# Patient Record
Sex: Female | Born: 1950 | Race: White | Hispanic: No | Marital: Married | Smoking: Never smoker
Health system: Southern US, Community
[De-identification: ages and names within clinical notes are randomized; demographics above are authoritative.]

## PROBLEM LIST (undated history)

## (undated) DIAGNOSIS — E785 Hyperlipidemia, unspecified: Secondary | ICD-10-CM

## (undated) DIAGNOSIS — E039 Hypothyroidism, unspecified: Secondary | ICD-10-CM

## (undated) DIAGNOSIS — I10 Essential (primary) hypertension: Secondary | ICD-10-CM

## (undated) DIAGNOSIS — N8500 Endometrial hyperplasia, unspecified: Secondary | ICD-10-CM

## (undated) DIAGNOSIS — K649 Unspecified hemorrhoids: Secondary | ICD-10-CM

## (undated) DIAGNOSIS — K222 Esophageal obstruction: Secondary | ICD-10-CM

## (undated) DIAGNOSIS — G4733 Obstructive sleep apnea (adult) (pediatric): Secondary | ICD-10-CM

## (undated) DIAGNOSIS — J45909 Unspecified asthma, uncomplicated: Secondary | ICD-10-CM

## (undated) DIAGNOSIS — E559 Vitamin D deficiency, unspecified: Secondary | ICD-10-CM

## (undated) DIAGNOSIS — K5790 Diverticulosis of intestine, part unspecified, without perforation or abscess without bleeding: Secondary | ICD-10-CM

## (undated) DIAGNOSIS — M25559 Pain in unspecified hip: Secondary | ICD-10-CM

## (undated) DIAGNOSIS — M1990 Unspecified osteoarthritis, unspecified site: Secondary | ICD-10-CM

## (undated) DIAGNOSIS — Z9889 Other specified postprocedural states: Secondary | ICD-10-CM

## (undated) DIAGNOSIS — M25569 Pain in unspecified knee: Secondary | ICD-10-CM

## (undated) DIAGNOSIS — K2970 Gastritis, unspecified, without bleeding: Secondary | ICD-10-CM

## (undated) DIAGNOSIS — E8881 Metabolic syndrome: Secondary | ICD-10-CM

## (undated) DIAGNOSIS — D497 Neoplasm of unspecified behavior of endocrine glands and other parts of nervous system: Secondary | ICD-10-CM

## (undated) DIAGNOSIS — K219 Gastro-esophageal reflux disease without esophagitis: Secondary | ICD-10-CM

## (undated) DIAGNOSIS — R0600 Dyspnea, unspecified: Secondary | ICD-10-CM

## (undated) DIAGNOSIS — M7750 Other enthesopathy of unspecified foot: Secondary | ICD-10-CM

## (undated) DIAGNOSIS — Z87442 Personal history of urinary calculi: Secondary | ICD-10-CM

## (undated) DIAGNOSIS — K449 Diaphragmatic hernia without obstruction or gangrene: Secondary | ICD-10-CM

## (undated) DIAGNOSIS — M79673 Pain in unspecified foot: Secondary | ICD-10-CM

## (undated) DIAGNOSIS — G971 Other reaction to spinal and lumbar puncture: Secondary | ICD-10-CM

## (undated) DIAGNOSIS — D131 Benign neoplasm of stomach: Secondary | ICD-10-CM

## (undated) DIAGNOSIS — R0902 Hypoxemia: Secondary | ICD-10-CM

## (undated) DIAGNOSIS — R112 Nausea with vomiting, unspecified: Secondary | ICD-10-CM

## (undated) HISTORY — DX: Gastritis, unspecified, without bleeding: K29.70

## (undated) HISTORY — DX: Diverticulosis of intestine, part unspecified, without perforation or abscess without bleeding: K57.90

## (undated) HISTORY — DX: Pain in unspecified foot: M79.673

## (undated) HISTORY — DX: Unspecified asthma, uncomplicated: J45.909

## (undated) HISTORY — DX: Neoplasm of unspecified behavior of endocrine glands and other parts of nervous system: D49.7

## (undated) HISTORY — DX: Essential (primary) hypertension: I10

## (undated) HISTORY — PX: ANAL FISSURE REPAIR: SHX2312

## (undated) HISTORY — DX: Unspecified hemorrhoids: K64.9

## (undated) HISTORY — DX: Pain in unspecified hip: M25.559

## (undated) HISTORY — DX: Pain in unspecified knee: M25.569

## (undated) HISTORY — DX: Benign neoplasm of stomach: D13.1

## (undated) HISTORY — DX: Esophageal obstruction: K22.2

## (undated) HISTORY — DX: Diaphragmatic hernia without obstruction or gangrene: K44.9

## (undated) HISTORY — PX: ESOPHAGEAL DILATION: SHX303

## (undated) HISTORY — DX: Endometrial hyperplasia, unspecified: N85.00

## (undated) HISTORY — DX: Obstructive sleep apnea (adult) (pediatric): G47.33

## (undated) HISTORY — DX: Vitamin D deficiency, unspecified: E55.9

## (undated) HISTORY — DX: Other enthesopathy of unspecified foot and ankle: M77.50

## (undated) HISTORY — DX: Metabolic syndrome: E88.810

## (undated) HISTORY — DX: Hyperlipidemia, unspecified: E78.5

## (undated) HISTORY — PX: ESOPHAGOGASTRODUODENOSCOPY: SHX1529

## (undated) HISTORY — DX: Metabolic syndrome: E88.81

## (undated) HISTORY — PX: EYE SURGERY: SHX253

## (undated) HISTORY — DX: Hypoxemia: R09.02

## (undated) HISTORY — DX: Other specified postprocedural states: Z98.890

---

## 1998-04-13 HISTORY — PX: KNEE CARTILAGE SURGERY: SHX688

## 1998-05-05 ENCOUNTER — Encounter: Admission: RE | Admit: 1998-05-05 | Discharge: 1998-08-03 | Payer: Self-pay | Admitting: Orthopedic Surgery

## 1999-03-16 ENCOUNTER — Other Ambulatory Visit: Admission: RE | Admit: 1999-03-16 | Discharge: 1999-03-16 | Payer: Self-pay | Admitting: Obstetrics and Gynecology

## 1999-03-20 ENCOUNTER — Encounter: Payer: Self-pay | Admitting: Obstetrics and Gynecology

## 1999-03-21 ENCOUNTER — Ambulatory Visit (HOSPITAL_COMMUNITY): Admission: RE | Admit: 1999-03-21 | Discharge: 1999-03-21 | Payer: Self-pay | Admitting: Obstetrics and Gynecology

## 1999-03-21 ENCOUNTER — Encounter (INDEPENDENT_AMBULATORY_CARE_PROVIDER_SITE_OTHER): Payer: Self-pay | Admitting: Specialist

## 1999-12-19 ENCOUNTER — Encounter: Payer: Self-pay | Admitting: Obstetrics and Gynecology

## 1999-12-19 ENCOUNTER — Ambulatory Visit (HOSPITAL_COMMUNITY): Admission: RE | Admit: 1999-12-19 | Discharge: 1999-12-19 | Payer: Self-pay | Admitting: Obstetrics and Gynecology

## 2000-04-02 ENCOUNTER — Encounter (INDEPENDENT_AMBULATORY_CARE_PROVIDER_SITE_OTHER): Payer: Self-pay

## 2000-04-02 ENCOUNTER — Ambulatory Visit (HOSPITAL_COMMUNITY): Admission: RE | Admit: 2000-04-02 | Discharge: 2000-04-02 | Payer: Self-pay | Admitting: Obstetrics and Gynecology

## 2000-06-25 ENCOUNTER — Encounter: Payer: Self-pay | Admitting: Obstetrics and Gynecology

## 2000-06-25 ENCOUNTER — Encounter: Admission: RE | Admit: 2000-06-25 | Discharge: 2000-06-25 | Payer: Self-pay | Admitting: Obstetrics and Gynecology

## 2001-05-22 ENCOUNTER — Other Ambulatory Visit: Admission: RE | Admit: 2001-05-22 | Discharge: 2001-05-22 | Payer: Self-pay | Admitting: Obstetrics and Gynecology

## 2002-04-14 ENCOUNTER — Ambulatory Visit (HOSPITAL_COMMUNITY): Admission: RE | Admit: 2002-04-14 | Discharge: 2002-04-14 | Payer: Self-pay | Admitting: Internal Medicine

## 2004-02-02 ENCOUNTER — Other Ambulatory Visit: Admission: RE | Admit: 2004-02-02 | Discharge: 2004-02-02 | Payer: Self-pay | Admitting: Obstetrics and Gynecology

## 2005-11-20 ENCOUNTER — Encounter: Admission: RE | Admit: 2005-11-20 | Discharge: 2005-11-20 | Payer: Self-pay | Admitting: General Surgery

## 2006-03-05 ENCOUNTER — Other Ambulatory Visit: Admission: RE | Admit: 2006-03-05 | Discharge: 2006-03-05 | Payer: Self-pay | Admitting: Obstetrics and Gynecology

## 2006-05-20 ENCOUNTER — Ambulatory Visit (HOSPITAL_BASED_OUTPATIENT_CLINIC_OR_DEPARTMENT_OTHER): Admission: RE | Admit: 2006-05-20 | Discharge: 2006-05-20 | Payer: Self-pay | Admitting: Surgery

## 2006-05-20 HISTORY — PX: UMBILICAL HERNIA REPAIR: SHX196

## 2007-09-11 ENCOUNTER — Encounter: Admission: RE | Admit: 2007-09-11 | Discharge: 2007-09-11 | Payer: Self-pay | Admitting: Orthopedic Surgery

## 2007-12-03 ENCOUNTER — Ambulatory Visit: Payer: Self-pay | Admitting: Cardiology

## 2007-12-10 ENCOUNTER — Telehealth: Payer: Self-pay | Admitting: Internal Medicine

## 2007-12-10 ENCOUNTER — Ambulatory Visit: Payer: Self-pay | Admitting: Internal Medicine

## 2007-12-12 HISTORY — PX: CATARACT EXTRACTION: SUR2

## 2007-12-12 LAB — HM COLONOSCOPY

## 2007-12-26 ENCOUNTER — Ambulatory Visit: Payer: Self-pay | Admitting: Internal Medicine

## 2007-12-26 HISTORY — PX: COLONOSCOPY: SHX174

## 2008-02-25 ENCOUNTER — Telehealth: Payer: Self-pay | Admitting: Internal Medicine

## 2008-03-03 ENCOUNTER — Telehealth: Payer: Self-pay | Admitting: Internal Medicine

## 2008-04-28 ENCOUNTER — Ambulatory Visit: Payer: Self-pay | Admitting: Internal Medicine

## 2008-05-12 ENCOUNTER — Telehealth: Payer: Self-pay | Admitting: Internal Medicine

## 2008-05-13 ENCOUNTER — Ambulatory Visit: Payer: Self-pay | Admitting: Internal Medicine

## 2008-12-28 ENCOUNTER — Telehealth: Payer: Self-pay | Admitting: Internal Medicine

## 2009-01-04 ENCOUNTER — Telehealth: Payer: Self-pay | Admitting: Internal Medicine

## 2009-08-09 ENCOUNTER — Telehealth: Payer: Self-pay | Admitting: Internal Medicine

## 2009-09-12 ENCOUNTER — Encounter (INDEPENDENT_AMBULATORY_CARE_PROVIDER_SITE_OTHER): Payer: Self-pay | Admitting: *Deleted

## 2009-09-16 ENCOUNTER — Telehealth: Payer: Self-pay | Admitting: Internal Medicine

## 2009-10-03 ENCOUNTER — Ambulatory Visit: Payer: Self-pay | Admitting: Internal Medicine

## 2009-10-03 DIAGNOSIS — K625 Hemorrhage of anus and rectum: Secondary | ICD-10-CM

## 2009-10-03 DIAGNOSIS — K219 Gastro-esophageal reflux disease without esophagitis: Secondary | ICD-10-CM

## 2009-11-15 ENCOUNTER — Telehealth (INDEPENDENT_AMBULATORY_CARE_PROVIDER_SITE_OTHER): Payer: Self-pay | Admitting: *Deleted

## 2009-11-21 ENCOUNTER — Telehealth: Payer: Self-pay | Admitting: Internal Medicine

## 2010-09-12 NOTE — Progress Notes (Signed)
Summary: med change  Phone Note Call from Patient Call back at [phone] (cell)   Caller: Patient Call For: Dr. Leone Payor Summary of Call: ins will not pay for Dexilant Initial call taken by: Vallarie Mare,  November 21, 2009 2:13 PM  Follow-up for Phone Call        Advised pt we have started prior auth on Dexilant, but we have not heard from ins. co.  I advised pt she could take Omeprazole OTC until that time.  If Dexilant is denied then we will call in Pantoprazole. Follow-up by: Francee Piccolo CMA Duncan Dull),  November 21, 2009 4:32 PM  Additional Follow-up for Phone Call Additional follow up Details #1::        pt's husband showed up at office requesting samples.  Dexilant samples given until answer on prior auth is received. Additional Follow-up by: Francee Piccolo CMA Duncan Dull),  November 22, 2009 9:26 AM    Additional Follow-up for Phone Call Additional follow up Details #2::    ok - will the discount card help? - probably not w/o prior auth Follow-up by: Iva Boop MD, Clementeen Graham,  November 22, 2009 10:31 AM  New/Updated Medications: DEXILANT 60 MG CPDR (DEXLANSOPRAZOLE) 1 by mouth 30-60 minutes before breakfast  Appended Document: med change I spoke to [place] at [employer] regarding prior auth.  Pt's plan has a step generic therapy now.  Pt must try and fail one of the preferred generics before any brand will be covered.  30-day rx must be sent to pharmacy and a brand willl be covered 30 days later with no PA needed.  Per Albin Felling, Omeprazole 40mg  covered with $8copay.  I spoke to pt and she agrees with trying omeprazole. This will be sent to [facility].  I will flag myself to check with pt in 30 days to see if she wishes to continue omeprazole or go back to Dexilant.   Clinical Lists Changes  Medications: Changed medication from DEXILANT 60 MG CPDR (DEXLANSOPRAZOLE) 1 by mouth 30-60 minutes before breakfast to OMEPRAZOLE 40 MG CPDR (OMEPRAZOLE) Take 1 capsule by mouth once a day 30  minutes before breakfast - Signed Rx of OMEPRAZOLE 40 MG CPDR (OMEPRAZOLE) Take 1 capsule by mouth once a day 30 minutes before breakfast;  #30 x 0;  Signed;  Entered by: Francee Piccolo CMA (AAMA);  Authorized by: Iva Boop MD, [facility];  Method used: Electronically to [facility] [phone]*, [address], [place], [place], [place]  [address], Ph: [phone] or [phone], Fax: [phone]    Prescriptions: OMEPRAZOLE 40 MG CPDR (OMEPRAZOLE) Take 1 capsule by mouth once a day 30 minutes before breakfast  #30 x 0   Entered by:   Francee Piccolo CMA (AAMA)   Authorized by:   Iva Boop MD, [facility]   Signed by:   Francee Piccolo CMA (AAMA) on 11/22/2009   Method used:   Electronically to        [facility] [phone]* (retail)       [address] [place], [place]  [address]       Ph: [phone] or [phone]       Fax: [phone]   RxID:   [id number]    Appended Document: med change message left for pt to return call.  Appended Document: med change I spoke to pt and she is doing well on omeprazole.  She requests a 90  day supply to be called in to [facility].  Clinical Lists Changes  Medications: Rx of OMEPRAZOLE 40 MG CPDR (OMEPRAZOLE) Take 1 capsule by mouth once a day 30 minutes before breakfast;  #90 x 3;  Signed;  Entered by: Francee Piccolo CMA (AAMA);  Authorized by: Iva Boop MD, [facility];  Method used: Faxed to [facility], [address], [place], [place]  [address], Ph: [phone], Fax: [phone]    Prescriptions: OMEPRAZOLE 40 MG CPDR (OMEPRAZOLE) Take 1 capsule by mouth once a day 30 minutes before breakfast  #90 x 3   Entered by:   Francee Piccolo CMA (AAMA)   Authorized by:   Iva Boop MD, [facility]   Signed by:   Francee Piccolo CMA (AAMA) on 12/20/2009   Method used:   Faxed to ...       [facility]       [address] [place], [place]  [address]       Ph: [phone]        Fax: [phone]   RxID:   [id number]

## 2010-09-12 NOTE — Letter (Signed)
Summary: New Patient letter  [facility]  [address] [place], [place] [address]   Phone: [phone]  Fax: [phone]       09/12/2009 MRN: [id number]  [facility] [address] [place], [place]  [address]  Dear Stacie Russell,  Welcome to the Gastroenterology Division at [facility].    You are scheduled to see Dr. Leone Payor on 10-31-09 at 8:45a.m. on the 3rd floor at [facility], [address]  We ask that you try to arrive at our office 15 minutes prior to your appointment time to allow for check-in.  We would like you to complete the enclosed self-administered evaluation form prior to your visit and bring it with you on the day of your appointment.  We will review it with you.  Also, please bring a complete list of all your medications or, if you prefer, bring the medication bottles and we will list them.  Please bring your insurance card so that we may make a copy of it.  If your insurance requires a referral to see a specialist, please bring your referral form from your primary care physician.  Co-payments are due at the time of your visit and may be paid by cash, check or credit card.     Your office visit will consist of a consult with your physician (includes a physical exam), any laboratory testing he/she may order, scheduling of any necessary diagnostic testing (e.g. x-ray, ultrasound, CT-scan), and scheduling of a procedure (e.g. Endoscopy, Colonoscopy) if required.  Please allow enough time on your schedule to allow for any/all of these possibilities.    If you cannot keep your appointment, please call [phone] to cancel or reschedule prior to your appointment date.  This allows [place] the opportunity to schedule an appointment for another patient in need of care.  If you do not cancel or reschedule by 5 p.m. the business day prior to your appointment date, you will be charged a $50.00 late cancellation/no-show fee.    Thank you for choosing  [facility] for your medical needs.  We appreciate the opportunity to care for you.  Please visit [place] at our website  to learn more about our practice.                     Sincerely,                                                             The Gastroenterology Division

## 2010-09-12 NOTE — Progress Notes (Signed)
Summary: Having Gi problems  Phone Note Call from Patient Call back at [phone]   Call For: Dr Leone Payor Reason for Call: Talk to Nurse Summary of Call: Is on Nexium but she is unsure if it's just not working as well or what but she is still havibng trouble. Initial call taken by: Leanor Kail [facility],  September 16, 2009 1:53 PM  Follow-up for Phone Call        Patient is having reflux and pressure after meals.  Patient has been taking nexium after meals.  I have advised her to change nexium to ac meals and that she may increase to two times a day if needed.  I have also rescheduled her appointment with Dr Leone Payor to 10-03-09 at 9;30  Follow-up by: Darcey Nora RN, CGRN,  September 16, 2009 2:58 PM

## 2010-09-12 NOTE — Assessment & Plan Note (Signed)
Summary: f/u--medication refills--ch.   History of Present Illness Visit Type: Initial Visit Primary GI MD: Stan Head MD [facility] Primary Provider: Rudi Heap, MD Chief Complaint: Pt. c/o acid reflux, dysphagia, hemorrhoids with rectal bleeding, and changes in bowels. History of Present Illness:   60 yo white woman with GERD. 11 or 07/2009 alot of chest/esophageal pain pc and soups, stew were especially bad. "Would set me on fire. Also felt tightness from throat to top of stomach after eating lijke food was sitting there. Since Nexium at night added after phone call she is better significantly. she notes there is alot of burping and gas in AM. Had not stopped . Taking it it before breakfast and bedtime. Past 6 weeks has had flu and then another respiratory ailment. Icreased use Proventil > albuteroloof inhalers for asthma/bronchitis.  High pillow but bed not elevated. In past 6 weeks has been weak and sick so eating at night before bed. 1 cup decaff coffee - no caffeine Using a tablet with some codeine recentl (not on list)  Stools alternating, hard bowel movement and red blood in toilet and on paper. Chronic recurret, rare.  weight 216 was 231 in 4/09, gained 5# lately with illness   GI Review of Systems    Reports acid reflux, belching, bloating, chest pain, and  dysphagia with solids.      Denies abdominal pain, dysphagia with liquids, heartburn, loss of appetite, nausea, vomiting, vomiting blood, weight loss, and  weight gain.      Reports change in bowel habits, constipation, diarrhea, hemorrhoids, and  rectal bleeding.     Denies anal fissure, black tarry stools, diverticulosis, fecal incontinence, heme positive stool, irritable bowel syndrome, jaundice, light color stool, liver problems, and  rectal pain. Preventive Screening-Counseling & Management  Alcohol-Tobacco     Smoking Status: never      Drug Use:  no.      Current Medications (verified): 1)  Nexium 40 Mg   Cpdr (Esomeprazole Magnesium) .Marland Kitchen.. 1 Capsule Two Times A Day 2)  Benicar Hct 40-25 Mg Tabs (Olmesartan Medoxomil-Hctz) .Marland Kitchen.. 1 Tablet By Mouth Once Daily 3)  Meloxicam 7.5 Mg Tabs (Meloxicam) .Marland Kitchen.. 1 Tablet By Mouth Once Daily 4)  Toviaz 8 Mg Xr24h-Tab (Fesoterodine Fumarate) .Marland Kitchen.. 1 Tablet By Mouth Once Daily 5)  Aspirin 81 Mg Tbec (Aspirin) .Marland Kitchen.. 1 Tablet By Mouth Once Daily 6)  Fish Oil 1000 Mg Caps (Omega-3 Fatty Acids) .... 2 Tablets By Mouth Once Daily 7)  Vitamin D 1000 Unit Tabs (Cholecalciferol) .Marland Kitchen.. 1 Tablet By Mouth Once Daily 8)  Proventil Hfa 108 (90 Base) Mcg/act Aers (Albuterol Sulfate) .... 2 Puffs Every 4 Hours Use As Needed  Allergies (verified): No Known Drug Allergies  Past History:  Past Medical History: Hypertension Hyperlipidemia Asthma Kidney Stones Obesity GERD - erosive esophagitis  Past Surgical History: Reviewed history from 09/29/2009 and no changes required. Hernia Surgery Cataract Extraction-Bilateral Knee Arthroscopy-Right  Family History: Reviewed history from 09/29/2009 and no changes required. Family History of Heart Disease: Father-MI deceased at 50 Family History of Breast Cancer: Sister Family History of Diabetes: Sister Family History of Colon Cancer: Mother, deceased at 72- mets to liver  Social History: Reviewed history from 09/29/2009 and no changes required. Married, 2 children House wife Patient has never smoked.  Illicit Drug Use - no Daily Caffeine Use Smoking Status:  never Drug Use:  no  Review of Systems       see HPI  Vital Signs:  Patient profile:  60 year old female Height:      62 inches Weight:      216.25 pounds BMI:     39.70 Pulse rate:   72 / minute Pulse rhythm:   regular BP sitting:   122 / 82  (left arm)  Vitals Entered By: Milford Cage NCMA (October 03, 2009 9:35 AM)  Physical Exam  General:  obese.  NAD Eyes:  anicteric Mouth:  pharnyx clear Lungs:  Clear throughout to auscultation. Heart:   Regular rate and rhythm; no murmurs, rubs,  or bruits. Abdomen:  obese, soft, NT no HSM BS+ Rectal:  female staff present visible hemorrhoids, brown stool, mild stenosis   Impression & Recommendations:  Problem # 1:  GERD (ICD-530.81) Assessment Deteriorated This is actually imroving since Nexium two times a day started so will observe further. Going to try Dexilant 60 mg daily as an alternative to Nexium 40 mg two times a day.  Problem # 2:  DYSPHAGIA (WUX-324.40) Assessment: Deteriorated ? of vague recurrence we discussed and will see what happens with dexilant. She has had a ring/stricture dilated previously with success and may need again, pending review of response to Dxilant.  Problem # 3:  RECTAL BLEEDING (ICD-569.3) Assessment: Comment Only Chronic and recurrent. Hemorrhods seen on exam and relatively recent colonoscopy was otherwise ok so she will increase fiber in diet.  Patient Instructions: 1)  Use Proventil only, it is same medication as albuterol 2)  Try Dexilant 60 mg daily instead of the Nexium 3)  Hemorrhoids, High Fiber Diet brochures given.  4)  Continue with weight loss efforts. 5)  Dexilant samples given to be used as directed below.  Call our office when you start last pack with an update on symptoms.  Be sure to monitor your swallowing and let us know how you are doing. 6)  Follow up with Dr. Christell Constant about your Bronchitis and Asthma.  An appointment this week would be optimal. 7)  Copy sent to : Rudi Heap, MD 8)  The medication list was reviewed and reconciled.  All changed / newly prescribed medications were explained.  A complete medication list was provided to the patient / caregiver.

## 2010-09-12 NOTE — Progress Notes (Signed)
Summary: RX for dexilant  Phone Note Outgoing Call   Call placed by: Francee Piccolo CMA Duncan Dull),  November 15, 2009 2:56 PM Summary of Call: Faxed request for refill for Nexium.  I spoke to pt and asked her how she was doing on Dexilant.  She says she is doing well.  We will call in Dexilant rx instead of Nexium.  Advised pt this would probably require precert and it may take a few extra days for her to receive her meds.  Pt is agreeable. Initial call taken by: Francee Piccolo CMA Duncan Dull),  November 15, 2009 2:58 PM    New/Updated Medications: DEXILANT 60 MG CPDR (DEXLANSOPRAZOLE) 1 by mouth 30-60 minutes before breakfast Prescriptions: DEXILANT 60 MG CPDR (DEXLANSOPRAZOLE) 1 by mouth 30-60 minutes before breakfast  #90 x 3   Entered by:   Francee Piccolo CMA (AAMA)   Authorized by:   Iva Boop MD, [facility]   Signed by:   Francee Piccolo CMA (AAMA) on 11/15/2009   Method used:   Faxed to ...       [facility]       [address] [place], [place]  [address]       Ph: [phone]       Fax: [phone]   RxID:   [id number]

## 2010-12-26 NOTE — Assessment & Plan Note (Signed)
[facility]                            CARDIOLOGY OFFICE NOTE   NAME:Stacie Russell, Stacie Russell                    MRN:          [id number]  DATE:12/03/2007                            DOB:          [birth year]    PRIMARY:  Dr. Vernon Prey.   REASON PRESENTATION:  Evaluate the patient with chest pain and multiple  cardiovascular risk factors.   HISTORY OF PRESENT ILLNESS:  The patient is a lovely 60 year old white  female without prior cardiac history.  She has significant  cardiovascular risk factors, however.  In late February she had no chest  discomfort.  She had this for few weeks before she presented for  evaluation.  She described as a fullness in her midchest.  It would  happen sporadically.  She would feel like she had to burp.  It would  sometimes last 10-15 minutes.  It was moderate in intensity.  It would  go away spontaneously.  Would come on at rest.  She had not had  discomfort like this before.  There was no radiation to her neck or to  her arms.  There was no associated nausea, vomiting or diaphoresis.  She  would not be particular short of breath with it.  She was having  palpitations, presyncope, syncope.  She was treated with Zegerid and  actually he has had improvement since getting those samples.  She  currently is not having any of this discomfort.  Of note, she is having  some dysphagia.  This is similar to when she had her esophagus stretched  in the past.  She is having this evaluated again.   PAST MEDICAL HISTORY:  Hypertension x 20 years, hyperlipidemia (see she  is trying diet control).   PAST SURGICAL HISTORY:  Umbilical hernia repair, cataract surgery.   ALLERGIES:  None.   MEDICATIONS:  Aspirin 81 mg daily, meloxicam 15 mg daily, Detrol,  Benicar hydrochlorothiazide 40/25, bupropion 300 mg daily.   SOCIAL HISTORY:  The patient is self-employed.  She is married.  She has  two children.  She has never smoked cigarettes.  She  does not drink  alcohol.   FAMILY HISTORY:  Is contributory for father dying of myocardial  infarction at 66.  Negative for other first-degree relatives with early  heart disease.   REVIEW OF SYSTEMS:  As stated in HPI and otherwise negative for other  systems.   PHYSICAL EXAMINATION:  The patient is in no distress.  Blood pressure 120/80, heart rate 80 and regular, weight 231 pounds,  body mass index 42.  HEENT:  Eyelids unremarkable.  Pupils are equal, round, and reactive to  light and accommodation.  Fundi within normal limits.  Oral mucosa  unremarkable.  NECK:  No jugular venous distension at 45 degrees, carotid upstroke  brisk and symmetric, no bruits, thyromegaly.  LYMPHATICS:  No cervical, axillary, or inguinal adenopathy.  LUNGS:  Clear to auscultation bilaterally.  BACK:  No costovertebral angle tenderness.  CHEST:  Unremarkable.  HEART:  PMI not displaced or sustained, S1 and S2 within normal limits,  no S3, no S4, no clicks,  rubs, murmurs.  ABDOMEN:  Obese, positive bowel sounds, normal in frequency and pitch,  no bruits, rebound, guarding.  No midline pulsatile masses,  hepatomegaly, splenomegaly.  SKIN:  No rashes, no nodules.  EXTREMITIES:  With 2+ pulses throughout, no edema, cyanosis, clubbing.  NEURO:  Oriented to person, place, and time, cranial nerves 2-12 grossly  intact, motor grossly intact.    EKG sinus rhythm, rate 73, axis within normal limits, intervals within  normal limits, no acute ST wave change.   ASSESSMENT/PLAN:  1. Chest discomfort, the patient's chest discomfort is quite atypical      for angina.  Got better with Zegerid.  She had a negative stress      test a couple of years ago with Dr. Christell Constant.  The EKG is normal.  The      physical exam is unremarkable.  Given this, I think the pretest      probability of obstructive coronary disease is very low.      Therefore, I do not think further cardiovascular testing is      suggested.  2.  Dyslipidemia.  We discussed this.  She wants diet control to see if      she can have a better profile.  I have encouraged her to get a      followup now that it has been several months.  She will do this      with Dr. Christell Constant.  I think the goal should be an LDL less than 100,      HDL greater than a 50 given her family history.  3. Hypertension.  Blood pressure is well-controlled.  She will      continue medications as listed.  4. Obesity.  We had a long discussion about this.  She lost weight      years ago with exercise and diet and thinks she can do it again.  I      have encouraged the [facility] Diet.  She actually has this book      at home.  5. Followup.  Will see the patient back as needed.     Rollene Rotunda, MD, [facility]  Electronically Signed    JH/MedQ  DD: 12/03/2007  DT: 12/03/2007  Job #: [id number]   cc:   Ernestina Penna, M.D.

## 2010-12-29 NOTE — Op Note (Signed)
Stacie Russell, Stacie Russell             ACCOUNT NO.:  [id number]   MEDICAL RECORD NO.:  [id number]          PATIENT TYPE:  AMB   LOCATION:  DSC                          FACILITY:  [facility]   PHYSICIAN:  Sandria Bales. Ezzard Standing, M.D.  DATE OF BIRTH:  12-13-1950   DATE OF PROCEDURE:  05/20/2006  DATE OF DISCHARGE:                                 OPERATIVE REPORT   PREOPERATIVE DIAGNOSIS:  Umbilical hernia.   POSTOPERATIVE DIAGNOSIS:  Umbilical hernia (approximately 2 cm defect).   PROCEDURE:  Open umbilical hernia repair.   SURGEON:  Sandria Bales. Ezzard Standing, M.D.   FIRST ASSISTANT:  None.   ANESTHESIA:  General LMA with approximately 25 mL of 0.25% Marcaine.   COMPLICATIONS:  None.   INDICATIONS FOR PROCEDURE:  [name] is a 60 year old white female  patient of Dr. Vernon Prey, who has an umbilical hernia, which he has had for  a number of years.  It has become increasingly symptomatic and not wants to  have this hernia repaired.   The indication and potential complications were explained to the patient.  Particular complications include but are not limited to bleeding, infection  and recurrence of the hernia.   OPERATIVE NOTE:  With the patient in the supine position, her abdomen was  prepped with Betadine solution and sterilely draped.  A timeout was held  confirming patient and procedure.     Then made a smiley face infraumbilical incision with sharp dissection  carried down to the rectus abdominis fascia.  Sharp dissection was then  carried out identifying a hernia sac which protruded probably some 7-8 cm  but the defect in the fascia was only about 2 cm.  I was able to isolate the  sac and invert the entire sac.  I then freshened up the fascial edges and I  closed the hernia defect with interrupted  0 Novofil sutures using about six  or seven of these.  I then tacked the umbilical skin, came back down to the  fascia using 3-0 Vicryl sutures, closed the subcutaneous tissues with 3-0  Vicryl  sutures and the skin with a 5-0 Vicryl suture.   The incision was closed with benzoin and Steri-Strips.  The patient  tolerated the procedure well.  Sponge and needle count were correct at the  end of the case.  Was transported to the recovery room in good condition.  She will have an abdominal binder when she leaves.   She will see me back in two weeks for followup.  Wound check invitation was  Octavio Manns.      Sandria Bales. Ezzard Standing, M.D.  Electronically Signed     DHN/MEDQ  D:  05/20/2006  T:  05/21/2006  Job:  [id number]   cc:   Ernestina Penna, M.D.

## 2011-02-13 ENCOUNTER — Other Ambulatory Visit (HOSPITAL_COMMUNITY): Payer: Self-pay | Admitting: Orthopaedic Surgery

## 2011-02-13 DIAGNOSIS — M25569 Pain in unspecified knee: Secondary | ICD-10-CM

## 2011-02-16 ENCOUNTER — Ambulatory Visit (HOSPITAL_COMMUNITY)
Admission: RE | Admit: 2011-02-16 | Discharge: 2011-02-16 | Disposition: A | Discharge status: Home / Self Care | Payer: 59 | Source: Ambulatory Visit | Attending: Orthopaedic Surgery | Admitting: Orthopaedic Surgery

## 2011-02-16 DIAGNOSIS — M23329 Other meniscus derangements, posterior horn of medial meniscus, unspecified knee: Secondary | ICD-10-CM | POA: Insufficient documentation

## 2011-02-16 DIAGNOSIS — M25569 Pain in unspecified knee: Secondary | ICD-10-CM | POA: Insufficient documentation

## 2011-02-16 DIAGNOSIS — M7120 Synovial cyst of popliteal space [Baker], unspecified knee: Secondary | ICD-10-CM | POA: Insufficient documentation

## 2011-06-28 LAB — FECAL OCCULT BLOOD, GUAIAC: Fecal Occult Blood: NEGATIVE

## 2012-02-11 LAB — HM PAP SMEAR

## 2012-07-24 LAB — CBC AND DIFFERENTIAL
Hemoglobin: 14.2 g/dL (ref 12.0–16.0)
Platelets: 284 10*3/uL (ref 150–399)
WBC: 7.7 10^3/mL

## 2012-07-24 LAB — HEPATIC FUNCTION PANEL
ALT: 24 U/L (ref 7–35)
AST: 17 U/L (ref 13–35)
Alkaline Phosphatase: 73 U/L (ref 25–125)
Bilirubin, Direct: 0.1 mg/dL

## 2012-07-24 LAB — LIPID PANEL: LDL Cholesterol: 101 mg/dL

## 2012-07-24 LAB — BASIC METABOLIC PANEL: Glucose: 81 mg/dL

## 2012-10-15 ENCOUNTER — Encounter: Payer: Self-pay | Admitting: *Deleted

## 2012-10-15 DIAGNOSIS — K5790 Diverticulosis of intestine, part unspecified, without perforation or abscess without bleeding: Secondary | ICD-10-CM | POA: Insufficient documentation

## 2012-10-15 DIAGNOSIS — K649 Unspecified hemorrhoids: Secondary | ICD-10-CM | POA: Insufficient documentation

## 2012-10-15 DIAGNOSIS — M25569 Pain in unspecified knee: Secondary | ICD-10-CM | POA: Insufficient documentation

## 2012-10-15 DIAGNOSIS — M7750 Other enthesopathy of unspecified foot: Secondary | ICD-10-CM | POA: Insufficient documentation

## 2012-10-15 DIAGNOSIS — K222 Esophageal obstruction: Secondary | ICD-10-CM | POA: Insufficient documentation

## 2012-10-15 DIAGNOSIS — K2970 Gastritis, unspecified, without bleeding: Secondary | ICD-10-CM | POA: Insufficient documentation

## 2012-10-15 DIAGNOSIS — E782 Mixed hyperlipidemia: Secondary | ICD-10-CM | POA: Insufficient documentation

## 2012-10-15 DIAGNOSIS — M79673 Pain in unspecified foot: Secondary | ICD-10-CM | POA: Insufficient documentation

## 2012-10-15 DIAGNOSIS — J45909 Unspecified asthma, uncomplicated: Secondary | ICD-10-CM | POA: Insufficient documentation

## 2012-10-15 DIAGNOSIS — K449 Diaphragmatic hernia without obstruction or gangrene: Secondary | ICD-10-CM | POA: Insufficient documentation

## 2012-10-15 DIAGNOSIS — M25559 Pain in unspecified hip: Secondary | ICD-10-CM | POA: Insufficient documentation

## 2012-10-15 DIAGNOSIS — N8500 Endometrial hyperplasia, unspecified: Secondary | ICD-10-CM | POA: Insufficient documentation

## 2012-11-03 ENCOUNTER — Encounter: Payer: Self-pay | Admitting: Family Medicine

## 2012-11-03 ENCOUNTER — Ambulatory Visit (INDEPENDENT_AMBULATORY_CARE_PROVIDER_SITE_OTHER): Payer: BC Managed Care – PPO | Admitting: Family Medicine

## 2012-11-03 ENCOUNTER — Other Ambulatory Visit: Payer: BC Managed Care – PPO

## 2012-11-03 VITALS — BP 139/73 | HR 77 | Temp 97.9°F | Ht 61.0 in | Wt 236.0 lb

## 2012-11-03 DIAGNOSIS — E559 Vitamin D deficiency, unspecified: Secondary | ICD-10-CM

## 2012-11-03 DIAGNOSIS — K2970 Gastritis, unspecified, without bleeding: Secondary | ICD-10-CM

## 2012-11-03 DIAGNOSIS — K2990 Gastroduodenitis, unspecified, without bleeding: Secondary | ICD-10-CM

## 2012-11-03 DIAGNOSIS — E785 Hyperlipidemia, unspecified: Secondary | ICD-10-CM

## 2012-11-03 DIAGNOSIS — I10 Essential (primary) hypertension: Secondary | ICD-10-CM | POA: Insufficient documentation

## 2012-11-03 DIAGNOSIS — H6120 Impacted cerumen, unspecified ear: Secondary | ICD-10-CM

## 2012-11-03 DIAGNOSIS — R5383 Other fatigue: Secondary | ICD-10-CM

## 2012-11-03 DIAGNOSIS — M25561 Pain in right knee: Secondary | ICD-10-CM

## 2012-11-03 DIAGNOSIS — H6122 Impacted cerumen, left ear: Secondary | ICD-10-CM

## 2012-11-03 DIAGNOSIS — K449 Diaphragmatic hernia without obstruction or gangrene: Secondary | ICD-10-CM

## 2012-11-03 DIAGNOSIS — K219 Gastro-esophageal reflux disease without esophagitis: Secondary | ICD-10-CM

## 2012-11-03 DIAGNOSIS — M25569 Pain in unspecified knee: Secondary | ICD-10-CM

## 2012-11-03 LAB — POCT CBC
Granulocyte percent: 62.9 %G (ref 37–80)
HCT, POC: 42.7 % (ref 37.7–47.9)
Lymph, poc: 2.2 (ref 0.6–3.4)
MCV: 90.7 fL (ref 80–97)
Platelet Count, POC: 223 10*3/uL (ref 142–424)
RBC: 4.7 M/uL (ref 4.04–5.48)

## 2012-11-03 LAB — HEPATIC FUNCTION PANEL
AST: 15 U/L (ref 0–37)
Albumin: 4.2 g/dL (ref 3.5–5.2)
Alkaline Phosphatase: 79 U/L (ref 39–117)
Bilirubin, Direct: 0.1 mg/dL (ref 0.0–0.3)
Total Bilirubin: 0.5 mg/dL (ref 0.3–1.2)

## 2012-11-03 LAB — BASIC METABOLIC PANEL
BUN: 15 mg/dL (ref 6–23)
Chloride: 104 mEq/L (ref 96–112)
Potassium: 4.1 mEq/L (ref 3.5–5.3)

## 2012-11-03 MED ORDER — OMEPRAZOLE 40 MG PO CPDR: 40.0000 mg | DELAYED_RELEASE_CAPSULE | Freq: Every day | ORAL | Status: DC

## 2012-11-03 MED ORDER — LOSARTAN POTASSIUM-HCTZ 50-12.5 MG PO TABS: 1.0000 | ORAL_TABLET | Freq: Every day | ORAL | Status: DC

## 2012-11-03 MED ORDER — MELOXICAM 15 MG PO TABS: 15.0000 mg | ORAL_TABLET | Freq: Every day | ORAL | Status: DC

## 2012-11-03 NOTE — Progress Notes (Signed)
  Subjective:    Patient ID: Stacie Russell, female    DOB: May 19, 1951, 62 y.o.   MRN: [id number]  HPI This patient presents for evaluation of her chronic medical problems..  No one accompanies the patient.  Medications and allergies reviewed.    Review of Systems  HENT: Positive for ear pain (Left) and postnasal drip (slight).   Eyes: Visual disturbance: recent laser surgery on left eye, betternow.  Respiratory: Negative.   Cardiovascular: Negative.   Gastrointestinal: Positive for blood in stool (occ).  Genitourinary: Positive for urgency and frequency (overactive bladder, in study now with Dr  Annabell Howells). Negative for vaginal bleeding and vaginal pain.  Musculoskeletal: Positive for back pain (LBP), arthralgias (all over, worse in hips) and gait problem (stiff at times and hard to walk). Negative for joint swelling.  Neurological: Negative.   Psychiatric/Behavioral: Negative.   All other systems reviewed and are negative.       Objective:   Physical Exam BP 139/73  Pulse 77  Temp(Src) 97.9 F (36.6 C) (Oral)  Ht 5\' 1"  (1.549 m)  Wt 236 lb (107.049 kg)  BMI 44.61 kg/m2  The patient appeared well nourished and normally developed, alert and oriented to time and place. Speech, behavior and judgement appear normal. Vital signs as documented.  Head exam is unremarkable. No scleral icterus or pallor noted. Small amount of wax in left ear canal. Also throat slightly red posteriorly. Neck is without jugular venous distension, thyromegally, or carotid bruits. Carotid upstrokes are brisk bilaterally. No cervical adenopathy. Lungs are clear anteriorly and posteriorly to auscultation. Normal respiratory effort. Cardiac exam reveals regular rate and rhythm of 84 per minute. First and second heart sounds normal. No murmurs, rubs or gallops.  Abdominal exam reveals normal bowl sounds, no masses, no organomegaly and no aortic enlargement. Slight epigastric tenderness. No inguinal  adenopathy. Extremities are nonedematous and both  pedal pulses are normal. Skin without pallor or jaundice.  Warm and dry, without rash. Neurologic exam reveals normal deep tendon reflexes and normal sensation.          Assessment & Plan:    1. Excessive cerumen in ear canal, left  - Ear cerumen removal  2. Hyperlipidemia   3. Hiatal hernia   4. Gastritis   5. Knee pain, right   6. GERD   7. Essential hypertension, benign   8. Unspecified vitamin D deficiency  Plan Lipids, vitamin D, BMP, and CBC done today Ear wash done before patient left office

## 2012-11-03 NOTE — Patient Instructions (Addendum)
Pt will bring back FOBT Continue current meds and therapeutic lifestyle changes Medications are being transferred to [facility] in [place] It's very important that she walk more frequently and watch her diet and try to lose weight

## 2012-11-05 ENCOUNTER — Other Ambulatory Visit: Payer: Self-pay | Admitting: *Deleted

## 2012-11-05 DIAGNOSIS — M25561 Pain in right knee: Secondary | ICD-10-CM

## 2012-11-05 MED ORDER — MELOXICAM 15 MG PO TABS: ORAL_TABLET | ORAL | Status: DC

## 2012-11-06 ENCOUNTER — Other Ambulatory Visit (INDEPENDENT_AMBULATORY_CARE_PROVIDER_SITE_OTHER): Payer: BC Managed Care – PPO

## 2012-11-06 DIAGNOSIS — Z1212 Encounter for screening for malignant neoplasm of rectum: Secondary | ICD-10-CM

## 2012-11-06 NOTE — Progress Notes (Unsigned)
Pt dropped off FOBT only 

## 2013-01-07 ENCOUNTER — Other Ambulatory Visit: Payer: Self-pay | Admitting: *Deleted

## 2013-01-07 DIAGNOSIS — M25561 Pain in right knee: Secondary | ICD-10-CM

## 2013-01-07 MED ORDER — OMEPRAZOLE 40 MG PO CPDR: 40.0000 mg | DELAYED_RELEASE_CAPSULE | Freq: Every day | ORAL | Status: DC

## 2013-01-07 MED ORDER — LOSARTAN POTASSIUM-HCTZ 50-12.5 MG PO TABS: 1.0000 | ORAL_TABLET | Freq: Every day | ORAL | Status: DC

## 2013-01-07 MED ORDER — MELOXICAM 15 MG PO TABS: ORAL_TABLET | ORAL | Status: DC

## 2013-01-26 ENCOUNTER — Encounter: Payer: Self-pay | Admitting: Internal Medicine

## 2013-02-26 ENCOUNTER — Other Ambulatory Visit: Payer: Self-pay | Admitting: *Deleted

## 2013-02-26 MED ORDER — LOSARTAN POTASSIUM-HCTZ 50-12.5 MG PO TABS: 1.0000 | ORAL_TABLET | Freq: Every day | ORAL | Status: DC

## 2013-03-17 ENCOUNTER — Other Ambulatory Visit: Payer: Self-pay | Admitting: *Deleted

## 2013-03-17 NOTE — Telephone Encounter (Signed)
Patient picked up omeprazole at pharmacy but only received 30 pills even though script written for 90.  Spoke with pharmacist and she stated that insurance will only cover 30 days.  Pt notified.

## 2013-05-06 ENCOUNTER — Ambulatory Visit (INDEPENDENT_AMBULATORY_CARE_PROVIDER_SITE_OTHER): Payer: BC Managed Care – PPO | Admitting: Family Medicine

## 2013-05-06 ENCOUNTER — Encounter: Payer: Self-pay | Admitting: Family Medicine

## 2013-05-06 VITALS — BP 131/79 | HR 68 | Temp 97.6°F | Ht 61.0 in | Wt 229.0 lb

## 2013-05-06 DIAGNOSIS — K219 Gastro-esophageal reflux disease without esophagitis: Secondary | ICD-10-CM

## 2013-05-06 DIAGNOSIS — I10 Essential (primary) hypertension: Secondary | ICD-10-CM

## 2013-05-06 DIAGNOSIS — E559 Vitamin D deficiency, unspecified: Secondary | ICD-10-CM

## 2013-05-06 DIAGNOSIS — Z23 Encounter for immunization: Secondary | ICD-10-CM

## 2013-05-06 DIAGNOSIS — E785 Hyperlipidemia, unspecified: Secondary | ICD-10-CM

## 2013-05-06 DIAGNOSIS — J45909 Unspecified asthma, uncomplicated: Secondary | ICD-10-CM

## 2013-05-06 LAB — POCT CBC
Lymph, poc: 2.1 (ref 0.6–3.4)
MCH, POC: 28.6 pg (ref 27–31.2)
MCV: 87.8 fL (ref 80–97)
Platelet Count, POC: 210 10*3/uL (ref 142–424)
RDW, POC: 13.4 %
WBC: 6.7 10*3/uL (ref 4.6–10.2)

## 2013-05-06 NOTE — Addendum Note (Signed)
Addended by: Magdalene River on: 05/06/2013 10:37 AM   Modules accepted: Orders

## 2013-05-06 NOTE — Patient Instructions (Signed)
We will arrange an appointment with a clinical pharmacist to discuss some weight loss medication options and to see if you are a candidate for these Continue aggressive therapeutic lifestyle changes Always be careful and did not put herself at risk for falling Get your flu shot today and Prevnar Drink plenty of fluids Continue to take medication as directed Take the probiotic samples given today one daily to see if this helps with gas pain

## 2013-05-06 NOTE — Progress Notes (Signed)
Subjective:    Patient ID: Stacie Russell, female    DOB: December 08, 1950, 62 y.o.   MRN: [id number]  HPI Pt here for follow up and management of chronic medical problems.  Patient Active Problem List   Diagnosis Date Noted  . Essential hypertension, benign 11/03/2012  . Unspecified vitamin D deficiency 11/03/2012  . Knee pain   . Endometrial hyperplasia   . Hemorrhoid   . Foot pain   . Esophageal stricture   . Diverticulosis   . Hip pain   . Gastritis   . Tendonitis of ankle   . Hiatal hernia   . Hyperlipidemia   . Asthma   . GERD 10/03/2009  . RECTAL BLEEDING 10/03/2009  . DYSPHAGIA 10/03/2009   Outpatient Encounter Prescriptions as of 05/06/2013  Medication Sig Dispense Refill  . albuterol (PROVENTIL HFA;VENTOLIN HFA) 108 (90 BASE) MCG/ACT inhaler Inhale 2 puffs into the lungs every 6 (six) hours as needed for wheezing.      Marland Kitchen aspirin 81 MG tablet Take 81 mg by mouth daily.      . budesonide-formoterol (SYMBICORT) 80-4.5 MCG/ACT inhaler Inhale 2 puffs into the lungs 2 (two) times daily.      . Cholecalciferol (VITAMIN D3) 3000 UNITS TABS Take 1 capsule by mouth daily.      . fesoterodine (TOVIAZ) 4 MG TB24 Take 4 mg by mouth 2 (two) times daily.      Marland Kitchen losartan-hydrochlorothiazide (HYZAAR) 50-12.5 MG per tablet Take 1 tablet by mouth daily.  90 tablet  2  . meloxicam (MOBIC) 15 MG tablet Take as directed  90 tablet  2  . Multiple Vitamin (MULTIVITAMIN) capsule Take 1 capsule by mouth daily.      . Omega-3 Fatty Acids ([employer] NATURAL FISH OIL) 1000 MG CAPS Take 2 capsules by mouth daily.      Marland Kitchen omeprazole (PRILOSEC) 40 MG capsule Take 1 capsule (40 mg total) by mouth daily.  90 capsule  prn  . [DISCONTINUED] Vitamin D, Ergocalciferol, (DRISDOL) 50000 UNITS CAPS Take 50,000 Units by mouth.       No facility-administered encounter medications on file as of 05/06/2013.       Review of Systems  Constitutional: Negative.   HENT: Negative.   Eyes: Negative.   Respiratory:  Negative.   Cardiovascular: Negative.   Gastrointestinal: Negative.   Endocrine: Negative.   Genitourinary: Negative.   Musculoskeletal: Positive for arthralgias (joint pain/stiffness).  Skin: Negative.   Allergic/Immunologic: Negative.   Neurological: Negative.   Hematological: Negative.   Psychiatric/Behavioral: Negative.        Objective:   Physical Exam  Nursing note and vitals reviewed. Constitutional: She is oriented to person, place, and time. She appears well-developed and well-nourished. No distress.  Overweight, Though she has had a recent weight loss  HENT:  Head: Normocephalic and atraumatic.  Right Ear: External ear normal.  Left Ear: External ear normal.  Nose: Nose normal.  Mouth/Throat: Oropharynx is clear and moist.  Eyes: Conjunctivae and EOM are normal. Pupils are equal, round, and reactive to light. Right eye exhibits no discharge. Left eye exhibits no discharge. No scleral icterus.  Neck: Normal range of motion. Neck supple. No thyromegaly present.  Cardiovascular: Normal rate, regular rhythm and normal heart sounds.  Exam reveals no friction rub.   No murmur heard. At 60 per minute  Pulmonary/Chest: Effort normal and breath sounds normal. No respiratory distress. She has no wheezes. She has no rales.  Abdominal: Soft. Bowel sounds are normal. She exhibits  no mass. There is no tenderness. There is no rebound and no guarding.  Obesity  Genitourinary:  She sees her gynecologist in November  Musculoskeletal: Normal range of motion. She exhibits no edema.  Lymphadenopathy:    She has no cervical adenopathy.  Neurological: She is alert and oriented to person, place, and time. She has normal reflexes.  Skin: Skin is warm and dry. No rash noted. No erythema.  Psychiatric: She has a normal mood and affect. Her behavior is normal. Judgment and thought content normal.   BP 131/79  Pulse 68  Temp(Src) 97.6 F (36.4 C) (Oral)  Ht 5\' 1"  (1.549 m)  Wt 229 lb  (103.874 kg)  BMI 43.29 kg/m2        Assessment & Plan:    1. GERD   2. Hyperlipidemia   3. Asthma   4. Essential hypertension, benign   5. Unspecified vitamin D deficiency    Orders Placed This Encounter  Procedures  . Hepatic function panel  . BMP8+EGFR  . NMR, lipoprofile  . Vit D  25 hydroxy (rtn osteoporosis monitoring)  . POCT CBC   No orders of the defined types were placed in this encounter.   Patient Instructions  We will arrange an appointment with a clinical pharmacist to discuss some weight loss medication options and to see if you are a candidate for these Continue aggressive therapeutic lifestyle changes Always be careful and did not put herself at risk for falling Get your flu shot today and Prevnar Drink plenty of fluids Continue to take medication as directed Take the probiotic samples given today one daily to see if this helps with gas pain    make sure that you get your colonoscopy, pelvic exam and mammogram Also we will schedule you for a DEXA scan in December  Nyra Capes MD

## 2013-05-07 ENCOUNTER — Encounter: Payer: Self-pay | Admitting: Pharmacist

## 2013-05-07 ENCOUNTER — Telehealth: Payer: Self-pay | Admitting: Pharmacist

## 2013-05-07 ENCOUNTER — Ambulatory Visit (INDEPENDENT_AMBULATORY_CARE_PROVIDER_SITE_OTHER): Payer: BC Managed Care – PPO | Admitting: Pharmacist

## 2013-05-07 DIAGNOSIS — R635 Abnormal weight gain: Secondary | ICD-10-CM

## 2013-05-07 MED ORDER — LORCASERIN HCL 10 MG PO TABS: 10.0000 mg | ORAL_TABLET | Freq: Two times a day (BID) | ORAL | Status: DC

## 2013-05-07 MED ORDER — PHENTERMINE HCL 15 MG PO CAPS: 15.0000 mg | ORAL_CAPSULE | ORAL | Status: DC

## 2013-05-07 NOTE — Telephone Encounter (Signed)
Patient was given 2 rx's - 1 to start now (Belviq) for 2 weeks then will try phentermine for 4 weeks and see which medication works best.  Patient called.

## 2013-05-07 NOTE — Progress Notes (Signed)
Subjective:     Stacie Russell is a 62 y.o. female here for discussion regarding weight loss. She has noted a weight gain of approximately 70 pounds over the last several years. She feels ideal weight is 175 pounds. Weight at graduation from high school was 115 pounds. History of eating disorders: none. There is a family history positive for obesity in the patient and sister. Previous treatments for obesity include self-directed dieting. Obesity associated medical conditions: hyperlipidemia and osteoarthritis. Obesity associated medications: none. Cardiovascular risk factors besides obesity: dyslipidemia, hypertension, obesity (BMI >= 30 kg/m2) and sedentary lifestyle.  The following portions of the patient's history were reviewed and updated as appropriate: allergies, current medications, past family history, past medical history, past social history, past surgical history and problem list.    Objective:    Body mass index is 43.48 kg/(m^2). Filed Vitals:   05/07/13 1650  BP: 132/80  Pulse: 74    Assessment:    Obesity. I assessed Stacie Russell to be in an action stage with respect to weight loss.    Plan:    General weight loss/lifestyle modification strategies discussed (elicit support from others; identify saboteurs; non-food rewards, etc). Behavioral treatment: stress management. Diet interventions: moderate (500 kCal/d) deficit diet. Medication: will try Belviq 10mg  bid for 2 weeks, then phentermine 15mg  qam.  Will reassess in 6 weeks and determine which medication worked best.  Patient insturcted on side effects to monitor for and call office if experiences any problems.. Exercise as able since having knee pain. Patient will exercise in pool.      Henrene Pastor, PharmD, CPP

## 2013-05-08 LAB — HEPATIC FUNCTION PANEL
ALT: 23 IU/L (ref 0–32)
AST: 15 IU/L (ref 0–40)
Alkaline Phosphatase: 77 IU/L (ref 39–117)

## 2013-05-08 LAB — NMR, LIPOPROFILE
HDL Cholesterol by NMR: 55 mg/dL (ref >=40)
LDLC SERPL CALC-MCNC: 95 mg/dL (ref <100)
LP-IR Score: <25 (ref <=45)
Small LDL Particle Number: 630 nmol/L — ABNORMAL HIGH (ref <=527)

## 2013-05-08 LAB — BMP8+EGFR
BUN: 17 mg/dL (ref 8–27)
CO2: 27 mmol/L (ref 18–29)
Calcium: 9.4 mg/dL (ref 8.6–10.2)
Chloride: 101 mmol/L (ref 97–108)
Creatinine, Ser: 0.67 mg/dL (ref 0.57–1.00)
Sodium: 143 mmol/L (ref 134–144)

## 2013-05-08 LAB — VITAMIN D 25 HYDROXY (VIT D DEFICIENCY, FRACTURES): Vit D, 25-Hydroxy: 53.3 ng/mL (ref 30.0–100.0)

## 2013-05-14 NOTE — Progress Notes (Signed)
Patient notified of results.

## 2013-06-22 ENCOUNTER — Ambulatory Visit (INDEPENDENT_AMBULATORY_CARE_PROVIDER_SITE_OTHER): Payer: BC Managed Care – PPO | Admitting: Pharmacist

## 2013-06-22 VITALS — BP 138/76 | HR 72 | Ht 61.0 in | Wt 228.0 lb

## 2013-06-22 DIAGNOSIS — R635 Abnormal weight gain: Secondary | ICD-10-CM

## 2013-06-22 DIAGNOSIS — E785 Hyperlipidemia, unspecified: Secondary | ICD-10-CM

## 2013-06-22 DIAGNOSIS — I10 Essential (primary) hypertension: Secondary | ICD-10-CM

## 2013-06-22 NOTE — Progress Notes (Signed)
Subjective:     Stacie Russell is a 63 y.o. female here for follow up regarding weight loss.She was last seen about 6 weeks ago.  At that time rx was given for Belviq with 14 days free coupon.  Patient tried but she didn't see much change in her appetite or weight.  Also was given an rx for phentermine to try but she decided to try just diet.  Since 11/03/2012 she has lost 8# with diet changes and increased exercise.      She feels her ideal weight is 175 pounds. Weight at graduation from high school was 115 pounds. History of eating disorders: none. There is a family history positive for obesity in the patient and sister. Previous treatments for obesity include self-directed dieting. Obesity associated medical conditions: hyperlipidemia and osteoarthritis. Obesity associated medications: none. Cardiovascular risk factors besides obesity: dyslipidemia, hypertension, obesity (BMI >= 30 kg/m2) and sedentary lifestyle.  Diet - following [place] type diet - increase lean proteins and vegetables.  She does have occassional bread and other starches.   Snacks rarely and usually only drinks water or coffee.  The following portions of the patient's history were reviewed and updated as appropriate: allergies, current medications, past family history, past medical history, past social history, past surgical history and problem list.    Objective:    Body mass index is 43.1 kg/(m^2). Filed Vitals:   06/22/13 2200  BP: 138/76  Pulse: 72    Assessment:    Obesity. I assessed Stacie Russell to be in an action stage with respect to weight loss.  She has lost a total of 8# since 10/2012   Plan:    General weight loss/lifestyle modification strategies discussed (elicit support from others; identify saboteurs; non-food rewards, etc). Diet interventions: moderate (500 kCal/d) deficit diet. Exercise as able since having knee pain. Patient will exercise in pool.   RTC in 6 weeks    Henrene Pastor, PharmD,  CPP

## 2013-07-17 ENCOUNTER — Encounter: Payer: Self-pay | Admitting: *Deleted

## 2013-08-10 ENCOUNTER — Ambulatory Visit: Payer: Self-pay

## 2013-08-12 ENCOUNTER — Ambulatory Visit (INDEPENDENT_AMBULATORY_CARE_PROVIDER_SITE_OTHER): Payer: BC Managed Care – PPO | Admitting: Pharmacist

## 2013-08-12 DIAGNOSIS — R635 Abnormal weight gain: Secondary | ICD-10-CM

## 2013-08-12 DIAGNOSIS — E8881 Metabolic syndrome: Secondary | ICD-10-CM

## 2013-08-12 DIAGNOSIS — E785 Hyperlipidemia, unspecified: Secondary | ICD-10-CM

## 2013-08-12 NOTE — Progress Notes (Signed)
Subjective:     Stacie Russell is a 62 y.o. female here for follow up regarding weight loss. She was last seen 06/2013.  Patient tried Belviq about 6 months ago, but she didn't see much change in her appetite or weight.  Also was given an rx for phentermine to try but she decided to try just diet.  Since 11/03/2012 she has lost 8# with diet changes and increased exercise, thought she reports that over the holidays she was exercised less and has not been following a diet.    She feels her ideal weight is 175 pounds. Weight at graduation from high school was 115 pounds. History of eating disorders: none. There is a family history positive for obesity in the patient and sister. Previous treatments for obesity include self-directed dieting. Obesity associated medical conditions: hyperlipidemia and osteoarthritis. Obesity associated medications: none. Cardiovascular risk factors besides obesity: dyslipidemia, hypertension, obesity (BMI >= 30 kg/m2) and sedentary lifestyle.  Diet - following [place] type diet - increase lean proteins and vegetables.  She does have occassional bread and other starches.   Snacks rarely and usually only drinks water or coffee.  Exercise - decreased since last visit  The following portions of the patient's history were reviewed and updated as appropriate: allergies, current medications, past family history, past medical history, past social history, past surgical history and problem list.    Objective:    Body mass index is 43.1 kg/(m^2). Filed Vitals:   08/12/13 1024  BP: 148/80  Pulse: 78   Assessment:    Obesity. I assessed Adell to be in an action stage with respect to weight loss.  She has maintained weight over the last 6 weeks with total of 8# loss since 10/2012. Metabolic Syndrome / Hyperlipidemia   Plan:    General weight loss/lifestyle modification strategies discussed (elicit support from others; identify saboteurs; non-food rewards, etc). Diet  interventions: moderate (500 kCal/d) deficit diet. Exercise as able since having knee pain. Patient will exercise in pool.   Discussed using map my walk application for motivation and to track distance when walking. Gave article about portion sizes and healthy snack ideas.  RTC in 6 weeks.    Henrene Pastor, PharmD, CPP

## 2013-09-09 ENCOUNTER — Encounter: Payer: Self-pay | Admitting: Internal Medicine

## 2013-09-15 ENCOUNTER — Encounter: Payer: Self-pay | Admitting: Internal Medicine

## 2013-10-07 ENCOUNTER — Encounter: Payer: Self-pay | Admitting: Gastroenterology

## 2013-10-22 ENCOUNTER — Encounter: Payer: Self-pay | Admitting: Internal Medicine

## 2013-10-22 ENCOUNTER — Ambulatory Visit (INDEPENDENT_AMBULATORY_CARE_PROVIDER_SITE_OTHER): Payer: BC Managed Care – PPO | Admitting: Internal Medicine

## 2013-10-22 VITALS — BP 124/82 | HR 68 | Ht 61.0 in | Wt 231.0 lb

## 2013-10-22 DIAGNOSIS — R079 Chest pain, unspecified: Secondary | ICD-10-CM | POA: Insufficient documentation

## 2013-10-22 DIAGNOSIS — Z1211 Encounter for screening for malignant neoplasm of colon: Secondary | ICD-10-CM

## 2013-10-22 DIAGNOSIS — K219 Gastro-esophageal reflux disease without esophagitis: Secondary | ICD-10-CM

## 2013-10-22 DIAGNOSIS — Z800 Family history of malignant neoplasm of digestive organs: Secondary | ICD-10-CM

## 2013-10-22 DIAGNOSIS — R1314 Dysphagia, pharyngoesophageal phase: Secondary | ICD-10-CM

## 2013-10-22 MED ORDER — NA SULFATE-K SULFATE-MG SULF 17.5-3.13-1.6 GM/177ML PO SOLN: ORAL | Status: DC

## 2013-10-22 MED ORDER — PANTOPRAZOLE SODIUM 40 MG PO TBEC: 40.0000 mg | DELAYED_RELEASE_TABLET | Freq: Every day | ORAL | Status: DC

## 2013-10-22 NOTE — Assessment & Plan Note (Signed)
She has a history of erosive esophagitis in the past. That seemed to respond to Nexium. She is now symptomatic on omeprazole. Try pantoprazole 40 mg daily. Reassess an EGD.The risks and benefits as well as alternatives of endoscopic procedure(s) have been discussed and reviewed. All questions answered. The patient agrees to proceed.

## 2013-10-22 NOTE — Assessment & Plan Note (Addendum)
My current best guess here would be this would be from reflux or perhaps esophageal dysmotility are both at this point. The chest wall seems nontender. Await EGD. Could need barium swallow or manometry study pending clinical course.

## 2013-10-22 NOTE — Assessment & Plan Note (Addendum)
Evaluate with upper endoscopy, possible dilation.The risks and benefits as well as alternatives of endoscopic procedure(s) have been discussed and reviewed. All questions answered. The patient agrees to proceed. Should these symptoms completely resolve on a new PPI could forgo EGD, she knows to call prior to that.

## 2013-10-22 NOTE — Patient Instructions (Addendum)
You have been scheduled for an endoscopy and colonoscopy with propofol. Please follow the written instructions given to you at your visit today. Please pick up your prep at the pharmacy within the next 1-3 days. If you use inhalers (even only as needed), please bring them with you on the day of your procedure.  We have sent the following medications to your pharmacy for you to pick up at your convenience: Generic Protonix.   Stop your omeprazole   I appreciate the opportunity to care for you.

## 2013-10-22 NOTE — Assessment & Plan Note (Signed)
Mother had colon cancer in her 31s. Last colonoscopy 2009. Schedule high risk screening colonoscopy.The risks and benefits as well as alternatives of endoscopic procedure(s) have been discussed and reviewed. All questions answered. The patient agrees to proceed.

## 2013-10-22 NOTE — Progress Notes (Signed)
Subjective:    Patient ID: Stacie Russell, female    DOB: 1951/06/29, 63 y.o.   MRN: [id number]  HPI The patient is a pleasant middle-aged white woman I know from previous procedures and GERD issues. A number of years ago she had an EGD with dilation at some erosive esophagitis and did well for a few years on Nexium. She was then switched to omeprazole. Somewhere after that she began having intermittent chest pain problems the last for 5-10 minutes, and she is now having some intermittent dysphagia to things like pain upon her and if she eats quickly with solid food she will have a midsternal or suprasternal sticking point. Number of years ago she says she had a cardiac workup which was negative for these chest pains which persisted a few times a week. She cannot correlate particular food triggers or anything like that and they're not always when she has swallowing difficulty.  She has periodic colonoscopy about every 5 years because her mother had colon cancer in her 43s. No lower GI symptoms today.  She drinks one cup of coffee a day. No other caffeine.  Wt Readings from Last 3 Encounters:  10/22/13 231 lb (104.781 kg)  08/12/13 228 lb (103.42 kg)  06/22/13 228 lb (103.42 kg)   Medications, allergies, past medical history, past surgical history, family history and social history are reviewed and updated in the EMR.   Review of Systems As per history of present illness. In addition she's been having knee pain. She is working on losing weight.    Objective:   Physical Exam General:  Well-developed, well-nourished and in no acute distress, morbidly obese Eyes:  anicteric. ENT:   Mouth and posterior pharynx free of lesions.  Neck:   supple w/o thyromegaly or mass.  Lungs: Clear to auscultation bilaterally. Heart:  S1S2, no rubs, murmurs, gallops. Abdomen:  soft, non-tender, no hepatosplenomegaly, hernia, or mass and BS+.  Rectal: Deferred until colonoscopy Lymph:  no  cervical or supraclavicular adenopathy. Extremities:   no edema Skin   no rash. Neuro:  A&O x 3.  Psych:  appropriate mood and  Affect.   Data Reviewed: Previous EGD, colonoscopy primary care notes, labs in the EMR Reviewed imaging files for any barium swallow or similar imaging, there was none    Assessment & Plan:  GERD She has a history of erosive esophagitis in the past. That seemed to respond to Nexium. She is now symptomatic on omeprazole. Try pantoprazole 40 mg daily. Reassess an EGD.The risks and benefits as well as alternatives of endoscopic procedure(s) have been discussed and reviewed. All questions answered. The patient agrees to proceed.   Dysphagia, pharyngoesophageal phase Evaluate with upper endoscopy, possible dilation.The risks and benefits as well as alternatives of endoscopic procedure(s) have been discussed and reviewed. All questions answered. The patient agrees to proceed. Should these symptoms completely resolve on a new PPI could forgo EGD, she knows to call prior to that.  Chest pain My current best guess here would be this would be from reflux or perhaps esophageal dysmotility are both at this point. The chest wall seems nontender. Await EGD. Could need barium swallow or manometry study pending clinical course.  Family history of malignant neoplasm of gastrointestinal tract Mother had colon cancer in her 50s. Last colonoscopy 2009. Schedule high risk screening colonoscopy.The risks and benefits as well as alternatives of endoscopic procedure(s) have been discussed and reviewed. All questions answered. The patient agrees  to proceed.    I appreciate the opportunity to care for this patient. CC: Redge Gainer, MD

## 2013-11-04 ENCOUNTER — Ambulatory Visit: Payer: BC Managed Care – PPO | Admitting: Family Medicine

## 2013-11-19 ENCOUNTER — Other Ambulatory Visit: Payer: Self-pay | Admitting: *Deleted

## 2013-11-19 MED ORDER — MELOXICAM 15 MG PO TABS: 15.0000 mg | ORAL_TABLET | Freq: Every day | ORAL | Status: DC

## 2013-11-20 ENCOUNTER — Telehealth: Payer: Self-pay | Admitting: *Deleted

## 2013-11-20 NOTE — Telephone Encounter (Signed)
Rx for Mobic 15mg  recently refilled to pharmacy. Directions state take 1 tablet by mouth daily and also says take as directed 1/2 tab by mouth. Need clarification on directions. Please advise

## 2013-11-20 NOTE — Telephone Encounter (Signed)
1/2-1 daily as needed after eating-----take one half when there is less discomfort and a whole one when there is more discomfort

## 2013-11-23 ENCOUNTER — Other Ambulatory Visit: Payer: Self-pay | Admitting: *Deleted

## 2013-11-23 MED ORDER — MELOXICAM 15 MG PO TABS: 15.0000 mg | ORAL_TABLET | Freq: Every day | ORAL | Status: DC

## 2013-12-10 ENCOUNTER — Ambulatory Visit: Payer: BC Managed Care – PPO | Admitting: Family Medicine

## 2013-12-22 ENCOUNTER — Telehealth: Payer: Self-pay | Admitting: Internal Medicine

## 2013-12-22 NOTE — Telephone Encounter (Signed)
No

## 2013-12-22 NOTE — Telephone Encounter (Signed)
Pt resch'd her procedure from 12-24-13 to 01-05-14 because there has been a mix up with her insurance and it has been terminated.  Pt stated she has been trying to get this corrected since 11-25-13 and they keep telling her they are working on it.   Would you like the Pt charged the cancellation fee?

## 2013-12-23 ENCOUNTER — Ambulatory Visit: Payer: BC Managed Care – PPO | Admitting: Family Medicine

## 2013-12-24 ENCOUNTER — Encounter: Payer: BC Managed Care – PPO | Admitting: Internal Medicine

## 2014-01-05 ENCOUNTER — Ambulatory Visit: Payer: BC Managed Care – PPO | Admitting: Family Medicine

## 2014-01-05 ENCOUNTER — Encounter: Payer: BC Managed Care – PPO | Admitting: Internal Medicine

## 2014-01-07 ENCOUNTER — Other Ambulatory Visit: Payer: Self-pay | Admitting: Family Medicine

## 2014-01-08 NOTE — Telephone Encounter (Signed)
Last seen 05/06/13  [facility]  requesting a 90 day supply

## 2014-01-08 NOTE — Telephone Encounter (Signed)
The patient is to make an appointment to be seen. We will refill this times one.

## 2014-04-09 ENCOUNTER — Other Ambulatory Visit: Payer: Self-pay | Admitting: Family Medicine

## 2014-04-12 NOTE — Telephone Encounter (Signed)
This is okay to refill x1. Please make sure the patient has an appointment to see a provider

## 2014-04-12 NOTE — Telephone Encounter (Signed)
Last ov with Tammy 12/14. Last ov with provider 9/14. Pt needs appt with provider.

## 2014-06-25 ENCOUNTER — Ambulatory Visit (INDEPENDENT_AMBULATORY_CARE_PROVIDER_SITE_OTHER): Payer: BC Managed Care – PPO | Admitting: Family Medicine

## 2014-06-25 ENCOUNTER — Encounter: Payer: Self-pay | Admitting: Family Medicine

## 2014-06-25 ENCOUNTER — Ambulatory Visit (INDEPENDENT_AMBULATORY_CARE_PROVIDER_SITE_OTHER): Payer: BC Managed Care – PPO

## 2014-06-25 ENCOUNTER — Telehealth: Payer: Self-pay

## 2014-06-25 VITALS — BP 137/77 | HR 81 | Temp 97.2°F | Ht 61.0 in | Wt 219.0 lb

## 2014-06-25 DIAGNOSIS — E559 Vitamin D deficiency, unspecified: Secondary | ICD-10-CM

## 2014-06-25 DIAGNOSIS — Z13820 Encounter for screening for osteoporosis: Secondary | ICD-10-CM

## 2014-06-25 DIAGNOSIS — E8881 Metabolic syndrome: Secondary | ICD-10-CM

## 2014-06-25 DIAGNOSIS — Z23 Encounter for immunization: Secondary | ICD-10-CM

## 2014-06-25 DIAGNOSIS — I10 Essential (primary) hypertension: Secondary | ICD-10-CM

## 2014-06-25 DIAGNOSIS — K219 Gastro-esophageal reflux disease without esophagitis: Secondary | ICD-10-CM

## 2014-06-25 DIAGNOSIS — E785 Hyperlipidemia, unspecified: Secondary | ICD-10-CM

## 2014-06-25 LAB — POCT CBC
Granulocyte percent: 64.8 %G (ref 37–80)
HEMATOCRIT: 41.3 % (ref 37.7–47.9)
HEMOGLOBIN: 13.5 g/dL (ref 12.2–16.2)
Lymph, poc: 2 (ref 0.6–3.4)
MCH: 29.5 pg (ref 27–31.2)
MCHC: 32.8 g/dL (ref 31.8–35.4)
MCV: 89.9 fL (ref 80–97)
MPV: 8.3 fL (ref 0–99.8)
POC Granulocyte: 4 (ref 2–6.9)
POC LYMPH %: 31.5 % (ref 10–50)
Platelet Count, POC: 231 10*3/uL (ref 142–424)
RBC: 4.6 M/uL (ref 4.04–5.48)
RDW, POC: 13.3 %
WBC: 6.2 10*3/uL (ref 4.6–10.2)

## 2014-06-25 LAB — POCT GLYCOSYLATED HEMOGLOBIN (HGB A1C): HEMOGLOBIN A1C: 5.3

## 2014-06-25 MED ORDER — LOSARTAN POTASSIUM-HCTZ 50-12.5 MG PO TABS: ORAL_TABLET | ORAL | Status: DC

## 2014-06-25 NOTE — Telephone Encounter (Signed)
LM on am with CXR results as per [facility]

## 2014-06-25 NOTE — Patient Instructions (Addendum)
Medicare Annual Wellness Visit  [facility] and the medical providers at [facility] strive to bring you the best medical care.  In doing so we not only want to address your current medical conditions and concerns but also to detect new conditions early and prevent illness, disease and health-related problems.    Medicare offers a yearly Wellness Visit which allows our clinical staff to assess your need for preventative services including immunizations, lifestyle education, counseling to decrease risk of preventable diseases and screening for fall risk and other medical concerns.    This visit is provided free of charge (no copay) for all Medicare recipients. The clinical pharmacists at [facility] have begun to conduct these Wellness Visits which will also include a thorough review of all your medications.    As you primary medical provider recommend that you make an appointment for your Annual Wellness Visit if you have not done so already this year.  You may set up this appointment before you leave today or you may call back ([phone]) and schedule an appointment.  Please make sure when you call that you mention that you are scheduling your Annual Wellness Visit with the clinical pharmacist so that the appointment may be made for the proper length of time.      Continue current medications. Continue good therapeutic lifestyle changes which include good diet and exercise. Fall precautions discussed with patient. If an FOBT was given today- please return it to our front desk. If you are over 48 years old - you may need Prevnar 65 or the adult Pneumonia vaccine.  Flu Shots will be available at our office starting mid- September. Please call and schedule a FLU CLINIC APPOINTMENT.   Continue to follow up with gastroenterologist for your colonoscopy and endoscopy We will call you with the results of the lab work and the chest x-ray  results once those are available Continue your exercise regimen and diet regimen

## 2014-06-25 NOTE — Progress Notes (Addendum)
Subjective:    Patient ID: Stacie Russell, female    DOB: November 19, 1950, 63 y.o.   MRN: [id number]  HPI Pt here for follow up and management of chronic medical problems. This patient has no specific complaints today. She is due to get a chest x-ray, DEXA scan, lab work, and a flu shot. She is scheduled for a colonoscopy. She also requests that her losartan HCT be refilled . There is a family history of colon cancer and the patient's brother just died of esophageal cancer. She should probably make sure that she gets an endoscopy done if she is still having some symptoms with her swallowing although much improved since she is started a new medication from the gastroenterologist.         Patient Active Problem List   Diagnosis Date Noted  . Chest pain 10/22/2013  . Dysphagia, pharyngoesophageal phase 10/22/2013  . Family history of malignant neoplasm of gastrointestinal tract 10/22/2013  . Metabolic syndrome 47/82/9562  . Severe obesity (BMI >= 40) 05/07/2013  . Essential hypertension, benign 11/03/2012  . Unspecified vitamin D deficiency 11/03/2012  . Knee pain   . Endometrial hyperplasia   . Hemorrhoid   . Foot pain   . Esophageal stricture   . Diverticulosis   . Hip pain   . Gastritis   . Tendonitis of ankle   . Hiatal hernia   . Hyperlipidemia   . Asthma   . GERD 10/03/2009   Outpatient Encounter Prescriptions as of 06/25/2014  Medication Sig  . albuterol (PROVENTIL HFA;VENTOLIN HFA) 108 (90 BASE) MCG/ACT inhaler Inhale 2 puffs into the lungs every 6 (six) hours as needed for wheezing.  Marland Kitchen aspirin 81 MG tablet Take 81 mg by mouth daily.  . budesonide-formoterol (SYMBICORT) 80-4.5 MCG/ACT inhaler Inhale 2 puffs into the lungs 2 (two) times daily.  . Cholecalciferol (VITAMIN D3) 3000 UNITS TABS Take 1 capsule by mouth daily.  . fesoterodine (TOVIAZ) 8 MG TB24 tablet Take 8 mg by mouth daily.  Marland Kitchen losartan-hydrochlorothiazide (HYZAAR) 50-12.5 MG per tablet TAKE ONE TABLET BY  MOUTH ONCE DAILY  . Omega-3 Fatty Acids ([employer] NATURAL FISH OIL) 1000 MG CAPS Take 2 capsules by mouth daily.  . [DISCONTINUED] Multiple Vitamin (MULTIVITAMIN) capsule Take 1 capsule by mouth daily.  . [DISCONTINUED] solifenacin (VESICARE) 10 MG tablet Take 5 mg by mouth daily.  . Na Sulfate-K Sulfate-Mg Sulf (SUPREP BOWEL PREP) SOLN Use as directed  . [DISCONTINUED] meloxicam (MOBIC) 15 MG tablet Take 1 tablet (15 mg total) by mouth daily.  . [DISCONTINUED] pantoprazole (PROTONIX) 40 MG tablet Take 1 tablet (40 mg total) by mouth daily before breakfast.    Review of Systems  Constitutional: Negative.   HENT: Negative.   Eyes: Negative.   Respiratory: Negative.   Cardiovascular: Negative.   Gastrointestinal: Negative.   Endocrine: Negative.   Genitourinary: Negative.   Musculoskeletal: Negative.   Skin: Negative.   Allergic/Immunologic: Negative.   Neurological: Negative.   Hematological: Negative.   Psychiatric/Behavioral: Negative.        Objective:   Physical Exam  Constitutional: She is oriented to person, place, and time. She appears well-developed and well-nourished. No distress.  HENT:  Head: Normocephalic and atraumatic.  Right Ear: External ear normal.  Left Ear: External ear normal.  Mouth/Throat: Oropharynx is clear and moist.  Nasal congestion left greater than right  Eyes: Conjunctivae and EOM are normal. Pupils are equal, round, and reactive to light. Right eye exhibits no discharge. Left eye exhibits no  discharge. No scleral icterus.  Neck: Normal range of motion. Neck supple. No thyromegaly present.  No anterior cervical nodes or thyromegaly or bruits  Cardiovascular: Normal rate, regular rhythm, normal heart sounds and intact distal pulses.   No murmur heard. At 84/m  Pulmonary/Chest: Effort normal and breath sounds normal. No respiratory distress. She has no wheezes. She has no rales. She exhibits no tenderness.  Abdominal: Soft. Bowel sounds are normal. She  exhibits no mass. There is tenderness. There is no rebound and no guarding.  Obesity, there Is some epigastric tenderness with out organ enlargement or masses  Musculoskeletal: Normal range of motion. She exhibits no edema.  Lymphadenopathy:    She has no cervical adenopathy.  Neurological: She is alert and oriented to person, place, and time. She has normal reflexes. No cranial nerve deficit.  Skin: Skin is warm and dry. No rash noted.  Psychiatric: She has a normal mood and affect. Her behavior is normal. Judgment and thought content normal.  Nursing note and vitals reviewed.  BP 137/77 mmHg  Pulse 81  Temp(Src) 97.2 F (36.2 C) (Oral)  Ht _0  (1.549 m)  Wt 219 lb (99.338 kg)  BMI 41.40 kg/m2  [facility] reading (PRIMARY) by  Dr. Brunilda Payor x-ray--no active disease                                       Assessment & Plan:  1. Hyperlipidemia - POCT CBC - NMR, lipoprofile  2. Metabolic syndrome - POCT CBC - POCT glycosylated hemoglobin (Hb A1C)  3. Essential hypertension, benign - DG Chest 2 View; Future - POCT CBC - BMP8+EGFR - Hepatic function panel  4. Gastroesophageal reflux disease, esophagitis presence not specified - POCT CBC  5. Severe obesity (BMI >= 40) - POCT CBC  6. Vitamin D deficiency - DG Bone Density; Future - POCT CBC - Vit D  25 hydroxy (rtn osteoporosis monitoring)  7. Screening for osteoporosis - DG Bone Density; Future  Meds ordered this encounter  Medications  . fesoterodine (TOVIAZ) 8 MG TB24 tablet    Sig: Take 8 mg by mouth daily.  Marland Kitchen losartan-hydrochlorothiazide (HYZAAR) 50-12.5 MG per tablet    Sig: TAKE ONE TABLET BY MOUTH ONCE DAILY    Dispense:  90 tablet    Refill:  3    Needs to be seen for further refills.   Patient Instructions                       Medicare Annual Wellness Visit  [facility] and the medical providers at [facility] strive to bring you the best medical care.  In doing so we not only  want to address your current medical conditions and concerns but also to detect new conditions early and prevent illness, disease and health-related problems.    Medicare offers a yearly Wellness Visit which allows our clinical staff to assess your need for preventative services including immunizations, lifestyle education, counseling to decrease risk of preventable diseases and screening for fall risk and other medical concerns.    This visit is provided free of charge (no copay) for all Medicare recipients. The clinical pharmacists at [facility] have begun to conduct these Wellness Visits which will also include a thorough review of all your medications.    As you primary medical provider recommend that you make an appointment for  your Annual Wellness Visit if you have not done so already this year.  You may set up this appointment before you leave today or you may call back ([phone]) and schedule an appointment.  Please make sure when you call that you mention that you are scheduling your Annual Wellness Visit with the clinical pharmacist so that the appointment may be made for the proper length of time.      Continue current medications. Continue good therapeutic lifestyle changes which include good diet and exercise. Fall precautions discussed with patient. If an FOBT was given today- please return it to our front desk. If you are over 48 years old - you may need Prevnar 44 or the adult Pneumonia vaccine.  Flu Shots will be available at our office starting mid- September. Please call and schedule a FLU CLINIC APPOINTMENT.   Continue to follow up with gastroenterologist for your colonoscopy and endoscopy We will call you with the results of the lab work and the chest x-ray results once those are available Continue your exercise regimen and diet regimen   Arrie Senate MD

## 2014-06-25 NOTE — Telephone Encounter (Signed)
-----   Message from Chipper Herb, MD sent at 06/25/2014  1:25 PM EST ----- As per radiology report

## 2014-06-26 LAB — BMP8+EGFR
BUN / CREAT RATIO: 20 (ref 11–26)
BUN: 14 mg/dL (ref 8–27)
CO2: 24 mmol/L (ref 18–29)
Calcium: 9.4 mg/dL (ref 8.7–10.3)
Chloride: 103 mmol/L (ref 97–108)
Creatinine, Ser: 0.7 mg/dL (ref 0.57–1.00)
GFR calc non Af Amer: 92 mL/min/{1.73_m2} (ref >59)
GFR, EST AFRICAN AMERICAN: 106 mL/min/{1.73_m2} (ref >59)
Glucose: 82 mg/dL (ref 65–99)
POTASSIUM: 3.9 mmol/L (ref 3.5–5.2)
SODIUM: 141 mmol/L (ref 134–144)

## 2014-06-26 LAB — HEPATIC FUNCTION PANEL
ALK PHOS: 79 IU/L (ref 39–117)
ALT: 25 IU/L (ref 0–32)
AST: 16 IU/L (ref 0–40)
Albumin: 4.2 g/dL (ref 3.6–4.8)
BILIRUBIN DIRECT: 0.12 mg/dL (ref 0.00–0.40)
Total Bilirubin: 0.5 mg/dL (ref 0.0–1.2)
Total Protein: 6.6 g/dL (ref 6.0–8.5)

## 2014-06-26 LAB — NMR, LIPOPROFILE
Cholesterol: 187 mg/dL (ref 100–199)
HDL Cholesterol by NMR: 61 mg/dL (ref >39)
HDL Particle Number: 37.2 umol/L (ref >=30.5)
LDL PARTICLE NUMBER: 1294 nmol/L — AB (ref <1000)
LDL SIZE: 20.9 nm (ref >20.5)
LDL-C: 113 mg/dL — ABNORMAL HIGH (ref 0–99)
LP-IR SCORE: 26 (ref <=45)
Small LDL Particle Number: 473 nmol/L (ref <=527)
Triglycerides by NMR: 67 mg/dL (ref 0–149)

## 2014-06-26 LAB — VITAMIN D 25 HYDROXY (VIT D DEFICIENCY, FRACTURES): Vit D, 25-Hydroxy: 42.5 ng/mL (ref 30.0–100.0)

## 2014-06-28 ENCOUNTER — Telehealth: Payer: Self-pay | Admitting: Family Medicine

## 2014-06-28 NOTE — Telephone Encounter (Signed)
-----   Message from Chipper Herb, MD sent at 06/26/2014  9:01 AM EST ----- The blood sugar is good at 82. The creatinine, the most important kidney function test is within normal limits. The electrolytes including potassium are within normal limits. All liver function tests are within normal limits Cholesterol numbers with advanced lipid testing have an LDL particle number that is elevated at 1294. The goal for this numbers less than 1000. One year ago this number was 1404. The LDL C is elevated at 113. The triglycerides are good at 67.------please schedule the patient for a visit with the clinical pharmacists to discuss better measures for controlling the cholesterol with diet and exercise and weight loss. The vitamin D level is good at 42.5 though lower than it was one year ago. Please take your vitamin D regularly.

## 2014-06-28 NOTE — Telephone Encounter (Signed)
Patient aware of lab results. Will call back to schedule appt with clinical pharmacist.

## 2014-07-02 ENCOUNTER — Telehealth: Payer: Self-pay

## 2014-07-02 NOTE — Telephone Encounter (Signed)
-----   Message from Chipper Herb, MD sent at 06/26/2014  9:01 AM EST ----- The blood sugar is good at 82. The creatinine, the most important kidney function test is within normal limits. The electrolytes including potassium are within normal limits. All liver function tests are within normal limits Cholesterol numbers with advanced lipid testing have an LDL particle number that is elevated at 1294. The goal for this numbers less than 1000. One year ago this number was 1404. The LDL C is elevated at 113. The triglycerides are good at 67.------please schedule the patient for a visit with the clinical pharmacists to discuss better measures for controlling the cholesterol with diet and exercise and weight loss. The vitamin D level is good at 42.5 though lower than it was one year ago. Please take your vitamin D regularly.

## 2014-07-02 NOTE — Telephone Encounter (Signed)
Pt aware of lab results 

## 2014-08-17 DIAGNOSIS — N3946 Mixed incontinence: Secondary | ICD-10-CM | POA: Diagnosis not present

## 2014-08-17 DIAGNOSIS — M62838 Other muscle spasm: Secondary | ICD-10-CM | POA: Diagnosis not present

## 2014-08-17 DIAGNOSIS — R278 Other lack of coordination: Secondary | ICD-10-CM | POA: Diagnosis not present

## 2014-08-17 DIAGNOSIS — M6281 Muscle weakness (generalized): Secondary | ICD-10-CM | POA: Diagnosis not present

## 2014-09-08 ENCOUNTER — Ambulatory Visit: Payer: BC Managed Care – PPO

## 2014-09-08 ENCOUNTER — Other Ambulatory Visit: Payer: BC Managed Care – PPO

## 2014-09-14 DIAGNOSIS — M62838 Other muscle spasm: Secondary | ICD-10-CM | POA: Diagnosis not present

## 2014-09-14 DIAGNOSIS — N3946 Mixed incontinence: Secondary | ICD-10-CM | POA: Diagnosis not present

## 2014-09-14 DIAGNOSIS — M6281 Muscle weakness (generalized): Secondary | ICD-10-CM | POA: Diagnosis not present

## 2014-09-14 DIAGNOSIS — R278 Other lack of coordination: Secondary | ICD-10-CM | POA: Diagnosis not present

## 2014-09-16 ENCOUNTER — Encounter: Payer: Self-pay | Admitting: *Deleted

## 2014-10-05 DIAGNOSIS — H04123 Dry eye syndrome of bilateral lacrimal glands: Secondary | ICD-10-CM | POA: Diagnosis not present

## 2014-10-05 DIAGNOSIS — H43813 Vitreous degeneration, bilateral: Secondary | ICD-10-CM | POA: Diagnosis not present

## 2014-10-26 DIAGNOSIS — R278 Other lack of coordination: Secondary | ICD-10-CM | POA: Diagnosis not present

## 2014-10-26 DIAGNOSIS — N3946 Mixed incontinence: Secondary | ICD-10-CM | POA: Diagnosis not present

## 2014-10-26 DIAGNOSIS — M62838 Other muscle spasm: Secondary | ICD-10-CM | POA: Diagnosis not present

## 2014-10-26 DIAGNOSIS — M6281 Muscle weakness (generalized): Secondary | ICD-10-CM | POA: Diagnosis not present

## 2014-10-31 ENCOUNTER — Other Ambulatory Visit: Payer: Self-pay | Admitting: Internal Medicine

## 2014-12-07 ENCOUNTER — Other Ambulatory Visit: Payer: Self-pay | Admitting: Internal Medicine

## 2014-12-29 ENCOUNTER — Ambulatory Visit (INDEPENDENT_AMBULATORY_CARE_PROVIDER_SITE_OTHER): Payer: Medicare Other

## 2014-12-29 ENCOUNTER — Encounter (INDEPENDENT_AMBULATORY_CARE_PROVIDER_SITE_OTHER): Payer: Self-pay

## 2014-12-29 ENCOUNTER — Encounter: Payer: Self-pay | Admitting: Family Medicine

## 2014-12-29 ENCOUNTER — Ambulatory Visit (INDEPENDENT_AMBULATORY_CARE_PROVIDER_SITE_OTHER): Payer: Medicare Other | Admitting: Family Medicine

## 2014-12-29 ENCOUNTER — Other Ambulatory Visit: Payer: Self-pay | Admitting: Family Medicine

## 2014-12-29 VITALS — BP 148/80 | HR 66 | Temp 97.7°F | Ht 61.0 in | Wt 228.0 lb

## 2014-12-29 DIAGNOSIS — M5441 Lumbago with sciatica, right side: Secondary | ICD-10-CM

## 2014-12-29 DIAGNOSIS — I10 Essential (primary) hypertension: Secondary | ICD-10-CM | POA: Diagnosis not present

## 2014-12-29 DIAGNOSIS — R3915 Urgency of urination: Secondary | ICD-10-CM

## 2014-12-29 DIAGNOSIS — E559 Vitamin D deficiency, unspecified: Secondary | ICD-10-CM

## 2014-12-29 DIAGNOSIS — E8881 Metabolic syndrome: Secondary | ICD-10-CM | POA: Diagnosis not present

## 2014-12-29 DIAGNOSIS — N3946 Mixed incontinence: Secondary | ICD-10-CM | POA: Insufficient documentation

## 2014-12-29 DIAGNOSIS — Z13820 Encounter for screening for osteoporosis: Secondary | ICD-10-CM

## 2014-12-29 DIAGNOSIS — K219 Gastro-esophageal reflux disease without esophagitis: Secondary | ICD-10-CM

## 2014-12-29 DIAGNOSIS — N3281 Overactive bladder: Secondary | ICD-10-CM | POA: Diagnosis not present

## 2014-12-29 DIAGNOSIS — Z780 Asymptomatic menopausal state: Secondary | ICD-10-CM

## 2014-12-29 DIAGNOSIS — E785 Hyperlipidemia, unspecified: Secondary | ICD-10-CM | POA: Diagnosis not present

## 2014-12-29 LAB — POCT CBC
Granulocyte percent: 67.5 %G (ref 37–80)
HCT, POC: 43 % (ref 37.7–47.9)
HEMOGLOBIN: 13.3 g/dL (ref 12.2–16.2)
LYMPH, POC: 1.6 (ref 0.6–3.4)
MCH: 28 pg (ref 27–31.2)
MCHC: 30.8 g/dL — AB (ref 31.8–35.4)
MCV: 90.8 fL (ref 80–97)
MPV: 7.6 fL (ref 0–99.8)
POC Granulocyte: 4.2 (ref 2–6.9)
POC LYMPH %: 26.1 % (ref 10–50)
Platelet Count, POC: 243 10*3/uL (ref 142–424)
RBC: 4.74 M/uL (ref 4.04–5.48)
RDW, POC: 13.1 %
WBC: 6.2 10*3/uL (ref 4.6–10.2)

## 2014-12-29 LAB — POCT UA - MICROSCOPIC ONLY
BACTERIA, U MICROSCOPIC: NEGATIVE
CASTS, UR, LPF, POC: NEGATIVE
Crystals, Ur, HPF, POC: NEGATIVE
RBC, URINE, MICROSCOPIC: NEGATIVE
Yeast, UA: NEGATIVE

## 2014-12-29 LAB — POCT URINALYSIS DIPSTICK
Bilirubin, UA: NEGATIVE
Blood, UA: NEGATIVE
GLUCOSE UA: NEGATIVE
KETONES UA: NEGATIVE
LEUKOCYTES UA: NEGATIVE
Nitrite, UA: NEGATIVE
Protein, UA: NEGATIVE
Spec Grav, UA: 1.025
Urobilinogen, UA: NEGATIVE
pH, UA: 6.5

## 2014-12-29 MED ORDER — DICLOFENAC SODIUM 75 MG PO TBEC: 75.0000 mg | DELAYED_RELEASE_TABLET | Freq: Two times a day (BID) | ORAL | Status: DC

## 2014-12-29 MED ORDER — ALBUTEROL SULFATE HFA 108 (90 BASE) MCG/ACT IN AERS
2.0000 | INHALATION_SPRAY | Freq: Four times a day (QID) | RESPIRATORY_TRACT | Status: DC | PRN
Start: 2014-12-29 — End: 2016-07-02

## 2014-12-29 NOTE — Progress Notes (Signed)
Subjective:    Patient ID: Stacie Russell, female    DOB: [birth year], 64 y.o.   MRN: [id number]  HPI Pt here for follow up and management of chronic medical problems which includes hypertension, hyperlipidemia, and metabolic syndrome. The patient has morbid obesity. She is complaining today of increased urgency and back pain. She also has a history of an overactive bladder is taking Toviaz for this. The patient is having some right-sided low back pain and did not note any injury which could up later role in causing this. She is also having the frequency and urgency and occasional some discomfort with voiding. She is seeing the urologist in the past and has been tried on several medications for overactive bladder one of which was [name] as mentioned and she is currently taking Vesicare. She denies chest pain or shortness of breath. She has had some problems swallowing at times depending on what she is eating and how well she should use the food up. She does have an upcoming colonoscopy scheduled and we will discuss her swallowing issues with the gastroenterologist in case he needs to do an endoscopy. She has occasional heartburn and indigestion but has seen no blood in the stool or blood in the urine. She is gotten her eyes checked regularly.       Patient Active Problem List   Diagnosis Date Noted  . Chest pain 10/22/2013  . Dysphagia, pharyngoesophageal phase 10/22/2013  . Family history of malignant neoplasm of gastrointestinal tract 10/22/2013  . Metabolic syndrome 05/19/1218  . Severe obesity (BMI >= 40) 05/07/2013  . Essential hypertension, benign 11/03/2012  . Unspecified vitamin D deficiency 11/03/2012  . Knee pain   . Endometrial hyperplasia   . Hemorrhoid   . Foot pain   . Esophageal stricture   . Diverticulosis   . Hip pain   . Gastritis   . Tendonitis of ankle   . Hiatal hernia   . Hyperlipidemia   . Asthma   . GERD 10/03/2009   Outpatient Encounter Prescriptions  as of 12/29/2014  Medication Sig  . albuterol (PROVENTIL HFA;VENTOLIN HFA) 108 (90 BASE) MCG/ACT inhaler Inhale 2 puffs into the lungs every 6 (six) hours as needed for wheezing.  Marland Kitchen aspirin 81 MG tablet Take 81 mg by mouth daily.  . budesonide-formoterol (SYMBICORT) 80-4.5 MCG/ACT inhaler Inhale 2 puffs into the lungs 2 (two) times daily.  . Cholecalciferol (VITAMIN D3) 3000 UNITS TABS Take 1 capsule by mouth daily.  . diclofenac (VOLTAREN) 75 MG EC tablet Take 75 mg by mouth 2 (two) times daily.  . fesoterodine (TOVIAZ) 8 MG TB24 tablet Take 8 mg by mouth daily.  Marland Kitchen losartan-hydrochlorothiazide (HYZAAR) 50-12.5 MG per tablet TAKE ONE TABLET BY MOUTH ONCE DAILY  . Na Sulfate-K Sulfate-Mg Sulf (SUPREP BOWEL PREP) SOLN Use as directed  . Omega-3 Fatty Acids ([employer] NATURAL FISH OIL) 1000 MG CAPS Take 2 capsules by mouth daily.  . pantoprazole (PROTONIX) 40 MG tablet TAKE ONE TABLET BY MOUTH ONCE DAILY BEFORE  BREAKFAST   No facility-administered encounter medications on file as of 12/29/2014.      Review of Systems  Constitutional: Negative.   HENT: Negative.   Eyes: Negative.   Respiratory: Negative.   Cardiovascular: Negative.   Gastrointestinal: Negative.   Endocrine: Negative.   Genitourinary: Positive for urgency.  Musculoskeletal: Positive for back pain.  Skin: Negative.   Allergic/Immunologic: Negative.   Neurological: Negative.   Hematological: Negative.   Psychiatric/Behavioral: Negative.  Objective:   Physical Exam  Constitutional: She is oriented to person, place, and time. She appears well-developed and well-nourished. No distress.  HENT:  Head: Normocephalic and atraumatic.  Right Ear: External ear normal.  Left Ear: External ear normal.  Mouth/Throat: Oropharynx is clear and moist.  Nasal congestion right greater than left  Eyes: Conjunctivae and EOM are normal. Pupils are equal, round, and reactive to light. Right eye exhibits no discharge. Left eye exhibits  no discharge. No scleral icterus.  Neck: Normal range of motion. Neck supple. No thyromegaly present.  No carotid bruits anterior cervical adenopathy  Cardiovascular: Normal rate, regular rhythm, normal heart sounds and intact distal pulses.   No murmur heard. At 72/m  Pulmonary/Chest: Effort normal and breath sounds normal. No respiratory distress. She has no wheezes. She has no rales. She exhibits no tenderness.  Clear anteriorly and posteriorly  Abdominal: Soft. Bowel sounds are normal. She exhibits no mass. There is tenderness. There is no rebound and no guarding.  Slight epigastric and slight suprapubic tenderness otherwise obesity without organ enlargement  Musculoskeletal: Normal range of motion. She exhibits no edema or tenderness.  Lymphadenopathy:    She has no cervical adenopathy.  Neurological: She is alert and oriented to person, place, and time. She has normal reflexes. No cranial nerve deficit.  Skin: Skin is warm and dry. No rash noted.  Psychiatric: She has a normal mood and affect. Her behavior is normal. Judgment and thought content normal.  Nursing note and vitals reviewed.  BP 148/80 mmHg  Pulse 66  Temp(Src) 97.7 F (36.5 C) (Oral)  Ht 5' 1"  (1.549 m)  Wt 228 lb (103.42 kg)  BMI 43.10 kg/m2  [facility] reading (PRIMARY) by  Dr. 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                                Results for orders placed or performed in visit on 12/29/14  POCT CBC  Result Value Ref Range   WBC 6.2 4.6 - 10.2 K/uL   Lymph, poc 1.6 0.6 - 3.4   POC LYMPH PERCENT 26.1 10 - 50 %L   POC Granulocyte 4.2 2 - 6.9   Granulocyte percent 67.5 37 - 80 %G   RBC 4.74 4.04 - 5.48 M/uL   Hemoglobin 13.3 12.2 - 16.2 g/dL   HCT, POC 43.0 37.7 - 47.9 %   MCV 90.8 80 - 97 fL   MCH, POC 28.0 27 - 31.2 pg   MCHC 30.8 (A) 31.8 - 35.4 g/dL   RDW, POC 13.1 %   Platelet Count, POC 243 142 - 424 K/uL   MPV 7.6 0 -  99.8 fL  POCT urinalysis dipstick  Result Value Ref Range   Color, UA gold    Clarity, UA clear    Glucose, UA negative    Bilirubin, UA negative    Ketones, UA negative    Spec Grav, UA 1.025    Blood, UA negative    pH, UA 6.5    Protein, UA negative    Urobilinogen, UA negative    Nitrite, UA negative    Leukocytes, UA Negative   POCT UA - Microscopic Only  Result Value Ref Range   WBC, Ur, HPF, POC occ    RBC, urine, microscopic negative    Bacteria, U Microscopic negative  Mucus, UA few    Epithelial cells, urine per micros few    Crystals, Ur, HPF, POC negative    Casts, Ur, LPF, POC negative    Yeast, UA negative    Patient is aware of urinalysis and CBC results The lumbosacral spine films basically showed degenerative changes at L4 and 5      Assessment & Plan:  1. Hyperlipidemia -The patient will continue with aggressive therapeutic lifestyle changes  pending results of lab work - POCT CBC - NMR, lipoprofile  2. Metabolic syndrome -She should continue with weight loss and aggressive therapeutic lifestyle changes - POCT CBC - BMP8+EGFR  3. Essential hypertension, benign -The blood pressure is elevated today and we continue to encourage her to watch her sodium intake and monitor her blood pressure readings at home and bring these to the office at each visit. She should continue with a weight loss regimen. - POCT CBC - BMP8+EGFR - Hepatic function panel  4. Gastroesophageal reflux disease, esophagitis presence not specified -The patient continues to take pantoprazole and still has some problems with swallowing and she will discuss this with the gastroenterologist to see if she needs to have a repeat endoscopy. - POCT CBC - Hepatic function panel  5. Vitamin D deficiency -Continue current vitamin D pending results of lab work - POCT CBC - Vit D  25 hydroxy (rtn osteoporosis monitoring)  6. Urinary urgency -We will check a urinalysis today for  infection - POCT CBC - POCT urinalysis dipstick - POCT UA - Microscopic Only - Urine culture  7. Severe obesity (BMI >= 40) -The patient was offered the opportunity to see the clinical pharmacist which she has done in the past and also reminded of weight watcher's that is being offered here at the office.  8. Overactive bladder -She is currently taking Vesicare and would like to try again some samples of myrbetrig if we have any  9. Right-sided low back pain with right-sided sciatica -The right sacroiliac area and to the right of this is slightly tender to palpation but reflexes were good bilaterally. - DG Lumbar Spine 2-3 Views; Future   Meds ordered this encounter  Medications  . DISCONTD: diclofenac (VOLTAREN) 75 MG EC tablet    Sig: Take 75 mg by mouth 2 (two) times daily.  . diclofenac (VOLTAREN) 75 MG EC tablet    Sig: Take 1 tablet (75 mg total) by mouth 2 (two) times daily.    Dispense:  60 tablet    Refill:  3   Patient Instructions                       Medicare Annual Wellness Visit  [facility] and the medical providers at [facility] strive to bring you the best medical care.  In doing so we not only want to address your current medical conditions and concerns but also to detect new conditions early and prevent illness, disease and health-related problems.    Medicare offers a yearly Wellness Visit which allows our clinical staff to assess your need for preventative services including immunizations, lifestyle education, counseling to decrease risk of preventable diseases and screening for fall risk and other medical concerns.    This visit is provided free of charge (no copay) for all Medicare recipients. The clinical pharmacists at [facility] have begun to conduct these Wellness Visits which will also include a thorough review of all your medications.    As you  primary medical provider recommend that you make an  appointment for your Annual Wellness Visit if you have not done so already this year.  You may set up this appointment before you leave today or you may call back ([phone]) and schedule an appointment.  Please make sure when you call that you mention that you are scheduling your Annual Wellness Visit with the clinical pharmacist so that the appointment may be made for the proper length of time.     Continue current medications. Continue good therapeutic lifestyle changes which include good diet and exercise. Fall precautions discussed with patient. If an FOBT was given today- please return it to our front desk. If you are over 52 years old - you may need Prevnar 65 or the adult Pneumonia vaccine.  Flu Shots are still available at our office. If you still haven't had one please call to set up a nurse visit to get one.   After your visit with [place] today you will receive a survey in the mail or online from [employer] regarding your care with [place]. Please take a moment to fill this out. Your feedback is very important to [place] as you can help [place] better understand your patient needs as well as improve your experience and satisfaction. WE CARE ABOUT YOU!!!   Consider joining Weight Watchers and continue being active at the recreation center. Drink plenty of water We will call you with the results of the lumbosacral spine films and lab work as soon as that becomes available If we have samples of myrbetrig we will let you try those to see if it helps with your overactive bladder Continue to watch sodium intake and monitor blood pressure readings at home   Arrie Senate MD

## 2014-12-29 NOTE — Patient Instructions (Addendum)
Medicare Annual Wellness Visit  [facility] and the medical providers at [facility] strive to bring you the best medical care.  In doing so we not only want to address your current medical conditions and concerns but also to detect new conditions early and prevent illness, disease and health-related problems.    Medicare offers a yearly Wellness Visit which allows our clinical staff to assess your need for preventative services including immunizations, lifestyle education, counseling to decrease risk of preventable diseases and screening for fall risk and other medical concerns.    This visit is provided free of charge (no copay) for all Medicare recipients. The clinical pharmacists at [facility] have begun to conduct these Wellness Visits which will also include a thorough review of all your medications.    As you primary medical provider recommend that you make an appointment for your Annual Wellness Visit if you have not done so already this year.  You may set up this appointment before you leave today or you may call back ([phone]) and schedule an appointment.  Please make sure when you call that you mention that you are scheduling your Annual Wellness Visit with the clinical pharmacist so that the appointment may be made for the proper length of time.     Continue current medications. Continue good therapeutic lifestyle changes which include good diet and exercise. Fall precautions discussed with patient. If an FOBT was given today- please return it to our front desk. If you are over 80 years old - you may need Prevnar 71 or the adult Pneumonia vaccine.  Flu Shots are still available at our office. If you still haven't had one please call to set up a nurse visit to get one.   After your visit with [place] today you will receive a survey in the mail or online from [employer] regarding your care with [place]. Please take a moment to  fill this out. Your feedback is very important to [place] as you can help [place] better understand your patient needs as well as improve your experience and satisfaction. WE CARE ABOUT YOU!!!   Consider joining Weight Watchers and continue being active at the recreation center. Drink plenty of water We will call you with the results of the lumbosacral spine films and lab work as soon as that becomes available If we have samples of myrbetrig we will let you try those to see if it helps with your overactive bladder Continue to watch sodium intake and monitor blood pressure readings at home

## 2014-12-30 ENCOUNTER — Other Ambulatory Visit: Payer: Self-pay | Admitting: *Deleted

## 2014-12-30 ENCOUNTER — Telehealth: Payer: Self-pay | Admitting: *Deleted

## 2014-12-30 LAB — BMP8+EGFR
BUN / CREAT RATIO: 21 (ref 11–26)
BUN: 16 mg/dL (ref 8–27)
CALCIUM: 9.4 mg/dL (ref 8.7–10.3)
CO2: 25 mmol/L (ref 18–29)
CREATININE: 0.77 mg/dL (ref 0.57–1.00)
Chloride: 102 mmol/L (ref 97–108)
GFR calc Af Amer: 94 mL/min/{1.73_m2} (ref >59)
GFR calc non Af Amer: 81 mL/min/{1.73_m2} (ref >59)
Glucose: 91 mg/dL (ref 65–99)
Potassium: 4 mmol/L (ref 3.5–5.2)
Sodium: 141 mmol/L (ref 134–144)

## 2014-12-30 LAB — NMR, LIPOPROFILE
Cholesterol: 196 mg/dL (ref 100–199)
HDL Cholesterol by NMR: 62 mg/dL (ref >39)
HDL PARTICLE NUMBER: 37.1 umol/L (ref >=30.5)
LDL Particle Number: 1415 nmol/L — ABNORMAL HIGH (ref <1000)
LDL Size: 21.3 nm (ref >20.5)
LDL-C: 119 mg/dL — ABNORMAL HIGH (ref 0–99)
SMALL LDL PARTICLE NUMBER: 456 nmol/L (ref <=527)
Triglycerides by NMR: 76 mg/dL (ref 0–149)

## 2014-12-30 LAB — HEPATIC FUNCTION PANEL
ALT: 27 IU/L (ref 0–32)
AST: 17 IU/L (ref 0–40)
Albumin: 4.2 g/dL (ref 3.6–4.8)
Alkaline Phosphatase: 83 IU/L (ref 39–117)
Bilirubin Total: 0.5 mg/dL (ref 0.0–1.2)
Bilirubin, Direct: 0.13 mg/dL (ref 0.00–0.40)
TOTAL PROTEIN: 6.4 g/dL (ref 6.0–8.5)

## 2014-12-30 LAB — VITAMIN D 25 HYDROXY (VIT D DEFICIENCY, FRACTURES): VIT D 25 HYDROXY: 35.1 ng/mL (ref 30.0–100.0)

## 2014-12-30 MED ORDER — LOSARTAN POTASSIUM-HCTZ 50-12.5 MG PO TABS: 1.0000 | ORAL_TABLET | Freq: Every day | ORAL | Status: DC

## 2014-12-30 NOTE — Telephone Encounter (Signed)
Pt notified of results Verbalizes understanding She stated she can not take statins but will do better with diet and exercise She also stated that she has not been taking Vit D regularly but will do better with this

## 2014-12-30 NOTE — Telephone Encounter (Signed)
-----   Message from Chipper Herb, MD sent at 12/30/2014  7:40 AM EDT ----- The blood sugar is good at 91. The creatinine, the most important kidney function test is within normal limits. The electrolytes including potassium are good. All liver function tests are within normal limits Cholesterol numbers with advanced lipid testing have a total LDL particle number that is higher than it has been over the past year at 1415. The LDL C is also elevated at 119. The triglycerides are good.------ please confirm if the patient has a problem with taking statin drugs. If not please start atorvastatin 10 mg #30 one daily at bedtime for cholesterol. This is generic and should be less than expensive now. She should continue to follow-up aggressive therapeutic lifestyle changes. She should have her liver function tests checked in 4-6 weeks. The vitamin D level is 35.1 and this is at the lower end of the normal range.----- please have the patient complete taking the 3000 units of vitamin D3 daily and increase to 5000 units daily when that is completed and we will recheck the vitamin D as well as the cholesterol at her next regular visit

## 2014-12-31 ENCOUNTER — Ambulatory Visit: Payer: BC Managed Care – PPO | Admitting: Family Medicine

## 2014-12-31 LAB — URINE CULTURE

## 2015-01-19 ENCOUNTER — Telehealth: Payer: Self-pay | Admitting: *Deleted

## 2015-01-19 MED ORDER — OXYBUTYNIN CHLORIDE 5 MG PO TABS: ORAL_TABLET | ORAL | Status: DC

## 2015-01-19 NOTE — Telephone Encounter (Signed)
Insurance will not coverage Toviaz 8mg  but will cover Ditropan.  Can we switch to this? If so witch dose of Ditropan?

## 2015-01-19 NOTE — Telephone Encounter (Signed)
Oxybutynin 5 mg 2-3 times daily as needed, #60 refill as needed

## 2015-01-19 NOTE — Telephone Encounter (Signed)
Med sent in. Patient aware.

## 2015-01-24 ENCOUNTER — Telehealth: Payer: Self-pay | Admitting: *Deleted

## 2015-01-24 MED ORDER — FLUCONAZOLE 150 MG PO TABS: ORAL_TABLET | ORAL | Status: DC

## 2015-01-24 NOTE — Telephone Encounter (Signed)
Sent to pharm and pt aware  

## 2015-01-24 NOTE — Telephone Encounter (Signed)
Diflucan 150 mg #2 tablets one stat and 1 week later if no improvement

## 2015-01-24 NOTE — Telephone Encounter (Signed)
Patient now has a yeast infection from recent abx, would like something sent in to [facility].

## 2015-01-24 NOTE — Telephone Encounter (Signed)
Pt is having some irritation - needs to speak with Estrella Alcaraz about it. (texted Kristen at home)   LM

## 2015-03-31 ENCOUNTER — Encounter: Payer: Self-pay | Admitting: Internal Medicine

## 2015-04-12 ENCOUNTER — Other Ambulatory Visit: Payer: Self-pay | Admitting: Internal Medicine

## 2015-04-13 ENCOUNTER — Telehealth: Payer: Self-pay | Admitting: *Deleted

## 2015-04-13 NOTE — Telephone Encounter (Signed)
Patient requesting refill on Pantoprazole. Chart review shows that Dr Carlean Purl refilled it yesterday. Patient notified.

## 2015-04-22 ENCOUNTER — Other Ambulatory Visit: Payer: Self-pay

## 2015-04-22 MED ORDER — OXYBUTYNIN CHLORIDE 5 MG PO TABS: ORAL_TABLET | ORAL | Status: DC

## 2015-04-22 MED ORDER — DICLOFENAC SODIUM 75 MG PO TBEC: 75.0000 mg | DELAYED_RELEASE_TABLET | Freq: Two times a day (BID) | ORAL | Status: DC

## 2015-05-05 ENCOUNTER — Encounter: Payer: Self-pay | Admitting: Internal Medicine

## 2015-05-05 ENCOUNTER — Ambulatory Visit (INDEPENDENT_AMBULATORY_CARE_PROVIDER_SITE_OTHER): Payer: Medicare Other | Admitting: Internal Medicine

## 2015-05-05 VITALS — BP 128/74 | HR 72 | Ht 61.0 in | Wt 219.2 lb

## 2015-05-05 DIAGNOSIS — Z800 Family history of malignant neoplasm of digestive organs: Secondary | ICD-10-CM

## 2015-05-05 DIAGNOSIS — R1314 Dysphagia, pharyngoesophageal phase: Secondary | ICD-10-CM

## 2015-05-05 DIAGNOSIS — K219 Gastro-esophageal reflux disease without esophagitis: Secondary | ICD-10-CM

## 2015-05-05 NOTE — Progress Notes (Signed)
   Subjective:    Patient ID: Stacie Russell, female    DOB: 12/28/1950, 64 y.o.   MRN: [id number] Cc: dysphagia, colon cancer screening  HPI This is a nice lady known to me from a visit last year she had GERD symptoms with heartburn and dysphagia and was due for a screening colonoscopy. She had difficulties with her insurance and costs so she deferred the colonoscopy and endoscopy are recommended. She is ready to schedule that today. She notes that pantoprazole has helped her GERD symptoms significantly but she still has to take antiacids occasionally. Medications, allergies, past medical history, past surgical history, family history and social history are reviewed and updated in the EMR.  Review of Systems As above    Objective:   Physical Exam @BP  128/74 mmHg  Pulse 72  Ht 5\' 1"  (1.549 m)  Wt 219 lb 4 oz (99.451 kg)  BMI 41.45 kg/m2@  General:  NAD Eyes:   anicteric Lungs:  clear Heart::  S1S2 no rubs, murmurs or gallops Abdomen:  soft and nontender, BS+ Ext:   no edema, cyanosis or clubbing    Data Reviewed:  GI visit from last year      Assessment & Plan:   1. Dysphagia, pharyngoesophageal phase   2. Gastroesophageal reflux disease, esophagitis presence not specified   3. Family history of colon cancer    Proceed with EGD possible social dilation and colonoscopy.The risks and benefits as well as alternatives of endoscopic procedure(s) have been discussed and reviewed. All questions answered. The patient agrees to proceed.

## 2015-05-05 NOTE — Patient Instructions (Signed)
   You have been scheduled for an endoscopy and colonoscopy. Please follow the written instructions given to you at your visit today. Please use the suprep kit you have at home to prep with.. If you use inhalers (even only as needed), please bring them with you on the day of your procedure.   I appreciate the opportunity to care for you. Silvano Rusk, MD, [facility]

## 2015-05-05 NOTE — Assessment & Plan Note (Signed)
Evaluate with EGD - possible esophageal dilation

## 2015-05-17 ENCOUNTER — Ambulatory Visit: Payer: Medicare Other | Admitting: Family Medicine

## 2015-06-10 ENCOUNTER — Other Ambulatory Visit: Payer: Self-pay | Admitting: Family Medicine

## 2015-06-20 ENCOUNTER — Encounter: Payer: Self-pay | Admitting: Family Medicine

## 2015-06-20 ENCOUNTER — Ambulatory Visit (INDEPENDENT_AMBULATORY_CARE_PROVIDER_SITE_OTHER): Payer: Medicare Other | Admitting: Family Medicine

## 2015-06-20 VITALS — BP 153/90 | HR 68 | Temp 97.3°F | Ht 61.0 in | Wt 220.0 lb

## 2015-06-20 DIAGNOSIS — K219 Gastro-esophageal reflux disease without esophagitis: Secondary | ICD-10-CM | POA: Diagnosis not present

## 2015-06-20 DIAGNOSIS — E8881 Metabolic syndrome: Secondary | ICD-10-CM | POA: Diagnosis not present

## 2015-06-20 DIAGNOSIS — I10 Essential (primary) hypertension: Secondary | ICD-10-CM

## 2015-06-20 DIAGNOSIS — Z23 Encounter for immunization: Secondary | ICD-10-CM | POA: Diagnosis not present

## 2015-06-20 DIAGNOSIS — E559 Vitamin D deficiency, unspecified: Secondary | ICD-10-CM | POA: Diagnosis not present

## 2015-06-20 DIAGNOSIS — E785 Hyperlipidemia, unspecified: Secondary | ICD-10-CM | POA: Diagnosis not present

## 2015-06-20 DIAGNOSIS — K449 Diaphragmatic hernia without obstruction or gangrene: Secondary | ICD-10-CM | POA: Diagnosis not present

## 2015-06-20 MED ORDER — PANTOPRAZOLE SODIUM 40 MG PO TBEC: DELAYED_RELEASE_TABLET | ORAL | Status: DC

## 2015-06-20 MED ORDER — LOSARTAN POTASSIUM-HCTZ 100-12.5 MG PO TABS: 1.0000 | ORAL_TABLET | Freq: Every day | ORAL | Status: DC

## 2015-06-20 MED ORDER — LOSARTAN POTASSIUM-HCTZ 50-12.5 MG PO TABS: 1.0000 | ORAL_TABLET | Freq: Every day | ORAL | Status: DC

## 2015-06-20 NOTE — Patient Instructions (Addendum)
Medicare Annual Wellness Visit  [facility] and the medical providers at [facility] strive to bring you the best medical care.  In doing so we not only want to address your current medical conditions and concerns but also to detect new conditions early and prevent illness, disease and health-related problems.    Medicare offers a yearly Wellness Visit which allows our clinical staff to assess your need for preventative services including immunizations, lifestyle education, counseling to decrease risk of preventable diseases and screening for fall risk and other medical concerns.    This visit is provided free of charge (no copay) for all Medicare recipients. The clinical pharmacists at [facility] have begun to conduct these Wellness Visits which will also include a thorough review of all your medications.    As you primary medical provider recommend that you make an appointment for your Annual Wellness Visit if you have not done so already this year.  You may set up this appointment before you leave today or you may call back ([phone]) and schedule an appointment.  Please make sure when you call that you mention that you are scheduling your Annual Wellness Visit with the clinical pharmacist so that the appointment may be made for the proper length of time.     Continue current medications. Continue good therapeutic lifestyle changes which include good diet and exercise. Fall precautions discussed with patient. If an FOBT was given today- please return it to our front desk. If you are over 26 years old - you may need Prevnar 88 or the adult Pneumonia vaccine.  **Flu shots are available--- please call and schedule a FLU-CLINIC appointment**  After your visit with [place] today you will receive a survey in the mail or online from [employer] regarding your care with [place]. Please take a moment to fill this out. Your feedback is very  important to [place] as you can help [place] better understand your patient needs as well as improve your experience and satisfaction. WE CARE ABOUT YOU!!!   The patient should return to the office for fasting lab work She should increase her blood pressure medication as She should continue to watch her sodium intake and work on losing as much weight as possible  She should wear support hose put these on the first thing with arising from the bed in the morning She should follow-up with the gastroenterologist to get her colonoscopy and endoscopy She should get her mammogram and pelvic exam She should return to the clinic in a couple of weeks and bring blood pressure readings with her so the nurse can check her blood pressure here and get a BMP

## 2015-06-20 NOTE — Progress Notes (Signed)
Subjective:    Patient ID: Stacie Russell, female    DOB: 1951/06/30, 64 y.o.   MRN: [id number]  HPI Pt here for follow up and management of chronic medical problems which includes hyperlipidemia and hypertension. She is taking medications regularly. The patient has an appointment scheduled for endoscopy and colonoscopy with her gastroenterologist later this month. She is also planning to see her gynecologist for her pelvic exam and mammogram. She has end-stage knee disease bilaterally and has seen orthopedic surgeon and continues to take Voltaren for this. She understands that this can irritate her stomach cause fluid retention and raise her blood pressure and affect the kidney function. She denies chest pain shortness of breath heartburn indigestion nausea vomiting diarrhea or blood in the stool. She is passing her water without problems. She does have some edema in the right knee area and the left lower ankle area.      Patient Active Problem List   Diagnosis Date Noted  . Overactive bladder 12/29/2014  . Chest pain 10/22/2013  . Dysphagia, pharyngoesophageal phase 10/22/2013  . Family history of malignant neoplasm of gastrointestinal tract 10/22/2013  . Metabolic syndrome 29/93/7169  . Severe obesity (BMI >= 40) ([place]) 05/07/2013  . Essential hypertension, benign 11/03/2012  . Unspecified vitamin D deficiency 11/03/2012  . Knee pain   . Endometrial hyperplasia   . Hemorrhoid   . Foot pain   . Esophageal stricture   . Diverticulosis   . Hip pain   . Gastritis   . Tendonitis of ankle   . Hiatal hernia   . Hyperlipidemia   . Asthma   . GERD 10/03/2009   Outpatient Encounter Prescriptions as of 06/20/2015  Medication Sig  . albuterol (PROVENTIL HFA;VENTOLIN HFA) 108 (90 BASE) MCG/ACT inhaler Inhale 2 puffs into the lungs every 6 (six) hours as needed for wheezing.  Marland Kitchen aspirin 81 MG tablet Take 81 mg by mouth daily.  . budesonide-formoterol (SYMBICORT) 80-4.5 MCG/ACT inhaler  Inhale 2 puffs into the lungs 2 (two) times daily.  . Cholecalciferol (VITAMIN D3) 3000 UNITS TABS Take 1 capsule by mouth daily.  . diclofenac (VOLTAREN) 75 MG EC tablet Take 1 tablet by mouth two  times daily  . losartan-hydrochlorothiazide (HYZAAR) 50-12.5 MG tablet Take 1 tablet by mouth daily.  . Omega-3 Fatty Acids ([employer] NATURAL FISH OIL) 1000 MG CAPS Take 2 capsules by mouth daily.  Marland Kitchen oxybutynin (DITROPAN) 5 MG tablet Take 1 tablet 2-3 times daily as needed  . pantoprazole (PROTONIX) 40 MG tablet TAKE ONE TABLET BY MOUTH ONCE DAILY BEFORE BREAKFAST  . [DISCONTINUED] losartan-hydrochlorothiazide (HYZAAR) 50-12.5 MG per tablet Take 1 tablet by mouth daily.  . [DISCONTINUED] pantoprazole (PROTONIX) 40 MG tablet TAKE ONE TABLET BY MOUTH ONCE DAILY BEFORE BREAKFAST   No facility-administered encounter medications on file as of 06/20/2015.      Review of Systems  Constitutional: Negative.   HENT: Negative.   Eyes: Negative.   Respiratory: Negative.   Cardiovascular: Negative.        Left leg swells at times  Gastrointestinal: Negative.   Endocrine: Negative.   Genitourinary: Negative.   Musculoskeletal: Negative.   Skin: Negative.   Allergic/Immunologic: Negative.   Neurological: Negative.   Hematological: Negative.   Psychiatric/Behavioral: Negative.        Objective:   Physical Exam  Constitutional: She is oriented to person, place, and time. She appears well-developed and well-nourished. No distress.  Pleasant and alert  HENT:  Head: Normocephalic and atraumatic.  Right  Ear: External ear normal.  Left Ear: External ear normal.  Nose: Nose normal.  Mouth/Throat: Oropharynx is clear and moist. No oropharyngeal exudate.  Eyes: Conjunctivae and EOM are normal. Pupils are equal, round, and reactive to light. Right eye exhibits no discharge. Left eye exhibits no discharge. No scleral icterus.  Neck: Normal range of motion. Neck supple. No thyromegaly present.   No bruits no  thyromegaly  Cardiovascular: Normal rate, regular rhythm, normal heart sounds and intact distal pulses.   No murmur heard. At 72/m  Pulmonary/Chest: Effort normal and breath sounds normal. No respiratory distress. She has no wheezes. She has no rales. She exhibits no tenderness.  Clear anteriorly and posteriorly  Abdominal: Soft. Bowel sounds are normal. She exhibits no mass. There is no tenderness. There is no rebound and no guarding.  Morbid obesity without spleen or liver enlargement and no inguinal adenopathy or masses detected. There was some epigastric tenderness.  Musculoskeletal: Normal range of motion. She exhibits tenderness. She exhibits no edema.  Both knees have bilateral joint line tenderness  Lymphadenopathy:    She has no cervical adenopathy.  Neurological: She is alert and oriented to person, place, and time.  Skin: Skin is warm and dry. No rash noted.  Psychiatric: She has a normal mood and affect. Her behavior is normal. Judgment and thought content normal.  Nursing note and vitals reviewed.   BP 153/90 mmHg  Pulse 68  Temp(Src) 97.3 F (36.3 C) (Oral)  Ht 5' 1"  (1.549 m)  Wt 220 lb (99.791 kg)  BMI 41.59 kg/m2  Repeat blood pressure also elevated at 170/100. This was sitting and in the right arm.     Assessment & Plan:  1. Hyperlipidemia -The patient will return to the office for fasting blood work. She will continue to work aggressively with her diet to lose weight. Weight watchers was offered to her today. [place] the beach [place] and this may have played a role with some of the weight gain that she had previously lost. She is intent on doing better. - CBC with Differential/Platelet; Future - Hepatic function panel; Future - NMR, lipoprofile; Future  2. Metabolic syndrome -Continue with aggressive therapeutic lifestyle changes - BMP8+EGFR; Future - CBC with Differential/Platelet; Future  3. Essential hypertension, benign -Continue to  watch sodium intake and we will increase her low Sartin to 100/12.5 from 50/12.5 - BMP8+EGFR; Future - CBC with Differential/Platelet; Future - Hepatic function panel; Future  4. Gastroesophageal reflux disease, esophagitis presence not specified -She does have some epigastric tenderness and she will continue with her proton pump inhibitor and is planning to get an endoscopy soon. - CBC with Differential/Platelet; Future - Hepatic function panel; Future  5. Vitamin D deficiency -Continue vitamin D replacement pending results of lab work - CBC with Differential/Platelet; Future - Vit D  25 hydroxy (rtn osteoporosis monitoring); Future  6. Hiatal hernia -Minimal complaints with this as long as she continues with her proton pump inhibitor. She understands that the diclofenac may be aggravating any upper GI symptoms that she has.  7. Morbid obesity due to excess calories ([place]) -Continue with aggressive weight loss and call us if there is anything we can do to help her with this including weight watcher's.  Meds ordered this encounter  Medications  . pantoprazole (PROTONIX) 40 MG tablet    Sig: TAKE ONE TABLET BY MOUTH ONCE DAILY BEFORE BREAKFAST    Dispense:  90 tablet    Refill:  3  . DISCONTD: losartan-hydrochlorothiazide (HYZAAR) 50-12.5 MG tablet    Sig: Take 1 tablet by mouth daily.    Dispense:  90 tablet    Refill:  3  . losartan-hydrochlorothiazide (HYZAAR) 100-12.5 MG tablet    Sig: Take 1 tablet by mouth daily.    Dispense:  90 tablet    Refill:  3   Patient Instructions                       Medicare Annual Wellness Visit  [facility] and the medical providers at [facility] strive to bring you the best medical care.  In doing so we not only want to address your current medical conditions and concerns but also to detect new conditions early and prevent illness, disease and health-related problems.    Medicare offers a yearly Wellness Visit which  allows our clinical staff to assess your need for preventative services including immunizations, lifestyle education, counseling to decrease risk of preventable diseases and screening for fall risk and other medical concerns.    This visit is provided free of charge (no copay) for all Medicare recipients. The clinical pharmacists at [facility] have begun to conduct these Wellness Visits which will also include a thorough review of all your medications.    As you primary medical provider recommend that you make an appointment for your Annual Wellness Visit if you have not done so already this year.  You may set up this appointment before you leave today or you may call back ([phone]) and schedule an appointment.  Please make sure when you call that you mention that you are scheduling your Annual Wellness Visit with the clinical pharmacist so that the appointment may be made for the proper length of time.     Continue current medications. Continue good therapeutic lifestyle changes which include good diet and exercise. Fall precautions discussed with patient. If an FOBT was given today- please return it to our front desk. If you are over 57 years old - you may need Prevnar 57 or the adult Pneumonia vaccine.  **Flu shots are available--- please call and schedule a FLU-CLINIC appointment**  After your visit with [place] today you will receive a survey in the mail or online from [employer] regarding your care with [place]. Please take a moment to fill this out. Your feedback is very important to [place] as you can help [place] better understand your patient needs as well as improve your experience and satisfaction. WE CARE ABOUT YOU!!!   The patient should return to the office for fasting lab work She should increase her blood pressure medication as She should continue to watch her sodium intake and work on losing as much weight as possible  She should wear support hose put these on the first  thing with arising from the bed in the morning She should follow-up with the gastroenterologist to get her colonoscopy and endoscopy She should get her mammogram and pelvic exam She should return to the clinic in a couple of weeks and bring blood pressure readings with her so the nurse can check her blood pressure here and get a BMP   Arrie Senate MD

## 2015-06-21 ENCOUNTER — Encounter (INDEPENDENT_AMBULATORY_CARE_PROVIDER_SITE_OTHER): Payer: Self-pay

## 2015-06-21 ENCOUNTER — Other Ambulatory Visit (INDEPENDENT_AMBULATORY_CARE_PROVIDER_SITE_OTHER): Payer: Medicare Other

## 2015-06-21 DIAGNOSIS — K219 Gastro-esophageal reflux disease without esophagitis: Secondary | ICD-10-CM | POA: Diagnosis not present

## 2015-06-21 DIAGNOSIS — E785 Hyperlipidemia, unspecified: Secondary | ICD-10-CM

## 2015-06-21 DIAGNOSIS — I10 Essential (primary) hypertension: Secondary | ICD-10-CM | POA: Diagnosis not present

## 2015-06-21 DIAGNOSIS — E8881 Metabolic syndrome: Secondary | ICD-10-CM

## 2015-06-21 DIAGNOSIS — E559 Vitamin D deficiency, unspecified: Secondary | ICD-10-CM | POA: Diagnosis not present

## 2015-06-21 NOTE — Progress Notes (Signed)
Lab only 

## 2015-06-22 LAB — CBC WITH DIFFERENTIAL/PLATELET
BASOS ABS: 0 10*3/uL (ref 0.0–0.2)
Basos: 0 %
EOS (ABSOLUTE): 0.2 10*3/uL (ref 0.0–0.4)
Eos: 3 %
HEMOGLOBIN: 14 g/dL (ref 11.1–15.9)
Hematocrit: 41.6 % (ref 34.0–46.6)
IMMATURE GRANS (ABS): 0 10*3/uL (ref 0.0–0.1)
Immature Granulocytes: 0 %
LYMPHS ABS: 1.4 10*3/uL (ref 0.7–3.1)
Lymphs: 23 %
MCH: 30.4 pg (ref 26.6–33.0)
MCHC: 33.7 g/dL (ref 31.5–35.7)
MCV: 90 fL (ref 79–97)
MONOCYTES: 8 %
Monocytes Absolute: 0.5 10*3/uL (ref 0.1–0.9)
NEUTROS ABS: 4 10*3/uL (ref 1.4–7.0)
Neutrophils: 66 %
Platelets: 223 10*3/uL (ref 150–379)
RBC: 4.61 x10E6/uL (ref 3.77–5.28)
RDW: 14 % (ref 12.3–15.4)
WBC: 6.2 10*3/uL (ref 3.4–10.8)

## 2015-06-22 LAB — BMP8+EGFR
BUN / CREAT RATIO: 21 (ref 11–26)
BUN: 15 mg/dL (ref 8–27)
CHLORIDE: 102 mmol/L (ref 97–106)
CO2: 26 mmol/L (ref 18–29)
Calcium: 9.7 mg/dL (ref 8.7–10.3)
Creatinine, Ser: 0.71 mg/dL (ref 0.57–1.00)
GFR calc Af Amer: 103 mL/min/{1.73_m2} (ref >59)
GFR calc non Af Amer: 90 mL/min/{1.73_m2} (ref >59)
GLUCOSE: 89 mg/dL (ref 65–99)
POTASSIUM: 4.4 mmol/L (ref 3.5–5.2)
SODIUM: 143 mmol/L (ref 136–144)

## 2015-06-22 LAB — NMR, LIPOPROFILE
CHOLESTEROL: 191 mg/dL (ref 100–199)
HDL Cholesterol by NMR: 53 mg/dL (ref >39)
HDL PARTICLE NUMBER: 35.2 umol/L (ref >=30.5)
LDL Particle Number: 1634 nmol/L — ABNORMAL HIGH (ref <1000)
LDL Size: 21.5 nm (ref >20.5)
LDL-C: 117 mg/dL — AB (ref 0–99)
LP-IR SCORE: 30 (ref <=45)
Small LDL Particle Number: 549 nmol/L — ABNORMAL HIGH (ref <=527)
Triglycerides by NMR: 103 mg/dL (ref 0–149)

## 2015-06-22 LAB — HEPATIC FUNCTION PANEL
ALBUMIN: 4.3 g/dL (ref 3.6–4.8)
ALK PHOS: 88 IU/L (ref 39–117)
ALT: 21 IU/L (ref 0–32)
AST: 16 IU/L (ref 0–40)
Bilirubin Total: 0.6 mg/dL (ref 0.0–1.2)
Bilirubin, Direct: 0.17 mg/dL (ref 0.00–0.40)
TOTAL PROTEIN: 6.7 g/dL (ref 6.0–8.5)

## 2015-06-22 LAB — VITAMIN D 25 HYDROXY (VIT D DEFICIENCY, FRACTURES): Vit D, 25-Hydroxy: 36.5 ng/mL (ref 30.0–100.0)

## 2015-06-24 ENCOUNTER — Encounter: Payer: Self-pay | Admitting: Internal Medicine

## 2015-07-04 ENCOUNTER — Ambulatory Visit (AMBULATORY_SURGERY_CENTER): Payer: Medicare Other | Admitting: Internal Medicine

## 2015-07-04 ENCOUNTER — Encounter: Payer: Self-pay | Admitting: Internal Medicine

## 2015-07-04 VITALS — BP 165/84 | HR 74 | Temp 96.7°F | Resp 13 | Ht 61.0 in | Wt 219.0 lb

## 2015-07-04 DIAGNOSIS — R1310 Dysphagia, unspecified: Secondary | ICD-10-CM

## 2015-07-04 DIAGNOSIS — K317 Polyp of stomach and duodenum: Secondary | ICD-10-CM

## 2015-07-04 DIAGNOSIS — I10 Essential (primary) hypertension: Secondary | ICD-10-CM | POA: Diagnosis not present

## 2015-07-04 DIAGNOSIS — Z800 Family history of malignant neoplasm of digestive organs: Secondary | ICD-10-CM | POA: Insufficient documentation

## 2015-07-04 DIAGNOSIS — R1319 Other dysphagia: Secondary | ICD-10-CM

## 2015-07-04 DIAGNOSIS — K219 Gastro-esophageal reflux disease without esophagitis: Secondary | ICD-10-CM

## 2015-07-04 DIAGNOSIS — R1314 Dysphagia, pharyngoesophageal phase: Secondary | ICD-10-CM | POA: Diagnosis not present

## 2015-07-04 DIAGNOSIS — Z1211 Encounter for screening for malignant neoplasm of colon: Secondary | ICD-10-CM | POA: Diagnosis not present

## 2015-07-04 MED ORDER — SODIUM CHLORIDE 0.9 % IV SOLN: 500.0000 mL | INTRAVENOUS | Status: DC

## 2015-07-04 NOTE — Progress Notes (Signed)
Called to room to assist during endoscopic procedure.  Patient ID and intended procedure confirmed with present staff. Received instructions for my participation in the procedure from the performing physician.  

## 2015-07-04 NOTE — Op Note (Signed)
[place]  [address] [place], [address]   COLONOSCOPY PROCEDURE REPORT  PATIENT: Stacie, Russell  MR#: [id number] BIRTHDATE: [birth year] , 72  yrs. old GENDER: female ENDOSCOPIST: Gatha Mayer, MD, [facility] PROCEDURE DATE:  07/04/2015 PROCEDURE:   Colonoscopy, screening First Screening Colonoscopy - Avg.  risk and is 50 yrs.  old or older - No.  Prior Negative Screening - Now for repeat screening. N/A  History of Adenoma - Now for follow-up colonoscopy & has been > or = to 3 yrs.  N/A  Polyps removed today? No Recommend repeat exam, <10 yrs? Yes high risk ASA CLASS:   Class III INDICATIONS:Screening for colonic neoplasia and FH Colon or Rectal Adenocarcinoma. MEDICATIONS: Propofol 130 mg IV, Monitored anesthesia care, and Residual sedation present  DESCRIPTION OF PROCEDURE:   After the risks benefits and alternatives of the procedure were thoroughly explained, informed consent was obtained.  The digital rectal exam revealed no abnormalities of the rectum.   The LB [id number]  endoscope was introduced through the anus and advanced to the cecum, which was identified by both the appendix and ileocecal valve. No adverse events experienced.   The quality of the prep was excellent. (MiraLax was used)  The instrument was then slowly withdrawn as the colon was fully examined. Estimated blood loss is zero unless otherwise noted in this procedure report.      COLON FINDINGS: There was mild diverticulosis noted in the sigmoid colon.   The examination was otherwise normal.  Retroflexed views revealed no abnormalities. The time to cecum = 4.1 Withdrawal time = 7.5   The scope was withdrawn and the procedure completed. COMPLICATIONS: There were no immediate complications.  ENDOSCOPIC IMPRESSION: 1.   Mild diverticulosis was noted in the sigmoid colon 2.   The examination was otherwise normal  RECOMMENDATIONS: Repeat colonoscopy in 5 years.  FHx CRCA -  mom in her 72's  eSigned:  Gatha Mayer, MD, [facility] 07/04/2015 2:19 PM   cc: Morrie Sheldon, MD and The Patient

## 2015-07-04 NOTE — Op Note (Signed)
[place]  [address] [place], [address]   ENDOSCOPY PROCEDURE REPORT  PATIENT: Stacie Russell, Stacie Russell  MR#: [id number] BIRTHDATE: [birth year] , 81  yrs. old GENDER: female ENDOSCOPIST: Gatha Mayer, MD, [facility] PROCEDURE DATE:  07/04/2015 PROCEDURE:  EGD w/ biopsy and Maloney dilation of esophagus ASA CLASS:     Class III INDICATIONS:  dysphagia. MEDICATIONS: Propofol 230 mg IV and Monitored anesthesia care TOPICAL ANESTHETIC: none  DESCRIPTION OF PROCEDURE: After the risks benefits and alternatives of the procedure were thoroughly explained, informed consent was obtained.  The [id number] endoscope was introduced through the mouth and advanced to the second portion of the duodenum , Without limitations.  The instrument was slowly withdrawn as the mucosa was fully examined.    1) Multiple soft, fleshy subcentimeter polyps in gastric body - look like fundic gland polyps - biopsies taken 2) Otherwise normal EGD - 54 Fr Maloney dilator passed to treat dysphagia.  Retroflexed views revealed no abnormalities.     The scope was then withdrawn from the patient and the procedure completed.  COMPLICATIONS: There were no immediate complications.  ENDOSCOPIC IMPRESSION: 1) Multiple soft, fleshy subcentimeter polyps in gastric body - look like fundic gland polyps - biopsies taken 2) Otherwise normal EGD - 54 Fr Maloney dilator passed to treat dysphagia  RECOMMENDATIONS: 1.  Clear liquids until 3 PM , then soft foods rest of day.  Resume prior diet tomorrow. 2.  Await pathology results 3.  Proceed with a Colonoscopy.  eSigned:  Gatha Mayer, MD, [facility] 07/04/2015 2:15 PM    CC:Don Laurance Flatten, MD and The Patient

## 2015-07-04 NOTE — Progress Notes (Signed)
To recovery, report to [place], RN, VSS 

## 2015-07-04 NOTE — Patient Instructions (Signed)
YOU HAD AN ENDOSCOPIC PROCEDURE TODAY AT [facility]:   Refer to the procedure report that was given to you for any specific questions about what was found during the examination.  If the procedure report does not answer your questions, please call your gastroenterologist to clarify.  If you requested that your care partner not be given the details of your procedure findings, then the procedure report has been included in a sealed envelope for you to review at your convenience later.  YOU SHOULD EXPECT: Some feelings of bloating in the abdomen. Passage of more gas than usual.  Walking can help get rid of the air that was put into your GI tract during the procedure and reduce the bloating. If you had a lower endoscopy (such as a colonoscopy or flexible sigmoidoscopy) you may notice spotting of blood in your stool or on the toilet paper. If you underwent a bowel prep for your procedure, you may not have a normal bowel movement for a few days.  Please Note:  You might notice some irritation and congestion in your nose or some drainage.  This is from the oxygen used during your procedure.  There is no need for concern and it should clear up in a day or so.  SYMPTOMS TO REPORT IMMEDIATELY:   Following lower endoscopy (colonoscopy or flexible sigmoidoscopy):  Excessive amounts of blood in the stool  Significant tenderness or worsening of abdominal pains  Swelling of the abdomen that is new, acute  Fever of 100F or higher   Following upper endoscopy (EGD)  Vomiting of blood or coffee ground material  New chest pain or pain under the shoulder blades  Painful or persistently difficult swallowing  New shortness of breath  Fever of 100F or higher  Black, tarry-looking stools  For urgent or emergent issues, a gastroenterologist can be reached at any hour by calling [phone].   DIET: follow dilatation diet    ACTIVITY:  You should plan to take it easy for the rest of today  and you should NOT DRIVE or use heavy machinery until tomorrow (because of the sedation medicines used during the test).    FOLLOW UP: Our staff will call the number listed on your records the next business day following your procedure to check on you and address any questions or concerns that you may have regarding the information given to you following your procedure. If we do not reach you, we will leave a message.  However, if you are feeling well and you are not experiencing any problems, there is no need to return our call.  We will assume that you have returned to your regular daily activities without incident.  If any biopsies were taken you will be contacted by phone or by letter within the next 1-3 weeks.  Please call us at [phone] if you have not heard about the biopsies in 3 weeks.    SIGNATURES/CONFIDENTIALITY: You and/or your care partner have signed paperwork which will be entered into your electronic medical record.  These signatures attest to the fact that that the information above on your After Visit Summary has been reviewed and is understood.  Full responsibility of the confidentiality of this discharge information lies with you and/or your care-partner.   Follow dilatation diet given to you today   Information on diverticulosis given to you today

## 2015-07-05 ENCOUNTER — Telehealth: Payer: Self-pay | Admitting: *Deleted

## 2015-07-05 NOTE — Telephone Encounter (Signed)
  Follow up Call-  Call back number 07/04/2015  Post procedure Call Back phone  # [phone]  Permission to leave phone message Yes     Patient questions:  Do you have a fever, pain , or abdominal swelling? No. Pain Score  0 *  Have you tolerated food without any problems? Yes.    Have you been able to return to your normal activities? Yes.    Do you have any questions about your discharge instructions: Diet   No. Medications  No. Follow up visit  No.  Do you have questions or concerns about your Care? No.  Actions: * If pain score is 4 or above: No action needed, pain <4.

## 2015-07-10 ENCOUNTER — Encounter: Payer: Self-pay | Admitting: Internal Medicine

## 2015-07-10 DIAGNOSIS — K317 Polyp of stomach and duodenum: Secondary | ICD-10-CM | POA: Insufficient documentation

## 2015-07-10 DIAGNOSIS — D131 Benign neoplasm of stomach: Secondary | ICD-10-CM

## 2015-07-10 HISTORY — DX: Benign neoplasm of stomach: D13.1

## 2015-07-11 ENCOUNTER — Other Ambulatory Visit: Payer: Self-pay | Admitting: Family Medicine

## 2015-08-29 ENCOUNTER — Other Ambulatory Visit: Payer: Self-pay | Admitting: Family Medicine

## 2015-12-13 DIAGNOSIS — M5431 Sciatica, right side: Secondary | ICD-10-CM | POA: Diagnosis not present

## 2015-12-13 DIAGNOSIS — M9903 Segmental and somatic dysfunction of lumbar region: Secondary | ICD-10-CM | POA: Diagnosis not present

## 2015-12-20 ENCOUNTER — Ambulatory Visit: Payer: Medicare Other | Admitting: Family Medicine

## 2015-12-21 ENCOUNTER — Ambulatory Visit (INDEPENDENT_AMBULATORY_CARE_PROVIDER_SITE_OTHER): Payer: Medicare Other | Admitting: Family Medicine

## 2015-12-21 ENCOUNTER — Encounter: Payer: Self-pay | Admitting: Family Medicine

## 2015-12-21 ENCOUNTER — Other Ambulatory Visit: Payer: Self-pay | Admitting: *Deleted

## 2015-12-21 VITALS — BP 116/67 | HR 67 | Temp 97.3°F | Ht 61.0 in | Wt 212.8 lb

## 2015-12-21 DIAGNOSIS — E8881 Metabolic syndrome: Secondary | ICD-10-CM

## 2015-12-21 DIAGNOSIS — M25551 Pain in right hip: Secondary | ICD-10-CM | POA: Diagnosis not present

## 2015-12-21 DIAGNOSIS — M25562 Pain in left knee: Secondary | ICD-10-CM | POA: Diagnosis not present

## 2015-12-21 DIAGNOSIS — Z800 Family history of malignant neoplasm of digestive organs: Secondary | ICD-10-CM

## 2015-12-21 DIAGNOSIS — R229 Localized swelling, mass and lump, unspecified: Secondary | ICD-10-CM | POA: Diagnosis not present

## 2015-12-21 DIAGNOSIS — E785 Hyperlipidemia, unspecified: Secondary | ICD-10-CM | POA: Diagnosis not present

## 2015-12-21 DIAGNOSIS — N3281 Overactive bladder: Secondary | ICD-10-CM | POA: Diagnosis not present

## 2015-12-21 DIAGNOSIS — I10 Essential (primary) hypertension: Secondary | ICD-10-CM

## 2015-12-21 DIAGNOSIS — E559 Vitamin D deficiency, unspecified: Secondary | ICD-10-CM

## 2015-12-21 DIAGNOSIS — M25561 Pain in right knee: Secondary | ICD-10-CM | POA: Diagnosis not present

## 2015-12-21 DIAGNOSIS — K219 Gastro-esophageal reflux disease without esophagitis: Secondary | ICD-10-CM

## 2015-12-21 DIAGNOSIS — IMO0002 Reserved for concepts with insufficient information to code with codable children: Secondary | ICD-10-CM

## 2015-12-21 NOTE — Patient Instructions (Signed)
We will arrange for a special x-ray to further evaluate the lites in the left side of your thigh that are giving him more trouble. Continue with aggressive therapeutic lifestyle changes and weight loss For insomnia, you can try melatonin, 3 mg 1 nightly. This is over-the-counter. It helped some people and doesn't help others. You can purchase ranitidine 150 mg over-the-counter, the equate brand and take this in addition to the proton X4 reflux on an as-needed basis. It should be taken before the meal. Consider le croix. Is it naturally sweeten drink that has no calories Dr. 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

## 2015-12-21 NOTE — Progress Notes (Signed)
Subjective:    Patient ID: Stacie Russell, female    DOB: 19-Aug-1950, 65 y.o.   MRN: [id number]  HPI Patient is here today for her 6 month follow up on her hyperlipidemia and hypertension. She also has a little knot under the skin on her left leg. The patient is doing well overall. She does not need any refills. Her BMI remains greater than 40 and she has more than 2 comorbid medical conditions. She is in need of getting lab work and would be given an FOBT to return. The patient actually had a colonoscopy in November of this past year and will not need an FOBT until after that time. She denies any chest pain or shortness of breath. She has no trouble swallowing except she does have occasional breakthrough heartburn especially with overeating or eating certain types of foods. She is already taking proton X and this works fairly well for her heartburn indigestion. She denies any blood in the stool or black tarry bowel movements. She's had a recent colonoscopy that was normal. Retrospectively she does have occasional bright red blood and this was noticed even after the colonoscopy and thinks it may be due to a hemorrhoid problem. She is passing her water without problems. She does complain of right hip pain radiating around to her growing and she is seen the chiropractor about this and he thinks it is sciatica. She also has some knee pain.   Review of Systems  Constitutional: Negative.   HENT: Negative.   Eyes: Negative.   Respiratory: Negative.   Cardiovascular: Negative.   Gastrointestinal: Negative.   Endocrine: Negative.   Genitourinary: Negative.   Musculoskeletal: Negative.   Skin:       Knot on left leg.   Allergic/Immunologic: Negative.   Neurological: Negative.   Hematological: Negative.   Psychiatric/Behavioral: Negative.          Patient Active Problem List   Diagnosis Date Noted  . Fundic gland polyps of stomach, benign 07/10/2015  . Family history of colon cancer in  mother - dx 88's 07/04/2015  . Overactive bladder 12/29/2014  . Chest pain 10/22/2013  . Dysphagia, pharyngoesophageal phase 10/22/2013  . Family history of malignant neoplasm of gastrointestinal tract 10/22/2013  . Metabolic syndrome 77/41/2878  . Severe obesity (BMI >= 40) ([facility]) 05/07/2013  . Essential hypertension, benign 11/03/2012  . Unspecified vitamin D deficiency 11/03/2012  . Knee pain   . Endometrial hyperplasia   . Hemorrhoid   . Foot pain   . Esophageal stricture   . Diverticulosis   . Hip pain   . Gastritis   . Tendonitis of ankle   . Hiatal hernia   . Hyperlipidemia   . Asthma   . GERD 10/03/2009   Outpatient Encounter Prescriptions as of 12/21/2015  Medication Sig  . albuterol (PROVENTIL HFA;VENTOLIN HFA) 108 (90 BASE) MCG/ACT inhaler Inhale 2 puffs into the lungs every 6 (six) hours as needed for wheezing.  Marland Kitchen aspirin 81 MG tablet Take 81 mg by mouth daily.  . budesonide-formoterol (SYMBICORT) 80-4.5 MCG/ACT inhaler Inhale 2 puffs into the lungs 2 (two) times daily.  . Cholecalciferol (VITAMIN D3) 3000 UNITS TABS Take 1 capsule by mouth daily.  . diclofenac (VOLTAREN) 75 MG EC tablet Take 1 tablet by mouth two  times daily  . losartan-hydrochlorothiazide (HYZAAR) 100-12.5 MG tablet Take 1 tablet by mouth daily.  . Omega-3 Fatty Acids ([employer] NATURAL FISH OIL) 1000 MG CAPS Take 2 capsules by mouth daily.  Marland Kitchen  oxybutynin (DITROPAN) 5 MG tablet TAKE 1 TABLET BY MOUTH 2-3  TIMES DAILY AS NEEDED  . pantoprazole (PROTONIX) 40 MG tablet TAKE ONE TABLET BY MOUTH ONCE DAILY BEFORE BREAKFAST   No facility-administered encounter medications on file as of 12/21/2015.       Objective:   Physical Exam  Constitutional: She is oriented to person, place, and time. She appears well-developed and well-nourished. No distress.  HENT:  Head: Normocephalic and atraumatic.  Right Ear: External ear normal.  Left Ear: External ear normal.  Nose: Nose normal.  Mouth/Throat: Oropharynx is  clear and moist.  Eyes: Conjunctivae and EOM are normal. Pupils are equal, round, and reactive to light. Right eye exhibits no discharge. Left eye exhibits no discharge. No scleral icterus.  Neck: Normal range of motion. Neck supple. No thyromegaly present.  No bruits adenopathy or thyroid enlargement  Cardiovascular: Normal rate, regular rhythm, normal heart sounds and intact distal pulses.   No murmur heard. Pulmonary/Chest: Effort normal and breath sounds normal. No respiratory distress. She has no wheezes. She has no rales. She exhibits no tenderness.  Clear anteriorly and posteriorly  Abdominal: Soft. Bowel sounds are normal. She exhibits no mass. There is no tenderness. There is no rebound and no guarding.  Abdominal obesity without bruits or organ enlargement or masses  Musculoskeletal: Normal range of motion. She exhibits no edema or tenderness.  Lymphadenopathy:    She has no cervical adenopathy.  Neurological: She is alert and oriented to person, place, and time. She has normal reflexes. No cranial nerve deficit.  Skin: Skin is warm and dry. No rash noted.  There are 2 areas that are lumpy in nature on the left lateral upper thigh. These are slightly tender to palpation. The patient is concerned about these and we will schedule ultrasound to further evaluate these areas.  Psychiatric: She has a normal mood and affect. Her behavior is normal. Judgment and thought content normal.  Nursing note and vitals reviewed.  BP 116/67 mmHg  Pulse 67  Temp(Src) 97.3 F (36.3 C) (Oral)  Ht 5' 1"  (1.549 m)  Wt 212 lb 12.8 oz (96.525 kg)  BMI 40.23 kg/m2        Assessment & Plan:  1. Hyperlipidemia -Continue omega-3 fatty acids and aggressive therapeutic lifestyle changes to help lower her weight and improved cholesterol - CMP14+EGFR - NMR, lipoprofile  2. Metabolic syndrome -Work aggressively on weight with diet and exercise  3. Essential hypertension, benign -Blood pressure is  good today and continue with current treatment - CBC with Differential/Platelet  4. Gastroesophageal reflux disease, esophagitis presence not specified -Continue with proton X and add a ranitidine on a when necessary basis prior to the dinner meal if needed  5. Vitamin D deficiency -Continue vitamin D replacement pending results of lab work - VITAMIN D 25 Hydroxy (Vit-D Deficiency, Fractures)  6. Family history of colon cancer in mother - dx 79's -Continue with colonoscopies every 5 years  61. Overactive bladder -Continue current treatment  8. Morbid obesity due to excess calories ([place]) -Continue with aggressive therapeutic lifestyle changes to get weight down. Patient is to be congratulated as she is also her 20 pounds over the past year.  9. Arthralgia of both knees -Continue to work on weight loss  10. Right hip pain -If problems with hip and groin continue consider LS spine films  Patient Instructions  We will arrange for a special x-ray to further evaluate the lites in the left side of your thigh  that are giving him more trouble. Continue with aggressive therapeutic lifestyle changes and weight loss For insomnia, you can try melatonin, 3 mg 1 nightly. This is over-the-counter. It helped some people and doesn't help others. You can purchase ranitidine 150 mg over-the-counter, the equate brand and take this in addition to the proton X4 reflux on an as-needed basis. It should be taken before the meal. Consider le croix. Is it naturally sweeten drink that has no calories Dr. 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

## 2015-12-22 LAB — CBC WITH DIFFERENTIAL/PLATELET
BASOS ABS: 0 10*3/uL (ref 0.0–0.2)
Basos: 0 %
EOS (ABSOLUTE): 0.4 10*3/uL (ref 0.0–0.4)
Eos: 6 %
Hematocrit: 38.8 % (ref 34.0–46.6)
Hemoglobin: 12.8 g/dL (ref 11.1–15.9)
Immature Grans (Abs): 0 10*3/uL (ref 0.0–0.1)
Immature Granulocytes: 0 %
LYMPHS ABS: 2.2 10*3/uL (ref 0.7–3.1)
Lymphs: 35 %
MCH: 30.3 pg (ref 26.6–33.0)
MCHC: 33 g/dL (ref 31.5–35.7)
MCV: 92 fL (ref 79–97)
Monocytes Absolute: 0.4 10*3/uL (ref 0.1–0.9)
Monocytes: 7 %
NEUTROS ABS: 3.2 10*3/uL (ref 1.4–7.0)
Neutrophils: 52 %
PLATELETS: 240 10*3/uL (ref 150–379)
RBC: 4.22 x10E6/uL (ref 3.77–5.28)
RDW: 13.8 % (ref 12.3–15.4)
WBC: 6.2 10*3/uL (ref 3.4–10.8)

## 2015-12-22 LAB — CMP14+EGFR
A/G RATIO: 1.8 (ref 1.2–2.2)
ALBUMIN: 4.2 g/dL (ref 3.6–4.8)
ALK PHOS: 80 IU/L (ref 39–117)
ALT: 20 IU/L (ref 0–32)
AST: 13 IU/L (ref 0–40)
BILIRUBIN TOTAL: 0.3 mg/dL (ref 0.0–1.2)
BUN / CREAT RATIO: 25 (ref 12–28)
BUN: 17 mg/dL (ref 8–27)
CHLORIDE: 103 mmol/L (ref 96–106)
CO2: 24 mmol/L (ref 18–29)
Calcium: 9.1 mg/dL (ref 8.7–10.3)
Creatinine, Ser: 0.68 mg/dL (ref 0.57–1.00)
GFR calc non Af Amer: 91 mL/min/{1.73_m2} (ref >59)
GFR, EST AFRICAN AMERICAN: 105 mL/min/{1.73_m2} (ref >59)
Globulin, Total: 2.3 g/dL (ref 1.5–4.5)
Glucose: 86 mg/dL (ref 65–99)
POTASSIUM: 4.3 mmol/L (ref 3.5–5.2)
Sodium: 142 mmol/L (ref 134–144)
TOTAL PROTEIN: 6.5 g/dL (ref 6.0–8.5)

## 2015-12-22 LAB — NMR, LIPOPROFILE
Cholesterol: 172 mg/dL (ref 100–199)
HDL Cholesterol by NMR: 58 mg/dL (ref >39)
HDL Particle Number: 35.9 umol/L (ref >=30.5)
LDL PARTICLE NUMBER: 1272 nmol/L — AB (ref <1000)
LDL SIZE: 21.7 nm (ref >20.5)
LDL-C: 98 mg/dL (ref 0–99)
Small LDL Particle Number: 252 nmol/L (ref <=527)
Triglycerides by NMR: 78 mg/dL (ref 0–149)

## 2015-12-22 LAB — VITAMIN D 25 HYDROXY (VIT D DEFICIENCY, FRACTURES): VIT D 25 HYDROXY: 46.3 ng/mL (ref 30.0–100.0)

## 2015-12-28 ENCOUNTER — Ambulatory Visit (HOSPITAL_COMMUNITY)
Admission: RE | Admit: 2015-12-28 | Discharge: 2015-12-28 | Disposition: A | Discharge status: Home / Self Care | Payer: Medicare Other | Source: Ambulatory Visit | Attending: Family Medicine | Admitting: Family Medicine

## 2015-12-28 DIAGNOSIS — IMO0002 Reserved for concepts with insufficient information to code with codable children: Secondary | ICD-10-CM

## 2015-12-28 DIAGNOSIS — R229 Localized swelling, mass and lump, unspecified: Secondary | ICD-10-CM | POA: Diagnosis not present

## 2015-12-28 DIAGNOSIS — R2242 Localized swelling, mass and lump, left lower limb: Secondary | ICD-10-CM | POA: Diagnosis not present

## 2016-01-14 ENCOUNTER — Other Ambulatory Visit: Payer: Self-pay | Admitting: Family Medicine

## 2016-03-06 ENCOUNTER — Ambulatory Visit (INDEPENDENT_AMBULATORY_CARE_PROVIDER_SITE_OTHER): Payer: Medicare Other | Admitting: Orthopaedic Surgery

## 2016-03-06 ENCOUNTER — Encounter: Payer: Self-pay | Admitting: Orthopaedic Surgery

## 2016-03-06 VITALS — BP 130/71 | Ht 62.0 in | Wt 207.0 lb

## 2016-03-06 DIAGNOSIS — M25561 Pain in right knee: Secondary | ICD-10-CM

## 2016-03-06 DIAGNOSIS — M79672 Pain in left foot: Secondary | ICD-10-CM | POA: Diagnosis not present

## 2016-03-06 NOTE — Progress Notes (Signed)
° °  HPI ° ° °ROS:  °Review of Systems ° ° ° °

## 2016-03-06 NOTE — Progress Notes (Addendum)
   Subjective: My right knee hurts and my left foot hurts    Patient ID: Stacie Russell, female    DOB: [birth year], 65 y.o.   MRN: [id number]  HPI She has pain of the right knee. It is long standing.  She has no trauma, no giving way, no redness.  She is planning to go to [place] on August 5.  She wants an injection in the knee as she has heard it helps.  She has tried ice, rest, heat, rubs and Advil with little help.  She has some swelling and times and has some popping.  She also has a problem with the left foot with excess callus on the bottom of her foot.  She wants the area "shaved down."  She has no trauma.  She has inserts for her shoes.  She has no redness.   Review of Systems  HENT: Negative for congestion.   Respiratory: Negative for cough and shortness of breath.   Cardiovascular: Negative for chest pain and leg swelling.  Endocrine: Positive for cold intolerance.  Musculoskeletal: Positive for arthralgias, gait problem and joint swelling.  Allergic/Immunologic: Positive for environmental allergies.       Objective:   Physical Exam  Constitutional: She is oriented to person, place, and time. She appears well-developed and well-nourished.  HENT:  Head: Normocephalic and atraumatic.  Eyes: Conjunctivae and EOM are normal. Pupils are equal, round, and reactive to light.  Neck: Normal range of motion. Neck supple.  Cardiovascular: Normal rate and intact distal pulses.   Pulmonary/Chest: Effort normal.  Abdominal: Soft.  Musculoskeletal: She exhibits tenderness (Pain right knee with slight effusion, NV intact. ROM 0 to 110, crepitus, stable, left knee negative.  Left foot with loss metatarsal arch and callus over third metatarsal head area.).  Neurological: She is alert and oriented to person, place, and time. She has normal reflexes. She displays normal reflexes. No cranial nerve deficit. She exhibits abnormal muscle tone. Coordination normal.  Skin: Skin is warm and dry.   Psychiatric: She has a normal mood and affect. Her behavior is normal. Judgment and thought content normal.   I pared the callus on the left foot.  PROCEDURE NOTE:  The patient requests injections of the right knee , verbal consent was obtained.  The right knee was prepped appropriately after time out was performed.   Sterile technique was observed and injection of 1 cc of Depo-Medrol 40 mg with several cc's of plain xylocaine. Anesthesia was provided by ethyl chloride and a 20-gauge needle was used to inject the knee area. The injection was tolerated well.  A band aid dressing was applied.  The patient was advised to apply ice later today and tomorrow to the injection sight as needed.        Assessment & Plan:   Encounter Diagnoses  Name Primary?  . Right knee pain Yes  . Left foot pain    Return as needed.  Call if any problem.  Precautions discussed.  Electronically Signed Sanjuana Kava, MD 7/25/20179:30 AM

## 2016-04-02 ENCOUNTER — Telehealth: Payer: Self-pay | Admitting: Family Medicine

## 2016-04-02 MED ORDER — LOSARTAN POTASSIUM-HCTZ 100-12.5 MG PO TABS: 1.0000 | ORAL_TABLET | Freq: Every day | ORAL | 0 refills | Status: DC

## 2016-04-02 NOTE — Telephone Encounter (Signed)
Me sent to local [phone] until mail order gets here

## 2016-04-03 ENCOUNTER — Other Ambulatory Visit: Payer: Self-pay | Admitting: *Deleted

## 2016-04-03 ENCOUNTER — Other Ambulatory Visit: Payer: Self-pay | Admitting: Family Medicine

## 2016-04-03 MED ORDER — DICLOFENAC SODIUM 75 MG PO TBEC: 75.0000 mg | DELAYED_RELEASE_TABLET | Freq: Two times a day (BID) | ORAL | 0 refills | Status: DC

## 2016-05-09 ENCOUNTER — Other Ambulatory Visit: Payer: Self-pay | Admitting: Family Medicine

## 2016-06-05 DIAGNOSIS — M7742 Metatarsalgia, left foot: Secondary | ICD-10-CM | POA: Diagnosis not present

## 2016-06-05 DIAGNOSIS — M7752 Other enthesopathy of left foot: Secondary | ICD-10-CM | POA: Diagnosis not present

## 2016-07-02 ENCOUNTER — Ambulatory Visit (INDEPENDENT_AMBULATORY_CARE_PROVIDER_SITE_OTHER): Payer: Medicare Other

## 2016-07-02 ENCOUNTER — Ambulatory Visit (INDEPENDENT_AMBULATORY_CARE_PROVIDER_SITE_OTHER): Payer: Medicare Other | Admitting: Family Medicine

## 2016-07-02 ENCOUNTER — Encounter: Payer: Self-pay | Admitting: Family Medicine

## 2016-07-02 VITALS — BP 133/75 | HR 57 | Temp 97.1°F | Ht 62.0 in | Wt 214.0 lb

## 2016-07-02 DIAGNOSIS — E559 Vitamin D deficiency, unspecified: Secondary | ICD-10-CM

## 2016-07-02 DIAGNOSIS — I10 Essential (primary) hypertension: Secondary | ICD-10-CM

## 2016-07-02 DIAGNOSIS — K219 Gastro-esophageal reflux disease without esophagitis: Secondary | ICD-10-CM | POA: Diagnosis not present

## 2016-07-02 DIAGNOSIS — E7800 Pure hypercholesterolemia, unspecified: Secondary | ICD-10-CM

## 2016-07-02 DIAGNOSIS — R252 Cramp and spasm: Secondary | ICD-10-CM

## 2016-07-02 DIAGNOSIS — Z800 Family history of malignant neoplasm of digestive organs: Secondary | ICD-10-CM | POA: Diagnosis not present

## 2016-07-02 DIAGNOSIS — E8881 Metabolic syndrome: Secondary | ICD-10-CM

## 2016-07-02 MED ORDER — ALBUTEROL SULFATE HFA 108 (90 BASE) MCG/ACT IN AERS: 2.0000 | INHALATION_SPRAY | Freq: Four times a day (QID) | RESPIRATORY_TRACT | 3 refills | Status: DC | PRN

## 2016-07-02 NOTE — Patient Instructions (Addendum)
Medicare Annual Wellness Visit  [facility] and the medical providers at [facility] strive to bring you the best medical care.  In doing so we not only want to address your current medical conditions and concerns but also to detect new conditions early and prevent illness, disease and health-related problems.    Medicare offers a yearly Wellness Visit which allows our clinical staff to assess your need for preventative services including immunizations, lifestyle education, counseling to decrease risk of preventable diseases and screening for fall risk and other medical concerns.    This visit is provided free of charge (no copay) for all Medicare recipients. The clinical pharmacists at [facility] have begun to conduct these Wellness Visits which will also include a thorough review of all your medications.    As you primary medical provider recommend that you make an appointment for your Annual Wellness Visit if you have not done so already this year.  You may set up this appointment before you leave today or you may call back [phone] and schedule an appointment.  Please make sure when you call that you mention that you are scheduling your Annual Wellness Visit with the clinical pharmacist so that the appointment may be made for the proper length of time.     Continue current medications. Continue good therapeutic lifestyle changes which include good diet and exercise. Fall precautions discussed with patient. If an FOBT was given today- please return it to our front desk. If you are over 67 years old - you may need Prevnar 58 or the adult Pneumonia vaccine.  **Flu shots are available--- please call and schedule a FLU-CLINIC appointment**  After your visit with [place] today you will receive a survey in the mail or online from [employer] regarding your care with [place]. Please take a moment to fill this out. Your feedback is very  important to [place] as you can help [place] better understand your patient needs as well as improve your experience and satisfaction. WE CARE ABOUT YOU!!!   It is important that the patient get her mammogram. The flu shot she received today may make her arm sore. We will call with lab work results and chest x-ray results as soon as those results become available Do not forget to get your eye exam early next year Use albuterol as needed Follow-up with colonoscopy in 2 more years as planned by the gastroenterologist

## 2016-07-02 NOTE — Progress Notes (Signed)
Subjective:    Patient ID: Stacie Russell, female    DOB: 1951-01-02, 65 y.o.   MRN: [id number]  HPI  Pt here for follow up and management of chronic medical problems which includes Hyperlipidemia and hypertension. She is taking medications regularly.The patient is doing well today overall. She does not have any specific complaints. She is requesting a refill on her albuterol. She is due to return an FOBT and get lab work and a chest x-ray. She is also due to get her mammogram and get her flu shot. The patient denies any chest pain or shortness of breath. She's been doing well with her bronchospasm and only uses albuterol as needed. She denies any problems with blood in the stool or black tarry bowel movements. She does have some gas and fullness in her chest but this is been better since an endoscopic dilatation about a year ago. This discomfort is not related to physical activity. Her stools are normal in caliber. She is passing her water without problems. She does complain of some medial thigh pain at nighttime when sleeping. She says she drinks 4 bottles of 16 ounce water size daily. She was encouraged to keep drinking plenty of fluids. She is due to get her eye exam early next year and is also planning to get her mammogram soon.    Patient Active Problem List   Diagnosis Date Noted  . Fundic gland polyps of stomach, benign 07/10/2015  . Family history of colon cancer in mother - dx 95's 07/04/2015  . Overactive bladder 12/29/2014  . Chest pain 10/22/2013  . Dysphagia, pharyngoesophageal phase 10/22/2013  . Family history of malignant neoplasm of gastrointestinal tract 10/22/2013  . Metabolic syndrome 21/19/4174  . Severe obesity (BMI >= 40) ([facility]) 05/07/2013  . Essential hypertension, benign 11/03/2012  . Vitamin D deficiency 11/03/2012  . Knee pain   . Endometrial hyperplasia   . Hemorrhoid   . Foot pain   . Esophageal stricture   . Diverticulosis   . Hip pain   . Gastritis     . Tendonitis of ankle   . Hiatal hernia   . Hyperlipidemia   . Asthma   . GERD 10/03/2009   Outpatient Encounter Prescriptions as of 07/02/2016  Medication Sig  . albuterol (PROVENTIL HFA;VENTOLIN HFA) 108 (90 BASE) MCG/ACT inhaler Inhale 2 puffs into the lungs every 6 (six) hours as needed for wheezing. (Patient not taking: Reported on 03/06/2016)  . aspirin 81 MG tablet Take 81 mg by mouth daily.  . budesonide-formoterol (SYMBICORT) 80-4.5 MCG/ACT inhaler Inhale 2 puffs into the lungs 2 (two) times daily.  . Cholecalciferol (VITAMIN D3) 3000 UNITS TABS Take 1 capsule by mouth daily.  . diclofenac (VOLTAREN) 75 MG EC tablet Take 1 tablet (75 mg total) by mouth 2 (two) times daily.  Marland Kitchen losartan-hydrochlorothiazide (HYZAAR) 100-12.5 MG tablet Take 1 tablet by mouth daily.  Marland Kitchen losartan-hydrochlorothiazide (HYZAAR) 100-12.5 MG tablet TAKE 1 TABLET BY MOUTH  DAILY  . Omega-3 Fatty Acids ([employer] NATURAL FISH OIL) 1000 MG CAPS Take 2 capsules by mouth daily.  Marland Kitchen oxybutynin (DITROPAN) 5 MG tablet TAKE 1 TABLET BY MOUTH 2-3  TIMES DAILY AS NEEDED  . pantoprazole (PROTONIX) 40 MG tablet TAKE ONE TABLET BY MOUTH  ONCE DAILY BEFORE BREAKFAST   No facility-administered encounter medications on file as of 07/02/2016.      Review of Systems  Constitutional: Negative.   HENT: Negative.   Eyes: Negative.   Respiratory: Negative.   Cardiovascular:  Negative.   Gastrointestinal: Negative.   Endocrine: Negative.   Genitourinary: Negative.   Musculoskeletal: Negative.   Skin: Negative.   Allergic/Immunologic: Negative.   Neurological: Negative.   Hematological: Negative.   Psychiatric/Behavioral: Negative.        Objective:   Physical Exam  Constitutional: She is oriented to person, place, and time. She appears well-developed and well-nourished. No distress.  The patient is alert and pleasant in her demeanor.  HENT:  Head: Normocephalic and atraumatic.  Right Ear: External ear normal.  Left Ear:  External ear normal.  Nose: Nose normal.  Mouth/Throat: Oropharynx is clear and moist.  Eyes: Conjunctivae and EOM are normal. Pupils are equal, round, and reactive to light. Right eye exhibits no discharge. Left eye exhibits no discharge. No scleral icterus.  Neck: Normal range of motion. Neck supple. No JVD present. No thyromegaly present.  No bruits or thyromegaly  Cardiovascular: Normal rate, regular rhythm, normal heart sounds and intact distal pulses.   No murmur heard. The heart is regular at 60/m  Pulmonary/Chest: Effort normal and breath sounds normal. No respiratory distress. She has no wheezes. She has no rales.  Clear anteriorly and posteriorly with no wheezes  Abdominal: Soft. Bowel sounds are normal. She exhibits no mass. There is no tenderness. There is no rebound and no guarding.  There is obesity. No abdominal tenderness masses or organ enlargement  Musculoskeletal: Normal range of motion. She exhibits no edema.  Lymphadenopathy:    She has no cervical adenopathy.  Neurological: She is alert and oriented to person, place, and time. She has normal reflexes. No cranial nerve deficit.  Skin: Skin is warm and dry. No rash noted.  Psychiatric: She has a normal mood and affect. Her behavior is normal. Judgment and thought content normal.  The patient is pleasant and calm.  Nursing note and vitals reviewed.  BP 133/75 (BP Location: Left Arm)   Pulse (!) 57   Temp 97.1 F (36.2 C) (Oral)   Ht _0  (1.575 m)   Wt 214 lb (97.1 kg)   SpO2 97%   BMI 39.14 kg/m         Assessment & Plan:  1-hyperlipidemia -Continue with omega-3 fatty acids and aggressive therapeutic lifestyle changes which include diet and exercise pending results of lipid profile. - CBC with Differential/Platelet - NMR, lipoprofile  2. Metabolic syndrome -The patient is actually in the category of morbid obesity because she has 2 or more health issues in conjunction with her BMI of 37.9. - CBC with  Differential/Platelet  3. Essential hypertension, benign -The blood pressure is good today and she will continue with current treatment - BMP8+EGFR - CBC with Differential/Platelet - Hepatic function panel - DG Chest 2 View; Future  4. Gastroesophageal reflux disease, esophagitis presence not specified -Continue with proton X and make efforts to try to switch to ranitidine 150 mg twice daily before breakfast and supper. If she takes her Voltaren for her arthritis she should make sure she takes it after eating. - CBC with Differential/Platelet  5. Vitamin D deficiency -Continue current treatment pending results of lab work - CBC with Differential/Platelet - VITAMIN D 25 Hydroxy (Vit-D Deficiency, Fractures)  6. Bilateral leg cramps -Continue to drink plenty of water and fluids during the day.  7. Bronchospasm -Continue with albuterol as needed  8. Positive family history for colon cancer  Meds ordered this encounter  Medications  . albuterol (PROVENTIL HFA;VENTOLIN HFA) 108 (90 Base) MCG/ACT inhaler    Sig:  Inhale 2 puffs into the lungs every 6 (six) hours as needed for wheezing.    Dispense:  3 Inhaler    Refill:  3   Patient Instructions                       Medicare Annual Wellness Visit  [facility] and the medical providers at [facility] strive to bring you the best medical care.  In doing so we not only want to address your current medical conditions and concerns but also to detect new conditions early and prevent illness, disease and health-related problems.    Medicare offers a yearly Wellness Visit which allows our clinical staff to assess your need for preventative services including immunizations, lifestyle education, counseling to decrease risk of preventable diseases and screening for fall risk and other medical concerns.    This visit is provided free of charge (no copay) for all Medicare recipients. The clinical pharmacists at [facility] have begun to conduct these Wellness Visits which will also include a thorough review of all your medications.    As you primary medical provider recommend that you make an appointment for your Annual Wellness Visit if you have not done so already this year.  You may set up this appointment before you leave today or you may call back ([phone]) and schedule an appointment.  Please make sure when you call that you mention that you are scheduling your Annual Wellness Visit with the clinical pharmacist so that the appointment may be made for the proper length of time.     Continue current medications. Continue good therapeutic lifestyle changes which include good diet and exercise. Fall precautions discussed with patient. If an FOBT was given today- please return it to our front desk. If you are over 53 years old - you may need Prevnar 24 or the adult Pneumonia vaccine.  **Flu shots are available--- please call and schedule a FLU-CLINIC appointment**  After your visit with [place] today you will receive a survey in the mail or online from [employer] regarding your care with [place]. Please take a moment to fill this out. Your feedback is very important to [place] as you can help [place] better understand your patient needs as well as improve your experience and satisfaction. WE CARE ABOUT YOU!!!   It is important that the patient get her mammogram. The flu shot she received today may make her arm sore. We will call with lab work results and chest x-ray results as soon as those results become available Do not forget to get your eye exam early next year Use albuterol as needed Follow-up with colonoscopy in 2 more years as planned by the gastroenterologist    Arrie Senate MD

## 2016-07-03 LAB — BMP8+EGFR
BUN/Creatinine Ratio: 21 (ref 12–28)
BUN: 14 mg/dL (ref 8–27)
CALCIUM: 9.2 mg/dL (ref 8.7–10.3)
CO2: 26 mmol/L (ref 18–29)
Chloride: 103 mmol/L (ref 96–106)
Creatinine, Ser: 0.66 mg/dL (ref 0.57–1.00)
GFR, EST AFRICAN AMERICAN: 106 mL/min/{1.73_m2} (ref >59)
GFR, EST NON AFRICAN AMERICAN: 92 mL/min/{1.73_m2} (ref >59)
Glucose: 79 mg/dL (ref 65–99)
POTASSIUM: 3.7 mmol/L (ref 3.5–5.2)
SODIUM: 143 mmol/L (ref 134–144)

## 2016-07-03 LAB — CBC WITH DIFFERENTIAL/PLATELET
BASOS: 0 %
Basophils Absolute: 0 10*3/uL (ref 0.0–0.2)
EOS (ABSOLUTE): 0.3 10*3/uL (ref 0.0–0.4)
Eos: 5 %
HEMATOCRIT: 39.1 % (ref 34.0–46.6)
Hemoglobin: 12.8 g/dL (ref 11.1–15.9)
Immature Grans (Abs): 0 10*3/uL (ref 0.0–0.1)
Immature Granulocytes: 0 %
Lymphocytes Absolute: 1.9 10*3/uL (ref 0.7–3.1)
Lymphs: 34 %
MCH: 30.5 pg (ref 26.6–33.0)
MCHC: 32.7 g/dL (ref 31.5–35.7)
MCV: 93 fL (ref 79–97)
MONOS ABS: 0.4 10*3/uL (ref 0.1–0.9)
Monocytes: 6 %
NEUTROS ABS: 3 10*3/uL (ref 1.4–7.0)
Neutrophils: 55 %
PLATELETS: 225 10*3/uL (ref 150–379)
RBC: 4.19 x10E6/uL (ref 3.77–5.28)
RDW: 13.8 % (ref 12.3–15.4)
WBC: 5.5 10*3/uL (ref 3.4–10.8)

## 2016-07-03 LAB — NMR, LIPOPROFILE
Cholesterol: 172 mg/dL (ref 100–199)
HDL CHOLESTEROL BY NMR: 59 mg/dL (ref >39)
HDL Particle Number: 38.8 umol/L (ref >=30.5)
LDL Particle Number: 1262 nmol/L — ABNORMAL HIGH (ref <1000)
LDL Size: 21.1 nm (ref >20.5)
LDL-C: 96 mg/dL (ref 0–99)
SMALL LDL PARTICLE NUMBER: 580 nmol/L — AB (ref <=527)
Triglycerides by NMR: 84 mg/dL (ref 0–149)

## 2016-07-03 LAB — HEPATIC FUNCTION PANEL
ALT: 23 IU/L (ref 0–32)
AST: 18 IU/L (ref 0–40)
Albumin: 4.1 g/dL (ref 3.6–4.8)
Alkaline Phosphatase: 75 IU/L (ref 39–117)
BILIRUBIN, DIRECT: 0.14 mg/dL (ref 0.00–0.40)
Bilirubin Total: 0.5 mg/dL (ref 0.0–1.2)
Total Protein: 6.5 g/dL (ref 6.0–8.5)

## 2016-07-03 LAB — VITAMIN D 25 HYDROXY (VIT D DEFICIENCY, FRACTURES): VIT D 25 HYDROXY: 36 ng/mL (ref 30.0–100.0)

## 2016-07-17 ENCOUNTER — Ambulatory Visit (INDEPENDENT_AMBULATORY_CARE_PROVIDER_SITE_OTHER): Payer: Medicare Other

## 2016-07-17 ENCOUNTER — Ambulatory Visit (INDEPENDENT_AMBULATORY_CARE_PROVIDER_SITE_OTHER): Payer: Medicare Other | Admitting: Orthopaedic Surgery

## 2016-07-17 VITALS — BP 181/77 | HR 65 | Temp 96.8°F | Ht 61.0 in | Wt 220.0 lb

## 2016-07-17 DIAGNOSIS — M79672 Pain in left foot: Secondary | ICD-10-CM

## 2016-07-17 DIAGNOSIS — M1711 Unilateral primary osteoarthritis, right knee: Secondary | ICD-10-CM | POA: Diagnosis not present

## 2016-07-17 DIAGNOSIS — M25562 Pain in left knee: Secondary | ICD-10-CM

## 2016-07-17 DIAGNOSIS — M25552 Pain in left hip: Secondary | ICD-10-CM

## 2016-07-17 DIAGNOSIS — M545 Low back pain, unspecified: Secondary | ICD-10-CM

## 2016-07-17 DIAGNOSIS — M25551 Pain in right hip: Secondary | ICD-10-CM

## 2016-07-17 DIAGNOSIS — G8929 Other chronic pain: Secondary | ICD-10-CM

## 2016-07-17 DIAGNOSIS — M25561 Pain in right knee: Secondary | ICD-10-CM | POA: Diagnosis not present

## 2016-07-17 MED ORDER — NAPROXEN 500 MG PO TABS: 500.0000 mg | ORAL_TABLET | Freq: Two times a day (BID) | ORAL | 5 refills | Status: DC

## 2016-07-17 NOTE — Progress Notes (Signed)
Patient [name] Stacie Russell, female DOB:[birth year], 65 y.o. CD:[id number]  Chief Complaint  Patient presents with  . NEW PROBLEM    Bilateral hip and knee pain    HPI  Stacie Russell is a 65 y.o. female who has had pain in the right knee for some time.  She had injection by me in the past that helped.  It is still painful. She has no giving way, no locking.  She has developed pain in the lower back.  She has no paresthesias.  It is midline. She has no bowel or bladder problems. She has been taking diclofenac which does not help much.  She has no trauma.  Her back pain is getting worse.  She has callus over the left third and fourth metatarsal heads.  She has had metatarsal bar added to shoes which help.  She would like this area pared by me today.  HPI  Body mass index is 41.57 kg/m.  ROS  Review of Systems  HENT: Negative for congestion.   Respiratory: Negative for cough and shortness of breath.   Cardiovascular: Negative for chest pain and leg swelling.  Endocrine: Positive for cold intolerance.  Musculoskeletal: Positive for arthralgias, gait problem and joint swelling.  Allergic/Immunologic: Positive for environmental allergies.    Past Medical History:  Diagnosis Date  . Asthma   . Diverticulosis   . Endometrial hyperplasia   . Esophageal stricture   . Foot pain   . Fundic gland polyps of stomach, benign 07/10/2015  . Gastritis   . Hemorrhoid   . Hiatal hernia   . Hip pain   . Hyperlipidemia   . Hypertension   . Knee pain   . Metabolic syndrome   . Oxygen deficiency   . Tendonitis of ankle   . Vitamin D deficiency     Past Surgical History:  Procedure Laterality Date  . CATARACT EXTRACTION  May 2009  . CESAREAN SECTION    . COLONOSCOPY  12/26/07  . ESOPHAGOGASTRODUODENOSCOPY  12/26/07, 05/13/08  . KNEE CARTILAGE SURGERY Right Sept 1999  . UMBILICAL HERNIA REPAIR  05/20/06    Family History  Problem Relation Age of Onset  . Cancer Mother    Liver, stomach, colon  . Colon cancer Mother   . Heart attack Father   . Heart disease Sister   . Breast cancer Sister   . Breast cancer Sister   . Esophageal cancer Neg Hx   . Stomach cancer Neg Hx   . Rectal cancer Neg Hx     Social History Social History  Substance Use Topics  . Smoking status: Never Smoker  . Smokeless tobacco: Never Used  . Alcohol use No    No Known Allergies  Current Outpatient Prescriptions  Medication Sig Dispense Refill  . albuterol (PROVENTIL HFA;VENTOLIN HFA) 108 (90 Base) MCG/ACT inhaler Inhale 2 puffs into the lungs every 6 (six) hours as needed for wheezing. 3 Inhaler 3  . aspirin 81 MG tablet Take 81 mg by mouth daily.    . budesonide-formoterol (SYMBICORT) 80-4.5 MCG/ACT inhaler Inhale 2 puffs into the lungs 2 (two) times daily.    . Cholecalciferol (VITAMIN D3) 3000 UNITS TABS Take 1 capsule by mouth daily.    Marland Kitchen losartan-hydrochlorothiazide (HYZAAR) 100-12.5 MG tablet Take 1 tablet by mouth daily. 30 tablet 0  . naproxen (NAPROSYN) 500 MG tablet Take 1 tablet (500 mg total) by mouth 2 (two) times daily with a meal. 60 tablet 5  . Omega-3 Fatty Acids ([employer] NATURAL  FISH OIL) 1000 MG CAPS Take 2 capsules by mouth daily.    Marland Kitchen oxybutynin (DITROPAN) 5 MG tablet TAKE 1 TABLET BY MOUTH 2-3  TIMES DAILY AS NEEDED 270 tablet 1  . pantoprazole (PROTONIX) 40 MG tablet TAKE ONE TABLET BY MOUTH  ONCE DAILY BEFORE BREAKFAST 90 tablet 1   No current facility-administered medications for this visit.      Physical Exam  Blood pressure (!) 181/77, pulse 65, temperature (!) 96.8 F (36 C), height 5\' 1"  (1.549 m), weight 220 lb (99.8 kg).  Constitutional: overall normal hygiene, normal nutrition, well developed, normal grooming, normal body habitus. Assistive device:none  Musculoskeletal: gait and station Limp right, muscle tone and strength are normal, no tremors or atrophy is present.  .  Neurological: coordination overall normal.  Deep tendon  reflex/nerve stretch intact.  Sensation normal.  Cranial nerves II-XII intact.   Skin:   Normal overall no scars, lesions, ulcers or rashes. No psoriasis.  Psychiatric: Alert and oriented x 3.  Recent memory intact, remote memory unclear.  Normal mood and affect. Well groomed.  Good eye contact.  Cardiovascular: overall no swelling, no varicosities, no edema bilaterally, normal temperatures of the legs and arms, no clubbing, cyanosis and good capillary refill.  Lymphatic: palpation is normal.  The right lower extremity is examined:  Inspection:  Thigh:  Non-tender and no defects  Knee has swelling 1+ effusion.                        Joint tenderness is present                        Patient is tender over the medial joint line  Lower Leg:  Has normal appearance and no tenderness or defects  Ankle:  Non-tender and no defects  Foot:  Non-tender and no defects Range of Motion:  Knee:  Range of motion is: 0-100                        Crepitus is  present  Ankle:  Range of motion is normal. Strength and Tone:  The right lower extremity has normal strength and tone. Stability:  Knee:  The knee is stable.  Ankle:  The ankle is stable.  Spine/Pelvis examination:  Inspection:  Overall, sacoiliac joint benign and hips nontender; without crepitus or defects.   Thoracic spine inspection: Alignment normal without kyphosis present   Lumbar spine inspection:  Alignment  with normal lumbar lordosis, without scoliosis apparent.   Thoracic spine palpation:  without tenderness of spinal processes   Lumbar spine palpation: with tenderness of lumbar area; without tightness of lumbar muscles    Range of Motion:   Lumbar flexion, forward flexion is 40 with pain or tenderness    Lumbar extension is 10 with pain or tenderness   Left lateral bend is Normal  without pain or tenderness   Right lateral bend is Normal without pain or tenderness   Straight leg raising is Normal   Strength & tone:  Normal   Stability overall normal stability   She has callus over the plantar portion of the fourth and third metatarsal heads on the left foot. I pared the callus.  The patient has been educated about the nature of the problem(s) and counseled on treatment options.  The patient appeared to understand what I have discussed and is in agreement with it.  Encounter Diagnoses  Name Primary?  . Hip pain, bilateral   . Primary osteoarthritis of right knee   . Chronic bilateral low back pain without sciatica Yes  . Chronic pain of left knee   . Left foot pain    X-rays were done of the lumbar spine, pelvis and right knee,reported separately.  PLAN Call if any problems.  Precautions discussed.  Continue current medications.   Return to clinic 1 month   Electronically Signed Sanjuana Kava, MD 12/5/201711:48 AM

## 2016-07-17 NOTE — Patient Instructions (Signed)
Back Exercises Introduction If you have pain in your back, do these exercises 2-3 times each day or as told by your doctor. When the pain goes away, do the exercises once each day, but repeat the steps more times for each exercise (do more repetitions). If you do not have pain in your back, do these exercises once each day or as told by your doctor. Exercises Single Knee to Chest  Do these steps 3-5 times in a row for each leg: 1. Lie on your back on a firm bed or the floor with your legs stretched out. 2. Bring one knee to your chest. 3. Hold your knee to your chest by grabbing your knee or thigh. 4. Pull on your knee until you feel a gentle stretch in your lower back. 5. Keep doing the stretch for 10-30 seconds. 6. Slowly let go of your leg and straighten it. Pelvic Tilt  Do these steps 5-10 times in a row: 1. Lie on your back on a firm bed or the floor with your legs stretched out. 2. Bend your knees so they point up to the ceiling. Your feet should be flat on the floor. 3. Tighten your lower belly (abdomen) muscles to press your lower back against the floor. This will make your tailbone point up to the ceiling instead of pointing down to your feet or the floor. 4. Stay in this position for 5-10 seconds while you gently tighten your muscles and breathe evenly. Cat-Cow  Do these steps until your lower back bends more easily: 1. Get on your hands and knees on a firm surface. Keep your hands under your shoulders, and keep your knees under your hips. You may put padding under your knees. 2. Let your head hang down, and make your tailbone point down to the floor so your lower back is round like the back of a cat. 3. Stay in this position for 5 seconds. 4. Slowly lift your head and make your tailbone point up to the ceiling so your back hangs low (sags) like the back of a cow. 5. Stay in this position for 5 seconds. Press-Ups  Do these steps 5-10 times in a row: 1. Lie on your belly  (face-down) on the floor. 2. Place your hands near your head, about shoulder-width apart. 3. While you keep your back relaxed and keep your hips on the floor, slowly straighten your arms to raise the top half of your body and lift your shoulders. Do not use your back muscles. To make yourself more comfortable, you may change where you place your hands. 4. Stay in this position for 5 seconds. 5. Slowly return to lying flat on the floor. Bridges  Do these steps 10 times in a row: 1. Lie on your back on a firm surface. 2. Bend your knees so they point up to the ceiling. Your feet should be flat on the floor. 3. Tighten your butt muscles and lift your butt off of the floor until your waist is almost as high as your knees. If you do not feel the muscles working in your butt and the back of your thighs, slide your feet 1-2 inches farther away from your butt. 4. Stay in this position for 3-5 seconds. 5. Slowly lower your butt to the floor, and let your butt muscles relax. If this exercise is too easy, try doing it with your arms crossed over your chest. Belly Crunches  Do these steps 5-10 times in a row: 1. Lie   on your back on a firm bed or the floor with your legs stretched out. 2. Bend your knees so they point up to the ceiling. Your feet should be flat on the floor. 3. Cross your arms over your chest. 4. Tip your chin a little bit toward your chest but do not bend your neck. 5. Tighten your belly muscles and slowly raise your chest just enough to lift your shoulder blades a tiny bit off of the floor. 6. Slowly lower your chest and your head to the floor. Back Lifts  Do these steps 5-10 times in a row: 1. Lie on your belly (face-down) with your arms at your sides, and rest your forehead on the floor. 2. Tighten the muscles in your legs and your butt. 3. Slowly lift your chest off of the floor while you keep your hips on the floor. Keep the back of your head in line with the curve in your back.  Look at the floor while you do this. 4. Stay in this position for 3-5 seconds. 5. Slowly lower your chest and your face to the floor. Contact a doctor if:  Your back pain gets a lot worse when you do an exercise.  Your back pain does not lessen 2 hours after you exercise. If you have any of these problems, stop doing the exercises. Do not do them again unless your doctor says it is okay. Get help right away if:  You have sudden, very bad back pain. If this happens, stop doing the exercises. Do not do them again unless your doctor says it is okay. This information is not intended to replace advice given to you by your health care provider. Make sure you discuss any questions you have with your health care provider. Document Released: 09/01/2010 Document Revised: 01/05/2016 Document Reviewed: 09/23/2014  2017 [employer]  

## 2016-07-18 ENCOUNTER — Ambulatory Visit: Payer: Self-pay | Admitting: Orthopaedic Surgery

## 2016-08-15 ENCOUNTER — Ambulatory Visit (INDEPENDENT_AMBULATORY_CARE_PROVIDER_SITE_OTHER): Payer: Medicare Other | Admitting: Orthopaedic Surgery

## 2016-08-15 VITALS — BP 141/77 | HR 65 | Temp 97.2°F | Ht 62.0 in | Wt 221.0 lb

## 2016-08-15 DIAGNOSIS — G8929 Other chronic pain: Secondary | ICD-10-CM

## 2016-08-15 DIAGNOSIS — M5441 Lumbago with sciatica, right side: Secondary | ICD-10-CM

## 2016-08-15 DIAGNOSIS — M25561 Pain in right knee: Secondary | ICD-10-CM | POA: Diagnosis not present

## 2016-08-15 DIAGNOSIS — M1711 Unilateral primary osteoarthritis, right knee: Secondary | ICD-10-CM | POA: Diagnosis not present

## 2016-08-15 NOTE — Patient Instructions (Addendum)
Discussed Synvisc.  Referred to Dr. Maureen Ralphs for Right knee TKA consult.

## 2016-08-15 NOTE — Progress Notes (Signed)
Patient [name] Stacie Russell, female DOB:Dec 22, 1950, 66 y.o. CD:[id number]  Chief Complaint  Patient presents with  . Follow-up    Chronic low back pain without sciatica and right knee pain    HPI  Stacie Russell is a 66 y.o. female who has lower back pain with right sided sciatica that is getting worse.  She has pain most of the time now.  She has no new trauma.  She has no weakness. She has been on Naprosyn which does not help much.  I will get a MRI of the lumbar spine.  She also has marked DJD of the right knee and bone on bone medially.  She has had Naprosyn, rest, heat, ice and injections.  She would like to have a total knee now.  She says the pain is continuous.  She has no trauma.  Her husband had surgery last summer with Dr. Maureen Ralphs for his right total knee revision.  She would like to have Dr. Maureen Ralphs do her surgery and has asked for me to refer her to him. I will do so. HPI  Body mass index is 40.42 kg/m.  ROS  Review of Systems  HENT: Negative for congestion.   Respiratory: Negative for cough and shortness of breath.   Cardiovascular: Negative for chest pain and leg swelling.  Endocrine: Positive for cold intolerance.  Musculoskeletal: Positive for arthralgias, gait problem and joint swelling.  Allergic/Immunologic: Positive for environmental allergies.    Past Medical History:  Diagnosis Date  . Asthma   . Diverticulosis   . Endometrial hyperplasia   . Esophageal stricture   . Foot pain   . Fundic gland polyps of stomach, benign 07/10/2015  . Gastritis   . Hemorrhoid   . Hiatal hernia   . Hip pain   . Hyperlipidemia   . Hypertension   . Knee pain   . Metabolic syndrome   . Oxygen deficiency   . Tendonitis of ankle   . Vitamin D deficiency     Past Surgical History:  Procedure Laterality Date  . CATARACT EXTRACTION  May 2009  . CESAREAN SECTION    . COLONOSCOPY  12/26/07  . ESOPHAGOGASTRODUODENOSCOPY  12/26/07, 05/13/08  . KNEE CARTILAGE SURGERY  Right Sept 1999  . UMBILICAL HERNIA REPAIR  05/20/06    Family History  Problem Relation Age of Onset  . Cancer Mother     Liver, stomach, colon  . Colon cancer Mother   . Heart attack Father   . Heart disease Sister   . Breast cancer Sister   . Breast cancer Sister   . Esophageal cancer Neg Hx   . Stomach cancer Neg Hx   . Rectal cancer Neg Hx     Social History Social History  Substance Use Topics  . Smoking status: Never Smoker  . Smokeless tobacco: Never Used  . Alcohol use No    No Known Allergies  Current Outpatient Prescriptions  Medication Sig Dispense Refill  . albuterol (PROVENTIL HFA;VENTOLIN HFA) 108 (90 Base) MCG/ACT inhaler Inhale 2 puffs into the lungs every 6 (six) hours as needed for wheezing. 3 Inhaler 3  . aspirin 81 MG tablet Take 81 mg by mouth daily.    . budesonide-formoterol (SYMBICORT) 80-4.5 MCG/ACT inhaler Inhale 2 puffs into the lungs 2 (two) times daily.    . Cholecalciferol (VITAMIN D3) 3000 UNITS TABS Take 1 capsule by mouth daily.    Marland Kitchen losartan-hydrochlorothiazide (HYZAAR) 100-12.5 MG tablet Take 1 tablet by mouth daily. 30 tablet 0  .  naproxen (NAPROSYN) 500 MG tablet Take 1 tablet (500 mg total) by mouth 2 (two) times daily with a meal. 60 tablet 5  . Omega-3 Fatty Acids ([employer] NATURAL FISH OIL) 1000 MG CAPS Take 2 capsules by mouth daily.    Marland Kitchen oxybutynin (DITROPAN) 5 MG tablet TAKE 1 TABLET BY MOUTH 2-3  TIMES DAILY AS NEEDED 270 tablet 1  . pantoprazole (PROTONIX) 40 MG tablet TAKE ONE TABLET BY MOUTH  ONCE DAILY BEFORE BREAKFAST 90 tablet 1   No current facility-administered medications for this visit.      Physical Exam  Blood pressure (!) 141/77, pulse 65, temperature 97.2 F (36.2 C), height 5\' 2"  (1.575 m), weight 221 lb (100.2 kg).  Constitutional: overall normal hygiene, normal nutrition, well developed, normal grooming, normal body habitus. Assistive device:none  Musculoskeletal: gait and station Limp right, muscle tone and  strength are normal, no tremors or atrophy is present.  .  Neurological: coordination overall normal.  Deep tendon reflex/nerve stretch intact.  Sensation normal.  Cranial nerves II-XII intact.   Skin:   Normal overall no scars, lesions, ulcers or rashes. No psoriasis.  Psychiatric: Alert and oriented x 3.  Recent memory intact, remote memory unclear.  Normal mood and affect. Well groomed.  Good eye contact.  Cardiovascular: overall no swelling, no varicosities, no edema bilaterally, normal temperatures of the legs and arms, no clubbing, cyanosis and good capillary refill.  Lymphatic: palpation is normal.  Spine/Pelvis examination:  Inspection:  Overall, sacoiliac joint benign and hips nontender; without crepitus or defects.   Thoracic spine inspection: Alignment normal without kyphosis present   Lumbar spine inspection:  Alignment  with normal lumbar lordosis, without scoliosis apparent.   Thoracic spine palpation:  without tenderness of spinal processes   Lumbar spine palpation: with tenderness of lumbar area; without tightness of lumbar muscles    Range of Motion:   Lumbar flexion, forward flexion is 40 without pain or tenderness    Lumbar extension is 10 without pain or tenderness   Left lateral bend is Normal  without pain or tenderness   Right lateral bend is Normal without pain or tenderness   Straight leg raising is Abnormal- at 30 degrees on the right   Strength & tone: Normal   Stability overall normal stability   The right lower extremity is examined:  Inspection:  Thigh:  Non-tender and no defects  Knee has swelling 1+ effusion.                        Joint tenderness is present                        Patient is tender over the medial joint line  Lower Leg:  Has normal appearance and no tenderness or defects  Ankle:  Non-tender and no defects  Foot:  Non-tender and no defects Range of Motion:  Knee:  Range of motion is: 0-100 with pain                         Crepitus is  present  Ankle:  Range of motion is normal. Strength and Tone:  The right lower extremity has normal strength and tone. Stability:  Knee:  The knee is stable.  Ankle:  The ankle is stable.    The patient has been educated about the nature of the problem(s) and counseled on treatment options.  The patient appeared  to understand what I have discussed and is in agreement with it.  Encounter Diagnoses  Name Primary?  . Chronic pain of right knee Yes  . Chronic right-sided low back pain with right-sided sciatica   . Primary osteoarthritis of right knee     PLAN Call if any problems.  Precautions discussed.  Continue current medications.   Return to clinic for MRI of the lumbar spine, refer to Dr. Maureen Ralphs for right total knee evaluation.   Electronically Signed Sanjuana Kava, MD 1/3/201810:21 AM

## 2016-08-22 ENCOUNTER — Ambulatory Visit (HOSPITAL_COMMUNITY)
Admission: RE | Admit: 2016-08-22 | Discharge: 2016-08-22 | Disposition: A | Discharge status: Home / Self Care | Payer: Medicare Other | Source: Ambulatory Visit | Attending: Orthopaedic Surgery | Admitting: Orthopaedic Surgery

## 2016-08-22 ENCOUNTER — Other Ambulatory Visit: Payer: Self-pay | Admitting: Family Medicine

## 2016-08-22 DIAGNOSIS — M5127 Other intervertebral disc displacement, lumbosacral region: Secondary | ICD-10-CM | POA: Diagnosis not present

## 2016-08-22 DIAGNOSIS — M48061 Spinal stenosis, lumbar region without neurogenic claudication: Secondary | ICD-10-CM | POA: Insufficient documentation

## 2016-08-22 DIAGNOSIS — M545 Low back pain: Secondary | ICD-10-CM | POA: Diagnosis not present

## 2016-08-22 DIAGNOSIS — M5441 Lumbago with sciatica, right side: Secondary | ICD-10-CM

## 2016-08-22 DIAGNOSIS — G8929 Other chronic pain: Secondary | ICD-10-CM

## 2016-08-22 DIAGNOSIS — M5126 Other intervertebral disc displacement, lumbar region: Secondary | ICD-10-CM | POA: Diagnosis not present

## 2016-08-23 ENCOUNTER — Ambulatory Visit: Payer: Self-pay | Admitting: Orthopaedic Surgery

## 2016-08-30 ENCOUNTER — Ambulatory Visit: Payer: Self-pay | Admitting: Orthopaedic Surgery

## 2016-09-04 ENCOUNTER — Ambulatory Visit (INDEPENDENT_AMBULATORY_CARE_PROVIDER_SITE_OTHER): Payer: Medicare Other | Admitting: Orthopaedic Surgery

## 2016-09-04 VITALS — BP 121/73 | HR 76 | Temp 96.8°F | Ht 62.0 in | Wt 226.0 lb

## 2016-09-04 DIAGNOSIS — M5441 Lumbago with sciatica, right side: Secondary | ICD-10-CM | POA: Diagnosis not present

## 2016-09-04 DIAGNOSIS — G8929 Other chronic pain: Secondary | ICD-10-CM

## 2016-09-04 NOTE — Progress Notes (Signed)
Patient [name] Stacie Russell, female DOB:02-28-1951, 66 y.o. CD:[id number]  Chief Complaint  Patient presents with  . Results    MRI Lumbar    HPI  [name] is a 66 y.o. female who has lower back pain with right sided sciatica.  She had a MRI done on 08-22-16.  It shows: IMPRESSION: Mild spinal stenosis and mild foraminal stenosis bilaterally L2-3  Moderately large disc protrusion in the left foramen and lateral to the left foramen at L3-4. There is compression of the left L3 nerve root. Mild to moderate spinal stenosis  Moderate to severe spinal stenosis L4-5  Central disc protrusion L5-S1 with mild spinal stenosis.  I have explained the findings to her.  Her left L3 is compressed but she has right sided sciatica.    I will have her see [facility] at her request.  She has a pending appointment there for a total knee on the right.  HPI  Body mass index is 41.34 kg/m.  ROS  Review of Systems  HENT: Negative for congestion.   Respiratory: Negative for cough and shortness of breath.   Cardiovascular: Negative for chest pain and leg swelling.  Endocrine: Positive for cold intolerance.  Musculoskeletal: Positive for arthralgias, gait problem and joint swelling.  Allergic/Immunologic: Positive for environmental allergies.    Past Medical History:  Diagnosis Date  . Asthma   . Diverticulosis   . Endometrial hyperplasia   . Esophageal stricture   . Foot pain   . Fundic gland polyps of stomach, benign 07/10/2015  . Gastritis   . Hemorrhoid   . Hiatal hernia   . Hip pain   . Hyperlipidemia   . Hypertension   . Knee pain   . Metabolic syndrome   . Oxygen deficiency   . Tendonitis of ankle   . Vitamin D deficiency     Past Surgical History:  Procedure Laterality Date  . CATARACT EXTRACTION  May 2009  . CESAREAN SECTION    . COLONOSCOPY  12/26/07  . ESOPHAGOGASTRODUODENOSCOPY  12/26/07, 05/13/08  . KNEE CARTILAGE SURGERY Right Sept 1999   . UMBILICAL HERNIA REPAIR  05/20/06    Family History  Problem Relation Age of Onset  . Cancer Mother     Liver, stomach, colon  . Colon cancer Mother   . Heart attack Father   . Heart disease Sister   . Breast cancer Sister   . Breast cancer Sister   . Esophageal cancer Neg Hx   . Stomach cancer Neg Hx   . Rectal cancer Neg Hx     Social History Social History  Substance Use Topics  . Smoking status: Never Smoker  . Smokeless tobacco: Never Used  . Alcohol use No    No Known Allergies  Current Outpatient Prescriptions  Medication Sig Dispense Refill  . albuterol (PROVENTIL HFA;VENTOLIN HFA) 108 (90 Base) MCG/ACT inhaler Inhale 2 puffs into the lungs every 6 (six) hours as needed for wheezing. 3 Inhaler 3  . aspirin 81 MG tablet Take 81 mg by mouth daily.    . budesonide-formoterol (SYMBICORT) 80-4.5 MCG/ACT inhaler Inhale 2 puffs into the lungs 2 (two) times daily.    . Cholecalciferol (VITAMIN D3) 3000 UNITS TABS Take 1 capsule by mouth daily.    Marland Kitchen losartan-hydrochlorothiazide (HYZAAR) 100-12.5 MG tablet Take 1 tablet by mouth daily. 30 tablet 0  . naproxen (NAPROSYN) 500 MG tablet Take 1 tablet (500 mg total) by mouth 2 (two) times daily with a meal. 60 tablet  5  . Omega-3 Fatty Acids ([employer] NATURAL FISH OIL) 1000 MG CAPS Take 2 capsules by mouth daily.    Marland Kitchen oxybutynin (DITROPAN) 5 MG tablet TAKE 1 TABLET BY MOUTH 2-3  TIMES DAILY AS NEEDED 270 tablet 1  . pantoprazole (PROTONIX) 40 MG tablet TAKE ONE TABLET BY MOUTH  ONCE DAILY BEFORE BREAKFAST 90 tablet 1   No current facility-administered medications for this visit.      Physical Exam  Blood pressure 121/73, pulse 76, temperature (!) 96.8 F (36 C), height 5\' 2"  (1.575 m), weight 226 lb (102.5 kg).  Constitutional: overall normal hygiene, normal nutrition, well developed, normal grooming, normal body habitus. Assistive device:none  Musculoskeletal: gait and station Limp right, muscle tone and strength are  normal, no tremors or atrophy is present.  .  Neurological: coordination overall normal.  Deep tendon reflex/nerve stretch intact.  Sensation normal.  Cranial nerves II-XII intact.   Skin:   Normal overall no scars, lesions, ulcers or rashes. No psoriasis.  Psychiatric: Alert and oriented x 3.  Recent memory intact, remote memory unclear.  Normal mood and affect. Well groomed.  Good eye contact.  Cardiovascular: overall no swelling, no varicosities, no edema bilaterally, normal temperatures of the legs and arms, no clubbing, cyanosis and good capillary refill.  Lymphatic: palpation is normal.  Spine/Pelvis examination:  Inspection:  Overall, sacoiliac joint benign and hips nontender; without crepitus or defects.   Thoracic spine inspection: Alignment normal without kyphosis present   Lumbar spine inspection:  Alignment  with normal lumbar lordosis, without scoliosis apparent.   Thoracic spine palpation:  without tenderness of spinal processes   Lumbar spine palpation: with tenderness of lumbar area; without tightness of lumbar muscles    Range of Motion:   Lumbar flexion, forward flexion is 45 without pain or tenderness    Lumbar extension is 10 without pain or tenderness   Left lateral bend is Normal  without pain or tenderness   Right lateral bend is Normal without pain or tenderness   Straight leg raising is Abnormal- 30 degrees right   Strength & tone: Normal   Stability overall normal stability      The patient has been educated about the nature of the problem(s) and counseled on treatment options.  The patient appeared to understand what I have discussed and is in agreement with it.  Encounter Diagnosis  Name Primary?  . Chronic right-sided low back pain with right-sided sciatica Yes    PLAN Call if any problems.  Precautions discussed.  Continue current medications.   Return to clinic to be seen at [facility],  MD 1/23/201811:27 AM

## 2016-09-05 ENCOUNTER — Ambulatory Visit (INDEPENDENT_AMBULATORY_CARE_PROVIDER_SITE_OTHER): Payer: Medicare Other | Admitting: Pediatrics

## 2016-09-05 ENCOUNTER — Encounter: Payer: Self-pay | Admitting: Pediatrics

## 2016-09-05 VITALS — BP 137/75 | HR 70 | Temp 97.2°F | Ht 62.0 in | Wt 227.6 lb

## 2016-09-05 DIAGNOSIS — Z23 Encounter for immunization: Secondary | ICD-10-CM | POA: Diagnosis not present

## 2016-09-05 DIAGNOSIS — N3281 Overactive bladder: Secondary | ICD-10-CM

## 2016-09-05 DIAGNOSIS — Z0000 Encounter for general adult medical examination without abnormal findings: Secondary | ICD-10-CM | POA: Diagnosis not present

## 2016-09-05 DIAGNOSIS — K649 Unspecified hemorrhoids: Secondary | ICD-10-CM | POA: Diagnosis not present

## 2016-09-05 DIAGNOSIS — K5790 Diverticulosis of intestine, part unspecified, without perforation or abscess without bleeding: Secondary | ICD-10-CM | POA: Diagnosis not present

## 2016-09-05 DIAGNOSIS — K2970 Gastritis, unspecified, without bleeding: Secondary | ICD-10-CM | POA: Diagnosis not present

## 2016-09-05 NOTE — Progress Notes (Signed)
  Subjective:   Patient ID: Stacie Russell, female    DOB: 02-22-1951, 66 y.o.   MRN: [id number] CC: well Exam  HPI: Stacie Russell is a 67 y.o. female presenting for well Exam  Incontinence: ongoing for some years Was followed urology in past, tried bladder training, didn't notice any improvement mybetriq helped minimally Stacie Russell she thinks helped the best but was $160 a month, not able to afford Now on oxybutynin, takes twice a day, helps some, not as much as Stacie Russell Wears pads all the time due to incontinence Sometimes has stress incontinence Primarily urge incontinence Sometimes not able to make it to the bathroom in the middle of the night Tries not to drink at night Has done timed voiding in the past  No h/o abnormal pap smears Last 4 years ago, was getting regularly prior to that One sexual partner last few decades  Sisters with h/o breast ca Pt due for mammogram  Enjoys baking, watching grandkids Tries to stay active, goes to gym for weight lifting regularly Hip pain, knee OA prevent walking  Regular stooling, no blood in stools, no constipation  Relevant past medical, surgical, family and social history reviewed. Allergies and medications reviewed and updated. History  Smoking Status  . Never Smoker  Smokeless Tobacco  . Never Used   ROS: All systems negative other than what is in HPI  Objective:    BP 137/75   Pulse 70   Temp 97.2 F (36.2 C) (Oral)   Ht 5\' 2"  (1.575 m)   Wt 227 lb 9.6 oz (103.2 kg)   BMI 41.63 kg/m   Wt Readings from Last 3 Encounters:  09/05/16 227 lb 9.6 oz (103.2 kg)  09/04/16 226 lb (102.5 kg)  08/15/16 221 lb (100.2 kg)    Gen: NAD, alert, cooperative with exam, NCAT EYES: EOMI, no conjunctival injection, or no icterus ENT:  TMs pearly gray b/l, OP without erythema LYMPH: no cervical, axillary LAD CV: NRRR, normal S1/S2, no murmur, distal pulses 2+ b/l Resp: CTABL, no wheezes, normal WOB Abd: +BS, soft, NTND. no  guarding or organomegaly Ext: No edema, warm Neuro: Alert and oriented, strength equal b/l UE and LE, coordination grossly normal Breast: no discrete nodules felt b/l, does have some soft nodularity throughout breasts b/l, TTP lateral lower quad b/l with palpation  Assessment & Plan:  Stacie Russell was seen today for well exam.  Diagnoses and all orders for this visit:  Routine medical exam -     Pneumococcal polysaccharide vaccine 23-valent greater than or equal to 68yo subcutaneous/IM -     MM DIGITAL SCREENING BILATERAL; Future -     Hepatitis C antibody; Future  Breast tenderness -     MM Digital Diagnostic Bilat; Future  Overactive bladder Increase to TID oxybutinin, other medicines have either been no more effective or too expensive Cont timed voiding, kegel exercises at home  BMI 41 Discussed lifestyle changes, cont to stay active as able, decrease sugar intake  Follow up plan: Return for next scheduled appointment. Assunta Found, MD [place]

## 2016-09-05 NOTE — Patient Instructions (Addendum)
Replens or vagifem as moisturizing

## 2016-09-07 ENCOUNTER — Other Ambulatory Visit: Payer: Self-pay | Admitting: *Deleted

## 2016-09-07 DIAGNOSIS — R195 Other fecal abnormalities: Secondary | ICD-10-CM

## 2016-09-07 LAB — FECAL OCCULT BLOOD, IMMUNOCHEMICAL: Fecal Occult Bld: POSITIVE — AB

## 2016-09-12 ENCOUNTER — Encounter (INDEPENDENT_AMBULATORY_CARE_PROVIDER_SITE_OTHER): Payer: Self-pay | Admitting: Internal Medicine

## 2016-09-20 ENCOUNTER — Encounter: Payer: Self-pay | Admitting: *Deleted

## 2016-09-21 ENCOUNTER — Telehealth (INDEPENDENT_AMBULATORY_CARE_PROVIDER_SITE_OTHER): Payer: Self-pay

## 2016-09-21 ENCOUNTER — Other Ambulatory Visit: Payer: Self-pay | Admitting: *Deleted

## 2016-09-21 DIAGNOSIS — M25561 Pain in right knee: Secondary | ICD-10-CM

## 2016-09-21 NOTE — Telephone Encounter (Signed)
We have faxed documents 4 times to [facility].  I called and talked with Nira Conn and she ask me to fax to her number directly and she would make the appt.  She apologized for the problem.    I tried to fax the records 21 pages and it failed.  I called the office back and spoke to [facility] she said she did not know who Nira Conn was but Freida Busman is the person who is taking care of this.  She said she has the records but she wanted more information to schedule a 2nd op.  I explained this is not for a 2nd op it is for transfer of care since Dr Raliegh Ip does not do surgery and the patient had surgery with Dr Maureen Ralphs in the past. That is why she wants to go back to [place].  Did not really get anywhere with [facility].  I called the patient and explained the issues we were having trying to get her an appointment and she is going to call Antionette Char since is a former patient. She will call us back if she needs anything else form [place].  She was very understanding and appreciated our efforts.

## 2016-09-24 ENCOUNTER — Other Ambulatory Visit: Payer: Medicare Other

## 2016-09-24 DIAGNOSIS — Z1211 Encounter for screening for malignant neoplasm of colon: Secondary | ICD-10-CM

## 2016-09-25 LAB — FECAL OCCULT BLOOD, IMMUNOCHEMICAL: FECAL OCCULT BLD: NEGATIVE

## 2016-09-26 ENCOUNTER — Ambulatory Visit (INDEPENDENT_AMBULATORY_CARE_PROVIDER_SITE_OTHER): Payer: Self-pay | Admitting: Internal Medicine

## 2016-09-27 DIAGNOSIS — M1711 Unilateral primary osteoarthritis, right knee: Secondary | ICD-10-CM | POA: Diagnosis not present

## 2016-10-02 DIAGNOSIS — M48062 Spinal stenosis, lumbar region with neurogenic claudication: Secondary | ICD-10-CM | POA: Diagnosis not present

## 2016-10-09 DIAGNOSIS — H04123 Dry eye syndrome of bilateral lacrimal glands: Secondary | ICD-10-CM | POA: Diagnosis not present

## 2016-10-09 DIAGNOSIS — H26491 Other secondary cataract, right eye: Secondary | ICD-10-CM | POA: Diagnosis not present

## 2016-10-09 DIAGNOSIS — H43813 Vitreous degeneration, bilateral: Secondary | ICD-10-CM | POA: Diagnosis not present

## 2016-10-09 DIAGNOSIS — Z961 Presence of intraocular lens: Secondary | ICD-10-CM | POA: Diagnosis not present

## 2016-10-18 DIAGNOSIS — M48062 Spinal stenosis, lumbar region with neurogenic claudication: Secondary | ICD-10-CM | POA: Diagnosis not present

## 2016-10-18 DIAGNOSIS — H26491 Other secondary cataract, right eye: Secondary | ICD-10-CM | POA: Diagnosis not present

## 2016-10-18 DIAGNOSIS — Z961 Presence of intraocular lens: Secondary | ICD-10-CM | POA: Diagnosis not present

## 2016-10-26 DIAGNOSIS — M48062 Spinal stenosis, lumbar region with neurogenic claudication: Secondary | ICD-10-CM | POA: Diagnosis not present

## 2016-11-01 DIAGNOSIS — M48062 Spinal stenosis, lumbar region with neurogenic claudication: Secondary | ICD-10-CM | POA: Diagnosis not present

## 2016-11-06 ENCOUNTER — Ambulatory Visit (INDEPENDENT_AMBULATORY_CARE_PROVIDER_SITE_OTHER): Payer: Medicare Other | Admitting: Internal Medicine

## 2016-11-06 ENCOUNTER — Encounter: Payer: Self-pay | Admitting: Internal Medicine

## 2016-11-06 VITALS — BP 102/66 | HR 76 | Ht 60.63 in | Wt 226.1 lb

## 2016-11-06 DIAGNOSIS — R195 Other fecal abnormalities: Secondary | ICD-10-CM

## 2016-11-06 DIAGNOSIS — K581 Irritable bowel syndrome with constipation: Secondary | ICD-10-CM | POA: Diagnosis not present

## 2016-11-06 DIAGNOSIS — K601 Chronic anal fissure: Secondary | ICD-10-CM | POA: Diagnosis not present

## 2016-11-06 DIAGNOSIS — K648 Other hemorrhoids: Secondary | ICD-10-CM | POA: Diagnosis not present

## 2016-11-06 MED ORDER — DILTIAZEM GEL 2 %: 1.0000 "application " | Freq: Two times a day (BID) | CUTANEOUS | 3 refills | Status: DC

## 2016-11-06 NOTE — Patient Instructions (Addendum)
   I think the bleeding is coming from an anal fissure and hemorrhoids. Please increase Citrucel to 1 tablespoon a day and as much as 3 tablespoons in a day to help your bowels move.  The diltiazem gel is to treat the anal fissure - please go to [facility] in [facility] to get that.   I appreciate the opportunity to care for you.

## 2016-11-06 NOTE — Progress Notes (Signed)
   Stacie Russell 66 y.o. 05-07-1951 [id number]  Assessment & Plan:   1. Heme + stool   2. Chronic posterior anal fissure   3. Irritable bowel syndrome with constipation   4. Internal hemorrhoids     Increase fiber Anal fissure handout All time as an gel to treat anal fissure twice a day She does not really need interval Hemoccults, a colonoscopy every 5 years is appropriate for her would plan to repeat that in 2021 barring other problems. I suspect the heme positive stool is related to her fissure and hemorrhoids. We discussed possibly checking her CBC, she was not anemic in November, she says she recently donated blood and was told she wasn't anemic so we decided not to pursue that.  RTC 2 mos  I appreciate the opportunity to care for this patient. Dr. Assunta Found  Subjective:   Chief Complaint: Heme-positive stool  HPI The patient is a pleasant 66 year old married white woman here with her husband, she has a history of repeat colonoscopy for family history of colon cancer in her mother last colonoscopy in 2016 without significant findings. She had Hemoccult testing done of the stool and an immune-based test was positive in early 2017. The patient does have intermittent rectal bleeding, and some painful defecation and is constipated some of the time. He does take Citrucel 1 teaspoon daily and thinks that helps but we'll still go for days 2 or 3 without significant defecation and then will have hard stools and straining and then sort of unloads with multiple bowel movements at times it may become softer loose. The patient says she had a fissure surgically treated 30 years ago. Medications, allergies, past medical history, past surgical history, family history and social history are reviewed and updated in the EMR.  Review of Systems As above  Objective:   Physical Exam BP 102/66 (BP Location: Left Arm, Patient Position: Sitting, Cuff Size: Large)   Pulse 76   Ht 5'  0.63" (1.54 m) Comment: height measured without shoes  Wt 226 lb 2 oz (102.6 kg)   BMI 43.25 kg/m  No acute distress  Patti Martinique, CMA present.  Specimen of the anoderm suggest a posterior fissure with a small sentinel pile. Digital exam reveals mild anal stenosis with tenderness and induration in the posterior anal and rectal area. There is brown stool and no mass.  Anoscopy is performed demonstrating small grade 1 internal hemorrhoids and the posterior fissure. There are stigmata of bleeding in the fissure and hemorrhoids.

## 2016-11-14 ENCOUNTER — Other Ambulatory Visit: Payer: Self-pay | Admitting: Family Medicine

## 2016-12-15 ENCOUNTER — Ambulatory Visit (INDEPENDENT_AMBULATORY_CARE_PROVIDER_SITE_OTHER): Payer: Medicare Other | Admitting: Family

## 2016-12-15 ENCOUNTER — Encounter: Payer: Self-pay | Admitting: Family

## 2016-12-15 VITALS — BP 136/73 | HR 81 | Temp 98.0°F | Ht 60.63 in | Wt 231.0 lb

## 2016-12-15 DIAGNOSIS — R609 Edema, unspecified: Secondary | ICD-10-CM | POA: Diagnosis not present

## 2016-12-15 DIAGNOSIS — I493 Ventricular premature depolarization: Secondary | ICD-10-CM

## 2016-12-15 DIAGNOSIS — R5383 Other fatigue: Secondary | ICD-10-CM

## 2016-12-15 DIAGNOSIS — R600 Localized edema: Secondary | ICD-10-CM

## 2016-12-15 NOTE — Patient Instructions (Addendum)
Fatigue Fatigue is feeling tired all of the time, a lack of energy, or a lack of motivation. Occasional or mild fatigue is often a normal response to activity or life in general. However, long-lasting (chronic) or extreme fatigue may indicate an underlying medical condition. Follow these instructions at home: Watch your fatigue for any changes. The following actions may help to lessen any discomfort you are feeling:  Talk to your health care provider about how much sleep you need each night. Try to get the required amount every night.  Take medicines only as directed by your health care provider.  Eat a healthy and nutritious diet. Ask your health care provider if you need help changing your diet.  Drink enough fluid to keep your urine clear or pale yellow.  Practice ways of relaxing, such as yoga, meditation, massage therapy, or acupuncture.  Exercise regularly.  Change situations that cause you stress. Try to keep your work and personal routine reasonable.  Do not abuse illegal drugs.  Limit alcohol intake to no more than 1 drink per day for nonpregnant women and 2 drinks per day for men. One drink equals 12 ounces of beer, 5 ounces of wine, or 1 ounces of hard liquor.  Take a multivitamin, if directed by your health care provider. Contact a health care provider if:  Your fatigue does not get better.  You have a fever.  You have unintentional weight loss or gain.  You have headaches.  You have difficulty:  Falling asleep.  Sleeping throughout the night.  You feel angry, guilty, anxious, or sad.  You are unable to have a bowel movement (constipation).  You skin is dry.  Your legs or another part of your body is swollen. Get help right away if:  You feel confused.  Your vision is blurry.  You feel faint or pass out.  You have a severe headache.  You have severe abdominal, pelvic, or back pain.  You have chest pain, shortness of breath, or an irregular or  fast heartbeat.  You are unable to urinate or you urinate less than normal.  You develop abnormal bleeding, such as bleeding from the rectum, vagina, nose, lungs, or nipples.  You vomit blood.  You have thoughts about harming yourself or committing suicide.  You are worried that you might harm someone else. This information is not intended to replace advice given to you by your health care provider. Make sure you discuss any questions you have with your health care provider. Document Released: 05/27/2007 Document Revised: 01/05/2016 Document Reviewed: 12/01/2013 Elsevier Interactive Patient Education  2017 Else[employer] Inc.  Premature Ventricular Contraction A premature ventricular contraction (PVC) is a common irregularity in the normal heart rhythm. These contractions are extra heartbeats that start in the heart ventricles and occur too early in the normal sequence. During the PVC, the heart's normal electrical pathway is not used, so the beat is shorter and less effective. In most cases, these contractions come and go and do not require treatment. What are the causes? In many cases, the cause may not be known. Common causes of the condition include:  Smoking.  Drinking alcohol.  Caffeine.  Certain medicines.  Some illegal drugs.  Stress. Certain medical conditions can also cause PVCs:  Changes in minerals in the blood (electrolytes).  Heart failure.  Heart valve problems.  Low blood oxygen levels or high carbon dioxide levels.  Heart attack, or coronary artery disease. What are the signs or symptoms? The main symptom of this  condition is a fast or skipped heartbeat (palpitations). Other symptoms include:  Chest pain.  Shortness of breath.  Feeling tired.  Dizziness. In some cases, there are no symptoms. How is this diagnosed? This condition may be diagnosed based on:  Your medical history.  A physical exam. During the exam, the health care provider will check  for irregular heartbeats.  Tests, such as:  An ECG (electrocardiogram) to monitor the electrical activity of your heart.  Holter monitor testing. This involves wearing a device that clips to your clothing and monitors the electrical activity of your heart over longer periods of time.  Stress tests to see how exercise affects your heart rhythm and blood supply.  Echocardiogram. This test uses sound waves (ultrasound) to produce an image of your heart.  Electrophysiology study. This test checks the electric pathways in your heart. How is this treated? Treatment depends on any underlying conditions, the type of PVCs that you are having, and how much the symptoms are interfering with your daily life. Possible treatments include:  Avoiding things that can trigger the premature contractions, such as caffeine or alcohol.  Medicines. These may be given if symptoms are severe or if the extra heartbeats are frequent.  Treatment for any underlying condition that is found to be the cause of the contractions.  Catheter ablation. This procedure destroys the heart tissues that send abnormal signals. In some cases, no treatment is required. Follow these instructions at home: Lifestyle  Follow these instructions as told by your health care provider:  Do not use any products that contain nicotine or tobacco, such as cigarettes and e-cigarettes. If you need help quitting, ask your health care provider.  If caffeine triggers episodes of PVC, do not eat, drink, or use anything with caffeine in it.  If caffeine does not seem to trigger episodes, consume caffeine in moderation.  If alcohol triggers episodes of PVC, do not drink alcohol.  If alcohol does not seem to trigger episodes, limit alcohol intake to no more than 1 drink a day for nonpregnant women and 2 drinks a day for men. One drink equals 12 oz of beer, 5 oz of wine, or 1 oz of hard liquor.  Exercise regularly. Ask your health care  provider what type of exercise is safe for you.  Find healthy ways to manage stress. Avoid stressful situations when possible.  Try to get at least 7-9 hours of sleep each night, or as much as recommended by your health care provider.  Do not use illegal drugs. General instructions   Take over-the-counter and prescription medicines only as told by your health care provider.  Keep all follow-up visits as told by your health care provider. This is important. Get help right away if:  You feel palpitations that are frequent or continual.  You have chest pain.  You have shortness of breath.  You have sweating for no reason.  You have nausea and vomiting.  You become light-headed or you faint. This information is not intended to replace advice given to you by your health care provider. Make sure you discuss any questions you have with your health care provider. Document Released: 03/16/2004 Document Revised: 03/23/2016 Document Reviewed: 01/04/2016 Elsevier Interactive Patient Education  2017 [employer].

## 2016-12-15 NOTE — Progress Notes (Signed)
   Subjective:    Patient ID: Stacie Russell, female    DOB: [birth year], 66 y.o.   MRN: [id number]  HPI PT presents to the office today for fatigue and exhaustion that started over the last few day that is gradually worsening. Pt complaining of intermittent SOB and mild edema that improves in the morning after waking up.   Denies any fevers, abdominal pain, hematochezia, or chest pain. PT states she saw GI for a positive FOBT and was told she had a fissure and that is stable.    Review of Systems  Constitutional: Positive for fatigue.  Respiratory: Positive for shortness of breath.   All other systems reviewed and are negative.      Objective:   Physical Exam  Constitutional: She is oriented to person, place, and time. She appears well-developed and well-nourished. No distress.  HENT:  Head: Normocephalic and atraumatic.  Right Ear: External ear normal.  Left Ear: External ear normal.  Nose: Nose normal.  Mouth/Throat: Oropharynx is clear and moist.  Eyes: Pupils are equal, round, and reactive to light.  Neck: Normal range of motion. Neck supple. No thyromegaly present.  Cardiovascular: Normal rate, regular rhythm, normal heart sounds and intact distal pulses.   No murmur heard. Pulmonary/Chest: Effort normal and breath sounds normal. No respiratory distress. She has no wheezes.  Abdominal: Soft. Bowel sounds are normal. She exhibits no distension. There is no tenderness.  Musculoskeletal: Normal range of motion. She exhibits edema (trace in bilateral ankles). She exhibits no tenderness.  Neurological: She is alert and oriented to person, place, and time.  Skin: Skin is warm and dry.  Psychiatric: She has a normal mood and affect. Her behavior is normal. Judgment and thought content normal.  Vitals reviewed.  EKG-PVC's noted  BP 136/73   Pulse 81   Temp 98 F (36.7 C) (Oral)   Ht 5' 0.63" (1.54 m)   Wt 231 lb (104.8 kg)   SpO2 98%   BMI 44.18 kg/m      Assessment  & Plan:  1. Fatigue, unspecified type - Anemia Profile B - CMP14+EGFR - Thyroid Panel With TSH - EKG 12-Lead  2. Mild peripheral edema -Compression hose Low salt diet Keep elevated when possible - CMP14+EGFR - Brain natriuretic peptide  3. PVC (premature ventricular contraction) Avoid caffeine and stress Encouraged healthy diet and exercise Rest  Viral? Stress?  Labs pending Force fluids Encourage healthy diet and exercise RTO prn and keep chronic follow up with PCP  Evelina Dun, FNP

## 2016-12-16 LAB — ANEMIA PROFILE B
BASOS: 1 %
Basophils Absolute: 0 10*3/uL (ref 0.0–0.2)
EOS (ABSOLUTE): 0.3 10*3/uL (ref 0.0–0.4)
Eos: 4 %
FERRITIN: 8 ng/mL — AB (ref 15–150)
Folate: 18 ng/mL (ref >3.0)
HEMATOCRIT: 35.2 % (ref 34.0–46.6)
Hemoglobin: 11.5 g/dL (ref 11.1–15.9)
Immature Grans (Abs): 0 10*3/uL (ref 0.0–0.1)
Immature Granulocytes: 0 %
Iron Saturation: 16 % (ref 15–55)
Iron: 64 ug/dL (ref 27–139)
Lymphocytes Absolute: 1.7 10*3/uL (ref 0.7–3.1)
Lymphs: 26 %
MCH: 30.2 pg (ref 26.6–33.0)
MCHC: 32.7 g/dL (ref 31.5–35.7)
MCV: 92 fL (ref 79–97)
MONOS ABS: 0.4 10*3/uL (ref 0.1–0.9)
Monocytes: 7 %
NEUTROS ABS: 4.2 10*3/uL (ref 1.4–7.0)
Neutrophils: 62 %
Platelets: 254 10*3/uL (ref 150–379)
RBC: 3.81 x10E6/uL (ref 3.77–5.28)
RDW: 13.1 % (ref 12.3–15.4)
Retic Ct Pct: 1.3 % (ref 0.6–2.6)
Total Iron Binding Capacity: 411 ug/dL (ref 250–450)
UIBC: 347 ug/dL (ref 118–369)
VITAMIN B 12: 373 pg/mL (ref 232–1245)
WBC: 6.6 10*3/uL (ref 3.4–10.8)

## 2016-12-16 LAB — THYROID PANEL WITH TSH
Free Thyroxine Index: 1.3 (ref 1.2–4.9)
T3 Uptake Ratio: 25 % (ref 24–39)
T4, Total: 5.3 ug/dL (ref 4.5–12.0)
TSH: 4.67 u[IU]/mL — AB (ref 0.450–4.500)

## 2016-12-16 LAB — CMP14+EGFR
A/G RATIO: 1.5 (ref 1.2–2.2)
ALT: 16 IU/L (ref 0–32)
AST: 10 IU/L (ref 0–40)
Albumin: 3.7 g/dL (ref 3.6–4.8)
Alkaline Phosphatase: 86 IU/L (ref 39–117)
BILIRUBIN TOTAL: 0.2 mg/dL (ref 0.0–1.2)
BUN/Creatinine Ratio: 23 (ref 12–28)
BUN: 25 mg/dL (ref 8–27)
CO2: 24 mmol/L (ref 18–29)
Calcium: 9 mg/dL (ref 8.7–10.3)
Chloride: 104 mmol/L (ref 96–106)
Creatinine, Ser: 1.1 mg/dL — ABNORMAL HIGH (ref 0.57–1.00)
GFR calc non Af Amer: 52 mL/min/{1.73_m2} — ABNORMAL LOW (ref >59)
GFR, EST AFRICAN AMERICAN: 60 mL/min/{1.73_m2} (ref >59)
Globulin, Total: 2.4 g/dL (ref 1.5–4.5)
Glucose: 101 mg/dL — ABNORMAL HIGH (ref 65–99)
Potassium: 4.1 mmol/L (ref 3.5–5.2)
SODIUM: 143 mmol/L (ref 134–144)
TOTAL PROTEIN: 6.1 g/dL (ref 6.0–8.5)

## 2016-12-17 LAB — BRAIN NATRIURETIC PEPTIDE: BNP: 44.2 pg/mL (ref 0.0–100.0)

## 2016-12-22 ENCOUNTER — Other Ambulatory Visit: Payer: Self-pay | Admitting: Family Medicine

## 2017-01-01 ENCOUNTER — Ambulatory Visit: Payer: Medicare Other | Admitting: Family Medicine

## 2017-01-03 ENCOUNTER — Encounter: Payer: Self-pay | Admitting: Family Medicine

## 2017-01-03 ENCOUNTER — Ambulatory Visit (INDEPENDENT_AMBULATORY_CARE_PROVIDER_SITE_OTHER): Payer: Medicare Other

## 2017-01-03 ENCOUNTER — Ambulatory Visit (INDEPENDENT_AMBULATORY_CARE_PROVIDER_SITE_OTHER): Payer: Medicare Other | Admitting: Family Medicine

## 2017-01-03 VITALS — BP 117/65 | HR 72 | Temp 97.4°F | Ht 60.63 in | Wt 229.0 lb

## 2017-01-03 DIAGNOSIS — E8881 Metabolic syndrome: Secondary | ICD-10-CM

## 2017-01-03 DIAGNOSIS — K219 Gastro-esophageal reflux disease without esophagitis: Secondary | ICD-10-CM

## 2017-01-03 DIAGNOSIS — D509 Iron deficiency anemia, unspecified: Secondary | ICD-10-CM | POA: Diagnosis not present

## 2017-01-03 DIAGNOSIS — E559 Vitamin D deficiency, unspecified: Secondary | ICD-10-CM | POA: Diagnosis not present

## 2017-01-03 DIAGNOSIS — R739 Hyperglycemia, unspecified: Secondary | ICD-10-CM

## 2017-01-03 DIAGNOSIS — Z780 Asymptomatic menopausal state: Secondary | ICD-10-CM

## 2017-01-03 DIAGNOSIS — I10 Essential (primary) hypertension: Secondary | ICD-10-CM

## 2017-01-03 DIAGNOSIS — R5383 Other fatigue: Secondary | ICD-10-CM | POA: Diagnosis not present

## 2017-01-03 DIAGNOSIS — F320 Major depressive disorder, single episode, mild: Secondary | ICD-10-CM | POA: Diagnosis not present

## 2017-01-03 DIAGNOSIS — Z13820 Encounter for screening for osteoporosis: Secondary | ICD-10-CM

## 2017-01-03 DIAGNOSIS — E7800 Pure hypercholesterolemia, unspecified: Secondary | ICD-10-CM

## 2017-01-03 LAB — MICROSCOPIC EXAMINATION
BACTERIA UA: NONE SEEN
Epithelial Cells (non renal): >10 /hpf — AB (ref 0–10)
RBC MICROSCOPIC, UA: NONE SEEN /HPF (ref 0–?)
RENAL EPITHEL UA: NONE SEEN /HPF

## 2017-01-03 LAB — URINALYSIS, COMPLETE
Bilirubin, UA: NEGATIVE
Glucose, UA: NEGATIVE
Ketones, UA: NEGATIVE
LEUKOCYTES UA: NEGATIVE
Nitrite, UA: NEGATIVE
Protein, UA: NEGATIVE
RBC, UA: NEGATIVE
Specific Gravity, UA: 1.02 (ref 1.005–1.030)
Urobilinogen, Ur: 0.2 mg/dL (ref 0.2–1.0)
pH, UA: 6.5 (ref 5.0–7.5)

## 2017-01-03 LAB — BAYER DCA HB A1C WAIVED: HB A1C (BAYER DCA - WAIVED): 5.4 % (ref <7.0)

## 2017-01-03 MED ORDER — LEVOTHYROXINE SODIUM 25 MCG PO TABS: 25.0000 ug | ORAL_TABLET | Freq: Every day | ORAL | 6 refills | Status: DC

## 2017-01-03 MED ORDER — PANTOPRAZOLE SODIUM 40 MG PO TBEC: DELAYED_RELEASE_TABLET | ORAL | 3 refills | Status: DC

## 2017-01-03 MED ORDER — INTEGRA PLUS PO CAPS: 1.0000 | ORAL_CAPSULE | Freq: Every day | ORAL | 6 refills | Status: DC

## 2017-01-03 NOTE — Patient Instructions (Addendum)
Medicare Annual Wellness Visit  [facility] and the medical providers at [facility] strive to bring you the best medical care.  In doing so we not only want to address your current medical conditions and concerns but also to detect new conditions early and prevent illness, disease and health-related problems.    Medicare offers a yearly Wellness Visit which allows our clinical staff to assess your need for preventative services including immunizations, lifestyle education, counseling to decrease risk of preventable diseases and screening for fall risk and other medical concerns.    This visit is provided free of charge (no copay) for all Medicare recipients. The clinical pharmacists at [facility] have begun to conduct these Wellness Visits which will also include a thorough review of all your medications.    As you primary medical provider recommend that you make an appointment for your Annual Wellness Visit if you have not done so already this year.  You may set up this appointment before you leave today or you may call back [phone]) and schedule an appointment.  Please make sure when you call that you mention that you are scheduling your Annual Wellness Visit with the clinical pharmacist so that the appointment may be made for the proper length of time.     Continue current medications. Continue good therapeutic lifestyle changes which include good diet and exercise. Fall precautions discussed with patient. If an FOBT was given today- please return it to our front desk. If you are over 55 years old - you may need Prevnar 4 or the adult Pneumonia vaccine.  **Flu shots are available--- please call and schedule a FLU-CLINIC appointment**  After your visit with [place] today you will receive a survey in the mail or online from [employer] regarding your care with [place]. Please take a moment to fill this out. Your feedback is very  important to [place] as you can help [place] better understand your patient needs as well as improve your experience and satisfaction. WE CARE ABOUT YOU!!!  Hold all anti-inflammatory medicines and only take Tylenol arthritis and no more than one twice daily if needed Watch sodium intake Take iron medicine regularly Take low-dose thyroid medicine regularly Return to clinic in 6 weeks and recheck thyroid and CBC and serum iron studies

## 2017-01-03 NOTE — Addendum Note (Signed)
Addended by: Zannie Cove on: 01/03/2017 02:33 PM   Modules accepted: Orders

## 2017-01-03 NOTE — Progress Notes (Signed)
Subjective:    Patient ID: Stacie Russell, female    DOB: 11-Jun-1951, 66 y.o.   MRN: [id number]  HPI Pt here for follow up and management of chronic medical problems which includes hyperlipidemia and hypertension. She is taking medication regularly.The patient is not been feeling well in general for 2-3 months. She does take Naprosyn and also Voltaren. Because of the elevated kidney function tests we will ask her to hold both of the anti-inflammatory medicines and only take extra strength Tylenol or Tylenol arthritis. She denies any chest pain or shortness of breath anymore than usual. She denies any trouble with her stomach other than some nausea and heartburn. There is a family history of colon cancer in her colonoscopies are up-to-date with the last one being done in 2016 and only diverticulosis was found at that time. Her mother did have colon cancer and she is to repeat this in 2021. She has not seen any blood in the stool or had any black tarry bowel movements. She is passing her water without problems. She is up-to-date on her x-rays as far as having had a chest x-ray done in November 2017. The recent lab work did have low ferritin and elevated iron binding capacity. Also a recent TSH was slightly elevated. As far as other routine lab work that she has not had done recently we will get lipid panel and a CBC. She did complain of this jittery and fatigue feeling and also some swelling in her ankles. Also, as of note, the patient just lost her older sister who is 49 years old who died from old age and complications of heart disease.     Patient Active Problem List   Diagnosis Date Noted  . Fundic gland polyps of stomach, benign 07/10/2015  . Family history of colon cancer in mother - dx 25's 07/04/2015  . Overactive bladder 12/29/2014  . Chest pain 10/22/2013  . Metabolic syndrome 78/67/6720  . Severe obesity (BMI >= 40) ([place]) 05/07/2013  . Essential hypertension, benign 11/03/2012  .  Vitamin D deficiency 11/03/2012  . Knee pain   . Endometrial hyperplasia   . Hemorrhoid   . Foot pain   . Esophageal stricture   . Diverticulosis   . Hip pain   . Gastritis   . Tendonitis of ankle   . Hiatal hernia   . Hyperlipidemia   . Asthma   . GERD 10/03/2009   Outpatient Encounter Prescriptions as of 01/03/2017  Medication Sig  . albuterol (PROVENTIL HFA;VENTOLIN HFA) 108 (90 Base) MCG/ACT inhaler Inhale 2 puffs into the lungs every 6 (six) hours as needed for wheezing.  Marland Kitchen aspirin 81 MG tablet Take 81 mg by mouth daily.  . budesonide-formoterol (SYMBICORT) 80-4.5 MCG/ACT inhaler Inhale 2 puffs into the lungs as needed.   . Cholecalciferol (VITAMIN D3) 3000 UNITS TABS Take 1 capsule by mouth daily.  Marland Kitchen diltiazem 2 % GEL Apply 1 application topically 2 (two) times daily.  Marland Kitchen losartan-hydrochlorothiazide (HYZAAR) 100-12.5 MG tablet TAKE 1 TABLET BY MOUTH  DAILY  . naproxen (NAPROSYN) 500 MG tablet Take 500 mg by mouth 2 (two) times daily with a meal.  . Omega-3 Fatty Acids ([employer] NATURAL FISH OIL) 1000 MG CAPS Take 2 capsules by mouth daily.  Marland Kitchen oxybutynin (DITROPAN) 5 MG tablet TAKE 1 TABLET BY MOUTH 2-3  TIMES DAILY AS NEEDED  . pantoprazole (PROTONIX) 40 MG tablet TAKE ONE TABLET BY MOUTH  ONCE DAILY BEFORE BREAKFAST  . [DISCONTINUED] diclofenac (VOLTAREN) 75 MG  EC tablet Take 75 mg by mouth 2 (two) times daily.   No facility-administered encounter medications on file as of 01/03/2017.      Review of Systems  Constitutional: Positive for fatigue.  HENT: Negative.   Eyes: Negative.   Respiratory: Negative.   Cardiovascular: Positive for leg swelling.  Gastrointestinal: Negative.   Endocrine: Negative.   Genitourinary: Negative.   Musculoskeletal: Positive for back pain ("spinal stenosis" - Dr Luna Glasgow and Dr Nelva Bush).  Skin: Negative.   Allergic/Immunologic: Negative.   Neurological: Positive for weakness (jittery).  Hematological: Negative.   Psychiatric/Behavioral:  Negative.        Objective:   Physical Exam  Constitutional: She is oriented to person, place, and time. She appears well-developed and well-nourished. No distress.  Pleasant, somewhat depressed because of losing a sister and just not feeling well.  HENT:  Head: Normocephalic and atraumatic.  Right Ear: External ear normal.  Left Ear: External ear normal.  Nose: Nose normal.  Mouth/Throat: Oropharynx is clear and moist.  Eyes: Conjunctivae and EOM are normal. Pupils are equal, round, and reactive to light. Right eye exhibits no discharge. Left eye exhibits no discharge. No scleral icterus.  Neck: Normal range of motion. Neck supple. No thyromegaly present.  No bruits thyromegaly or anterior cervical adenopathy  Cardiovascular: Normal rate, regular rhythm, normal heart sounds and intact distal pulses.   No murmur heard. The heart is regular at 60/m  Pulmonary/Chest: Effort normal and breath sounds normal. No respiratory distress. She has no wheezes. She has no rales.  Clear anteriorly and posteriorly  Abdominal: Soft. Bowel sounds are normal. She exhibits no mass. There is tenderness. There is no rebound and no guarding.  Abdominal obesity without masses or organ enlargement or bruits. There was slight suprapubic tenderness.  Musculoskeletal: Normal range of motion. She exhibits no edema.  Lymphadenopathy:    She has no cervical adenopathy.  Neurological: She is alert and oriented to person, place, and time. She has normal reflexes. No cranial nerve deficit.  Skin: Skin is warm and dry. No rash noted.  Psychiatric: She has a normal mood and affect. Her behavior is normal. Judgment and thought content normal.  Nursing note and vitals reviewed.  BP 117/65 (BP Location: Left Arm)   Pulse 72   Temp 97.4 F (36.3 C) (Oral)   Ht 5' 0.63" (1.54 m)   Wt 229 lb (103.9 kg)   BMI 43.80 kg/m         Assessment & Plan:  1. Pure hypercholesterolemia -Continue with aggressive  therapeutic lifestyle changes pending results of lab work - NMR, lipoprofile - CBC with Differential/Platelet  2. Metabolic syndrome -Continue all efforts at weight loss through diet and exercise - CBC with Differential/Platelet - Bayer DCA Hb A1c Waived  3. Essential hypertension, benign -Continue current treatment and reduce sodium intake as much as possible -Hold anti-inflammatory medicines - CBC with Differential/Platelet  4. Gastroesophageal reflux disease, esophagitis presence not specified -Continue with proton next and take one daily before breakfast - CBC with Differential/Platelet  5. Vitamin D deficiency -Continue with current treatment pending results of lab work - CBC with Differential/Platelet - VITAMIN D 25 Hydroxy (Vit-D Deficiency, Fractures) - DG [facility] DEXA; Future  6. Postmenopausal - DG [facility] DEXA; Future  7. Screening for osteoporosis - DG [facility] DEXA; Future  8. Other fatigue -Start levothyroxine 25 g daily and start Integra plus because of mild iron deficiency anemia - Urinalysis, Complete - Bayer DCA Hb A1c Waived  9.  Elevated blood sugar - Bayer DCA Hb A1c Waived  10. Iron deficiency anemia, unspecified iron deficiency anemia type -Start Integra plus one daily  11. Current mild episode of major depressive disorder without prior episode [facility]) -The patient just lost a sister that was her older sister.  Meds ordered this encounter  Medications  . naproxen (NAPROSYN) 500 MG tablet    Sig: Take 500 mg by mouth 2 (two) times daily with a meal.  . levothyroxine (SYNTHROID, LEVOTHROID) 25 MCG tablet    Sig: Take 1 tablet (25 mcg total) by mouth daily before breakfast.    Dispense:  30 tablet    Refill:  6  . FeFum-FePoly-FA-B Cmp-C-Biot (INTEGRA PLUS) CAPS    Sig: Take 1 capsule by mouth daily.    Dispense:  30 capsule    Refill:  6   Patient Instructions                       Medicare Annual Wellness Visit  [facility] and the medical  providers at [facility] strive to bring you the best medical care.  In doing so we not only want to address your current medical conditions and concerns but also to detect new conditions early and prevent illness, disease and health-related problems.    Medicare offers a yearly Wellness Visit which allows our clinical staff to assess your need for preventative services including immunizations, lifestyle education, counseling to decrease risk of preventable diseases and screening for fall risk and other medical concerns.    This visit is provided free of charge (no copay) for all Medicare recipients. The clinical pharmacists at [facility] have begun to conduct these Wellness Visits which will also include a thorough review of all your medications.    As you primary medical provider recommend that you make an appointment for your Annual Wellness Visit if you have not done so already this year.  You may set up this appointment before you leave today or you may call back ([phone]) and schedule an appointment.  Please make sure when you call that you mention that you are scheduling your Annual Wellness Visit with the clinical pharmacist so that the appointment may be made for the proper length of time.     Continue current medications. Continue good therapeutic lifestyle changes which include good diet and exercise. Fall precautions discussed with patient. If an FOBT was given today- please return it to our front desk. If you are over 66 years old - you may need Prevnar 71 or the adult Pneumonia vaccine.  **Flu shots are available--- please call and schedule a FLU-CLINIC appointment**  After your visit with [place] today you will receive a survey in the mail or online from [employer] regarding your care with [place]. Please take a moment to fill this out. Your feedback is very important to [place] as you can help [place] better understand your patient needs as well as  improve your experience and satisfaction. WE CARE ABOUT YOU!!!  Hold all anti-inflammatory medicines and only take Tylenol arthritis and no more than one twice daily if needed Watch sodium intake Take iron medicine regularly Take low-dose thyroid medicine regularly Return to clinic in 6 weeks and recheck thyroid and CBC and serum iron studies   Arrie Senate MD

## 2017-01-04 LAB — NMR, LIPOPROFILE
Cholesterol: 183 mg/dL (ref 100–199)
HDL CHOLESTEROL BY NMR: 60 mg/dL (ref >39)
HDL Particle Number: 33.8 umol/L (ref >=30.5)
LDL PARTICLE NUMBER: 1277 nmol/L — AB (ref <1000)
LDL Size: 21.6 nm (ref >20.5)
LDL-C: 109 mg/dL — AB (ref 0–99)
Small LDL Particle Number: 370 nmol/L (ref <=527)
Triglycerides by NMR: 69 mg/dL (ref 0–149)

## 2017-01-04 LAB — CBC WITH DIFFERENTIAL/PLATELET
Basophils Absolute: 0 10*3/uL (ref 0.0–0.2)
Basos: 0 %
EOS (ABSOLUTE): 0.3 10*3/uL (ref 0.0–0.4)
EOS: 5 %
HEMATOCRIT: 37.7 % (ref 34.0–46.6)
HEMOGLOBIN: 12.3 g/dL (ref 11.1–15.9)
IMMATURE GRANULOCYTES: 0 %
Immature Grans (Abs): 0 10*3/uL (ref 0.0–0.1)
LYMPHS ABS: 2 10*3/uL (ref 0.7–3.1)
Lymphs: 29 %
MCH: 29.4 pg (ref 26.6–33.0)
MCHC: 32.6 g/dL (ref 31.5–35.7)
MCV: 90 fL (ref 79–97)
MONOCYTES: 6 %
Monocytes Absolute: 0.4 10*3/uL (ref 0.1–0.9)
NEUTROS PCT: 60 %
Neutrophils Absolute: 4.1 10*3/uL (ref 1.4–7.0)
Platelets: 275 10*3/uL (ref 150–379)
RBC: 4.19 x10E6/uL (ref 3.77–5.28)
RDW: 13.2 % (ref 12.3–15.4)
WBC: 6.9 10*3/uL (ref 3.4–10.8)

## 2017-01-04 LAB — VITAMIN D 25 HYDROXY (VIT D DEFICIENCY, FRACTURES): VIT D 25 HYDROXY: 38 ng/mL (ref 30.0–100.0)

## 2017-01-14 ENCOUNTER — Ambulatory Visit (INDEPENDENT_AMBULATORY_CARE_PROVIDER_SITE_OTHER): Payer: Medicare Other

## 2017-01-14 ENCOUNTER — Ambulatory Visit (INDEPENDENT_AMBULATORY_CARE_PROVIDER_SITE_OTHER): Payer: Medicare Other | Admitting: Family Medicine

## 2017-01-14 ENCOUNTER — Encounter: Payer: Self-pay | Admitting: Family Medicine

## 2017-01-14 VITALS — BP 127/72 | HR 89 | Temp 97.1°F | Resp 16 | Ht 60.63 in | Wt 228.6 lb

## 2017-01-14 DIAGNOSIS — R0609 Other forms of dyspnea: Secondary | ICD-10-CM

## 2017-01-14 DIAGNOSIS — R7981 Abnormal blood-gas level: Secondary | ICD-10-CM

## 2017-01-14 DIAGNOSIS — I498 Other specified cardiac arrhythmias: Secondary | ICD-10-CM | POA: Diagnosis not present

## 2017-01-14 DIAGNOSIS — R531 Weakness: Secondary | ICD-10-CM | POA: Diagnosis not present

## 2017-01-14 DIAGNOSIS — R0789 Other chest pain: Secondary | ICD-10-CM

## 2017-01-14 NOTE — Patient Instructions (Addendum)
We will call you with the results of the chest x-ray and EKG We will arrange for you to have an appointment with Dr. Gwenlyn Found as soon as we can get this appointment He will have available to him the lab work that we have done as well as the chest x-ray and EKG when he sees you When you have the spells, monitor your radial pulse to see if it is irregular

## 2017-01-14 NOTE — Progress Notes (Signed)
Subjective:    Patient ID: Stacie Russell, female    DOB: Aug 21, 1950, 66 y.o.   MRN: [id number]  HPI  Stacie Russell is here today with her husband.  She has had weakness, dyspnea on exertion and bilateral leg edema for 2 weeks. This is continuing to be an ongoing problem with this patient. Recent lab work was reviewed with the patient. The urinalysis was clear. A1c was good at 5.4%. Cholesterol numbers were elevated as they have been in the past with an LDL C at 109.The CBC had a normal white blood cell count and a good hemoglobin at 12.3 with an adequate platelet count. Thyroid tests were normal. Recent creatinine however was slightly elevated at 1.1. The electrolytes were stable. The tests for heart failure was normal. All liver function tests were normal. The patient had a chest x-ray in November 2017 that was normal. On talking to the patient more closely she just does not feel well and she has noticed problems in her chest may be some discomfort some chest tightness at times. She notices this both at nighttime and during the day. She has not had any nausea or vomiting. She has not had any more shortness of breath than usual and does not describe any problems with her stomach or urinary tract.     Patient Active Problem List   Diagnosis Date Noted  . Fundic gland polyps of stomach, benign 07/10/2015  . Family history of colon cancer in mother - dx 82's 07/04/2015  . Overactive bladder 12/29/2014  . Chest pain 10/22/2013  . Metabolic syndrome 56/38/7564  . Severe obesity (BMI >= 40) ([place]) 05/07/2013  . Essential hypertension, benign 11/03/2012  . Vitamin D deficiency 11/03/2012  . Knee pain   . Endometrial hyperplasia   . Hemorrhoid   . Foot pain   . Esophageal stricture   . Diverticulosis   . Hip pain   . Gastritis   . Tendonitis of ankle   . Hiatal hernia   . Hyperlipidemia   . Asthma   . GERD 10/03/2009   Outpatient Encounter Prescriptions as of 01/14/2017  Medication Sig   . albuterol (PROVENTIL HFA;VENTOLIN HFA) 108 (90 Base) MCG/ACT inhaler Inhale 2 puffs into the lungs every 6 (six) hours as needed for wheezing.  Marland Kitchen aspirin 81 MG tablet Take 81 mg by mouth daily.  . budesonide-formoterol (SYMBICORT) 80-4.5 MCG/ACT inhaler Inhale 2 puffs into the lungs as needed.   . Cholecalciferol (VITAMIN D3) 3000 UNITS TABS Take 1 capsule by mouth daily.  Marland Kitchen diltiazem 2 % GEL Apply 1 application topically 2 (two) times daily.  Marland Kitchen FeFum-FePoly-FA-B Cmp-C-Biot (INTEGRA PLUS) CAPS Take 1 capsule by mouth daily.  Marland Kitchen levothyroxine (SYNTHROID, LEVOTHROID) 25 MCG tablet Take 1 tablet (25 mcg total) by mouth daily before breakfast.  . losartan-hydrochlorothiazide (HYZAAR) 100-12.5 MG tablet TAKE 1 TABLET BY MOUTH  DAILY  . naproxen (NAPROSYN) 500 MG tablet Take 500 mg by mouth 2 (two) times daily with a meal.  . Omega-3 Fatty Acids ([employer] NATURAL FISH OIL) 1000 MG CAPS Take 2 capsules by mouth daily.  Marland Kitchen oxybutynin (DITROPAN) 5 MG tablet TAKE 1 TABLET BY MOUTH 2-3  TIMES DAILY AS NEEDED  . pantoprazole (PROTONIX) 40 MG tablet TAKE ONE TABLET BY MOUTH  ONCE DAILY BEFORE BREAKFAST   No facility-administered encounter medications on file as of 01/14/2017.      Review of Systems  Constitutional: Positive for fatigue.  Eyes: Negative.   Respiratory: Positive for shortness of  breath.        On exertion   Cardiovascular: Positive for leg swelling.       Bilateral   Gastrointestinal: Negative.   Endocrine: Negative.   Genitourinary: Negative.   Musculoskeletal: Negative.   Skin: Negative.   Allergic/Immunologic: Negative.   Neurological: Negative.   Hematological: Negative.   Psychiatric/Behavioral: Negative.        Objective:   Physical Exam  Constitutional: She is oriented to person, place, and time. She appears well-developed and well-nourished. She appears distressed.  HENT:  Head: Normocephalic and atraumatic.  Eyes: Conjunctivae and EOM are normal. Pupils are equal,  round, and reactive to light. Right eye exhibits no discharge. Left eye exhibits no discharge. No scleral icterus.  Neck: Normal range of motion. Neck supple. No thyromegaly present.  No bruits or thyromegaly  Cardiovascular: Normal rate, regular rhythm and normal heart sounds.   No murmur heard. The heart is regular at 72/m  Pulmonary/Chest: Effort normal and breath sounds normal. No respiratory distress. She has no wheezes. She has no rales.  The chest is clear anteriorly and posteriorly  Abdominal: Soft. Bowel sounds are normal. She exhibits no mass. There is no tenderness. There is no rebound and no guarding.  Musculoskeletal: Normal range of motion. She exhibits no edema.  Lymphadenopathy:    She has no cervical adenopathy.  Neurological: She is alert and oriented to person, place, and time.  Skin: Skin is warm and dry. No rash noted.  Psychiatric: She has a normal mood and affect. Her behavior is normal. Judgment and thought content normal.  Nursing note and vitals reviewed.  BP 127/72   Pulse 89   Temp 97.1 F (36.2 C) (Oral)   Resp 16   Ht 5' 0.63" (1.54 m)   Wt 228 lb 9.6 oz (103.7 kg)   SpO2 94%   BMI 43.72 kg/m   EKG with results pending      Assessment & Plan:  1. Weakness -The patient's recent lab work was reviewed with her and no reasons for weakness were determined from this. The only abnormality noted over the past month was a slightly increased creatinine which had not been increased before. - DG Chest 2 View; Future - EKG 12-Lead - Ambulatory referral to Cardiology  2. Dyspnea on exertion -She had a chest x-ray in November which was normal. The lungs are clear today. - DG Chest 2 View; Future - EKG 12-Lead - Ambulatory referral to Cardiology  3. Low oxygen saturation -The O2 today was 94%. - DG Chest 2 View; Future - EKG 12-Lead - Ambulatory referral to Cardiology  4. Chest discomfort -Because of these ongoing problems and complaints we will  schedule her to see the cardiologist who may consider further evaluation with echocardiogram stress test and possibly other studies. - Ambulatory referral to Cardiology  5. Fluttering heart -- Ambulatory referral to Cardiology  Patient Instructions  We will call you with the results of the chest x-ray and EKG We will arrange for you to have an appointment with Dr. Gwenlyn Found as soon as we can get this appointment He will have available to him the lab work that we have done as well as the chest x-ray and EKG when he sees you When you have the spells, monitor your radial pulse to see if it is irregular  Arrie Senate MD

## 2017-01-16 ENCOUNTER — Ambulatory Visit (INDEPENDENT_AMBULATORY_CARE_PROVIDER_SITE_OTHER): Payer: Medicare Other | Admitting: Cardiovascular Disease

## 2017-01-16 ENCOUNTER — Encounter: Payer: Self-pay | Admitting: Cardiovascular Disease

## 2017-01-16 VITALS — BP 162/87 | HR 67 | Ht 60.6 in | Wt 228.0 lb

## 2017-01-16 DIAGNOSIS — R0609 Other forms of dyspnea: Secondary | ICD-10-CM

## 2017-01-16 DIAGNOSIS — I10 Essential (primary) hypertension: Secondary | ICD-10-CM | POA: Diagnosis not present

## 2017-01-16 DIAGNOSIS — R002 Palpitations: Secondary | ICD-10-CM | POA: Diagnosis not present

## 2017-01-16 NOTE — Progress Notes (Signed)
01/16/2017 Stacie Russell   07/27/1951  [id number]  Primary Physician Stacie Herb, MD Primary Cardiologist: Stacie Harp MD Stacie Russell  HPI:  Stacie Russell is a very pleasant 66 year old married Caucasian female mother of 2, grandmother [place] grandchildren accompanied by her husband Stacie Russell today. Her sister was Stacie Russell, patient Stacie Russell, who recently passed away. She was referred to me by Dr. Laurance Russell for cardiovascular evaluation because of excessive fatigue and palpitations. Her only risk factor for heart disease is treated hypertension and family history. She's never had a heart attack or stroke. She denies chest pain or shortness of breath. Her major complaint is of severe fatigue worse over the last several months but present over the last 5-6 years. She does have GERD.   Current Outpatient Prescriptions  Medication Sig Dispense Refill  . albuterol (PROVENTIL HFA;VENTOLIN HFA) 108 (90 Base) MCG/ACT inhaler Inhale 2 puffs into the lungs every 6 (six) hours as needed for wheezing. 3 Inhaler 3  . aspirin 81 MG tablet Take 81 mg by mouth daily.    . budesonide-formoterol (SYMBICORT) 80-4.5 MCG/ACT inhaler Inhale 2 puffs into the lungs as needed.     . Cholecalciferol (VITAMIN D3) 3000 UNITS TABS Take 1 capsule by mouth daily.    Marland Kitchen diltiazem 2 % GEL Apply 1 application topically 2 (two) times daily. 30 g 3  . FeFum-FePoly-FA-B Cmp-C-Biot (INTEGRA PLUS) CAPS Take 1 capsule by mouth daily. 30 capsule 6  . levothyroxine (SYNTHROID, LEVOTHROID) 25 MCG tablet Take 1 tablet (25 mcg total) by mouth daily before breakfast. 30 tablet 6  . losartan-hydrochlorothiazide (HYZAAR) 100-12.5 MG tablet TAKE 1 TABLET BY MOUTH  DAILY 90 tablet 1  . Omega-3 Fatty Acids ([employer] NATURAL FISH OIL) 1000 MG CAPS Take 2 capsules by mouth daily.    . pantoprazole (PROTONIX) 40 MG tablet TAKE ONE TABLET BY MOUTH  ONCE DAILY BEFORE BREAKFAST 90 tablet 3   No current facility-administered  medications for this visit.     No Known Allergies  Social History   Social History  . Marital status: Married    Spouse name: Stacie Russell  . Number of children: N/A  . Years of education: N/A   Occupational History  . Retired    Social History Main Topics  . Smoking status: Never Smoker  . Smokeless tobacco: Never Used  . Alcohol use No  . Drug use: No  . Sexual activity: Not on file   Other Topics Concern  . Not on file   Social History Narrative  . No narrative on file     Review of Systems: General: negative for chills, fever, night sweats or weight changes.  Cardiovascular: negative for chest pain, dyspnea on exertion, edema, orthopnea, palpitations, paroxysmal nocturnal dyspnea or shortness of breath Dermatological: negative for rash Respiratory: negative for cough or wheezing Urologic: negative for hematuria Abdominal: negative for nausea, vomiting, diarrhea, bright red blood per rectum, melena, or hematemesis Neurologic: negative for visual changes, syncope, or dizziness All other systems reviewed and are otherwise negative except as noted above.    Blood pressure (!) 162/87, pulse 67, height 5' 0.6" (1.539 m), weight 228 lb (103.4 kg).  General appearance: alert and no distress Neck: no adenopathy, no carotid bruit, no JVD, supple, symmetrical, trachea midline and thyroid not enlarged, symmetric, no tenderness/mass/nodules Lungs: clear to auscultation bilaterally Heart: regular rate and rhythm, S1, S2 normal, no murmur, click, rub or gallop Extremities: extremities normal, atraumatic, no cyanosis or edema  EKG not performed today  ASSESSMENT AND PLAN:   Essential hypertension, benign History of essential hypertension blood pressure measured 10 162/87 although just 2 days ago in her PCPs office it was 127/72 she is on losartan/hydrochlorothiazide. Continue current meds at current dosing  Palpitations History of palpitations or last several months. Her PCP  check routine blood work. We will obtain a 2 week event monitor to further evaluate      Stacie Harp MD [facility], [facility] 01/16/2017 2:52 PM

## 2017-01-16 NOTE — Patient Instructions (Signed)
Medication Instructions: Your physician recommends that you continue on your current medications as directed. Please refer to the Current Medication list given to you today.   Testing/Procedures: Your physician has requested that you have an echocardiogram. Echocardiography is a painless test that uses sound waves to create images of your heart. It provides your doctor with information about the size and shape of your heart and how well your heart's chambers and valves are working. This procedure takes approximately one hour. There are no restrictions for this procedure.  Your physician has recommended that you wear a 14 day event monitor. Event monitors are medical devices that record the heart's electrical activity. Doctors most often [place] these monitors to diagnose arrhythmias. Arrhythmias are problems with the speed or rhythm of the heartbeat. The monitor is a small, portable device. You can wear one while you do your normal daily activities. This is usually used to diagnose what is causing palpitations/syncope (passing out).  Your physician has requested that you have a lexiscan myoview. For further information please visit [email or web address]. Please follow instruction sheet, as given.  Follow-Up: Your physician recommends that you schedule a follow-up appointment with Dr. Gwenlyn Found after testing is complete.  If you need a refill on your cardiac medications before your next appointment, please call your pharmacy.

## 2017-01-16 NOTE — Assessment & Plan Note (Signed)
History of palpitations or last several months. Her PCP check routine blood work. We will obtain a 2 week event monitor to further evaluate

## 2017-01-16 NOTE — Assessment & Plan Note (Signed)
History of essential hypertension blood pressure measured 10 162/87 although just 2 days ago in her PCPs office it was 127/72 she is on losartan/hydrochlorothiazide. Continue current meds at current dosing

## 2017-01-17 ENCOUNTER — Telehealth (HOSPITAL_COMMUNITY): Payer: Self-pay

## 2017-01-17 DIAGNOSIS — M1711 Unilateral primary osteoarthritis, right knee: Secondary | ICD-10-CM | POA: Diagnosis not present

## 2017-01-17 NOTE — Telephone Encounter (Signed)
Encounter complete. 

## 2017-01-18 ENCOUNTER — Ambulatory Visit: Payer: Medicare Other | Admitting: Cardiovascular Disease

## 2017-01-22 ENCOUNTER — Ambulatory Visit (HOSPITAL_COMMUNITY)
Admission: RE | Admit: 2017-01-22 | Discharge: 2017-01-22 | Disposition: A | Discharge status: Home / Self Care | Payer: Medicare Other | Source: Ambulatory Visit | Attending: Cardiovascular Disease | Admitting: Cardiovascular Disease

## 2017-01-22 DIAGNOSIS — R0609 Other forms of dyspnea: Secondary | ICD-10-CM | POA: Insufficient documentation

## 2017-01-22 MED ORDER — TECHNETIUM TC 99M TETROFOSMIN IV KIT
29.1000 | PACK | Freq: Once | INTRAVENOUS | Status: DC | PRN
  Administered 2017-01-22: 29.1 via INTRAVENOUS
  Filled 2017-01-22: qty 30

## 2017-01-22 MED ORDER — REGADENOSON 0.4 MG/5ML IV SOLN
0.4000 mg | Freq: Once | INTRAVENOUS | Status: DC
  Administered 2017-01-22: 0.4 mg via INTRAVENOUS

## 2017-01-23 ENCOUNTER — Ambulatory Visit (HOSPITAL_COMMUNITY)
Admission: RE | Admit: 2017-01-23 | Discharge: 2017-01-23 | Disposition: A | Discharge status: Home / Self Care | Payer: Medicare Other | Source: Ambulatory Visit | Attending: Cardiovascular Disease | Admitting: Cardiovascular Disease

## 2017-01-23 LAB — MYOCARDIAL PERFUSION IMAGING
CHL CUP NUCLEAR SDS: 1
CHL CUP NUCLEAR SRS: 0
CHL CUP NUCLEAR SSS: 1
LV dias vol: 80 mL (ref 46–106)
LV sys vol: 29 mL
Peak HR: 112 {beats}/min
Rest HR: 59 {beats}/min
TID: 0.93

## 2017-01-23 MED ORDER — TECHNETIUM TC 99M TETROFOSMIN IV KIT
31.2000 | PACK | Freq: Once | INTRAVENOUS | Status: AC | PRN
  Administered 2017-01-23: 31.2 via INTRAVENOUS

## 2017-01-30 ENCOUNTER — Other Ambulatory Visit: Payer: Self-pay

## 2017-01-30 ENCOUNTER — Ambulatory Visit (INDEPENDENT_AMBULATORY_CARE_PROVIDER_SITE_OTHER): Payer: Medicare Other

## 2017-01-30 ENCOUNTER — Ambulatory Visit (HOSPITAL_COMMUNITY): Payer: Medicare Other | Attending: Internal Medicine

## 2017-01-30 ENCOUNTER — Ambulatory Visit: Payer: Self-pay | Admitting: Cardiology

## 2017-01-30 DIAGNOSIS — R0609 Other forms of dyspnea: Secondary | ICD-10-CM | POA: Diagnosis not present

## 2017-01-30 DIAGNOSIS — I5189 Other ill-defined heart diseases: Secondary | ICD-10-CM | POA: Insufficient documentation

## 2017-01-30 DIAGNOSIS — R002 Palpitations: Secondary | ICD-10-CM

## 2017-01-30 MED ORDER — PERFLUTREN LIPID MICROSPHERE
1.0000 mL | INTRAVENOUS | Status: AC | PRN
  Administered 2017-01-30: 2 mL via INTRAVENOUS

## 2017-02-01 ENCOUNTER — Other Ambulatory Visit: Payer: Self-pay | Admitting: Orthopaedic Surgery

## 2017-02-08 ENCOUNTER — Encounter: Payer: Self-pay | Admitting: Family Medicine

## 2017-02-08 ENCOUNTER — Ambulatory Visit (INDEPENDENT_AMBULATORY_CARE_PROVIDER_SITE_OTHER): Payer: Medicare Other | Admitting: Family Medicine

## 2017-02-08 VITALS — BP 152/92 | HR 73 | Temp 97.2°F | Ht 60.0 in | Wt 230.0 lb

## 2017-02-08 DIAGNOSIS — R5383 Other fatigue: Secondary | ICD-10-CM

## 2017-02-08 DIAGNOSIS — R790 Abnormal level of blood mineral: Secondary | ICD-10-CM

## 2017-02-08 DIAGNOSIS — M4800 Spinal stenosis, site unspecified: Secondary | ICD-10-CM

## 2017-02-08 DIAGNOSIS — M25473 Effusion, unspecified ankle: Secondary | ICD-10-CM

## 2017-02-08 DIAGNOSIS — E039 Hypothyroidism, unspecified: Secondary | ICD-10-CM

## 2017-02-08 DIAGNOSIS — R531 Weakness: Secondary | ICD-10-CM | POA: Diagnosis not present

## 2017-02-08 MED ORDER — DOXYCYCLINE HYCLATE 100 MG PO TABS: 100.0000 mg | ORAL_TABLET | Freq: Two times a day (BID) | ORAL | 0 refills | Status: DC

## 2017-02-08 NOTE — Addendum Note (Signed)
Addended by: Zannie Cove on: 02/08/2017 10:13 AM   Modules accepted: Orders

## 2017-02-08 NOTE — Addendum Note (Signed)
Addended by: Zannie Cove on: 02/08/2017 10:21 AM   Modules accepted: Orders

## 2017-02-08 NOTE — Patient Instructions (Signed)
Follow-up with cardiology as planned Arrange an appointment with neurosurgery because of spinal stenosis and weakness in legs for further evaluation Hold prescription for doxycycline pending results of lab work Return to the office in 2-4 weeks for further evaluation

## 2017-02-08 NOTE — Progress Notes (Signed)
Subjective:    Patient ID: Stacie Russell, female    DOB: Feb 11, 1951, 66 y.o.   MRN: [id number]  HPI Patient here today for 6 week follow up from cardio visit, thyroid,low iron and fatigue.The patient has seen the cardiologist and has had an echocardiogram which was normal and it did show some aortic valve sclerosis and moderate LVH. The myocardial perfusion imaging was good and the study was normal and low risk. She has had lab work done about 4 weeks ago and this did show some low serum iron and ferritin levels. Since that time she's been on Integra. She is also on an event monitor from the cardiologist and has plans to see him again soon for follow-up of this. She has had all of her lab work done and the most significant abnormal finding was the low iron. The patient says she cannot really tell if she is feeling any better since she has been taking the Integra plus. We will check another iron level today. She says she has weakness and numbness in her lower extremities but she has generalized weakness also. She also complains of muscles and joints being sore. She does have a history of spinal stenosis and this was as a result of seeing the orthopedic surgeon and getting shots from [place]. She does complain of some chest discomfort in her upper chest at times. She also has some shortness of breath. All of this problem with weakness and feeling bad have been going on since April. She does not recall getting any tick bites. [place] but does not describe going outside a lot and working in in the yard the spring. She does not describe any tick bites. She says her bowels are working fine and she does not have any problems with passing her water. A recent urinalysis was clear. Initial blood pressure today was slightly elevated and on repeat it was elevated more at 152/92 in the right arm with a large cuff she will go back and see the cardiologist again because of the monitor in  July. Because of the spinal stenosis we will arrange for her to have an appointment with the neurosurgeon to further evaluate the complaint she has with her lower extremities.    Patient Active Problem List   Diagnosis Date Noted  . Palpitations 01/16/2017  . Fundic gland polyps of stomach, benign 07/10/2015  . Family history of colon cancer in mother - dx 13's 07/04/2015  . Overactive bladder 12/29/2014  . Chest pain 10/22/2013  . Metabolic syndrome 25/42/7062  . Severe obesity (BMI >= 40) ([place]) 05/07/2013  . Essential hypertension, benign 11/03/2012  . Vitamin D deficiency 11/03/2012  . Knee pain   . Endometrial hyperplasia   . Hemorrhoid   . Foot pain   . Esophageal stricture   . Diverticulosis   . Hip pain   . Gastritis   . Tendonitis of ankle   . Hiatal hernia   . Hyperlipidemia   . Asthma   . GERD 10/03/2009   Outpatient Encounter Prescriptions as of 02/08/2017  Medication Sig  . albuterol (PROVENTIL HFA;VENTOLIN HFA) 108 (90 Base) MCG/ACT inhaler Inhale 2 puffs into the lungs every 6 (six) hours as needed for wheezing.  Marland Kitchen aspirin 81 MG tablet Take 81 mg by mouth daily.  . budesonide-formoterol (SYMBICORT) 80-4.5 MCG/ACT inhaler Inhale 2 puffs into the lungs as needed.   . Cholecalciferol (VITAMIN D3) 3000 UNITS TABS Take 1 capsule by mouth  daily.  . diltiazem 2 % GEL Apply 1 application topically 2 (two) times daily.  Marland Kitchen FeFum-FePoly-FA-B Cmp-C-Biot (INTEGRA PLUS) CAPS Take 1 capsule by mouth daily.  Marland Kitchen levothyroxine (SYNTHROID, LEVOTHROID) 25 MCG tablet Take 1 tablet (25 mcg total) by mouth daily before breakfast.  . losartan-hydrochlorothiazide (HYZAAR) 100-12.5 MG tablet TAKE 1 TABLET BY MOUTH  DAILY  . naproxen (NAPROSYN) 500 MG tablet TAKE 1 TABLET BY MOUTH TWO  TIMES DAILY WITH A MEAL  . Omega-3 Fatty Acids ([employer] NATURAL FISH OIL) 1000 MG CAPS Take 2 capsules by mouth daily.  . pantoprazole (PROTONIX) 40 MG tablet TAKE ONE TABLET BY MOUTH  ONCE DAILY BEFORE BREAKFAST    No facility-administered encounter medications on file as of 02/08/2017.       Review of Systems  Constitutional: Positive for fatigue. Negative for fever.  HENT: Negative.   Eyes: Negative.   Respiratory: Negative.   Cardiovascular: Positive for palpitations.  Gastrointestinal: Negative.   Endocrine: Negative.   Genitourinary: Negative.   Musculoskeletal: Negative.   Skin: Negative.   Allergic/Immunologic: Negative.   Neurological: Positive for weakness.  Hematological: Negative.   Psychiatric/Behavioral: Negative.        Objective:   Physical Exam  Constitutional: She is oriented to person, place, and time. She appears well-developed and well-nourished. She appears distressed.  The patient is obviously worried about the way she is feeling and she does not have an answer to this. She does not feel any better with taking the Integra plus.  HENT:  Head: Normocephalic and atraumatic.  Right Ear: External ear normal.  Left Ear: External ear normal.  Nose: Nose normal.  Mouth/Throat: Oropharynx is clear and moist.  Eyes: Conjunctivae and EOM are normal. Pupils are equal, round, and reactive to light. Right eye exhibits no discharge. Left eye exhibits no discharge. No scleral icterus.  Neck: Normal range of motion. Neck supple. No thyromegaly present.  Cardiovascular: Normal rate, regular rhythm, normal heart sounds and intact distal pulses.   No murmur heard. Pedal pulses were slightly difficult to palpate but present The heart is regular at 72/m  Pulmonary/Chest: Effort normal and breath sounds normal. No respiratory distress. She has no wheezes. She has no rales.  Clear anteriorly and posteriorly  Abdominal: Soft. Bowel sounds are normal. She exhibits no mass. There is no tenderness. There is no rebound and no guarding.  Abdominal obesity without masses tenderness or organ enlargement  Musculoskeletal: She exhibits no edema or tenderness.  No edema detected this morning.  The patient has ongoing arthritic problems with her knees and with her back.  Lymphadenopathy:    She has no cervical adenopathy.  Neurological: She is alert and oriented to person, place, and time. She has normal reflexes. No cranial nerve deficit.  Skin: Skin is warm and dry. No rash noted.  Psychiatric: She has a normal mood and affect. Her behavior is normal. Judgment and thought content normal.  Nursing note and vitals reviewed.   BP (!) 141/80 (BP Location: Right Arm)   Pulse 73   Temp 97.2 F (36.2 C) (Oral)   Ht 5' (1.524 m)   Wt 230 lb (104.3 kg)   BMI 44.92 kg/m   Reports from cardiologists were reviewed with the patient during the visit today. Also her previous lab work was reviewed with her during the visit today. She was given an explanation for where we were moving and for the additional lab work is to be done today.     Assessment &  Plan:  1. Hypothyroidism, unspecified type - Thyroid Panel With TSH  2. Other fatigue - Thyroid Panel With TSH - Sedimentation rate - Lyme Ab/Western Blot Reflex - Rocky mtn spotted fvr abs pnl(IgG+IgM)  3. Generalized weakness -No cause has been found to explain this at this point in time. - Sedimentation rate - Lyme Ab/Western Blot Reflex - Rocky mtn spotted fvr abs pnl(IgG+IgM)  4. Ankle swelling, unspecified laterality -No swelling was noted this morning. - Sedimentation rate  5. Decreased ferritin -She has been on Integra +4 over a month. - Anemia Profile B  6. Weakness -Check sedimentation rate and tick titers -Give prescription for doxycycline 100 mg twice daily with food for 3 weeks that patient will not have filled until we tell her to.   Patient Instructions  Follow-up with cardiology as planned Arrange an appointment with neurosurgery because of spinal stenosis and weakness in legs for further evaluation Hold prescription for doxycycline pending results of lab work Return to the office in 2-4 weeks for  further evaluation  Arrie Senate MD  .

## 2017-02-12 ENCOUNTER — Ambulatory Visit: Payer: Medicare Other | Admitting: Family Medicine

## 2017-02-12 LAB — ANEMIA PROFILE B
BASOS ABS: 0 10*3/uL (ref 0.0–0.2)
BASOS: 0 %
EOS (ABSOLUTE): 0.3 10*3/uL (ref 0.0–0.4)
Eos: 5 %
FERRITIN: 28 ng/mL (ref 15–150)
Folate: >20 ng/mL (ref >3.0)
HEMATOCRIT: 40.2 % (ref 34.0–46.6)
Hemoglobin: 13.4 g/dL (ref 11.1–15.9)
Immature Grans (Abs): 0 10*3/uL (ref 0.0–0.1)
Immature Granulocytes: 0 %
Iron Saturation: 31 % (ref 15–55)
Iron: 129 ug/dL (ref 27–139)
LYMPHS: 30 %
Lymphocytes Absolute: 1.9 10*3/uL (ref 0.7–3.1)
MCH: 30.2 pg (ref 26.6–33.0)
MCHC: 33.3 g/dL (ref 31.5–35.7)
MCV: 91 fL (ref 79–97)
MONOCYTES: 5 %
MONOS ABS: 0.3 10*3/uL (ref 0.1–0.9)
Neutrophils Absolute: 3.8 10*3/uL (ref 1.4–7.0)
Neutrophils: 60 %
PLATELETS: 257 10*3/uL (ref 150–379)
RBC: 4.43 x10E6/uL (ref 3.77–5.28)
RDW: 14.8 % (ref 12.3–15.4)
RETIC CT PCT: 1.5 % (ref 0.6–2.6)
Total Iron Binding Capacity: 416 ug/dL (ref 250–450)
UIBC: 287 ug/dL (ref 118–369)
Vitamin B-12: 462 pg/mL (ref 232–1245)
WBC: 6.3 10*3/uL (ref 3.4–10.8)

## 2017-02-12 LAB — ROCKY MTN SPOTTED FVR ABS PNL(IGG+IGM)
RMSF IGG: NEGATIVE
RMSF IgM: 0.17 index (ref 0.00–0.89)

## 2017-02-12 LAB — THYROID PANEL WITH TSH
FREE THYROXINE INDEX: 1.5 (ref 1.2–4.9)
T3 Uptake Ratio: 24 % (ref 24–39)
T4, Total: 6.1 ug/dL (ref 4.5–12.0)
TSH: 3.8 u[IU]/mL (ref 0.450–4.500)

## 2017-02-12 LAB — LYME AB/WESTERN BLOT REFLEX: LYME DISEASE AB, QUANT, IGM: <0.8 index (ref 0.00–0.79)

## 2017-02-12 LAB — SEDIMENTATION RATE: SED RATE: 5 mm/h (ref 0–40)

## 2017-02-15 ENCOUNTER — Ambulatory Visit (INDEPENDENT_AMBULATORY_CARE_PROVIDER_SITE_OTHER): Payer: Medicare Other | Admitting: Cardiovascular Disease

## 2017-02-15 ENCOUNTER — Encounter: Payer: Self-pay | Admitting: Cardiovascular Disease

## 2017-02-15 VITALS — BP 112/72 | HR 69 | Ht 60.0 in | Wt 229.8 lb

## 2017-02-15 DIAGNOSIS — R0683 Snoring: Secondary | ICD-10-CM

## 2017-02-15 DIAGNOSIS — R5383 Other fatigue: Secondary | ICD-10-CM | POA: Diagnosis not present

## 2017-02-15 NOTE — Assessment & Plan Note (Signed)
30 day event Monitor showed principally PVCs.

## 2017-02-15 NOTE — Assessment & Plan Note (Signed)
Recent cardiac workup including 2-D echocardiogram and Myoview stress tests were completely normal. I am going to get an outpatient sleep study because of her daytime somnolence and history of snoring within increased BMI.

## 2017-02-15 NOTE — Patient Instructions (Signed)
Medication Instructions: Your physician recommends that you continue on your current medications as directed. Please refer to the Current Medication list given to you today.   Testing/Procedures: Your physician has recommended that you have a sleep study. This test records several body functions during sleep, including: brain activity, eye movement, oxygen and carbon dioxide blood levels, heart rate and rhythm, breathing rate and rhythm, the flow of air through your mouth and nose, snoring, body muscle movements, and chest and belly movement.  Follow-Up: Your physician wants you to follow-up in: 6 months with Dr. Gwenlyn Found. You will receive a reminder letter in the mail two months in advance. If you don't receive a letter, please call our office to schedule the follow-up appointment.  If you need a refill on your cardiac medications before your next appointment, please call your pharmacy.

## 2017-02-15 NOTE — Progress Notes (Signed)
Stacie Russell returns today for follow-up of her outpatient test done principally to diagnose reason for her excessive fatigue. Her 2-D echo was normal. A Myoview stress test was normal. Her event monitor showed only PVCs. I reassured her that it's unlikely that she has a cardiovascular etiology for her symptoms. I am going to order an outpatient sleep study because of symptoms compatible with obstructive sleep apnea.

## 2017-03-08 ENCOUNTER — Encounter: Payer: Self-pay | Admitting: Family Medicine

## 2017-03-08 DIAGNOSIS — M48061 Spinal stenosis, lumbar region without neurogenic claudication: Secondary | ICD-10-CM | POA: Insufficient documentation

## 2017-03-11 ENCOUNTER — Ambulatory Visit (INDEPENDENT_AMBULATORY_CARE_PROVIDER_SITE_OTHER): Payer: Medicare Other | Admitting: Family Medicine

## 2017-03-11 ENCOUNTER — Encounter: Payer: Self-pay | Admitting: Family Medicine

## 2017-03-11 VITALS — BP 122/73 | HR 64 | Temp 97.0°F | Ht 60.0 in | Wt 229.0 lb

## 2017-03-11 DIAGNOSIS — E039 Hypothyroidism, unspecified: Secondary | ICD-10-CM

## 2017-03-11 DIAGNOSIS — R5383 Other fatigue: Secondary | ICD-10-CM | POA: Diagnosis not present

## 2017-03-11 DIAGNOSIS — R0789 Other chest pain: Secondary | ICD-10-CM | POA: Diagnosis not present

## 2017-03-11 DIAGNOSIS — R531 Weakness: Secondary | ICD-10-CM

## 2017-03-11 DIAGNOSIS — F418 Other specified anxiety disorders: Secondary | ICD-10-CM

## 2017-03-11 DIAGNOSIS — M4800 Spinal stenosis, site unspecified: Secondary | ICD-10-CM | POA: Diagnosis not present

## 2017-03-11 DIAGNOSIS — R790 Abnormal level of blood mineral: Secondary | ICD-10-CM | POA: Diagnosis not present

## 2017-03-11 DIAGNOSIS — R0683 Snoring: Secondary | ICD-10-CM | POA: Diagnosis not present

## 2017-03-11 MED ORDER — FLUCONAZOLE 150 MG PO TABS: 150.0000 mg | ORAL_TABLET | Freq: Every day | ORAL | 1 refills | Status: DC

## 2017-03-11 MED ORDER — BUSPIRONE HCL 15 MG PO TABS: 15.0000 mg | ORAL_TABLET | Freq: Two times a day (BID) | ORAL | 0 refills | Status: DC

## 2017-03-11 NOTE — Patient Instructions (Signed)
We will call you with lab work results as soon as those results become available Take the BuSpar as needed for anxiety Get sleep study as planned We will call in a prescription for Diflucan 150 daily for 3 days for thrush and yeast infection. Continue to drink plenty of fluids and stay well hydrated Once the sedimentation rate is available we will make a decision based on the symptoms that you related today of seeing the pulmonologist and or a rheumatologist. Follow-up with cardiology as planned.

## 2017-03-11 NOTE — Progress Notes (Signed)
Subjective:    Patient ID: Stacie Russell, female    DOB: 10-26-1950, 67 y.o.   MRN: [id number]  HPI Patient here today for 4 week follow up on fatigue, myalgia and weakness. The patient has been to see the cardiologist and has had several procedures from him. These include myocardial perfusion imaging echocardiogram and telemetry. A sleep study is also pending. The patient took doxycycline with no history of a tick bite and overall this did not make her feel any better. She is still feeling bad. She is also worried about her husband has been diagnosed with bladder cancer. The patient is on omeprazole for her stomach from the gastroenterologist. She denies any chest pain but says she does have some chest pressure and this is mostly at nighttime. The husband reconfirms that she is snoring quite a bit and all during the night. He is not aware of her having any episodes of apnea. She denies any blood in the stool or black tarry bowel movements. Her sleep apnea study is not until September. She does have an appointment with the neurosurgeon because of her back pain and issues in this appointment is coming up soon. She is passing her water without problems.      Patient Active Problem List   Diagnosis Date Noted  . Spinal stenosis of lumbar region 03/08/2017  . Fatigue 02/15/2017  . Palpitations 01/16/2017  . Fundic gland polyps of stomach, benign 07/10/2015  . Family history of colon cancer in mother - dx 20's 07/04/2015  . Overactive bladder 12/29/2014  . Chest pain 10/22/2013  . Metabolic syndrome 03/40/3524  . Severe obesity (BMI >= 40) ([place]) 05/07/2013  . Essential hypertension, benign 11/03/2012  . Vitamin D deficiency 11/03/2012  . Knee pain   . Endometrial hyperplasia   . Hemorrhoid   . Foot pain   . Esophageal stricture   . Diverticulosis   . Hip pain   . Gastritis   . Tendonitis of ankle   . Hiatal hernia   . Hyperlipidemia   . Asthma   . GERD 10/03/2009   Outpatient  Encounter Prescriptions as of 03/11/2017  Medication Sig  . aspirin 81 MG tablet Take 81 mg by mouth daily.  . Cholecalciferol (VITAMIN D3) 3000 UNITS TABS Take 1 capsule by mouth daily.  Marland Kitchen FeFum-FePoly-FA-B Cmp-C-Biot (INTEGRA PLUS) CAPS Take 1 capsule by mouth daily.  Marland Kitchen levothyroxine (SYNTHROID, LEVOTHROID) 25 MCG tablet Take 1 tablet (25 mcg total) by mouth daily before breakfast.  . losartan-hydrochlorothiazide (HYZAAR) 100-12.5 MG tablet TAKE 1 TABLET BY MOUTH  DAILY  . naproxen (NAPROSYN) 500 MG tablet TAKE 1 TABLET BY MOUTH TWO  TIMES DAILY WITH A MEAL  . Omega-3 Fatty Acids ([employer] NATURAL FISH OIL) 1000 MG CAPS Take 2 capsules by mouth daily.  . pantoprazole (PROTONIX) 40 MG tablet TAKE ONE TABLET BY MOUTH  ONCE DAILY BEFORE BREAKFAST  . [DISCONTINUED] albuterol (PROVENTIL HFA;VENTOLIN HFA) 108 (90 Base) MCG/ACT inhaler Inhale 2 puffs into the lungs every 6 (six) hours as needed for wheezing.  . [DISCONTINUED] doxycycline (VIBRA-TABS) 100 MG tablet Take 1 tablet (100 mg total) by mouth 2 (two) times daily. 1 po bid   No facility-administered encounter medications on file as of 03/11/2017.      Review of Systems  Constitutional: Positive for fatigue.  HENT: Positive for congestion and sore throat.   Eyes: Negative.   Respiratory: Positive for cough and chest tightness.   Cardiovascular: Negative.   Gastrointestinal: Negative.  Endocrine: Negative.   Genitourinary: Negative.   Musculoskeletal: Positive for myalgias (shoulders ).  Skin: Negative.   Allergic/Immunologic: Negative.   Neurological: Positive for weakness.  Hematological: Negative.   Psychiatric/Behavioral: Negative.        Objective:   Physical Exam  Constitutional: She is oriented to person, place, and time. She appears well-developed and well-nourished. No distress.  HENT:  Head: Normocephalic and atraumatic.  Right Ear: External ear normal.  Left Ear: External ear normal.  Nose: Nose normal.  Mouth/Throat:  Oropharynx is clear and moist. No oropharyngeal exudate.  Minimal nasal congestion bilaterally  Eyes: Pupils are equal, round, and reactive to light. Conjunctivae and EOM are normal. Right eye exhibits no discharge. Left eye exhibits no discharge. No scleral icterus.  Neck: Normal range of motion. Neck supple. No thyromegaly present.  No bruits thyromegaly or anterior cervical adenopathy  Cardiovascular: Normal rate, regular rhythm, normal heart sounds and intact distal pulses.   No murmur heard. The heart is regular at 60/m  Pulmonary/Chest: Effort normal and breath sounds normal. No respiratory distress. She has no wheezes. She has no rales.  Clear anteriorly and posteriorly  Abdominal: Soft. Bowel sounds are normal. She exhibits no mass. There is no tenderness. There is no rebound and no guarding.  Abdominal obesity without masses tenderness or organ enlargement  Musculoskeletal: Normal range of motion. She exhibits no edema.  Lymphadenopathy:    She has no cervical adenopathy.  Neurological: She is alert and oriented to person, place, and time. She has normal reflexes. No cranial nerve deficit.  Skin: Skin is warm and dry. Rash noted. There is erythema.  Several small insect bites on upper thorax posteriorly  Psychiatric: She has a normal mood and affect. Her behavior is normal. Judgment and thought content normal.  Increased anxiety  Nursing note and vitals reviewed.   BP (!) 145/79 (BP Location: Right Arm)   Pulse 77   Temp (!) 97 F (36.1 C) (Oral)   Ht 5' (1.524 m)   Wt 229 lb (103.9 kg)   BMI 44.72 kg/m        Assessment & Plan:  1. Hypothyroidism, unspecified type - CBC with Differential/Platelet - Thyroid Panel With TSH  2. Other fatigue - CBC with Differential/Platelet - Sedimentation rate  3. Generalized weakness -This is been an ongoing problem as best he worse since April of this year. - CBC with Differential/Platelet - BMP8+EGFR - Sedimentation  rate  4. Decreased ferritin - CBC with Differential/Platelet  5. Chest pressure -Consider referral to pulmonology  6. Snoring -Awaiting sleep study in September  7. Spinal stenosis, unspecified spinal region -Neurosurgery tomorrow  8. Severe obesity (BMI >= 40) ([facility]) -Continue with aggressive therapeutic lifestyle changes including diet and exercise to achieve weight loss  9. Morbid obesity due to excess calories ([place]) -As above  10. Anxiety about health -BuSpar 15 mg one twice daily if needed  Meds ordered this encounter  Medications  . fluconazole (DIFLUCAN) 150 MG tablet    Sig: Take 1 tablet (150 mg total) by mouth daily.    Dispense:  3 tablet    Refill:  1  . busPIRone (BUSPAR) 15 MG tablet    Sig: Take 1 tablet (15 mg total) by mouth 2 (two) times daily.    Dispense:  60 tablet    Refill:  0   Patient Instructions  We will call you with lab work results as soon as those results become available Take the BuSpar as needed  for anxiety Get sleep study as planned We will call in a prescription for Diflucan 150 daily for 3 days for thrush and yeast infection. Continue to drink plenty of fluids and stay well hydrated Once the sedimentation rate is available we will make a decision based on the symptoms that you related today of seeing the pulmonologist and or a rheumatologist. Follow-up with cardiology as planned.  Arrie Senate MD

## 2017-03-12 ENCOUNTER — Other Ambulatory Visit: Payer: Self-pay

## 2017-03-12 DIAGNOSIS — M2550 Pain in unspecified joint: Secondary | ICD-10-CM | POA: Diagnosis not present

## 2017-03-12 DIAGNOSIS — M48061 Spinal stenosis, lumbar region without neurogenic claudication: Secondary | ICD-10-CM | POA: Diagnosis not present

## 2017-03-12 DIAGNOSIS — R0683 Snoring: Secondary | ICD-10-CM

## 2017-03-12 DIAGNOSIS — R0789 Other chest pain: Secondary | ICD-10-CM

## 2017-03-12 LAB — CBC WITH DIFFERENTIAL/PLATELET
BASOS ABS: 0 10*3/uL (ref 0.0–0.2)
Basos: 0 %
EOS (ABSOLUTE): 0.3 10*3/uL (ref 0.0–0.4)
Eos: 4 %
HEMOGLOBIN: 14 g/dL (ref 11.1–15.9)
Hematocrit: 41 % (ref 34.0–46.6)
IMMATURE GRANS (ABS): 0 10*3/uL (ref 0.0–0.1)
Immature Granulocytes: 0 %
LYMPHS ABS: 1.9 10*3/uL (ref 0.7–3.1)
LYMPHS: 25 %
MCH: 30.6 pg (ref 26.6–33.0)
MCHC: 34.1 g/dL (ref 31.5–35.7)
MCV: 90 fL (ref 79–97)
MONOCYTES: 7 %
Monocytes Absolute: 0.5 10*3/uL (ref 0.1–0.9)
NEUTROS ABS: 4.7 10*3/uL (ref 1.4–7.0)
Neutrophils: 64 %
Platelets: 239 10*3/uL (ref 150–379)
RBC: 4.58 x10E6/uL (ref 3.77–5.28)
RDW: 15.5 % — ABNORMAL HIGH (ref 12.3–15.4)
WBC: 7.4 10*3/uL (ref 3.4–10.8)

## 2017-03-12 LAB — BMP8+EGFR
BUN / CREAT RATIO: 17 (ref 12–28)
BUN: 12 mg/dL (ref 8–27)
CO2: 23 mmol/L (ref 20–29)
CREATININE: 0.72 mg/dL (ref 0.57–1.00)
Calcium: 9.2 mg/dL (ref 8.7–10.3)
Chloride: 100 mmol/L (ref 96–106)
GFR calc non Af Amer: 87 mL/min/{1.73_m2} (ref >59)
GFR, EST AFRICAN AMERICAN: 100 mL/min/{1.73_m2} (ref >59)
GLUCOSE: 89 mg/dL (ref 65–99)
Potassium: 3.9 mmol/L (ref 3.5–5.2)
SODIUM: 141 mmol/L (ref 134–144)

## 2017-03-12 LAB — THYROID PANEL WITH TSH
FREE THYROXINE INDEX: 1.7 (ref 1.2–4.9)
T3 Uptake Ratio: 27 % (ref 24–39)
T4, Total: 6.3 ug/dL (ref 4.5–12.0)
TSH: 3.96 u[IU]/mL (ref 0.450–4.500)

## 2017-03-12 LAB — SEDIMENTATION RATE: SED RATE: 2 mm/h (ref 0–40)

## 2017-04-02 ENCOUNTER — Other Ambulatory Visit: Payer: Self-pay | Admitting: *Deleted

## 2017-04-02 DIAGNOSIS — R531 Weakness: Secondary | ICD-10-CM

## 2017-04-02 DIAGNOSIS — M2550 Pain in unspecified joint: Secondary | ICD-10-CM

## 2017-04-02 DIAGNOSIS — R0789 Other chest pain: Secondary | ICD-10-CM

## 2017-04-02 DIAGNOSIS — R5383 Other fatigue: Secondary | ICD-10-CM

## 2017-04-02 NOTE — Progress Notes (Signed)
Pt called and states she needs a referral to DR Amil Amen - they will not make her an appt without referral and notes from Dr Laurance Flatten.  Referral placed - pt aware

## 2017-04-05 ENCOUNTER — Other Ambulatory Visit: Payer: Self-pay | Admitting: *Deleted

## 2017-04-05 MED ORDER — BUSPIRONE HCL 15 MG PO TABS: 15.0000 mg | ORAL_TABLET | Freq: Three times a day (TID) | ORAL | 1 refills | Status: DC | PRN

## 2017-04-17 ENCOUNTER — Ambulatory Visit (HOSPITAL_BASED_OUTPATIENT_CLINIC_OR_DEPARTMENT_OTHER): Payer: Medicare Other | Attending: Cardiovascular Disease | Admitting: Cardiovascular Disease

## 2017-04-17 VITALS — Ht 60.0 in | Wt 230.0 lb

## 2017-04-17 DIAGNOSIS — R5383 Other fatigue: Secondary | ICD-10-CM | POA: Insufficient documentation

## 2017-04-17 DIAGNOSIS — G4733 Obstructive sleep apnea (adult) (pediatric): Secondary | ICD-10-CM | POA: Diagnosis not present

## 2017-04-17 DIAGNOSIS — R0683 Snoring: Secondary | ICD-10-CM | POA: Diagnosis not present

## 2017-04-17 DIAGNOSIS — Z1231 Encounter for screening mammogram for malignant neoplasm of breast: Secondary | ICD-10-CM | POA: Diagnosis not present

## 2017-05-10 ENCOUNTER — Telehealth: Payer: Self-pay | Admitting: Family Medicine

## 2017-05-10 MED ORDER — BUSPIRONE HCL 15 MG PO TABS: 15.0000 mg | ORAL_TABLET | Freq: Three times a day (TID) | ORAL | 1 refills | Status: DC | PRN

## 2017-05-10 MED ORDER — PANTOPRAZOLE SODIUM 40 MG PO TBEC: DELAYED_RELEASE_TABLET | ORAL | 1 refills | Status: DC

## 2017-05-10 NOTE — Telephone Encounter (Signed)
What is the name of the medication? Pantoprazole, buspirone Have you contacted your pharmacy to request a refill? Pharmacy is calling  Which pharmacy would you like this sent to? [facility] has sent over fax   Patient notified that their request is being sent to the clinical staff for review and that they should receive a call once it is complete. If they do not receive a call within 24 hours they can check with their pharmacy or our office.

## 2017-05-14 ENCOUNTER — Other Ambulatory Visit: Payer: Medicare Other

## 2017-05-14 DIAGNOSIS — R531 Weakness: Secondary | ICD-10-CM | POA: Diagnosis not present

## 2017-05-14 DIAGNOSIS — R5383 Other fatigue: Secondary | ICD-10-CM | POA: Diagnosis not present

## 2017-05-14 DIAGNOSIS — E039 Hypothyroidism, unspecified: Secondary | ICD-10-CM | POA: Diagnosis not present

## 2017-05-14 DIAGNOSIS — R790 Abnormal level of blood mineral: Secondary | ICD-10-CM | POA: Diagnosis not present

## 2017-05-15 LAB — THYROID PANEL WITH TSH
Free Thyroxine Index: 1.4 (ref 1.2–4.9)
T3 UPTAKE RATIO: 24 % (ref 24–39)
T4, Total: 5.8 ug/dL (ref 4.5–12.0)
TSH: 3.99 u[IU]/mL (ref 0.450–4.500)

## 2017-05-15 LAB — ANEMIA PROFILE B
BASOS: 0 %
Basophils Absolute: 0 10*3/uL (ref 0.0–0.2)
EOS (ABSOLUTE): 0.3 10*3/uL (ref 0.0–0.4)
Eos: 4 %
FERRITIN: 45 ng/mL (ref 15–150)
Folate: 16.1 ng/mL (ref >3.0)
HEMATOCRIT: 41.4 % (ref 34.0–46.6)
Hemoglobin: 13.8 g/dL (ref 11.1–15.9)
IRON SATURATION: 37 % (ref 15–55)
Immature Grans (Abs): 0 10*3/uL (ref 0.0–0.1)
Immature Granulocytes: 0 %
Iron: 126 ug/dL (ref 27–139)
Lymphocytes Absolute: 1.8 10*3/uL (ref 0.7–3.1)
Lymphs: 27 %
MCH: 30.7 pg (ref 26.6–33.0)
MCHC: 33.3 g/dL (ref 31.5–35.7)
MCV: 92 fL (ref 79–97)
MONOS ABS: 0.4 10*3/uL (ref 0.1–0.9)
Monocytes: 6 %
NEUTROS ABS: 4.3 10*3/uL (ref 1.4–7.0)
Neutrophils: 63 %
Platelets: 228 10*3/uL (ref 150–379)
RBC: 4.49 x10E6/uL (ref 3.77–5.28)
RDW: 13.5 % (ref 12.3–15.4)
Retic Ct Pct: 1.3 % (ref 0.6–2.6)
Total Iron Binding Capacity: 341 ug/dL (ref 250–450)
UIBC: 215 ug/dL (ref 118–369)
VITAMIN B 12: 402 pg/mL (ref 232–1245)
WBC: 6.8 10*3/uL (ref 3.4–10.8)

## 2017-05-15 LAB — CMP14+EGFR
A/G RATIO: 2 (ref 1.2–2.2)
ALT: 19 IU/L (ref 0–32)
AST: 15 IU/L (ref 0–40)
Albumin: 4.2 g/dL (ref 3.6–4.8)
Alkaline Phosphatase: 77 IU/L (ref 39–117)
BUN/Creatinine Ratio: 23 (ref 12–28)
BUN: 17 mg/dL (ref 8–27)
Bilirubin Total: 0.5 mg/dL (ref 0.0–1.2)
CALCIUM: 9.3 mg/dL (ref 8.7–10.3)
CO2: 25 mmol/L (ref 20–29)
CREATININE: 0.74 mg/dL (ref 0.57–1.00)
Chloride: 104 mmol/L (ref 96–106)
GFR calc Af Amer: 97 mL/min/{1.73_m2} (ref >59)
GFR, EST NON AFRICAN AMERICAN: 84 mL/min/{1.73_m2} (ref >59)
GLUCOSE: 90 mg/dL (ref 65–99)
Globulin, Total: 2.1 g/dL (ref 1.5–4.5)
Potassium: 4 mmol/L (ref 3.5–5.2)
Sodium: 141 mmol/L (ref 134–144)
Total Protein: 6.3 g/dL (ref 6.0–8.5)

## 2017-05-15 LAB — SEDIMENTATION RATE: Sed Rate: 9 mm/hr (ref 0–40)

## 2017-05-26 NOTE — Procedures (Signed)
Patient Name: Stacie Russell, [place] Date: 04/17/2017 Gender: Female D.O.B: [birth year] Age (years): 65 Referring Provider: Lorretta Harp Height (inches): 27 Interpreting Physician: Shelva Majestic MD, ABSM Weight (lbs): 230 RPSGT: [place], [place] BMI: 45 MRN: [id number] Neck Size: 15.00  CLINICAL INFORMATION Sleep Study Type: NPSG  Indication for sleep study: Fatigue, Snoring  Epworth Sleepiness Score: 8  SLEEP STUDY TECHNIQUE As per the AASM Manual for the Scoring of Sleep and Associated Events v2.3 (April 2016) with a hypopnea requiring 4% desaturations.  The channels recorded and monitored were frontal, central and occipital EEG, electrooculogram (EOG), submentalis EMG (chin), nasal and oral airflow, thoracic and abdominal wall motion, anterior tibialis EMG, snore microphone, electrocardiogram, and pulse oximetry.  MEDICATIONS *    albuterol (PROVENTIL HFA;VENTOLIN HFA) 108 (90 Base) MCG/ACT inhaler    Inhale 2 puffs into the lungs every 6 (six) hours as needed for wheezing.    3 Inhaler    3 *    aspirin 81 MG tablet    Take 81 mg by mouth daily.           *    budesonide-formoterol (SYMBICORT) 80-4.5 MCG/ACT inhaler    Inhale 2 puffs into the lungs as needed.            *    Cholecalciferol (VITAMIN D3) 3000 UNITS TABS    Take 1 capsule by mouth daily.           *    diltiazem 2 % GEL    Apply 1 application topically 2 (two) times daily.    30 g    3 *    FeFum-FePoly-FA-B Cmp-C-Biot (INTEGRA PLUS) CAPS    Take 1 capsule by mouth daily.    30 capsule    6 *    levothyroxine (SYNTHROID, LEVOTHROID) 25 MCG tablet    Take 1 tablet (25 mcg total) by mouth daily before breakfast.    30 tablet    6 *    losartan-hydrochlorothiazide (HYZAAR) 100-12.5 MG tablet    TAKE 1 TABLET BY MOUTH  DAILY    90 tablet    1 *    Omega-3 Fatty Acids ([employer] NATURAL FISH OIL) 1000 MG CAPS    Take 2 capsules by mouth daily.           *    pantoprazole (PROTONIX) 40 MG tablet    TAKE ONE TABLET  BY MOUTH  ONCE DAILY BEFORE BREAKFAST          Medications self-administered by patient taken the night of the study : LOSARTAN, HYDROCHLOROTHIAZIDE, OXYBUTYNIN CHLORIDE, PANTOPRAZOLE SODIUM, BUSPIRONE HCL  SLEEP ARCHITECTURE The study was initiated at 10:40:32 PM and ended at 5:09:46 AM.  Sleep onset time was 4.3 minutes and the sleep efficiency was 85.4%. The total sleep time was 332.5 minutes.  Stage REM latency was 120.5 minutes.  The patient spent 10.98% of the night in stage N1 sleep, 52.18% in stage N2 sleep, 26.02% in stage N3 and 10.83% in REM.  Alpha intrusion was absent.  Supine sleep was 26.84%.  RESPIRATORY PARAMETERS The overall apnea/hypopnea index (AHI) was 15.7 per hour. There were 16 total apneas, including 13 obstructive, 3 central and 0 mixed apneas. There were 71 hypopneas and 67 RERAs.  The AHI during Stage REM sleep was 40.0 per hour.  AHI while supine was 21.5 per hour.  The mean oxygen saturation was 93.89%. The minimum SpO2 during sleep was 78.00%.  Loud snoring was noted during  this study.  CARDIAC DATA The 2 lead EKG demonstrated sinus rhythm. The mean heart rate was 75.29 beats per minute. Other EKG findings include: PVCs.  LEG MOVEMENT DATA The total PLMS were 76 with a resulting PLMS index of 13.72. Associated arousal with leg movement index was 0.9 .  IMPRESSIONS - Moderate obstructive sleep apnea overall (AHI 15.7/h); however, events were worse  with supine position ( AHI 21.5/h) and severe during REM sleep (AHI 40/h). - No significant central sleep apnea occurred during this study (CAI = 0.5/h). - Moderate oxygen desaturation to a nadir of  80% with NREM and 78% during REM sleep. - The patient snored with loud snoring volume. - EKG findings include PVCs. - Mild periodic limb movements of sleep occurred during the study. No significant associated arousals.  DIAGNOSIS - Obstructive Sleep Apnea (327.23 [G47.33 ICD-10]) - Nocturnal Hypoxemia  (327.26 [G47.36 ICD-10])  RECOMMENDATIONS - Recommend  CPAP titration to determine optimal pressure required to alleviate sleep disordered breathing. - Efforts should be made to optimize nasal ans pharyngeal patency. - Avoid alcohol, sedatives and other CNS depressants that may worsen sleep apnea and disrupt normal sleep architecture. - Sleep hygiene should be reviewed to assess factors that may improve sleep quality. - Weight management and regular exercise should be initiated or continued if appropriate.  [Electronically signed] 05/26/2017 06:36 PM  Shelva Majestic MD, [facility], ABSM Diplomate, American Board of Sleep Medicine   NPI: [id number] [place] PH: [phone]   FX: [phone] [place]

## 2017-05-27 NOTE — Telephone Encounter (Signed)
It is okay to refill and pantoprazole and buspirone

## 2017-05-31 ENCOUNTER — Telehealth: Payer: Self-pay | Admitting: *Deleted

## 2017-05-31 DIAGNOSIS — G4733 Obstructive sleep apnea (adult) (pediatric): Secondary | ICD-10-CM

## 2017-05-31 NOTE — Telephone Encounter (Signed)
CPAP titration scheduled for 11/5  Left message for patient to call back

## 2017-05-31 NOTE — Telephone Encounter (Signed)
-----   Message from Troy Sine, MD sent at 05/26/2017  6:47 PM EDT ----- Angie Fava, please notify pt the results and schedule for CPAP titration study

## 2017-06-10 ENCOUNTER — Other Ambulatory Visit: Payer: Self-pay | Admitting: Family

## 2017-06-10 ENCOUNTER — Other Ambulatory Visit: Payer: Self-pay | Admitting: Family Medicine

## 2017-06-13 NOTE — Telephone Encounter (Signed)
Spoke to patient, aware of sleep study results and aware CPAP titration is scheduled for 11/5.

## 2017-06-17 ENCOUNTER — Ambulatory Visit (HOSPITAL_BASED_OUTPATIENT_CLINIC_OR_DEPARTMENT_OTHER): Payer: Medicare Other | Attending: Cardiovascular Disease | Admitting: Cardiovascular Disease

## 2017-06-17 DIAGNOSIS — G4733 Obstructive sleep apnea (adult) (pediatric): Secondary | ICD-10-CM | POA: Insufficient documentation

## 2017-06-18 ENCOUNTER — Telehealth: Payer: Self-pay | Admitting: Cardiovascular Disease

## 2017-06-18 NOTE — Telephone Encounter (Signed)
Husband  Stacie Russell (spouse) (DPR) informed as she is not home he will relay message to her.

## 2017-06-18 NOTE — Telephone Encounter (Signed)
Pt wants to kno who is she supposed to be seeing for her sleep problems?Dr Claiborne Billings or Dr Baird Lyons?

## 2017-06-20 ENCOUNTER — Other Ambulatory Visit: Payer: Self-pay | Admitting: Family Medicine

## 2017-06-21 ENCOUNTER — Institutional Professional Consult (permissible substitution): Payer: Medicare Other | Admitting: Internal Medicine

## 2017-06-24 ENCOUNTER — Telehealth: Payer: Self-pay | Admitting: Cardiovascular Disease

## 2017-06-24 ENCOUNTER — Encounter (HOSPITAL_BASED_OUTPATIENT_CLINIC_OR_DEPARTMENT_OTHER): Payer: Self-pay | Admitting: Cardiovascular Disease

## 2017-06-24 DIAGNOSIS — M2550 Pain in unspecified joint: Secondary | ICD-10-CM | POA: Diagnosis not present

## 2017-06-24 DIAGNOSIS — M791 Myalgia, unspecified site: Secondary | ICD-10-CM | POA: Diagnosis not present

## 2017-06-24 NOTE — Telephone Encounter (Signed)
waiting for Dr Claiborne Billings to review Tried to notify pt, mailbox is full-unable to leave message Will try again later

## 2017-06-24 NOTE — Telephone Encounter (Signed)
Pt notified-waiting for Dr Claiborne Billings to review

## 2017-06-24 NOTE — Telephone Encounter (Signed)
Pt would like her sleep study results from last Monday please.

## 2017-06-24 NOTE — Procedures (Signed)
Patient Name: Stacie Russell, Stacie Russell Date: 06/17/2017 Gender: Female D.O.B: [birth year] Age (years): 65 Referring Provider: Lorretta Harp Height (inches): 60 Interpreting Physician: Shelva Majestic MD, ABSM Weight (lbs): 230 RPSGT: Laren Everts BMI: 72 MRN: [id number] Neck Size: 15.00  CLINICAL INFORMATION The patient is referred for a CPAP titration to treat sleep apnea.  Date of NPSG: 04/17/2017: AHI 15.7/h; RDI 27.8/h;  Supine sleep AHI 21.5/h;  AHI during REM sleep 40.0/h; oxygen desaturation to 78%.  SLEEP STUDY TECHNIQUE As per the AASM Manual for the Scoring of Sleep and Associated Events v2.3 (April 2016) with a hypopnea requiring 4% desaturations.  The channels recorded and monitored were frontal, central and occipital EEG, electrooculogram (EOG), submentalis EMG (chin), nasal and oral airflow, thoracic and abdominal wall motion, anterior tibialis EMG, snore microphone, electrocardiogram, and pulse oximetry. Continuous positive airway pressure (CPAP) was initiated at the beginning of the study and titrated to treat sleep-disordered breathing.  MEDICATIONS *    albuterol (PROVENTIL HFA;VENTOLIN HFA) 108 (90 Base) MCG/ACT inhaler    Inhale 2 puffs into the lungs every 6 (six) hours as needed for wheezing.     *    aspirin 81 MG tablet    Take 81 mg by mouth daily.           *    budesonide-formoterol (SYMBICORT) 80-4.5 MCG/ACT inhaler    Inhale 2 puffs into the lungs as needed.            *    Cholecalciferol (VITAMIN D3) 3000 UNITS TABS    Take 1 capsule by mouth daily.           *    diltiazem 2 % GEL    Apply 1 application topically 2 (two) times daily.     *    FeFum-FePoly-FA-B Cmp-C-Biot (INTEGRA PLUS) CAPS    Take 1 capsule by mouth daily.     *    levothyroxine (SYNTHROID, LEVOTHROID) 25 MCG tablet    Take 1 tablet (25 mcg total) by mouth daily before breakfast.    *    losartan-hydrochlorothiazide (HYZAAR) 100-12.5 MG tablet    TAKE 1 TABLET BY MOUTH  DAILY      *    Omega-3 Fatty Acids ([employer] NATURAL FISH OIL) 1000 MG CAPS    Take 2 capsules by mouth daily.           *    pantoprazole (PROTONIX) 40 MG tablet    TAKE ONE TABLET BY MOUTH  ONCE DAILY BEFORE BREAKFAST         Medications self-administered by patient taken the night of the study : OXYBUTYNIN CHLORIDE  TECHNICIAN COMMENTS Comments added by technician: Patient was restless all through the night.  Comments added by scorer: N/A  RESPIRATORY PARAMETERS Optimal PAP Pressure (cm): 14 AHI at Optimal Pressure (/hr): 12.7 Overall Minimal O2 (%): 85.00 Supine % at Optimal Pressure (%): 0 Minimal O2 at Optimal Pressure (%): 85.0    SLEEP ARCHITECTURE The study was initiated at 10:40:38 PM and ended at 5:13:40 AM.  Sleep onset time was 1.4 minutes and the sleep efficiency was 65.0%. The total sleep time was 255.5 minutes.  The patient spent 9.00% of the night in stage N1 sleep, 67.12% in stage N2 sleep, 1.76% in stage N3 and 22.11% in REM.Stage REM latency was 70.5 minutes  Wake after sleep onset was 136.1. Alpha intrusion was absent. Supine sleep was 33.46%.  CARDIAC DATA The 2 lead EKG demonstrated  sinus rhythm. The mean heart rate was 68.20 beats per minute. Other EKG findings include: PVCs.  LEG MOVEMENT DATA The total Periodic Limb Movements of Sleep (PLMS) were 0. The PLMS index was 0.00. A PLMS index of <15 is considered normal in adults.  IMPRESSIONS - CPAP was initiated at 5 cm and was titrated up to 14 cm.  AHI at CPAP 8 cm was 9.4/h with an RDI of 28.2/h.  At CPAP pressures above 8 cm central events were present.  AHI at CPAP of 14 was 12.7, and oxygen desaturation was 85%.  An optimal pressure was not achieved. - Central sleep apnea was not noted during this titration (CAI = 2.1/h). - Moderate oxygen desaturations to a nadir of 85%. - The patient snored with moderate snoring volume during this titration study. - 2-lead EKG demonstrated: PVCs with episodes of trigeminy and  quadrigeminy. - Clinically significant periodic limb movements were not noted during this study. Arousals associated with PLMs were rare.  DIAGNOSIS - Obstructive Sleep Apnea (327.23 [G47.33 ICD-10])  RECOMMENDATIONS - Due to suboptimal CPAP titration and development of central apneic events recommend BiPAP titration for more optimal treatment.  - Efforts should be made to optimize nasal and oral pharyngeal patency. - Avoid alcohol, sedatives and other CNS depressants that may worsen sleep apnea and disrupt normal sleep architecture. - Sleep hygiene should be reviewed to assess factors that may improve sleep quality. - Weight management and regular exercise should be initiated or continued. -  [Electronically signed] 06/24/2017 05:12 PM  Shelva Majestic MD, Tomasa Hose Diplomate, American Board of Sleep Medicine   NPI: [id number] [place] PH: [phone]   FX: [phone] [place]

## 2017-06-26 ENCOUNTER — Telehealth: Payer: Self-pay | Admitting: *Deleted

## 2017-06-26 DIAGNOSIS — G4733 Obstructive sleep apnea (adult) (pediatric): Secondary | ICD-10-CM

## 2017-06-26 NOTE — Telephone Encounter (Signed)
Patient aware and verbalized understanding, Bipap titration ordered and sent to pre cert.

## 2017-06-26 NOTE — Telephone Encounter (Signed)
-----   Message from Troy Sine, MD sent at 06/24/2017  5:19 PM EST ----- Stacie Russell: Please notify patient results of her CPAP titration.  CPAP was suboptimal.  She had central events.  I have recommended a BiPAP titration study, which will need to be scheduled.

## 2017-07-02 ENCOUNTER — Ambulatory Visit: Payer: Medicare Other | Admitting: Orthopaedic Surgery

## 2017-07-03 ENCOUNTER — Encounter: Payer: Self-pay | Admitting: Orthopaedic Surgery

## 2017-07-10 ENCOUNTER — Ambulatory Visit: Payer: Medicare Other | Admitting: Family Medicine

## 2017-07-15 ENCOUNTER — Ambulatory Visit (INDEPENDENT_AMBULATORY_CARE_PROVIDER_SITE_OTHER): Payer: Medicare Other | Admitting: Family Medicine

## 2017-07-15 ENCOUNTER — Encounter: Payer: Self-pay | Admitting: Family Medicine

## 2017-07-15 VITALS — BP 133/74 | HR 72 | Temp 97.1°F | Ht 60.0 in | Wt 227.0 lb

## 2017-07-15 DIAGNOSIS — E559 Vitamin D deficiency, unspecified: Secondary | ICD-10-CM

## 2017-07-15 DIAGNOSIS — K219 Gastro-esophageal reflux disease without esophagitis: Secondary | ICD-10-CM

## 2017-07-15 DIAGNOSIS — I10 Essential (primary) hypertension: Secondary | ICD-10-CM

## 2017-07-15 DIAGNOSIS — E039 Hypothyroidism, unspecified: Secondary | ICD-10-CM | POA: Diagnosis not present

## 2017-07-15 DIAGNOSIS — M4800 Spinal stenosis, site unspecified: Secondary | ICD-10-CM

## 2017-07-15 DIAGNOSIS — E8881 Metabolic syndrome: Secondary | ICD-10-CM | POA: Diagnosis not present

## 2017-07-15 DIAGNOSIS — R5383 Other fatigue: Secondary | ICD-10-CM | POA: Diagnosis not present

## 2017-07-15 DIAGNOSIS — M150 Primary generalized (osteo)arthritis: Secondary | ICD-10-CM | POA: Diagnosis not present

## 2017-07-15 DIAGNOSIS — M159 Polyosteoarthritis, unspecified: Secondary | ICD-10-CM

## 2017-07-15 LAB — URINALYSIS, COMPLETE
Bilirubin, UA: NEGATIVE
Glucose, UA: NEGATIVE
KETONES UA: NEGATIVE
Leukocytes, UA: NEGATIVE
NITRITE UA: NEGATIVE
PH UA: 6.5 (ref 5.0–7.5)
Protein, UA: NEGATIVE
RBC, UA: NEGATIVE
SPEC GRAV UA: 1.01 (ref 1.005–1.030)
Urobilinogen, Ur: 0.2 mg/dL (ref 0.2–1.0)

## 2017-07-15 LAB — MICROSCOPIC EXAMINATION
Bacteria, UA: NONE SEEN
RBC, UA: NONE SEEN /hpf (ref 0–?)
RENAL EPITHEL UA: NONE SEEN /HPF
WBC UA: NONE SEEN /HPF (ref 0–?)

## 2017-07-15 MED ORDER — FUROSEMIDE 20 MG PO TABS: 20.0000 mg | ORAL_TABLET | Freq: Every day | ORAL | 1 refills | Status: DC | PRN

## 2017-07-15 NOTE — Patient Instructions (Addendum)
Medicare Annual Wellness Visit  [facility] and the medical providers at [facility] strive to bring you the best medical care.  In doing so we not only want to address your current medical conditions and concerns but also to detect new conditions early and prevent illness, disease and health-related problems.    Medicare offers a yearly Wellness Visit which allows our clinical staff to assess your need for preventative services including immunizations, lifestyle education, counseling to decrease risk of preventable diseases and screening for fall risk and other medical concerns.    This visit is provided free of charge (no copay) for all Medicare recipients. The clinical pharmacists at [facility] have begun to conduct these Wellness Visits which will also include a thorough review of all your medications.    As you primary medical provider recommend that you make an appointment for your Annual Wellness Visit if you have not done so already this year.  You may set up this appointment before you leave today or you may call back ([phone]) and schedule an appointment.  Please make sure when you call that you mention that you are scheduling your Annual Wellness Visit with the clinical pharmacist so that the appointment may be made for the proper length of time.     Continue current medications. Continue good therapeutic lifestyle changes which include good diet and exercise. Fall precautions discussed with patient. If an FOBT was given today- please return it to our front desk. If you are over 54 years old - you may need Prevnar 70 or the adult Pneumonia vaccine.  **Flu shots are available--- please call and schedule a FLU-CLINIC appointment**  After your visit with [place] today you will receive a survey in the mail or online from [employer] regarding your care with [place]. Please take a moment to fill this out. Your feedback is very  important to [place] as you can help [place] better understand your patient needs as well as improve your experience and satisfaction. WE CARE ABOUT YOU!!!   Try a probiotic like align and get the generic version Follow-up with orthopedic surgeon as planned Get sleep evaluation as planned

## 2017-07-15 NOTE — Progress Notes (Signed)
Subjective:    Patient ID: Stacie Russell, female    DOB: 02-26-1951, 66 y.o.   MRN: [id number]  HPI Pt here for follow up and management of chronic medical problems which includes hypothyroid and hypertension. She is taking medication regularly.  Patient has several complaints today.  Hip and knee pain.  She is going to see the orthopedist soon.  She also complains of pain in the right thumb with weakness of the right thumb.  She has ongoing fatigue and general weakness.  She is getting ready to start BiPAP.  She also has edema.  She has had her pelvic exam and mammogram.  She is seeing the cardiologist.  The patient has an appointment with orthopedic surgeon this week regarding her hip and knee pain which have become very severe.  She also complains of some discomfort in her chest after she has been sitting for a while and gets up and starts walking.  With when she saw the rheumatologist he thought she may have fibromyalgia and this is certainly sounding more like fibromyalgia.  She denies any chest pain other than this discomfort after sitting and getting up and starting to walk.  She denies any shortness of breath.  She denies any trouble with her stomach other than alternating loose bowel movements with constipation which she has had for a while.  She denies any blood in the stool or black tarry bowel movements.  She does not take a probiotic.  She is having some slight burning with voiding.  The patient has seen the cardiologist and has been cleared by the cardiologist for any cardiovascular issues.  She was placed on some thyroid medicine several months ago because of a slightly increased TSH but has stopped this after about a month.  The most recent thyroid tests were normal.  She has an appointment for some sleep studies coming up soon.  She did see the rheumatologist and he does not plan to see her back.  She is complaining of the tenderness at the base of the right thumb.     Patient  Active Problem List   Diagnosis Date Noted  . Spinal stenosis of lumbar region 03/08/2017  . Fatigue 02/15/2017  . Palpitations 01/16/2017  . Fundic gland polyps of stomach, benign 07/10/2015  . Family history of colon cancer in mother - dx 79's 07/04/2015  . Overactive bladder 12/29/2014  . Chest pain 10/22/2013  . Metabolic syndrome 08/21/3233  . Severe obesity (BMI >= 40) ([place]) 05/07/2013  . Essential hypertension, benign 11/03/2012  . Vitamin D deficiency 11/03/2012  . Knee pain   . Endometrial hyperplasia   . Hemorrhoid   . Foot pain   . Esophageal stricture   . Diverticulosis   . Hip pain   . Gastritis   . Tendonitis of ankle   . Hiatal hernia   . Hyperlipidemia   . Asthma   . GERD 10/03/2009   Outpatient Encounter Medications as of 07/15/2017  Medication Sig  . aspirin 81 MG tablet Take 81 mg by mouth daily.  . busPIRone (BUSPAR) 15 MG tablet Take 1 tablet (15 mg total) by mouth 3 (three) times daily as needed.  . busPIRone (BUSPAR) 15 MG tablet TAKE 1 TABLET BY MOUTH THREE TIMES DAILY AS NEEDED  . Cholecalciferol (VITAMIN D3) 3000 UNITS TABS Take 1 capsule by mouth daily.  Marland Kitchen FeFum-FePoly-FA-B Cmp-C-Biot (INTEGRA PLUS) CAPS Take 1 capsule by mouth daily.  . fluconazole (DIFLUCAN) 150 MG tablet Take 1 tablet (  150 mg total) by mouth daily.  Marland Kitchen levothyroxine (SYNTHROID, LEVOTHROID) 25 MCG tablet Take 1 tablet (25 mcg total) by mouth daily before breakfast.  . losartan-hydrochlorothiazide (HYZAAR) 100-12.5 MG tablet TAKE 1 TABLET BY MOUTH  DAILY  . naproxen (NAPROSYN) 500 MG tablet TAKE 1 TABLET BY MOUTH TWO  TIMES DAILY WITH A MEAL  . Omega-3 Fatty Acids ([employer] NATURAL FISH OIL) 1000 MG CAPS Take 2 capsules by mouth daily.  Marland Kitchen oxybutynin (DITROPAN) 5 MG tablet TAKE 1 TABLET BY MOUTH 2-3  TIMES DAILY AS NEEDED  . pantoprazole (PROTONIX) 40 MG tablet TAKE ONE TABLET BY MOUTH  ONCE DAILY BEFORE BREAKFAST   No facility-administered encounter medications on file as of 07/15/2017.        Review of Systems  Constitutional: Positive for fatigue.  HENT: Negative.   Eyes: Negative.   Respiratory: Negative.   Cardiovascular: Negative.   Gastrointestinal: Negative.   Endocrine: Negative.   Genitourinary: Negative.   Musculoskeletal: Positive for arthralgias (right thumb pain / weak  /// hips and knees hurt // upper chest pressure when walking or standing).  Skin: Negative.   Allergic/Immunologic: Negative.   Neurological: Positive for weakness and headaches (low grade HA - last a long time).  Hematological: Negative.   Psychiatric/Behavioral: Negative.        Objective:   Physical Exam  Constitutional: She is oriented to person, place, and time. She appears well-developed and well-nourished. She appears distressed.  She is pleasant and relaxed and upset with feeling bad for so long.  HENT:  Head: Normocephalic and atraumatic.  Right Ear: External ear normal.  Left Ear: External ear normal.  Nose: Nose normal.  Mouth/Throat: Oropharynx is clear and moist. No oropharyngeal exudate.  Eyes: Conjunctivae and EOM are normal. Pupils are equal, round, and reactive to light. Right eye exhibits no discharge. Left eye exhibits no discharge. No scleral icterus.  Neck: Normal range of motion. Neck supple. No thyromegaly present.  No bruits thyromegaly or anterior cervical adenopathy.  Cardiovascular: Normal rate, regular rhythm, normal heart sounds and intact distal pulses.  No murmur heard. Heart is slightly irregular at 72/min  Pulmonary/Chest: Effort normal and breath sounds normal. No respiratory distress. She has no wheezes. She has no rales.  Clear anteriorly and posteriorly  Abdominal: Soft. Bowel sounds are normal. She exhibits no mass. There is no tenderness. There is no rebound and no guarding.  Obesity and slight suprapubic tenderness with no masses or organ enlargement palpable.  Musculoskeletal: She exhibits edema. She exhibits no tenderness.  Hesitant range  of motion tender at the joint lines of both knees with slight swelling in the left lower extremity  Lymphadenopathy:    She has no cervical adenopathy.  Neurological: She is alert and oriented to person, place, and time. No cranial nerve deficit.  Reflexes the right lower extremity slightly decreased  Skin: Skin is warm and dry. No rash noted.  Psychiatric: She has a normal mood and affect. Her behavior is normal. Judgment and thought content normal.  Nursing note and vitals reviewed.   BP 133/74   Pulse 72   Temp (!) 97.1 F (36.2 C) (Oral)   Ht 5' (1.524 m)   Wt 227 lb (103 kg)   BMI 44.33 kg/m        Assessment & Plan:  1. Hypothyroidism, unspecified type -This is only slightly abnormal in the past and the patient has since stopped the medication and has had no recurrent problems with elevated TSH but  we will recheck that again today. - CBC with Differential/Platelet - Thyroid Panel With TSH  2. Vitamin D deficiency -Continue current treatment pending results of lab work - CBC with Differential/Platelet - VITAMIN D 25 Hydroxy (Vit-D Deficiency, Fractures)  3. Essential hypertension, benign -Blood pressure is good today - CBC with Differential/Platelet - BMP8+EGFR - Lipid panel - Hepatic function panel  4. Gastroesophageal reflux disease, esophagitis presence not specified -Toprol is all for reflux  - CBC with Differential/Platelet - Lipid panel  5. Metabolic syndrome -Continue to work aggressively on weight loss with diet and exercise as much as possible - CBC with Differential/Platelet  6. Other fatigue -Recheck thyroid profile - Urinalysis, Complete  7. Spinal stenosis, unspecified spinal region -See orthopedist and discuss knees hips and back and thumb.  8. Morbid obesity due to excess calories ([place]) -Work aggressively on weight loss with diet and exercise  9. Primary osteoarthritis involving multiple joints -Take Tylenol for aches pains and fever and  avoid NSAIDs because of impact on kidney function.  . Patient Instructions                       Medicare Annual Wellness Visit  [facility] and the medical providers at [facility] strive to bring you the best medical care.  In doing so we not only want to address your current medical conditions and concerns but also to detect new conditions early and prevent illness, disease and health-related problems.    Medicare offers a yearly Wellness Visit which allows our clinical staff to assess your need for preventative services including immunizations, lifestyle education, counseling to decrease risk of preventable diseases and screening for fall risk and other medical concerns.    This visit is provided free of charge (no copay) for all Medicare recipients. The clinical pharmacists at [facility] have begun to conduct these Wellness Visits which will also include a thorough review of all your medications.    As you primary medical provider recommend that you make an appointment for your Annual Wellness Visit if you have not done so already this year.  You may set up this appointment before you leave today or you may call back ([phone]) and schedule an appointment.  Please make sure when you call that you mention that you are scheduling your Annual Wellness Visit with the clinical pharmacist so that the appointment may be made for the proper length of time.     Continue current medications. Continue good therapeutic lifestyle changes which include good diet and exercise. Fall precautions discussed with patient. If an FOBT was given today- please return it to our front desk. If you are over 81 years old - you may need Prevnar 82 or the adult Pneumonia vaccine.  **Flu shots are available--- please call and schedule a FLU-CLINIC appointment**  After your visit with [place] today you will receive a survey in the mail or online from [employer] regarding your  care with [place]. Please take a moment to fill this out. Your feedback is very important to [place] as you can help [place] better understand your patient needs as well as improve your experience and satisfaction. WE CARE ABOUT YOU!!!   Try a probiotic like align and get the generic version Follow-up with orthopedic surgeon as planned Get sleep evaluation as planned    Arrie Senate MD

## 2017-07-16 ENCOUNTER — Other Ambulatory Visit: Payer: Self-pay | Admitting: *Deleted

## 2017-07-16 ENCOUNTER — Telehealth: Payer: Self-pay | Admitting: Family Medicine

## 2017-07-16 LAB — THYROID PANEL WITH TSH
FREE THYROXINE INDEX: 1.2 (ref 1.2–4.9)
T3 Uptake Ratio: 23 % — ABNORMAL LOW (ref 24–39)
T4 TOTAL: 5.1 ug/dL (ref 4.5–12.0)
TSH: 4.73 u[IU]/mL — AB (ref 0.450–4.500)

## 2017-07-16 LAB — BMP8+EGFR
BUN/Creatinine Ratio: 25 (ref 12–28)
BUN: 17 mg/dL (ref 8–27)
CHLORIDE: 102 mmol/L (ref 96–106)
CO2: 28 mmol/L (ref 20–29)
CREATININE: 0.68 mg/dL (ref 0.57–1.00)
Calcium: 9.5 mg/dL (ref 8.7–10.3)
GFR calc Af Amer: 105 mL/min/{1.73_m2} (ref >59)
GFR calc non Af Amer: 91 mL/min/{1.73_m2} (ref >59)
GLUCOSE: 84 mg/dL (ref 65–99)
Potassium: 3.8 mmol/L (ref 3.5–5.2)
SODIUM: 144 mmol/L (ref 134–144)

## 2017-07-16 LAB — CBC WITH DIFFERENTIAL/PLATELET
BASOS ABS: 0 10*3/uL (ref 0.0–0.2)
BASOS: 1 %
EOS (ABSOLUTE): 0.4 10*3/uL (ref 0.0–0.4)
Eos: 4 %
HEMOGLOBIN: 13.8 g/dL (ref 11.1–15.9)
Hematocrit: 42.8 % (ref 34.0–46.6)
IMMATURE GRANS (ABS): 0 10*3/uL (ref 0.0–0.1)
IMMATURE GRANULOCYTES: 0 %
LYMPHS: 28 %
Lymphocytes Absolute: 2.5 10*3/uL (ref 0.7–3.1)
MCH: 30.7 pg (ref 26.6–33.0)
MCHC: 32.2 g/dL (ref 31.5–35.7)
MCV: 95 fL (ref 79–97)
Monocytes Absolute: 0.6 10*3/uL (ref 0.1–0.9)
Monocytes: 7 %
NEUTROS PCT: 60 %
Neutrophils Absolute: 5.3 10*3/uL (ref 1.4–7.0)
PLATELETS: 257 10*3/uL (ref 150–379)
RBC: 4.49 x10E6/uL (ref 3.77–5.28)
RDW: 13.7 % (ref 12.3–15.4)
WBC: 8.7 10*3/uL (ref 3.4–10.8)

## 2017-07-16 LAB — LIPID PANEL
CHOLESTEROL TOTAL: 197 mg/dL (ref 100–199)
Chol/HDL Ratio: 3.1 ratio (ref 0.0–4.4)
HDL: 63 mg/dL (ref >39)
LDL CALC: 116 mg/dL — AB (ref 0–99)
TRIGLYCERIDES: 92 mg/dL (ref 0–149)
VLDL CHOLESTEROL CAL: 18 mg/dL (ref 5–40)

## 2017-07-16 LAB — HEPATIC FUNCTION PANEL
ALBUMIN: 4.5 g/dL (ref 3.6–4.8)
ALK PHOS: 84 IU/L (ref 39–117)
ALT: 18 IU/L (ref 0–32)
AST: 15 IU/L (ref 0–40)
Bilirubin Total: 0.4 mg/dL (ref 0.0–1.2)
Bilirubin, Direct: 0.12 mg/dL (ref 0.00–0.40)
TOTAL PROTEIN: 6.9 g/dL (ref 6.0–8.5)

## 2017-07-16 LAB — VITAMIN D 25 HYDROXY (VIT D DEFICIENCY, FRACTURES): Vit D, 25-Hydroxy: 36.1 ng/mL (ref 30.0–100.0)

## 2017-07-16 MED ORDER — LEVOTHYROXINE SODIUM 25 MCG PO TABS: 25.0000 ug | ORAL_TABLET | Freq: Every day | ORAL | 1 refills | Status: DC

## 2017-07-16 NOTE — Telephone Encounter (Signed)
Pt called about labs  

## 2017-07-19 DIAGNOSIS — M1711 Unilateral primary osteoarthritis, right knee: Secondary | ICD-10-CM | POA: Diagnosis not present

## 2017-07-26 ENCOUNTER — Telehealth: Payer: Self-pay | Admitting: *Deleted

## 2017-07-26 NOTE — Telephone Encounter (Signed)
   [facility] Pre-operative Risk Assessment    Request for surgical clearance:  1. What type of surgery is being performed? 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 When is this surgery scheduled? 10/07/17   3. Are there any medications that need to be held prior to surgery and how long?   4. Practice name and name of physician performing surgery?  Ortho-Dr. Maureen Ralphs   5. What is your office phone and fax number? [phone]   6. Anesthesia type (None, local, MAC, general) ?    Stacie Russell 07/26/2017, 10:49 AM  _________________________________________________________________   (provider comments below)

## 2017-07-26 NOTE — Telephone Encounter (Signed)
Pt has no CAD but should be seen back in [place] or early Feb by Dr. Gwenlyn Found or Dr. Claiborne Billings prior to surgery 10/07/16.

## 2017-07-26 NOTE — Telephone Encounter (Signed)
Led pt to lt her know she needed to see Dr. Gwenlyn Found before we could clear her for sx. She has been scheduled 08/27/17 @ 11:00. Pt thanked me for the call.

## 2017-08-12 DIAGNOSIS — R0902 Hypoxemia: Secondary | ICD-10-CM | POA: Insufficient documentation

## 2017-08-12 DIAGNOSIS — I10 Essential (primary) hypertension: Secondary | ICD-10-CM | POA: Insufficient documentation

## 2017-08-15 ENCOUNTER — Ambulatory Visit (HOSPITAL_BASED_OUTPATIENT_CLINIC_OR_DEPARTMENT_OTHER): Payer: Medicare Other | Attending: Cardiovascular Disease | Admitting: Cardiovascular Disease

## 2017-08-15 VITALS — Ht 62.0 in | Wt 230.0 lb

## 2017-08-15 DIAGNOSIS — G4733 Obstructive sleep apnea (adult) (pediatric): Secondary | ICD-10-CM | POA: Diagnosis not present

## 2017-08-22 DIAGNOSIS — M79641 Pain in right hand: Secondary | ICD-10-CM | POA: Diagnosis not present

## 2017-08-22 DIAGNOSIS — M13841 Other specified arthritis, right hand: Secondary | ICD-10-CM | POA: Diagnosis not present

## 2017-08-27 ENCOUNTER — Ambulatory Visit: Payer: Medicare Other | Admitting: Cardiovascular Disease

## 2017-08-27 ENCOUNTER — Encounter: Payer: Self-pay | Admitting: Cardiovascular Disease

## 2017-08-27 DIAGNOSIS — E7800 Pure hypercholesterolemia, unspecified: Secondary | ICD-10-CM | POA: Diagnosis not present

## 2017-08-27 DIAGNOSIS — I10 Essential (primary) hypertension: Secondary | ICD-10-CM | POA: Diagnosis not present

## 2017-08-27 DIAGNOSIS — G4733 Obstructive sleep apnea (adult) (pediatric): Secondary | ICD-10-CM | POA: Diagnosis not present

## 2017-08-27 HISTORY — DX: Obstructive sleep apnea (adult) (pediatric): G47.33

## 2017-08-27 NOTE — Patient Instructions (Addendum)
Medication Instructions: Your physician recommends that you continue on your current medications as directed. Please refer to the Current Medication list given to you today.   Follow-Up: Your physician wants you to follow-up in: 1 year with Dr. Gwenlyn Found. You will receive a reminder letter in the mail two months in advance. If you don't receive a letter, please call our office to schedule the follow-up appointment.  Your physician recommends that you schedule a follow-up appointment in: 1 month with Dr. Claiborne Billings.   If you need a refill on your cardiac medications before your next appointment, please call your pharmacy.   Other:  You have been cleared for surgery. I have faxed your clearance letter to Dr. Anne Fu office.

## 2017-08-27 NOTE — Progress Notes (Signed)
08/27/2017 [name]   [birth year]  [id number]  Primary Physician Chipper Herb, MD Primary Cardiologist: Lorretta Harp MD Stacie Russell, [place]  HPI:  Stacie Russell is a 67 y.o.  married Caucasian female, mother of 2, who is  accompanied by her husband Stacie Russell today. Her sister was Stacie Russell, also a patient of mine, who recently passed away. She was referred to me by Dr. Laurance Flatten for cardiovascular evaluation because of excessive fatigue and palpitations. I last saw her in the office 01/16/17. Her only risk factor for heart disease is treated hypertension and family history. She's never had a heart attack or stroke. She denies chest pain or shortness of breath. Her major complaint is of severe fatigue worse over the last several months but present over the last 5-6 years. She does have GERD. I performed Myoview stress testing and 2-D echocardiography all of which were unrevealing. A sleep study did show obstructive sleep apnea and she has undergone CPAP titration. She also feels somewhat quickly improved. She is anticipating needing a total knee replacement by Dr. Maureen Ralphs near future which she will be cleared for at low risk given her favorable noninvasive studies.     Current Meds  Medication Sig  . aspirin 81 MG tablet Take 81 mg by mouth daily.  . busPIRone (BUSPAR) 15 MG tablet Take 1 tablet (15 mg total) by mouth 3 (three) times daily as needed.  . Cholecalciferol (VITAMIN D3) 3000 UNITS TABS Take 1 capsule by mouth daily.  . furosemide (LASIX) 20 MG tablet Take 1 tablet (20 mg total) by mouth daily as needed.  Marland Kitchen levothyroxine (SYNTHROID, LEVOTHROID) 25 MCG tablet Take 1 tablet (25 mcg total) by mouth daily before breakfast.  . losartan-hydrochlorothiazide (HYZAAR) 100-12.5 MG tablet TAKE 1 TABLET BY MOUTH  DAILY  . Omega-3 Fatty Acids ([employer] NATURAL FISH OIL) 1000 MG CAPS Take 2 capsules by mouth daily.  Marland Kitchen oxybutynin (DITROPAN) 5 MG tablet TAKE 1 TABLET BY MOUTH  2-3  TIMES DAILY AS NEEDED  . pantoprazole (PROTONIX) 40 MG tablet TAKE ONE TABLET BY MOUTH  ONCE DAILY BEFORE BREAKFAST     No Known Allergies  Social History   Socioeconomic History  . Marital status: Married    Spouse name: Stacie Russell  . Number of children: Not on file  . Years of education: Not on file  . Highest education level: Not on file  Social Needs  . Financial resource strain: Not on file  . Food insecurity - worry: Not on file  . Food insecurity - inability: Not on file  . Transportation needs - medical: Not on file  . Transportation needs - non-medical: Not on file  Occupational History  . Occupation: Retired  Tobacco Use  . Smoking status: Never Smoker  . Smokeless tobacco: Never Used  Substance and Sexual Activity  . Alcohol use: No  . Drug use: No  . Sexual activity: Not on file  Other Topics Concern  . Not on file  Social History Narrative  . Not on file     Review of Systems: General: negative for chills, fever, night sweats or weight changes.  Cardiovascular: negative for chest pain, dyspnea on exertion, edema, orthopnea, palpitations, paroxysmal nocturnal dyspnea or shortness of breath Dermatological: negative for rash Respiratory: negative for cough or wheezing Urologic: negative for hematuria Abdominal: negative for nausea, vomiting, diarrhea, bright red blood per rectum, melena, or hematemesis Neurologic: negative for visual changes, syncope, or dizziness All other  systems reviewed and are otherwise negative except as noted above.    Blood pressure (!) 104/54, pulse 74, height 5' (1.524 m), weight 231 lb 3.2 oz (104.9 kg).  General appearance: alert and no distress Neck: no adenopathy, no carotid bruit, no JVD, supple, symmetrical, trachea midline and thyroid not enlarged, symmetric, no tenderness/mass/nodules Lungs: clear to auscultation bilaterally Heart: regular rate and rhythm, S1, S2 normal, no murmur, click, rub or gallop Extremities:  extremities normal, atraumatic, no cyanosis or edema Pulses: 2+ and symmetric Skin: Skin color, texture, turgor normal. No rashes or lesions Neurologic: Alert and oriented X 3, normal strength and tone. Normal symmetric reflexes. Normal coordination and gait  EKG not performed today  ASSESSMENT AND PLAN:   Hyperlipidemia History of hyperlipidemia not on statin therapy followed by her PCP. Her left lipid profile performed 07/15/17 revealed LDL of 116 and HDL of 63.  Essential hypertension, benign History of essential hypertension with blood pressure measured today at 104/54. She is on losartan and hydrochlorothiazide. Continue current meds at current dosing.  Obstructive sleep apnea History of a positive sleep study performed 04/17/17 read by Dr. Claiborne Billings. She has undergone C-peptide titration. We will schedule her to see Dr. Claiborne Billings for discussion of the test results.      Lorretta Harp MD FACP,FACC,FAHA, [facility] 08/27/2017 1:49 PM

## 2017-08-27 NOTE — Assessment & Plan Note (Addendum)
History of hyperlipidemia not on statin therapy followed by her PCP. Her left lipid profile performed 07/15/17 revealed LDL of 116 and HDL of 63.

## 2017-08-27 NOTE — Assessment & Plan Note (Signed)
History of essential hypertension with blood pressure measured today at 104/54. She is on losartan and hydrochlorothiazide. Continue current meds at current dosing.

## 2017-08-27 NOTE — Assessment & Plan Note (Signed)
History of a positive sleep study performed 04/17/17 read by Dr. Claiborne Billings. She has undergone C-peptide titration. We will schedule her to see Dr. Claiborne Billings for discussion of the test results.

## 2017-08-30 ENCOUNTER — Ambulatory Visit: Payer: Self-pay | Admitting: Orthopedic Surgery

## 2017-09-06 ENCOUNTER — Encounter (HOSPITAL_BASED_OUTPATIENT_CLINIC_OR_DEPARTMENT_OTHER): Payer: Self-pay | Admitting: Cardiovascular Disease

## 2017-09-06 NOTE — Procedures (Signed)
Patient Name: Stacie Russell, Stacie Russell Date: 08/15/2017 Gender: Female D.O.B: 1950-12-11 Age (years): 47 Referring Provider: Shelva Majestic MD, ABSM Height (inches): 60 Interpreting Physician: Shelva Majestic MD, ABSM Weight (lbs): 230 RPSGT: Jorge Ny BMI: 45 MRN: [id number] Neck Size: 15.00  CLINICAL INFORMATION The patient is referred for a BiPAP titration to treat sleep apnea.  Date of NPSG: 04/18/2007: AHI 15.7/h; RDI 27.8/h; REM AHI 40.0/h                         CPAP Titration: 06/17/2017: Titrated up to 14 cwp but continued to have significant central events; BiPAP titration recommended.  SLEEP STUDY TECHNIQUE As per the AASM Manual for the Scoring of Sleep and Associated Events v2.3 (April 2016) with a hypopnea requiring 4% desaturations.  The channels recorded and monitored were frontal, central and occipital EEG, electrooculogram (EOG), submentalis EMG (chin), nasal and oral airflow, thoracic and abdominal wall motion, anterior tibialis EMG, snore microphone, electrocardiogram, and pulse oximetry. Bilevel positive airway pressure (BPAP) was initiated at the beginning of the study and titrated to treat sleep-disordered breathing.  MEDICATIONS     aspirin 81 MG tablet             busPIRone (BUSPAR) 15 MG tablet         Cholecalciferol (VITAMIN D3) 3000 UNITS TABS         furosemide (LASIX) 20 MG tablet         levothyroxine (SYNTHROID, LEVOTHROID) 25 MCG tablet         losartan-hydrochlorothiazide (HYZAAR) 100-12.5 MG tablet         Omega-3 Fatty Acids ([employer] NATURAL FISH OIL) 1000 MG CAPS         oxybutynin (DITROPAN) 5 MG tablet         pantoprazole (PROTONIX) 40 MG tablet      Medications self-administered by patient taken the night of the study : OXYBUTYNIN CHLORIDE  RESPIRATORY PARAMETERS Optimal IPAP Pressure (cm):  AHI at Optimal Pressure (/hr) N/A Optimal EPAP Pressure (cm):   Overall Minimal O2 (%): 88.00 Minimal O2 at Optimal Pressure  (%): 78.00  SLEEP ARCHITECTURE Start Time: 11:10:45 PM Stop Time: 5:53:13 AM Total Time (min): 402.5 Total Sleep Time (min): 156.5 Sleep Latency (min): 11.7 Sleep Efficiency (%): 38.9 REM Latency (min): 250.0 WASO (min): 234.3 Stage N1 (%): 17.25 Stage N2 (%): 38.02 Stage N3 (%): 22.36 Stage R (%): 22.36 Supine (%): 23.28 Arousal Index (/hr): 37.2   CARDIAC DATA The 2 lead EKG demonstrated sinus rhythm. The mean heart rate was 71.93 beats per minute. Other EKG findings include: PVCs.  LEG MOVEMENT DATA The total Periodic Limb Movements of Sleep (PLMS) were 0. The PLMS index was 0.00. A PLMS index of <15 is considered normal in adults.  IMPRESSIONS - BiPAP was initiated 8/4 and was titrated up to 13/9.  Central events were consistently present commencing at 11/7.  At 13/9 there were 5 obstructive apneas, 7 central apneas, 1 mixed apnea, and 1 RERA;  AHI was 93.  - Mild Central Sleep Apnea was noted during this titration (CAI = 6.5/h). - Significant oxygen desaturations were observed during this titration to a nadir of 88% at 8/4. - The patient snored with moderate snoring volume. - 2-lead EKG demonstrated: PVCs - Clinically significant periodic limb movements were not noted during this study. Arousals associated with PLMs were rare.  DIAGNOSIS - Obstructive Sleep Apnea (327.23 [G47.33 ICD-10]) -Central Apnea  RECOMMENDATIONS - In  this patient with documented normal LV function and continued presence of central apneic events despite BiPAP therapy, recommend ASV titration for optimal management. - Efforts should be made to optimize nasal and oral pharyngeal patency and optimal volume status. - Avoid alcohol, sedatives and other CNS depressants that may worsen sleep apnea and disrupt normal sleep architecture. - Sleep hygiene should be reviewed to assess factors that may improve sleep quality. - Weight management and regular exercise should be initiated or continued.  [Electronically  signed] 09/06/2017 04:08 PM  Shelva Majestic MD, [facility], ABSM Diplomate, American Board of Sleep Medicine   NPI: [id number]  [place] PH: [phone]   FX: [phone] [place]

## 2017-09-17 ENCOUNTER — Other Ambulatory Visit: Payer: Self-pay | Admitting: Family Medicine

## 2017-09-18 ENCOUNTER — Telehealth: Payer: Self-pay | Admitting: *Deleted

## 2017-09-18 ENCOUNTER — Other Ambulatory Visit: Payer: Self-pay | Admitting: Cardiovascular Disease

## 2017-09-18 DIAGNOSIS — G4730 Sleep apnea, unspecified: Secondary | ICD-10-CM

## 2017-09-18 DIAGNOSIS — G4733 Obstructive sleep apnea (adult) (pediatric): Secondary | ICD-10-CM

## 2017-09-18 DIAGNOSIS — G4731 Primary central sleep apnea: Secondary | ICD-10-CM

## 2017-09-18 NOTE — Telephone Encounter (Signed)
Pt returned this office call pt will not be home or reachable in the next half hr.   Please give her a call back.

## 2017-09-18 NOTE — Telephone Encounter (Signed)
-----   Message from Troy Sine, MD sent at 09/06/2017  4:18 PM EST ----- Lura Falor:  Please notify the patient that on her BiPAP titration she continued to have central events.  As result, we will schedule the patient for an ASV titration ASAP.

## 2017-09-18 NOTE — Telephone Encounter (Signed)
Returned the call to the patient. She stated that she no longer wished to be seen by Dr. Claiborne Billings and that she would find another sleep provider. She was asked if there was anything we could do for her but she declined. She asked for her appointment to be cancelled. Message will be routed to Dr. Claiborne Billings and his nurse.

## 2017-09-18 NOTE — Telephone Encounter (Signed)
Called patient and left message to call back.   Order placed for ASV titration and message sent to precert.

## 2017-09-18 NOTE — Telephone Encounter (Signed)
Called and left message with patient's husband to have her to return a call to me. ( ASV titration study)

## 2017-09-18 NOTE — Telephone Encounter (Signed)
Patient scheduled for ASV titration study 09/26/17 @ [facility]. Per kia patient's insurance doesn't need a precert. Left message with patient's husband to return a call so thatg I can advise her of the appointment.

## 2017-09-18 NOTE — Telephone Encounter (Signed)
-----   Message from Silverio Lay, RN sent at 09/18/2017 11:57 AM EST ----- Regarding: RE: ASV titration Yeah, I sent to precert but just copied you on it for tracking purposes.    They wont schedule until its precerted.   ----- Message ----- From: Lauralee Evener, CMA Sent: 09/18/2017  11:44 AM To: Silverio Lay, RN Subject: RE: ASV titration                              I can schedule but haven't learned to precert yet so that has to go to whoever has been doing it. ----- Message ----- From: Silverio Lay, RN Sent: 09/18/2017  11:34 AM To: Lauralee Evener, CMA, Silverio Lay, RN, # Subject: ASV titration                                  Please precert ASV titration and scheduled ASAP.  Thanks, [employer] RN

## 2017-09-19 NOTE — Progress Notes (Signed)
See telephone note 2/6

## 2017-09-20 ENCOUNTER — Other Ambulatory Visit: Payer: Self-pay | Admitting: Podiatry

## 2017-09-20 ENCOUNTER — Encounter: Payer: Self-pay | Admitting: Podiatry

## 2017-09-20 ENCOUNTER — Ambulatory Visit: Payer: Medicare Other | Admitting: Podiatry

## 2017-09-20 ENCOUNTER — Ambulatory Visit (INDEPENDENT_AMBULATORY_CARE_PROVIDER_SITE_OTHER): Payer: Medicare Other

## 2017-09-20 DIAGNOSIS — B351 Tinea unguium: Secondary | ICD-10-CM

## 2017-09-20 DIAGNOSIS — M79674 Pain in right toe(s): Secondary | ICD-10-CM | POA: Diagnosis not present

## 2017-09-20 DIAGNOSIS — M79675 Pain in left toe(s): Secondary | ICD-10-CM | POA: Diagnosis not present

## 2017-09-20 DIAGNOSIS — M216X9 Other acquired deformities of unspecified foot: Secondary | ICD-10-CM

## 2017-09-20 DIAGNOSIS — L84 Corns and callosities: Secondary | ICD-10-CM | POA: Diagnosis not present

## 2017-09-20 DIAGNOSIS — M79672 Pain in left foot: Secondary | ICD-10-CM

## 2017-09-20 NOTE — Progress Notes (Signed)
Subjective:   Patient ID: Stacie Russell, female   DOB: 67 y.o.   MRN: [id number]   HPI Patient presents with significant lesion plantar aspect left foot admitting she is had a cut in the past and she also has ingrown toenails which become painful on both feet with thickness of the beds and she cannot cut them herself and she is due for knee replacement the end of February.  Patient does have orthotics which have worn out and patient does not smoke and likes to be active   Review of Systems  All other systems reviewed and are negative.       Objective:  Physical Exam  Constitutional: She appears well-developed and well-nourished.  Cardiovascular: Intact distal pulses.  Pulmonary/Chest: Effort normal.  Musculoskeletal: Normal range of motion.  Neurological: She is alert.  Skin: Skin is warm.  Nursing note and vitals reviewed.   Neurovascular status was found to be intact muscle strength was adequate range of motion within normal limits with patient found to have severe discomfort sub-third metatarsal left with keratotic lesion formation is very painful and prominence of the bone with patient also noted to have incurvated nailbeds 1-5 both feet that gets sore in the corners and also is due for knee replacement right knee.     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  Discussed orthotics which I think would be best will make her a soft orthotic but we are to wait till after she several months into having had the knee replacement  X-ray indicates the lesion is directly on the third metatarsal left with pain noted upon palpation

## 2017-09-26 ENCOUNTER — Encounter (HOSPITAL_BASED_OUTPATIENT_CLINIC_OR_DEPARTMENT_OTHER): Payer: Medicare Other

## 2017-09-27 ENCOUNTER — Ambulatory Visit: Payer: Self-pay | Admitting: Orthopedic Surgery

## 2017-09-27 NOTE — H&P (Signed)
GISELA, LEA [phone], F)  DOB [birth year]  Chief Complaint Right Knee Pain H&P Right Total Knee for 10/07/2017  Patient's Care Team Primary Care Provider: Josie Saunders [facility] PRAC: [place], [place], [place] [address], Ph [phone], Fax [phone] NPI: [id number] Patient's Pharmacies [facility] [id number] [facility]): [place], [place] [address], Ph [phone], Fax [phone]  Vitals Ht: 5 ft 1 in  Wt: 225 lbs  BMI: 42.5 BP - 142/78 Pulse - 76  Allergies Reviewed Allergies NKDA   Medications Reviewed Medications aspirin 81 mg tablet Take 1 tablet(s) every day by oral route. 09/17/17   entered Pieter Fooks, PA-C Fish Oil 09/17/17   entered Derricka Mertz, PA-C furosemide 20 mg tablet 07/15/17   filled [employer] levothyroxine 25 mcg tablet 07/16/17   filled [employer] losartan 100 mg-hydrochlorothiazide 12.5 mg tablet 04/22/17   filled [employer] oxybutynin chloride 5 mg tablet 04/22/17   filled [employer] pantoprazole 40 mg tablet,delayed release 06/04/17   filled [employer] Vitamin D3 09/17/17   entered [place], PA-C     Family History Reviewed Family History Father - Father deceased (died age: 35)   - Myocardial infarction Mother - Mother deceased (died age: 93)   - Family history of cancer Unspecified Relation - Family history of cancer - Sibling   Social History Reviewed Social History Smoking Status: Never smoker Non-smoker Alcohol intake: None Hand Dominance: Right Work related injury?: N Advance directive: Y Freight forwarder of Attorney: Y   Surgical History Reviewed Surgical History Umbilical hernioplasty - 1995  Patient indicated no previous surgeries     Past Medical History Reviewed Past Medical History Asthma: Y Previous Cortisone Injection(s): Y Notes: Hiatal Hernia,  Reflux,  Cataract,  Kidney Stones,   Memopause    HPI Patient is scheduled for a right total knee arthroplasty by Dr. Wynelle Link on 10/07/2017 at [facility]. The patient is a 67 year old female who is being followed for their right knee pain and osteoarthritis. They are now month(s) out from s/p last injection. Symptoms reported include: pain, swelling, stiffness and pain with weightbearing. The patient feels that they are doing poorly and report their pain level to be moderate to severe. Current treatment includes: pain medications. The following medication has been used for pain control: Tylenol. The patient has not gotten any relief of their symptoms with Cortisone injections. She has had x-rays done recently by her PCP.  Her knee is getting progressively worse to the point where it is hurting at all times. It is limiting what she can and cannot do. The injections help but for only a very short amount of time. She cannot do what she desires. She says she has a lot of generalized pain throughout multiple areas. She feels like the knee however is the worst of all of her pains. She is ready to get the knee fixed. Radiographs AP and lateral of the RIGHT knee show bone-on-bone arthritis in the medial and patellofemoral compartments. She has worsening pain and dysfunction in the RIGHT knee. She has failed nonoperative management. Cortisone provides only slight benefit. The most predictable means of improving pain and function is total knee arthroplasty. We discussed the procedure risks and potential complications and rehab course in detail and the patient would like to go ahead and proceed with total knee arthroplasty.   ROS General: None Cardiac: None Respiratory: None Gastrointestinal: Gastrointestinal: heartburn. Genitourinary: Genitourinary: urinary frequency, incontinence. Musculoskeletal: Musculoskeletal: Joint Pain; decreased range of motion.  Physical Exam Patient is a 67 year old female.  General Mental Status - Alert,  cooperative and good historian. General Appearance - pleasant, Not in acute distress. Orientation - Oriented X3. Build & Nutrition - Well nourished and Well developed.  Head and Neck Head - normocephalic, atraumatic . Neck Global Assessment - supple, no bruit auscultated on the right, no bruit auscultated on the left.  Eye Pupil - Bilateral - PERR Motion - Bilateral - EOMI.  Chest and Lung Exam Auscultation Breath sounds - clear at anterior chest wall and clear at posterior chest wall. Adventitious sounds - No Adventitious sounds.  Cardiovascular Auscultation Rhythm - Regular rate and rhythm. Heart Sounds - S1 WNL and S2 WNL. Murmurs & Other Heart Sounds - Auscultation of the heart reveals - No Murmurs.  Abdomen Palpation/Percussion Tenderness - Abdomen is non-tender to palpation. Abdomen is soft. Auscultation Auscultation of the abdomen reveals - Bowel sounds normal.  Genitourinary Note: Not done, not pertinent to present illness  Musculoskeletal Her RIGHT knee shows no effusion. She has a varus deformity. Her range of motion is 5-120. There is moderate crepitus on range of motion of the knee. She is tender medial greater than lateral with no instability noted. Pulses sensation and motor are intact. Gait pattern is antalgic on the RIGHT. Her LEFT knee exam is normal.  Radiographs AP and lateral of the RIGHT knee show bone-on-bone arthritis in the medial and patellofemoral compartments  Assessment / Plan  1. Osteoarthritis of right knee joint M17.11: Unilateral primary osteoarthritis, right knee  Patient Instructions Surgical Plans: Right Total Knee Replacement Disposition: Home with HHPT PCP: Dr. Laurance Flatten Cards: Dr. Gwenlyn Found IV TXA Anesthesia Issues: Headaches following previous cataract surgery Patient was instructed on what medications to stop prior to surgery. - Follow up visit in 2 weeks with Dr. Wynelle Link - Begin physical therapy following surgery - Pre-operative  lab work as pre Pre-Surgical Testing - Prescriptions will be provided in hospital at time of discharge  Encounter signed-off by Mickel Crow, PA-C

## 2017-09-27 NOTE — H&P (View-Only) (Signed)
LALANIA, HASEMAN [phone], F)  DOB 06/19/1951  Chief Complaint Right Knee Pain H&P Right Total Knee for 10/07/2017  Patient's Care Team Primary Care Provider: Josie Saunders [facility] PRAC: [place], [place], [place] [address], Ph [phone], Fax [phone] NPI: [id number] Patient's Pharmacies [place] [address] [facility]): [place], [place] [address], Ph [phone], Fax [phone]  Vitals Ht: 5 ft 1 in  Wt: 225 lbs  BMI: 42.5 BP - 142/78 Pulse - 76  Allergies Reviewed Allergies NKDA   Medications Reviewed Medications aspirin 81 mg tablet Take 1 tablet(s) every day by oral route. 09/17/17   entered Torrez Renfroe, PA-C Fish Oil 09/17/17   entered Woodroe Vogan, PA-C furosemide 20 mg tablet 07/15/17   filled [employer] levothyroxine 25 mcg tablet 07/16/17   filled [employer] losartan 100 mg-hydrochlorothiazide 12.5 mg tablet 04/22/17   filled [employer] oxybutynin chloride 5 mg tablet 04/22/17   filled [employer] pantoprazole 40 mg tablet,delayed release 06/04/17   filled [employer] Vitamin D3 09/17/17   entered [place], PA-C     Family History Reviewed Family History Father - Father deceased (died age: 63)   - Myocardial infarction Mother - Mother deceased (died age: 47)   - Family history of cancer Unspecified Relation - Family history of cancer - Sibling   Social History Reviewed Social History Smoking Status: Never smoker Non-smoker Alcohol intake: None Hand Dominance: Right Work related injury?: N Advance directive: Y Freight forwarder of Attorney: Y   Surgical History Reviewed Surgical History Umbilical hernioplasty - 1995  Patient indicated no previous surgeries     Past Medical History Reviewed Past Medical History Asthma: Y Previous Cortisone Injection(s): Y Notes: Hiatal Hernia,  Reflux,  Cataract,  Kidney Stones,   Memopause    HPI Patient is scheduled for a right total knee arthroplasty by Dr. Wynelle Link on 10/07/2017 at [facility]. The patient is a 67 year old female who is being followed for their right knee pain and osteoarthritis. They are now month(s) out from s/p last injection. Symptoms reported include: pain, swelling, stiffness and pain with weightbearing. The patient feels that they are doing poorly and report their pain level to be moderate to severe. Current treatment includes: pain medications. The following medication has been used for pain control: Tylenol. The patient has not gotten any relief of their symptoms with Cortisone injections. She has had x-rays done recently by her PCP.  Her knee is getting progressively worse to the point where it is hurting at all times. It is limiting what she can and cannot do. The injections help but for only a very short amount of time. She cannot do what she desires. She says she has a lot of generalized pain throughout multiple areas. She feels like the knee however is the worst of all of her pains. She is ready to get the knee fixed. Radiographs AP and lateral of the RIGHT knee show bone-on-bone arthritis in the medial and patellofemoral compartments. She has worsening pain and dysfunction in the RIGHT knee. She has failed nonoperative management. Cortisone provides only slight benefit. The most predictable means of improving pain and function is total knee arthroplasty. We discussed the procedure risks and potential complications and rehab course in detail and the patient would like to go ahead and proceed with total knee arthroplasty.   ROS General: None Cardiac: None Respiratory: None Gastrointestinal: Gastrointestinal: heartburn. Genitourinary: Genitourinary: urinary frequency, incontinence. Musculoskeletal: Musculoskeletal: Joint Pain; decreased range of motion.  Physical Exam Patient is a 67 year old female.  General Mental Status - Alert,  cooperative and good historian. General Appearance - pleasant, Not in acute distress. Orientation - Oriented X3. Build & Nutrition - Well nourished and Well developed.  Head and Neck Head - normocephalic, atraumatic . Neck Global Assessment - supple, no bruit auscultated on the right, no bruit auscultated on the left.  Eye Pupil - Bilateral - PERR Motion - Bilateral - EOMI.  Chest and Lung Exam Auscultation Breath sounds - clear at anterior chest wall and clear at posterior chest wall. Adventitious sounds - No Adventitious sounds.  Cardiovascular Auscultation Rhythm - Regular rate and rhythm. Heart Sounds - S1 WNL and S2 WNL. Murmurs & Other Heart Sounds - Auscultation of the heart reveals - No Murmurs.  Abdomen Palpation/Percussion Tenderness - Abdomen is non-tender to palpation. Abdomen is soft. Auscultation Auscultation of the abdomen reveals - Bowel sounds normal.  Genitourinary Note: Not done, not pertinent to present illness  Musculoskeletal Her RIGHT knee shows no effusion. She has a varus deformity. Her range of motion is 5-120. There is moderate crepitus on range of motion of the knee. She is tender medial greater than lateral with no instability noted. Pulses sensation and motor are intact. Gait pattern is antalgic on the RIGHT. Her LEFT knee exam is normal.  Radiographs AP and lateral of the RIGHT knee show bone-on-bone arthritis in the medial and patellofemoral compartments  Assessment / Plan  1. Osteoarthritis of right knee joint M17.11: Unilateral primary osteoarthritis, right knee  Patient Instructions Surgical Plans: Right Total Knee Replacement Disposition: Home with HHPT PCP: Dr. Laurance Flatten Cards: Dr. Gwenlyn Found IV TXA Anesthesia Issues: Headaches following previous cataract surgery Patient was instructed on what medications to stop prior to surgery. - Follow up visit in 2 weeks with Dr. Wynelle Link - Begin physical therapy following surgery - Pre-operative  lab work as pre Pre-Surgical Testing - Prescriptions will be provided in hospital at time of discharge  Encounter signed-off by Mickel Crow, PA-C

## 2017-09-30 NOTE — Progress Notes (Signed)
07/15/2017- Pre-op clearance from Dr. Redge Gainer on chart.  01/24/2017-noted in [place]- EKG, CXR  01/22/2017-noted in Epic-Stress test  01/30/2017- noted in [place]- Echo

## 2017-09-30 NOTE — Patient Instructions (Addendum)
Stacie Russell  09/30/2017   Your procedure is scheduled on: Monday 10/07/2017  Report to [facility] Main  Entrance              Report to admitting at  [phone]  AM   Call this number if you have problems the morning of surgery [phone]    Remember: Do not eat food or drink liquids :After Midnight.     Take these medicines the morning of surgery with A SIP OF WATER: use Albuterol inhaler if needed and bring it with you to the hospital, Levothyroxine (Synthroid), Pantoprazole (Protonix)                                You may not have any metal on your body including hair pins and              piercings  Do not wear jewelry, make-up, lotions, powders or perfumes, deodorant             Do not wear nail polish.  Do not shave  48 hours prior to surgery.              Men may shave face and neck.   Do not bring valuables to the hospital. [place].  Contacts, dentures or bridgework may not be worn into surgery.  Leave suitcase in the car. After surgery it may be brought to your room.                  Please read over the following fact sheets you were given: _____________________________________________________________________             [facility] - Preparing for Surgery Before surgery, you can play an important role.  Because skin is not sterile, your skin needs to be as free of germs as possible.  You can reduce the number of germs on your skin by washing with CHG (chlorahexidine gluconate) soap before surgery.  CHG is an antiseptic cleaner which kills germs and bonds with the skin to continue killing germs even after washing. Please DO NOT use if you have an allergy to CHG or antibacterial soaps.  If your skin becomes reddened/irritated stop using the CHG and inform your nurse when you arrive at Short Stay. Do not shave (including legs and underarms) for at least 48 hours prior to the first CHG shower.   You may shave your face/neck. Please follow these instructions carefully:  1.  Shower with CHG Soap the night before surgery and the  morning of Surgery.  2.  If you choose to wash your hair, wash your hair first as usual with your  normal  shampoo.  3.  After you shampoo, rinse your hair and body thoroughly to remove the  shampoo.                           4.  Use CHG as you would any other liquid soap.  You can apply chg directly  to the skin and wash                       Gently with a scrungie or clean washcloth.  5.  Apply the CHG Soap to your body ONLY FROM THE NECK DOWN.   Do not use on face/ open                           Wound or open sores. Avoid contact with eyes, ears mouth and genitals (private parts).                       Wash face,  Genitals (private parts) with your normal soap.             6.  Wash thoroughly, paying special attention to the area where your surgery  will be performed.  7.  Thoroughly rinse your body with warm water from the neck down.  8.  DO NOT shower/wash with your normal soap after using and rinsing off  the CHG Soap.                9.  Pat yourself dry with a clean towel.            10.  Wear clean pajamas.            11.  Place clean sheets on your bed the night of your first shower and do not  sleep with pets. Day of Surgery : Do not apply any lotions/deodorants the morning of surgery.  Please wear clean clothes to the hospital/surgery center.  FAILURE TO FOLLOW THESE INSTRUCTIONS MAY RESULT IN THE CANCELLATION OF YOUR SURGERY PATIENT SIGNATURE_________________________________  NURSE SIGNATURE__________________________________  ________________________________________________________________________   Adam Phenix  An incentive spirometer is a tool that can help keep your lungs clear and active. This tool measures how well you are filling your lungs with each breath. Taking long deep breaths may help reverse or decrease the chance of  developing breathing (pulmonary) problems (especially infection) following:  A long period of time when you are unable to move or be active. BEFORE THE PROCEDURE   If the spirometer includes an indicator to show your best effort, your nurse or respiratory therapist will set it to a desired goal.  If possible, sit up straight or lean slightly forward. Try not to slouch.  Hold the incentive spirometer in an upright position. INSTRUCTIONS FOR USE  1. Sit on the edge of your bed if possible, or sit up as far as you can in bed or on a chair. 2. Hold the incentive spirometer in an upright position. 3. Breathe out normally. 4. Place the mouthpiece in your mouth and seal your lips tightly around it. 5. Breathe in slowly and as deeply as possible, raising the piston or the ball toward the top of the column. 6. Hold your breath for 3-5 seconds or for as long as possible. Allow the piston or ball to fall to the bottom of the column. 7. Remove the mouthpiece from your mouth and breathe out normally. 8. Rest for a few seconds and repeat Steps 1 through 7 at least 10 times every 1-2 hours when you are awake. Take your time and take a few normal breaths between deep breaths. 9. The spirometer may include an indicator to show your best effort. Use the indicator as a goal to work toward during each repetition. 10. After each set of 10 deep breaths, practice coughing to be sure your lungs are clear. If you have an incision (the cut made at the time of surgery), support your incision when coughing by placing a  pillow or rolled up towels firmly against it. Once you are able to get out of bed, walk around indoors and cough well. You may stop using the incentive spirometer when instructed by your caregiver.  RISKS AND COMPLICATIONS  Take your time so you do not get dizzy or light-headed.  If you are in pain, you may need to take or ask for pain medication before doing incentive spirometry. It is harder to take a  deep breath if you are having pain. AFTER USE  Rest and breathe slowly and easily.  It can be helpful to keep track of a log of your progress. Your caregiver can provide you with a simple table to help with this. If you are using the spirometer at home, follow these instructions: [place] IF:   You are having difficultly using the spirometer.  You have trouble using the spirometer as often as instructed.  Your pain medication is not giving enough relief while using the spirometer.  You develop fever of 100.5 F (38.1 C) or higher. SEEK IMMEDIATE MEDICAL CARE IF:   You cough up bloody sputum that had not been present before.  You develop fever of 102 F (38.9 C) or greater.  You develop worsening pain at or near the incision site. MAKE SURE YOU:   Understand these instructions.  Will watch your condition.  Will get help right away if you are not doing well or get worse. Document Released: 12/10/2006 Document Revised: 10/22/2011 Document Reviewed: 02/10/2007 ExitCare Patient Information 2014 ExitCare, [place].   ________________________________________________________________________  WHAT IS A BLOOD TRANSFUSION? Blood Transfusion Information  A transfusion is the replacement of blood or some of its parts. Blood is made up of multiple cells which provide different functions.  Red blood cells carry oxygen and are used for blood loss replacement.  White blood cells fight against infection.  Platelets control bleeding.  Plasma helps clot blood.  Other blood products are available for specialized needs, such as hemophilia or other clotting disorders. BEFORE THE TRANSFUSION  Who gives blood for transfusions?   Healthy volunteers who are fully evaluated to make sure their blood is safe. This is blood bank blood. Transfusion therapy is the safest it has ever been in the practice of medicine. Before blood is taken from a donor, a complete history is taken to make  sure that person has no history of diseases nor engages in risky social behavior (examples are intravenous drug use or sexual activity with multiple partners). The donor's travel history is screened to minimize risk of transmitting infections, such as malaria. The donated blood is tested for signs of infectious diseases, such as HIV and hepatitis. The blood is then tested to be sure it is compatible with you in order to minimize the chance of a transfusion reaction. If you or a relative donates blood, this is often done in anticipation of surgery and is not appropriate for emergency situations. It takes many days to process the donated blood. RISKS AND COMPLICATIONS Although transfusion therapy is very safe and saves many lives, the main dangers of transfusion include:   Getting an infectious disease.  Developing a transfusion reaction. This is an allergic reaction to something in the blood you were given. Every precaution is taken to prevent this. The decision to have a blood transfusion has been considered carefully by your caregiver before blood is given. Blood is not given unless the benefits outweigh the risks. AFTER THE TRANSFUSION  Right after receiving a blood transfusion, you  will usually feel much better and more energetic. This is especially true if your red blood cells have gotten low (anemic). The transfusion raises the level of the red blood cells which carry oxygen, and this usually causes an energy increase.  The nurse administering the transfusion will monitor you carefully for complications. HOME CARE INSTRUCTIONS  No special instructions are needed after a transfusion. You may find your energy is better. Speak with your caregiver about any limitations on activity for underlying diseases you may have. SEEK MEDICAL CARE IF:   Your condition is not improving after your transfusion.  You develop redness or irritation at the intravenous (IV) site. SEEK IMMEDIATE MEDICAL CARE IF:   Any of the following symptoms occur over the next 12 hours:  Shaking chills.  You have a temperature by mouth above 102 F (38.9 C), not controlled by medicine.  Chest, back, or muscle pain.  People around you feel you are not acting correctly or are confused.  Shortness of breath or difficulty breathing.  Dizziness and fainting.  You get a rash or develop hives.  You have a decrease in urine output.  Your urine turns a dark color or changes to pink, red, or brown. Any of the following symptoms occur over the next 10 days:  You have a temperature by mouth above 102 F (38.9 C), not controlled by medicine.  Shortness of breath.  Weakness after normal activity.  The white part of the eye turns yellow (jaundice).  You have a decrease in the amount of urine or are urinating less often.  Your urine turns a dark color or changes to pink, red, or brown. Document Released: 07/27/2000 Document Revised: 10/22/2011 Document Reviewed: 03/15/2008 [facility] Patient Information 2014 [place], [place].  _______________________________________________________________________

## 2017-10-01 ENCOUNTER — Encounter (HOSPITAL_COMMUNITY)
Admission: RE | Admit: 2017-10-01 | Discharge: 2017-10-01 | Disposition: A | Discharge status: Home / Self Care | Payer: Medicare Other | Source: Ambulatory Visit | Attending: Orthopedic Surgery | Admitting: Orthopedic Surgery

## 2017-10-01 ENCOUNTER — Other Ambulatory Visit: Payer: Self-pay

## 2017-10-01 ENCOUNTER — Encounter (HOSPITAL_COMMUNITY): Payer: Self-pay

## 2017-10-01 ENCOUNTER — Inpatient Hospital Stay (HOSPITAL_COMMUNITY): Admission: RE | Admit: 2017-10-01 | Payer: Medicare Other | Source: Ambulatory Visit

## 2017-10-01 DIAGNOSIS — M1711 Unilateral primary osteoarthritis, right knee: Secondary | ICD-10-CM | POA: Diagnosis not present

## 2017-10-01 DIAGNOSIS — Z01812 Encounter for preprocedural laboratory examination: Secondary | ICD-10-CM | POA: Insufficient documentation

## 2017-10-01 HISTORY — DX: Unspecified osteoarthritis, unspecified site: M19.90

## 2017-10-01 LAB — COMPREHENSIVE METABOLIC PANEL
ALBUMIN: 4.3 g/dL (ref 3.5–5.0)
ALK PHOS: 81 U/L (ref 38–126)
ALT: 18 U/L (ref 14–54)
ANION GAP: 11 (ref 5–15)
AST: 19 U/L (ref 15–41)
BILIRUBIN TOTAL: 0.8 mg/dL (ref 0.3–1.2)
BUN: 19 mg/dL (ref 6–20)
CO2: 27 mmol/L (ref 22–32)
CREATININE: 0.72 mg/dL (ref 0.44–1.00)
Calcium: 9.4 mg/dL (ref 8.9–10.3)
Chloride: 104 mmol/L (ref 101–111)
GFR calc Af Amer: >60 mL/min (ref >60)
GFR calc non Af Amer: >60 mL/min (ref >60)
GLUCOSE: 100 mg/dL — AB (ref 65–99)
Potassium: 3.6 mmol/L (ref 3.5–5.1)
SODIUM: 142 mmol/L (ref 135–145)
TOTAL PROTEIN: 7.4 g/dL (ref 6.5–8.1)

## 2017-10-01 LAB — CBC
HEMATOCRIT: 42.7 % (ref 36.0–46.0)
HEMOGLOBIN: 14.5 g/dL (ref 12.0–15.0)
MCH: 31.7 pg (ref 26.0–34.0)
MCHC: 34 g/dL (ref 30.0–36.0)
MCV: 93.4 fL (ref 78.0–100.0)
Platelets: 231 10*3/uL (ref 150–400)
RBC: 4.57 MIL/uL (ref 3.87–5.11)
RDW: 12.9 % (ref 11.5–15.5)
WBC: 7.9 10*3/uL (ref 4.0–10.5)

## 2017-10-01 LAB — APTT: APTT: 28 s (ref 24–36)

## 2017-10-01 LAB — PROTIME-INR
INR: 0.99
Prothrombin Time: 13 seconds (ref 11.4–15.2)

## 2017-10-01 LAB — SURGICAL PCR SCREEN
MRSA, PCR: NEGATIVE
Staphylococcus aureus: NEGATIVE

## 2017-10-01 LAB — ABO/RH: ABO/RH(D): A POS

## 2017-10-02 ENCOUNTER — Telehealth: Payer: Self-pay | Admitting: *Deleted

## 2017-10-02 MED ORDER — BUDESONIDE-FORMOTEROL FUMARATE 80-4.5 MCG/ACT IN AERO: 2.0000 | INHALATION_SPRAY | Freq: Two times a day (BID) | RESPIRATORY_TRACT | 0 refills | Status: DC

## 2017-10-02 MED ORDER — LOSARTAN POTASSIUM-HCTZ 100-12.5 MG PO TABS: 1.0000 | ORAL_TABLET | Freq: Every day | ORAL | 0 refills | Status: DC

## 2017-10-02 MED ORDER — OXYBUTYNIN CHLORIDE 5 MG PO TABS: 10.0000 mg | ORAL_TABLET | Freq: Two times a day (BID) | ORAL | 0 refills | Status: DC

## 2017-10-02 NOTE — Telephone Encounter (Signed)
Patient has been out of losartan and oxybutynin since last week. Mail order was lost in shipment. She needs enough sent to [facility] to last until Monday. She is scheduled for knee replacement then and her BP needs to be controlled.   Order sent to [facility] for #10 losartan and #28 oxybutynin. Symbicort left up front.   Patient aware.

## 2017-10-06 MED ORDER — TRANEXAMIC ACID 1000 MG/10ML IV SOLN
1000.0000 mg | INTRAVENOUS | Status: AC
  Administered 2017-10-07: 1000 mg via INTRAVENOUS
  Filled 2017-10-06: qty 1100

## 2017-10-06 MED ORDER — BUPIVACAINE LIPOSOME 1.3 % IJ SUSP
20.0000 mL | INTRAMUSCULAR | Status: DC
  Filled 2017-10-06: qty 20

## 2017-10-07 ENCOUNTER — Encounter (HOSPITAL_COMMUNITY): Payer: Self-pay

## 2017-10-07 ENCOUNTER — Encounter (HOSPITAL_COMMUNITY)
Admission: RE | Disposition: A | Discharge status: Home Health | Payer: Self-pay | Source: Ambulatory Visit | Attending: Orthopedic Surgery

## 2017-10-07 ENCOUNTER — Inpatient Hospital Stay (HOSPITAL_COMMUNITY): Payer: Medicare Other | Admitting: Registered Nurse

## 2017-10-07 ENCOUNTER — Other Ambulatory Visit: Payer: Self-pay

## 2017-10-07 ENCOUNTER — Inpatient Hospital Stay (HOSPITAL_COMMUNITY)
Admission: RE | Admit: 2017-10-07 | Discharge: 2017-10-09 | DRG: 470 | Disposition: A | Discharge status: Home Health | Payer: Medicare Other | Source: Ambulatory Visit | Attending: Orthopedic Surgery | Admitting: Orthopedic Surgery

## 2017-10-07 DIAGNOSIS — Z7982 Long term (current) use of aspirin: Secondary | ICD-10-CM | POA: Diagnosis not present

## 2017-10-07 DIAGNOSIS — Z79899 Other long term (current) drug therapy: Secondary | ICD-10-CM

## 2017-10-07 DIAGNOSIS — I10 Essential (primary) hypertension: Secondary | ICD-10-CM | POA: Diagnosis present

## 2017-10-07 DIAGNOSIS — M1710 Unilateral primary osteoarthritis, unspecified knee: Secondary | ICD-10-CM

## 2017-10-07 DIAGNOSIS — J45909 Unspecified asthma, uncomplicated: Secondary | ICD-10-CM | POA: Diagnosis present

## 2017-10-07 DIAGNOSIS — M1711 Unilateral primary osteoarthritis, right knee: Secondary | ICD-10-CM | POA: Diagnosis not present

## 2017-10-07 DIAGNOSIS — K219 Gastro-esophageal reflux disease without esophagitis: Secondary | ICD-10-CM | POA: Diagnosis present

## 2017-10-07 DIAGNOSIS — Z79890 Hormone replacement therapy (postmenopausal): Secondary | ICD-10-CM

## 2017-10-07 DIAGNOSIS — G8918 Other acute postprocedural pain: Secondary | ICD-10-CM | POA: Diagnosis not present

## 2017-10-07 DIAGNOSIS — E785 Hyperlipidemia, unspecified: Secondary | ICD-10-CM | POA: Diagnosis present

## 2017-10-07 DIAGNOSIS — Z6841 Body Mass Index (BMI) 40.0 and over, adult: Secondary | ICD-10-CM | POA: Diagnosis not present

## 2017-10-07 DIAGNOSIS — M179 Osteoarthritis of knee, unspecified: Secondary | ICD-10-CM | POA: Diagnosis present

## 2017-10-07 HISTORY — PX: TOTAL KNEE ARTHROPLASTY: SHX125

## 2017-10-07 LAB — TYPE AND SCREEN
ABO/RH(D): A POS
Antibody Screen: NEGATIVE

## 2017-10-07 SURGERY — ARTHROPLASTY, KNEE, TOTAL
Anesthesia: Regional | Site: Knee | Laterality: Right

## 2017-10-07 MED ORDER — PANTOPRAZOLE SODIUM 40 MG PO TBEC
40.0000 mg | DELAYED_RELEASE_TABLET | Freq: Every day | ORAL | Status: DC
  Administered 2017-10-08 – 2017-10-09 (×2): 40 mg via ORAL
  Filled 2017-10-07 (×2): qty 1

## 2017-10-07 MED ORDER — LOSARTAN POTASSIUM-HCTZ 100-12.5 MG PO TABS: 1.0000 | ORAL_TABLET | Freq: Every day | ORAL | Status: DC

## 2017-10-07 MED ORDER — LACTATED RINGERS IV SOLN
INTRAVENOUS | Status: DC
  Administered 2017-10-07: 08:00:00 via INTRAVENOUS

## 2017-10-07 MED ORDER — METHOCARBAMOL 1000 MG/10ML IJ SOLN
500.0000 mg | Freq: Four times a day (QID) | INTRAMUSCULAR | Status: DC | PRN
  Filled 2017-10-07: qty 5

## 2017-10-07 MED ORDER — LOSARTAN POTASSIUM 50 MG PO TABS
100.0000 mg | ORAL_TABLET | Freq: Every day | ORAL | Status: DC
  Administered 2017-10-07 – 2017-10-09 (×2): 100 mg via ORAL
  Filled 2017-10-07 (×2): qty 2

## 2017-10-07 MED ORDER — ONDANSETRON HCL 4 MG/2ML IJ SOLN
INTRAMUSCULAR | Status: AC
  Filled 2017-10-07: qty 2

## 2017-10-07 MED ORDER — CEFAZOLIN SODIUM-DEXTROSE 2-4 GM/100ML-% IV SOLN
2.0000 g | INTRAVENOUS | Status: AC
  Administered 2017-10-07: 2 g via INTRAVENOUS
  Filled 2017-10-07: qty 100

## 2017-10-07 MED ORDER — PHENOL 1.4 % MT LIQD
1.0000 | OROMUCOSAL | Status: DC | PRN
Start: 2017-10-07 — End: 2017-10-09

## 2017-10-07 MED ORDER — SODIUM CHLORIDE 0.9 % IR SOLN
Status: DC | PRN
  Administered 2017-10-07: 1000 mL

## 2017-10-07 MED ORDER — PROPOFOL 500 MG/50ML IV EMUL
INTRAVENOUS | Status: DC | PRN
  Administered 2017-10-07: 40 ug/kg/min via INTRAVENOUS

## 2017-10-07 MED ORDER — ACETAMINOPHEN 500 MG PO TABS
1000.0000 mg | ORAL_TABLET | Freq: Four times a day (QID) | ORAL | Status: AC
  Administered 2017-10-07 – 2017-10-08 (×4): 1000 mg via ORAL
  Filled 2017-10-07 (×4): qty 2

## 2017-10-07 MED ORDER — BUPIVACAINE LIPOSOME 1.3 % IJ SUSP
INTRAMUSCULAR | Status: DC | PRN
  Administered 2017-10-07: 20 mL

## 2017-10-07 MED ORDER — DEXAMETHASONE SODIUM PHOSPHATE 10 MG/ML IJ SOLN
10.0000 mg | Freq: Once | INTRAMUSCULAR | Status: AC
  Administered 2017-10-07: 10 mg via INTRAVENOUS

## 2017-10-07 MED ORDER — CHLORHEXIDINE GLUCONATE 4 % EX LIQD: 60.0000 mL | Freq: Once | CUTANEOUS | Status: DC

## 2017-10-07 MED ORDER — OXYBUTYNIN CHLORIDE 5 MG PO TABS
ORAL_TABLET | ORAL | Status: AC
  Filled 2017-10-07: qty 2

## 2017-10-07 MED ORDER — CEFAZOLIN SODIUM-DEXTROSE 2-4 GM/100ML-% IV SOLN
INTRAVENOUS | Status: AC
  Filled 2017-10-07: qty 100

## 2017-10-07 MED ORDER — METHOCARBAMOL 500 MG PO TABS
500.0000 mg | ORAL_TABLET | Freq: Four times a day (QID) | ORAL | Status: DC | PRN
  Administered 2017-10-07 – 2017-10-09 (×3): 500 mg via ORAL
  Filled 2017-10-07 (×3): qty 1

## 2017-10-07 MED ORDER — ONDANSETRON HCL 4 MG PO TABS
4.0000 mg | ORAL_TABLET | Freq: Four times a day (QID) | ORAL | Status: DC | PRN
  Administered 2017-10-08 – 2017-10-09 (×3): 4 mg via ORAL
  Filled 2017-10-07 (×3): qty 1

## 2017-10-07 MED ORDER — DIPHENHYDRAMINE HCL 12.5 MG/5ML PO ELIX: 12.5000 mg | ORAL_SOLUTION | ORAL | Status: DC | PRN

## 2017-10-07 MED ORDER — FLEET ENEMA 7-19 GM/118ML RE ENEM: 1.0000 | ENEMA | Freq: Once | RECTAL | Status: DC | PRN

## 2017-10-07 MED ORDER — ACETAMINOPHEN 650 MG RE SUPP: 650.0000 mg | RECTAL | Status: DC | PRN

## 2017-10-07 MED ORDER — MENTHOL 3 MG MT LOZG: 1.0000 | LOZENGE | OROMUCOSAL | Status: DC | PRN

## 2017-10-07 MED ORDER — OXYCODONE HCL 5 MG PO TABS: 5.0000 mg | ORAL_TABLET | Freq: Once | ORAL | Status: DC | PRN

## 2017-10-07 MED ORDER — MIDAZOLAM HCL 2 MG/2ML IJ SOLN
INTRAMUSCULAR | Status: AC
Start: 2017-10-07 — End: 2017-10-07
  Administered 2017-10-07: 2 mg via INTRAVENOUS
  Filled 2017-10-07: qty 2

## 2017-10-07 MED ORDER — BUPIVACAINE IN DEXTROSE 0.75-8.25 % IT SOLN
INTRATHECAL | Status: DC | PRN
  Administered 2017-10-07: 1.8 mL via INTRATHECAL

## 2017-10-07 MED ORDER — OXYBUTYNIN CHLORIDE 5 MG PO TABS
10.0000 mg | ORAL_TABLET | Freq: Two times a day (BID) | ORAL | Status: DC
  Administered 2017-10-07 – 2017-10-09 (×4): 10 mg via ORAL
  Filled 2017-10-07 (×4): qty 2

## 2017-10-07 MED ORDER — 0.9 % SODIUM CHLORIDE (POUR BTL) OPTIME
TOPICAL | Status: DC | PRN
  Administered 2017-10-07: 1000 mL

## 2017-10-07 MED ORDER — PROPOFOL 10 MG/ML IV BOLUS
INTRAVENOUS | Status: AC
  Filled 2017-10-07: qty 40

## 2017-10-07 MED ORDER — LIDOCAINE 2% (20 MG/ML) 5 ML SYRINGE
INTRAMUSCULAR | Status: DC | PRN
  Administered 2017-10-07: 100 mg via INTRAVENOUS

## 2017-10-07 MED ORDER — OXYCODONE HCL 5 MG PO TABS: 5.0000 mg | ORAL_TABLET | ORAL | Status: DC | PRN

## 2017-10-07 MED ORDER — SODIUM CHLORIDE 0.9 % IJ SOLN
INTRAMUSCULAR | Status: AC
  Filled 2017-10-07: qty 10

## 2017-10-07 MED ORDER — OXYCODONE HCL 5 MG PO TABS
ORAL_TABLET | ORAL | Status: AC
  Filled 2017-10-07: qty 2

## 2017-10-07 MED ORDER — OXYCODONE HCL 5 MG PO TABS
10.0000 mg | ORAL_TABLET | ORAL | Status: DC | PRN
  Administered 2017-10-07 – 2017-10-09 (×11): 10 mg via ORAL
  Filled 2017-10-07 (×12): qty 2

## 2017-10-07 MED ORDER — ACETAMINOPHEN 325 MG PO TABS: 650.0000 mg | ORAL_TABLET | ORAL | Status: DC | PRN

## 2017-10-07 MED ORDER — PROMETHAZINE HCL 25 MG/ML IJ SOLN
INTRAMUSCULAR | Status: AC
  Filled 2017-10-07: qty 1

## 2017-10-07 MED ORDER — CEFAZOLIN SODIUM-DEXTROSE 2-4 GM/100ML-% IV SOLN
2.0000 g | Freq: Four times a day (QID) | INTRAVENOUS | Status: AC
  Administered 2017-10-07 (×2): 2 g via INTRAVENOUS
  Filled 2017-10-07 (×2): qty 100

## 2017-10-07 MED ORDER — ACETAMINOPHEN 500 MG PO TABS
ORAL_TABLET | ORAL | Status: AC
  Filled 2017-10-07: qty 2

## 2017-10-07 MED ORDER — METOCLOPRAMIDE HCL 5 MG/ML IJ SOLN: 5.0000 mg | Freq: Three times a day (TID) | INTRAMUSCULAR | Status: DC | PRN

## 2017-10-07 MED ORDER — SODIUM CHLORIDE 0.9 % IV SOLN
1000.0000 mg | Freq: Once | INTRAVENOUS | Status: AC
  Administered 2017-10-07: 1000 mg via INTRAVENOUS
  Filled 2017-10-07: qty 1100

## 2017-10-07 MED ORDER — LEVOTHYROXINE SODIUM 25 MCG PO TABS
25.0000 ug | ORAL_TABLET | Freq: Every day | ORAL | Status: DC
  Administered 2017-10-08 – 2017-10-09 (×2): 25 ug via ORAL
  Filled 2017-10-07 (×2): qty 1

## 2017-10-07 MED ORDER — EPHEDRINE SULFATE-NACL 50-0.9 MG/10ML-% IV SOSY
PREFILLED_SYRINGE | INTRAVENOUS | Status: DC | PRN
  Administered 2017-10-07 (×3): 5 mg via INTRAVENOUS
  Administered 2017-10-07: 10 mg via INTRAVENOUS

## 2017-10-07 MED ORDER — BUDESONIDE-FORMOTEROL FUMARATE 80-4.5 MCG/ACT IN AERO
2.0000 | INHALATION_SPRAY | Freq: Two times a day (BID) | RESPIRATORY_TRACT | Status: DC
  Filled 2017-10-07: qty 6.9

## 2017-10-07 MED ORDER — ONDANSETRON HCL 4 MG/2ML IJ SOLN
4.0000 mg | Freq: Four times a day (QID) | INTRAMUSCULAR | Status: DC | PRN
  Administered 2017-10-07: 4 mg via INTRAVENOUS

## 2017-10-07 MED ORDER — MIDAZOLAM HCL 2 MG/2ML IJ SOLN
1.0000 mg | INTRAMUSCULAR | Status: DC
  Administered 2017-10-07: 2 mg via INTRAVENOUS

## 2017-10-07 MED ORDER — ROPIVACAINE HCL 5 MG/ML IJ SOLN
INTRAMUSCULAR | Status: DC | PRN
  Administered 2017-10-07: 20 mL via PERINEURAL

## 2017-10-07 MED ORDER — PHENYLEPHRINE 40 MCG/ML (10ML) SYRINGE FOR IV PUSH (FOR BLOOD PRESSURE SUPPORT)
PREFILLED_SYRINGE | INTRAVENOUS | Status: AC
  Filled 2017-10-07: qty 10

## 2017-10-07 MED ORDER — EPHEDRINE 5 MG/ML INJ
INTRAVENOUS | Status: AC
  Filled 2017-10-07: qty 10

## 2017-10-07 MED ORDER — DEXAMETHASONE SODIUM PHOSPHATE 10 MG/ML IJ SOLN
10.0000 mg | Freq: Once | INTRAMUSCULAR | Status: AC
  Administered 2017-10-08: 08:00:00 10 mg via INTRAVENOUS
  Filled 2017-10-07: qty 1

## 2017-10-07 MED ORDER — MORPHINE SULFATE (PF) 4 MG/ML IV SOLN
1.0000 mg | INTRAVENOUS | Status: DC | PRN
  Administered 2017-10-07 – 2017-10-08 (×3): 1 mg via INTRAVENOUS
  Filled 2017-10-07 (×2): qty 1

## 2017-10-07 MED ORDER — SODIUM CHLORIDE 0.9 % IJ SOLN
INTRAMUSCULAR | Status: AC
  Filled 2017-10-07: qty 50

## 2017-10-07 MED ORDER — FENTANYL CITRATE (PF) 100 MCG/2ML IJ SOLN
50.0000 ug | INTRAMUSCULAR | Status: DC
  Administered 2017-10-07: 50 ug via INTRAVENOUS

## 2017-10-07 MED ORDER — FENTANYL CITRATE (PF) 100 MCG/2ML IJ SOLN
INTRAMUSCULAR | Status: AC
  Administered 2017-10-07: 50 ug via INTRAVENOUS
  Filled 2017-10-07: qty 2

## 2017-10-07 MED ORDER — STERILE WATER FOR IRRIGATION IR SOLN
Status: DC | PRN
  Administered 2017-10-07: 2000 mL

## 2017-10-07 MED ORDER — DEXAMETHASONE SODIUM PHOSPHATE 10 MG/ML IJ SOLN
INTRAMUSCULAR | Status: AC
  Filled 2017-10-07: qty 1

## 2017-10-07 MED ORDER — SODIUM CHLORIDE 0.9 % IJ SOLN
INTRAMUSCULAR | Status: DC | PRN
  Administered 2017-10-07: 60 mL via INTRAVENOUS

## 2017-10-07 MED ORDER — ONDANSETRON HCL 4 MG/2ML IJ SOLN
INTRAMUSCULAR | Status: DC | PRN
  Administered 2017-10-07: 4 mg via INTRAVENOUS

## 2017-10-07 MED ORDER — HYDROMORPHONE HCL 1 MG/ML IJ SOLN: 0.2500 mg | INTRAMUSCULAR | Status: DC | PRN

## 2017-10-07 MED ORDER — PROPOFOL 10 MG/ML IV BOLUS
INTRAVENOUS | Status: AC
  Filled 2017-10-07: qty 20

## 2017-10-07 MED ORDER — SODIUM CHLORIDE 0.9 % IV SOLN
INTRAVENOUS | Status: DC
  Administered 2017-10-07 – 2017-10-08 (×2): via INTRAVENOUS

## 2017-10-07 MED ORDER — ACETAMINOPHEN 10 MG/ML IV SOLN
1000.0000 mg | Freq: Once | INTRAVENOUS | Status: AC
  Administered 2017-10-07: 1000 mg via INTRAVENOUS
  Filled 2017-10-07: qty 100

## 2017-10-07 MED ORDER — PHENYLEPHRINE 40 MCG/ML (10ML) SYRINGE FOR IV PUSH (FOR BLOOD PRESSURE SUPPORT)
PREFILLED_SYRINGE | INTRAVENOUS | Status: DC | PRN
  Administered 2017-10-07: 80 ug via INTRAVENOUS
  Administered 2017-10-07: 40 ug via INTRAVENOUS
  Administered 2017-10-07 (×4): 80 ug via INTRAVENOUS

## 2017-10-07 MED ORDER — MOMETASONE FURO-FORMOTEROL FUM 100-5 MCG/ACT IN AERO
2.0000 | INHALATION_SPRAY | Freq: Two times a day (BID) | RESPIRATORY_TRACT | Status: DC
  Filled 2017-10-07: qty 8.8

## 2017-10-07 MED ORDER — PROMETHAZINE HCL 25 MG/ML IJ SOLN: 6.2500 mg | INTRAMUSCULAR | Status: DC | PRN

## 2017-10-07 MED ORDER — BISACODYL 10 MG RE SUPP: 10.0000 mg | Freq: Every day | RECTAL | Status: DC | PRN

## 2017-10-07 MED ORDER — HYDROCHLOROTHIAZIDE 12.5 MG PO CAPS
12.5000 mg | ORAL_CAPSULE | Freq: Every day | ORAL | Status: DC
  Administered 2017-10-07 – 2017-10-09 (×2): 12.5 mg via ORAL
  Filled 2017-10-07 (×2): qty 1

## 2017-10-07 MED ORDER — FUROSEMIDE 20 MG PO TABS: 20.0000 mg | ORAL_TABLET | Freq: Every day | ORAL | Status: DC | PRN

## 2017-10-07 MED ORDER — OXYCODONE HCL 5 MG/5ML PO SOLN
5.0000 mg | Freq: Once | ORAL | Status: DC | PRN
  Filled 2017-10-07: qty 5

## 2017-10-07 MED ORDER — MORPHINE SULFATE (PF) 4 MG/ML IV SOLN
INTRAVENOUS | Status: AC
  Filled 2017-10-07: qty 1

## 2017-10-07 MED ORDER — METOCLOPRAMIDE HCL 5 MG PO TABS: 5.0000 mg | ORAL_TABLET | Freq: Three times a day (TID) | ORAL | Status: DC | PRN

## 2017-10-07 MED ORDER — GABAPENTIN 300 MG PO CAPS
300.0000 mg | ORAL_CAPSULE | Freq: Once | ORAL | Status: AC
  Administered 2017-10-07: 300 mg via ORAL
  Filled 2017-10-07: qty 1

## 2017-10-07 MED ORDER — POLYETHYLENE GLYCOL 3350 17 G PO PACK: 17.0000 g | PACK | Freq: Every day | ORAL | Status: DC | PRN

## 2017-10-07 MED ORDER — ALBUTEROL SULFATE (2.5 MG/3ML) 0.083% IN NEBU: 2.5000 mg | INHALATION_SOLUTION | Freq: Four times a day (QID) | RESPIRATORY_TRACT | Status: DC | PRN

## 2017-10-07 MED ORDER — ASPIRIN EC 325 MG PO TBEC
325.0000 mg | DELAYED_RELEASE_TABLET | Freq: Two times a day (BID) | ORAL | Status: DC
  Administered 2017-10-08 – 2017-10-09 (×3): 325 mg via ORAL
  Filled 2017-10-07 (×3): qty 1

## 2017-10-07 MED ORDER — TRAMADOL HCL 50 MG PO TABS
50.0000 mg | ORAL_TABLET | Freq: Four times a day (QID) | ORAL | Status: DC | PRN
  Administered 2017-10-09: 07:00:00 100 mg via ORAL
  Filled 2017-10-07: qty 2

## 2017-10-07 MED ORDER — DOCUSATE SODIUM 100 MG PO CAPS
100.0000 mg | ORAL_CAPSULE | Freq: Two times a day (BID) | ORAL | Status: DC
  Administered 2017-10-07 – 2017-10-09 (×4): 100 mg via ORAL
  Filled 2017-10-07 (×4): qty 1

## 2017-10-07 SURGICAL SUPPLY — 51 items
BAG DECANTER FOR FLEXI CONT (MISCELLANEOUS) ×3 IMPLANT
BAG SPEC THK2 15X12 ZIP CLS (MISCELLANEOUS) ×1
BAG ZIPLOCK 12X15 (MISCELLANEOUS) ×3 IMPLANT
BANDAGE ACE 6X5 VEL STRL LF (GAUZE/BANDAGES/DRESSINGS) ×3 IMPLANT
BLADE SAG 18X100X1.27 (BLADE) ×3 IMPLANT
BLADE SAW SGTL 11.0X1.19X90.0M (BLADE) ×3 IMPLANT
BOWL SMART MIX CTS (DISPOSABLE) ×3 IMPLANT
CAPT KNEE TOTAL 3 ATTUNE ×2 IMPLANT
CEMENT HV SMART SET (Cement) ×6 IMPLANT
CLOSURE WOUND 1/2 X4 (GAUZE/BANDAGES/DRESSINGS) ×2
COVER SURGICAL LIGHT HANDLE (MISCELLANEOUS) ×3 IMPLANT
CUFF TOURN SGL QUICK 34 (TOURNIQUET CUFF) ×3
CUFF TRNQT CYL 34X4X40X1 (TOURNIQUET CUFF) ×1 IMPLANT
DECANTER SPIKE VIAL GLASS SM (MISCELLANEOUS) ×3 IMPLANT
DRAPE TOP SHEET (DRAPES) ×2 IMPLANT
DRAPE U-SHAPE 47X51 STRL (DRAPES) ×3 IMPLANT
DRSG ADAPTIC 3X8 NADH LF (GAUZE/BANDAGES/DRESSINGS) ×3 IMPLANT
DURAPREP 26ML APPLICATOR (WOUND CARE) ×3 IMPLANT
ELECT REM PT RETURN 15FT ADLT (MISCELLANEOUS) ×3 IMPLANT
EVACUATOR 1/8 PVC DRAIN (DRAIN) ×3 IMPLANT
GAUZE SPONGE 4X4 12PLY STRL (GAUZE/BANDAGES/DRESSINGS) ×3 IMPLANT
GLOVE BIO SURGEON STRL SZ7.5 (GLOVE) ×2 IMPLANT
GLOVE BIO SURGEON STRL SZ8 (GLOVE) ×3 IMPLANT
GLOVE BIOGEL PI IND STRL 6.5 (GLOVE) IMPLANT
GLOVE BIOGEL PI IND STRL 7.5 (GLOVE) IMPLANT
GLOVE BIOGEL PI IND STRL 8 (GLOVE) ×1 IMPLANT
GLOVE BIOGEL PI INDICATOR 6.5 (GLOVE) ×2
GLOVE BIOGEL PI INDICATOR 7.5 (GLOVE) ×6
GLOVE BIOGEL PI INDICATOR 8 (GLOVE) ×4
GLOVE ECLIPSE 7.5 STRL STRAW (GLOVE) ×2 IMPLANT
GLOVE SURG SS PI 6.5 STRL IVOR (GLOVE) ×2 IMPLANT
GOWN STRL REUS W/TWL LRG LVL3 (GOWN DISPOSABLE) ×7 IMPLANT
GOWN STRL REUS W/TWL XL LVL3 (GOWN DISPOSABLE) ×2 IMPLANT
HANDPIECE INTERPULSE COAX TIP (DISPOSABLE) ×3
IMMOBILIZER KNEE 20 (SOFTGOODS) ×3
IMMOBILIZER KNEE 20 THIGH 36 (SOFTGOODS) ×1 IMPLANT
MANIFOLD NEPTUNE II (INSTRUMENTS) ×3 IMPLANT
PACK TOTAL KNEE CUSTOM (KITS) ×3 IMPLANT
PAD ABD 8X10 STRL (GAUZE/BANDAGES/DRESSINGS) ×2 IMPLANT
PADDING CAST COTTON 6X4 STRL (CAST SUPPLIES) ×9 IMPLANT
POSITIONER SURGICAL ARM (MISCELLANEOUS) ×3 IMPLANT
SET HNDPC FAN SPRY TIP SCT (DISPOSABLE) ×1 IMPLANT
STRIP CLOSURE SKIN 1/2X4 (GAUZE/BANDAGES/DRESSINGS) ×4 IMPLANT
SUT MNCRL AB 4-0 PS2 18 (SUTURE) ×3 IMPLANT
SUT STRATAFIX SPIRL PDS+ 45CM (SUTURE) ×2 IMPLANT
SUT VIC AB 2-0 CT1 27 (SUTURE) ×9
SUT VIC AB 2-0 CT1 TAPERPNT 27 (SUTURE) ×3 IMPLANT
SYR 30ML LL (SYRINGE) ×6 IMPLANT
TRAY FOLEY CATH SILVER 14FR (SET/KITS/TRAYS/PACK) ×2 IMPLANT
WRAP KNEE MAXI GEL POST OP (GAUZE/BANDAGES/DRESSINGS) ×3 IMPLANT
YANKAUER SUCT BULB TIP 10FT TU (MISCELLANEOUS) ×3 IMPLANT

## 2017-10-07 NOTE — Anesthesia Procedure Notes (Signed)
Anesthesia Regional Block: Adductor canal block   Pre-Anesthetic Checklist: ,, timeout performed, Correct Patient, Correct Site, Correct Laterality, Correct Procedure, Correct Position, site marked, Risks and benefits discussed,  Surgical consent,  Pre-op evaluation,  At surgeon's request and post-op pain management  Laterality: Right  Prep: chloraprep       Needles:  Injection technique: Single-shot  Needle Type: Stimiplex     Needle Length: 9cm  Needle Gauge: 21     Additional Needles:   Procedures:,,,, ultrasound used (permanent image in chart),,,,  Narrative:  Start time: 10/07/2017 8:36 AM End time: 10/07/2017 8:41 AM Injection made incrementally with aspirations every 5 mL.  Performed by: Personally  Anesthesiologist: Lynda Rainwater, MD

## 2017-10-07 NOTE — Evaluation (Signed)
Physical Therapy Evaluation Patient Details Name: Stacie Russell MRN: [id number] DOB: Feb 20, 1951 Today's Date: 10/07/2017   History of Present Illness  Pt s/p R TKR  Clinical Impression  Pt s/p R TKR and presents with decreased R LE strength/ROM and post op pain limiting functional mobility.  Pt should progress to dc home with family assist.    Follow Up Recommendations Home health PT    Equipment Recommendations  None recommended by PT    Recommendations for Other Services OT consult     Precautions / Restrictions Precautions Precautions: Fall Restrictions Weight Bearing Restrictions: No Other Position/Activity Restrictions: WBAT      Mobility  Bed Mobility Overal bed mobility: Needs Assistance Bed Mobility: Supine to Sit;Sit to Supine     Supine to sit: Min assist;Mod assist Sit to supine: Mod assist   General bed mobility comments: cues for sequence and use of L LE to self assist  Transfers Overall transfer level: Needs assistance Equipment used: Rolling walker (2 wheeled) Transfers: Sit to/from Stand Sit to Stand: Min assist;Mod assist         General transfer comment: cues for LE management and use of UEs to self assist  Ambulation/Gait Ambulation/Gait assistance: Min assist;Mod assist Ambulation Distance (Feet): 7 Feet Assistive device: Rolling walker (2 wheeled) Gait Pattern/deviations: Step-to pattern;Decreased step length - right;Decreased step length - left;Decreased stance time - right;Shuffle;Trunk flexed Gait velocity: decr Gait velocity interpretation: Below normal speed for age/gender General Gait Details: cues for sequence, posture and position from RW.  Increased time with min WB tolerated R LE and distance limited by increased pain and onset dizziness  Stairs            Wheelchair Mobility    Modified Rankin (Stroke Patients Only)       Balance                                             Pertinent  Vitals/Pain Pain Assessment: 0-10 Pain Score: 10-Worst pain ever Pain Location: R knee Pain Descriptors / Indicators: Aching;Sore Pain Intervention(s): Limited activity within patient's tolerance;Monitored during session;Premedicated before session;Ice applied;Patient requesting pain meds-RN notified    Home Living Family/patient expects to be discharged to:: Private residence Living Arrangements: Spouse/significant other Available Help at Discharge: Family Type of Home: House Home Access: Stairs to enter Entrance Stairs-Rails: Right Entrance Stairs-Number of Steps: 6 Home Layout: One level Home Equipment: Environmental consultant - 2 wheels      Prior Function Level of Independence: Independent               Hand Dominance        Extremity/Trunk Assessment   Upper Extremity Assessment Upper Extremity Assessment: Overall WFL for tasks assessed    Lower Extremity Assessment Lower Extremity Assessment: RLE deficits/detail    Cervical / Trunk Assessment Cervical / Trunk Assessment: Normal  Communication   Communication: No difficulties  Cognition Arousal/Alertness: Awake/alert Behavior During Therapy: WFL for tasks assessed/performed Overall Cognitive Status: Within Functional Limits for tasks assessed Area of Impairment: Orientation                                      General Comments      Exercises Total Joint Exercises Ankle Circles/Pumps: AROM;Both;15 reps;Supine   Assessment/Plan    PT  Assessment Patient needs continued PT services  PT Problem List Decreased strength;Decreased range of motion;Decreased activity tolerance;Decreased mobility;Decreased knowledge of use of DME;Pain;Obesity       PT Treatment Interventions Gait training;DME instruction;Stair training;Functional mobility training;Therapeutic activities;Therapeutic exercise;Patient/family education    PT Goals (Current goals can be found in the Care Plan section)  Acute Rehab PT  Goals Patient Stated Goal: Regain IND PT Goal Formulation: With patient Time For Goal Achievement: 10/12/17 Potential to Achieve Goals: Good    Frequency 7X/week   Barriers to discharge        Co-evaluation               AM-PAC PT "6 Clicks" Daily Activity  Outcome Measure Difficulty turning over in bed (including adjusting bedclothes, sheets and blankets)?: Unable Difficulty moving from lying on back to sitting on the side of the bed? : Unable Difficulty sitting down on and standing up from a chair with arms (e.g., wheelchair, bedside commode, etc,.)?: Unable Help needed moving to and from a bed to chair (including a wheelchair)?: A Lot Help needed walking in hospital room?: A Lot Help needed climbing 3-5 steps with a railing? : Total 6 Click Score: 8    End of Session Equipment Utilized During Treatment: Gait belt;Right knee immobilizer Activity Tolerance: Patient limited by fatigue;Patient limited by pain Patient left: in bed;with call bell/phone within reach;with family/visitor present Nurse Communication: Mobility status PT Visit Diagnosis: Difficulty in walking, not elsewhere classified (R26.2)    Time: 9024-0973 PT Time Calculation (min) (ACUTE ONLY): 26 min   Charges:   PT Evaluation $PT Eval Low Complexity: 1 Low PT Treatments $Gait Training: 8-22 mins   PT G Codes:        Pg [phone]   Ligaya Cormier 10/07/2017, 6:30 PM

## 2017-10-07 NOTE — Progress Notes (Signed)
Orthopedic Tech Progress Note Patient Details:  Stacie Russell May 12, 1951 [id number]  CPM Right Knee CPM Right Knee: On Right Knee Flexion (Degrees): 40 Right Knee Extension (Degrees): 10  Post Interventions Patient Tolerated: Well Instructions Provided: Care of device  Stacie Russell 10/07/2017, 1:25 PM

## 2017-10-07 NOTE — Op Note (Signed)
OPERATIVE REPORT-TOTAL KNEE ARTHROPLASTY   Pre-operative diagnosis- Osteoarthritis  Right knee(s)  Post-operative diagnosis- Osteoarthritis Right knee(s)  Procedure-  Right  Total Knee Arthroplasty  Surgeon- Dione Plover. Jemery Stacey, MD  Assistant- Ardeen Jourdain, PA-C   Anesthesia-  Adductor canal block and spinal  EBL-50 mL   Drains Hemovac  Tourniquet time-  Total Tourniquet Time Documented: Thigh (Right) - 38 minutes Total: Thigh (Right) - 38 minutes     Complications- None  Condition-PACU - hemodynamically stable.   Brief Clinical Note  Stacie Russell is a 67 y.o. year old female with end stage OA of her right knee with progressively worsening pain and dysfunction. She has constant pain, with activity and at rest and significant functional deficits with difficulties even with ADLs. She has had extensive non-op management including analgesics, injections of cortisone and viscosupplements, and home exercise program, but remains in significant pain with significant dysfunction.Radiographs show bone on bone arthritis medial and patellofemoral. She presents now for right Total Knee Arthroplasty.    Procedure in detail---   The patient is brought into the operating room and positioned supine on the operating table. After successful administration of  Adductor canal block and spinal,   a tourniquet is placed high on the  Right thigh(s) and the lower extremity is prepped and draped in the usual sterile fashion. Time out is performed by the operating team and then the  Right lower extremity is wrapped in Esmarch, knee flexed and the tourniquet inflated to 300 mmHg.       A midline incision is made with a ten blade through the subcutaneous tissue to the level of the extensor mechanism. A fresh blade is used to make a medial parapatellar arthrotomy. Soft tissue over the proximal medial tibia is subperiosteally elevated to the joint line with a knife and into the semimembranosus bursa with  a Cobb elevator. Soft tissue over the proximal lateral tibia is elevated with attention being paid to avoiding the patellar tendon on the tibial tubercle. The patella is everted, knee flexed 90 degrees and the ACL and PCL are removed. Findings are bone on bone medial and patellofemoral with large global osteophytes.        The drill is used to create a starting hole in the distal femur and the canal is thoroughly irrigated with sterile saline to remove the fatty contents. The 5 degree Right  valgus alignment guide is placed into the femoral canal and the distal femoral cutting block is pinned to remove 9 mm off the distal femur. Resection is made with an oscillating saw.      The tibia is subluxed forward and the menisci are removed. The extramedullary alignment guide is placed referencing proximally at the medial aspect of the tibial tubercle and distally along the second metatarsal axis and tibial crest. The block is pinned to remove 43mm off the more deficient medial  side. Resection is made with an oscillating saw. Size 4is the most appropriate size for the tibia and the proximal tibia is prepared with the modular drill and keel punch for that size.      The femoral sizing guide is placed and size 4 is most appropriate. Rotation is marked off the epicondylar axis and confirmed by creating a rectangular flexion gap at 90 degrees. The size 4 cutting block is pinned in this rotation and the anterior, posterior and chamfer cuts are made with the oscillating saw. The intercondylar block is then placed and that cut is made.  Trial size 4 tibial component, trial size 4 posterior stabilized femur and a 6  mm posterior stabilized rotating platform insert trial is placed. Full extension is achieved with excellent varus/valgus and anterior/posterior balance throughout full range of motion. The patella is everted and thickness measured to be 21  mm. Free hand resection is taken to 12 mm, a 35 template is placed, lug  holes are drilled, trial patella is placed, and it tracks normally. Osteophytes are removed off the posterior femur with the trial in place. All trials are removed and the cut bone surfaces prepared with pulsatile lavage. Cement is mixed and once ready for implantation, the size 4 tibial implant, size  4 posterior stabilized femoral component, and the size 35 patella are cemented in place and the patella is held with the clamp. The trial insert is placed and the knee held in full extension. The Exparel (20 ml mixed with 60 ml saline) is injected into the extensor mechanism, posterior capsule, medial and lateral gutters and subcutaneous tissues.  All extruded cement is removed and once the cement is hard the permanent 6 mm posterior stabilized rotating platform insert is placed into the tibial tray.      The wound is copiously irrigated with saline solution and the extensor mechanism closed over a hemovac drain with #1 V-loc suture. The tourniquet is released for a total tourniquet time of 37  minutes. Flexion against gravity is 140 degrees and the patella tracks normally. Subcutaneous tissue is closed with 2.0 vicryl and subcuticular with running 4.0 Monocryl. The incision is cleaned and dried and steri-strips and a bulky sterile dressing are applied. The limb is placed into a knee immobilizer and the patient is awakened and transported to recovery in stable condition.      Please note that a surgical assistant was a medical necessity for this procedure in order to perform it in a safe and expeditious manner. Surgical assistant was necessary to retract the ligaments and vital neurovascular structures to prevent injury to them and also necessary for proper positioning of the limb to allow for anatomic placement of the prosthesis.   Dione Plover Demetruis Depaul, MD    10/07/2017, 10:24 AM

## 2017-10-07 NOTE — Progress Notes (Signed)
Ortho-Tech at the bedside at this time applying CPM. Pt is alert, calm and talkative and has no s/s of distress. Will continue to monitor and tx pt according to MD orders.

## 2017-10-07 NOTE — Anesthesia Postprocedure Evaluation (Signed)
Anesthesia Post Note  Patient: Stacie Russell  Procedure(s) Performed: RIGHT TOTAL KNEE ARTHROPLASTY (Right Knee)     Patient location during evaluation: PACU Anesthesia Type: Regional and Spinal Level of consciousness: oriented and awake and alert Pain management: pain level controlled Vital Signs Assessment: post-procedure vital signs reviewed and stable Respiratory status: spontaneous breathing and respiratory function stable Cardiovascular status: blood pressure returned to baseline and stable Postop Assessment: no headache, no backache and no apparent nausea or vomiting Anesthetic complications: no    Last Vitals:  Vitals:   10/07/17 1200 10/07/17 1215  BP: 110/70 115/78  Pulse: 75 81  Resp: 14 19  Temp:    SpO2: 100% 97%    Last Pain:  Vitals:   10/07/17 1215  TempSrc:   PainSc: 0-No pain                 Lynda Rainwater

## 2017-10-07 NOTE — Progress Notes (Signed)
Orthopedic Tech Progress Note Patient Details:  Stacie Russell 1951-06-03 [id number]  Ortho Devices Ortho Device/Splint Interventions: Application   Post Interventions Patient Tolerated: Well Instructions Provided: Adjustment of device, Care of device   [name] 10/07/2017, 1:39 PM

## 2017-10-07 NOTE — Progress Notes (Signed)
AssistedDr. Miller with right, ultrasound guided, adductor canal block. Side rails up, monitors on throughout procedure. See vital signs in flow sheet. Tolerated Procedure well.  

## 2017-10-07 NOTE — Progress Notes (Signed)
Pt placed in HOLDING Status at this time awaiting a room on the floor. Pt is alert, calm and talkative and has no s/s of distress. Pt denies additional complaints. VS and orders assessed and family is at the bedside. Will continue to monitor and tx pt according to MD orders.

## 2017-10-07 NOTE — Interval H&P Note (Signed)
History and Physical Interval Note:  10/07/2017 7:09 AM  Stacie Russell  has presented today for surgery, with the diagnosis of Osteoarthritis Right knee  The various methods of treatment have been discussed with the patient and family. After consideration of risks, benefits and other options for treatment, the patient has consented to  Procedure(s): RIGHT TOTAL KNEE ARTHROPLASTY (Right) as a surgical intervention .  The patient's history has been reviewed, patient examined, no change in status, stable for surgery.  I have reviewed the patient's chart and labs.  Questions were answered to the patient's satisfaction.     Pilar Plate Mccartney Brucks

## 2017-10-07 NOTE — Progress Notes (Signed)
Husband is at the bedside at this time. VS and orders assessed and pr denies additional complaints. Will continue to monitor and tx pt according to MD orders.

## 2017-10-07 NOTE — Progress Notes (Signed)
Pt had no hot water at home, so she called over the weekend to see if she could take a shower here at the hospital.  Pt was taken by the [facility] to [place] to shower.  Pt arrived in short stay at 0657.

## 2017-10-07 NOTE — Transfer of Care (Signed)
Immediate Anesthesia Transfer of Care Note  Patient: Stacie Russell  Procedure(s) Performed: RIGHT TOTAL KNEE ARTHROPLASTY (Right Knee)  Patient Location: PACU  Anesthesia Type:MAC and Spinal  Level of Consciousness: awake, alert  and oriented  Airway & Oxygen Therapy: Patient Spontanous Breathing and Patient connected to face mask oxygen  Post-op Assessment: Report given to RN and Post -op Vital signs reviewed and stable  Post vital signs: Reviewed and stable  Last Vitals:  Vitals:   10/07/17 0845 10/07/17 0900  BP: 127/61 122/78  Pulse: 71 71  Resp: 14 15  Temp:    SpO2: 96% 98%    Last Pain:  Vitals:   10/07/17 0752  TempSrc: Oral         Complications: No apparent anesthesia complications

## 2017-10-07 NOTE — Anesthesia Procedure Notes (Signed)
Spinal  Patient location during procedure: OR Start time: 10/07/2017 9:21 AM End time: 10/07/2017 9:26 AM Staffing Anesthesiologist: Lynda Rainwater, MD Performed: anesthesiologist  Preanesthetic Checklist Completed: patient identified, site marked, surgical consent, pre-op evaluation, timeout performed, IV checked, risks and benefits discussed and monitors and equipment checked Spinal Block Patient position: sitting Prep: Betadine Patient monitoring: heart rate, cardiac monitor, continuous pulse ox and blood pressure Approach: right paramedian Location: L3-4 Injection technique: single-shot Needle Needle type: Quincke  Needle gauge: 22 G Needle length: 9 cm

## 2017-10-07 NOTE — Anesthesia Preprocedure Evaluation (Signed)
Anesthesia Evaluation  Patient identified by MRN, date of birth, ID band Patient awake    Reviewed: Allergy & Precautions, NPO status , Patient's Chart, lab work & pertinent test results  Airway Mallampati: II  TM Distance: >3 FB Neck ROM: Full    Dental no notable dental hx.    Pulmonary neg pulmonary ROS, asthma , sleep apnea ,    Pulmonary exam normal breath sounds clear to auscultation       Cardiovascular hypertension, Pt. on medications negative cardio ROS Normal cardiovascular exam Rhythm:Regular Rate:Normal     Neuro/Psych negative neurological ROS  negative psych ROS   GI/Hepatic negative GI ROS, Neg liver ROS, GERD  ,  Endo/Other  negative endocrine ROSMorbid obesity  Renal/GU negative Renal ROS  negative genitourinary   Musculoskeletal negative musculoskeletal ROS (+) Arthritis , Osteoarthritis,    Abdominal   Peds negative pediatric ROS (+)  Hematology negative hematology ROS (+)   Anesthesia Other Findings   Reproductive/Obstetrics negative OB ROS                             Anesthesia Physical Anesthesia Plan  ASA: III  Anesthesia Plan: Spinal and Regional   Post-op Pain Management:  Regional for Post-op pain   Induction: Intravenous  PONV Risk Score and Plan: 2 and Ondansetron and Midazolam  Airway Management Planned: Simple Face Mask  Additional Equipment:   Intra-op Plan:   Post-operative Plan:   Informed Consent: I have reviewed the patients History and Physical, chart, labs and discussed the procedure including the risks, benefits and alternatives for the proposed anesthesia with the patient or authorized representative who has indicated his/her understanding and acceptance.   Dental advisory given  Plan Discussed with: CRNA  Anesthesia Plan Comments:         Anesthesia Quick Evaluation

## 2017-10-08 LAB — BASIC METABOLIC PANEL
Anion gap: 9 (ref 5–15)
BUN: 12 mg/dL (ref 6–20)
CALCIUM: 8.8 mg/dL — AB (ref 8.9–10.3)
CO2: 24 mmol/L (ref 22–32)
CREATININE: 0.66 mg/dL (ref 0.44–1.00)
Chloride: 107 mmol/L (ref 101–111)
GFR calc non Af Amer: >60 mL/min (ref >60)
Glucose, Bld: 121 mg/dL — ABNORMAL HIGH (ref 65–99)
Potassium: 4.2 mmol/L (ref 3.5–5.1)
SODIUM: 140 mmol/L (ref 135–145)

## 2017-10-08 LAB — CBC
HCT: 34 % — ABNORMAL LOW (ref 36.0–46.0)
Hemoglobin: 11.4 g/dL — ABNORMAL LOW (ref 12.0–15.0)
MCH: 31.2 pg (ref 26.0–34.0)
MCHC: 33.5 g/dL (ref 30.0–36.0)
MCV: 93.2 fL (ref 78.0–100.0)
PLATELETS: 219 10*3/uL (ref 150–400)
RBC: 3.65 MIL/uL — ABNORMAL LOW (ref 3.87–5.11)
RDW: 12.9 % (ref 11.5–15.5)
WBC: 12.3 10*3/uL — ABNORMAL HIGH (ref 4.0–10.5)

## 2017-10-08 MED ORDER — ASPIRIN 325 MG PO TBEC: 325.0000 mg | DELAYED_RELEASE_TABLET | Freq: Two times a day (BID) | ORAL | 0 refills | Status: DC

## 2017-10-08 MED ORDER — TRAMADOL HCL 50 MG PO TABS: 50.0000 mg | ORAL_TABLET | Freq: Four times a day (QID) | ORAL | 0 refills | Status: DC | PRN

## 2017-10-08 MED ORDER — SODIUM CHLORIDE 0.9 % IV BOLUS (SEPSIS)
250.0000 mL | Freq: Once | INTRAVENOUS | Status: AC
  Administered 2017-10-08: 09:00:00 250 mL via INTRAVENOUS

## 2017-10-08 MED ORDER — TEMAZEPAM 15 MG PO CAPS
15.0000 mg | ORAL_CAPSULE | Freq: Every evening | ORAL | Status: DC | PRN
  Administered 2017-10-08: 15 mg via ORAL
  Filled 2017-10-08: qty 1

## 2017-10-08 MED ORDER — METHOCARBAMOL 500 MG PO TABS: 500.0000 mg | ORAL_TABLET | Freq: Four times a day (QID) | ORAL | 0 refills | Status: DC | PRN

## 2017-10-08 MED ORDER — OXYCODONE HCL 5 MG PO TABS: 5.0000 mg | ORAL_TABLET | ORAL | 0 refills | Status: DC | PRN

## 2017-10-08 NOTE — Evaluation (Signed)
Occupational Therapy Evaluation Patient Details Name: Stacie Russell MRN: [id number] DOB: 1951/01/30 Today's Date: 10/08/2017    History of Present Illness Pt s/p R TKR   Clinical Impression   Pt admitted as above currently Min A for walk in shower transfer, Min guard for toilet transfer with R KI on during functional mobility and transfers. Pt reports that she has necessary DME and that her spouse will assist PRN at d/c. No further acute OT needs identified at this time. Will sign off acute OT.    Follow Up Recommendations  No OT follow up;Supervision - Intermittent    Equipment Recommendations  None recommended by OT(Pt reports that she has DME)    Recommendations for Other Services       Precautions / Restrictions Precautions Precautions: Fall Restrictions Weight Bearing Restrictions: No Other Position/Activity Restrictions: WBAT      Mobility Bed Mobility Overal bed mobility: (Pt received up in chair)                Transfers Overall transfer level: Needs assistance Equipment used: Rolling walker (2 wheeled) Transfers: Sit to/from [facility] Sit to Stand: Min guard Stand pivot transfers: Min guard       General transfer comment: Overall increased time. Pt demonstrated proper UE, RW use during transfers with Min guard assist.     Balance Overall balance assessment: No apparent balance deficits (not formally assessed)                                         ADL either performed or assessed with clinical judgement   ADL Overall ADL's : Needs assistance/impaired Eating/Feeding: Set up;Sitting   Grooming: Wash/dry hands;Supervision/safety;Standing   Upper Body Bathing: Set up;Sitting   Lower Body Bathing: Minimal assistance;Sitting/lateral leans;Sit to/from stand   Upper Body Dressing : Set up;Sitting   Lower Body Dressing: Minimal assistance;Sit to/from stand   Toilet Transfer: Min  guard;RW;BSC;Ambulation;Cueing for sequencing   Toileting- Water quality scientist and Hygiene: Min guard;Sit to/from stand;Sitting/lateral lean   Tub/ Shower Transfer: Walk-in shower;Min guard;Tub bench;Anterior/posterior;Rolling walker Tub/Shower Transfer Details (indicate cue type and reason): Discussed use of 3:1 at home in shower as pt reports that her built in shower seat is "too low" Functional mobility during ADLs: Rolling walker;Supervision/safety General ADL Comments: Pt was assessed by OT followed by education in ADL's, removal of throw rugs at home, placing frequently used items on counter top in kitchen, bath etc. Pt also performed toilet and shower transfer with Min guard and Min Assist. Pt was also educated in LB dressing. Pt spouse reports that he will assist her PRN at D/C. She has DME, no further acute OT needs at this time.     Vision Baseline Vision/History: Wears glasses Wears Glasses: Reading only Patient Visual Report: No change from baseline       Perception     Praxis      Pertinent Vitals/Pain Pain Assessment: 0-10 Pain Score: 2  Pain Location: R knee Pain Descriptors / Indicators: Aching;Sore Pain Intervention(s): Limited activity within patient's tolerance;Monitored during session;Premedicated before session;Repositioned;Ice applied     Hand Dominance Right   Extremity/Trunk Assessment Upper Extremity Assessment Upper Extremity Assessment: Overall WFL for tasks assessed   Lower Extremity Assessment Lower Extremity Assessment: Defer to PT evaluation   Cervical / Trunk Assessment Cervical / Trunk Assessment: Normal   Communication Communication Communication: No difficulties   Cognition  Arousal/Alertness: Awake/alert Behavior During Therapy: WFL for tasks assessed/performed Overall Cognitive Status: Within Functional Limits for tasks assessed                                     General Comments       Exercises     Shoulder  Instructions      Home Living Family/patient expects to be discharged to:: Private residence Living Arrangements: Spouse/significant other Available Help at Discharge: Family Type of Home: House Home Access: Stairs to enter Technical brewer of Steps: 6 Entrance Stairs-Rails: Right Home Layout: One level     Bathroom Shower/Tub: Walk-in shower;Door   [employer] Toilet: Standard     Home Equipment: Environmental consultant - 2 wheels;Shower seat - built in;Bedside commode          Prior Functioning/Environment Level of Independence: Independent                 OT Problem List: Pain      OT Treatment/Interventions:      OT Goals(Current goals can be found in the care plan section) Acute Rehab OT Goals Patient Stated Goal: Go home tomorrow (10/09/17) with spouse assist PRN OT Goal Formulation: All assessment and education complete, DC therapy  OT Frequency:     Barriers to D/C:            Co-evaluation              AM-PAC PT "6 Clicks" Daily Activity     Outcome Measure Help from another person eating meals?: None Help from another person taking care of personal grooming?: A Little Help from another person toileting, which includes using toliet, bedpan, or urinal?: A Little Help from another person bathing (including washing, rinsing, drying)?: A Little Help from another person to put on and taking off regular upper body clothing?: None Help from another person to put on and taking off regular lower body clothing?: A Little 6 Click Score: 20   End of Session Equipment Utilized During Treatment: Rolling walker;Right knee immobilizer  Activity Tolerance: Patient tolerated treatment well;No increased pain("I'm doing better now than I did earlier") Patient left: in chair;with call bell/phone within reach;with family/visitor present;Other (comment)(MD in room)  OT Visit Diagnosis: Pain Pain - Right/Left: Right Pain - part of body: Knee                Time:  1039-1106 OT Time Calculation (min): 27 min Charges:  OT General Charges $OT Visit: 1 Visit OT Evaluation $OT Eval Low Complexity: 1 Low OT Treatments $Self Care/Home Management : 8-22 mins G-Codes:     Anniebell Bedore Beth Dixon, OTR/L 10/08/2017, 11:17 AM

## 2017-10-08 NOTE — Discharge Summary (Signed)
Physician Discharge Summary   Patient ID: Stacie Russell MRN: [id number] DOB/AGE: 69/24/1952 68 y.o.  Admit date: 10/07/2017 Discharge date: 10-09-2017  Primary Diagnosis:  Osteoarthritis Right knee(s)   Admission Diagnoses:  Past Medical History:  Diagnosis Date  . Arthritis   . Asthma   . Diverticulosis   . Endometrial hyperplasia   . Esophageal stricture   . Foot pain   . Fundic gland polyps of stomach, benign 07/10/2015  . Gastritis   . Hemorrhoid   . Hiatal hernia   . Hip pain   . Hyperlipidemia   . Hypertension   . Knee pain   . Metabolic syndrome   . Obstructive sleep apnea 08/27/2017  . Oxygen deficiency   . Tendonitis of ankle   . Vitamin D deficiency    Discharge Diagnoses:   Principal Problem:   OA (osteoarthritis) of knee  Estimated body mass index is 41.52 kg/m as calculated from the following:   Height as of this encounter: _0  (1.575 m).   Weight as of this encounter: 103 kg (227 lb).  Procedure:  Procedure(s) (LRB): RIGHT TOTAL KNEE ARTHROPLASTY (Right)   Consults: None  HPI: Stacie Russell is a 67 y.o. year old female with end stage OA of her right knee with progressively worsening pain and dysfunction. She has constant pain, with activity and at rest and significant functional deficits with difficulties even with ADLs. She has had extensive non-op management including analgesics, injections of cortisone and viscosupplements, and home exercise program, but remains in significant pain with significant dysfunction.Radiographs show bone on bone arthritis medial and patellofemoral. She presents now for right Total Knee Arthroplasty.    Laboratory Data: Admission on 10/07/2017  Component Date Value Ref Range Status  . WBC 10/08/2017 12.3* 4.0 - 10.5 K/uL Final  . RBC 10/08/2017 3.65* 3.87 - 5.11 MIL/uL Final  . Hemoglobin 10/08/2017 11.4* 12.0 - 15.0 g/dL Final  . HCT 10/08/2017 34.0* 36.0 - 46.0 % Final  . MCV 10/08/2017 93.2  78.0 - 100.0  fL Final  . MCH 10/08/2017 31.2  26.0 - 34.0 pg Final  . MCHC 10/08/2017 33.5  30.0 - 36.0 g/dL Final  . RDW 10/08/2017 12.9  11.5 - 15.5 % Final  . Platelets 10/08/2017 219  150 - 400 K/uL Final   Performed at [facility], [place] [address], [place], [place] [address]  . Sodium 10/08/2017 140  135 - 145 mmol/L Final  . Potassium 10/08/2017 4.2  3.5 - 5.1 mmol/L Final  . Chloride 10/08/2017 107  101 - 111 mmol/L Final  . CO2 10/08/2017 24  22 - 32 mmol/L Final  . Glucose, Bld 10/08/2017 121* 65 - 99 mg/dL Final  . BUN 10/08/2017 12  6 - 20 mg/dL Final  . Creatinine, Ser 10/08/2017 0.66  0.44 - 1.00 mg/dL Final  . Calcium 10/08/2017 8.8* 8.9 - 10.3 mg/dL Final  . GFR calc non Af Amer 10/08/2017 >60  >60 mL/min Final  . GFR calc Af Amer 10/08/2017 >60  >60 mL/min Final   Comment: (NOTE) The eGFR has been calculated using the CKD EPI equation. This calculation has not been validated in all clinical situations. eGFR's persistently <60 mL/min signify possible Chronic Kidney Disease.   Georgiann Hahn gap 10/08/2017 9  5 - 15 Final   Performed at [facility], [place] [address], [place],  [address]  Hospital Outpatient Visit on 10/01/2017  Component Date Value Ref Range Status  . MRSA, PCR  10/01/2017 NEGATIVE  NEGATIVE Final  . Staphylococcus aureus 10/01/2017 NEGATIVE  NEGATIVE Final   Comment: (NOTE) The Xpert SA Assay (FDA approved for NASAL specimens in patients 99 years of age and older), is one component of a comprehensive surveillance program. It is not intended to diagnose infection nor to guide or monitor treatment. Performed at [facility], [place] [address], [place], [place] [address]   . aPTT 10/01/2017 28  24 - 36 seconds Final   Performed at [facility], [place] [address] [place], [place] [address]  . WBC 10/01/2017 7.9  4.0 - 10.5 K/uL Final  . RBC 10/01/2017 4.57  3.87 - 5.11 MIL/uL Final  .  Hemoglobin 10/01/2017 14.5  12.0 - 15.0 g/dL Final  . HCT 10/01/2017 42.7  36.0 - 46.0 % Final  . MCV 10/01/2017 93.4  78.0 - 100.0 fL Final  . MCH 10/01/2017 31.7  26.0 - 34.0 pg Final  . MCHC 10/01/2017 34.0  30.0 - 36.0 g/dL Final  . RDW 10/01/2017 12.9  11.5 - 15.5 % Final  . Platelets 10/01/2017 231  150 - 400 K/uL Final   Performed at [facility], [place] [address], [place], [place] [address]  . Sodium 10/01/2017 142  135 - 145 mmol/L Final  . Potassium 10/01/2017 3.6  3.5 - 5.1 mmol/L Final  . Chloride 10/01/2017 104  101 - 111 mmol/L Final  . CO2 10/01/2017 27  22 - 32 mmol/L Final  . Glucose, Bld 10/01/2017 100* 65 - 99 mg/dL Final  . BUN 10/01/2017 19  6 - 20 mg/dL Final  . Creatinine, Ser 10/01/2017 0.72  0.44 - 1.00 mg/dL Final  . Calcium 10/01/2017 9.4  8.9 - 10.3 mg/dL Final  . Total Protein 10/01/2017 7.4  6.5 - 8.1 g/dL Final  . Albumin 10/01/2017 4.3  3.5 - 5.0 g/dL Final  . AST 10/01/2017 19  15 - 41 U/L Final  . ALT 10/01/2017 18  14 - 54 U/L Final  . Alkaline Phosphatase 10/01/2017 81  38 - 126 U/L Final  . Total Bilirubin 10/01/2017 0.8  0.3 - 1.2 mg/dL Final  . GFR calc non Af Amer 10/01/2017 >60  >60 mL/min Final  . GFR calc Af Amer 10/01/2017 >60  >60 mL/min Final   Comment: (NOTE) The eGFR has been calculated using the CKD EPI equation. This calculation has not been validated in all clinical situations. eGFR's persistently <60 mL/min signify possible Chronic Kidney Disease.   Georgiann Hahn gap 10/01/2017 11  5 - 15 Final   Performed at [facility], [place] [address], [place], [place] [address]  . Prothrombin Time 10/01/2017 13.0  11.4 - 15.2 seconds Final  . INR 10/01/2017 0.99   Final   Performed at [place] [address], [place], [place] [address]  . ABO/RH(D) 10/01/2017 A POS   Final  . Antibody Screen 10/01/2017 NEG   Final  . Sample Expiration 10/01/2017 10/10/2017   Final  . Extend sample reason  10/01/2017    Final                   Value:NO TRANSFUSIONS OR PREGNANCY IN THE PAST 3 MONTHS Performed at [facility], [place] [address], [place], [place] [address]   . ABO/RH(D) 10/01/2017    Final                   Value:A POS Performed at [facility], [place] [address] [place], [place] [address]      X-Rays:Dg Foot 2 Views Left  Result Date: 09/20/2017 Please see detailed radiograph report in office note.   EKG: Orders placed or performed in visit on 01/14/17  . EKG 12-Lead     Hospital Course: Stacie Russell is a 67 y.o. who was admitted to [facility]. They were brought to the operating room on 10/07/2017 and underwent Procedure(s): RIGHT TOTAL KNEE ARTHROPLASTY.  Patient tolerated the procedure well and was later transferred to the recovery room and then to the orthopaedic floor for postoperative care.  They were given PO and IV analgesics for pain control following their surgery.  They were given 24 hours of postoperative antibiotics of  Anti-infectives (From admission, onward)   Start     Dose/Rate Route Frequency Ordered Stop   10/07/17 1530  ceFAZolin (ANCEF) IVPB 2g/100 mL premix     2 g 200 mL/hr over 30 Minutes Intravenous Every 6 hours 10/07/17 1247 10/07/17 2204   10/07/17 0904  ceFAZolin (ANCEF) 2-4 GM/100ML-% IVPB    Comments:  Kyra Leyland   : cabinet override      10/07/17 0904 10/07/17 0924   10/07/17 0659  ceFAZolin (ANCEF) IVPB 2g/100 mL premix     2 g 200 mL/hr over 30 Minutes Intravenous On call to O.R. 10/07/17 4235 10/07/17 0944     and started on DVT prophylaxis in the form of Xarelto.   PT and OT were ordered for total joint protocol.  Discharge planning consulted to help with postop disposition and equipment needs.  Patient had a tough night on the evening of surgery but better the next day. They started to get up OOB with therapy on day one. Hemovac drain was pulled without difficulty.  Continued to  work with therapy into day two.  Dressing was changed on day two and the incision was healing well.   Patient was seen in rounds and was ready to go home.   Diet - Cardiac diet Follow up - in 2 weeks Activity - WBAT Disposition - Home Condition Upon Discharge - Stable D/C Meds - See DC Summary DVT Prophylaxis - Xarelto    Discharge Instructions    Call MD / Call 911   Complete by:  As directed    If you experience chest pain or shortness of breath, CALL 911 and be transported to the hospital emergency room.  If you develope a fever above 101 F, pus (white drainage) or increased drainage or redness at the wound, or calf pain, call your surgeon's office.   Change dressing   Complete by:  As directed    Change dressing daily with sterile 4 x 4 inch gauze dressing and apply TED hose. Do not submerge the incision under water.   Constipation Prevention   Complete by:  As directed    Drink plenty of fluids.  Prune juice may be helpful.  You may use a stool softener, such as Colace (over the counter) 100 mg twice a day.  Use MiraLax (over the counter) for constipation as needed.   Diet - low sodium heart healthy   Complete by:  As directed    Discharge instructions   Complete by:  As directed    Take a full dose enteric coated 325 mg Aspirin twice a day for three weeks, then reduce back to the 81 mg Aspirin daily at home.  Pick up stool softner and laxative for home use  following surgery while on pain medications. Do not submerge incision under water. Please use good hand washing techniques while changing dressing each day. May shower starting three days after surgery. Please use a clean towel to pat the incision dry following showers. Continue to use ice for pain and swelling after surgery. Do not use any lotions or creams on the incision until instructed by your surgeon.  Wear both TED hose on both legs during the day every day for three weeks, but may remove the TED hose at night at  home.  Postoperative Constipation Protocol  Constipation - defined medically as fewer than three stools per week and severe constipation as less than one stool per week.  One of the most common issues patients have following surgery is constipation.  Even if you have a regular bowel pattern at home, your normal regimen is likely to be disrupted due to multiple reasons following surgery.  Combination of anesthesia, postoperative narcotics, change in appetite and fluid intake all can affect your bowels.  In order to avoid complications following surgery, here are some recommendations in order to help you during your recovery period.  Colace (docusate) - Pick up an over-the-counter form of Colace or another stool softener and take twice a day as long as you are requiring postoperative pain medications.  Take with a full glass of water daily.  If you experience loose stools or diarrhea, hold the colace until you stool forms back up.  If your symptoms do not get better within 1 week or if they get worse, check with your doctor.  Dulcolax (bisacodyl) - Pick up over-the-counter and take as directed by the product packaging as needed to assist with the movement of your bowels.  Take with a full glass of water.  Use this product as needed if not relieved by Colace only.   MiraLax (polyethylene glycol) - Pick up over-the-counter to have on hand.  MiraLax is a solution that will increase the amount of water in your bowels to assist with bowel movements.  Take as directed and can mix with a glass of water, juice, soda, coffee, or tea.  Take if you go more than two days without a movement. Do not use MiraLax more than once per day. Call your doctor if you are still constipated or irregular after using this medication for 7 days in a row.  If you continue to have problems with postoperative constipation, please contact the office for further assistance and recommendations.  If you experience "the worst abdominal  pain ever" or develop nausea or vomiting, please contact the office immediatly for further recommendations for treatment.   Do not put a pillow under the knee. Place it under the heel.   Complete by:  As directed    Do not sit on low chairs, stoools or toilet seats, as it may be difficult to get up from low surfaces   Complete by:  As directed    Driving restrictions   Complete by:  As directed    No driving until released by the physician.   Increase activity slowly as tolerated   Complete by:  As directed    Lifting restrictions   Complete by:  As directed    No lifting until released by the physician.   Patient may shower   Complete by:  As directed    You may shower without a dressing once there is no drainage.  Do not wash over the wound.  If drainage remains, do  not shower until drainage stops.   TED hose   Complete by:  As directed    Use stockings (TED hose) for 3 weeks on both leg(s).  You may remove them at night for sleeping.   Weight bearing as tolerated   Complete by:  As directed    Laterality:  right   Extremity:  Lower     Allergies as of 10/08/2017   No Known Allergies     Medication List    STOP taking these medications   aspirin 81 MG tablet Replaced by:  aspirin 325 MG EC tablet   Fish Oil 500 MG Caps   VITAMIN D3 PO     TAKE these medications   acetaminophen 500 MG tablet Commonly known as:  TYLENOL Take 1,000 mg by mouth 2 (two) times daily as needed for moderate pain or headache.   albuterol 108 (90 Base) MCG/ACT inhaler Commonly known as:  PROVENTIL HFA;VENTOLIN HFA Inhale 2 puffs into the lungs every 6 (six) hours as needed for wheezing or shortness of breath.   aspirin 325 MG EC tablet Take 1 tablet (325 mg total) by mouth 2 (two) times daily. Take a full dose enteric coated 325 mg Aspirin twice a day for three weeks, then reduce back to the 81 mg Aspirin daily at home. Start taking on:  10/09/2017 Replaces:  aspirin 81 MG tablet     budesonide-formoterol 80-4.5 MCG/ACT inhaler Commonly known as:  SYMBICORT Inhale 2 puffs into the lungs 2 (two) times daily.   busPIRone 15 MG tablet Commonly known as:  BUSPAR Take 1 tablet (15 mg total) by mouth 3 (three) times daily as needed.   furosemide 20 MG tablet Commonly known as:  LASIX Take 1 tablet (20 mg total) by mouth daily as needed. What changed:  reasons to take this   levothyroxine 25 MCG tablet Commonly known as:  SYNTHROID, LEVOTHROID TAKE 1 TABLET BY MOUTH  DAILY BEFORE BREAKFAST   losartan-hydrochlorothiazide 100-12.5 MG tablet Commonly known as:  HYZAAR Take 1 tablet by mouth daily.   methocarbamol 500 MG tablet Commonly known as:  ROBAXIN Take 1 tablet (500 mg total) by mouth every 6 (six) hours as needed for muscle spasms.   oxybutynin 5 MG tablet Commonly known as:  DITROPAN Take 2 tablets (10 mg total) by mouth 2 (two) times daily.   oxyCODONE 5 MG immediate release tablet Commonly known as:  Oxy IR/ROXICODONE Take 1-2 tablets (5-10 mg total) by mouth every 4 (four) hours as needed for moderate pain or severe pain.   pantoprazole 40 MG tablet Commonly known as:  PROTONIX TAKE ONE TABLET BY MOUTH  ONCE DAILY BEFORE BREAKFAST   traMADol 50 MG tablet Commonly known as:  ULTRAM Take 1-2 tablets (50-100 mg total) by mouth every 6 (six) hours as needed (mild pain).            Discharge Care Instructions  (From admission, onward)        Start     Ordered   10/08/17 0000  Weight bearing as tolerated    Question Answer Comment  Laterality right   Extremity Lower      10/08/17 2216   10/08/17 0000  Change dressing    Comments:  Change dressing daily with sterile 4 x 4 inch gauze dressing and apply TED hose. Do not submerge the incision under water.   10/08/17 2216     Follow-up Information    [facility].  Specialty:  [place] Why:  physical therapy Contact information: [address] [place] [address] [phone]        Gaynelle Arabian, MD. Schedule an appointment as soon as possible for a visit on 10/22/2017.   Specialty:  Orthopedic Surgery Contact information: [address] [place] [address] [phone]           Signed: Arlee Muslim, PA-C Orthopaedic Surgery 10/08/2017, 10:17 PM

## 2017-10-08 NOTE — Progress Notes (Signed)
Discharge planning, spoke with patient and spouse at beside. Chose [facility] for HH services, PT to eval and treat. Contacted [facility] for referral. Has RW and 3-n-1. [phone] 

## 2017-10-08 NOTE — Progress Notes (Signed)
Physical Therapy Treatment Patient Details Name: Stacie Russell MRN: [id number] DOB: 02-20-1951 Today's Date: 10/08/2017    History of Present Illness Pt s/p R TKR    PT Comments    POD # 1 pm session Tolerated an increased distance.  Assisted back to bed for CPM.  Pt plans to D/C to home tomorrow with spouse.     Follow Up Recommendations  Home health PT     Equipment Recommendations  None recommended by PT    Recommendations for Other Services       Precautions / Restrictions Precautions Precautions: Fall Precaution Comments: instructed on KI use for amb until able to perform 10 active SLR Restrictions Weight Bearing Restrictions: No Other Position/Activity Restrictions: WBAT    Mobility  Bed Mobility Overal bed mobility: Needs Assistance Bed Mobility: Supine to Sit     Supine to sit: Min assist     General bed mobility comments: assisted back to bed  Transfers Overall transfer level: Needs assistance Equipment used: Rolling walker (2 wheeled) Transfers: Sit to/from [facility] Sit to Stand: Min assist;+2 safety/equipment Stand pivot transfers: Min assist;+2 safety/equipment       General transfer comment: increased time and + 2 assist for safety while monitoring BP  Ambulation/Gait Ambulation/Gait assistance: Min assist;+2 safety/equipment Ambulation Distance (Feet): 22 Feet Assistive device: Rolling walker (2 wheeled) Gait Pattern/deviations: Step-to pattern;Decreased step length - right;Decreased step length - left;Decreased stance time - right;Shuffle;Trunk flexed     General Gait Details: cues for sequence, posture and position from RW.  Increased time with min WB tolerated R LE and distance   Tolerated an increased distance   Science writer    Modified Rankin (Stroke Patients Only)       Balance                                            Cognition Arousal/Alertness:  Awake/alert Behavior During Therapy: WFL for tasks assessed/performed Overall Cognitive Status: Within Functional Limits for tasks assessed                                        Exercises      General Comments        Pertinent Vitals/Pain Pain Assessment: 0-10 Pain Score: 6  Pain Location: R knee Pain Descriptors / Indicators: Aching;Sore;Operative site guarding Pain Intervention(s): Monitored during session;Repositioned;Premedicated before session;Ice applied    Home Living                      Prior Function            PT Goals (current goals can now be found in the care plan section) Progress towards PT goals: Progressing toward goals    Frequency    7X/week      PT Plan Current plan remains appropriate    Co-evaluation              AM-PAC PT "6 Clicks" Daily Activity  Outcome Measure  Difficulty turning over in bed (including adjusting bedclothes, sheets and blankets)?: A Lot Difficulty moving from lying on back to sitting on the side of the bed? : A Lot Difficulty sitting down on and standing up  from a chair with arms (e.g., wheelchair, bedside commode, etc,.)?: A Lot Help needed moving to and from a bed to chair (including a wheelchair)?: A Lot Help needed walking in hospital room?: A Lot Help needed climbing 3-5 steps with a railing? : A Lot 6 Click Score: 12    End of Session Equipment Utilized During Treatment: Gait belt;Right knee immobilizer Activity Tolerance: Patient limited by fatigue;Patient limited by pain Patient left: in chair;with family/visitor present;with call bell/phone within reach Nurse Communication: Mobility status PT Visit Diagnosis: Difficulty in walking, not elsewhere classified (R26.2)     Time: 1345-1410 PT Time Calculation (min) (ACUTE ONLY): 25 min  Charges:   1 ta 1 gt                   G Codes:       Rica Koyanagi  PTA [facility]  Acute  Rehab Pager      [phone]

## 2017-10-08 NOTE — Progress Notes (Signed)
   Subjective: 1 Day Post-Op Procedure(s) (LRB): RIGHT TOTAL KNEE ARTHROPLASTY (Right) Patient reports pain as moderate and severe last night. Patient seen in rounds for Dr. Wynelle Link.  Tough night last night. Little better this morning but still hurting. Patient is having problems with pain in the knee, requiring pain medications We will resume therapy today.  Attempted tog et up yesterday but had a lot of pain. Plan is to go Home after hospital stay.  Objective: Vital signs in last 24 hours: Temp:  [97.8 F (36.6 C)-98.8 F (37.1 C)] 98.2 F (36.8 C) (02/26 0652) Pulse Rate:  [41-95] 71 (02/26 0652) Resp:  [10-19] 17 (02/26 0652) BP: (97-158)/(47-96) 116/65 (02/26 0652) SpO2:  [90 %-100 %] 98 % (02/26 0652)  Intake/Output from previous day:  Intake/Output Summary (Last 24 hours) at 10/08/2017 0829 Last data filed at 10/08/2017 0653 Gross per 24 hour  Intake 3972.5 ml  Output 2420 ml  Net 1552.5 ml    Intake/Output this shift: No intake/output data recorded.  Labs: No results for input(s): HGB in the last 72 hours. No results for input(s): WBC, RBC, HCT, PLT in the last 72 hours. Recent Labs    10/08/17 0554  NA 140  K 4.2  CL 107  CO2 24  BUN 12  CREATININE 0.66  GLUCOSE 121*  CALCIUM 8.8*   No results for input(s): LABPT, INR in the last 72 hours.  EXAM General - Patient is Alert and Appropriate Extremity - Neurovascular intact Sensation intact distally Intact pulses distally Dorsiflexion/Plantar flexion intact Dressing - dressing C/D/I Motor Function - intact, moving foot and toes well on exam.  Hemovac pulled without difficulty.  Past Medical History:  Diagnosis Date  . Arthritis   . Asthma   . Diverticulosis   . Endometrial hyperplasia   . Esophageal stricture   . Foot pain   . Fundic gland polyps of stomach, benign 07/10/2015  . Gastritis   . Hemorrhoid   . Hiatal hernia   . Hip pain   . Hyperlipidemia   . Hypertension   . Knee pain   .  Metabolic syndrome   . Obstructive sleep apnea 08/27/2017  . Oxygen deficiency   . Tendonitis of ankle   . Vitamin D deficiency     Assessment/Plan: 1 Day Post-Op Procedure(s) (LRB): RIGHT TOTAL KNEE ARTHROPLASTY (Right) Principal Problem:   OA (osteoarthritis) of knee  Estimated body mass index is 41.52 kg/m as calculated from the following:   Height as of this encounter: 5\' 2"  (1.575 m).   Weight as of this encounter: 103 kg (227 lb). Advance diet Up with therapy Discharge home with home health once meet goals.  DVT Prophylaxis - Xarelto Weight-Bearing as tolerated to right leg D/C O2 and Pulse OX and try on Room Air Asking for sleeping pill for tonight CBC Pending at this time. Preop HGB was 14.5  Arlee Muslim, PA-C Orthopaedic Surgery 10/08/2017, 8:29 AM

## 2017-10-08 NOTE — Progress Notes (Signed)
Physical Therapy Treatment Patient Details Name: Stacie Russell MRN: [id number] DOB: 1951-03-29 Today's Date: 10/08/2017    History of Present Illness Pt s/p R TKR    PT Comments    POD # 1 am session Assisted OOB to amb a limited distance in hallway + 2 assist for safety.  Monitoring BP's.  Performed some TKR TE's followed by ICE.   Follow Up Recommendations  Home health PT     Equipment Recommendations  None recommended by PT    Recommendations for Other Services       Precautions / Restrictions Precautions Precautions: Fall Precaution Comments: instructed on KI use for amb until able to perform 10 active SLR Restrictions Weight Bearing Restrictions: No Other Position/Activity Restrictions: WBAT    Mobility  Bed Mobility Overal bed mobility: Needs Assistance Bed Mobility: Supine to Sit     Supine to sit: Min assist     General bed mobility comments: cues for sequence and use of L LE to self assist  Transfers Overall transfer level: Needs assistance Equipment used: Rolling walker (2 wheeled) Transfers: Sit to/from [facility] Sit to Stand: Min assist;+2 safety/equipment Stand pivot transfers: Min assist;+2 safety/equipment       General transfer comment: increased time and + 2 assist for safety while monitoring BP  Ambulation/Gait Ambulation/Gait assistance: Min assist;+2 safety/equipment Ambulation Distance (Feet): 10 Feet Assistive device: Rolling walker (2 wheeled) Gait Pattern/deviations: Step-to pattern;Decreased step length - right;Decreased step length - left;Decreased stance time - right;Shuffle;Trunk flexed     General Gait Details: cues for sequence, posture and position from RW.  Increased time with min WB tolerated R LE and distance limited by increased pain and mild dizziness   Stairs            Wheelchair Mobility    Modified Rankin (Stroke Patients Only)       Balance                                             Cognition Arousal/Alertness: Awake/alert Behavior During Therapy: WFL for tasks assessed/performed Overall Cognitive Status: Within Functional Limits for tasks assessed                                        Exercises   Total Knee Replacement TE's 10 reps B LE ankle pumps 10 reps towel squeezes 10 reps knee presses 10 reps heel slides   Followed by ICE     General Comments        Pertinent Vitals/Pain Pain Assessment: 0-10 Pain Score: 6  Pain Location: R knee Pain Descriptors / Indicators: Aching;Sore;Operative site guarding Pain Intervention(s): Monitored during session;Repositioned;Premedicated before session;Ice applied    Home Living                      Prior Function            PT Goals (current goals can now be found in the care plan section) Progress towards PT goals: Progressing toward goals    Frequency    7X/week      PT Plan Current plan remains appropriate    Co-evaluation              AM-PAC PT "6 Clicks" Daily Activity  Outcome Measure  Difficulty turning over in bed (including adjusting bedclothes, sheets and blankets)?: A Lot Difficulty moving from lying on back to sitting on the side of the bed? : A Lot Difficulty sitting down on and standing up from a chair with arms (e.g., wheelchair, bedside commode, etc,.)?: A Lot Help needed moving to and from a bed to chair (including a wheelchair)?: A Lot Help needed walking in hospital room?: A Lot Help needed climbing 3-5 steps with a railing? : A Lot 6 Click Score: 12    End of Session Equipment Utilized During Treatment: Gait belt;Right knee immobilizer Activity Tolerance: Patient limited by fatigue;Patient limited by pain Patient left: in chair;with family/visitor present;with call bell/phone within reach Nurse Communication: Mobility status PT Visit Diagnosis: Difficulty in walking, not elsewhere classified (R26.2)     Time:  8372-9021 PT Time Calculation (min) (ACUTE ONLY): 25 min  Charges:  $Gait Training: 8-22 mins $Therapeutic Exercise: 8-22 mins                    G Codes:       Rica Koyanagi  PTA [facility]  Acute  Rehab Pager      [phone]

## 2017-10-08 NOTE — Discharge Instructions (Signed)
° °Dr. Frank Aluisio °Total Joint Specialist °[facility] °[address] °[place], [place] [address] °[phone] ° °TOTAL KNEE REPLACEMENT POSTOPERATIVE DIRECTIONS ° °Knee Rehabilitation, Guidelines Following Surgery  °Results after knee surgery are often greatly improved when you follow the exercise, range of motion and muscle strengthening exercises prescribed by your doctor. Safety measures are also important to protect the knee from further injury. Any time any of these exercises cause you to have increased pain or swelling in your knee joint, decrease the amount until you are comfortable again and slowly increase them. If you have problems or questions, call your caregiver or physical therapist for advice.  ° °HOME CARE INSTRUCTIONS  °Remove items at home which could result in a fall. This includes throw rugs or furniture in walking pathways.  °· ICE to the affected knee every three hours for 30 minutes at a time and then as needed for pain and swelling.  Continue to use ice on the knee for pain and swelling from surgery. You may notice swelling that will progress down to the foot and ankle.  This is normal after surgery.  Elevate the leg when you are not up walking on it.   °· Continue to use the breathing machine which will help keep your temperature down.  It is common for your temperature to cycle up and down following surgery, especially at night when you are not up moving around and exerting yourself.  The breathing machine keeps your lungs expanded and your temperature down. °· Do not place pillow under knee, focus on keeping the knee straight while resting ° °DIET °You may resume your previous home diet once your are discharged from the hospital. ° °DRESSING / WOUND CARE / SHOWERING °You may shower 3 days after surgery, but keep the wounds dry during showering.  You may use an occlusive plastic wrap (Press'n Seal for example), NO SOAKING/SUBMERGING IN THE BATHTUB.  If the bandage gets  wet, change with a clean dry gauze.  If the incision gets wet, pat the wound dry with a clean towel. °You may start showering once you are discharged home but do not submerge the incision under water. Just pat the incision dry and apply a dry gauze dressing on daily. °Change the surgical dressing daily and reapply a dry dressing each time. ° °ACTIVITY °Walk with your walker as instructed. °Use walker as long as suggested by your caregivers. °Avoid periods of inactivity such as sitting longer than an hour when not asleep. This helps prevent blood clots.  °You may resume a sexual relationship in one month or when given the OK by your doctor.  °You may return to work once you are cleared by your doctor.  °Do not drive a car for 6 weeks or until released by you surgeon.  °Do not drive while taking narcotics. ° °WEIGHT BEARING °Weight bearing as tolerated with assist device (walker, cane, etc) as directed, use it as long as suggested by your surgeon or therapist, typically at least 4-6 weeks. ° °POSTOPERATIVE CONSTIPATION PROTOCOL °Constipation - defined medically as fewer than three stools per week and severe constipation as less than one stool per week. ° °One of the most common issues patients have following surgery is constipation.  Even if you have a regular bowel pattern at home, your normal regimen is likely to be disrupted due to multiple reasons following surgery.  Combination of anesthesia, postoperative narcotics, change in appetite and fluid intake all can affect your bowels.    In order to avoid complications following surgery, here are some recommendations in order to help you during your recovery period. ° °Colace (docusate) - Pick up an over-the-counter form of Colace or another stool softener and take twice a day as long as you are requiring postoperative pain medications.  Take with a full glass of water daily.  If you experience loose stools or diarrhea, hold the colace until you stool forms back up.  If  your symptoms do not get better within 1 week or if they get worse, check with your doctor. ° °Dulcolax (bisacodyl) - Pick up over-the-counter and take as directed by the product packaging as needed to assist with the movement of your bowels.  Take with a full glass of water.  Use this product as needed if not relieved by Colace only.  ° °MiraLax (polyethylene glycol) - Pick up over-the-counter to have on hand.  MiraLax is a solution that will increase the amount of water in your bowels to assist with bowel movements.  Take as directed and can mix with a glass of water, juice, soda, coffee, or tea.  Take if you go more than two days without a movement. °Do not use MiraLax more than once per day. Call your doctor if you are still constipated or irregular after using this medication for 7 days in a row. ° °If you continue to have problems with postoperative constipation, please contact the office for further assistance and recommendations.  If you experience "the worst abdominal pain ever" or develop nausea or vomiting, please contact the office immediatly for further recommendations for treatment. ° °ITCHING ° If you experience itching with your medications, try taking only a single pain pill, or even half a pain pill at a time.  You can also use Benadryl over the counter for itching or also to help with sleep.  ° °TED HOSE STOCKINGS °Wear the elastic stockings on both legs for three weeks following surgery during the day but you may remove then at night for sleeping. ° °MEDICATIONS °See your medication summary on the “After Visit Summary” that the nursing staff will review with you prior to discharge.  You may have some home medications which will be placed on hold until you complete the course of blood thinner medication.  It is important for you to complete the blood thinner medication as prescribed by your surgeon.  Continue your approved medications as instructed at time of discharge. ° °PRECAUTIONS °If you  experience chest pain or shortness of breath - call 911 immediately for transfer to the hospital emergency department.  °If you develop a fever greater that 101 F, purulent drainage from wound, increased redness or drainage from wound, foul odor from the wound/dressing, or calf pain - CONTACT YOUR SURGEON.   °                                                °FOLLOW-UP APPOINTMENTS °Make sure you keep all of your appointments after your operation with your surgeon and caregivers. You should call the office at the above phone number and make an appointment for approximately two weeks after the date of your surgery or on the date instructed by your surgeon outlined in the "After Visit Summary". ° ° °RANGE OF MOTION AND STRENGTHENING EXERCISES  °Rehabilitation of the knee is important following a knee injury or   an operation. After just a few days of immobilization, the muscles of the thigh which control the knee become weakened and shrink (atrophy). Knee exercises are designed to build up the tone and strength of the thigh muscles and to improve knee motion. Often times heat used for twenty to thirty minutes before working out will loosen up your tissues and help with improving the range of motion but do not use heat for the first two weeks following surgery. These exercises can be done on a training (exercise) mat, on the floor, on a table or on a bed. Use what ever works the best and is most comfortable for you Knee exercises include:  Leg Lifts - While your knee is still immobilized in a splint or cast, you can do straight leg raises. Lift the leg to 60 degrees, hold for 3 sec, and slowly lower the leg. Repeat 10-20 times 2-3 times daily. Perform this exercise against resistance later as your knee gets better.  Quad and Hamstring Sets - Tighten up the muscle on the front of the thigh (Quad) and hold for 5-10 sec. Repeat this 10-20 times hourly. Hamstring sets are done by pushing the foot backward against an object  and holding for 5-10 sec. Repeat as with quad sets.   Leg Slides: Lying on your back, slowly slide your foot toward your buttocks, bending your knee up off the floor (only go as far as is comfortable). Then slowly slide your foot back down until your leg is flat on the floor again.  Angel Wings: Lying on your back spread your legs to the side as far apart as you can without causing discomfort.  A rehabilitation program following serious knee injuries can speed recovery and prevent re-injury in the future due to weakened muscles. Contact your doctor or a physical therapist for more information on knee rehabilitation.   IF YOU ARE TRANSFERRED TO A SKILLED REHAB FACILITY If the patient is transferred to a skilled rehab facility following release from the hospital, a list of the current medications will be sent to the facility for the patient to continue.  When discharged from the skilled rehab facility, please have the facility set up the patient's [place] prior to being released. Also, the skilled facility will be responsible for providing the patient with their medications at time of release from the facility to include their pain medication, the muscle relaxants, and their blood thinner medication. If the patient is still at the rehab facility at time of the two week follow up appointment, the skilled rehab facility will also need to assist the patient in arranging follow up appointment in our office and any transportation needs.  MAKE SURE YOU:  Understand these instructions.  Get help right away if you are not doing well or get worse.    Pick up stool softner and laxative for home use following surgery while on pain medications. Do not submerge incision under water. Please use good hand washing techniques while changing dressing each day. May shower starting three days after surgery. Please use a clean towel to pat the incision dry following showers. Continue to use ice for  pain and swelling after surgery. Do not use any lotions or creams on the incision until instructed by your surgeon.  Take a full dose enteric coated 325 mg Aspirin twice a day for three weeks, then reduce back to the 81 mg Aspirin daily at home.

## 2017-10-09 LAB — CBC
HCT: 35 % — ABNORMAL LOW (ref 36.0–46.0)
Hemoglobin: 11.6 g/dL — ABNORMAL LOW (ref 12.0–15.0)
MCH: 32 pg (ref 26.0–34.0)
MCHC: 33.1 g/dL (ref 30.0–36.0)
MCV: 96.7 fL (ref 78.0–100.0)
PLATELETS: 207 10*3/uL (ref 150–400)
RBC: 3.62 MIL/uL — ABNORMAL LOW (ref 3.87–5.11)
RDW: 13.2 % (ref 11.5–15.5)
WBC: 11.6 10*3/uL — AB (ref 4.0–10.5)

## 2017-10-09 LAB — BASIC METABOLIC PANEL
ANION GAP: 6 (ref 5–15)
BUN: 16 mg/dL (ref 6–20)
CALCIUM: 8.7 mg/dL — AB (ref 8.9–10.3)
CO2: 29 mmol/L (ref 22–32)
Chloride: 104 mmol/L (ref 101–111)
Creatinine, Ser: 0.76 mg/dL (ref 0.44–1.00)
GFR calc Af Amer: >60 mL/min (ref >60)
Glucose, Bld: 103 mg/dL — ABNORMAL HIGH (ref 65–99)
Potassium: 3.8 mmol/L (ref 3.5–5.1)
SODIUM: 139 mmol/L (ref 135–145)

## 2017-10-09 NOTE — Progress Notes (Signed)
   Subjective: 2 Days Post-Op Procedure(s) (LRB): RIGHT TOTAL KNEE ARTHROPLASTY (Right) Patient reports pain as mild.   Patient seen in rounds by Dr. Wynelle Link. Patient is well, but has had some minor complaints of pain in the knee, requiring pain medications Patient is ready to go home  May need two sessions of therapy.  Objective: Vital signs in last 24 hours: Temp:  [97.5 F (36.4 C)-98.5 F (36.9 C)] 98.1 F (36.7 C) (02/26 2245) Pulse Rate:  [80-86] 86 (02/26 2245) Resp:  [15-16] 15 (02/26 2245) BP: (123-148)/(67-71) 148/71 (02/26 2245) SpO2:  [95 %-96 %] 96 % (02/26 2245)  Intake/Output from previous day:  Intake/Output Summary (Last 24 hours) at 10/09/2017 0715 Last data filed at 10/09/2017 0200 Gross per 24 hour  Intake 1605 ml  Output 2000 ml  Net -395 ml    Intake/Output this shift: No intake/output data recorded.  Labs: Recent Labs    10/08/17 0554 10/09/17 0542  HGB 11.4* 11.6*   Recent Labs    10/08/17 0554 10/09/17 0542  WBC 12.3* 11.6*  RBC 3.65* 3.62*  HCT 34.0* 35.0*  PLT 219 207   Recent Labs    10/08/17 0554 10/09/17 0542  NA 140 139  K 4.2 3.8  CL 107 104  CO2 24 29  BUN 12 16  CREATININE 0.66 0.76  GLUCOSE 121* 103*  CALCIUM 8.8* 8.7*   No results for input(s): LABPT, INR in the last 72 hours.  EXAM: General - Patient is Alert, Appropriate and Oriented Extremity - Neurovascular intact Sensation intact distally Intact pulses distally Dorsiflexion/Plantar flexion intact Incision - clean, dry Motor Function - intact, moving foot and toes well on exam.   Assessment/Plan: 2 Days Post-Op Procedure(s) (LRB): RIGHT TOTAL KNEE ARTHROPLASTY (Right) Procedure(s) (LRB): RIGHT TOTAL KNEE ARTHROPLASTY (Right) Past Medical History:  Diagnosis Date  . Arthritis   . Asthma   . Diverticulosis   . Endometrial hyperplasia   . Esophageal stricture   . Foot pain   . Fundic gland polyps of stomach, benign 07/10/2015  . Gastritis   .  Hemorrhoid   . Hiatal hernia   . Hip pain   . Hyperlipidemia   . Hypertension   . Knee pain   . Metabolic syndrome   . Obstructive sleep apnea 08/27/2017  . Oxygen deficiency   . Tendonitis of ankle   . Vitamin D deficiency    Principal Problem:   OA (osteoarthritis) of knee  Estimated body mass index is 41.52 kg/m as calculated from the following:   Height as of this encounter: 5\' 2"  (1.575 m).   Weight as of this encounter: 103 kg (227 lb). Up with therapy Diet - Cardiac diet Follow up - in 2 weeks Activity - WBAT Disposition - Home Condition Upon Discharge - Stable D/C Meds - See DC Summary DVT Prophylaxis - Xarelto  Arlee Muslim, PA-C Orthopaedic Surgery 10/09/2017, 7:15 AM

## 2017-10-09 NOTE — Progress Notes (Signed)
Physical Therapy Treatment Patient Details Name: Stacie Russell MRN: [id number] DOB: 1950/12/16 Today's Date: 10/09/2017    History of Present Illness Pt s/p R TKR    PT Comments    POD # 2 With spouse present, practiced stairs and reviewed HEP.  Follow Up Recommendations  Home health PT     Equipment Recommendations  None recommended by PT    Recommendations for Other Services       Precautions / Restrictions Precautions Precaution Comments: Instructed to wear KI for stairs Restrictions Weight Bearing Restrictions: No    Mobility  Bed Mobility               General bed mobility comments: OOB in recliner  Transfers Overall transfer level: Needs assistance Equipment used: Rolling walker (2 wheeled) Transfers: Sit to/from [employer] Transfers Sit to Stand: Supervision Stand pivot transfers: Supervision;Min guard       General transfer comment: 25% VC's on safety with turns  Ambulation/Gait Ambulation/Gait assistance: Supervision;Min guard Ambulation Distance (Feet): 35 Feet Assistive device: Rolling walker (2 wheeled) Gait Pattern/deviations: Step-to pattern;Decreased step length - right;Decreased step length - left;Decreased stance time - right Gait velocity: decr   General Gait Details: tolerated an increased distance   Stairs Stairs: Yes   Stair Management: One rail Left;With crutches Number of Stairs: 4 General stair comments: one rail and one crutch up forward with spouse present for "hands on" instruction  Wheelchair Mobility    Modified Rankin (Stroke Patients Only)       Balance                                            Cognition Arousal/Alertness: Awake/alert Behavior During Therapy: WFL for tasks assessed/performed Overall Cognitive Status: Within Functional Limits for tasks assessed                                        Exercises      General Comments        Pertinent  Vitals/Pain Pain Assessment: 0-10 Pain Score: 8  Pain Location: R knee Pain Descriptors / Indicators: Aching;Sore;Operative site guarding Pain Intervention(s): Limited activity within patient's tolerance;Monitored during session;Repositioned;Ice applied    Home Living                      Prior Function            PT Goals (current goals can now be found in the care plan section) Progress towards PT goals: Progressing toward goals    Frequency    7X/week      PT Plan Current plan remains appropriate    Co-evaluation              AM-PAC PT "6 Clicks" Daily Activity  Outcome Measure  Difficulty turning over in bed (including adjusting bedclothes, sheets and blankets)?: A Little Difficulty moving from lying on back to sitting on the side of the bed? : A Little Difficulty sitting down on and standing up from a chair with arms (e.g., wheelchair, bedside commode, etc,.)?: A Little Help needed moving to and from a bed to chair (including a wheelchair)?: A Little Help needed walking in hospital room?: A Little Help needed climbing 3-5 steps with a railing? : A Little 6 Click  Score: 18    End of Session Equipment Utilized During Treatment: Gait belt;Right knee immobilizer Activity Tolerance: Patient tolerated treatment well Patient left: in chair;with family/visitor present;with call bell/phone within reach Nurse Communication: (pt ready for D/C to home) PT Visit Diagnosis: Difficulty in walking, not elsewhere classified (R26.2)     Time: 5670-1410 PT Time Calculation (min) (ACUTE ONLY): 30 min  Charges:  $Gait Training: 8-22 mins $Therapeutic Activity: 8-22 mins                    G Codes:       Rica Koyanagi  PTA [facility]  Acute  Rehab Pager      [phone]

## 2017-10-10 DIAGNOSIS — Z7951 Long term (current) use of inhaled steroids: Secondary | ICD-10-CM | POA: Diagnosis not present

## 2017-10-10 DIAGNOSIS — Z96652 Presence of left artificial knee joint: Secondary | ICD-10-CM | POA: Diagnosis not present

## 2017-10-10 DIAGNOSIS — Z79891 Long term (current) use of opiate analgesic: Secondary | ICD-10-CM | POA: Diagnosis not present

## 2017-10-10 DIAGNOSIS — Z7982 Long term (current) use of aspirin: Secondary | ICD-10-CM | POA: Diagnosis not present

## 2017-10-10 DIAGNOSIS — Z471 Aftercare following joint replacement surgery: Secondary | ICD-10-CM | POA: Diagnosis not present

## 2017-10-11 DIAGNOSIS — Z7951 Long term (current) use of inhaled steroids: Secondary | ICD-10-CM | POA: Diagnosis not present

## 2017-10-11 DIAGNOSIS — Z96652 Presence of left artificial knee joint: Secondary | ICD-10-CM | POA: Diagnosis not present

## 2017-10-11 DIAGNOSIS — Z471 Aftercare following joint replacement surgery: Secondary | ICD-10-CM | POA: Diagnosis not present

## 2017-10-11 DIAGNOSIS — Z79891 Long term (current) use of opiate analgesic: Secondary | ICD-10-CM | POA: Diagnosis not present

## 2017-10-11 DIAGNOSIS — Z7982 Long term (current) use of aspirin: Secondary | ICD-10-CM | POA: Diagnosis not present

## 2017-10-14 DIAGNOSIS — Z471 Aftercare following joint replacement surgery: Secondary | ICD-10-CM | POA: Diagnosis not present

## 2017-10-14 DIAGNOSIS — Z7951 Long term (current) use of inhaled steroids: Secondary | ICD-10-CM | POA: Diagnosis not present

## 2017-10-14 DIAGNOSIS — Z96652 Presence of left artificial knee joint: Secondary | ICD-10-CM | POA: Diagnosis not present

## 2017-10-14 DIAGNOSIS — Z79891 Long term (current) use of opiate analgesic: Secondary | ICD-10-CM | POA: Diagnosis not present

## 2017-10-14 DIAGNOSIS — Z7982 Long term (current) use of aspirin: Secondary | ICD-10-CM | POA: Diagnosis not present

## 2017-10-16 DIAGNOSIS — Z7951 Long term (current) use of inhaled steroids: Secondary | ICD-10-CM | POA: Diagnosis not present

## 2017-10-16 DIAGNOSIS — Z7982 Long term (current) use of aspirin: Secondary | ICD-10-CM | POA: Diagnosis not present

## 2017-10-16 DIAGNOSIS — Z471 Aftercare following joint replacement surgery: Secondary | ICD-10-CM | POA: Diagnosis not present

## 2017-10-16 DIAGNOSIS — Z96652 Presence of left artificial knee joint: Secondary | ICD-10-CM | POA: Diagnosis not present

## 2017-10-16 DIAGNOSIS — Z79891 Long term (current) use of opiate analgesic: Secondary | ICD-10-CM | POA: Diagnosis not present

## 2017-10-18 ENCOUNTER — Other Ambulatory Visit: Payer: Self-pay | Admitting: *Deleted

## 2017-10-18 DIAGNOSIS — Z7982 Long term (current) use of aspirin: Secondary | ICD-10-CM | POA: Diagnosis not present

## 2017-10-18 DIAGNOSIS — Z96652 Presence of left artificial knee joint: Secondary | ICD-10-CM | POA: Diagnosis not present

## 2017-10-18 DIAGNOSIS — Z7951 Long term (current) use of inhaled steroids: Secondary | ICD-10-CM | POA: Diagnosis not present

## 2017-10-18 DIAGNOSIS — Z79891 Long term (current) use of opiate analgesic: Secondary | ICD-10-CM | POA: Diagnosis not present

## 2017-10-18 DIAGNOSIS — Z471 Aftercare following joint replacement surgery: Secondary | ICD-10-CM | POA: Diagnosis not present

## 2017-10-18 MED ORDER — FLUCONAZOLE 150 MG PO TABS: ORAL_TABLET | ORAL | 1 refills | Status: DC

## 2017-10-18 MED ORDER — TEMAZEPAM 15 MG PO CAPS: ORAL_CAPSULE | ORAL | 0 refills | Status: DC

## 2017-10-21 ENCOUNTER — Other Ambulatory Visit: Payer: Self-pay | Admitting: *Deleted

## 2017-10-21 DIAGNOSIS — Z96652 Presence of left artificial knee joint: Secondary | ICD-10-CM | POA: Diagnosis not present

## 2017-10-21 DIAGNOSIS — Z7982 Long term (current) use of aspirin: Secondary | ICD-10-CM | POA: Diagnosis not present

## 2017-10-21 DIAGNOSIS — Z79891 Long term (current) use of opiate analgesic: Secondary | ICD-10-CM | POA: Diagnosis not present

## 2017-10-21 DIAGNOSIS — Z471 Aftercare following joint replacement surgery: Secondary | ICD-10-CM | POA: Diagnosis not present

## 2017-10-21 DIAGNOSIS — Z7951 Long term (current) use of inhaled steroids: Secondary | ICD-10-CM | POA: Diagnosis not present

## 2017-10-21 MED ORDER — TEMAZEPAM 15 MG PO CAPS: 15.0000 mg | ORAL_CAPSULE | Freq: Every day | ORAL | 0 refills | Status: DC

## 2017-10-24 DIAGNOSIS — Z471 Aftercare following joint replacement surgery: Secondary | ICD-10-CM | POA: Diagnosis not present

## 2017-10-24 DIAGNOSIS — Z79891 Long term (current) use of opiate analgesic: Secondary | ICD-10-CM | POA: Diagnosis not present

## 2017-10-24 DIAGNOSIS — Z96652 Presence of left artificial knee joint: Secondary | ICD-10-CM | POA: Diagnosis not present

## 2017-10-24 DIAGNOSIS — Z7951 Long term (current) use of inhaled steroids: Secondary | ICD-10-CM | POA: Diagnosis not present

## 2017-10-24 DIAGNOSIS — Z7982 Long term (current) use of aspirin: Secondary | ICD-10-CM | POA: Diagnosis not present

## 2017-10-29 DIAGNOSIS — Z96651 Presence of right artificial knee joint: Secondary | ICD-10-CM | POA: Diagnosis not present

## 2017-10-29 DIAGNOSIS — Z471 Aftercare following joint replacement surgery: Secondary | ICD-10-CM | POA: Diagnosis not present

## 2017-10-29 DIAGNOSIS — R262 Difficulty in walking, not elsewhere classified: Secondary | ICD-10-CM | POA: Diagnosis not present

## 2017-10-29 DIAGNOSIS — M25561 Pain in right knee: Secondary | ICD-10-CM | POA: Diagnosis not present

## 2017-11-01 DIAGNOSIS — R262 Difficulty in walking, not elsewhere classified: Secondary | ICD-10-CM | POA: Diagnosis not present

## 2017-11-01 DIAGNOSIS — M25561 Pain in right knee: Secondary | ICD-10-CM | POA: Diagnosis not present

## 2017-11-01 DIAGNOSIS — Z96651 Presence of right artificial knee joint: Secondary | ICD-10-CM | POA: Diagnosis not present

## 2017-11-01 DIAGNOSIS — Z471 Aftercare following joint replacement surgery: Secondary | ICD-10-CM | POA: Diagnosis not present

## 2017-11-05 DIAGNOSIS — R262 Difficulty in walking, not elsewhere classified: Secondary | ICD-10-CM | POA: Diagnosis not present

## 2017-11-05 DIAGNOSIS — Z96651 Presence of right artificial knee joint: Secondary | ICD-10-CM | POA: Diagnosis not present

## 2017-11-05 DIAGNOSIS — M25561 Pain in right knee: Secondary | ICD-10-CM | POA: Diagnosis not present

## 2017-11-05 DIAGNOSIS — Z471 Aftercare following joint replacement surgery: Secondary | ICD-10-CM | POA: Diagnosis not present

## 2017-11-07 ENCOUNTER — Ambulatory Visit: Payer: Medicare Other | Admitting: Cardiovascular Disease

## 2017-11-12 DIAGNOSIS — Z471 Aftercare following joint replacement surgery: Secondary | ICD-10-CM | POA: Diagnosis not present

## 2017-11-12 DIAGNOSIS — Z96651 Presence of right artificial knee joint: Secondary | ICD-10-CM | POA: Diagnosis not present

## 2017-11-18 ENCOUNTER — Other Ambulatory Visit: Payer: Self-pay | Admitting: Family Medicine

## 2017-11-25 ENCOUNTER — Encounter: Payer: Self-pay | Admitting: Internal Medicine

## 2017-11-25 ENCOUNTER — Ambulatory Visit: Payer: Medicare Other | Admitting: Internal Medicine

## 2017-11-25 ENCOUNTER — Other Ambulatory Visit (INDEPENDENT_AMBULATORY_CARE_PROVIDER_SITE_OTHER): Payer: Medicare Other

## 2017-11-25 VITALS — BP 104/60 | HR 72 | Ht 60.63 in | Wt 213.1 lb

## 2017-11-25 DIAGNOSIS — R5381 Other malaise: Secondary | ICD-10-CM | POA: Diagnosis not present

## 2017-11-25 DIAGNOSIS — R1310 Dysphagia, unspecified: Secondary | ICD-10-CM

## 2017-11-25 DIAGNOSIS — R6881 Early satiety: Secondary | ICD-10-CM | POA: Diagnosis not present

## 2017-11-25 DIAGNOSIS — G4733 Obstructive sleep apnea (adult) (pediatric): Secondary | ICD-10-CM

## 2017-11-25 DIAGNOSIS — K5909 Other constipation: Secondary | ICD-10-CM | POA: Diagnosis not present

## 2017-11-25 DIAGNOSIS — R5382 Chronic fatigue, unspecified: Secondary | ICD-10-CM

## 2017-11-25 DIAGNOSIS — R1319 Other dysphagia: Secondary | ICD-10-CM

## 2017-11-25 LAB — CBC WITH DIFFERENTIAL/PLATELET
BASOS PCT: 0.5 % (ref 0.0–3.0)
Basophils Absolute: 0 10*3/uL (ref 0.0–0.1)
EOS PCT: 2.8 % (ref 0.0–5.0)
Eosinophils Absolute: 0.2 10*3/uL (ref 0.0–0.7)
HEMATOCRIT: 40.6 % (ref 36.0–46.0)
HEMOGLOBIN: 13.5 g/dL (ref 12.0–15.0)
Lymphocytes Relative: 25 % (ref 12.0–46.0)
Lymphs Abs: 2.1 10*3/uL (ref 0.7–4.0)
MCHC: 33.3 g/dL (ref 30.0–36.0)
MCV: 94.8 fl (ref 78.0–100.0)
MONO ABS: 0.6 10*3/uL (ref 0.1–1.0)
Monocytes Relative: 7.3 % (ref 3.0–12.0)
Neutro Abs: 5.4 10*3/uL (ref 1.4–7.7)
Neutrophils Relative %: 64.4 % (ref 43.0–77.0)
Platelets: 257 10*3/uL (ref 150.0–400.0)
RBC: 4.28 Mil/uL (ref 3.87–5.11)
RDW: 13.2 % (ref 11.5–15.5)
WBC: 8.4 10*3/uL (ref 4.0–10.5)

## 2017-11-25 NOTE — Patient Instructions (Addendum)
Your provider has requested that you go to the basement level for lab work before leaving today. Press "B" on the elevator. The lab is located at the first door on the left as you exit the elevator. CBC/diff   Stay on current medicines.  Please follow up with Dr Laurance Flatten about your sleep study results.   You have been scheduled for an endoscopy. Please follow written instructions given to you at your visit today. If you use inhalers (even only as needed), please bring them with you on the day of your procedure.   Normal BMI (Body Mass Index- based on height and weight) is between 23 and 30. Your BMI today is Body mass index is 40.76 kg/m. Marland Kitchen Please consider follow up  regarding your BMI with your Primary Care Provider.   I appreciate the opportunity to care for you. Silvano Rusk, MD, [facility]

## 2017-11-25 NOTE — Progress Notes (Signed)
KARE DADO 67 y.o. [birth year] [id number]  Assessment & Plan:   Encounter Diagnoses  Name Primary?  . Esophageal dysphagia Yes  . Early satiety   . Chronic fatigue and malaise   . OSA (obstructive sleep apnea)   . Chronic constipation     Will schedule for upper GI endoscopy with likely esophageal dilation this will evaluate dysphagia and early satiety and also allow treatment of the dysphagia  The risks and benefits as well as alternatives of endoscopic procedure(s) have been discussed and reviewed. All questions answered. The patient agrees to proceed.  Consider changing PPI pending that exam prior EGD reports,  Citrucel + stool softe to continue to treat constipation if that worsens ner - maybe MiraLax She is dissatisfied with her sleep apnea care.  I reviewed that with her a little bit and it appears she needs some sort of adjustment to her BiPAP and it looks like they are figuring things out but not quickly necessarily.  I think the most likely cause left as a cause of her chronic fatigue and malaise which is quite disturbing to her is inadequately treated sleep apnea.    I appreciate the opportunity to care for this patient. CH:ENIDP, Estella Husk, MD  Subjective:   Chief Complaint: Dysphagia with chest pain/heartburn fatigue constipation and recurrent rectal bleeding  HPI The patient is a 67 year old woman here with her husband, she is known to me over time feeling full and neck pressure after eating, and is here with complaints of recurrent dysphagia and chest pain or burning, a background of chronic fatigue which is very frustrating and since taking narcotics at times after recent right knee replacement having some recurrence of slight rectal bleeding.  She was seen in March 2018, she had had a heme positive stool but she was discovered to have an anal fissure and hemorrhoids and she treated those and they were better.  I also thought she had IBS constipation  predominant.  She had been using Citrucel with relief but when she had her knee replacement in February she went with longer periods of time without defecating and took stool softener magnesium supplementation with milk of magnesia and also had some diarrhea and has had a little bit of rectal bleeding and anal soreness since then.  She feels better with regards to that though she is still having irregular defecation but says she was always that way.  Last colonoscopy 06/2015 with diverticulosis and next procedure due in 2021 given family history of colon cancer. She has also had dysphagia problems then and underwent an  EGD - 58 Fr Maloney dilation - no stricture, fundic gland polyps were also seen.  As best I can tell the dilation helped.  Over the past year she has been extremely frustrating fatigue.  She has had cardiac workups for the chest pain and fatigue, she has not been significantly anemic at any point, thyroid functions have been okay but she has tested positive for sleep apnea and had more than one sleep study but says she cannot get good communication from the provider, Dr. Claiborne Billings and is frustrated and she just sort of gave up.  Initially she was on CPAP she was retested and they determined she needed BiPAP and then a repeat test on BiPAP in January of this year showed she was still not adequately treated.  She does have some daytime somnolence and headaches.  Husband attest to that she downplays that some.  In addition to her esophageal  symptoms she has early satiety and says food does not taste good.  She does not sleep well and that is probably multifactorial and somewhat primary at times.  She received a prescription for temazepam at 15 mg but that left her with a hangover and she wanted a lower dose but the insurance would not pay for it.  She was no buspirone in past without he;lp for anxiety or early satiety.  No Known Allergies Current Meds  Medication Sig  . acetaminophen (TYLENOL)  500 MG tablet Take 1,000 mg by mouth 2 (two) times daily as needed for moderate pain or headache.  . albuterol (PROVENTIL HFA;VENTOLIN HFA) 108 (90 Base) MCG/ACT inhaler Inhale 2 puffs into the lungs every 6 (six) hours as needed for wheezing or shortness of breath.  Marland Kitchen aspirin EC 81 MG tablet Take 81 mg by mouth daily.  . budesonide-formoterol (SYMBICORT) 80-4.5 MCG/ACT inhaler Inhale 2 puffs into the lungs 2 (two) times daily. (Patient taking differently: Inhale 2 puffs into the lungs as needed. )  . Cholecalciferol (VITAMIN D3) 2000 units TABS Take 1 tablet by mouth daily.  . furosemide (LASIX) 20 MG tablet Take 1 tablet (20 mg total) by mouth daily as needed. (Patient taking differently: Take 20 mg by mouth daily as needed for fluid or edema. )  . levothyroxine (SYNTHROID, LEVOTHROID) 25 MCG tablet TAKE 1 TABLET BY MOUTH  DAILY BEFORE BREAKFAST  . losartan-hydrochlorothiazide (HYZAAR) 100-12.5 MG tablet TAKE 1 TABLET BY MOUTH  DAILY  . Omega-3 Fatty Acids (FISH OIL) 1000 MG CAPS Take 2 capsules by mouth daily.  Marland Kitchen oxybutynin (DITROPAN) 5 MG tablet Take 10 mg by mouth 2 (two) times daily.  Marland Kitchen oxyCODONE (OXY IR/ROXICODONE) 5 MG immediate release tablet Take 1-2 tablets (5-10 mg total) by mouth every 4 (four) hours as needed for moderate pain or severe pain.  . pantoprazole (PROTONIX) 40 MG tablet TAKE ONE TABLET BY MOUTH  ONCE DAILY BEFORE BREAKFAST   Past Medical History:  Diagnosis Date  . Arthritis   . Asthma   . Diverticulosis   . Endometrial hyperplasia   . Esophageal stricture   . Foot pain   . Fundic gland polyps of stomach, benign 07/10/2015  . Gastritis   . Hemorrhoid   . Hiatal hernia   . Hip pain   . Hyperlipidemia   . Hypertension   . Knee pain   . Metabolic syndrome   . Obstructive sleep apnea 08/27/2017  . Oxygen deficiency   . Tendonitis of ankle   . Vitamin D deficiency    Past Surgical History:  Procedure Laterality Date  . ANAL FISSURE REPAIR    . CATARACT  EXTRACTION  May 2009  . COLONOSCOPY  12/26/07  . ESOPHAGOGASTRODUODENOSCOPY  12/26/07, 05/13/08  . EYE SURGERY     bilateral cataract with lens implants  . KNEE CARTILAGE SURGERY Right Sept 1999  . 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 UMBILICAL HERNIA REPAIR  05/20/06   Social History   Social History Narrative   Married and retired   No EtOH, tobacco or drug use   family history includes Breast cancer in her sister and sister; Cancer in her mother; Colon cancer in her mother; Heart attack in her father; Heart disease in her sister.   Review of Systems Recovering from right total knee replacement w/o problems (had  in 09/2017)  Objective:   Physical Exam @BP  104/60 (BP Location: Left Arm, Patient Position: Sitting, Cuff Size: Normal)   Pulse 72 Comment: irregular  Ht 5' 0.63" (1.54 m)   Wt 213 lb 2 oz (96.7 kg)   BMI 40.76 kg/m @  General:  NAD, obese Neck -   Supple without thyromegaly or mass Eyes:   anicteric Lungs:  clear Heart::  S1S2 no rubs, murmurs or gallops Abdomen:  soft and nontender, BS+ Ext:   no edema, cyanosis or clubbing  Patti Martinique, CMA present.  Rectal  Mild induration post - formed brown stool Sl dec anal tone    Data Reviewed:   Sleep studies prior EGD reports and pathology Labs in EMR 2019

## 2017-11-27 ENCOUNTER — Ambulatory Visit: Payer: Medicare Other | Admitting: Family Medicine

## 2017-11-27 ENCOUNTER — Encounter: Payer: Self-pay | Admitting: Internal Medicine

## 2017-11-28 ENCOUNTER — Encounter: Payer: Self-pay | Admitting: Internal Medicine

## 2017-11-28 ENCOUNTER — Other Ambulatory Visit: Payer: Self-pay

## 2017-11-28 ENCOUNTER — Ambulatory Visit (AMBULATORY_SURGERY_CENTER): Payer: Medicare Other | Admitting: Internal Medicine

## 2017-11-28 ENCOUNTER — Ambulatory Visit: Payer: Medicare Other | Admitting: Family Medicine

## 2017-11-28 VITALS — BP 169/84 | HR 70 | Temp 98.0°F | Resp 11 | Ht 60.0 in | Wt 213.0 lb

## 2017-11-28 DIAGNOSIS — K317 Polyp of stomach and duodenum: Secondary | ICD-10-CM

## 2017-11-28 DIAGNOSIS — R4702 Dysphasia: Secondary | ICD-10-CM | POA: Diagnosis present

## 2017-11-28 DIAGNOSIS — K228 Other specified diseases of esophagus: Secondary | ICD-10-CM

## 2017-11-28 DIAGNOSIS — K219 Gastro-esophageal reflux disease without esophagitis: Secondary | ICD-10-CM | POA: Diagnosis not present

## 2017-11-28 MED ORDER — SODIUM CHLORIDE 0.9 % IV SOLN: 500.0000 mL | Freq: Once | INTRAVENOUS | Status: DC

## 2017-11-28 NOTE — Progress Notes (Signed)
Called to room to assist during endoscopic procedure.  Patient ID and intended procedure confirmed with present staff. Received instructions for my participation in the procedure from the performing physician.  

## 2017-11-28 NOTE — Patient Instructions (Addendum)
I dilated the esophagus like I did in 2016. Hope that helps again. If not - let me know.  Your blood count was normal.  Please be sure to follow-up with Dr. Laurance Flatten and your sleep apnea concerns.  I appreciate the opportunity to care for you. Gatha Mayer, MD, [facility]  Post Esophogeal Dilation diet handout given  YOU HAD AN ENDOSCOPIC PROCEDURE TODAY AT [facility]:   Refer to the procedure report that was given to you for any specific questions about what was found during the examination.  If the procedure report does not answer your questions, please call your gastroenterologist to clarify.  If you requested that your care partner not be given the details of your procedure findings, then the procedure report has been included in a sealed envelope for you to review at your convenience later.  YOU SHOULD EXPECT: Some feelings of bloating in the abdomen. Passage of more gas than usual.  Walking can help get rid of the air that was put into your GI tract during the procedure and reduce the bloating. If you had a lower endoscopy (such as a colonoscopy or flexible sigmoidoscopy) you may notice spotting of blood in your stool or on the toilet paper. If you underwent a bowel prep for your procedure, you may not have a normal bowel movement for a few days.  Please Note:  You might notice some irritation and congestion in your nose or some drainage.  This is from the oxygen used during your procedure.  There is no need for concern and it should clear up in a day or so.  SYMPTOMS TO REPORT IMMEDIATELY:    Following upper endoscopy (EGD)  Vomiting of blood or coffee ground material  New chest pain or pain under the shoulder blades  Painful or persistently difficult swallowing  New shortness of breath  Fever of 100F or higher  Black, tarry-looking stools  For urgent or emergent issues, a gastroenterologist can be reached at any hour by calling [phone].   DIET:  We  do recommend a small meal at first, but then you may proceed to your regular diet.  Drink plenty of fluids but you should avoid alcoholic beverages for 24 hours.  ACTIVITY:  You should plan to take it easy for the rest of today and you should NOT DRIVE or use heavy machinery until tomorrow (because of the sedation medicines used during the test).    FOLLOW UP: Our staff will call the number listed on your records the next business day following your procedure to check on you and address any questions or concerns that you may have regarding the information given to you following your procedure. If we do not reach you, we will leave a message.  However, if you are feeling well and you are not experiencing any problems, there is no need to return our call.  We will assume that you have returned to your regular daily activities without incident.  If any biopsies were taken you will be contacted by phone or by letter within the next 1-3 weeks.  Please call us at [phone] if you have not heard about the biopsies in 3 weeks.    SIGNATURES/CONFIDENTIALITY: You and/or your care partner have signed paperwork which will be entered into your electronic medical record.  These signatures attest to the fact that that the information above on your After Visit Summary has been reviewed and is understood.  Full responsibility of the  confidentiality of this discharge information lies with you and/or your care-partner. 

## 2017-11-28 NOTE — Progress Notes (Signed)
Report to PACU, RN, vss, BBS= Clear.  

## 2017-11-28 NOTE — Op Note (Signed)
[place] Patient Name: Stacie Russell Procedure Date: 11/28/2017 11:06 AM MRN: [id number] Endoscopist: Gatha Mayer , MD Age: 67 Referring MD:  Date of Birth: 1950/12/01 Gender: Female Account #: [id number] Procedure:                Upper GI endoscopy Indications:              Dysphagia Medicines:                Propofol per Anesthesia, Monitored Anesthesia Care Procedure:                Pre-Anesthesia Assessment:                           - Prior to the procedure, a History and Physical                            was performed, and patient medications and                            allergies were reviewed. The patient's tolerance of                            previous anesthesia was also reviewed. The risks                            and benefits of the procedure and the sedation                            options and risks were discussed with the patient.                            All questions were answered, and informed consent                            was obtained. Prior Anticoagulants: The patient has                            taken no previous anticoagulant or antiplatelet                            agents. ASA Grade Assessment: III - A patient with                            severe systemic disease. After reviewing the risks                            and benefits, the patient was deemed in                            satisfactory condition to undergo the procedure.                           After obtaining informed consent, the endoscope was  passed under direct vision. Throughout the                            procedure, the patient's blood pressure, pulse, and                            oxygen saturations were monitored continuously. The                            Endoscope was introduced through the mouth, and                            advanced to the second part of duodenum. The upper                            GI endoscopy  was accomplished without difficulty.                            The patient tolerated the procedure well. Scope In: Scope Out: Findings:                 The examined esophagus was mildly tortuous. The                            scope was withdrawn. Dilation was performed with a                            Maloney dilator with mild resistance at 66 Fr.                            Estimated blood loss: none.                           Multiple small semi-sessile polyps with no stigmata                            of recent bleeding were found in the gastric fundus                            and in the gastric body.                           The exam was otherwise without abnormality.                           The cardia and gastric fundus were normal on                            retroflexion. Complications:            No immediate complications. Estimated Blood Loss:     Estimated blood loss: none. Impression:               - Tortuous esophagus.                           -  Multiple gastric polyps. KNOWN BENIGN FUNDIC                            GLAND POLYPS                           - The examination was otherwise normal.                           - No specimens collected. Recommendation:           - Patient has a contact number available for                            emergencies. The signs and symptoms of potential                            delayed complications were discussed with the                            patient. Return to normal activities tomorrow.                            Written discharge instructions were provided to the                            patient.                           - Clear liquids x 1 hour then soft foods rest of                            day. Start prior diet tomorrow.                           - Continue present medications.                           - Repeat upper endoscopy PRN for retreatment. Gatha Mayer, MD 11/28/2017 11:25:35 AM This report has  been signed electronically.

## 2017-11-28 NOTE — Progress Notes (Signed)
Pt's states no medical or surgical changes since previsit or office visit. 

## 2017-11-28 NOTE — Progress Notes (Signed)
NL - informed at EGD

## 2017-12-02 ENCOUNTER — Telehealth: Payer: Self-pay | Admitting: *Deleted

## 2017-12-02 NOTE — Telephone Encounter (Signed)
  Follow up Call-  Call back number 11/28/2017 07/04/2015  Post procedure Call Back phone  # [phone]  Permission to leave phone message Yes Yes  Some recent data might be hidden     Patient questions:  Do you have a fever, pain , or abdominal swelling? No. Pain Score  0 *  Have you tolerated food without any problems? Yes.    Have you been able to return to your normal activities? Yes.    Do you have any questions about your discharge instructions: Diet   No. Medications  No. Follow up visit  No.  Do you have questions or concerns about your Care? No.  Actions: * If pain score is 4 or above: No action needed, pain <4.

## 2017-12-05 ENCOUNTER — Ambulatory Visit: Payer: Medicare Other | Admitting: Podiatry

## 2017-12-05 ENCOUNTER — Encounter: Payer: Self-pay | Admitting: Podiatry

## 2017-12-05 DIAGNOSIS — M216X9 Other acquired deformities of unspecified foot: Secondary | ICD-10-CM

## 2017-12-05 DIAGNOSIS — L84 Corns and callosities: Secondary | ICD-10-CM

## 2017-12-05 DIAGNOSIS — M79672 Pain in left foot: Secondary | ICD-10-CM

## 2017-12-05 NOTE — Progress Notes (Signed)
Subjective:   Patient ID: Stacie Russell, female   DOB: 67 y.o.   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  Patient will be seen back by ped orthotist to dispense orthotics

## 2017-12-10 ENCOUNTER — Ambulatory Visit: Payer: Medicare Other | Admitting: Orthotics

## 2017-12-10 DIAGNOSIS — M216X9 Other acquired deformities of unspecified foot: Secondary | ICD-10-CM

## 2017-12-10 DIAGNOSIS — L84 Corns and callosities: Secondary | ICD-10-CM

## 2017-12-10 NOTE — Progress Notes (Signed)
Patient came into today to be cast for [employer] Orthotics. Upon recommendation of Dr. Paulla Dolly Patient presents with capsulitis 2/3 L Goals are LW accomodative to offload 2/3 met heads L Plan vendor [facility]  Patient advised insurance not covered.

## 2017-12-18 ENCOUNTER — Encounter: Payer: Self-pay | Admitting: Family Medicine

## 2017-12-18 ENCOUNTER — Ambulatory Visit (INDEPENDENT_AMBULATORY_CARE_PROVIDER_SITE_OTHER): Payer: Medicare Other | Admitting: Family Medicine

## 2017-12-18 VITALS — BP 129/85 | HR 75 | Temp 97.2°F | Ht 60.0 in | Wt 206.0 lb

## 2017-12-18 DIAGNOSIS — E039 Hypothyroidism, unspecified: Secondary | ICD-10-CM

## 2017-12-18 DIAGNOSIS — R5383 Other fatigue: Secondary | ICD-10-CM

## 2017-12-18 DIAGNOSIS — K219 Gastro-esophageal reflux disease without esophagitis: Secondary | ICD-10-CM | POA: Diagnosis not present

## 2017-12-18 DIAGNOSIS — E8881 Metabolic syndrome: Secondary | ICD-10-CM | POA: Diagnosis not present

## 2017-12-18 DIAGNOSIS — M159 Polyosteoarthritis, unspecified: Secondary | ICD-10-CM

## 2017-12-18 DIAGNOSIS — M150 Primary generalized (osteo)arthritis: Secondary | ICD-10-CM

## 2017-12-18 DIAGNOSIS — R531 Weakness: Secondary | ICD-10-CM

## 2017-12-18 DIAGNOSIS — I10 Essential (primary) hypertension: Secondary | ICD-10-CM

## 2017-12-18 DIAGNOSIS — E559 Vitamin D deficiency, unspecified: Secondary | ICD-10-CM

## 2017-12-18 MED ORDER — DULOXETINE HCL 30 MG PO CPEP: 30.0000 mg | ORAL_CAPSULE | Freq: Every day | ORAL | 1 refills | Status: DC

## 2017-12-18 MED ORDER — OXYBUTYNIN CHLORIDE 5 MG PO TABS: 10.0000 mg | ORAL_TABLET | Freq: Two times a day (BID) | ORAL | 3 refills | Status: DC

## 2017-12-18 NOTE — Progress Notes (Signed)
Subjective:    Patient ID: Stacie Russell, female    DOB: [birth year], 67 y.o.   MRN: [id number]  HPI Pt here for follow up and management of chronic medical problems which includes hypothyroid, hypertension and gerd. She is taking medication regularly.  The patient still complains of fatigue and has had sleep studies.  She will be given an FOBT to return and will get lab work today.  She has visited with multiple specialist to try to come up with an answer of her ongoing fatigue and has had multiple lab work checked.  The patient denies any chest pain or shortness of breath.  She still have some problems with reflux and the gastroenterologist had asked her to call him back about this and that he indicated he may have to increase her medicines and she plans to call him back.  She occasionally has some blood in the stool but no more than usual and she is up-to-date on her colonoscopies.  She is currently taking Protonix once a day for this.  She will call him back to get further suggestions about this.  She also has had some problems with the people monitoring her sleep apnea evaluation and has had 3 different studies and cannot seem to get any response back from the people that did the studies and she is somewhat frustrated and plans to get another opinion in [place].  She thinks that the sleep apnea if it is occurring could certainly be playing a role with her fatigue and agree with that.  She is passing her water without problems.    Patient Active Problem List   Diagnosis Date Noted  . OA (osteoarthritis) of knee 10/07/2017  . Obstructive sleep apnea 08/27/2017  . Oxygen deficiency   . Hypertension   . Spinal stenosis of lumbar region 03/08/2017  . Fatigue 02/15/2017  . Palpitations 01/16/2017  . Fundic gland polyps of stomach, benign 07/10/2015  . Family history of colon cancer in mother - dx 29's 07/04/2015  . Overactive bladder 12/29/2014  . Chest pain 10/22/2013  . Metabolic  syndrome 53/64/6803  . Morbid obesity due to excess calories ([place]) 05/07/2013  . Essential hypertension, benign 11/03/2012  . Vitamin D deficiency 11/03/2012  . Knee pain   . Endometrial hyperplasia   . Hemorrhoid   . Foot pain   . Esophageal stricture   . Diverticulosis   . Hip pain   . Gastritis   . Tendonitis of ankle   . Hiatal hernia   . Hyperlipidemia   . Asthma   . GERD 10/03/2009   Outpatient Encounter Medications as of 12/18/2017  Medication Sig  . acetaminophen (TYLENOL) 500 MG tablet Take 1,000 mg by mouth 2 (two) times daily as needed for moderate pain or headache.  . albuterol (PROVENTIL HFA;VENTOLIN HFA) 108 (90 Base) MCG/ACT inhaler Inhale 2 puffs into the lungs every 6 (six) hours as needed for wheezing or shortness of breath.  Marland Kitchen aspirin EC 81 MG tablet Take 81 mg by mouth daily.  . budesonide-formoterol (SYMBICORT) 80-4.5 MCG/ACT inhaler Inhale 2 puffs into the lungs 2 (two) times daily.  . Cholecalciferol (VITAMIN D3) 2000 units TABS Take 1 tablet by mouth daily.  . furosemide (LASIX) 20 MG tablet Take 1 tablet (20 mg total) by mouth daily as needed. (Patient taking differently: Take 20 mg by mouth daily as needed for fluid or edema. )  . levothyroxine (SYNTHROID, LEVOTHROID) 25 MCG tablet TAKE 1 TABLET BY MOUTH  DAILY BEFORE  BREAKFAST  . losartan-hydrochlorothiazide (HYZAAR) 100-12.5 MG tablet TAKE 1 TABLET BY MOUTH  DAILY  . Omega-3 Fatty Acids (FISH OIL) 1000 MG CAPS Take 2 capsules by mouth daily.  Marland Kitchen oxybutynin (DITROPAN) 5 MG tablet Take 10 mg by mouth 2 (two) times daily.  . pantoprazole (PROTONIX) 40 MG tablet TAKE ONE TABLET BY MOUTH  ONCE DAILY BEFORE BREAKFAST   Facility-Administered Encounter Medications as of 12/18/2017  Medication  . 0.9 %  sodium chloride infusion     Review of Systems  Constitutional: Positive for fatigue.  HENT: Negative.   Eyes: Negative.   Respiratory: Negative.   Cardiovascular: Negative.   Gastrointestinal: Negative.     Endocrine: Negative.   Genitourinary: Negative.   Musculoskeletal: Negative.   Skin: Negative.   Allergic/Immunologic: Negative.   Neurological: Negative.   Hematological: Negative.   Psychiatric/Behavioral: Negative.        Objective:   Physical Exam  Constitutional: She is oriented to person, place, and time. She appears well-developed and well-nourished.  Patient seems to be in better spirits today.  Her husband is in treatment for bladder cancer and things are stable they are and she has had multiple evaluations for current issues that are going on and she seems to have everything in order and plans to call the gastroenterologist back because of the ongoing issues with her reflux and will see a sleep study specialist in [place].  HENT:  Head: Normocephalic and atraumatic.  Right Ear: External ear normal.  Left Ear: External ear normal.  Mouth/Throat: Oropharynx is clear and moist. No oropharyngeal exudate.  Nasal turbinate congestion  Eyes: Pupils are equal, round, and reactive to light. Conjunctivae and EOM are normal. Right eye exhibits no discharge. Left eye exhibits no discharge.  Neck: Normal range of motion. Neck supple. No thyromegaly present.  No bruits thyromegaly or anterior cervical adenopathy  Cardiovascular: Normal rate, normal heart sounds and intact distal pulses.  No murmur heard. The heart was slightly irregular today at 72/min  Pulmonary/Chest: Effort normal and breath sounds normal. She has no wheezes. She has no rales.  Clear anteriorly and posteriorly with a dry cough  Abdominal: Soft. Bowel sounds are normal. She exhibits no mass. There is tenderness. There is no guarding.  Abdominal obesity without masses  or organ enlargement or bruits.  There is some epigastric tenderness.  Musculoskeletal: Normal range of motion. She exhibits no edema.  Movement is somewhat hesitant but able to lay down and sit up without too much discomfort in her back.   Lymphadenopathy:    She has no cervical adenopathy.  Neurological: She is alert and oriented to person, place, and time. She has normal reflexes. No cranial nerve deficit.  Skin: Skin is warm and dry. No rash noted.  Psychiatric: She has a normal mood and affect. Her behavior is normal. Judgment and thought content normal.  Mood affect thought and judgment are normal  Nursing note and vitals reviewed.  BP 129/85 (BP Location: Left Arm)   Pulse 75   Temp (!) 97.2 F (36.2 C) (Oral)   Ht 5' (1.524 m)   Wt 206 lb (93.4 kg)   BMI 40.23 kg/m         Assessment & Plan:  1. Hypothyroidism, unspecified type -Continue with current treatment pending results of lab work - CBC with Differential/Platelet - Thyroid Panel With TSH  2. Vitamin D deficiency -Continue with vitamin D replacement pending results of lab work - CBC with Differential/Platelet - VITAMIN D  25 Hydroxy (Vit-D Deficiency, Fractures)  3. Essential hypertension, benign -The blood pressure is good today and she will continue with current treatment and all efforts to lose weight through diet and exercise - CBC with Differential/Platelet - BMP8+EGFR - Lipid panel - Hepatic function panel  4. Gastroesophageal reflux disease, esophagitis presence not specified -Call gastroenterologist regarding ongoing reflux symptoms as he requested - CBC with Differential/Platelet - Hepatic function panel  5. Metabolic syndrome -Continue to lose weight through diet and exercise - CBC with Differential/Platelet - BMP8+EGFR - Lipid panel  6. Other fatigue -Arrange for sleep studies - CBC with Differential/Platelet - BMP8+EGFR - Thyroid Panel With TSH  7. Morbid obesity due to excess calories ([facility]) - CBC with Differential/Platelet  8. Primary osteoarthritis involving multiple joints -Walk and exercise regularly and try to avoid falling  9. General weakness -Lab work is planned today.  Patient Instructions                        Medicare Annual Wellness Visit  [facility] and the medical providers at [facility] strive to bring you the best medical care.  In doing so we not only want to address your current medical conditions and concerns but also to detect new conditions early and prevent illness, disease and health-related problems.    Medicare offers a yearly Wellness Visit which allows our clinical staff to assess your need for preventative services including immunizations, lifestyle education, counseling to decrease risk of preventable diseases and screening for fall risk and other medical concerns.    This visit is provided free of charge (no copay) for all Medicare recipients. The clinical pharmacists at [facility] have begun to conduct these Wellness Visits which will also include a thorough review of all your medications.    As you primary medical provider recommend that you make an appointment for your Annual Wellness Visit if you have not done so already this year.  You may set up this appointment before you leave today or you may call back ([phone]) and schedule an appointment.  Please make sure when you call that you mention that you are scheduling your Annual Wellness Visit with the clinical pharmacist so that the appointment may be made for the proper length of time.     Continue current medications. Continue good therapeutic lifestyle changes which include good diet and exercise. Fall precautions discussed with patient. If an FOBT was given today- please return it to our front desk. If you are over 23 years old - you may need Prevnar 23 or the adult Pneumonia vaccine.  **Flu shots are available--- please call and schedule a FLU-CLINIC appointment**  After your visit with [place] today you will receive a survey in the mail or online from [employer] regarding your care with [place]. Please take a moment to fill this out. Your feedback is very important to [place] as  you can help [place] better understand your patient needs as well as improve your experience and satisfaction. WE CARE ABOUT YOU!!!   We will do a referral to the physicians that you would like to be referred to in [facility] who evaluate you for sleep apnea.  It is important that you follow through with this.   Continue with as aggressive therapeutic lifestyle changes as possible which include weight loss through diet and exercise Be sure and call the gastroenterologist and let him know that you are still having issues with reflux.  Arrie Senate MD

## 2017-12-18 NOTE — Patient Instructions (Addendum)
Medicare Annual Wellness Visit  [facility] and the medical providers at [facility] strive to bring you the best medical care.  In doing so we not only want to address your current medical conditions and concerns but also to detect new conditions early and prevent illness, disease and health-related problems.    Medicare offers a yearly Wellness Visit which allows our clinical staff to assess your need for preventative services including immunizations, lifestyle education, counseling to decrease risk of preventable diseases and screening for fall risk and other medical concerns.    This visit is provided free of charge (no copay) for all Medicare recipients. The clinical pharmacists at [facility] have begun to conduct these Wellness Visits which will also include a thorough review of all your medications.    As you primary medical provider recommend that you make an appointment for your Annual Wellness Visit if you have not done so already this year.  You may set up this appointment before you leave today or you may call back ([phone]) and schedule an appointment.  Please make sure when you call that you mention that you are scheduling your Annual Wellness Visit with the clinical pharmacist so that the appointment may be made for the proper length of time.     Continue current medications. Continue good therapeutic lifestyle changes which include good diet and exercise. Fall precautions discussed with patient. If an FOBT was given today- please return it to our front desk. If you are over 65 years old - you may need Prevnar 66 or the adult Pneumonia vaccine.  **Flu shots are available--- please call and schedule a FLU-CLINIC appointment**  After your visit with [place] today you will receive a survey in the mail or online from [employer] regarding your care with [place]. Please take a moment to fill this out. Your feedback is very  important to [place] as you can help [place] better understand your patient needs as well as improve your experience and satisfaction. WE CARE ABOUT YOU!!!   We will do a referral to the physicians that you would like to be referred to in [facility] who evaluate you for sleep apnea.  It is important that you follow through with this.   Continue with as aggressive therapeutic lifestyle changes as possible which include weight loss through diet and exercise Be sure and call the gastroenterologist and let him know that you are still having issues with reflux.

## 2017-12-19 LAB — CBC WITH DIFFERENTIAL/PLATELET
Basophils Absolute: 0 10*3/uL (ref 0.0–0.2)
Basos: 0 %
EOS (ABSOLUTE): 0.3 10*3/uL (ref 0.0–0.4)
EOS: 5 %
HEMATOCRIT: 40.5 % (ref 34.0–46.6)
HEMOGLOBIN: 13.6 g/dL (ref 11.1–15.9)
Immature Grans (Abs): 0 10*3/uL (ref 0.0–0.1)
Immature Granulocytes: 0 %
LYMPHS ABS: 1.7 10*3/uL (ref 0.7–3.1)
Lymphs: 27 %
MCH: 31.5 pg (ref 26.6–33.0)
MCHC: 33.6 g/dL (ref 31.5–35.7)
MCV: 94 fL (ref 79–97)
MONOCYTES: 8 %
MONOS ABS: 0.5 10*3/uL (ref 0.1–0.9)
NEUTROS ABS: 3.6 10*3/uL (ref 1.4–7.0)
Neutrophils: 60 %
Platelets: 255 10*3/uL (ref 150–379)
RBC: 4.32 x10E6/uL (ref 3.77–5.28)
RDW: 13 % (ref 12.3–15.4)
WBC: 6.1 10*3/uL (ref 3.4–10.8)

## 2017-12-19 LAB — LIPID PANEL
CHOL/HDL RATIO: 3.3 ratio (ref 0.0–4.4)
Cholesterol, Total: 186 mg/dL (ref 100–199)
HDL: 57 mg/dL (ref >39)
LDL CALC: 116 mg/dL — AB (ref 0–99)
TRIGLYCERIDES: 64 mg/dL (ref 0–149)
VLDL Cholesterol Cal: 13 mg/dL (ref 5–40)

## 2017-12-19 LAB — BMP8+EGFR
BUN / CREAT RATIO: 21 (ref 12–28)
BUN: 15 mg/dL (ref 8–27)
CO2: 25 mmol/L (ref 20–29)
CREATININE: 0.73 mg/dL (ref 0.57–1.00)
Calcium: 9.4 mg/dL (ref 8.7–10.3)
Chloride: 102 mmol/L (ref 96–106)
GFR calc Af Amer: 98 mL/min/{1.73_m2} (ref >59)
GFR, EST NON AFRICAN AMERICAN: 85 mL/min/{1.73_m2} (ref >59)
Glucose: 86 mg/dL (ref 65–99)
Potassium: 3.9 mmol/L (ref 3.5–5.2)
SODIUM: 142 mmol/L (ref 134–144)

## 2017-12-19 LAB — THYROID PANEL WITH TSH
Free Thyroxine Index: 1.5 (ref 1.2–4.9)
T3 Uptake Ratio: 24 % (ref 24–39)
T4 TOTAL: 6.4 ug/dL (ref 4.5–12.0)
TSH: 3.49 u[IU]/mL (ref 0.450–4.500)

## 2017-12-19 LAB — HEPATIC FUNCTION PANEL
ALBUMIN: 4.3 g/dL (ref 3.6–4.8)
ALK PHOS: 76 IU/L (ref 39–117)
ALT: 22 IU/L (ref 0–32)
AST: 18 IU/L (ref 0–40)
BILIRUBIN TOTAL: 0.5 mg/dL (ref 0.0–1.2)
BILIRUBIN, DIRECT: 0.14 mg/dL (ref 0.00–0.40)
Total Protein: 6.4 g/dL (ref 6.0–8.5)

## 2017-12-19 LAB — VITAMIN D 25 HYDROXY (VIT D DEFICIENCY, FRACTURES): Vit D, 25-Hydroxy: 43.3 ng/mL (ref 30.0–100.0)

## 2017-12-24 ENCOUNTER — Other Ambulatory Visit: Payer: Self-pay | Admitting: Family Medicine

## 2017-12-31 ENCOUNTER — Other Ambulatory Visit: Payer: Medicare Other | Admitting: Orthotics

## 2018-01-07 ENCOUNTER — Ambulatory Visit: Payer: Medicare Other | Admitting: Orthotics

## 2018-01-07 DIAGNOSIS — M216X9 Other acquired deformities of unspecified foot: Secondary | ICD-10-CM

## 2018-01-07 DIAGNOSIS — M79672 Pain in left foot: Secondary | ICD-10-CM

## 2018-01-07 DIAGNOSIS — L84 Corns and callosities: Secondary | ICD-10-CM

## 2018-01-07 NOTE — Progress Notes (Signed)
Patient came in today to pick up custom made foot orthotics.  The goals were accomplished and the patient reported no dissatisfaction with said orthotics.  Patient was advised of breakin period and how to report any issues. 

## 2018-01-23 ENCOUNTER — Other Ambulatory Visit: Payer: Self-pay | Admitting: *Deleted

## 2018-01-23 MED ORDER — DULOXETINE HCL 30 MG PO CPEP: 30.0000 mg | ORAL_CAPSULE | Freq: Every day | ORAL | 3 refills | Status: DC

## 2018-02-11 ENCOUNTER — Other Ambulatory Visit: Payer: Self-pay | Admitting: Family Medicine

## 2018-04-21 ENCOUNTER — Ambulatory Visit (INDEPENDENT_AMBULATORY_CARE_PROVIDER_SITE_OTHER): Payer: Medicare Other | Admitting: *Deleted

## 2018-04-21 VITALS — BP 123/65 | HR 77 | Ht 60.0 in | Wt 205.0 lb

## 2018-04-21 DIAGNOSIS — Z0000 Encounter for general adult medical examination without abnormal findings: Secondary | ICD-10-CM | POA: Diagnosis not present

## 2018-04-21 NOTE — Patient Instructions (Signed)
Please work on your goal of reducing your ice cream intake to 3 nights per week.   Please consider getting your Shingrix vaccine when you return from your trip.   Please review the information given on naming a health care power of attorney, and if you complete this please bring a copy to our office to be filed in your medical record.  Please also bring a copy of your Living Will to be filed in your medical record.  Thank you for coming in for your Annual Wellness Visit today!!   Preventive Care 65 Years and Older, Female Preventive care refers to lifestyle choices and visits with your health care provider that can promote health and wellness. What does preventive care include?  A yearly physical exam. This is also called an annual well check.  Dental exams once or twice a year.  Routine eye exams. Ask your health care provider how often you should have your eyes checked.  Personal lifestyle choices, including: ? Daily care of your teeth and gums. ? Regular physical activity. ? Eating a healthy diet. ? Avoiding tobacco and drug use. ? Limiting alcohol use. ? Practicing safe sex. ? Taking low-dose aspirin every day. ? Taking vitamin and mineral supplements as recommended by your health care provider. What happens during an annual well check? The services and screenings done by your health care provider during your annual well check will depend on your age, overall health, lifestyle risk factors, and family history of disease. Counseling Your health care provider may ask you questions about your:  Alcohol use.  Tobacco use.  Drug use.  Emotional well-being.  Home and relationship well-being.  Sexual activity.  Eating habits.  History of falls.  Memory and ability to understand (cognition).  Work and work Statistician.  Reproductive health.  Screening You may have the following tests or measurements:  Height, weight, and BMI.  Blood pressure.  Lipid and  cholesterol levels. These may be checked every 5 years, or more frequently if you are over 9 years old.  Skin check.  Lung cancer screening. You may have this screening every year starting at age 75 if you have a 30-pack-year history of smoking and currently smoke or have quit within the past 15 years.  Fecal occult blood test (FOBT) of the stool. You may have this test every year starting at age 76.  Flexible sigmoidoscopy or colonoscopy. You may have a sigmoidoscopy every 5 years or a colonoscopy every 10 years starting at age 8.  Hepatitis C blood test.  Hepatitis B blood test.  Sexually transmitted disease (STD) testing.  Diabetes screening. This is done by checking your blood sugar (glucose) after you have not eaten for a while (fasting). You may have this done every 1-3 years.  Bone density scan. This is done to screen for osteoporosis. You may have this done starting at age 36.  Mammogram. This may be done every 1-2 years. Talk to your health care provider about how often you should have regular mammograms.  Talk with your health care provider about your test results, treatment options, and if necessary, the need for more tests. Vaccines Your health care provider may recommend certain vaccines, such as:  Influenza vaccine. This is recommended every year.  Tetanus, diphtheria, and acellular pertussis (Tdap, Td) vaccine. You may need a Td booster every 10 years.  Varicella vaccine. You may need this if you have not been vaccinated.  Zoster vaccine. You may need this after age 71.  Measles, mumps, and rubella (MMR) vaccine. You may need at least one dose of MMR if you were born in 1957 or later. You may also need a second dose.  Pneumococcal 13-valent conjugate (PCV13) vaccine. One dose is recommended after age 65.  Pneumococcal polysaccharide (PPSV23) vaccine. One dose is recommended after age 65.  Meningococcal vaccine. You may need this if you have certain  conditions.  Hepatitis A vaccine. You may need this if you have certain conditions or if you travel or work in places where you may be exposed to hepatitis A.  Hepatitis B vaccine. You may need this if you have certain conditions or if you travel or work in places where you may be exposed to hepatitis B.  Haemophilus influenzae type b (Hib) vaccine. You may need this if you have certain conditions.  Talk to your health care provider about which screenings and vaccines you need and how often you need them. This information is not intended to replace advice given to you by your health care provider. Make sure you discuss any questions you have with your health care provider. Document Released: 08/26/2015 Document Revised: 04/18/2016 Document Reviewed: 05/31/2015 Elsevier Interactive Patient Education  2018 [employer]  

## 2018-04-22 ENCOUNTER — Encounter: Payer: Self-pay | Admitting: *Deleted

## 2018-04-22 NOTE — Progress Notes (Signed)
Subjective:   Stacie Russell is a 67 y.o. female who presents for an Initial Medicare Annual Wellness Visit.  Stacie Russell is accompanied today by her husband Stacie Russell.  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  She enjoys camping in an RV with her husband, traveling, cooking, and playing card games.  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  She and her husband live alone.  They have 2 children and 3 grandchildren who live locally.  She feels her health this year is better than it was last year because she is feeling better in general.  She had a right total knee replacement in February of 2019.  She reports no ER visits or other hospitalizations this year.   Review of Systems    All negative today  Cardiac Risk Factors include: advanced age (>21men, >87 women);dyslipidemia;hypertension     Objective:    Today's Vitals   04/21/18 1339  BP: 123/65  Pulse: 77  Weight: 205 lb (93 kg)  Height: 5' (1.524 m)  PainSc: 0-No pain   Body mass index is 40.04 kg/m.  Advanced Directives 04/22/2018 04/22/2018 10/07/2017 10/07/2017 10/01/2017 08/15/2017 06/17/2017  Does Patient Have a Medical Advance Directive? Yes - Yes Yes Yes Yes Yes  Type of Advance Directive - Living will [place];Living will [place];Living will [place];Living will Living will Living will  Does patient want to make changes to medical advance directive? Yes (MAU/Ambulatory/Procedural Areas - Information given) No - Patient declined No - Patient declined No - Patient declined No - Patient declined No - Patient declined No - Patient declined  Copy of [place] in Chart? - - No - copy requested No - copy requested No - copy requested - -    Patient states she has a living will, but does not have health care power of attorney.  Paperwork regarding naming a health care power of attorney  given today.   Current Medications (verified) Outpatient Encounter Medications as of 04/21/2018  Medication Sig  . acetaminophen (TYLENOL) 500 MG tablet Take 1,000 mg by mouth 2 (two) times daily as needed for moderate pain or headache.  . albuterol (PROVENTIL HFA;VENTOLIN HFA) 108 (90 Base) MCG/ACT inhaler Inhale 2 puffs into the lungs every 6 (six) hours as needed for wheezing or shortness of breath.  Marland Kitchen aspirin EC 81 MG tablet Take 81 mg by mouth daily.  . budesonide-formoterol (SYMBICORT) 80-4.5 MCG/ACT inhaler Inhale 2 puffs into the lungs 2 (two) times daily. (Patient taking differently: Inhale 2 puffs into the lungs 2 (two) times daily as needed. )  . Cholecalciferol (VITAMIN D3) 2000 units TABS Take 1 tablet by mouth daily.  . DULoxetine (CYMBALTA) 30 MG capsule Take 1 capsule (30 mg total) by mouth daily.  Marland Kitchen levothyroxine (SYNTHROID, LEVOTHROID) 25 MCG tablet TAKE 1 TABLET BY MOUTH  DAILY BEFORE BREAKFAST  . losartan-hydrochlorothiazide (HYZAAR) 100-12.5 MG tablet TAKE 1 TABLET BY MOUTH  DAILY  . Omega-3 Fatty Acids (FISH OIL) 1000 MG CAPS Take 2 capsules by mouth daily.  Marland Kitchen oxybutynin (DITROPAN) 5 MG tablet Take 2 tablets (10 mg total) by mouth 2 (two) times daily.  . pantoprazole (PROTONIX) 40 MG tablet TAKE 1 TABLET BY MOUTH ONCE DAILY BEFORE BREAKFAST  . furosemide (LASIX) 20 MG tablet TAKE 1 TABLET BY MOUTH  DAILY AS NEEDED (Patient not taking: Reported on 04/21/2018)   Facility-Administered  Encounter Medications as of 04/21/2018  Medication  . 0.9 %  sodium chloride infusion    Allergies (verified) Patient has no known allergies.   History: Past Medical History:  Diagnosis Date  . Arthritis   . Asthma   . Diverticulosis   . Endometrial hyperplasia   . Esophageal stricture   . Foot pain   . Fundic gland polyps of stomach, benign 07/10/2015  . Gastritis   . Hemorrhoid   . Hiatal hernia   . Hip pain   . Hyperlipidemia   . Hypertension   . Knee pain   . Metabolic  syndrome   . Obstructive sleep apnea 08/27/2017  . Oxygen deficiency    pt denies  . Tendonitis of ankle   . Vitamin D deficiency    Past Surgical History:  Procedure Laterality Date  . ANAL FISSURE REPAIR    . CATARACT EXTRACTION  May 2009  . COLONOSCOPY  12/26/07  . ESOPHAGOGASTRODUODENOSCOPY  12/26/07, 05/13/08  . EYE SURGERY     bilateral cataract with lens implants  . KNEE CARTILAGE SURGERY Right Sept 1999  . 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 UMBILICAL HERNIA REPAIR  05/20/06   Family History  Problem Relation Age of Onset  . Cancer Mother        Liver, stomach, colon  . Colon cancer Mother   . Heart attack Father   . Heart disease Sister   . Breast cancer Sister   . Breast cancer Sister   . Esophageal cancer Neg Hx   . Stomach cancer Neg Hx   . Rectal cancer Neg Hx    Social History   Socioeconomic History  . Marital status: Married    Spouse name: Stacie Russell  . Number of children: Not on file  . Years of education: Not on file  . Highest education level: Not on file  Occupational History  . Occupation: Retired  Scientific laboratory technician  . Financial resource strain: Not on file  . Food insecurity:    Worry: Not on file    Inability: Not on file  . Transportation needs:    Medical: Not on file    Non-medical: Not on file  Tobacco Use  . Smoking status: Never Smoker  . Smokeless tobacco: Never Used  Substance and Sexual Activity  . Alcohol use: No  . Drug use: No  . Sexual activity: Not on file  Lifestyle  . Physical activity:    Days per week: Not on file    Minutes per session: Not on file  . Stress: Not on file  Relationships  . Social connections:    Talks on phone: Not on file    Gets together: Not on file    Attends religious service: Not on file    Active member of club or organization: Not on file      Attends meetings of clubs or organizations: Not on file    Relationship status: Not on file  Other Topics Concern  . Not on file  Social History Narrative   Married and retired   No EtOH, tobacco or drug use       Clinical Intake:     Pain Score: 0-No pain                  Activities of Daily Living In your present state  of health, do you have any difficulty performing the following activities: 04/21/2018 10/07/2017  Hearing? N N  Vision? N N  Difficulty concentrating or making decisions? N N  Walking or climbing stairs? N Y  Dressing or bathing? N N  Doing errands, shopping? N N  Preparing Food and eating ? N -  Using the Toilet? N -  In the past six months, have you accidently leaked urine? Y -  Comment Trouble with urine leaking- takes oxybutin -  Do you have problems with loss of bowel control? N -  Managing your Medications? N -  Managing your Finances? N -  Housekeeping or managing your Housekeeping? N -  Some recent data might be hidden     Immunizations and Health Maintenance Immunization History  Administered Date(s) Administered  . Influenza Whole 07/25/2012  . Influenza, High Dose Seasonal PF 06/11/2017  . Influenza,inj,Quad PF,6+ Mos 05/06/2013, 06/25/2014, 06/20/2015  . Pneumococcal Conjugate-13 05/06/2013  . Pneumococcal Polysaccharide-23 09/05/2016  . Tdap 04/26/2011  . Zoster 04/26/2011   Health Maintenance Due  Topic Date Due  . INFLUENZA VACCINE  03/13/2018    Patient Care Team: Chipper Herb, MD as PCP - General (Family Medicine) Lorretta Harp, MD as PCP - Cardiology (Cardiology) Irine Seal, MD (Urology) Paula Compton, MD (Obstetrics and Gynecology) Gatha Mayer, MD (Gastroenterology) Gaynelle Arabian, MD as Consulting Physician (Orthopedic Surgery)     Assessment:   This is a routine wellness examination for Andrena.  Hearing/Vision screen No hearing deficit noted, patient has no complaints regarding her  hearing. States she has had bilateral cataracts removed, and sees well with over the counter reading glasses as needed.   Dietary issues and exercise activities discussed:  Patient states she eats a balanced diet of mostly lean proteins, vegetables, fruits and whole grains.  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  She does state that she eats ice cream several nights per week, and would like to work on reducing that intake.     Current Exercise Habits: Structured exercise class, Type of exercise: strength training/weights;calisthenics, Time (Minutes): 60, Frequency (Times/Week): 3, Weekly Exercise (Minutes/Week): 180, Intensity: Mild States she exercises at the [employer] recreation department 3 days per week doing weight training and aerobics.  Goals    . DIET - REDUCE SUGAR INTAKE     Reduce ice cream intake to 3 nights per week.       Depression Screen PHQ 2/9 Scores 04/22/2018 12/18/2017 07/15/2017 03/11/2017 02/08/2017 01/14/2017 01/03/2017  PHQ - 2 Score 0 0 0 0 0 0 1    Fall Risk Fall Risk  04/22/2018 12/18/2017 07/15/2017 03/11/2017 02/08/2017  Falls in the past year? No No No No No  Number falls in past yr: - - - - -  Injury with Fall? - - - - -    Is the patient's home free of loose throw rugs in walkways, pet beds, electrical cords, etc?   yes      Grab bars in the bathroom? yes      Handrails on the stairs?   no stairs in home      Adequate lighting?   yes  Cognitive Function: MMSE - Mini Mental State Exam 04/22/2018  Orientation to time 5  Orientation to Place 5  Registration 3  Attention/ Calculation 5  Recall 3  Language- name 2 objects 2  Language- repeat 1  Language- follow 3 step command  3  Language- read & follow direction 1  Write a sentence 1  Copy design 1  Total score 30        Screening Tests Health Maintenance  Topic Date Due  . INFLUENZA VACCINE  03/13/2018  . Hepatitis C Screening   07/15/2018 (Originally 1951/05/01)  . MAMMOGRAM  04/18/2019  . COLONOSCOPY  07/03/2020  . TETANUS/TDAP  04/25/2021  . DEXA SCAN  Completed  . PNA vac Low Risk Adult  Completed    Qualifies for Shingles Vaccine? Yes, declined today.   Cancer Screenings: Lung: Low Dose CT Chest recommended if Age 56-80 years, 30 pack-year currently smoking OR have quit w/in 15years. Patient does not qualify. Breast: Up to date on Mammogram? Yes   Up to date of Bone Density/Dexa? Yes Colorectal: up to date  Additional Screenings:  Hepatitis C Screening:  Recommend at next visit with Dr. Laurance Flatten     Plan:     Work on goal of reducing your ice cream intake to 3 nights per week.  Consider getting Shingrix vaccine when you return from your trip.  Review the information given on naming a health care power of attorney, and if you complete this please bring a copy to our office to be filed in your medical record.   Bring a copy of your Living Will to be filed in your medical record.     I have personally reviewed and noted the following in the patient's chart:   . Medical and social history . Use of alcohol, tobacco or illicit drugs  . Current medications and supplements . Functional ability and status . Nutritional status . Physical activity . Advanced directives . List of other physicians . Hospitalizations, surgeries, and ER visits in previous 12 months . Vitals . Screenings to include cognitive, depression, and falls . Referrals and appointments  In addition, I have reviewed and discussed with patient certain preventive protocols, quality metrics, and best practice recommendations. A written personalized care plan for preventive services as well as general preventive health recommendations were provided to patient.     Wynter Grave M, RN   04/22/2018  I have reviewed and agree with the above AWV documentation.  Arrie Senate MD

## 2018-04-29 DIAGNOSIS — Z803 Family history of malignant neoplasm of breast: Secondary | ICD-10-CM | POA: Diagnosis not present

## 2018-04-29 DIAGNOSIS — Z1231 Encounter for screening mammogram for malignant neoplasm of breast: Secondary | ICD-10-CM | POA: Diagnosis not present

## 2018-04-29 LAB — HM MAMMOGRAPHY

## 2018-06-03 ENCOUNTER — Other Ambulatory Visit: Payer: Self-pay

## 2018-06-03 NOTE — Patient Outreach (Signed)
[facility]) Care Management  06/03/2018  [name] [birth year] [id number]   Medication Adherence call to Mrs. Stacie Russell left a message for patient to call back patient is due on Losartan/Hctz 100/12.5 mg. Stacie Russell is showing past due on Losartan / HCTZ 100/12.5 mg under [place].   [facility] Management Direct Dial [phone]  Fax [phone] [email or web address]

## 2018-06-25 ENCOUNTER — Ambulatory Visit (INDEPENDENT_AMBULATORY_CARE_PROVIDER_SITE_OTHER): Payer: Medicare Other | Admitting: Family Medicine

## 2018-06-25 ENCOUNTER — Ambulatory Visit (INDEPENDENT_AMBULATORY_CARE_PROVIDER_SITE_OTHER): Payer: Medicare Other

## 2018-06-25 ENCOUNTER — Encounter: Payer: Self-pay | Admitting: Family Medicine

## 2018-06-25 VITALS — BP 166/102 | HR 68 | Temp 97.4°F | Ht 60.0 in | Wt 214.0 lb

## 2018-06-25 DIAGNOSIS — K219 Gastro-esophageal reflux disease without esophagitis: Secondary | ICD-10-CM | POA: Diagnosis not present

## 2018-06-25 DIAGNOSIS — E039 Hypothyroidism, unspecified: Secondary | ICD-10-CM | POA: Diagnosis not present

## 2018-06-25 DIAGNOSIS — Z23 Encounter for immunization: Secondary | ICD-10-CM

## 2018-06-25 DIAGNOSIS — I10 Essential (primary) hypertension: Secondary | ICD-10-CM

## 2018-06-25 DIAGNOSIS — M19031 Primary osteoarthritis, right wrist: Secondary | ICD-10-CM | POA: Diagnosis not present

## 2018-06-25 DIAGNOSIS — E559 Vitamin D deficiency, unspecified: Secondary | ICD-10-CM | POA: Diagnosis not present

## 2018-06-25 DIAGNOSIS — M25531 Pain in right wrist: Secondary | ICD-10-CM

## 2018-06-25 DIAGNOSIS — E8881 Metabolic syndrome: Secondary | ICD-10-CM

## 2018-06-25 MED ORDER — LOSARTAN POTASSIUM-HCTZ 100-12.5 MG PO TABS: 1.0000 | ORAL_TABLET | Freq: Every day | ORAL | 1 refills | Status: DC

## 2018-06-25 NOTE — Patient Instructions (Addendum)
Medicare Annual Wellness Visit  [facility] and the medical providers at [facility] strive to bring you the best medical care.  In doing so we not only want to address your current medical conditions and concerns but also to detect new conditions early and prevent illness, disease and health-related problems.    Medicare offers a yearly Wellness Visit which allows our clinical staff to assess your need for preventative services including immunizations, lifestyle education, counseling to decrease risk of preventable diseases and screening for fall risk and other medical concerns.    This visit is provided free of charge (no copay) for all Medicare recipients. The clinical pharmacists at [facility] have begun to conduct these Wellness Visits which will also include a thorough review of all your medications.    As you primary medical provider recommend that you make an appointment for your Annual Wellness Visit if you have not done so already this year.  You may set up this appointment before you leave today or you may call back ([phone]) and schedule an appointment.  Please make sure when you call that you mention that you are scheduling your Annual Wellness Visit with the clinical pharmacist so that the appointment may be made for the proper length of time.     Continue current medications. Continue good therapeutic lifestyle changes which include good diet and exercise. Fall precautions discussed with patient. If an FOBT was given today- please return it to our front desk. If you are over 68 years old - you may need Prevnar 37 or the adult Pneumonia vaccine.  **Flu shots are available--- please call and schedule a FLU-CLINIC appointment**  After your visit with [place] today you will receive a survey in the mail or online from [employer] regarding your care with [place]. Please take a moment to fill this out. Your feedback is very  important to [place] as you can help [place] better understand your patient needs as well as improve your experience and satisfaction. WE CARE ABOUT YOU!!!   Watch diet more closely.  Avoid NSAIDs caffeine and highly spice foods Continue to take pantoprazole more regularly and take 1 daily every day before breakfast and add a Pepcid AC and take this before dinner at nighttime at least for 4 weeks.  If the reflux persists we may need to talk to Dr. Carlean Purl. Drink plenty of fluids and stay well-hydrated Continue to work on losing weight through diet and exercise

## 2018-06-25 NOTE — Progress Notes (Signed)
Subjective:    Patient ID: Stacie Russell, female    DOB: [birth year], 67 y.o.   MRN: [id number]  HPI Pt here for follow up and management of chronic medical problems which includes hypothyroid and hypertension. She is taking medication regularly.  The patient is doing well overall but does complain of right wrist pain and swelling with no history of any injury.  She is due to get lab work today will be given an FOBT and will also be given a flu shot and a Prevnar vaccine today.  She is requesting refills on her blood pressure medicine.  Her weight is up 9 pounds from the previous visit.  The patient is feeling better as far as her fatigue and she thinks the Cymbalta has helped.  She is taking Protonix and using inhalers and takes Lasix 20 mg.  The patient denies any chest pain pressure tightness or shortness of breath anymore than usual.  She does have a history of some bright red blood in the stool periodically and her gastroenterologist is aware of this.  She denies any trouble with swallowing nausea vomiting diarrhea.  She does have more heartburn than usual and tries to take her Protonix regularly.  With this knowledge we have asked her to increase and take Pepcid AC before supper for 4 weeks.  She denies any trouble with passing her water.  She is up-to-date on her eye exams.     Patient Active Problem List   Diagnosis Date Noted  . OA (osteoarthritis) of knee 10/07/2017  . Obstructive sleep apnea 08/27/2017  . Oxygen deficiency   . Hypertension   . Spinal stenosis of lumbar region 03/08/2017  . Fatigue 02/15/2017  . Palpitations 01/16/2017  . Fundic gland polyps of stomach, benign 07/10/2015  . Family history of colon cancer in mother - dx 52's 07/04/2015  . Overactive bladder 12/29/2014  . Chest pain 10/22/2013  . Metabolic syndrome 00/71/2197  . Morbid obesity due to excess calories ([place]) 05/07/2013  . Essential hypertension, benign 11/03/2012  . Vitamin D deficiency  11/03/2012  . Knee pain   . Endometrial hyperplasia   . Hemorrhoid   . Foot pain   . Esophageal stricture   . Diverticulosis   . Hip pain   . Gastritis   . Tendonitis of ankle   . Hiatal hernia   . Hyperlipidemia   . Asthma   . GERD 10/03/2009   Outpatient Encounter Medications as of 06/25/2018  Medication Sig  . aspirin EC 81 MG tablet Take 81 mg by mouth daily.  . Cholecalciferol (VITAMIN D3) 2000 units TABS Take 1 tablet by mouth daily.  . DULoxetine (CYMBALTA) 30 MG capsule Take 1 capsule (30 mg total) by mouth daily.  Marland Kitchen levothyroxine (SYNTHROID, LEVOTHROID) 25 MCG tablet TAKE 1 TABLET BY MOUTH  DAILY BEFORE BREAKFAST  . losartan-hydrochlorothiazide (HYZAAR) 100-12.5 MG tablet TAKE 1 TABLET BY MOUTH  DAILY  . Omega-3 Fatty Acids (FISH OIL) 1000 MG CAPS Take 2 capsules by mouth daily.  Marland Kitchen oxybutynin (DITROPAN) 5 MG tablet Take 2 tablets (10 mg total) by mouth 2 (two) times daily.  . pantoprazole (PROTONIX) 40 MG tablet TAKE 1 TABLET BY MOUTH ONCE DAILY BEFORE BREAKFAST  . acetaminophen (TYLENOL) 500 MG tablet Take 1,000 mg by mouth 2 (two) times daily as needed for moderate pain or headache.  . albuterol (PROVENTIL HFA;VENTOLIN HFA) 108 (90 Base) MCG/ACT inhaler Inhale 2 puffs into the lungs every 6 (six) hours as needed for wheezing  or shortness of breath.  . budesonide-formoterol (SYMBICORT) 80-4.5 MCG/ACT inhaler Inhale 2 puffs into the lungs 2 (two) times daily. (Patient not taking: Reported on 06/25/2018)  . furosemide (LASIX) 20 MG tablet TAKE 1 TABLET BY MOUTH  DAILY AS NEEDED (Patient not taking: Reported on 04/21/2018)  . [DISCONTINUED] 0.9 %  sodium chloride infusion    No facility-administered encounter medications on file as of 06/25/2018.      Review of Systems  Constitutional: Negative.   HENT: Negative.   Eyes: Negative.   Respiratory: Negative.   Cardiovascular: Negative.   Gastrointestinal: Negative.   Endocrine: Negative.   Genitourinary: Negative.     Musculoskeletal: Positive for arthralgias (right wrist pain / swelling ).  Skin: Negative.   Allergic/Immunologic: Negative.   Neurological: Negative.   Hematological: Negative.   Psychiatric/Behavioral: Negative.        Objective:   Physical Exam  Constitutional: She is oriented to person, place, and time. She appears well-developed and well-nourished. No distress.  Patient is pleasant and alert but is complaining with the right wrist pain.  HENT:  Head: Normocephalic and atraumatic.  Right Ear: External ear normal.  Left Ear: External ear normal.  Nose: Nose normal.  Mouth/Throat: Oropharynx is clear and moist. No oropharyngeal exudate.  Eyes: Pupils are equal, round, and reactive to light. Conjunctivae and EOM are normal. Right eye exhibits no discharge. Left eye exhibits no discharge. No scleral icterus.  Up-to-date on eye exam  Neck: Normal range of motion. Neck supple. No thyromegaly present.  No bruits thyromegaly or anterior cervical adenopathy  Cardiovascular: Normal rate, regular rhythm, normal heart sounds and intact distal pulses.  No murmur heard. The heart is regular at 72/min with good pedal pulses and no edema  Pulmonary/Chest: Effort normal and breath sounds normal. She has no wheezes. She has no rales.  Clear anteriorly and posteriorly  Abdominal: Soft. Bowel sounds are normal. She exhibits no mass. There is no tenderness. There is no rebound.  Abdominal obesity without masses tenderness organ enlargement or bruits  Musculoskeletal: She exhibits tenderness. She exhibits no edema or deformity.  Tenderness right wrist with palpation no increased rubor or swelling apparent.  Lymphadenopathy:    She has no cervical adenopathy.  Neurological: She is alert and oriented to person, place, and time. She has normal reflexes. No cranial nerve deficit.  Skin: Skin is warm and dry. No rash noted.  Psychiatric: She has a normal mood and affect. Her behavior is normal.  Judgment and thought content normal.  The patient's mood affect and behavior are all normal for her.  Nursing note and vitals reviewed.  BP (!) 166/81 (BP Location: Left Arm)   Pulse 68   Temp (!) 97.4 F (36.3 C) (Oral)   Ht 5' (1.524 m)   Wt 214 lb (97.1 kg)   BMI 41.79 kg/m         Assessment & Plan:  1. Hypothyroidism, unspecified type -Check thyroid profile and make any changes when the results are returned - CBC with Differential/Platelet - Thyroid Panel With TSH - CBC with Differential/Platelet - Thyroid Panel With TSH  2. Vitamin D deficiency -Continue with vitamin D replacement pending results of lab work - CBC with Differential/Platelet - VITAMIN D 25 Hydroxy (Vit-D Deficiency, Fractures) - CBC with Differential/Platelet - VITAMIN D 25 Hydroxy (Vit-D Deficiency, Fractures)  3. Gastroesophageal reflux disease, esophagitis presence not specified -Add Pepcid AC and take this regularly before dinner at nighttime in addition to taking the Protonix  during the day.  Before breakfast. - CBC with Differential/Platelet - CBC with Differential/Platelet - Hepatic function panel  4. Essential hypertension, benign -The blood pressure is up today.  The patient has not been taking her medicine regularly as she is waiting for her supply to be returned in the mail.  She had no blood pressure medicine today.  She will bring readings by from home in a couple weeks and have the nurse recheck her blood pressure here. - BMP8+EGFR - CBC with Differential/Platelet - Lipid panel - Hepatic function panel - BMP8+EGFR - CBC with Differential/Platelet - Lipid panel - Hepatic function panel  5. Metabolic syndrome -Continue to work aggressively on weight loss through diet and exercise - BMP8+EGFR - CBC with Differential/Platelet - BMP8+EGFR - CBC with Differential/Platelet  6. Right wrist pain -The wrist is tender to palpation.  There is no rubor or swelling compared to the left  wrist. - Uric acid - DG Wrist Complete Right; Future  Meds ordered this encounter  Medications  . losartan-hydrochlorothiazide (HYZAAR) 100-12.5 MG tablet    Sig: Take 1 tablet by mouth daily.    Dispense:  90 tablet    Refill:  1   Patient Instructions                       Medicare Annual Wellness Visit  [facility] and the medical providers at [facility] strive to bring you the best medical care.  In doing so we not only want to address your current medical conditions and concerns but also to detect new conditions early and prevent illness, disease and health-related problems.    Medicare offers a yearly Wellness Visit which allows our clinical staff to assess your need for preventative services including immunizations, lifestyle education, counseling to decrease risk of preventable diseases and screening for fall risk and other medical concerns.    This visit is provided free of charge (no copay) for all Medicare recipients. The clinical pharmacists at [facility] have begun to conduct these Wellness Visits which will also include a thorough review of all your medications.    As you primary medical provider recommend that you make an appointment for your Annual Wellness Visit if you have not done so already this year.  You may set up this appointment before you leave today or you may call back ([phone]) and schedule an appointment.  Please make sure when you call that you mention that you are scheduling your Annual Wellness Visit with the clinical pharmacist so that the appointment may be made for the proper length of time.     Continue current medications. Continue good therapeutic lifestyle changes which include good diet and exercise. Fall precautions discussed with patient. If an FOBT was given today- please return it to our front desk. If you are over 65 years old - you may need Prevnar 20 or the adult Pneumonia vaccine.  **Flu  shots are available--- please call and schedule a FLU-CLINIC appointment**  After your visit with [place] today you will receive a survey in the mail or online from [employer] regarding your care with [place]. Please take a moment to fill this out. Your feedback is very important to [place] as you can help [place] better understand your patient needs as well as improve your experience and satisfaction. WE CARE ABOUT YOU!!!   Watch diet more closely.  Avoid NSAIDs caffeine and highly spice foods Continue to take pantoprazole more regularly  and take 1 daily every day before breakfast and add a Pepcid AC and take this before dinner at nighttime at least for 4 weeks.  If the reflux persists we may need to talk to Dr. Carlean Purl. Drink plenty of fluids and stay well-hydrated Continue to work on losing weight through diet and exercise  Arrie Senate MD

## 2018-06-26 ENCOUNTER — Telehealth: Payer: Self-pay | Admitting: Family Medicine

## 2018-06-26 DIAGNOSIS — M19041 Primary osteoarthritis, right hand: Secondary | ICD-10-CM

## 2018-06-26 LAB — THYROID PANEL WITH TSH
FREE THYROXINE INDEX: 1.4 (ref 1.2–4.9)
T3 Uptake Ratio: 24 % (ref 24–39)
T4, Total: 5.9 ug/dL (ref 4.5–12.0)
TSH: 4.44 u[IU]/mL (ref 0.450–4.500)

## 2018-06-26 LAB — BMP8+EGFR
BUN/Creatinine Ratio: 25 (ref 12–28)
BUN: 17 mg/dL (ref 8–27)
CALCIUM: 9.3 mg/dL (ref 8.7–10.3)
CHLORIDE: 102 mmol/L (ref 96–106)
CO2: 24 mmol/L (ref 20–29)
Creatinine, Ser: 0.67 mg/dL (ref 0.57–1.00)
GFR calc Af Amer: 104 mL/min/{1.73_m2} (ref >59)
GFR calc non Af Amer: 91 mL/min/{1.73_m2} (ref >59)
Glucose: 82 mg/dL (ref 65–99)
POTASSIUM: 3.6 mmol/L (ref 3.5–5.2)
SODIUM: 142 mmol/L (ref 134–144)

## 2018-06-26 LAB — CBC WITH DIFFERENTIAL/PLATELET
BASOS ABS: 0 10*3/uL (ref 0.0–0.2)
Basos: 1 %
EOS (ABSOLUTE): 0.3 10*3/uL (ref 0.0–0.4)
Eos: 4 %
Hematocrit: 41.1 % (ref 34.0–46.6)
Hemoglobin: 13.7 g/dL (ref 11.1–15.9)
IMMATURE GRANS (ABS): 0 10*3/uL (ref 0.0–0.1)
Immature Granulocytes: 0 %
LYMPHS ABS: 1.9 10*3/uL (ref 0.7–3.1)
Lymphs: 26 %
MCH: 31.1 pg (ref 26.6–33.0)
MCHC: 33.3 g/dL (ref 31.5–35.7)
MCV: 93 fL (ref 79–97)
MONOS ABS: 0.5 10*3/uL (ref 0.1–0.9)
Monocytes: 7 %
Neutrophils Absolute: 4.6 10*3/uL (ref 1.4–7.0)
Neutrophils: 62 %
PLATELETS: 254 10*3/uL (ref 150–450)
RBC: 4.41 x10E6/uL (ref 3.77–5.28)
RDW: 12.2 % — AB (ref 12.3–15.4)
WBC: 7.3 10*3/uL (ref 3.4–10.8)

## 2018-06-26 LAB — URIC ACID: Uric Acid: 4 mg/dL (ref 2.5–7.1)

## 2018-06-26 LAB — LIPID PANEL
CHOLESTEROL TOTAL: 195 mg/dL (ref 100–199)
Chol/HDL Ratio: 3.3 ratio (ref 0.0–4.4)
HDL: 60 mg/dL (ref >39)
LDL Calculated: 118 mg/dL — ABNORMAL HIGH (ref 0–99)
Triglycerides: 83 mg/dL (ref 0–149)
VLDL CHOLESTEROL CAL: 17 mg/dL (ref 5–40)

## 2018-06-26 LAB — HEPATIC FUNCTION PANEL
ALBUMIN: 4.4 g/dL (ref 3.6–4.8)
ALT: 12 IU/L (ref 0–32)
AST: 14 IU/L (ref 0–40)
Alkaline Phosphatase: 97 IU/L (ref 39–117)
BILIRUBIN, DIRECT: 0.15 mg/dL (ref 0.00–0.40)
Bilirubin Total: 0.5 mg/dL (ref 0.0–1.2)
TOTAL PROTEIN: 6.7 g/dL (ref 6.0–8.5)

## 2018-06-26 LAB — VITAMIN D 25 HYDROXY (VIT D DEFICIENCY, FRACTURES): VIT D 25 HYDROXY: 37.7 ng/mL (ref 30.0–100.0)

## 2018-06-26 NOTE — Telephone Encounter (Signed)
Patient aware of results.

## 2018-07-03 ENCOUNTER — Encounter: Payer: Self-pay | Admitting: *Deleted

## 2018-07-03 ENCOUNTER — Telehealth: Payer: Self-pay | Admitting: *Deleted

## 2018-07-03 DIAGNOSIS — E7800 Pure hypercholesterolemia, unspecified: Secondary | ICD-10-CM

## 2018-07-03 NOTE — Telephone Encounter (Signed)
Letter of results sent to pt including diet info and lab orders

## 2018-07-03 NOTE — Telephone Encounter (Signed)
-----   Message from Lorretta Harp, MD sent at 06/29/2018  4:36 PM EST ----- FLP not quite at goal for primary prevention. Encourage heart healthy diet, provide AHA diet and re check in 3  months

## 2018-07-14 ENCOUNTER — Other Ambulatory Visit: Payer: Self-pay | Admitting: Family Medicine

## 2018-07-15 ENCOUNTER — Other Ambulatory Visit: Payer: Self-pay | Admitting: *Deleted

## 2018-07-15 MED ORDER — LOSARTAN POTASSIUM-HCTZ 100-12.5 MG PO TABS: 1.0000 | ORAL_TABLET | Freq: Every day | ORAL | 1 refills | Status: DC

## 2018-08-22 ENCOUNTER — Encounter: Payer: Self-pay | Admitting: *Deleted

## 2018-10-16 ENCOUNTER — Encounter (INDEPENDENT_AMBULATORY_CARE_PROVIDER_SITE_OTHER): Payer: Self-pay | Admitting: Orthopaedic Surgery

## 2018-10-16 ENCOUNTER — Ambulatory Visit (INDEPENDENT_AMBULATORY_CARE_PROVIDER_SITE_OTHER): Payer: Medicare Other | Admitting: Orthopaedic Surgery

## 2018-10-16 VITALS — BP 143/80 | HR 62 | Ht 61.0 in | Wt 225.0 lb

## 2018-10-16 DIAGNOSIS — M654 Radial styloid tenosynovitis [de Quervain]: Secondary | ICD-10-CM

## 2018-10-16 NOTE — Progress Notes (Signed)
Office Visit Note   Patient: Stacie Russell           Date of Birth: August 02, 1951           MRN: [id number] Visit Date: 10/16/2018              Requested by: Chipper Herb, MD [address] [place], [place] [address] PCP: Chipper Herb, MD   Assessment & Plan: Visit Diagnoses:  1. Radial styloid tenosynovitis    Injection performed right hand first dorsal compartment for De'Quervane's stenosing tenosynovitis.  Good relief postinjection.  We discussed pathophysiology questions elicited and answered if she has persistent problems she can return. Plan:   Follow-Up Instructions: Return if symptoms worsen or fail to improve.   Orders:  Orders Placed This Encounter  Procedures  . Hand/UE Inj   No orders of the defined types were placed in this encounter.     Procedures: Hand/UE Inj for de Quervain's tenosynovitis on 10/16/2018 11:47 AM Details: dorsal approach Medications: 0.3 mL lidocaine 1 %; 0.33 mL bupivacaine 0.25 %; 0.33 mL bupivacaine 0.5 %      Clinical Data: No additional findings.   Subjective: Chief Complaint  Patient presents with  . Right Hand - Pain    HPI 68 year old female with right hand pain along the radial side.  She has had some pain with gripping and pain at the base of her thumb.  She has been sleeping with Velcro splint she was previously diagnosed with carpal tunnel syndrome.  She is used braces at night which seems to help she is never had surgery.  She had previous [facility].  She is taken Tylenol and used ice but cannot take anti-inflammatories due to elevated creatinine.  No history of neck problems.  Review of Systems 14 point review of systems positive with reflux asthma bladder problems cataracts hypertension.  Never smoked does not consume alcohol.  Patient was referred by Dr. Redge Gainer.  X-rays 06/25/2018 shows osteoarthritis mild first CMC joint.  Negative for acute  injury.   Objective: Vital Signs: BP (!) 143/80   Pulse 62   Ht 5\' 1"  (1.549 m)   Wt 225 lb (102.1 kg)   BMI 42.51 kg/m   Physical Exam Constitutional:      Appearance: She is well-developed.  HENT:     Head: Normocephalic.     Right Ear: External ear normal.     Left Ear: External ear normal.  Eyes:     Pupils: Pupils are equal, round, and reactive to light.  Neck:     Thyroid: No thyromegaly.     Trachea: No tracheal deviation.  Cardiovascular:     Rate and Rhythm: Normal rate.  Pulmonary:     Effort: Pulmonary effort is normal.  Abdominal:     Palpations: Abdomen is soft.  Skin:    General: Skin is warm and dry.  Neurological:     Mental Status: She is alert and oriented to person, place, and time.  Psychiatric:        Behavior: Behavior normal.     Ortho Exam patient has good cervical range of motion no pain with cervical compression negative Spurling.  No instability of the shoulder.  Elbows reach full extension.  Ulnar median nerve at the cubital tunnel and proximal forearm are normal.  Some tenderness with carpal compression no thenar atrophy.  Mild tenderness palpation over the first [facility] joint.  Significant tenderness of the  first dorsal compartment none over the remaining dorsal compartments.  Positive Finkelstein test.  No palpable nodules or ganglions in the first dorsal compartment.  Specialty Comments:  No specialty comments available.  Imaging: No results found.   PMFS History: Patient Active Problem List   Diagnosis Date Noted  . OA (osteoarthritis) of knee 10/07/2017  . Obstructive sleep apnea 08/27/2017  . Oxygen deficiency   . Hypertension   . Spinal stenosis of lumbar region 03/08/2017  . Fatigue 02/15/2017  . Palpitations 01/16/2017  . Fundic gland polyps of stomach, benign 07/10/2015  . Family history of colon cancer in mother - dx 27's 07/04/2015  . Overactive bladder 12/29/2014  . Chest pain 10/22/2013  . Metabolic syndrome  92/44/6286  . Morbid obesity due to excess calories ([place]) 05/07/2013  . Essential hypertension, benign 11/03/2012  . Vitamin D deficiency 11/03/2012  . Knee pain   . Endometrial hyperplasia   . Hemorrhoid   . Foot pain   . Esophageal stricture   . Diverticulosis   . Hip pain   . Gastritis   . Tendonitis of ankle   . Hiatal hernia   . Hyperlipidemia   . Asthma   . GERD 10/03/2009   Past Medical History:  Diagnosis Date  . Arthritis   . Asthma   . Diverticulosis   . Endometrial hyperplasia   . Esophageal stricture   . Foot pain   . Fundic gland polyps of stomach, benign 07/10/2015  . Gastritis   . Hemorrhoid   . Hiatal hernia   . Hip pain   . Hyperlipidemia   . Hypertension   . Knee pain   . Metabolic syndrome   . Obstructive sleep apnea 08/27/2017  . Oxygen deficiency    pt denies  . Tendonitis of ankle   . Vitamin D deficiency     Family History  Problem Relation Age of Onset  . Cancer Mother        Liver, stomach, colon  . Colon cancer Mother   . Heart attack Father   . Heart disease Sister   . Breast cancer Sister   . Breast cancer Sister   . Esophageal cancer Neg Hx   . Stomach cancer Neg Hx   . Rectal cancer Neg Hx     Past Surgical History:  Procedure Laterality Date  . ANAL FISSURE REPAIR    . CATARACT EXTRACTION  May 2009  . COLONOSCOPY  12/26/07  . ESOPHAGOGASTRODUODENOSCOPY  12/26/07, 05/13/08  . EYE SURGERY     bilateral cataract with lens implants  . KNEE CARTILAGE SURGERY Right Sept 1999  . 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 UMBILICAL HERNIA REPAIR  05/20/06   Social History   Occupational History  . Occupation: Retired  Tobacco Use  . Smoking status: Never Smoker  . Smokeless tobacco: Never Used  Substance and Sexual Activity  . Alcohol use: No  . Drug use: No  . Sexual  activity: Not on file

## 2018-10-19 MED ORDER — BUPIVACAINE HCL 0.5 % IJ SOLN
0.3300 mL | INTRAMUSCULAR | Status: AC | PRN
  Administered 2018-10-16: .33 mL

## 2018-10-19 MED ORDER — LIDOCAINE HCL 1 % IJ SOLN
0.3000 mL | INTRAMUSCULAR | Status: AC | PRN
  Administered 2018-10-16: .3 mL

## 2018-10-19 MED ORDER — BUPIVACAINE HCL 0.25 % IJ SOLN
0.3300 mL | INTRAMUSCULAR | Status: AC | PRN
  Administered 2018-10-16: .33 mL

## 2018-10-24 DIAGNOSIS — M5136 Other intervertebral disc degeneration, lumbar region: Secondary | ICD-10-CM | POA: Insufficient documentation

## 2018-10-24 DIAGNOSIS — M48061 Spinal stenosis, lumbar region without neurogenic claudication: Secondary | ICD-10-CM | POA: Diagnosis not present

## 2018-12-25 ENCOUNTER — Other Ambulatory Visit: Payer: Self-pay

## 2018-12-25 ENCOUNTER — Ambulatory Visit (INDEPENDENT_AMBULATORY_CARE_PROVIDER_SITE_OTHER): Payer: Medicare Other | Admitting: Family Medicine

## 2018-12-25 DIAGNOSIS — E8881 Metabolic syndrome: Secondary | ICD-10-CM | POA: Diagnosis not present

## 2018-12-25 DIAGNOSIS — E039 Hypothyroidism, unspecified: Secondary | ICD-10-CM

## 2018-12-25 DIAGNOSIS — G4733 Obstructive sleep apnea (adult) (pediatric): Secondary | ICD-10-CM

## 2018-12-25 DIAGNOSIS — M159 Polyosteoarthritis, unspecified: Secondary | ICD-10-CM

## 2018-12-25 DIAGNOSIS — K219 Gastro-esophageal reflux disease without esophagitis: Secondary | ICD-10-CM

## 2018-12-25 DIAGNOSIS — M150 Primary generalized (osteo)arthritis: Secondary | ICD-10-CM

## 2018-12-25 DIAGNOSIS — E559 Vitamin D deficiency, unspecified: Secondary | ICD-10-CM

## 2018-12-25 DIAGNOSIS — E782 Mixed hyperlipidemia: Secondary | ICD-10-CM

## 2018-12-25 DIAGNOSIS — I10 Essential (primary) hypertension: Secondary | ICD-10-CM | POA: Diagnosis not present

## 2018-12-25 DIAGNOSIS — M48061 Spinal stenosis, lumbar region without neurogenic claudication: Secondary | ICD-10-CM

## 2018-12-25 DIAGNOSIS — Z800 Family history of malignant neoplasm of digestive organs: Secondary | ICD-10-CM

## 2018-12-25 NOTE — Patient Instructions (Addendum)
Continue to practice good hand and respiratory hygiene Continue to drink plenty of fluids and stay well-hydrated Continue to make every effort to lose weight through diet and exercise Please call the office and set up time to get lab work fasting. Wear personal protective equipment when coming to the office. When coming to the office for fasting blood work call and arrange that appointment.  Bring blood pressure readings from home and daily weights from home at that time and have the nurse get blood pressure in the office.

## 2018-12-25 NOTE — Progress Notes (Signed)
Virtual Visit Via telephone Note I connected with@ on 12/25/18 by telephone and verified that I am speaking with the correct person or authorized healthcare agent using two identifiers. Stacie Russell is currently located at home and there are no unauthorized people in close proximity. I completed this visit while in a private location in my home .  This visit type was conducted due to national recommendations for restrictions regarding the COVID-19 Pandemic (e.g. social distancing).  This format is felt to be most appropriate for this patient at this time.  All issues noted in this document were discussed and addressed.  No physical exam was performed.    I discussed the limitations, risks, security and privacy concerns of performing an evaluation and management service by telephone and the availability of in person appointments. I also discussed with the patient that there may be a patient responsible charge related to this service. The patient expressed understanding and agreed to proceed.   Date:  12/25/2018    ID:  [name]      [birth year]        [id number]   Patient Care Team Patient Care Team: Chipper Herb, MD as PCP - General (Family Medicine) Lorretta Harp, MD as PCP - Cardiology (Cardiology) Irine Seal, MD (Urology) Paula Compton, MD (Obstetrics and Gynecology) Gatha Mayer, MD (Gastroenterology) Gaynelle Arabian, MD as Consulting Physician (Orthopedic Surgery)  Reason for Visit: Primary Care Follow-up     History of Present Illness & Review of Systems:     Stacie Russell is a 68 y.o. year old female primary care patient that presents today for a telehealth visit.  The patient is pleasant and doing well and in questioning her about the past never got a report about her sleep apnea evaluation.  She will call the nurse that works with the doctor that had that done and try to get a copy of that result.  Today she denies any chest pain pressure  tightness or shortness of breath.  She does have occasional indigestion and continues to take Protonix and will take Tums if she has indigestion in addition to the Protonix.  She has not had any change in bowel habits and no blood in the stool that she has noted.  She will be due to get another colonoscopy in November 2021 because her mother had colon cancer.  This will be with Dr. Carlean Purl.  She is passing her water well without any complaints.  Previously she was having problems with feeling bad during the day and that is when the sleep apnea evaluation was done and she is feeling better and she is not doing anything about sleep apnea currently.  She is up-to-date on her mammograms.  Review of systems as stated, otherwise negative.  The patient does not have symptoms concerning for COVID-19 infection (fever, chills, cough, or new shortness of breath).      Current Medications (Verified) Allergies as of 12/25/2018   No Known Allergies     Medication List       Accurate as of Dec 25, 2018  8:23 AM. If you have any questions, ask your nurse or doctor.        acetaminophen 500 MG tablet Commonly known as:  TYLENOL Take 1,000 mg by mouth 2 (two) times daily as needed for moderate pain or headache.   albuterol 108 (90 Base) MCG/ACT inhaler Commonly known as:  VENTOLIN HFA Inhale 2 puffs into the lungs  every 6 (six) hours as needed for wheezing or shortness of breath.   aspirin EC 81 MG tablet Take 81 mg by mouth daily.   budesonide-formoterol 80-4.5 MCG/ACT inhaler Commonly known as:  SYMBICORT Inhale 2 puffs into the lungs 2 (two) times daily.   DULoxetine 30 MG capsule Commonly known as:  Cymbalta Take 1 capsule (30 mg total) by mouth daily.   Fish Oil 1000 MG Caps Take 2 capsules by mouth daily.   furosemide 20 MG tablet Commonly known as:  LASIX TAKE 1 TABLET BY MOUTH  DAILY AS NEEDED   levothyroxine 25 MCG tablet Commonly known as:  SYNTHROID TAKE 1 TABLET BY MOUTH   DAILY BEFORE BREAKFAST   losartan-hydrochlorothiazide 100-12.5 MG tablet Commonly known as:  HYZAAR Take 1 tablet by mouth daily.   oxybutynin 5 MG tablet Commonly known as:  DITROPAN Take 2 tablets (10 mg total) by mouth 2 (two) times daily.   pantoprazole 40 MG tablet Commonly known as:  PROTONIX TAKE 1 TABLET BY MOUTH ONCE DAILY BEFORE BREAKFAST   Vitamin D3 50 MCG (2000 UT) Tabs Take 1 tablet by mouth daily.           Allergies (Verified)    Patient has no known allergies.  Past Medical History Past Medical History:  Diagnosis Date  . Arthritis   . Asthma   . Diverticulosis   . Endometrial hyperplasia   . Esophageal stricture   . Foot pain   . Fundic gland polyps of stomach, benign 07/10/2015  . Gastritis   . Hemorrhoid   . Hiatal hernia   . Hip pain   . Hyperlipidemia   . Hypertension   . Knee pain   . Metabolic syndrome   . Obstructive sleep apnea 08/27/2017  . Oxygen deficiency    pt denies  . Tendonitis of ankle   . Vitamin D deficiency      Past Surgical History:  Procedure Laterality Date  . ANAL FISSURE REPAIR    . CATARACT EXTRACTION  May 2009  . COLONOSCOPY  12/26/07  . ESOPHAGOGASTRODUODENOSCOPY  12/26/07, 05/13/08  . EYE SURGERY     bilateral cataract with lens implants  . KNEE CARTILAGE SURGERY Right Sept 1999  . 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 UMBILICAL HERNIA REPAIR  05/20/06    Social History   Socioeconomic History  . Marital status: Married    Spouse name: Marcello Moores  . Number of children: Not on file  . Years of education: Not on file  . Highest education level: Not on file  Occupational History  . Occupation: Retired  Scientific laboratory technician  . Financial resource strain: Not on file  . Food insecurity:    Worry: Not on file    Inability: Not on file  . Transportation needs:     Medical: Not on file    Non-medical: Not on file  Tobacco Use  . Smoking status: Never Smoker  . Smokeless tobacco: Never Used  Substance and Sexual Activity  . Alcohol use: No  . Drug use: No  . Sexual activity: Not on file  Lifestyle  . Physical activity:    Days per week: Not on file    Minutes per session: Not on file  . Stress: Not on file  Relationships  . Social connections:    Talks on phone: Not on  file    Gets together: Not on file    Attends religious service: Not on file    Active member of club or organization: Not on file    Attends meetings of clubs or organizations: Not on file    Relationship status: Not on file  Other Topics Concern  . Not on file  Social History Narrative   Married and retired   No EtOH, tobacco or drug use     Family History  Problem Relation Age of Onset  . Cancer Mother        Liver, stomach, colon  . Colon cancer Mother   . Heart attack Father   . Heart disease Sister   . Breast cancer Sister   . Breast cancer Sister   . Esophageal cancer Neg Hx   . Stomach cancer Neg Hx   . Rectal cancer Neg Hx       Labs/Other Tests and Data Reviewed:    Wt Readings from Last 3 Encounters:  10/16/18 225 lb (102.1 kg)  06/25/18 214 lb (97.1 kg)  04/21/18 205 lb (93 kg)   Temp Readings from Last 3 Encounters:  06/25/18 (!) 97.4 F (36.3 C) (Oral)  12/18/17 (!) 97.2 F (36.2 C) (Oral)  11/28/17 98 F (36.7 C)   BP Readings from Last 3 Encounters:  10/16/18 (!) 143/80  06/25/18 (!) 166/102  04/21/18 123/65   Pulse Readings from Last 3 Encounters:  10/16/18 62  06/25/18 68  04/21/18 77     Lab Results  Component Value Date   HGBA1C 5.4 01/03/2017   HGBA1C 5.3 06/25/2014   Lab Results  Component Value Date   LDLCALC 118 (H) 06/25/2018   CREATININE 0.67 06/25/2018       Chemistry      Component Value Date/Time   NA 142 06/25/2018 1020   K 3.6 06/25/2018 1020   CL 102 06/25/2018 1020   CO2 24 06/25/2018 1020    BUN 17 06/25/2018 1020   CREATININE 0.67 06/25/2018 1020   CREATININE 0.60 11/03/2012 1228   GLU 81 07/24/2012      Component Value Date/Time   CALCIUM 9.3 06/25/2018 1020   ALKPHOS 97 06/25/2018 1020   AST 14 06/25/2018 1020   ALT 12 06/25/2018 1020   BILITOT 0.5 06/25/2018 1020         OBSERVATIONS/ OBJECTIVE:     The patient says that she is been trapped in her house like many others her age and has been cooking and eating and not getting a lot of exercise.  Her blood pressures at home have been running in the 1 50-1 40 range over the 90-102 range.  These readings are too high.  She will check some more readings and bring these readings with her when she comes to the office to get her blood work.  She will also have the nurse to check the blood pressure in the office when she comes.  She says her weight is 230 pounds.  Physical exam deferred due to nature of telephonic visit.  ASSESSMENT & PLAN    Time:   Today, I have spent 27 minutes with the patient via telephone discussing the above including Covid precautions.     Visit Diagnoses: 1. Hypothyroidism, unspecified type -Continue current treatment pending results of lab work  2. Gastroesophageal reflux disease, esophagitis presence not specified -Patient still has occasional indigestion at times and takes Tums on an as-needed basis in addition to her Protonix.  Her next  colonoscopy is not due until November 2021 by Dr. Carlean Purl.  3. Metabolic syndrome -She should continue to make every effort to lose weight with diet and exercise  4. Morbid obesity due to excess calories ([place]) -Patient's weight currently is about 230 pounds.  She needs to continue to make every effort to lose weight through diet and exercise.  5. Essential hypertension, benign -Current blood pressure readings seem to be running higher than usual.  The patient will keep a better record over the next few days of blood pressure readings twice daily and  daily weights and when she comes to the office she will bring these readings with her when she comes to get blood work.  The nurse will check her blood pressure at the office.  If it is running high in the home readings are still high we will have to increase some of her medicines.  She should continue to watch her sodium intake.  6. Primary osteoarthritis involving multiple joints -Continue Tylenol as needed for joint pain  7. Vitamin D deficiency -Continue vitamin D replacement  8. Mixed hyperlipidemia -Continue aggressive therapeutic lifestyle changes geared to weight loss  9. Obstructive sleep apnea -Patient will call and get results of the study that was done  10. Family history of colon cancer in mother - dx 67's -Repeat colonoscopy is due in November 2021  11. Spinal stenosis of lumbar region, unspecified whether neurogenic claudication present -Continue to make every effort to lose weight through diet and exercise  Patient Instructions  Continue to practice good hand and respiratory hygiene Continue to drink plenty of fluids and stay well-hydrated Continue to make every effort to lose weight through diet and exercise Please call the office and set up time to get lab work fasting. Wear personal protective equipment when coming to the office. When coming to the office for fasting blood work call and arrange that appointment.  Bring blood pressure readings from home and daily weights from home at that time and have the nurse get blood pressure in the office.     The above assessment and management plan was discussed with the patient. The patient verbalized understanding of and has agreed to the management plan. Patient is aware to call the clinic if symptoms persist or worsen. Patient is aware when to return to the clinic for a follow-up visit. Patient educated on when it is appropriate to go to the emergency department.    Chipper Herb, MD [address], [place], [place] [address] Ph [phone]   Arrie Senate MD

## 2018-12-25 NOTE — Addendum Note (Signed)
Addended by: Zannie Cove on: 12/25/2018 11:19 AM   Modules accepted: Orders

## 2019-01-07 ENCOUNTER — Telehealth: Payer: Self-pay | Admitting: Cardiovascular Disease

## 2019-01-07 NOTE — Telephone Encounter (Signed)
smartphone/ my chart/ consent/ pre reg completed  °

## 2019-01-08 ENCOUNTER — Other Ambulatory Visit: Payer: Medicare Other

## 2019-01-08 ENCOUNTER — Other Ambulatory Visit: Payer: Self-pay

## 2019-01-08 DIAGNOSIS — E039 Hypothyroidism, unspecified: Secondary | ICD-10-CM | POA: Diagnosis not present

## 2019-01-08 DIAGNOSIS — M150 Primary generalized (osteo)arthritis: Secondary | ICD-10-CM | POA: Diagnosis not present

## 2019-01-08 DIAGNOSIS — E559 Vitamin D deficiency, unspecified: Secondary | ICD-10-CM | POA: Diagnosis not present

## 2019-01-08 DIAGNOSIS — I10 Essential (primary) hypertension: Secondary | ICD-10-CM

## 2019-01-08 DIAGNOSIS — E8881 Metabolic syndrome: Secondary | ICD-10-CM

## 2019-01-08 DIAGNOSIS — E782 Mixed hyperlipidemia: Secondary | ICD-10-CM

## 2019-01-08 DIAGNOSIS — Z800 Family history of malignant neoplasm of digestive organs: Secondary | ICD-10-CM

## 2019-01-08 DIAGNOSIS — M48061 Spinal stenosis, lumbar region without neurogenic claudication: Secondary | ICD-10-CM

## 2019-01-08 DIAGNOSIS — G4733 Obstructive sleep apnea (adult) (pediatric): Secondary | ICD-10-CM

## 2019-01-08 DIAGNOSIS — K219 Gastro-esophageal reflux disease without esophagitis: Secondary | ICD-10-CM | POA: Diagnosis not present

## 2019-01-08 DIAGNOSIS — M159 Polyosteoarthritis, unspecified: Secondary | ICD-10-CM

## 2019-01-09 ENCOUNTER — Telehealth: Payer: Self-pay

## 2019-01-09 ENCOUNTER — Encounter: Payer: Self-pay | Admitting: Cardiovascular Disease

## 2019-01-09 ENCOUNTER — Telehealth (INDEPENDENT_AMBULATORY_CARE_PROVIDER_SITE_OTHER): Payer: Medicare Other | Admitting: Cardiovascular Disease

## 2019-01-09 DIAGNOSIS — N3281 Overactive bladder: Secondary | ICD-10-CM

## 2019-01-09 DIAGNOSIS — R002 Palpitations: Secondary | ICD-10-CM

## 2019-01-09 DIAGNOSIS — I10 Essential (primary) hypertension: Secondary | ICD-10-CM | POA: Diagnosis not present

## 2019-01-09 LAB — CBC WITH DIFFERENTIAL/PLATELET
Basophils Absolute: 0 10*3/uL (ref 0.0–0.2)
Basos: 0 %
EOS (ABSOLUTE): 0.2 10*3/uL (ref 0.0–0.4)
Eos: 4 %
Hematocrit: 40.8 % (ref 34.0–46.6)
Hemoglobin: 13.7 g/dL (ref 11.1–15.9)
Immature Grans (Abs): 0 10*3/uL (ref 0.0–0.1)
Immature Granulocytes: 0 %
Lymphocytes Absolute: 1.9 10*3/uL (ref 0.7–3.1)
Lymphs: 28 %
MCH: 31.1 pg (ref 26.6–33.0)
MCHC: 33.6 g/dL (ref 31.5–35.7)
MCV: 93 fL (ref 79–97)
Monocytes Absolute: 0.4 10*3/uL (ref 0.1–0.9)
Monocytes: 5 %
Neutrophils Absolute: 4.2 10*3/uL (ref 1.4–7.0)
Neutrophils: 63 %
Platelets: 229 10*3/uL (ref 150–450)
RBC: 4.4 x10E6/uL (ref 3.77–5.28)
RDW: 12.7 % (ref 11.7–15.4)
WBC: 6.8 10*3/uL (ref 3.4–10.8)

## 2019-01-09 LAB — HEPATIC FUNCTION PANEL
ALT: 13 IU/L (ref 0–32)
AST: 13 IU/L (ref 0–40)
Albumin: 4.4 g/dL (ref 3.8–4.8)
Alkaline Phosphatase: 99 IU/L (ref 39–117)
Bilirubin Total: 0.5 mg/dL (ref 0.0–1.2)
Bilirubin, Direct: 0.12 mg/dL (ref 0.00–0.40)
Total Protein: 6.6 g/dL (ref 6.0–8.5)

## 2019-01-09 LAB — BMP8+EGFR
BUN/Creatinine Ratio: 24 (ref 12–28)
BUN: 15 mg/dL (ref 8–27)
CO2: 22 mmol/L (ref 20–29)
Calcium: 9.3 mg/dL (ref 8.7–10.3)
Chloride: 99 mmol/L (ref 96–106)
Creatinine, Ser: 0.62 mg/dL (ref 0.57–1.00)
GFR calc Af Amer: 106 mL/min/{1.73_m2} (ref >59)
GFR calc non Af Amer: 92 mL/min/{1.73_m2} (ref >59)
Glucose: 81 mg/dL (ref 65–99)
Potassium: 4.1 mmol/L (ref 3.5–5.2)
Sodium: 140 mmol/L (ref 134–144)

## 2019-01-09 LAB — THYROID PANEL WITH TSH
Free Thyroxine Index: 1.5 (ref 1.2–4.9)
T3 Uptake Ratio: 24 % (ref 24–39)
T4, Total: 6.1 ug/dL (ref 4.5–12.0)
TSH: 3.7 u[IU]/mL (ref 0.450–4.500)

## 2019-01-09 LAB — LIPID PANEL
Chol/HDL Ratio: 3.2 ratio (ref 0.0–4.4)
Cholesterol, Total: 200 mg/dL — ABNORMAL HIGH (ref 100–199)
HDL: 63 mg/dL (ref >39)
LDL Calculated: 121 mg/dL — ABNORMAL HIGH (ref 0–99)
Triglycerides: 79 mg/dL (ref 0–149)
VLDL Cholesterol Cal: 16 mg/dL (ref 5–40)

## 2019-01-09 LAB — VITAMIN D 25 HYDROXY (VIT D DEFICIENCY, FRACTURES): Vit D, 25-Hydroxy: 30.6 ng/mL (ref 30.0–100.0)

## 2019-01-09 NOTE — Telephone Encounter (Signed)
Patient and/or DPR-approved person aware of AVS instructions and verbalized understanding. Letter including After Visit Summary and any other necessary documents to be mailed to the patient's address on file.  

## 2019-01-09 NOTE — Progress Notes (Signed)
Virtual Visit via Telephone Note   This visit type was conducted due to national recommendations for restrictions regarding the COVID-19 Pandemic (e.g. social distancing) in an effort to limit this patient's exposure and mitigate transmission in our community.  Due to her co-morbid illnesses, this patient is at least at moderate risk for complications without adequate follow up.  This format is felt to be most appropriate for this patient at this time.  The patient did not have access to video technology/had technical difficulties with video requiring transitioning to audio format only (telephone).  All issues noted in this document were discussed and addressed.  No physical exam could be performed with this format.  Please refer to the patient's chart for her  consent to telehealth for [facility].   Date:  01/09/2019   ID:  Stacie Russell, Stacie Russell 04-May-1951, MRN [id number]  Patient Location: Home Provider Location: Home  PCP:  Chipper Herb, MD  Cardiologist:  Quay Burow, MD  Electrophysiologist:  None   Evaluation Performed:  Follow-Up Visit  Chief Complaint: Fatigue and palpitations, hypertension  History of Present Illness:    Stacie Russell is a 68 y.o.  married Caucasian female, mother of 2.  Her sister was Darnelle Bos, also a patient of mine, who recently passed away. She was referred to me by Dr. Laurance Flatten for cardiovascular evaluation because of excessive fatigue and palpitations. I last saw her in the office 08/27/2017. Her only risk factor for heart disease is treated hypertension and family history. She's never had a heart attack or stroke. She denies chest pain or shortness of breath. Her major complaint is of severe fatigue worse over the last several months but present over the last 5-6 years. She does have GERD. I performed Myoview stress testing and 2-D echocardiography all of which were unrevealing. A sleep study did show obstructive sleep apnea and she has  undergone CPAP titration. She also feels somewhat quickly improved.  She underwent uncomplicated right total knee replacement by Dr. Wynelle Link 10/08/2017 and has done well since then.  Since I saw her back a year and a half ago her initial symptoms of fatigue and palpitations have resolved.  Prior to COVID-19 she was going to the gym and exercising regularly.  She denies chest pain or shortness of breath.   The patient does not have symptoms concerning for COVID-19 infection (fever, chills, cough, or new shortness of breath).    Past Medical History:  Diagnosis Date  . Arthritis   . Asthma   . Diverticulosis   . Endometrial hyperplasia   . Esophageal stricture   . Foot pain   . Fundic gland polyps of stomach, benign 07/10/2015  . Gastritis   . Hemorrhoid   . Hiatal hernia   . Hip pain   . Hyperlipidemia   . Hypertension   . Knee pain   . Metabolic syndrome   . Obstructive sleep apnea 08/27/2017  . Oxygen deficiency    pt denies  . Tendonitis of ankle   . Vitamin D deficiency    Past Surgical History:  Procedure Laterality Date  . ANAL FISSURE REPAIR    . CATARACT EXTRACTION  May 2009  . COLONOSCOPY  12/26/07  . ESOPHAGOGASTRODUODENOSCOPY  12/26/07, 05/13/08  . EYE SURGERY     bilateral cataract with lens implants  . KNEE CARTILAGE SURGERY Right Sept 1999  . 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 UMBILICAL HERNIA REPAIR  05/20/06     No outpatient medications have been marked as taking for the 01/09/19 encounter (Appointment) with Lorretta Harp, MD.     Allergies:   Patient has no known allergies.   Social History   Tobacco Use  . Smoking status: Never Smoker  . Smokeless tobacco: Never Used  Substance Use Topics  . Alcohol use: No  . Drug use: No     Family Hx: The patient's family history includes Breast  cancer in her sister and sister; Cancer in her mother; Colon cancer in her mother; Heart attack in her father; Heart disease in her sister. There is no history of Esophageal cancer, Stomach cancer, or Rectal cancer.  ROS:   Please see the history of present illness.     All other systems reviewed and are negative.   Prior CV studies:   The following studies were reviewed today:  None  Labs/Other Tests and Data Reviewed:    EKG:  No ECG reviewed.  Recent Labs: 01/08/2019: ALT 13; BUN 15; Creatinine, Ser 0.62; Hemoglobin 13.7; Platelets 229; Potassium 4.1; Sodium 140; TSH 3.700   Recent Lipid Panel Lab Results  Component Value Date/Time   CHOL 200 (H) 01/08/2019 11:56 AM   TRIG 79 01/08/2019 11:56 AM   TRIG 69 01/03/2017 08:50 AM   HDL 63 01/08/2019 11:56 AM   HDL 60 01/03/2017 08:50 AM   CHOLHDL 3.2 01/08/2019 11:56 AM   LDLCALC 121 (H) 01/08/2019 11:56 AM   LDLCALC 95 05/06/2013 10:06 AM    Wt Readings from Last 3 Encounters:  10/16/18 225 lb (102.1 kg)  06/25/18 214 lb (97.1 kg)  04/21/18 205 lb (93 kg)     Objective:    Vital Signs:  There were no vitals taken for this visit.   VITAL SIGNS:  reviewed a complete physical exam was not performed today since this was a virtual telemedicine phone visit  ASSESSMENT & PLAN:    1. Essential hypertension- history of essential hypertension blood pressure measured by the patient today at home of 136/82 with a pulse of 74.  She is on losartan and hydrochlorothiazide 2. Hyperlipidemia- history of hyperlipidemia not on statin therapy with lipid profile performed 01/08/2019 revealing LDL of 121 and HDL 63 3. Obstructive sleep apnea- history of having had sleep studies which were never followed up on.  COVID-19 Education: The signs and symptoms of COVID-19 were discussed with the patient and how to seek care for testing (follow up with PCP or arrange E-visit).  The importance of social distancing was discussed today.  Time:    Today, I have spent 4 minutes with the patient with telehealth technology discussing the above problems.     Medication Adjustments/Labs and Tests Ordered: Current medicines are reviewed at length with the patient today.  Concerns regarding medicines are outlined above.   Tests Ordered: No orders of the defined types were placed in this encounter.   Medication Changes: No orders of the defined types were placed in this encounter.   Disposition:  Follow up prn  Signed, Quay Burow, MD  01/09/2019 7:35 AM    [facility]

## 2019-01-09 NOTE — Progress Notes (Signed)
Please have patient follow-up on her sleep studies

## 2019-01-09 NOTE — Patient Instructions (Signed)
Medication Instructions:  Your physician recommends that you continue on your current medications as directed. Please refer to the Current Medication list given to you today.  If you need a refill on your cardiac medications before your next appointment, please call your pharmacy.   Lab work: NONE If you have labs (blood work) drawn today and your tests are completely normal, you will receive your results only by: . MyChart Message (if you have MyChart) OR . A paper copy in the mail If you have any lab test that is abnormal or we need to change your treatment, we will call you to review the results.  Testing/Procedures: NONE  Follow-Up: At [facility], you and your health needs are our priority.  As part of our continuing mission to provide you with exceptional heart care, we have created designated Provider Care Teams.  These Care Teams include your primary Cardiologist (physician) and Advanced Practice Providers (APPs -  Physician Assistants and Nurse Practitioners) who all work together to provide you with the care you need, when you need it. . You may schedule a follow up appointment AS NEEDED. You may see Dr. Berry or one of the following Advanced Practice Providers on your designated Care Team:   . Luke Kilroy, PA-C . Hao Meng, PA-C . Angela Duke, PA-C . Kathryn Lawrence, DNP . Rhonda Barrett, PA-C . Krista Kroeger, PA-C . Callie Goodrich, PA-C    

## 2019-01-15 ENCOUNTER — Other Ambulatory Visit: Payer: Self-pay | Admitting: Family Medicine

## 2019-02-17 ENCOUNTER — Other Ambulatory Visit: Payer: Self-pay | Admitting: Family Medicine

## 2019-04-07 ENCOUNTER — Other Ambulatory Visit: Payer: Self-pay | Admitting: Family Medicine

## 2019-04-08 ENCOUNTER — Other Ambulatory Visit: Payer: Self-pay | Admitting: Physician Assistant

## 2019-04-29 ENCOUNTER — Ambulatory Visit (INDEPENDENT_AMBULATORY_CARE_PROVIDER_SITE_OTHER): Payer: Medicare Other | Admitting: *Deleted

## 2019-04-29 DIAGNOSIS — Z0000 Encounter for general adult medical examination without abnormal findings: Secondary | ICD-10-CM

## 2019-04-29 NOTE — Progress Notes (Signed)
MEDICARE ANNUAL WELLNESS VISIT  04/29/2019  Telephone Visit Disclaimer This Medicare AWV was conducted by telephone due to national recommendations for restrictions regarding the COVID-19 Pandemic (e.g. social distancing).  I verified, using two identifiers, that I am speaking with Stacie Russell or their authorized healthcare agent. I discussed the limitations, risks, security, and privacy concerns of performing an evaluation and management service by telephone and the potential availability of an in-person appointment in the future. The patient expressed understanding and agreed to proceed.   Subjective:  Stacie Russell is a 68 y.o. female patient of Janora Norlander, DO who had a Medicare Annual Wellness Visit today via telephone. Stacie Russell is Retired and lives with their spouse. she has 2 children. she reports that she is socially active and does interact with friends/family regularly. she is minimally physically active and enjoys cooking, baking, canning vegetables and fruits and making preserves.  Patient Care Team: Janora Norlander, DO as PCP - General (Family Medicine) Lorretta Harp, MD as PCP - Cardiology (Cardiology) Irine Seal, MD (Urology) Paula Compton, MD (Obstetrics and Gynecology) Gatha Mayer, MD (Gastroenterology) Gaynelle Arabian, MD as Consulting Physician (Orthopedic Surgery)  Advanced Directives 04/29/2019 04/22/2018 04/22/2018 10/07/2017 10/07/2017 10/01/2017 08/15/2017  Does Patient Have a Medical Advance Directive? Yes Yes - Yes Yes Yes Yes  Type of Advance Directive Living will - Living will [place];Living will [place];Living will [place];Living will Living will  Does patient want to make changes to medical advance directive? No - Patient declined Yes (MAU/Ambulatory/Procedural Areas - Information given) No - Patient declined No - Patient declined No - Patient declined No - Patient declined  No - Patient declined  Copy of [place] in Chart? - - - No - copy requested No - copy requested No - copy requested [facility] Utilization Over the Past 12 Months: # of hospitalizations or ER visits: 0 # of surgeries: 0  Review of Systems    Patient reports that her overall health is unchanged compared to last year.  History obtained from chart review  Patient Reported Readings (BP, Pulse, CBG, Weight, etc) none  Pain Assessment Pain : No/denies pain     Current Medications & Allergies (verified) Allergies as of 04/29/2019   No Known Allergies     Medication List       Accurate as of April 29, 2019 11:08 AM. If you have any questions, ask your nurse or doctor.        acetaminophen 500 MG tablet Commonly known as: TYLENOL Take 500 mg by mouth every 6 (six) hours as needed.   albuterol 108 (90 Base) MCG/ACT inhaler Commonly known as: VENTOLIN HFA Inhale 2 puffs into the lungs every 6 (six) hours as needed for wheezing or shortness of breath.   aspirin EC 81 MG tablet Take 81 mg by mouth daily.   budesonide-formoterol 80-4.5 MCG/ACT inhaler Commonly known as: SYMBICORT Inhale 2 puffs into the lungs 2 (two) times daily.   DULoxetine 30 MG capsule Commonly known as: CYMBALTA TAKE 1 CAPSULE BY MOUTH  DAILY   Fish Oil 1000 MG Caps Take 1 capsule by mouth daily.   furosemide 20 MG tablet Commonly known as: LASIX TAKE 1 TABLET BY MOUTH  DAILY AS NEEDED   levothyroxine 25 MCG tablet Commonly known as: SYNTHROID TAKE 1 TABLET BY MOUTH  DAILY BEFORE BREAKFAST   losartan-hydrochlorothiazide 100-12.5 MG tablet Commonly known  asKonrad Penta Take 1 tablet by mouth daily.   oxybutynin 5 MG tablet Commonly known as: DITROPAN TAKE 2 TABLETS BY MOUTH  TWICE DAILY   pantoprazole 40 MG tablet Commonly known as: PROTONIX TAKE 1 TABLET BY MOUTH ONCE DAILY BEFORE BREAKFAST   Vitamin D3 50 MCG (2000 UT) Tabs Take 1 tablet by mouth daily.        History (reviewed): Past Medical History:  Diagnosis Date  . Arthritis   . Asthma   . Diverticulosis   . Endometrial hyperplasia   . Esophageal stricture   . Foot pain   . Fundic gland polyps of stomach, benign 07/10/2015  . Gastritis   . Hemorrhoid   . Hiatal hernia   . Hip pain   . Hyperlipidemia   . Hypertension   . Knee pain   . Metabolic syndrome   . Obstructive sleep apnea 08/27/2017  . Oxygen deficiency    pt denies  . Tendonitis of ankle   . Vitamin D deficiency    Past Surgical History:  Procedure Laterality Date  . ANAL FISSURE REPAIR    . CATARACT EXTRACTION  May 2009  . COLONOSCOPY  12/26/07  . ESOPHAGOGASTRODUODENOSCOPY  12/26/07, 05/13/08  . EYE SURGERY     bilateral cataract with lens implants  . KNEE CARTILAGE SURGERY Right Sept 1999  . 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 UMBILICAL HERNIA REPAIR  05/20/06   Family History  Problem Relation Age of Onset  . Cancer Mother        Liver, stomach, colon  . Colon cancer Mother   . Heart attack Father   . Heart disease Sister   . Breast cancer Sister   . Breast cancer Sister   . Esophageal cancer Neg Hx   . Stomach cancer Neg Hx   . Rectal cancer Neg Hx    Social History   Socioeconomic History  . Marital status: Married    Spouse name: Marcello Moores  . Number of children: 2  . Years of education: 48  . Highest education level: Associate degree: academic program  Occupational History  . Occupation: Retired  Scientific laboratory technician  . Financial resource strain: Not hard at all  . Food insecurity    Worry: Never true    Inability: Never true  . Transportation needs    Medical: No    Non-medical: No  Tobacco Use  . Smoking status: Never Smoker  . Smokeless tobacco: Never Used  Substance and Sexual Activity  . Alcohol use: No  . Drug use: No  . Sexual  activity: Not Currently    Birth control/protection: Post-menopausal  Lifestyle  . Physical activity    Days per week: 0 days    Minutes per session: 0 min  . Stress: Not at all  Relationships  . Social connections    Talks on phone: More than three times a week    Gets together: More than three times a week    Attends religious service: More than 4 times per year    Active member of club or organization: Yes    Attends meetings of clubs or organizations: More than 4 times per year    Relationship status: Married  Other Topics Concern  . Not on file  Social History Narrative   Married and retired   No EtOH, tobacco or drug  use    Activities of Daily Living In your present state of health, do you have any difficulty performing the following activities: 04/29/2019  Hearing? N  Vision? N  Difficulty concentrating or making decisions? N  Walking or climbing stairs? Y  Comment she is able to walk on flat surfaces without a problem but stairs are getting more difficult, she doesn't need a can or walker  Dressing or bathing? N  Doing errands, shopping? N  Preparing Food and eating ? N  Using the Toilet? N  In the past six months, have you accidently leaked urine? Y  Comment pt takes oxybutynin for this  Do you have problems with loss of bowel control? N  Managing your Medications? N  Managing your Finances? N  Housekeeping or managing your Housekeeping? N  Some recent data might be hidden    Patient Education/ Literacy How often do you need to have someone help you when you read instructions, pamphlets, or other written materials from your doctor or pharmacy?: 1 - Never What is the last grade level you completed in school?: Associates Degree-[employer]  Exercise Current Exercise Habits: The patient does not participate in regular exercise at present, Exercise limited by: orthopedic condition(s)  Diet Patient reports consuming 3 meals a day and 1  snack(s) a day Patient reports that her primary diet is: Regular Patient reports that she does have regular access to food.   Depression Screen PHQ 2/9 Scores 04/29/2019 06/25/2018 04/22/2018 12/18/2017 07/15/2017 03/11/2017 02/08/2017  PHQ - 2 Score 0 0 0 0 0 0 0     Fall Risk Fall Risk  04/29/2019 06/25/2018 04/22/2018 12/18/2017 07/15/2017  Falls in the past year? 0 0 No No No  Number falls in past yr: 0 - - - -  Injury with Fall? 0 - - - -  Follow up Falls prevention discussed - - - -  Comment Get rid of all throw rugs in the house, adequate lighting in the walk ways and grab bars in the bathroom. - - - -     Objective:  Stacie Russell seemed alert and oriented and she participated appropriately during our telephone visit.  Blood Pressure Weight BMI  BP Readings from Last 3 Encounters:  01/09/19 136/82  10/16/18 (!) 143/80  06/25/18 (!) 166/102   Wt Readings from Last 3 Encounters:  01/09/19 231 lb (104.8 kg)  10/16/18 225 lb (102.1 kg)  06/25/18 214 lb (97.1 kg)   BMI Readings from Last 1 Encounters:  01/09/19 42.25 kg/m    *Unable to obtain current vital signs, weight, and BMI due to telephone visit type  Hearing/Vision  . Stacie Russell did not seem to have difficulty with hearing/understanding during the telephone conversation . Reports that she has had a formal eye exam by an eye care professional within the past year . Reports that she has not had a formal hearing evaluation within the past year *Unable to fully assess hearing and vision during telephone visit type  Cognitive Function: 6CIT Screen 04/29/2019  What Year? 0 points  What month? 0 points  What time? 0 points  Count back from 20 0 points  Months in reverse 0 points  Repeat phrase 0 points  Total Score 0   (Normal:0-7, Significant for Dysfunction: >8)  Normal Cognitive Function Screening: Yes   Immunization & Health Maintenance Record Immunization History  Administered Date(s) Administered  . Influenza  Whole 07/25/2012  . Influenza, High Dose Seasonal PF 06/11/2017, 06/25/2018  .  Influenza,inj,Quad PF,6+ Mos 05/06/2013, 06/25/2014, 06/20/2015  . Pneumococcal Conjugate-13 05/06/2013  . Pneumococcal Polysaccharide-23 09/05/2016  . Tdap 04/26/2011  . Zoster 04/26/2011    Health Maintenance  Topic Date Due  . Hepatitis C Screening  [birth year]  . INFLUENZA VACCINE  03/14/2019  . MAMMOGRAM  04/29/2020  . COLONOSCOPY  07/03/2020  . TETANUS/TDAP  04/25/2021  . DEXA SCAN  Completed  . PNA vac Low Risk Adult  Completed       Assessment  This is a routine wellness examination for Stacie Russell.  Health Maintenance: Due or Overdue Health Maintenance Due  Topic Date Due  . Hepatitis C Screening  1950/10/21  . INFLUENZA VACCINE  03/14/2019    Stacie Russell does not need a referral for Community Assistance: Care Management:   no Social Work:    no Prescription Assistance:  no Nutrition/Diabetes Education:  no   Plan:  Personalized Goals Goals Addressed            This Visit's Progress   . DIET - INCREASE WATER INTAKE       Try to drink 6-8 glasses of water daily.      Personalized Health Maintenance & Screening Recommendations  Influenza vaccine  Lung Cancer Screening Recommended: no (Low Dose CT Chest recommended if Age 67-80 years, 30 pack-year currently smoking OR have quit w/in past 15 years) Hepatitis C Screening recommended: yes HIV Screening recommended: no  Advanced Directives: Written information was not prepared per patient's request.  Referrals & Orders No orders of the defined types were placed in this encounter.   Follow-up Plan . Follow-up with Janora Norlander, DO as planned . Get your Flu vaccine at your next visit with your PCP   I have personally reviewed and noted the following in the patient's chart:   . Medical and social history . Use of alcohol, tobacco or illicit drugs  . Current medications and supplements . Functional  ability and status . Nutritional status . Physical activity . Advanced directives . List of other physicians . Hospitalizations, surgeries, and ER visits in previous 12 months . Vitals . Screenings to include cognitive, depression, and falls . Referrals and appointments  In addition, I have reviewed and discussed with Stacie Russell certain preventive protocols, quality metrics, and best practice recommendations. A written personalized care plan for preventive services as well as general preventive health recommendations is available and can be mailed to the patient at her request.      Marylin Crosby, LPN  [id number]

## 2019-04-29 NOTE — Patient Instructions (Signed)
Preventive Care 38 Years and Older, Female Preventive care refers to lifestyle choices and visits with your health care provider that can promote health and wellness. This includes:  A yearly physical exam. This is also called an annual well check.  Regular dental and eye exams.  Immunizations.  Screening for certain conditions.  Healthy lifestyle choices, such as diet and exercise. What can I expect for my preventive care visit? Physical exam Your health care provider will check:  Height and weight. These may be used to calculate body mass index (BMI), which is a measurement that tells if you are at a healthy weight.  Heart rate and blood pressure.  Your skin for abnormal spots. Counseling Your health care provider may ask you questions about:  Alcohol, tobacco, and drug use.  Emotional well-being.  Home and relationship well-being.  Sexual activity.  Eating habits.  History of falls.  Memory and ability to understand (cognition).  Work and work Statistician.  Pregnancy and menstrual history. What immunizations do I need?  Influenza (flu) vaccine  This is recommended every year. Tetanus, diphtheria, and pertussis (Tdap) vaccine  You may need a Td booster every 10 years. Varicella (chickenpox) vaccine  You may need this vaccine if you have not already been vaccinated. Zoster (shingles) vaccine  You may need this after age 33. Pneumococcal conjugate (PCV13) vaccine  One dose is recommended after age 33. Pneumococcal polysaccharide (PPSV23) vaccine  One dose is recommended after age 72. Measles, mumps, and rubella (MMR) vaccine  You may need at least one dose of MMR if you were born in 1957 or later. You may also need a second dose. Meningococcal conjugate (MenACWY) vaccine  You may need this if you have certain conditions. Hepatitis A vaccine  You may need this if you have certain conditions or if you travel or work in places where you may be exposed  to hepatitis A. Hepatitis B vaccine  You may need this if you have certain conditions or if you travel or work in places where you may be exposed to hepatitis B. Haemophilus influenzae type b (Hib) vaccine  You may need this if you have certain conditions. You may receive vaccines as individual doses or as more than one vaccine together in one shot (combination vaccines). Talk with your health care provider about the risks and benefits of combination vaccines. What tests do I need? Blood tests  Lipid and cholesterol levels. These may be checked every 5 years, or more frequently depending on your overall health.  Hepatitis C test.  Hepatitis B test. Screening  Lung cancer screening. You may have this screening every year starting at age 39 if you have a 30-pack-year history of smoking and currently smoke or have quit within the past 15 years.  Colorectal cancer screening. All adults should have this screening starting at age 36 and continuing until age 15. Your health care provider may recommend screening at age 23 if you are at increased risk. You will have tests every 1-10 years, depending on your results and the type of screening test.  Diabetes screening. This is done by checking your blood sugar (glucose) after you have not eaten for a while (fasting). You may have this done every 1-3 years.  Mammogram. This may be done every 1-2 years. Talk with your health care provider about how often you should have regular mammograms.  BRCA-related cancer screening. This may be done if you have a family history of breast, ovarian, tubal, or peritoneal cancers.  Other tests  Sexually transmitted disease (STD) testing.  Bone density scan. This is done to screen for osteoporosis. You may have this done starting at age 55. Follow these instructions at home: Eating and drinking  Eat a diet that includes fresh fruits and vegetables, whole grains, lean protein, and low-fat dairy products. Limit  your intake of foods with high amounts of sugar, saturated fats, and salt.  Take vitamin and mineral supplements as recommended by your health care provider.  Do not drink alcohol if your health care provider tells you not to drink.  If you drink alcohol: ? Limit how much you have to 0-1 drink a day. ? Be aware of how much alcohol is in your drink. In the [place] one drink equals one 12 oz bottle of beer (355 mL), one 5 oz glass of wine (148 mL), or one 1 oz glass of hard liquor (44 mL). Lifestyle  Take daily care of your teeth and gums.  Stay active. Exercise for at least 30 minutes on 5 or more days each week.  Do not use any products that contain nicotine or tobacco, such as cigarettes, e-cigarettes, and chewing tobacco. If you need help quitting, ask your health care provider.  If you are sexually active, practice safe sex. Use a condom or other form of protection in order to prevent STIs (sexually transmitted infections).  Talk with your health care provider about taking a low-dose aspirin or statin. What's next?  Go to your health care provider once a year for a well check visit.  Ask your health care provider how often you should have your eyes and teeth checked.  Stay up to date on all vaccines. This information is not intended to replace advice given to you by your health care provider. Make sure you discuss any questions you have with your health care provider. Document Released: 08/26/2015 Document Revised: 07/24/2018 Document Reviewed: 07/24/2018 Elsevier Patient Education  2020 [employer].

## 2019-05-05 ENCOUNTER — Other Ambulatory Visit: Payer: Self-pay

## 2019-05-05 DIAGNOSIS — Z20822 Contact with and (suspected) exposure to covid-19: Secondary | ICD-10-CM

## 2019-05-06 LAB — NOVEL CORONAVIRUS, NAA: SARS-CoV-2, NAA: DETECTED — AB

## 2019-05-21 ENCOUNTER — Other Ambulatory Visit: Payer: Self-pay | Admitting: *Deleted

## 2019-05-21 MED ORDER — LEVOTHYROXINE SODIUM 25 MCG PO TABS: ORAL_TABLET | ORAL | 1 refills | Status: DC

## 2019-06-16 ENCOUNTER — Other Ambulatory Visit: Payer: Self-pay

## 2019-06-16 ENCOUNTER — Ambulatory Visit (INDEPENDENT_AMBULATORY_CARE_PROVIDER_SITE_OTHER): Payer: Medicare Other | Admitting: *Deleted

## 2019-06-16 DIAGNOSIS — Z23 Encounter for immunization: Secondary | ICD-10-CM | POA: Diagnosis not present

## 2019-07-09 ENCOUNTER — Other Ambulatory Visit: Payer: Self-pay | Admitting: Family Medicine

## 2019-08-19 ENCOUNTER — Other Ambulatory Visit: Payer: Self-pay

## 2019-08-20 ENCOUNTER — Encounter: Payer: Self-pay | Admitting: Family Medicine

## 2019-08-20 ENCOUNTER — Ambulatory Visit (INDEPENDENT_AMBULATORY_CARE_PROVIDER_SITE_OTHER): Payer: Medicare Other | Admitting: Family Medicine

## 2019-08-20 ENCOUNTER — Other Ambulatory Visit: Payer: Self-pay

## 2019-08-20 VITALS — BP 130/85 | HR 74 | Temp 97.1°F | Ht 62.0 in | Wt 233.0 lb

## 2019-08-20 DIAGNOSIS — I10 Essential (primary) hypertension: Secondary | ICD-10-CM | POA: Diagnosis not present

## 2019-08-20 DIAGNOSIS — K2100 Gastro-esophageal reflux disease with esophagitis, without bleeding: Secondary | ICD-10-CM | POA: Diagnosis not present

## 2019-08-20 DIAGNOSIS — E782 Mixed hyperlipidemia: Secondary | ICD-10-CM | POA: Diagnosis not present

## 2019-08-20 DIAGNOSIS — G4733 Obstructive sleep apnea (adult) (pediatric): Secondary | ICD-10-CM

## 2019-08-20 DIAGNOSIS — E039 Hypothyroidism, unspecified: Secondary | ICD-10-CM | POA: Diagnosis not present

## 2019-08-20 DIAGNOSIS — N3281 Overactive bladder: Secondary | ICD-10-CM

## 2019-08-20 NOTE — Progress Notes (Signed)
Subjective: CC: Establish care with new doctor, hypertension, hyperlipidemia, overactive bladder, chronic pain syndrome PCP: Janora Norlander, DO OHY:[id number] Stacie Russell is a 69 y.o. female presenting to clinic today for:  1. GERD Patient reports ongoing acid reflux despite compliance with Protonix 40 mg daily. She reports some difficulty swallowing and feels like food gets stuck in her throat. She saw her gastroenterologist a couple of years ago who did do an esophageal stretch. This was her second 1 in her lifetime. She was wondering if there is anything medically she could do to help with acid. No unplanned weight loss.  2. Hypertension with hyperlipidemia/chronic pain Patient reports compliance with Hyzaar 100-12.5 mg daily. No chest pain, shortness breath, edema. She has had elevated LDL in the past but has never started medication. She worries that this would aggravate her underlying chronic pain in her legs.  She goes on to state that for some time she had fatigue that no one can figure out a diagnosis for. She was placed on low-dose Synthroid and has been on this for a while now. She did not notice a significant improvement in her symptoms with the Synthroid but was told to continue it. She has a diagnosis of obstructive sleep apnea but per her report was never told that she had sleep apnea nor does she use a CPAP machine.  She ultimately was placed on Cymbalta and this seemed to help quite a bit with both her energy and her chronic leg pain. There is questionable fibromyalgia but she has never been formally diagnosed with this.  3. Overactive bladder Patient reports overactive bladder. She has been on various bladder medicines in the past some of which have worked better than what she is currently on. However, she notes that her insurance will only pay for Ditropan. She reports fair control.    ROS: Per HPI  No Known Allergies Past Medical History:  Diagnosis Date  . Arthritis    . Asthma   . Diverticulosis   . Endometrial hyperplasia   . Esophageal stricture   . Foot pain   . Fundic gland polyps of stomach, benign 07/10/2015  . Gastritis   . Hemorrhoid   . Hiatal hernia   . Hip pain   . Hyperlipidemia   . Hypertension   . Knee pain   . Metabolic syndrome   . Obstructive sleep apnea 08/27/2017  . Oxygen deficiency    pt denies  . Tendonitis of ankle   . Vitamin D deficiency     Current Outpatient Medications:  .  acetaminophen (TYLENOL) 500 MG tablet, Take 500 mg by mouth every 6 (six) hours as needed., Disp: , Rfl:  .  albuterol (PROVENTIL HFA;VENTOLIN HFA) 108 (90 Base) MCG/ACT inhaler, Inhale 2 puffs into the lungs every 6 (six) hours as needed for wheezing or shortness of breath., Disp: , Rfl:  .  aspirin EC 81 MG tablet, Take 81 mg by mouth daily., Disp: , Rfl:  .  DULoxetine (CYMBALTA) 30 MG capsule, TAKE 1 CAPSULE BY MOUTH  DAILY, Disp: 90 capsule, Rfl: 0 .  levothyroxine (SYNTHROID) 25 MCG tablet, TAKE 1 TABLET BY MOUTH  DAILY BEFORE BREAKFAST, Disp: 90 tablet, Rfl: 1 .  losartan-hydrochlorothiazide (HYZAAR) 100-12.5 MG tablet, TAKE 1 TABLET BY MOUTH  DAILY, Disp: 90 tablet, Rfl: 3 .  Omega-3 Fatty Acids (FISH OIL) 1000 MG CAPS, Take 1 capsule by mouth daily. , Disp: , Rfl:  .  oxybutynin (DITROPAN) 5 MG tablet, TAKE 2 TABLETS  BY MOUTH  TWICE DAILY, Disp: 360 tablet, Rfl: 3 .  pantoprazole (PROTONIX) 40 MG tablet, TAKE 1 TABLET BY MOUTH ONCE DAILY BEFORE BREAKFAST, Disp: 90 tablet, Rfl: 3 .  budesonide-formoterol (SYMBICORT) 80-4.5 MCG/ACT inhaler, Inhale 2 puffs into the lungs 2 (two) times daily. (Patient not taking: Reported on 08/20/2019), Disp: 1 Inhaler, Rfl: 0 .  Cholecalciferol (VITAMIN D3) 2000 units TABS, Take 1 tablet by mouth daily., Disp: , Rfl:  .  furosemide (LASIX) 20 MG tablet, TAKE 1 TABLET BY MOUTH  DAILY AS NEEDED (Patient not taking: Reported on 08/20/2019), Disp: 90 tablet, Rfl: 1 Social History   Socioeconomic History  .  Marital status: Married    Spouse name: Marcello Moores  . Number of children: 2  . Years of education: 14  . Highest education level: Associate degree: academic program  Occupational History  . Occupation: Retired  Tobacco Use  . Smoking status: Never Smoker  . Smokeless tobacco: Never Used  Substance and Sexual Activity  . Alcohol use: No  . Drug use: No  . Sexual activity: Not Currently    Birth control/protection: Post-menopausal  Other Topics Concern  . Not on file  Social History Narrative   Married and retired   No EtOH, tobacco or drug use   Social Determinants of Health   Financial Resource Strain: Low Risk   . Difficulty of Paying Living Expenses: Not hard at all  Food Insecurity: No Food Insecurity  . Worried About Charity fundraiser in the Last Year: Never true  . Ran Out of Food in the Last Year: Never true  Transportation Needs: No Transportation Needs  . Lack of Transportation (Medical): No  . Lack of Transportation (Non-Medical): No  Physical Activity: Inactive  . Days of Exercise per Week: 0 days  . Minutes of Exercise per Session: 0 min  Stress: No Stress Concern Present  . Feeling of Stress : Not at all  Social Connections: Not Isolated  . Frequency of Communication with Friends and Family: More than three times a week  . Frequency of Social Gatherings with Friends and Family: More than three times a week  . Attends Religious Services: More than 4 times per year  . Active Member of Clubs or Organizations: Yes  . Attends Archivist Meetings: More than 4 times per year  . Marital Status: Married  Human resources officer Violence: Not At Risk  . Fear of Current or Ex-Partner: No  . Emotionally Abused: No  . Physically Abused: No  . Sexually Abused: No   Family History  Problem Relation Age of Onset  . Cancer Mother        Liver, stomach, colon  . Colon cancer Mother   . Heart attack Father   . Heart disease Sister   . Breast cancer Sister   . Breast  cancer Sister   . Esophageal cancer Neg Hx   . Stomach cancer Neg Hx   . Rectal cancer Neg Hx     Objective: Office vital signs reviewed. BP 130/85   Pulse 74   Temp (!) 97.1 F (36.2 C) (Temporal)   Ht 5' 2"  (1.575 m)   Wt 233 lb (105.7 kg)   SpO2 96%   BMI 42.62 kg/m   Physical Examination:  General: Awake, alert, well nourished, No acute distress HEENT: Normal; no exophthalmos.  No goiter. Cardio: regular rate and rhythm, S1S2 heard, no murmurs appreciated Pulm: clear to auscultation bilaterally, no wheezes, rhonchi or rales; normal work  of breathing on room air Extremities: warm, well perfused, No edema, cyanosis or clubbing; +2 pulses bilaterally MSK: Normal gait and station Psych: Mood stable, speech normal, affect appropriate, pleasant and interactive  Assessment/ Plan: 69 y.o. female   1. Hypothyroidism, unspecified type Possibly subclinical.  I reviewed her laboratory results and it appears in 2019 she had a TSH of 4.7.  She was placed on low-dose Synthroid at that time.  She has never been trialed off of this.  May consider trial off that she did not find that especially changed anything.  We discussed today that TSH could rise and fall in the setting of acute illness.  Check thyroid panel.  If she has a fairly normal thyroid panel could consider trial off of Synthroid.  We will discuss this pending lab - CMP14+EGFR - Thyroid Panel With TSH  2. Essential hypertension, benign Controlled.  Continue current regimen - CMP14+EGFR  3. Mixed hyperlipidemia LDL noted to be elevated over the years.  She has never taken statins due to chronic muscle pain.  She is fasting today - CMP14+EGFR - LDL Cholesterol, Direct  4. Severe obstructive sleep apnea This is documented in her record but she is not on any sleep machines.  After record review it appears that she had a sleep study done in February 2019 which showed severe obstructive sleep apnea.  It was recommended at that  time that she return for ASV titration but patient declined.  I discussed this with the patient after she left the office and she at this time does not want to pursue any further testing.  We discussed the implications of untreated sleep apnea on blood pressure, heart and lungs.  She also understand that she is at increased risk of cardiovascular events if this is left untreated.  She will let me know if she changes her mind on this issue.  5. Gastroesophageal reflux disease with esophagitis without hemorrhage Polyps noted on previous EGDs.  She has required previous esophageal stretching.  I have instructed her to increase her Protonix to twice daily for 1 week.  If this improves may need to continue this regimen.  If symptoms do not improve okay to go back to Protonix daily and add Pepcid 20 mg instead.  I would encourage her to follow-up with her gastroenterologist as she continues to have globus sensation and uncontrolled reflux.  6. Overactive bladder Stable  Orders Placed This Encounter  Procedures  . CMP14+EGFR  . Thyroid Panel With TSH  . LDL Cholesterol, Direct   No orders of the defined types were placed in this encounter.    Janora Norlander, DO [place] [phone]

## 2019-08-20 NOTE — Patient Instructions (Addendum)
I'll check into those sleep study results for you.  Tell your husband to go back up on the sleep aid if he is having trouble sleeping.  I'll write a new prescription at our next visit.  He can alternate with the Cymbalta doses like we talked about.  We will plan to wean down the other medication when we see eachother.  You can take Pantoprazole in the morning and evening for 1 week.  If this helps, continue this regimen. If it doesn't, go back to once daily pantoprazole and add Pepcid 20mg  to your regimen.  If still no improvement, follow up with Dr Carlean Purl

## 2019-08-21 ENCOUNTER — Other Ambulatory Visit: Payer: Self-pay | Admitting: Family Medicine

## 2019-08-21 DIAGNOSIS — E782 Mixed hyperlipidemia: Secondary | ICD-10-CM

## 2019-08-21 DIAGNOSIS — E039 Hypothyroidism, unspecified: Secondary | ICD-10-CM

## 2019-08-21 LAB — CMP14+EGFR
ALT: 17 IU/L (ref 0–32)
AST: 15 IU/L (ref 0–40)
Albumin/Globulin Ratio: 1.5 (ref 1.2–2.2)
Albumin: 4.1 g/dL (ref 3.8–4.8)
Alkaline Phosphatase: 110 IU/L (ref 39–117)
BUN/Creatinine Ratio: 21 (ref 12–28)
BUN: 16 mg/dL (ref 8–27)
Bilirubin Total: 0.4 mg/dL (ref 0.0–1.2)
CO2: 24 mmol/L (ref 20–29)
Calcium: 9.7 mg/dL (ref 8.7–10.3)
Chloride: 100 mmol/L (ref 96–106)
Creatinine, Ser: 0.75 mg/dL (ref 0.57–1.00)
GFR calc Af Amer: 93 mL/min/{1.73_m2} (ref >59)
GFR calc non Af Amer: 81 mL/min/{1.73_m2} (ref >59)
Globulin, Total: 2.7 g/dL (ref 1.5–4.5)
Glucose: 90 mg/dL (ref 65–99)
Potassium: 4.7 mmol/L (ref 3.5–5.2)
Sodium: 138 mmol/L (ref 134–144)
Total Protein: 6.8 g/dL (ref 6.0–8.5)

## 2019-08-21 LAB — THYROID PANEL WITH TSH
Free Thyroxine Index: 1.2 (ref 1.2–4.9)
T3 Uptake Ratio: 23 % — ABNORMAL LOW (ref 24–39)
T4, Total: 5.4 ug/dL (ref 4.5–12.0)
TSH: 5.17 u[IU]/mL — ABNORMAL HIGH (ref 0.450–4.500)

## 2019-08-21 LAB — LDL CHOLESTEROL, DIRECT: LDL Direct: 143 mg/dL — ABNORMAL HIGH (ref 0–99)

## 2019-08-21 MED ORDER — LEVOTHYROXINE SODIUM 50 MCG PO TABS: 50.0000 ug | ORAL_TABLET | Freq: Every day | ORAL | 0 refills | Status: DC

## 2019-09-08 ENCOUNTER — Other Ambulatory Visit: Payer: Self-pay | Admitting: Physician Assistant

## 2019-09-24 ENCOUNTER — Ambulatory Visit (INDEPENDENT_AMBULATORY_CARE_PROVIDER_SITE_OTHER): Payer: Medicare Other | Admitting: Orthopaedic Surgery

## 2019-09-24 ENCOUNTER — Encounter: Payer: Self-pay | Admitting: Orthopaedic Surgery

## 2019-09-24 ENCOUNTER — Ambulatory Visit: Payer: Self-pay

## 2019-09-24 VITALS — BP 157/103 | HR 77 | Ht 62.0 in | Wt 233.0 lb

## 2019-09-24 DIAGNOSIS — M65311 Trigger thumb, right thumb: Secondary | ICD-10-CM | POA: Diagnosis not present

## 2019-09-24 DIAGNOSIS — M79672 Pain in left foot: Secondary | ICD-10-CM | POA: Diagnosis not present

## 2019-09-24 DIAGNOSIS — M659 Synovitis and tenosynovitis, unspecified: Secondary | ICD-10-CM | POA: Diagnosis not present

## 2019-09-24 DIAGNOSIS — M65972 Unspecified synovitis and tenosynovitis, left ankle and foot: Secondary | ICD-10-CM | POA: Insufficient documentation

## 2019-09-24 NOTE — Progress Notes (Signed)
Office Visit Note   Patient: Stacie Russell           Date of Birth: September 13, 1950           MRN: [id number] Visit Date: 09/24/2019              Requested by: Janora Norlander, DO [place],  [place] [id number] PCP: Janora Norlander, DO   Assessment & Plan: Visit Diagnoses:  1. Pain in left foot   2. Synovitis of left ankle   3. Trigger thumb, right thumb     Plan: 2 calluses trimmed over left plantar foot second third metatarsal heads which were thickened and painful with palpation.  Right  trigger thumb injection performed.  Would dorsal IP splint applied she can use off and on.  Left ankle Swede-O for her ankle synovitis.  She will return if she is having persistent problems.  Follow-Up Instructions: No follow-ups on file.   Orders:  Orders Placed This Encounter  Procedures  . XR Foot Complete Left   No orders of the defined types were placed in this encounter.     Procedures: Hand/UE Inj: R thumb A1 for trigger finger on 09/24/2019 12:06 PM      Clinical Data: No additional findings.   Subjective: Chief Complaint  Patient presents with  . Right Hand - Pain  . Left Foot - Pain    HPI 69 year old female returns with several problems.  She does have problems with her left ankle since she went on a trip last year and climbed in and out of a van.  She recalls many years ago she injured her ankle.  She is noted swelling anterior lateral which increases with activity and sometimes it feels like it is going to give way.  Her other problem is left plantar foot painful callus over the second third metatarsal she is to have these trimmed by podiatrist who retired.  She is tried scraping them some herself but they have gotten thicker larger and more painful.  I previously saw her for right wrist first dorsal compartment stenosing tenosynovitis which is resolved and now she is having triggering of her right thumb that wakes her up in the morning with it locked  and she has to manually reduce it.  Pain with gripping and squeezing.  Review of Systems 14 point update unchanged from 10/16/2018 office visit.   Objective: Vital Signs: BP (!) 157/103   Pulse 77   Ht 5\' 2"  (1.575 m)   Wt 233 lb (105.7 kg)   BMI 42.62 kg/m   Physical Exam Constitutional:      Appearance: She is well-developed.  HENT:     Head: Normocephalic.     Right Ear: External ear normal.     Left Ear: External ear normal.  Eyes:     Pupils: Pupils are equal, round, and reactive to light.  Neck:     Thyroid: No thyromegaly.     Trachea: No tracheal deviation.  Cardiovascular:     Rate and Rhythm: Normal rate.  Pulmonary:     Effort: Pulmonary effort is normal.  Abdominal:     Palpations: Abdomen is soft.  Skin:    General: Skin is warm and dry.  Neurological:     Mental Status: She is alert and oriented to person, place, and time.  Psychiatric:        Behavior: Behavior normal.     Ortho Exam pain with thick calluses over  the second third metatarsal heads left foot none on the right.  Left ankle synovitis with tenderness anterolaterally 1+ anterior drawer symmetrical.  Peroneal tendons posterior tib are normal with palpation good range of motion subtalar motions normal no plantar foot tenderness other than over the calluses over the metatarsal heads.  Left thumb A1 pulley tenderness she demonstrates actively being able to trigger the thumb with tenderness palpable thickening of the tendon sheath.  First dorsal compartment is no longer tender negative Finkelstein test.  Specialty Comments:  No specialty comments available.  Imaging: No results found.   PMFS History: Patient Active Problem List   Diagnosis Date Noted  . Synovitis of left ankle 09/24/2019  . Trigger thumb, right thumb 09/24/2019  . OA (osteoarthritis) of knee 10/07/2017  . Severe obstructive sleep apnea 08/27/2017  . Oxygen deficiency   . Hypertension   . Spinal stenosis of lumbar region  03/08/2017  . Fatigue 02/15/2017  . Palpitations 01/16/2017  . Fundic gland polyps of stomach, benign 07/10/2015  . Family history of colon cancer in mother - dx 34's 07/04/2015  . Overactive bladder 12/29/2014  . Chest pain 10/22/2013  . Metabolic syndrome [id number]  . Morbid obesity due to excess calories ([place]) 05/07/2013  . Essential hypertension, benign 11/03/2012  . Vitamin D deficiency 11/03/2012  . Knee pain   . Endometrial hyperplasia   . Hemorrhoid   . Foot pain   . Esophageal stricture   . Diverticulosis   . Hip pain   . Gastritis   . Tendonitis of ankle   . Hiatal hernia   . Mixed hyperlipidemia   . Asthma   . GERD 10/03/2009   Past Medical History:  Diagnosis Date  . Arthritis   . Asthma   . Diverticulosis   . Endometrial hyperplasia   . Esophageal stricture   . Foot pain   . Fundic gland polyps of stomach, benign 07/10/2015  . Gastritis   . Hemorrhoid   . Hiatal hernia   . Hip pain   . Hyperlipidemia   . Hypertension   . Knee pain   . Metabolic syndrome   . Obstructive sleep apnea 08/27/2017  . Oxygen deficiency    pt denies  . Tendonitis of ankle   . Vitamin D deficiency     Family History  Problem Relation Age of Onset  . Cancer Mother        Liver, stomach, colon  . Colon cancer Mother   . Heart attack Father   . Heart disease Sister   . Breast cancer Sister   . Breast cancer Sister   . Esophageal cancer Neg Hx   . Stomach cancer Neg Hx   . Rectal cancer Neg Hx     Past Surgical History:  Procedure Laterality Date  . ANAL FISSURE REPAIR    . CATARACT EXTRACTION  May 2009  . COLONOSCOPY  12/26/07  . ESOPHAGOGASTRODUODENOSCOPY  12/26/07, 05/13/08  . EYE SURGERY     bilateral cataract with lens implants  . KNEE CARTILAGE SURGERY Right Sept 1999  . TOTAL KNEE ARTHROPLASTY Right 10/07/2017   Procedure: RIGHT TOTAL KNEE ARTHROPLASTY;  Surgeon: Gaynelle Arabian, MD;  Location: [facility];  Service: Orthopedics;  Laterality: Right;  Adductor Block   . UMBILICAL HERNIA REPAIR  05/20/06   Social History   Occupational History  . Occupation: Retired  Tobacco Use  . Smoking status: Never Smoker  . Smokeless tobacco: Never Used  Substance and Sexual Activity  . Alcohol use:  No  . Drug use: No  . Sexual activity: Not Currently    Birth control/protection: Post-menopausal

## 2019-11-09 ENCOUNTER — Other Ambulatory Visit: Payer: Self-pay | Admitting: Family Medicine

## 2019-11-11 ENCOUNTER — Telehealth: Payer: Self-pay | Admitting: Family Medicine

## 2019-11-11 NOTE — Chronic Care Management (AMB) (Signed)
  Chronic Care Management   Note  11/11/2019 Name: Stacie Russell MRN: [id number] DOB: 1951/04/09  Stacie Russell is a 69 y.o. year old female who is a primary care patient of Janora Norlander, DO. I reached out to [name] by phone today in response to a referral sent by Stacie Russell health plan.     Stacie Russell was given information about Chronic Care Management services today including:  1. CCM service includes personalized support from designated clinical staff supervised by her physician, including individualized plan of care and coordination with other care providers 2. 24/7 contact phone numbers for assistance for urgent and routine care needs. 3. Service will only be billed when office clinical staff spend 20 minutes or more in a month to coordinate care. 4. Only one practitioner may furnish and bill the service in a calendar month. 5. The patient may stop CCM services at any time (effective at the end of the month) by phone call to the office staff. 6. The patient will be responsible for cost sharing (co-pay) of up to 20% of the service fee (after annual deductible is met).  Patient agreed to services and verbal consent obtained.   Follow up plan: Telephone appointment with care management team member scheduled for: 07/01/2020.  [place], [place] [address] Direct Dial: [phone] [email or web address]: [email or web address]

## 2019-11-19 ENCOUNTER — Other Ambulatory Visit: Payer: Self-pay | Admitting: *Deleted

## 2019-11-19 MED ORDER — PANTOPRAZOLE SODIUM 40 MG PO TBEC: DELAYED_RELEASE_TABLET | ORAL | 0 refills | Status: DC

## 2019-11-19 MED ORDER — LEVOTHYROXINE SODIUM 50 MCG PO TABS: 50.0000 ug | ORAL_TABLET | Freq: Every day | ORAL | 0 refills | Status: DC

## 2019-12-22 ENCOUNTER — Other Ambulatory Visit: Payer: Self-pay | Admitting: Family Medicine

## 2019-12-31 ENCOUNTER — Emergency Department (HOSPITAL_COMMUNITY): Payer: Medicare Other

## 2019-12-31 ENCOUNTER — Encounter (HOSPITAL_COMMUNITY): Payer: Self-pay | Admitting: Emergency Medicine

## 2019-12-31 ENCOUNTER — Emergency Department (HOSPITAL_COMMUNITY)
Admission: EM | Admit: 2019-12-31 | Discharge: 2019-12-31 | Disposition: A | Discharge status: Home / Self Care | Payer: Medicare Other | Attending: Emergency Medicine | Admitting: Emergency Medicine

## 2019-12-31 ENCOUNTER — Other Ambulatory Visit: Payer: Self-pay

## 2019-12-31 DIAGNOSIS — J45909 Unspecified asthma, uncomplicated: Secondary | ICD-10-CM | POA: Insufficient documentation

## 2019-12-31 DIAGNOSIS — Y939 Activity, unspecified: Secondary | ICD-10-CM | POA: Diagnosis not present

## 2019-12-31 DIAGNOSIS — Z7982 Long term (current) use of aspirin: Secondary | ICD-10-CM | POA: Insufficient documentation

## 2019-12-31 DIAGNOSIS — I10 Essential (primary) hypertension: Secondary | ICD-10-CM | POA: Diagnosis not present

## 2019-12-31 DIAGNOSIS — W1789XA Other fall from one level to another, initial encounter: Secondary | ICD-10-CM | POA: Diagnosis not present

## 2019-12-31 DIAGNOSIS — Z79899 Other long term (current) drug therapy: Secondary | ICD-10-CM | POA: Diagnosis not present

## 2019-12-31 DIAGNOSIS — Y999 Unspecified external cause status: Secondary | ICD-10-CM | POA: Diagnosis not present

## 2019-12-31 DIAGNOSIS — W01190A Fall on same level from slipping, tripping and stumbling with subsequent striking against furniture, initial encounter: Secondary | ICD-10-CM | POA: Diagnosis not present

## 2019-12-31 DIAGNOSIS — M79602 Pain in left arm: Secondary | ICD-10-CM | POA: Diagnosis not present

## 2019-12-31 DIAGNOSIS — M25512 Pain in left shoulder: Secondary | ICD-10-CM | POA: Diagnosis not present

## 2019-12-31 DIAGNOSIS — Y929 Unspecified place or not applicable: Secondary | ICD-10-CM | POA: Insufficient documentation

## 2019-12-31 DIAGNOSIS — S4992XA Unspecified injury of left shoulder and upper arm, initial encounter: Secondary | ICD-10-CM | POA: Diagnosis present

## 2019-12-31 DIAGNOSIS — S42342A Displaced spiral fracture of shaft of humerus, left arm, initial encounter for closed fracture: Secondary | ICD-10-CM | POA: Diagnosis not present

## 2019-12-31 DIAGNOSIS — S42352A Displaced comminuted fracture of shaft of humerus, left arm, initial encounter for closed fracture: Secondary | ICD-10-CM | POA: Diagnosis not present

## 2019-12-31 DIAGNOSIS — Z96651 Presence of right artificial knee joint: Secondary | ICD-10-CM | POA: Insufficient documentation

## 2019-12-31 DIAGNOSIS — S42302A Unspecified fracture of shaft of humerus, left arm, initial encounter for closed fracture: Secondary | ICD-10-CM | POA: Insufficient documentation

## 2019-12-31 MED ORDER — HYDROMORPHONE HCL 1 MG/ML IJ SOLN
1.0000 mg | Freq: Once | INTRAMUSCULAR | Status: AC
  Administered 2019-12-31: 1 mg via INTRAVENOUS
  Filled 2019-12-31: qty 1

## 2019-12-31 MED ORDER — OXYCODONE-ACETAMINOPHEN 5-325 MG PO TABS: 1.0000 | ORAL_TABLET | ORAL | 0 refills | Status: DC | PRN

## 2019-12-31 MED ORDER — MORPHINE SULFATE (PF) 4 MG/ML IV SOLN
4.0000 mg | Freq: Once | INTRAVENOUS | Status: AC
  Administered 2019-12-31: 4 mg via INTRAVENOUS
  Filled 2019-12-31: qty 1

## 2019-12-31 NOTE — ED Provider Notes (Signed)
[facility] DEPT Provider Note   CSN: [id number] Arrival date & time: 12/31/19  1753     History Chief Complaint  Patient presents with  . Arm Injury  . Fall    Stacie Russell is a 69 y.o. female.  Pt presents to the ED today with left arm pain.  She fell from standing and her left arm went through a barstool.  It twisted.  No other injury.  EMS did give her 200 mcg fentanyl and 4 mg zofran en route.  Pt denies any other injuries.  She did not hit her head or have a loc.        Past Medical History:  Diagnosis Date  . Arthritis   . Asthma   . Diverticulosis   . Endometrial hyperplasia   . Esophageal stricture   . Foot pain   . Fundic gland polyps of stomach, benign 07/10/2015  . Gastritis   . Hemorrhoid   . Hiatal hernia   . Hip pain   . Hyperlipidemia   . Hypertension   . Knee pain   . Metabolic syndrome   . Obstructive sleep apnea 08/27/2017  . Oxygen deficiency    pt denies  . Tendonitis of ankle   . Vitamin D deficiency     Patient Active Problem List   Diagnosis Date Noted  . Synovitis of left ankle 09/24/2019  . Trigger thumb, right thumb 09/24/2019  . OA (osteoarthritis) of knee 10/07/2017  . Severe obstructive sleep apnea 08/27/2017  . Oxygen deficiency   . Hypertension   . Spinal stenosis of lumbar region 03/08/2017  . Fatigue 02/15/2017  . Palpitations 01/16/2017  . Fundic gland polyps of stomach, benign 07/10/2015  . Family history of colon cancer in mother - dx 86's 07/04/2015  . Overactive bladder 12/29/2014  . Chest pain 10/22/2013  . Metabolic syndrome [id number]  . Morbid obesity due to excess calories ([place]) 05/07/2013  . Essential hypertension, benign 11/03/2012  . Vitamin D deficiency 11/03/2012  . Knee pain   . Endometrial hyperplasia   . Hemorrhoid   . Foot pain   . Esophageal stricture   . Diverticulosis   . Hip pain   . Gastritis   . Tendonitis of ankle   . Hiatal hernia   . Mixed  hyperlipidemia   . Asthma   . GERD 10/03/2009    Past Surgical History:  Procedure Laterality Date  . ANAL FISSURE REPAIR    . CATARACT EXTRACTION  May 2009  . COLONOSCOPY  12/26/07  . ESOPHAGOGASTRODUODENOSCOPY  12/26/07, 05/13/08  . EYE SURGERY     bilateral cataract with lens implants  . KNEE CARTILAGE SURGERY Right Sept 1999  . 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 UMBILICAL HERNIA REPAIR  05/20/06     OB History   No obstetric history on file.     Family History  Problem Relation Age of Onset  . Cancer Mother        Liver, stomach, colon  . Colon cancer Mother   . Heart attack Father   . Heart disease Sister   . Breast cancer Sister   . Breast cancer Sister   . Esophageal cancer Neg Hx   . Stomach cancer Neg Hx   . Rectal cancer Neg Hx     Social History   Tobacco  Use  . Smoking status: Never Smoker  . Smokeless tobacco: Never Used  Substance Use Topics  . Alcohol use: No  . Drug use: No    Home Medications Prior to Admission medications   Medication Sig Start Date End Date Taking? Authorizing Provider  acetaminophen (TYLENOL) 500 MG tablet Take 500 mg by mouth every 6 (six) hours as needed for moderate pain.    Yes [provider]  albuterol (PROVENTIL HFA;VENTOLIN HFA) 108 (90 Base) MCG/ACT inhaler Inhale 2 puffs into the lungs every 6 (six) hours as needed for wheezing or shortness of breath.   Yes [provider]  aspirin EC 81 MG tablet Take 81 mg by mouth daily.   Yes [provider]  budesonide-formoterol (SYMBICORT) 80-4.5 MCG/ACT inhaler Inhale 2 puffs into the lungs 2 (two) times daily. Patient taking differently: Inhale 2 puffs into the lungs 2 (two) times daily as needed (sob/wheezing).  10/02/17  Yes Chipper Herb, MD  Cholecalciferol (VITAMIN D3) 2000 units TABS  Take 1 tablet by mouth daily.   Yes [provider]  DULoxetine (CYMBALTA) 30 MG capsule TAKE 1 CAPSULE BY MOUTH  DAILY 11/09/19  Yes Gottschalk, Ashly M, DO  furosemide (LASIX) 20 MG tablet TAKE 1 TABLET BY MOUTH  DAILY AS NEEDED Patient taking differently: Take 20 mg by mouth daily as needed for fluid.  12/24/17  Yes Chipper Herb, MD  levothyroxine (SYNTHROID) 50 MCG tablet Take 1 tablet (50 mcg total) by mouth daily before breakfast. 11/19/19  Yes Gottschalk, Ashly M, DO  losartan-hydrochlorothiazide (HYZAAR) 100-12.5 MG tablet TAKE 1 TABLET BY MOUTH  DAILY 07/14/19  Yes Gottschalk, Leatrice Jewels M, DO  Omega-3 Fatty Acids (FISH OIL) 1000 MG CAPS Take 1 capsule by mouth every evening.    Yes [provider]  oxybutynin (DITROPAN) 5 MG tablet TAKE 2 TABLETS BY MOUTH  TWICE DAILY 04/07/19  Yes Dettinger, Fransisca Kaufmann, MD  pantoprazole (PROTONIX) 40 MG tablet TAKE 1 TABLET BY MOUTH ONCE DAILY BEFORE BREAKFAST 11/19/19  Yes Gottschalk, Ashly M, DO  oxyCODONE-acetaminophen (PERCOCET/ROXICET) 5-325 MG tablet Take 1 tablet by mouth every 4 (four) hours as needed for severe pain. 12/31/19   Isla Pence, MD    Allergies    Patient has no known allergies.  Review of Systems   Review of Systems  Musculoskeletal:       Left upper arm pain  All other systems reviewed and are negative.   Physical Exam Updated Vital Signs BP (!) 153/96   Pulse 78   Temp (!) 97.5 F (36.4 C) (Oral)   Resp 12   Ht 5\' 2"  (1.575 m)   Wt 108.9 kg   SpO2 95%   BMI 43.90 kg/m   Physical Exam Vitals and nursing note reviewed.  Constitutional:      Appearance: Normal appearance.  HENT:     Head: Normocephalic and atraumatic.     Right Ear: External ear normal.     Left Ear: External ear normal.     Nose: Nose normal.     Mouth/Throat:     Mouth: Mucous membranes are moist.     Pharynx: Oropharynx is clear.  Eyes:     Extraocular Movements: Extraocular movements intact.     Conjunctiva/sclera:  Conjunctivae normal.     Pupils: Pupils are equal, round, and reactive to light.  Cardiovascular:     Rate and Rhythm: Normal rate and regular rhythm.     Pulses: Normal pulses.  Heart sounds: Normal heart sounds.  Pulmonary:     Effort: Pulmonary effort is normal.     Breath sounds: Normal breath sounds.  Abdominal:     General: Abdomen is flat. Bowel sounds are normal.     Palpations: Abdomen is soft.  Musculoskeletal:       Arms:     Cervical back: Normal range of motion and neck supple.  Skin:    General: Skin is warm.     Capillary Refill: Capillary refill takes less than 2 seconds.  Neurological:     General: No focal deficit present.     Mental Status: She is alert and oriented to person, place, and time.  Psychiatric:        Mood and Affect: Mood normal.        Behavior: Behavior normal.     ED Results / Procedures / Treatments   Labs (all labs ordered are listed, but only abnormal results are displayed) Labs Reviewed - No data to display  EKG None  Radiology DG Humerus Left  Result Date: 12/31/2019 CLINICAL DATA:  69 year old female status post fall at home with pain. EXAM: LEFT HUMERUS - 2+ VIEW COMPARISON:  Chest radiograph 01/14/2017. FINDINGS: Comminuted spiral fracture of the midshaft left humerus. 1 full shaft width lateral displacement of the fracture fragment with estimated 1-2 cm of over riding. The proximal and distal humerus metadiaphysis appear to remain intact. Glenohumeral joint alignment appears grossly normal. No other No acute osseous abnormality identified. IMPRESSION: Elongated spiral fracture of the left humerus midshaft with comminution, 1 full shaft width lateral displacement, and over riding of fragments by 1-2 cm. Electronically Signed   By: Genevie Ann M.D.   On: 12/31/2019 18:49    Procedures Procedures (including critical care time)  Medications Ordered in ED Medications  HYDROmorphone (DILAUDID) injection 1 mg (has no administration  in time range)  morphine 4 MG/ML injection 4 mg (4 mg Intravenous Given 12/31/19 1822)  HYDROmorphone (DILAUDID) injection 1 mg (1 mg Intravenous Given 12/31/19 1907)  HYDROmorphone (DILAUDID) injection 1 mg (1 mg Intravenous Given 12/31/19 1957)    ED Course  I have reviewed the triage vital signs and the nursing notes.  Pertinent labs & imaging results that were available during my care of the patient were reviewed by me and considered in my medical decision making (see chart for details).    MDM Rules/Calculators/A&P                      Pt d/w Dr. Doran Durand ([facility]) who recommends coaptation splint and calling the office tomorrow for an appointment.    Pt instructed to return if worse. Final Clinical Impression(s) / ED Diagnoses Final diagnoses:  Closed fracture of shaft of left humerus, unspecified fracture morphology, initial encounter    Rx / DC Orders ED Discharge Orders         Ordered    oxyCODONE-acetaminophen (PERCOCET/ROXICET) 5-325 MG tablet  Every 4 hours PRN     12/31/19 2026           Isla Pence, MD 12/31/19 2028

## 2019-12-31 NOTE — ED Triage Notes (Signed)
Arrives via EMS from home, C/C fall from standing, L arm went through barstool railing. Pain and crepitus on L upper arm area.  200 mcg Fentanyl, 4 mg Zofran for nausea, 20 G R Hand.  BP 146/80 HR 78 O2 94 RA

## 2020-01-01 ENCOUNTER — Telehealth: Payer: Self-pay | Admitting: Orthopaedic Surgery

## 2020-01-01 NOTE — Telephone Encounter (Signed)
Dr. Lorin Mercy called and spoke with patient.

## 2020-01-01 NOTE — Telephone Encounter (Signed)
Patient has broken humerus, would like to discuss pain control until she can be seen. Images pulled and at your desk.

## 2020-01-01 NOTE — Telephone Encounter (Signed)
Patient called and and requesting a call back from [facility]. Please call patient at [phone].

## 2020-01-01 NOTE — Telephone Encounter (Signed)
Patient would like for you to call her.  She did not specify a reason for the call.  CB#[phone].  Thank you.

## 2020-01-04 DIAGNOSIS — S42342A Displaced spiral fracture of shaft of humerus, left arm, initial encounter for closed fracture: Secondary | ICD-10-CM | POA: Diagnosis not present

## 2020-01-04 DIAGNOSIS — M79602 Pain in left arm: Secondary | ICD-10-CM | POA: Insufficient documentation

## 2020-01-07 ENCOUNTER — Encounter (HOSPITAL_COMMUNITY): Payer: Self-pay | Admitting: Orthopedic Surgery

## 2020-01-07 ENCOUNTER — Other Ambulatory Visit: Payer: Self-pay

## 2020-01-07 ENCOUNTER — Other Ambulatory Visit: Payer: Self-pay | Admitting: Family Medicine

## 2020-01-07 NOTE — Progress Notes (Addendum)
Mrs Stacie Russell denies chest pain or shortness of breath. Patient denies any s/s of Covid for her or her husband. Mrs Stacie Russell said that it will be impossible to quarantine between Covid test Friday and Tues am.  "My children come in the house to help me. " I told patient that she may come to the hospital 3 hours prior to surgery and get tested.

## 2020-01-08 ENCOUNTER — Inpatient Hospital Stay (HOSPITAL_COMMUNITY): Admission: RE | Admit: 2020-01-08 | Payer: Medicare Other | Source: Ambulatory Visit

## 2020-01-12 ENCOUNTER — Encounter (HOSPITAL_COMMUNITY): Payer: Self-pay | Admitting: Orthopedic Surgery

## 2020-01-12 ENCOUNTER — Encounter (HOSPITAL_COMMUNITY)
Admission: AD | Disposition: A | Discharge status: Home Health | Payer: Self-pay | Source: Home / Self Care | Attending: Orthopedic Surgery

## 2020-01-12 ENCOUNTER — Ambulatory Visit (HOSPITAL_COMMUNITY): Payer: Medicare Other | Admitting: Anesthesiology

## 2020-01-12 ENCOUNTER — Other Ambulatory Visit: Payer: Self-pay

## 2020-01-12 ENCOUNTER — Ambulatory Visit (HOSPITAL_COMMUNITY): Payer: Medicare Other

## 2020-01-12 ENCOUNTER — Inpatient Hospital Stay (HOSPITAL_COMMUNITY)
Admission: AD | Admit: 2020-01-12 | Discharge: 2020-01-15 | DRG: 493 | Disposition: A | Discharge status: Home Health | Payer: Medicare Other | Attending: Orthopedic Surgery | Admitting: Orthopedic Surgery

## 2020-01-12 DIAGNOSIS — K219 Gastro-esophageal reflux disease without esophagitis: Secondary | ICD-10-CM | POA: Diagnosis not present

## 2020-01-12 DIAGNOSIS — Z09 Encounter for follow-up examination after completed treatment for conditions other than malignant neoplasm: Secondary | ICD-10-CM

## 2020-01-12 DIAGNOSIS — Z96651 Presence of right artificial knee joint: Secondary | ICD-10-CM | POA: Diagnosis not present

## 2020-01-12 DIAGNOSIS — G4733 Obstructive sleep apnea (adult) (pediatric): Secondary | ICD-10-CM | POA: Diagnosis present

## 2020-01-12 DIAGNOSIS — I10 Essential (primary) hypertension: Secondary | ICD-10-CM | POA: Diagnosis present

## 2020-01-12 DIAGNOSIS — E785 Hyperlipidemia, unspecified: Secondary | ICD-10-CM | POA: Diagnosis not present

## 2020-01-12 DIAGNOSIS — S42352A Displaced comminuted fracture of shaft of humerus, left arm, initial encounter for closed fracture: Principal | ICD-10-CM | POA: Diagnosis present

## 2020-01-12 DIAGNOSIS — Z79890 Hormone replacement therapy (postmenopausal): Secondary | ICD-10-CM

## 2020-01-12 DIAGNOSIS — E782 Mixed hyperlipidemia: Secondary | ICD-10-CM | POA: Diagnosis not present

## 2020-01-12 DIAGNOSIS — Z7982 Long term (current) use of aspirin: Secondary | ICD-10-CM

## 2020-01-12 DIAGNOSIS — W19XXXA Unspecified fall, initial encounter: Secondary | ICD-10-CM | POA: Diagnosis present

## 2020-01-12 DIAGNOSIS — Z79899 Other long term (current) drug therapy: Secondary | ICD-10-CM | POA: Diagnosis not present

## 2020-01-12 DIAGNOSIS — J45909 Unspecified asthma, uncomplicated: Secondary | ICD-10-CM | POA: Diagnosis not present

## 2020-01-12 DIAGNOSIS — M659 Synovitis and tenosynovitis, unspecified: Secondary | ICD-10-CM

## 2020-01-12 DIAGNOSIS — E039 Hypothyroidism, unspecified: Secondary | ICD-10-CM | POA: Diagnosis present

## 2020-01-12 DIAGNOSIS — M65311 Trigger thumb, right thumb: Secondary | ICD-10-CM

## 2020-01-12 DIAGNOSIS — Z20822 Contact with and (suspected) exposure to covid-19: Secondary | ICD-10-CM | POA: Diagnosis present

## 2020-01-12 DIAGNOSIS — Z6841 Body Mass Index (BMI) 40.0 and over, adult: Secondary | ICD-10-CM | POA: Diagnosis not present

## 2020-01-12 DIAGNOSIS — G8918 Other acute postprocedural pain: Secondary | ICD-10-CM | POA: Diagnosis not present

## 2020-01-12 DIAGNOSIS — S42202D Unspecified fracture of upper end of left humerus, subsequent encounter for fracture with routine healing: Secondary | ICD-10-CM | POA: Diagnosis not present

## 2020-01-12 HISTORY — DX: Personal history of urinary calculi: Z87.442

## 2020-01-12 HISTORY — DX: Other specified postprocedural states: Z98.890

## 2020-01-12 HISTORY — DX: Hypothyroidism, unspecified: E03.9

## 2020-01-12 HISTORY — PX: ORIF HUMERUS FRACTURE: SHX2126

## 2020-01-12 HISTORY — DX: Other specified postprocedural states: R11.2

## 2020-01-12 HISTORY — DX: Gastro-esophageal reflux disease without esophagitis: K21.9

## 2020-01-12 LAB — CBC
HCT: 41.4 % (ref 36.0–46.0)
Hemoglobin: 13.4 g/dL (ref 12.0–15.0)
MCH: 30.5 pg (ref 26.0–34.0)
MCHC: 32.4 g/dL (ref 30.0–36.0)
MCV: 94.1 fL (ref 80.0–100.0)
Platelets: 283 10*3/uL (ref 150–400)
RBC: 4.4 MIL/uL (ref 3.87–5.11)
RDW: 13 % (ref 11.5–15.5)
WBC: 8.2 10*3/uL (ref 4.0–10.5)
nRBC: 0 % (ref 0.0–0.2)

## 2020-01-12 LAB — BASIC METABOLIC PANEL
Anion gap: 11 (ref 5–15)
BUN: 17 mg/dL (ref 8–23)
CO2: 26 mmol/L (ref 22–32)
Calcium: 10 mg/dL (ref 8.9–10.3)
Chloride: 101 mmol/L (ref 98–111)
Creatinine, Ser: 0.72 mg/dL (ref 0.44–1.00)
GFR calc Af Amer: >60 mL/min (ref >60)
GFR calc non Af Amer: >60 mL/min (ref >60)
Glucose, Bld: 95 mg/dL (ref 70–99)
Potassium: 4.4 mmol/L (ref 3.5–5.1)
Sodium: 138 mmol/L (ref 135–145)

## 2020-01-12 LAB — SARS CORONAVIRUS 2 BY RT PCR (HOSPITAL ORDER, PERFORMED IN ~~LOC~~ HOSPITAL LAB): SARS Coronavirus 2: NEGATIVE

## 2020-01-12 SURGERY — OPEN REDUCTION INTERNAL FIXATION (ORIF) PROXIMAL HUMERUS FRACTURE
Anesthesia: General | Site: Arm Upper | Laterality: Left

## 2020-01-12 MED ORDER — FENTANYL CITRATE (PF) 100 MCG/2ML IJ SOLN: 50.0000 ug | Freq: Once | INTRAMUSCULAR | Status: AC

## 2020-01-12 MED ORDER — OXYCODONE HCL 5 MG PO TABS
5.0000 mg | ORAL_TABLET | Freq: Once | ORAL | Status: AC | PRN
  Administered 2020-01-12: 5 mg via ORAL

## 2020-01-12 MED ORDER — FENTANYL CITRATE (PF) 100 MCG/2ML IJ SOLN
INTRAMUSCULAR | Status: AC
  Administered 2020-01-12: 50 ug via INTRAVENOUS
  Filled 2020-01-12: qty 2

## 2020-01-12 MED ORDER — MIDAZOLAM HCL 2 MG/2ML IJ SOLN
INTRAMUSCULAR | Status: AC
  Administered 2020-01-12: 1 mg via INTRAVENOUS
  Filled 2020-01-12: qty 2

## 2020-01-12 MED ORDER — ONDANSETRON HCL 4 MG/2ML IJ SOLN
INTRAMUSCULAR | Status: DC | PRN
  Administered 2020-01-12: 4 mg via INTRAVENOUS

## 2020-01-12 MED ORDER — DOCUSATE SODIUM 100 MG PO CAPS
100.0000 mg | ORAL_CAPSULE | Freq: Every day | ORAL | Status: DC
  Administered 2020-01-13 – 2020-01-15 (×3): 100 mg via ORAL
  Filled 2020-01-12 (×3): qty 1

## 2020-01-12 MED ORDER — OXYCODONE HCL 5 MG PO TABS
10.0000 mg | ORAL_TABLET | ORAL | Status: DC | PRN
  Administered 2020-01-13: 10 mg via ORAL

## 2020-01-12 MED ORDER — CEFAZOLIN SODIUM-DEXTROSE 2-3 GM-%(50ML) IV SOLR
INTRAVENOUS | Status: DC | PRN
  Administered 2020-01-12: 2 g via INTRAVENOUS

## 2020-01-12 MED ORDER — ORAL CARE MOUTH RINSE: 15.0000 mL | Freq: Once | OROMUCOSAL | Status: AC

## 2020-01-12 MED ORDER — CALCIUM CARBONATE 1250 (500 CA) MG PO TABS
500.0000 mg | ORAL_TABLET | Freq: Every day | ORAL | Status: DC
  Administered 2020-01-13 – 2020-01-15 (×3): 500 mg via ORAL
  Filled 2020-01-12 (×3): qty 1

## 2020-01-12 MED ORDER — CALCIUM CARBONATE 1250 (500 CA) MG PO TABS
2.0000 | ORAL_TABLET | Freq: Every day | ORAL | Status: DC
  Administered 2020-01-13 – 2020-01-14 (×2): 1000 mg via ORAL
  Filled 2020-01-12 (×3): qty 1

## 2020-01-12 MED ORDER — ONDANSETRON HCL 4 MG/2ML IJ SOLN: 4.0000 mg | Freq: Four times a day (QID) | INTRAMUSCULAR | Status: DC | PRN

## 2020-01-12 MED ORDER — LOSARTAN POTASSIUM 50 MG PO TABS
100.0000 mg | ORAL_TABLET | Freq: Every day | ORAL | Status: DC
  Administered 2020-01-12 – 2020-01-15 (×4): 100 mg via ORAL
  Filled 2020-01-12 (×4): qty 2

## 2020-01-12 MED ORDER — PHENYLEPHRINE HCL-NACL 10-0.9 MG/250ML-% IV SOLN
INTRAVENOUS | Status: DC | PRN
  Administered 2020-01-12: 50 ug/min via INTRAVENOUS

## 2020-01-12 MED ORDER — EPHEDRINE SULFATE-NACL 50-0.9 MG/10ML-% IV SOSY
PREFILLED_SYRINGE | INTRAVENOUS | Status: DC | PRN
  Administered 2020-01-12 (×5): 5 mg via INTRAVENOUS

## 2020-01-12 MED ORDER — ONDANSETRON HCL 4 MG/2ML IJ SOLN
INTRAMUSCULAR | Status: AC
  Filled 2020-01-12: qty 2

## 2020-01-12 MED ORDER — FENTANYL CITRATE (PF) 250 MCG/5ML IJ SOLN
INTRAMUSCULAR | Status: DC | PRN
  Administered 2020-01-12: 100 ug via INTRAVENOUS

## 2020-01-12 MED ORDER — ALBUTEROL SULFATE HFA 108 (90 BASE) MCG/ACT IN AERS: 2.0000 | INHALATION_SPRAY | Freq: Four times a day (QID) | RESPIRATORY_TRACT | Status: DC | PRN

## 2020-01-12 MED ORDER — LACTATED RINGERS IV SOLN: INTRAVENOUS | Status: DC

## 2020-01-12 MED ORDER — ALPRAZOLAM 0.5 MG PO TABS: 0.5000 mg | ORAL_TABLET | Freq: Four times a day (QID) | ORAL | Status: DC | PRN

## 2020-01-12 MED ORDER — ACETAMINOPHEN 325 MG PO TABS
325.0000 mg | ORAL_TABLET | Freq: Four times a day (QID) | ORAL | Status: DC | PRN
  Administered 2020-01-13 – 2020-01-14 (×3): 650 mg via ORAL
  Filled 2020-01-12 (×4): qty 2

## 2020-01-12 MED ORDER — OXYBUTYNIN CHLORIDE 5 MG PO TABS
10.0000 mg | ORAL_TABLET | Freq: Two times a day (BID) | ORAL | Status: DC
  Administered 2020-01-12 – 2020-01-15 (×6): 10 mg via ORAL
  Filled 2020-01-12 (×6): qty 2

## 2020-01-12 MED ORDER — OXYCODONE HCL 5 MG PO TABS
5.0000 mg | ORAL_TABLET | ORAL | Status: DC | PRN
  Administered 2020-01-13 – 2020-01-14 (×3): 10 mg via ORAL
  Filled 2020-01-12 (×5): qty 2

## 2020-01-12 MED ORDER — CEFAZOLIN SODIUM-DEXTROSE 1-4 GM/50ML-% IV SOLN
1.0000 g | INTRAVENOUS | Status: AC
  Filled 2020-01-12: qty 50

## 2020-01-12 MED ORDER — HYDROCHLOROTHIAZIDE 12.5 MG PO CAPS
12.5000 mg | ORAL_CAPSULE | Freq: Every day | ORAL | Status: DC
  Administered 2020-01-12 – 2020-01-15 (×4): 12.5 mg via ORAL
  Filled 2020-01-12 (×4): qty 1

## 2020-01-12 MED ORDER — LIDOCAINE 2% (20 MG/ML) 5 ML SYRINGE
INTRAMUSCULAR | Status: DC | PRN
  Administered 2020-01-12: 80 mg via INTRAVENOUS

## 2020-01-12 MED ORDER — LEVOTHYROXINE SODIUM 50 MCG PO TABS
50.0000 ug | ORAL_TABLET | Freq: Every day | ORAL | Status: DC
  Administered 2020-01-13 – 2020-01-15 (×3): 50 ug via ORAL
  Filled 2020-01-12 (×3): qty 1

## 2020-01-12 MED ORDER — BUPIVACAINE-EPINEPHRINE 0.5% -1:200000 IJ SOLN
INTRAMUSCULAR | Status: AC
  Filled 2020-01-12: qty 1

## 2020-01-12 MED ORDER — PANTOPRAZOLE SODIUM 40 MG PO TBEC
40.0000 mg | DELAYED_RELEASE_TABLET | Freq: Every day | ORAL | Status: DC
  Administered 2020-01-13 – 2020-01-15 (×3): 40 mg via ORAL
  Filled 2020-01-12 (×3): qty 1

## 2020-01-12 MED ORDER — OXYCODONE HCL 5 MG PO TABS
ORAL_TABLET | ORAL | Status: AC
  Filled 2020-01-12: qty 1

## 2020-01-12 MED ORDER — ONDANSETRON HCL 4 MG PO TABS: 4.0000 mg | ORAL_TABLET | Freq: Four times a day (QID) | ORAL | Status: DC | PRN

## 2020-01-12 MED ORDER — EPHEDRINE 5 MG/ML INJ
INTRAVENOUS | Status: AC
  Filled 2020-01-12: qty 10

## 2020-01-12 MED ORDER — LOSARTAN POTASSIUM-HCTZ 100-12.5 MG PO TABS: 1.0000 | ORAL_TABLET | Freq: Every day | ORAL | Status: DC

## 2020-01-12 MED ORDER — FENTANYL CITRATE (PF) 100 MCG/2ML IJ SOLN
INTRAMUSCULAR | Status: AC
  Filled 2020-01-12: qty 2

## 2020-01-12 MED ORDER — CEFAZOLIN SODIUM 1 G IJ SOLR
INTRAMUSCULAR | Status: AC
  Filled 2020-01-12: qty 20

## 2020-01-12 MED ORDER — METHOCARBAMOL 500 MG PO TABS
500.0000 mg | ORAL_TABLET | Freq: Four times a day (QID) | ORAL | Status: DC | PRN
  Administered 2020-01-12 – 2020-01-14 (×4): 500 mg via ORAL
  Filled 2020-01-12 (×4): qty 1

## 2020-01-12 MED ORDER — CEFAZOLIN SODIUM-DEXTROSE 1-4 GM/50ML-% IV SOLN
1.0000 g | Freq: Three times a day (TID) | INTRAVENOUS | Status: DC
  Administered 2020-01-12 – 2020-01-15 (×8): 1 g via INTRAVENOUS
  Filled 2020-01-12 (×11): qty 50

## 2020-01-12 MED ORDER — SUGAMMADEX SODIUM 200 MG/2ML IV SOLN
INTRAVENOUS | Status: DC | PRN
  Administered 2020-01-12: 250 mg via INTRAVENOUS

## 2020-01-12 MED ORDER — BUPIVACAINE-EPINEPHRINE (PF) 0.5% -1:200000 IJ SOLN
INTRAMUSCULAR | Status: DC | PRN
  Administered 2020-01-12: 30 mL via PERINEURAL

## 2020-01-12 MED ORDER — DULOXETINE HCL 30 MG PO CPEP
30.0000 mg | ORAL_CAPSULE | Freq: Every day | ORAL | Status: DC
  Administered 2020-01-13 – 2020-01-15 (×3): 30 mg via ORAL
  Filled 2020-01-12 (×3): qty 1

## 2020-01-12 MED ORDER — ASCORBIC ACID 500 MG PO TABS
1000.0000 mg | ORAL_TABLET | Freq: Every day | ORAL | Status: DC
  Administered 2020-01-13 – 2020-01-15 (×3): 1000 mg via ORAL
  Filled 2020-01-12 (×3): qty 2

## 2020-01-12 MED ORDER — PROPOFOL 10 MG/ML IV BOLUS
INTRAVENOUS | Status: AC
  Filled 2020-01-12: qty 20

## 2020-01-12 MED ORDER — ROCURONIUM BROMIDE 10 MG/ML (PF) SYRINGE
PREFILLED_SYRINGE | INTRAVENOUS | Status: DC | PRN
  Administered 2020-01-12: 100 mg via INTRAVENOUS

## 2020-01-12 MED ORDER — OXYCODONE HCL 5 MG/5ML PO SOLN: 5.0000 mg | Freq: Once | ORAL | Status: AC | PRN

## 2020-01-12 MED ORDER — MIDAZOLAM HCL 2 MG/2ML IJ SOLN: 1.0000 mg | Freq: Once | INTRAMUSCULAR | Status: AC

## 2020-01-12 MED ORDER — ACETAMINOPHEN 500 MG PO TABS
1000.0000 mg | ORAL_TABLET | Freq: Four times a day (QID) | ORAL | Status: AC
  Administered 2020-01-12 – 2020-01-13 (×3): 1000 mg via ORAL
  Filled 2020-01-12 (×3): qty 2

## 2020-01-12 MED ORDER — PANTOPRAZOLE SODIUM 40 MG PO TBEC: DELAYED_RELEASE_TABLET | ORAL | 0 refills | Status: DC

## 2020-01-12 MED ORDER — 0.9 % SODIUM CHLORIDE (POUR BTL) OPTIME
TOPICAL | Status: DC | PRN
  Administered 2020-01-12: 1000 mL

## 2020-01-12 MED ORDER — PROPOFOL 10 MG/ML IV BOLUS
INTRAVENOUS | Status: DC | PRN
  Administered 2020-01-12: 140 mg via INTRAVENOUS

## 2020-01-12 MED ORDER — FENTANYL CITRATE (PF) 250 MCG/5ML IJ SOLN
INTRAMUSCULAR | Status: AC
  Filled 2020-01-12: qty 5

## 2020-01-12 MED ORDER — HYDROMORPHONE HCL 1 MG/ML IJ SOLN
0.5000 mg | INTRAMUSCULAR | Status: DC | PRN
  Administered 2020-01-13: 1 mg via INTRAVENOUS
  Filled 2020-01-12: qty 1

## 2020-01-12 MED ORDER — PROMETHAZINE HCL 12.5 MG RE SUPP
12.5000 mg | Freq: Four times a day (QID) | RECTAL | Status: DC | PRN
  Filled 2020-01-12: qty 1

## 2020-01-12 MED ORDER — ALBUTEROL SULFATE (2.5 MG/3ML) 0.083% IN NEBU: 2.5000 mg | INHALATION_SOLUTION | Freq: Four times a day (QID) | RESPIRATORY_TRACT | Status: DC | PRN

## 2020-01-12 MED ORDER — DEXAMETHASONE SODIUM PHOSPHATE 10 MG/ML IJ SOLN
INTRAMUSCULAR | Status: DC | PRN
Start: 2020-01-12 — End: 2020-01-12
  Administered 2020-01-12: 5 mg via INTRAVENOUS

## 2020-01-12 MED ORDER — PHENYLEPHRINE 40 MCG/ML (10ML) SYRINGE FOR IV PUSH (FOR BLOOD PRESSURE SUPPORT)
PREFILLED_SYRINGE | INTRAVENOUS | Status: DC | PRN
  Administered 2020-01-12: 120 ug via INTRAVENOUS

## 2020-01-12 MED ORDER — DEXAMETHASONE SODIUM PHOSPHATE 10 MG/ML IJ SOLN
INTRAMUSCULAR | Status: AC
  Filled 2020-01-12: qty 1

## 2020-01-12 MED ORDER — FENTANYL CITRATE (PF) 100 MCG/2ML IJ SOLN
25.0000 ug | INTRAMUSCULAR | Status: DC | PRN
  Administered 2020-01-12: 50 ug via INTRAVENOUS
  Administered 2020-01-12: 25 ug via INTRAVENOUS

## 2020-01-12 MED ORDER — ASPIRIN EC 81 MG PO TBEC
81.0000 mg | DELAYED_RELEASE_TABLET | Freq: Every day | ORAL | Status: DC
  Administered 2020-01-12 – 2020-01-15 (×4): 81 mg via ORAL
  Filled 2020-01-12 (×4): qty 1

## 2020-01-12 MED ORDER — FUROSEMIDE 20 MG PO TABS: 20.0000 mg | ORAL_TABLET | Freq: Every day | ORAL | Status: DC | PRN

## 2020-01-12 MED ORDER — CHLORHEXIDINE GLUCONATE 0.12 % MT SOLN
15.0000 mL | Freq: Once | OROMUCOSAL | Status: AC
  Administered 2020-01-12: 15 mL via OROMUCOSAL
  Filled 2020-01-12: qty 15

## 2020-01-12 MED ORDER — VITAMIN D 25 MCG (1000 UNIT) PO TABS
2000.0000 [IU] | ORAL_TABLET | Freq: Every day | ORAL | Status: DC
  Administered 2020-01-13 – 2020-01-15 (×3): 2000 [IU] via ORAL
  Filled 2020-01-12 (×3): qty 2

## 2020-01-12 SURGICAL SUPPLY — 89 items
BIT DRILL 110X2.5XQCK CNCT (BIT) IMPLANT
BIT DRILL 2.5 (BIT) ×3
BIT DRILL 2.5X205 (BIT) IMPLANT
BIT DRILL QC 110 3.5 (BIT) ×1
BIT DRILL QC 110 3.5MM (BIT) IMPLANT
BIT DRL 110X2.5XQCK CNCT (BIT) ×1
BNDG ELASTIC 4X5.8 VLCR STR LF (GAUZE/BANDAGES/DRESSINGS) ×4 IMPLANT
BNDG GAUZE ELAST 4 BULKY (GAUZE/BANDAGES/DRESSINGS) ×6 IMPLANT
CLOSURE WOUND 1/2 X4 (GAUZE/BANDAGES/DRESSINGS) ×1
COVER LIGHT HANDLE STERIS (MISCELLANEOUS) ×2 IMPLANT
COVER WAND RF STERILE (DRAPES) ×1 IMPLANT
DRAPE C-ARM 42X72 X-RAY (DRAPES) ×3 IMPLANT
DRAPE IMP U-DRAPE 54X76 (DRAPES) ×3 IMPLANT
DRAPE INCISE IOBAN 66X45 STRL (DRAPES) ×3 IMPLANT
DRAPE ORTHO SPLIT ENLRG (DRAPES) ×4 IMPLANT
DRAPE POUCH INSTRU U-SHP 10X18 (DRAPES) ×3 IMPLANT
DRAPE U-SHAPE 47X51 STRL (DRAPES) ×3 IMPLANT
DRILL BIT 2.5X205 (BIT) ×3
DRILL BIT QC 110 3.5MM (BIT) ×3
DRSG EMULSION OIL 3X3 NADH (GAUZE/BANDAGES/DRESSINGS) ×3 IMPLANT
DRSG MEPILEX BORDER 4X12 (GAUZE/BANDAGES/DRESSINGS) ×2 IMPLANT
DRSG PAD ABDOMINAL 8X10 ST (GAUZE/BANDAGES/DRESSINGS) ×1 IMPLANT
ELECT REM PT RETURN 9FT ADLT (ELECTROSURGICAL) ×3
ELECTRODE REM PT RTRN 9FT ADLT (ELECTROSURGICAL) ×1 IMPLANT
GAUZE SPONGE 4X4 12PLY STRL (GAUZE/BANDAGES/DRESSINGS) ×3 IMPLANT
GLOVE BIO SURGEON STRL SZ 6.5 (GLOVE) ×1 IMPLANT
GLOVE BIO SURGEON STRL SZ7.5 (GLOVE) ×3 IMPLANT
GLOVE BIO SURGEONS STRL SZ 6.5 (GLOVE) ×1
GLOVE BIOGEL M 8.0 STRL (GLOVE) ×5 IMPLANT
GLOVE BIOGEL PI IND STRL 6.5 (GLOVE) IMPLANT
GLOVE BIOGEL PI IND STRL 8 (GLOVE) IMPLANT
GLOVE BIOGEL PI INDICATOR 6.5 (GLOVE) ×2
GLOVE BIOGEL PI INDICATOR 8 (GLOVE) ×2
GOWN SPEC L3 XXLG W/TWL (GOWN DISPOSABLE) ×2 IMPLANT
GOWN STRL REUS W/ TWL LRG LVL3 (GOWN DISPOSABLE) ×2 IMPLANT
GOWN STRL REUS W/ TWL XL LVL3 (GOWN DISPOSABLE) ×2 IMPLANT
GOWN STRL REUS W/TWL LRG LVL3 (GOWN DISPOSABLE) ×6
GOWN STRL REUS W/TWL XL LVL3 (GOWN DISPOSABLE) ×9
KIT BASIN OR (CUSTOM PROCEDURE TRAY) ×3 IMPLANT
KIT TURNOVER KIT B (KITS) ×3 IMPLANT
MANIFOLD NEPTUNE II (INSTRUMENTS) ×3 IMPLANT
NDL SUT 6 .5 CRC .975X.05 MAYO (NEEDLE) ×1 IMPLANT
NEEDLE 22X1 1/2 (OR ONLY) (NEEDLE) IMPLANT
NEEDLE MAYO TAPER (NEEDLE) ×3
NS IRRIG 1000ML POUR BTL (IV SOLUTION) ×3 IMPLANT
PACK SHOULDER (CUSTOM PROCEDURE TRAY) ×3 IMPLANT
PACK UNIVERSAL I (CUSTOM PROCEDURE TRAY) ×1 IMPLANT
PAD ARMBOARD 7.5X6 YLW CONV (MISCELLANEOUS) ×6 IMPLANT
PAD CAST 4YDX4 CTTN HI CHSV (CAST SUPPLIES) IMPLANT
PADDING CAST ABS 4INX4YD NS (CAST SUPPLIES) ×4
PADDING CAST ABS COTTON 4X4 ST (CAST SUPPLIES) IMPLANT
PADDING CAST COTTON 4X4 STRL (CAST SUPPLIES) ×3
PLATE BONE DUAL COMP 14H 183MM (Plate) ×2 IMPLANT
SCREW CORT 2.5X20X3.5XST SM (Screw) IMPLANT
SCREW CORT 2.5X24X3.5XST SM (Screw) IMPLANT
SCREW CORT 2.5X28X3.5XST SM (Screw) IMPLANT
SCREW CORT 2.5X30X3.5 SM (Screw) IMPLANT
SCREW CORT S/T 3.5X22 (Screw) ×10 IMPLANT
SCREW CORTICAL 3.5X20 (Screw) ×3 IMPLANT
SCREW CORTICAL 3.5X24MM (Screw) ×3 IMPLANT
SCREW CORTICAL 3.5X26 (Screw) ×12 IMPLANT
SCREW CORTICAL 3.5X28MM (Screw) ×3 IMPLANT
SCREW CORTICAL 3.5X30MM (Screw) ×3 IMPLANT
SLING ARM IMMOBILIZER LRG (SOFTGOODS) ×2 IMPLANT
SOL PREP POV-IOD 4OZ 10% (MISCELLANEOUS) ×6 IMPLANT
SPLINT FIBERGLASS 3X35 (CAST SUPPLIES) ×2 IMPLANT
SPONGE LAP 18X18 RF (DISPOSABLE) ×2 IMPLANT
SPONGE LAP 4X18 RFD (DISPOSABLE) ×6 IMPLANT
STOCKINETTE 4X48 STRL (DRAPES) ×2 IMPLANT
STRIP CLOSURE SKIN 1/2X4 (GAUZE/BANDAGES/DRESSINGS) ×2 IMPLANT
SUCTION FRAZIER HANDLE 10FR (MISCELLANEOUS) ×3
SUCTION TUBE FRAZIER 10FR DISP (MISCELLANEOUS) ×1 IMPLANT
SUT BONE WAX W31G (SUTURE) IMPLANT
SUT ETHIBOND NAB CT1 #1 30IN (SUTURE) ×6 IMPLANT
SUT FIBERWIRE #2 38 REV NDL BL (SUTURE)
SUT MNCRL AB 3-0 PS2 18 (SUTURE) ×3 IMPLANT
SUT PROLENE 3 0 PS 2 (SUTURE) ×8 IMPLANT
SUT SILK 2 0 (SUTURE) ×3
SUT SILK 2-0 18XBRD TIE 12 (SUTURE) IMPLANT
SUT VIC AB 0 CT1 27 (SUTURE) ×3
SUT VIC AB 0 CT1 27XBRD ANBCTR (SUTURE) ×1 IMPLANT
SUT VIC AB 2-0 CTB1 (SUTURE) ×2 IMPLANT
SUT VICRYL 4-0 PS2 18IN ABS (SUTURE) ×3 IMPLANT
SUTURE FIBERWR#2 38 REV NDL BL (SUTURE) IMPLANT
SYR CONTROL 10ML LL (SYRINGE) ×3 IMPLANT
TOWEL GREEN STERILE (TOWEL DISPOSABLE) ×3 IMPLANT
TOWEL GREEN STERILE FF (TOWEL DISPOSABLE) ×3 IMPLANT
WATER STERILE IRR 1000ML POUR (IV SOLUTION) ×3 IMPLANT
YANKAUER SUCT BULB TIP NO VENT (SUCTIONS) IMPLANT

## 2020-01-12 NOTE — Anesthesia Preprocedure Evaluation (Addendum)
Anesthesia Evaluation  Patient identified by MRN, date of birth, ID band Patient awake    Reviewed: Allergy & Precautions, H&P , NPO status , Patient's Chart, lab work & pertinent test results  History of Anesthesia Complications (+) PONV and history of anesthetic complications  Airway Mallampati: II   Neck ROM: full    Dental   Pulmonary asthma , sleep apnea ,    breath sounds clear to auscultation       Cardiovascular hypertension,  Rhythm:regular Rate:Normal     Neuro/Psych    GI/Hepatic hiatal hernia, GERD  ,  Endo/Other  Hypothyroidism Morbid obesity  Renal/GU      Musculoskeletal  (+) Arthritis ,   Abdominal   Peds  Hematology   Anesthesia Other Findings   Reproductive/Obstetrics                             Anesthesia Physical Anesthesia Plan  ASA: III  Anesthesia Plan: General   Post-op Pain Management:  Regional for Post-op pain   Induction: Intravenous  PONV Risk Score and Plan: 4 or greater and Ondansetron, Dexamethasone and Treatment may vary due to age or medical condition  Airway Management Planned: Oral ETT  Additional Equipment:   Intra-op Plan:   Post-operative Plan: Extubation in OR  Informed Consent: I have reviewed the patients History and Physical, chart, labs and discussed the procedure including the risks, benefits and alternatives for the proposed anesthesia with the patient or authorized representative who has indicated his/her understanding and acceptance.       Plan Discussed with: CRNA, Anesthesiologist and Surgeon  Anesthesia Plan Comments:         Anesthesia Quick Evaluation

## 2020-01-12 NOTE — Anesthesia Procedure Notes (Signed)
Anesthesia Regional Block: Supraclavicular block   Pre-Anesthetic Checklist: ,, timeout performed, Correct Patient, Correct Site, Correct Laterality, Correct Procedure, Correct Position, site marked, Risks and benefits discussed, pre-op evaluation,  At surgeon's request and post-op pain management  Laterality: Left  Prep: Maximum Sterile Barrier Precautions used, chloraprep       Needles:  Injection technique: Single-shot  Needle Type: Echogenic Stimulator Needle     Needle Length: 9cm  Needle Gauge: 22     Additional Needles:   Procedures:,,,, ultrasound used (permanent image in chart),,,,  Narrative:  Start time: 01/12/2020 1:48 PM End time: 01/12/2020 1:51 PM Injection made incrementally with aspirations every 5 mL.  Performed by: Personally  Anesthesiologist: Brennan Bailey, MD  Additional Notes: Risks, benefits, and alternative discussed. Patient gave consent for procedure. Patient prepped and draped in sterile fashion. Sedation administered, patient remains easily responsive to voice. Relevant anatomy identified with ultrasound guidance. Local anesthetic given in 5cc increments with no signs or symptoms of intravascular injection. No pain or paraesthesias with injection. Patient monitored throughout procedure with signs of LAST or immediate complications. Tolerated well. Ultrasound image placed in chart.  Tawny Asal, MD

## 2020-01-12 NOTE — H&P (Signed)
Stacie Russell is an 69 y.o. female.   Chief Complaint: Comminuted complex left displaced humerus fracture HPI: Patient presents for surgical management algorithm of a complex left humerus fracture three-part with butterfly fragment.  I discussed with patient risk and benefit profile do's and don'ts and other issues germane to her predicament.  With this and I will proceed accordingly.  Patient presents for evaluation and treatment of the of their upper extremity predicament. The patient denies neck, back, chest or  abdominal pain. The patient notes that they have no lower extremity problems. The patients primary complaint is noted. We are planning surgical care pathway for the upper extremity.  Past Medical History:  Diagnosis Date  . Arthritis   . Asthma   . Diverticulosis   . Endometrial hyperplasia   . Esophageal stricture   . Foot pain   . Fundic gland polyps of stomach, benign 07/10/2015  . Gastritis   . GERD (gastroesophageal reflux disease)   . Hemorrhoid   . Hiatal hernia   . Hip pain   . History of kidney stones    cystoscopy  . Hyperlipidemia   . Hypertension   . Hypothyroidism   . Knee pain   . Metabolic syndrome   . Obstructive sleep apnea 08/27/2017  . Oxygen deficiency    pt denies  . PONV (postoperative nausea and vomiting)   . Tendonitis of ankle   . Vitamin D deficiency     Past Surgical History:  Procedure Laterality Date  . ANAL FISSURE REPAIR    . CATARACT EXTRACTION  May 2009  . COLONOSCOPY  12/26/07  . ESOPHAGEAL DILATION    . ESOPHAGOGASTRODUODENOSCOPY  12/26/07, 05/13/08  . EYE SURGERY Bilateral    bilateral cataract with lens implants  . KNEE CARTILAGE SURGERY Right Sept 1999  . 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 UMBILICAL HERNIA REPAIR  05/20/06    Family History  Problem  Relation Age of Onset  . Cancer Mother        Liver, stomach, colon  . Colon cancer Mother   . Heart attack Father   . Heart disease Sister   . Breast cancer Sister   . Breast cancer Sister   . Esophageal cancer Neg Hx   . Stomach cancer Neg Hx   . Rectal cancer Neg Hx    Social History:  reports that she has never smoked. She has never used smokeless tobacco. She reports that she does not drink alcohol or use drugs.  Allergies: No Known Allergies  Medications Prior to Admission  Medication Sig Dispense Refill  . acetaminophen (TYLENOL) 500 MG tablet Take 500 mg by mouth every 6 (six) hours as needed for moderate pain.     . calcium carbonate (OSCAL) 1500 (600 Ca) MG TABS tablet Take 600 mg of elemental calcium by mouth 2 (two) times daily with a meal. 600 mg in am and 900 mg at night    . calcium carbonate (TUMS - DOSED IN MG ELEMENTAL CALCIUM) 500 MG chewable tablet Chew 1 tablet by mouth daily.    . Cholecalciferol (VITAMIN D3) 2000 units TABS Take 1 tablet by mouth daily.    Marland Kitchen docusate sodium (COLACE) 100 MG capsule Take 100 mg by mouth daily.    . DULoxetine (CYMBALTA) 30 MG capsule TAKE 1 CAPSULE BY MOUTH  DAILY 90 capsule  0  . levothyroxine (SYNTHROID) 50 MCG tablet TAKE 1 TABLET BY MOUTH  DAILY BEFORE BREAKFAST 90 tablet 0  . losartan-hydrochlorothiazide (HYZAAR) 100-12.5 MG tablet TAKE 1 TABLET BY MOUTH  DAILY 90 tablet 3  . methocarbamol (ROBAXIN) 500 MG tablet Take 500 mg by mouth every 6 (six) hours as needed for muscle spasms.    . Omega-3 Fatty Acids (FISH OIL) 1000 MG CAPS Take 1 capsule by mouth every evening.     Marland Kitchen oxybutynin (DITROPAN) 5 MG tablet TAKE 2 TABLETS BY MOUTH  TWICE DAILY (Patient taking differently: 10 mg. ) 360 tablet 3  . oxyCODONE-acetaminophen (PERCOCET/ROXICET) 5-325 MG tablet Take 1 tablet by mouth every 4 (four) hours as needed for severe pain. 15 tablet 0  . pantoprazole (PROTONIX) 40 MG tablet TAKE 1 TABLET BY MOUTH ONCE DAILY BEFORE BREAKFAST 90  tablet 0  . albuterol (PROVENTIL HFA;VENTOLIN HFA) 108 (90 Base) MCG/ACT inhaler Inhale 2 puffs into the lungs every 6 (six) hours as needed for wheezing or shortness of breath.    Marland Kitchen aspirin EC 81 MG tablet Take 81 mg by mouth daily.    . budesonide-formoterol (SYMBICORT) 80-4.5 MCG/ACT inhaler Inhale 2 puffs into the lungs 2 (two) times daily. (Patient taking differently: Inhale 2 puffs into the lungs 2 (two) times daily as needed (sob/wheezing). ) 1 Inhaler 0  . furosemide (LASIX) 20 MG tablet TAKE 1 TABLET BY MOUTH  DAILY AS NEEDED (Patient taking differently: Take 20 mg by mouth daily as needed for fluid. ) 90 tablet 1    Results for orders placed or performed during the hospital encounter of 01/12/20 (from the past 48 hour(s))  CBC     Status: None   Collection Time: 01/12/20 11:16 AM  Result Value Ref Range   WBC 8.2 4.0 - 10.5 K/uL   RBC 4.40 3.87 - 5.11 MIL/uL   Hemoglobin 13.4 12.0 - 15.0 g/dL   HCT 41.4 36.0 - 46.0 %   MCV 94.1 80.0 - 100.0 fL   MCH 30.5 26.0 - 34.0 pg   MCHC 32.4 30.0 - 36.0 g/dL   RDW 13.0 11.5 - 15.5 %   Platelets 283 150 - 400 K/uL   nRBC 0.0 0.0 - 0.2 %    Comment: Performed at [facility], [place] [address], [place], [place] [id number]  Basic metabolic panel     Status: None   Collection Time: 01/12/20 11:16 AM  Result Value Ref Range   Sodium 138 135 - 145 mmol/L   Potassium 4.4 3.5 - 5.1 mmol/L   Chloride 101 98 - 111 mmol/L   CO2 26 22 - 32 mmol/L   Glucose, Bld 95 70 - 99 mg/dL    Comment: Glucose reference range applies only to samples taken after fasting for at least 8 hours.   BUN 17 8 - 23 mg/dL   Creatinine, Ser 0.72 0.44 - 1.00 mg/dL   Calcium 10.0 8.9 - 10.3 mg/dL   GFR calc non Af Amer >60 >60 mL/min   GFR calc Af Amer >60 >60 mL/min   Anion gap 11 5 - 15    Comment: Performed at [facility] [address], [place], [place] [address]  SARS Coronavirus 2 by RT PCR (hospital order, performed in [facility] hospital lab)  Nasopharyngeal Nasopharyngeal Swab     Status: None   Collection Time: 01/12/20 11:38 AM   Specimen: Nasopharyngeal Swab  Result Value Ref Range   SARS Coronavirus 2 NEGATIVE  NEGATIVE    Comment: (NOTE) SARS-CoV-2 target nucleic acids are NOT DETECTED. The SARS-CoV-2 RNA is generally detectable in upper and lower respiratory specimens during the acute phase of infection. The lowest concentration of SARS-CoV-2 viral copies this assay can detect is 250 copies / mL. A negative result does not preclude SARS-CoV-2 infection and should not be used as the sole basis for treatment or other patient management decisions.  A negative result may occur with improper specimen collection / handling, submission of specimen other than nasopharyngeal swab, presence of viral mutation(s) within the areas targeted by this assay, and inadequate number of viral copies (<250 copies / mL). A negative result must be combined with clinical observations, patient history, and epidemiological information. Fact Sheet for Patients:   StrictlyIdeas.no Fact Sheet for Healthcare Providers: BankingDealers.co.za This test is not yet approved or cleared  by the [place] FDA and has been authorized for detection and/or diagnosis of SARS-CoV-2 by FDA under an Emergency Use Authorization (EUA).  This EUA will remain in effect (meaning this test can be used) for the duration of the COVID-19 declaration under Section 564(b)(1) of the Act, 21 U.S.C. section 360bbb-3(b)(1), unless the authorization is terminated or revoked sooner. Performed at [facility], [place] [address] [place], [place] [address]    No results found.  Review of Systems  Respiratory: Negative.   Cardiovascular: Negative.   Gastrointestinal: Negative.   Endocrine: Negative.     Blood pressure (!) 163/74, pulse 76, temperature 98.6 F (37 C), temperature source Oral, resp. rate 17, height 5\' 1"   (1.549 m), weight 108.9 kg, SpO2 97 %. Physical Exam  Comminuted complex humerus fracture skin is stable radial nerve is intact at present juncture.  Ulnar nerve is intact median nerve is intact.  There is no evidence of compartment syndrome  She and I discussed all issues plans and concerns.  She is morbidly obese.  No signs of DVT or infection no signs of vascular compromise.  The patient is alert and oriented in no acute distress. The patient complains of pain in the affected upper extremity.  The patient is noted to have a normal HEENT exam. Lung fields show equal chest expansion and no shortness of breath. Abdomen exam is nontender without distention. Lower extremity examination does not show any fracture dislocation or blood clot symptoms. Pelvis is stable and the neck and back are stable and nontender. Assessment/Plan We will plan for open reduction internal fixation left humerus fracture.  Patient understands risk and benefits of radial nerve injury compartment syndrome dystrophy infection and other issues.  With this in mind we will proceed accordingly.  I have counseled her extensively in regards to risk and benefit profiles and other issues including her significant BMI as negative prognostic indicators.  Nevertheless she has gross displacement is very painful and needs certain and definitive surgical stabilization.  We are planning surgery for your upper extremity. The risk and benefits of surgery to include risk of bleeding, infection, anesthesia,  damage to normal structures and failure of the surgery to accomplish its intended goals of relieving symptoms and restoring function have been discussed in detail. With this in mind we plan to proceed. I have specifically discussed with the patient the pre-and postoperative regime and the dos and don'ts and risk and benefits in great detail. Risk and benefits of surgery also include risk of dystrophy(CRPS), chronic nerve pain, failure of  the healing process to go onto completion and other inherent risks of surgery  The relavent the pathophysiology of the disease/injury process, as well as the alternatives for treatment and postoperative course of action has been discussed in great detail with the patient who desires to proceed.  We will do everything in our power to help you (the patient) restore function to the upper extremity. It is a pleasure to see this patient today.   Willa Frater III, MD 01/12/2020, 1:40 PM

## 2020-01-12 NOTE — Op Note (Signed)
Operative note January 12, 2020  Roseanne Kaufman MD  Preoperative diagnosis comminuted complex greater than 3 part left humerus fracture displaced in nature  Postop diagnosis the same  Operative procedure #1 open reduction internal fixation with a 14 hole 3.5 DCP plate left humeral shaft fracture #2 AP lateral oblique x-rays performed examined and interpreted by myself  Surgeon Roseanne Kaufman  Anesthesia General with preoperative block  Estimated blood loss less than 150 cc  Drains none  Indications this patient is a 69 year old female with a BMI over 40.  She had a significant fall with displaced left humerus fracture.  I have counseled the patient in regards to risk and benefits of surgery and she desires to proceed.  Operative procedure patient was seen by myself and anesthesia taken to the operative theater and underwent smooth induction of general anesthesia she was placed at 40 degrees inclination sequential compression devices were placed and the patient then underwent very careful and cautious prep I performed a Hibiclens prescrub followed by 10-minute surgical Betadine scrub and paint followed by sterile draping.  Timeout was observed and following this the arm was positioned with nail holder and I made a extensile anterolateral approach to the humerus.  Dissection was carried down through subtenons tissue crossing veins were dissected following this I incised the fascia swept the biceps medially and then split the brachialis and the intranervous plane.  Dissection was accomplished proximal to the deltopectoral interval with the cephalic vein vein being carefully protected.  The patient had a very large butterfly fragment which made things very difficult.  I removed bloody clot hematoma and freshen the fracture about the proximal and distal shaft as well as the large butterfly fragment.  Following this a very carefully and cautiously performed provisional reduction followed by placement of a  14 hole 3.5 DCP stainless steel plate from [employer].  I was able to coapt the bone under compression and perform excellent fixation.  I was pleased with this and took multiple x-rays to verify correct position and engagement of the butterfly fragment.  I used 14 screws for the 14 hole plate and was pleased with the fixation.  The patient was placed through a full arc of motion.  I palpated the radial nerve but did not formally dissected as it was not in the plane and the fracture alignment protected the backside of it.  I was very careful and mindful at all points of the median nerve brachial artery and radial nerve in terms of the topographical anatomy.  None of these were dissected however.  The patient was irrigated copiously with Aricept solution followed by closure of brachialis with Vicryl and closure of the skin edge with Prolene followed by a Mepilex dressing and a coaptation and long-arm splint.  Given the patient's adiposity I made sure that she had good stability with the splint prior to the conclusion of the operative intervention.  Going forward she will be spending for IV antibiotics general postop observation and other measures.  We will see her back in the office in 2 weeks for suture removal make her coaptation splint and/long-arm splint and therapy.  We will begin well arm assisted range of motion at approximately 4 to 6 weeks.  All questions have been encouraged and answered pleasure of dissipate in her care today    Avianna Moynahan MD

## 2020-01-12 NOTE — Anesthesia Postprocedure Evaluation (Signed)
Anesthesia Post Note  Patient: Stacie Russell  Procedure(s) Performed: OPEN REDUCTION INTERNAL FIXATION (ORIF) LEFT HUMERUS FRACTURE (Left Arm Upper)     Patient location during evaluation: PACU Anesthesia Type: General and Regional Level of consciousness: awake and alert Pain management: pain level controlled Vital Signs Assessment: post-procedure vital signs reviewed and stable Respiratory status: spontaneous breathing, nonlabored ventilation, respiratory function stable and patient connected to nasal cannula oxygen Cardiovascular status: blood pressure returned to baseline and stable Postop Assessment: no apparent nausea or vomiting Anesthetic complications: no    Last Vitals:  Vitals:   01/12/20 1730 01/12/20 1755  BP:  137/69  Pulse: 78 83  Resp: (!) 22 18  Temp: 36.6 C 36.6 C  SpO2: 95% 97%    Last Pain:  Vitals:   01/12/20 1755  TempSrc: Oral  PainSc:                  [place]

## 2020-01-12 NOTE — Transfer of Care (Signed)
Immediate Anesthesia Transfer of Care Note  Patient: Stacie Russell  Procedure(s) Performed: OPEN REDUCTION INTERNAL FIXATION (ORIF) LEFT HUMERUS FRACTURE (Left Arm Upper)  Patient Location: PACU  Anesthesia Type:General and Regional  Level of Consciousness: awake, alert , oriented and patient cooperative  Airway & Oxygen Therapy: Patient Spontanous Breathing  Post-op Assessment: Report given to RN and Post -op Vital signs reviewed and stable  Post vital signs: Reviewed and stable  Last Vitals:  Vitals Value Taken Time  BP 128/73 01/12/20 1636  Temp    Pulse 50 01/12/20 1636  Resp 30 01/12/20 1636  SpO2 67 % 01/12/20 1636  Vitals shown include unvalidated device data.  Last Pain:  Vitals:   01/12/20 1400  TempSrc:   PainSc: 0-No pain         Complications: No apparent anesthesia complications

## 2020-01-12 NOTE — Anesthesia Procedure Notes (Signed)
Procedure Name: Intubation Date/Time: 01/12/2020 2:15 PM Performed by: Myna Bright, CRNA Pre-anesthesia Checklist: Patient identified, Emergency Drugs available, Suction available and Patient being monitored Patient Re-evaluated:Patient Re-evaluated prior to induction Oxygen Delivery Method: Circle system utilized Preoxygenation: Pre-oxygenation with 100% oxygen Induction Type: IV induction Ventilation: Mask ventilation without difficulty Laryngoscope Size: Mac and 3 Grade View: Grade I Tube type: Oral Tube size: 7.0 mm Number of attempts: 1 Airway Equipment and Method: Stylet Placement Confirmation: ETT inserted through vocal cords under direct vision,  positive ETCO2 and breath sounds checked- equal and bilateral Secured at: 21 cm Tube secured with: Tape Dental Injury: Teeth and Oropharynx as per pre-operative assessment

## 2020-01-13 ENCOUNTER — Encounter: Payer: Self-pay | Admitting: *Deleted

## 2020-01-13 NOTE — Evaluation (Signed)
Occupational Therapy Evaluation Patient Details Name: Stacie Russell MRN: [id number] DOB: [birth year] Today's Date: 01/13/2020    History of Present Illness Pt is s/p OPEN REDUCTION INTERNAL FIXATION (ORIF) LEFT HUMERUS FRACTURE.   Clinical Impression   Pt admitted with the above diagnoses and presents with below problem list. Pt will benefit from continued acute OT to address the below listed deficits and maximize independence with basic ADLs prior to d/c home. At baseline, pt is independent with ADLs. She lives with her spouse. Pt presents with expected deficits s/p ORIF L humerus. Pt with decreased activity tolerance this session and needing min A with basic transfers with +1 HHA. Did not attempt ambulation this session due to pt appearing ill-feeling and reporting feeling "weak" while in standing. VSS. Pt is currently mod A with UB/LB ADLs.      Follow Up Recommendations  Home health OT;Supervision/Assistance - 24 hour    Equipment Recommendations  None recommended by OT    Recommendations for Other Services PT consult     Precautions / Restrictions Precautions Precautions: Other (comment) Required Braces or Orthoses: Sling(pt with long arm splint) Restrictions Other Position/Activity Restrictions: no WB restrictions in order set but treated as NWB       Mobility Bed Mobility Overal bed mobility: Needs Assistance Bed Mobility: Supine to Sit     Supine to sit: HOB elevated;Min assist     General bed mobility comments: utilized bed pad to assist with bringing L hip to full EOB position. Pt states she plans to sleep in recliner at home.   Transfers Overall transfer level: Needs assistance Equipment used: 1 person hand held assist Transfers: Sit to/from [facility] Sit to Stand: Min assist Stand pivot transfers: Min assist       General transfer comment: assist to steady     Balance Overall balance assessment: Needs assistance Sitting-balance  support: Single extremity supported;Feet supported Sitting balance-Leahy Scale: Fair     Standing balance support: Single extremity supported Standing balance-Leahy Scale: Poor Standing balance comment: external support in static stand, min A for dynamic standing                           ADL either performed or assessed with clinical judgement   ADL Overall ADL's : Needs assistance/impaired Eating/Feeding: Set up;Sitting   Grooming: Moderate assistance;Sitting   Upper Body Bathing: Moderate assistance;Sitting   Lower Body Bathing: Moderate assistance;Sit to/from stand   Upper Body Dressing : Moderate assistance;Sitting   Lower Body Dressing: Moderate assistance;Sit to/from stand   Toilet Transfer: Minimal assistance;Stand-pivot;BSC   Toileting- Clothing Manipulation and Hygiene: Moderate assistance;Sit to/from stand         General ADL Comments: Pt completed bed mobility, sat EOB a few minutes then took pivotal steps to sit up in recliner. min A for transfers and pivotal steps to steady. Pt reporting feeling "weak" and looked a bit lightheaded during and after transfer. VSS.     Vision         Perception     Praxis      Pertinent Vitals/Pain Pain Assessment: Faces Faces Pain Scale: Hurts a little bit Pain Location: neck Pain Descriptors / Indicators: Sore Pain Intervention(s): Monitored during session     Hand Dominance Right   Extremity/Trunk Assessment Upper Extremity Assessment Upper Extremity Assessment: LUE deficits/detail LUE Deficits / Details: s/p OPEN REDUCTION INTERNAL FIXATION (ORIF) LEFT HUMERUS FRACTURE. LUE in long arm splint. Pt reports nerve  block still feels largely in effect. able to wiggle digits a bit. LUE: Unable to fully assess due to immobilization   Lower Extremity Assessment Lower Extremity Assessment: Defer to PT evaluation       Communication Communication Communication: No difficulties   Cognition  Arousal/Alertness: Awake/alert Behavior During Therapy: WFL for tasks assessed/performed Overall Cognitive Status: Within Functional Limits for tasks assessed                                     General Comments       Exercises     Shoulder Instructions      Home Living Family/patient expects to be discharged to:: Private residence Living Arrangements: Spouse/significant other Available Help at Discharge: Family;Available 24 hours/day;Other (Comment)(daughter can help PRN) Type of Home: House Home Access: Stairs to enter [employer] of Steps: 5 Entrance Stairs-Rails: Left Home Layout: One level     Bathroom Shower/Tub: Occupational psychologist: Standard     Home Equipment: Environmental consultant - 2 wheels;Bedside commode;Cane - single point   Additional Comments: can use back door to enter house that only has a threshold step      Prior Functioning/Environment Level of Independence: Independent                 OT Problem List: Decreased strength;Decreased activity tolerance;Impaired balance (sitting and/or standing);Decreased knowledge of use of DME or AE;Decreased knowledge of precautions;Impaired UE functional use;Pain      OT Treatment/Interventions: Self-care/ADL training;DME and/or AE instruction;Therapeutic activities;Patient/family education;Balance training    OT Goals(Current goals can be found in the care plan section) Acute Rehab OT Goals Patient Stated Goal: home, get some help with bathing/dressing OT Goal Formulation: With patient/family Time For Goal Achievement: 01/27/20 Potential to Achieve Goals: Good ADL Goals Pt Will Perform Grooming: with modified independence;sitting Pt Will Perform Upper Body Bathing: with modified independence;sitting Pt Will Perform Lower Body Bathing: sit to/from stand;with supervision Pt Will Perform Upper Body Dressing: with set-up;sitting Pt Will Perform Lower Body Dressing: sit to/from  stand;with supervision Pt Will Transfer to Toilet: with supervision;ambulating Pt Will Perform Toileting - Clothing Manipulation and hygiene: with supervision;sit to/from stand Additional ADL Goal #1: Pt will be independet with edema control strategies for LUE.  OT Frequency: Min 2X/week   Barriers to D/C:    pt asking about [place] aide services to help with bathing/dressing at home.        Co-evaluation              AM-PAC OT "6 Clicks" Daily Activity     Outcome Measure Help from another person eating meals?: None Help from another person taking care of personal grooming?: A Little Help from another person toileting, which includes using toliet, bedpan, or urinal?: A Little Help from another person bathing (including washing, rinsing, drying)?: A Lot Help from another person to put on and taking off regular upper body clothing?: A Little Help from another person to put on and taking off regular lower body clothing?: A Lot 6 Click Score: 17   End of Session Equipment Utilized During Treatment: Gait belt;Other (comment)(sling. +1 HHA) Nurse Communication: Mobility status  Activity Tolerance: Patient limited by fatigue Patient left: in chair;with call bell/phone within reach;with family/visitor present  OT Visit Diagnosis: Unsteadiness on feet (R26.81);Muscle weakness (generalized) (M62.81);Pain                Time: [id number]  OT Time Calculation (min): 35 min Charges:  OT General Charges $OT Visit: 1 Visit OT Evaluation $OT Eval Moderate Complexity: 1 Mod OT Treatments $Self Care/Home Management : 8-22 mins  Tyrone Schimke, OT Acute Rehabilitation Services Pager: [phone] Office: [phone]   Hortencia Pilar 01/13/2020, 12:47 PM

## 2020-01-13 NOTE — TOC Initial Note (Addendum)
Transition of Care [facility]) - Initial/Assessment Note    Patient Details  Name: Stacie Russell MRN: [id number] Date of Birth: 02-28-1951  Transition of Care [facility]) CM/SW Contact:    Marilu Favre, RN Phone Number: 01/13/2020, 3:19 PM  Clinical Narrative:                 Patient from home with husband. PT /OT rec HHPT/OT aide  patient and husband in agreement. Provided Medicare.gov list. Patient  Would like [facility] and is requesting Debbie Dabbs HHPt. Patient stated she will a few days if needed to get on Debbie's schedule. Butch Penny with [facility] accepted HHPT/OT referral accepted and she will request Debbie. Patient aware.  WIll need MD orders and face to face.  Expected Discharge Plan: [place] Barriers to Discharge: Continued Medical Work up   Patient Goals and CMS Choice Patient states their goals for this hospitalization and ongoing recovery are:: to return to home CMS Medicare.gov Compare Post Acute Care list provided to:: Patient Choice offered to / list presented to : Patient, Spouse  Expected Discharge Plan and Services Expected Discharge Plan: [place]   Discharge Planning Services: CM Consult Post Acute Care Choice: [place] arrangements for the past 2 months: Single Family Home                 DME Arranged: N/A DME Agency: NA       HH Arranged: OT, PT [place] Agency: [facility] ([facility]) Date HH Agency Contacted: 01/13/20 Time [place]: 1518 Representative spoke with at [place]: Butch Penny  Prior Living Arrangements/Services Living arrangements for the past 2 months: [place] Lives with:: Spouse Patient language and need for interpreter reviewed:: Yes Do you feel safe going back to the place where you live?: Yes      Need for Family Participation in Patient Care: Yes (Comment) Care giver support system in place?: Yes (comment)   Criminal Activity/Legal Involvement Pertinent  to Current Situation/Hospitalization: No - Comment as needed  Activities of Daily Living Home Assistive Devices/Equipment: Eyeglasses, Blood pressure cuff, Grab bars in shower ADL Screening (condition at time of admission) Patient's cognitive ability adequate to safely complete daily activities?: Yes Is the patient deaf or have difficulty hearing?: No Does the patient have difficulty seeing, even when wearing glasses/contacts?: No Does the patient have difficulty concentrating, remembering, or making decisions?: No Patient able to express need for assistance with ADLs?: Yes Does the patient have difficulty dressing or bathing?: No Independently performs ADLs?: Yes (appropriate for developmental age) Does the patient have difficulty walking or climbing stairs?: No Weakness of Legs: None Weakness of Arms/Hands: None  Permission Sought/Granted   Permission granted to share information with : Yes, Verbal Permission Granted  Share Information with NAME: Husband           Emotional Assessment Appearance:: Appears stated age Attitude/Demeanor/Rapport: Engaged Affect (typically observed): Accepting Orientation: : Oriented to Self, Oriented to Place, Oriented to  Time, Oriented to Situation Alcohol / Substance Use: Not Applicable Psych Involvement: No (comment)  Admission diagnosis:  Closed displaced comminuted fracture of shaft of left humerus [S42.352A] Patient Active Problem List   Diagnosis Date Noted   Closed displaced comminuted fracture of shaft of left humerus 01/12/2020   Synovitis of left ankle 09/24/2019   Trigger thumb, right thumb 09/24/2019   OA (osteoarthritis) of knee 10/07/2017   Severe obstructive sleep apnea 08/27/2017   Oxygen deficiency  Hypertension    Spinal stenosis of lumbar region 03/08/2017   Fatigue 02/15/2017   Palpitations 01/16/2017   Fundic gland polyps of stomach, benign 07/10/2015   Family history of colon cancer in mother - dx 40's  07/04/2015   Overactive bladder 12/29/2014   Chest pain [id number]   Metabolic syndrome [id number]   Morbid obesity due to excess calories ([place]) 05/07/2013   Essential hypertension, benign 11/03/2012   Vitamin D deficiency 11/03/2012   Knee pain    Endometrial hyperplasia    Hemorrhoid    Foot pain    Esophageal stricture    Diverticulosis    Hip pain    Gastritis    Tendonitis of ankle    Hiatal hernia    Mixed hyperlipidemia    Asthma    GERD 10/03/2009   PCP:  Janora Norlander, DO Pharmacy:   [facility], [place], [place] - [address] [place] [address] Phone: [phone] Fax: [phone]  [facility] [address] [place] [address] [place] [address] Phone: [phone] Fax: [phone]  [place], [place] [address] [place] [address] Phone: [phone] Fax: [place], [place] [address] [place] [address] Phone: [phone] Fax: [phone]     Social Determinants of Health (SDOH) Interventions    Readmission Risk Interventions No flowsheet data found.

## 2020-01-13 NOTE — Evaluation (Signed)
Physical Therapy Evaluation Patient Details Name: Stacie Russell MRN: [id number] DOB: [birth year] Today's Date: 01/13/2020   History of Present Illness  Pt is a 69 y.o. F with significant PMH of right TKA, asthma who presents after a fall wtih comminuted complex greater than 3 part left humerus fracture displaced in nature. S/p ORIF 01/12/2020.   Clinical Impression  Prior to admission, pt lives with her husband and is independent. Currently presents with decreased functional use of LUE, balance deficits, and decreased endurance. Ambulating 30 feet with handheld assist; further distances limited by fatigue. Education provided regarding sling use, elevation, mobility progression, and recommendation of use of cane (particularly with unlevel surfaces). Would benefit from HHPT follow up to address deficits and maximize functional independence.     Follow Up Recommendations Home health PT;Supervision for mobility/OOB    Equipment Recommendations  None recommended by PT    Recommendations for Other Services       Precautions / Restrictions Precautions Precautions: Other (comment) Required Braces or Orthoses: Sling(pt with long arm splint) Restrictions Weight Bearing Restrictions: Yes LUE Weight Bearing: Non weight bearing Other Position/Activity Restrictions: no WB restrictions in order set but treated as NWB       Mobility  Bed Mobility Overal bed mobility: Needs Assistance Bed Mobility: Supine to Sit     Supine to sit: HOB elevated;Min assist     General bed mobility comments: OOB in chair  Transfers Overall transfer level: Needs assistance Equipment used: 1 person hand held assist Transfers: Sit to/from Stand Sit to Stand: Min guard Stand pivot transfers: Min assist       General transfer comment: assist to steady   Ambulation/Gait Ambulation/Gait assistance: Min guard Gait Distance (Feet): 30 Feet Assistive device: 1 person hand held assist Gait  Pattern/deviations: Step-through pattern;Decreased stride length     General Gait Details: Slow, cautious gait with mild dynamic instability. No overt LOB  Stairs            Wheelchair Mobility    Modified Rankin (Stroke Patients Only)       Balance Overall balance assessment: Needs assistance Sitting-balance support: Single extremity supported;Feet supported Sitting balance-Leahy Scale: Fair     Standing balance support: No upper extremity supported;During functional activity Standing balance-Leahy Scale: Fair Standing balance comment: external support in static stand, min A for dynamic standing                             Pertinent Vitals/Pain Pain Assessment: Faces Faces Pain Scale: Hurts a little bit Pain Location: neck Pain Descriptors / Indicators: Sore Pain Intervention(s): Monitored during session    Home Living Family/patient expects to be discharged to:: Private residence Living Arrangements: Spouse/significant other Available Help at Discharge: Family;Available 24 hours/day;Other (Comment)(daughter can help PRN) Type of Home: House Home Access: Stairs to enter Entrance Stairs-Rails: Left Entrance Stairs-Number of Steps: 5 Home Layout: One level Home Equipment: Walker - 2 wheels;Bedside commode;Cane - single point Additional Comments: can use back door to enter house that only has a threshold step    Prior Function Level of Independence: Independent               Hand Dominance   Dominant Hand: Right    Extremity/Trunk Assessment   Upper Extremity Assessment Upper Extremity Assessment: LUE deficits/detail LUE Deficits / Details: In long arm splint. Able to flex/extend digits LUE: Unable to fully assess due to immobilization    Lower Extremity Assessment  Lower Extremity Assessment: RLE deficits/detail;LLE deficits/detail RLE Deficits / Details: Strength 5/5 LLE Deficits / Details: Strength 5/5       Communication    Communication: No difficulties  Cognition Arousal/Alertness: Awake/alert Behavior During Therapy: WFL for tasks assessed/performed Overall Cognitive Status: Within Functional Limits for tasks assessed                                        General Comments      Exercises     Assessment/Plan    PT Assessment Patient needs continued PT services  PT Problem List Decreased strength;Decreased range of motion;Decreased activity tolerance;Decreased balance;Decreased mobility;Pain       PT Treatment Interventions DME instruction;Gait training;Stair training;Functional mobility training;Therapeutic activities;Therapeutic exercise;Balance training;Patient/family education    PT Goals (Current goals can be found in the Care Plan section)  Acute Rehab PT Goals Patient Stated Goal: home, get some help with bathing/dressing PT Goal Formulation: With patient Time For Goal Achievement: 01/27/20 Potential to Achieve Goals: Good    Frequency Min 5X/week   Barriers to discharge        Co-evaluation               AM-PAC PT "6 Clicks" Mobility  Outcome Measure Help needed turning from your back to your side while in a flat bed without using bedrails?: A Little Help needed moving from lying on your back to sitting on the side of a flat bed without using bedrails?: A Little Help needed moving to and from a bed to a chair (including a wheelchair)?: A Little Help needed standing up from a chair using your arms (e.g., wheelchair or bedside chair)?: A Little Help needed to walk in hospital room?: A Little Help needed climbing 3-5 steps with a railing? : A Little 6 Click Score: 18    End of Session Equipment Utilized During Treatment: Other (comment)(sling) Activity Tolerance: Patient tolerated treatment well Patient left: in chair;with call bell/phone within reach;with family/visitor present Nurse Communication: Mobility status PT Visit Diagnosis: Unsteadiness on feet  (R26.81);History of falling (Z91.81);Pain Pain - Right/Left: Left Pain - part of body: Shoulder    Time: [id number] PT Time Calculation (min) (ACUTE ONLY): 16 min   Charges:   PT Evaluation $PT Eval Moderate Complexity: 1 Mod            Wyona Almas, PT, DPT Acute Rehabilitation Services Pager [phone] Office [phone]   Deno Etienne 01/13/2020, 2:29 PM

## 2020-01-13 NOTE — Progress Notes (Signed)
Patient ID: Stacie Russell, female   DOB: 04/08/1951, 69 y.o.   MRN: [id number] Stable POD 1 VSS AF Patient is stable.  She is tolerating her diet.  She has no signs of DVT or lower extremity abnormality.  I instructed her on range of motion and straight leg raises as well as ankle pumps.  We will continue sequential compression devices and other measures.  She is noted to have her block wear off nicely.  I am pleased to report she has soft compartments and her radial median and ulnar nerve function is intact.  I have instructed her on very careful cautious use of her arm and want to reiterate that she is absolutely nonweightbearing with her left upper extremity.  Should she have any problems she will let me know.  I will see her tomorrow and expect discharge to home Friday morning.  I have ordered home health and discussed with the patient the necessary precautions.  I would hope that she can transition to better independence out of bed tomorrow.  I went through the surgery and the planned postoperative aftercare for her.  This was a quite extensive surgery for a very significant abnormality.  Hopefully we will see good improvement in the days to come.  I discussed with her the postop plan of care and follow-up needs and other issues.  All questions have been addressed.  Overall, I do feel she is looking quite well and we simply need to stay the course  Stacie Claflin MD

## 2020-01-14 MED ORDER — OXYCODONE HCL 5 MG PO TABS: 5.0000 mg | ORAL_TABLET | ORAL | 0 refills | Status: DC | PRN

## 2020-01-14 MED ORDER — METHOCARBAMOL 500 MG PO TABS: 500.0000 mg | ORAL_TABLET | Freq: Four times a day (QID) | ORAL | 0 refills | Status: DC | PRN

## 2020-01-14 NOTE — Discharge Instructions (Signed)

## 2020-01-14 NOTE — Care Management (Signed)
Consult for : PT and home health for assistance to independent living transition  See note from 01/13/20. Patient has home health arranged with [facility].    Magdalen Spatz RN

## 2020-01-14 NOTE — Progress Notes (Signed)
Physical Therapy Treatment Patient Details Name: Stacie Russell MRN: [id number] DOB: 07-07-1951 Today's Date: 01/14/2020    History of Present Illness Pt is a 69 y.o. F with significant PMH of right TKA, asthma who presents after a fall wtih comminuted complex greater than 3 part left humerus fracture displaced in nature. S/p ORIF 01/12/2020.     PT Comments    Patient is progressing very well towards their physical therapy goals. Reports moderate pain in left shoulder. Ambulating 400 feet with no assistive device at a supervision level. Education provided regarding fall prevention techniques at home I.e. removing throw rugs, walking with shoes or grip socks, turning a light on when walking at night. Pt/pt husband verbalized understanding.    Follow Up Recommendations  Home health PT;Supervision for mobility/OOB     Equipment Recommendations  None recommended by PT    Recommendations for Other Services       Precautions / Restrictions Precautions Precautions: Other (comment) Required Braces or Orthoses: Sling(pt with long arm splint) Restrictions Weight Bearing Restrictions: Yes LUE Weight Bearing: Non weight bearing Other Position/Activity Restrictions: no AROM of L shoulder per Dr. Amedeo Plenty    Mobility  Bed Mobility Overal bed mobility: Needs Assistance Bed Mobility: Supine to Sit;Sit to Supine     Supine to sit: Min guard Sit to supine: Min assist   General bed mobility comments: Min guard for safety with transition to upright, guarding LUE. MinA for LE negotiation back into bed  Transfers Overall transfer level: Needs assistance Equipment used: None Transfers: Sit to/from Stand Sit to Stand: Min guard            Ambulation/Gait Ambulation/Gait assistance: Supervision Gait Distance (Feet): 400 Feet Assistive device: None Gait Pattern/deviations: Step-through pattern;Decreased stride length Gait velocity: decreased   General Gait Details: Slow, cautious  gait. No gross imbalance. Use of RUE to further stabilize LUE   Stairs             Wheelchair Mobility    Modified Rankin (Stroke Patients Only)       Balance Overall balance assessment: Needs assistance Sitting-balance support: Feet supported Sitting balance-Leahy Scale: Good     Standing balance support: No upper extremity supported;During functional activity Standing balance-Leahy Scale: Good                              Cognition Arousal/Alertness: Awake/alert Behavior During Therapy: WFL for tasks assessed/performed Overall Cognitive Status: Within Functional Limits for tasks assessed                                        Exercises      General Comments        Pertinent Vitals/Pain Pain Assessment: Faces Faces Pain Scale: Hurts little more Pain Location: neck, shoulder Pain Descriptors / Indicators: Other (Comment)(pulling) Pain Intervention(s): Limited activity within patient's tolerance;Monitored during session;Repositioned    Home Living                      Prior Function            PT Goals (current goals can now be found in the care plan section) Acute Rehab PT Goals Patient Stated Goal: home, get some help with bathing/dressing Potential to Achieve Goals: Good Progress towards PT goals: Progressing toward goals    Frequency  Min 5X/week      PT Plan Current plan remains appropriate    Co-evaluation              AM-PAC PT "6 Clicks" Mobility   Outcome Measure  Help needed turning from your back to your side while in a flat bed without using bedrails?: None Help needed moving from lying on your back to sitting on the side of a flat bed without using bedrails?: A Little Help needed moving to and from a bed to a chair (including a wheelchair)?: A Little Help needed standing up from a chair using your arms (e.g., wheelchair or bedside chair)?: A Little Help needed to walk in hospital  room?: None Help needed climbing 3-5 steps with a railing? : A Little 6 Click Score: 20    End of Session Equipment Utilized During Treatment: Other (comment)(sling) Activity Tolerance: Patient tolerated treatment well Patient left: in bed;with call bell/phone within reach;with family/visitor present   PT Visit Diagnosis: Unsteadiness on feet (R26.81);History of falling (Z91.81);Pain Pain - Right/Left: Left Pain - part of body: Shoulder     Time: [id number] PT Time Calculation (min) (ACUTE ONLY): 22 min  Charges:  $Therapeutic Activity: 8-22 mins                       Wyona Almas, PT, DPT Acute Rehabilitation Services Pager [phone] Office [phone]    Deno Etienne 01/14/2020, 4:58 PM

## 2020-01-14 NOTE — Plan of Care (Signed)
  Problem: Education: Goal: Knowledge of General Education information will improve Description Including pain rating scale, medication(s)/side effects and non-pharmacologic comfort measures Outcome: Progressing   

## 2020-01-14 NOTE — Progress Notes (Signed)
Patient ID: Stacie Russell, female   DOB: 1950-08-30, 69 y.o.   MRN: [id number] Patient is seen and examined at bedside patient has stable examination.  She is walking the hallway with assistance.  I reviewed her therapy notes.  We will plan to transition to home tomorrow and I will order home therapy including physical and occupational therapy.  I discussed these issues with patient at length and the relevant findings.  She is tolerating her diet.  No signs of DVT infection or dystrophy.  All questions have been addressed.  Menaal Russum MD

## 2020-01-14 NOTE — Progress Notes (Signed)
Occupational Therapy Treatment Patient Details Name: Stacie Russell MRN: [id number] DOB: Sep 25, 1950 Today's Date: 01/14/2020    History of present illness Pt is a 69 y.o. F with significant PMH of right TKA, asthma who presents after a fall wtih comminuted complex greater than 3 part left humerus fracture displaced in nature. S/p ORIF 01/12/2020.    OT comments  Pt progressing towards acute OT goals. Pt reports she has gone for a few short walks with PT and nursing utilizing a cane. Focus of session was UB ADL strategies and techniques, sling management. Also continued to encourage elevation onto a pillow and AROM R digits for edema control. D/c plan remains appropriate.    Follow Up Recommendations  Home health OT;Supervision/Assistance - 24 hour    Equipment Recommendations  None recommended by OT    Recommendations for Other Services      Precautions / Restrictions Precautions Precautions: Other (comment) Required Braces or Orthoses: Sling(pt with long arm splint) Restrictions Weight Bearing Restrictions: Yes LUE Weight Bearing: Non weight bearing Other Position/Activity Restrictions: no AROM of L shoulder per Dr. Amedeo Plenty       Mobility Bed Mobility               General bed mobility comments: OOB in chair  Transfers                      Balance                                           ADL either performed or assessed with clinical judgement   ADL                                         General ADL Comments: Reviewed UB ADL strategies and pt practiced managing velcro on sling and positioning strap.      Vision       Perception     Praxis      Cognition Arousal/Alertness: Awake/alert Behavior During Therapy: WFL for tasks assessed/performed Overall Cognitive Status: Within Functional Limits for tasks assessed                                          Exercises     Shoulder  Instructions       General Comments      Pertinent Vitals/ Pain       Pain Assessment: Faces Faces Pain Scale: Hurts a little bit Pain Location: neck Pain Descriptors / Indicators: Sore Pain Intervention(s): Monitored during session;Repositioned  Home Living                                          Prior Functioning/Environment              Frequency  Min 2X/week        Progress Toward Goals  OT Goals(current goals can now be found in the care plan section)  Progress towards OT goals: Progressing toward goals  Acute Rehab OT Goals Patient Stated Goal: home, get some help with bathing/dressing OT Goal Formulation:  With patient/family Time For Goal Achievement: 01/27/20 Potential to Achieve Goals: Good ADL Goals Pt Will Perform Grooming: with modified independence;sitting Pt Will Perform Upper Body Bathing: with modified independence;sitting Pt Will Perform Lower Body Bathing: sit to/from stand;with supervision Pt Will Perform Upper Body Dressing: with set-up;sitting Pt Will Perform Lower Body Dressing: sit to/from stand;with supervision Pt Will Transfer to Toilet: with supervision;ambulating Pt Will Perform Toileting - Clothing Manipulation and hygiene: with supervision;sit to/from stand Additional ADL Goal #1: Pt will be independet with edema control strategies for LUE.  Plan Discharge plan remains appropriate    Co-evaluation                 AM-PAC OT "6 Clicks" Daily Activity     Outcome Measure   Help from another person eating meals?: None Help from another person taking care of personal grooming?: A Little Help from another person toileting, which includes using toliet, bedpan, or urinal?: A Little Help from another person bathing (including washing, rinsing, drying)?: A Lot Help from another person to put on and taking off regular upper body clothing?: A Little Help from another person to put on and taking off regular lower  body clothing?: A Little 6 Click Score: 18    End of Session Equipment Utilized During Treatment: Other (comment)(sling)  OT Visit Diagnosis: Unsteadiness on feet (R26.81);Muscle weakness (generalized) (M62.81);Pain   Activity Tolerance Patient tolerated treatment well   Patient Left in chair;with call bell/phone within reach   Nurse Communication          Time: [phone] OT Time Calculation (min): 28 min  Charges: OT General Charges $OT Visit: 1 Visit OT Treatments $Self Care/Home Management : 23-37 mins  Tyrone Schimke, OT Acute Rehabilitation Services Pager: [phone] Office: [phone]    Hortencia Pilar 01/14/2020, 1:55 PM

## 2020-01-15 NOTE — Progress Notes (Signed)
Patient ID: Stacie Russell, female   DOB: 11/09/1950, 69 y.o.   MRN: [id number] I discussed with patient all issues.  I will plan for discharge today.  We discussed all issues.  She request a pure wick device for nighttime use given the fact that she has lots of urine spillage during the night.  I have no problems with this.  She will notify me as to where I can fax a prescription to regarding the pure wick device and she may use this at night however during the day I would like her to be active and get up out of bed and restore her motion.  She is stable at the time of discharge.  Final diagnosis status post open reduction internal fixation left humerus fracture with 14 hole plate.  We will see her back in 14 days.  All questions have been encouraged and answered.  Tyrae Alcoser MD

## 2020-01-15 NOTE — Progress Notes (Signed)
[name] to be D/C'd  per MD order. Discussed with the patient and all questions fully answered.  VSS, Skin clean, dry and intact without evidence of skin break down, no evidence of skin tears noted.  IV catheter discontinued intact. Site without signs and symptoms of complications. Dressing and pressure applied.  An After Visit Summary was printed and given to the patient.  D/c education completed with patient/family including follow up instructions, medication list, d/c activities limitations if indicated, with other d/c instructions as indicated by MD - patient able to verbalize understanding, all questions fully answered.   Patient instructed to return to ED, call 911, or call MD for any changes in condition.   Patient to be escorted via [facility], and D/C home via private auto.

## 2020-01-15 NOTE — Progress Notes (Signed)
Occupational Therapy Treatment Patient Details Name: Stacie Russell MRN: [id number] DOB: April 30, 1951 Today's Date: 01/15/2020    History of present illness Pt is a 69 y.o. F with significant PMH of right TKA, asthma who presents after a fall wtih comminuted complex greater than 3 part left humerus fracture displaced in nature. S/p ORIF 01/12/2020.    OT comments  Pt discharging home with husband this afternoon, HHOT order placed. Today, pt was Mod I for bed mobility, utilizing bed railings for support. Pt requires supervision for functional transfers without use of mobility device. Pt practiced UB and LB dressing with mod assist for threading clothing articles in sitting/standing positions. Pt able to perform perineal care independently sitting on standard toilet. Educated pt/husband on proper management of sling, husband practiced donning/doffing sling while pt seated EOB. Discussed fall prevention techniques. Pt/husband confirmed having all necessary AE/DME at home. Instructed pt to ask physician about clearance for driving and showering. Pt would benefit from continued skilled [facility] services to address ADLs, functional mobility, and BUE HEP when applicable (after NWB status is lifted for LUE).    Follow Up Recommendations  Home health OT;Supervision/Assistance - 24 hour    Equipment Recommendations  None recommended by OT    Recommendations for Other Services      Precautions / Restrictions Precautions Precautions: Other (comment) Required Braces or Orthoses: Sling(LUE) Restrictions Weight Bearing Restrictions: Yes LUE Weight Bearing: Non weight bearing       Mobility Bed Mobility Overal bed mobility: Modified Independent Bed Mobility: Supine to Sit     Supine to sit: Supervision;HOB elevated Sit to supine: (pt left sitting EOB with husband present and nurse coming in)   General bed mobility comments: mod I using bed railing for assistance and extended  time  Transfers Overall transfer level: Needs assistance Equipment used: None Transfers: Sit to/from Stand Sit to Stand: Supervision         General transfer comment: supervison for safety    Balance Overall balance assessment: Needs assistance Sitting-balance support: Feet supported Sitting balance-Leahy Scale: Good     Standing balance support: No upper extremity supported;During functional activity Standing balance-Leahy Scale: Good Standing balance comment: supervision for static/dynamic standing; no LOB                           ADL either performed or assessed with clinical judgement   ADL Overall ADL's : Needs assistance/impaired     Grooming: Modified independent;Sitting Grooming Details (indicate cue type and reason): brushed hair while sitting EOB         Upper Body Dressing : Moderate assistance;Sitting Upper Body Dressing Details (indicate cue type and reason): pt required mod assist to don/doff shirt and sling while dressing Lower Body Dressing: Moderate assistance;Sit to/from stand Lower Body Dressing Details (indicate cue type and reason): required mod assist to thread BLEs through underwear/shorts and fully pull over hips in standing Toilet Transfer: Supervision/safety;Ambulation   Toileting- Clothing Manipulation and Hygiene: Supervision/safety;Sitting/lateral lean       Functional mobility during ADLs: Supervision/safety;Cueing for safety General ADL Comments: Reviewed hemi dressing techniques and properly donning/doffing sling; practiced toilet transfer; educated pt on potential use of reacher for LB dressing      Vision       Perception     Praxis      Cognition Arousal/Alertness: Awake/alert Behavior During Therapy: WFL for tasks assessed/performed Overall Cognitive Status: Within Functional Limits for tasks assessed  General Comments: Ox4; ready to return home with husband         Exercises     Shoulder Instructions       General Comments pt wanting purewick for night use at home- pt spoke with physican about request, pt following up with nurse before discharge    Pertinent Vitals/ Pain       Pain Assessment: No/denies pain Pain Score: 0-No pain  Home Living                                          Prior Functioning/Environment              Frequency  Min 2X/week        Progress Toward Goals  OT Goals(current goals can now be found in the care plan section)  Progress towards OT goals: Progressing toward goals  Acute Rehab OT Goals Patient Stated Goal: home, get some help with bathing/dressing OT Goal Formulation: With patient/family ADL Goals Pt Will Perform Grooming: with modified independence;sitting Pt Will Perform Upper Body Bathing: with modified independence;sitting Pt Will Perform Lower Body Bathing: sit to/from stand;with supervision Pt Will Perform Upper Body Dressing: with set-up;sitting Pt Will Perform Lower Body Dressing: sit to/from stand;with supervision Pt Will Transfer to Toilet: with supervision;ambulating Pt Will Perform Toileting - Clothing Manipulation and hygiene: with supervision;sit to/from stand Additional ADL Goal #1: Pt will be independet with edema control strategies for LUE.  Plan Discharge plan remains appropriate    Co-evaluation                 AM-PAC OT "6 Clicks" Daily Activity     Outcome Measure   Help from another person eating meals?: None Help from another person taking care of personal grooming?: None Help from another person toileting, which includes using toliet, bedpan, or urinal?: A Little Help from another person bathing (including washing, rinsing, drying)?: A Lot Help from another person to put on and taking off regular upper body clothing?: A Little Help from another person to put on and taking off regular lower body clothing?: A Little 6 Click Score: 19     End of Session Equipment Utilized During Treatment: Gait belt;Other (comment)(sling)  OT Visit Diagnosis: Unsteadiness on feet (R26.81);Muscle weakness (generalized) (M62.81)   Activity Tolerance Patient tolerated treatment well   Patient Left in bed;with call bell/phone within reach;with family/visitor present   Nurse Communication Mobility status        Time: 1017-5102 OT Time Calculation (min): 32 min  Charges: OT General Charges $OT Visit: 1 Visit OT Treatments $Self Care/Home Management : 23-37 mins  Michel Bickers, OTR/L [facility] [place] 01/15/2020, 4:36 PM

## 2020-01-15 NOTE — Plan of Care (Signed)
Problem: Education: °Goal: Knowledge of General Education information will improve °Description: Including pain rating scale, medication(s)/side effects and non-pharmacologic comfort measures °Outcome: Completed/Met °  °

## 2020-01-15 NOTE — Care Management Important Message (Signed)
Important Message  Patient Details  Name: Stacie Russell MRN: [id number] Date of Birth: June 05, 1951   Medicare Important Message Given:  Yes     Orbie Pyo 01/15/2020, 11:44 AM

## 2020-01-15 NOTE — Discharge Summary (Signed)
Physician Discharge Summary  Patient ID: DENEICE WACK MRN: [id number] DOB/AGE: 71-Oct-1952 71 y.o.  Admit date: 01/12/2020 Discharge date:   Admission Diagnoses: Comminuted left humerus fracture Past Medical History:  Diagnosis Date  . Arthritis   . Asthma   . Diverticulosis   . Endometrial hyperplasia   . Esophageal stricture   . Foot pain   . Fundic gland polyps of stomach, benign 07/10/2015  . Gastritis   . GERD (gastroesophageal reflux disease)   . Hemorrhoid   . Hiatal hernia   . Hip pain   . History of kidney stones    cystoscopy  . Hyperlipidemia   . Hypertension   . Hypothyroidism   . Knee pain   . Metabolic syndrome   . Obstructive sleep apnea 08/27/2017  . Oxygen deficiency    pt denies  . PONV (postoperative nausea and vomiting)   . Tendonitis of ankle   . Vitamin D deficiency     Discharge Diagnoses:  Active Problems:   Closed displaced comminuted fracture of shaft of left humerus   Surgeries: Procedure(s): OPEN REDUCTION INTERNAL FIXATION (ORIF) LEFT HUMERUS FRACTURE on 01/12/2020    Consultants:   Discharged Condition: Improved  Hospital Course: TEJASVI BRISSETT is an 69 y.o. female who was admitted 01/12/2020 with a chief complaint of No chief complaint on file. , and found to have a diagnosis of Comminuted left humerus fracture.  They were brought to the operating room on 01/12/2020 and underwent Procedure(s): OPEN REDUCTION INTERNAL FIXATION (ORIF) LEFT HUMERUS FRACTURE.    They were given perioperative antibiotics:  Anti-infectives (From admission, onward)   Start     Dose/Rate Route Frequency Ordered Stop   01/12/20 2200  ceFAZolin (ANCEF) IVPB 1 g/50 mL premix     1 g 100 mL/hr over 30 Minutes Intravenous Every 8 hours 01/12/20 1803     01/12/20 1815  ceFAZolin (ANCEF) IVPB 1 g/50 mL premix     1 g 100 mL/hr over 30 Minutes Intravenous NOW 01/12/20 1803 01/13/20 1815    .  They were given sequential compression devices, early  ambulation, and   Recent vital signs:  Patient Vitals for the past 24 hrs:  BP Temp Temp src Pulse Resp SpO2  01/15/20 0448 140/71 98.3 F (36.8 C) Oral 81 17 95 %  01/14/20 2036 135/62 98.9 F (37.2 C) Oral 61 16 95 %  01/14/20 1544 (!) 102/47 98 F (36.7 C) Oral 87 16 99 %  .  Recent laboratory studies: No results found.  Discharge Medications:   Allergies as of 01/15/2020   No Known Allergies     Medication List    TAKE these medications   acetaminophen 500 MG tablet Commonly known as: TYLENOL Take 500 mg by mouth every 6 (six) hours as needed for moderate pain.   albuterol 108 (90 Base) MCG/ACT inhaler Commonly known as: VENTOLIN HFA Inhale 2 puffs into the lungs every 6 (six) hours as needed for wheezing or shortness of breath.   aspirin EC 81 MG tablet Take 81 mg by mouth daily.   budesonide-formoterol 80-4.5 MCG/ACT inhaler Commonly known as: SYMBICORT Inhale 2 puffs into the lungs 2 (two) times daily. What changed:   when to take this  reasons to take this   calcium carbonate 1500 (600 Ca) MG Tabs tablet Commonly known as: OSCAL Take 600 mg of elemental calcium by mouth 2 (two) times daily with a meal. 600 mg in am and 900 mg at night  calcium carbonate 500 MG chewable tablet Commonly known as: TUMS - dosed in mg elemental calcium Chew 1 tablet by mouth daily.   docusate sodium 100 MG capsule Commonly known as: COLACE Take 100 mg by mouth daily.   DULoxetine 30 MG capsule Commonly known as: CYMBALTA TAKE 1 CAPSULE BY MOUTH  DAILY   Fish Oil 1000 MG Caps Take 1 capsule by mouth every evening.   furosemide 20 MG tablet Commonly known as: LASIX TAKE 1 TABLET BY MOUTH  DAILY AS NEEDED What changed: reasons to take this   levothyroxine 50 MCG tablet Commonly known as: SYNTHROID TAKE 1 TABLET BY MOUTH  DAILY BEFORE BREAKFAST   losartan-hydrochlorothiazide 100-12.5 MG tablet Commonly known as: HYZAAR TAKE 1 TABLET BY MOUTH  DAILY    methocarbamol 500 MG tablet Commonly known as: ROBAXIN Take 500 mg by mouth every 6 (six) hours as needed for muscle spasms. What changed: Another medication with the same name was added. Make sure you understand how and when to take each.   methocarbamol 500 MG tablet Commonly known as: Robaxin Take 1 tablet (500 mg total) by mouth every 6 (six) hours as needed for muscle spasms. What changed: You were already taking a medication with the same name, and this prescription was added. Make sure you understand how and when to take each.   oxybutynin 5 MG tablet Commonly known as: DITROPAN TAKE 2 TABLETS BY MOUTH  TWICE DAILY What changed:   how to take this  when to take this   oxyCODONE 5 MG immediate release tablet Commonly known as: Roxicodone Take 1 tablet (5 mg total) by mouth every 4 (four) hours as needed.   oxyCODONE-acetaminophen 5-325 MG tablet Commonly known as: PERCOCET/ROXICET Take 1 tablet by mouth every 4 (four) hours as needed for severe pain.   pantoprazole 40 MG tablet Commonly known as: PROTONIX TAKE 1 TABLET BY MOUTH ONCE DAILY BEFORE BREAKFAST   Vitamin D3 50 MCG (2000 UT) Tabs Take 1 tablet by mouth daily.       Diagnostic Studies: DG Humerus Left  Result Date: 01/12/2020 CLINICAL DATA:  69 year old female with ORIF of the left humerus. EXAM: DG C-ARM COMPARISON:  Left humeral radiograph dated 12/31/2019. FINDINGS: Five intraoperative fluoroscopic spot images provided. The total fluoroscopic time is 26 seconds. Left humeral fixation sideplate and screws noted. IMPRESSION: ORIF of the left humerus. Electronically Signed   By: Anner Crete M.D.   On: 01/12/2020 18:03   DG Humerus Left  Result Date: 12/31/2019 CLINICAL DATA:  69 year old female status post fall at home with pain. EXAM: LEFT HUMERUS - 2+ VIEW COMPARISON:  Chest radiograph 01/14/2017. FINDINGS: Comminuted spiral fracture of the midshaft left humerus. 1 full shaft width lateral  displacement of the fracture fragment with estimated 1-2 cm of over riding. The proximal and distal humerus metadiaphysis appear to remain intact. Glenohumeral joint alignment appears grossly normal. No other No acute osseous abnormality identified. IMPRESSION: Elongated spiral fracture of the left humerus midshaft with comminution, 1 full shaft width lateral displacement, and over riding of fragments by 1-2 cm. Electronically Signed   By: Genevie Ann M.D.   On: 12/31/2019 18:49   DG C-Arm 1-60 Min  Result Date: 01/12/2020 CLINICAL DATA:  69 year old female with ORIF of the left humerus. EXAM: DG C-ARM COMPARISON:  Left humeral radiograph dated 12/31/2019. FINDINGS: Five intraoperative fluoroscopic spot images provided. The total fluoroscopic time is 26 seconds. Left humeral fixation sideplate and screws noted. IMPRESSION: ORIF of the left  humerus. Electronically Signed   By: Anner Crete M.D.   On: 01/12/2020 18:03    They benefited maximally from their hospital stay and there were no complications.     Disposition: Discharge disposition: 01-Home or Self Care      Discharge Instructions    Call MD / Call 911   Complete by: As directed    If you experience chest pain or shortness of breath, CALL 911 and be transported to the hospital emergency room.  If you develope a fever above 101 F, pus (white drainage) or increased drainage or redness at the wound, or calf pain, call your surgeon's office.   Constipation Prevention   Complete by: As directed    Drink plenty of fluids.  Prune juice may be helpful.  You may use a stool softener, such as Colace (over the counter) 100 mg twice a day.  Use MiraLax (over the counter) for constipation as needed.   Diet - low sodium heart healthy   Complete by: As directed    Face-to-face encounter (required for Medicare/Medicaid patients)   Complete by: As directed    I Willa Frater III certify that this patient is under my care and that I, or a nurse  practitioner or physician's assistant working with me, had a face-to-face encounter that meets the physician face-to-face encounter requirements with this patient on 01/14/2020. The encounter with the patient was in whole, or in part for the following medical condition(s) which is the primary reason for home health care (List medical condition): Patient Active Problem List   Closed displaced comminuted fracture of shaft of left humerus        Date Noted: 01/12/2020    Synovitis of left ankle        Date Noted: 09/24/2019    Trigger thumb, right thumb        Date Noted: 09/24/2019    OA (osteoarthritis) of knee        Date Noted: 10/07/2017    Severe obstructive sleep apnea        Date Noted: 08/27/2017    Oxygen deficiency    Hypertension    Spinal stenosis of lumbar region        Date Noted: 03/08/2017    Fatigue        Date Noted: 02/15/2017    Palpitations        Date Noted: 01/16/2017    Fundic gland polyps of stomach, benign        Date Noted: 07/10/2015    Family history of colon cancer in mother - dx 61's        Date Noted: 07/04/2015    Overactive bladder        Date Noted: 12/29/2014    Chest pain        Date Noted: 58/52/7782    Metabolic syndrome        Date Noted: 08/12/2013    Morbid obesity due to excess calories ([facility])        Date Noted: 05/07/2013    Essential hypertension, benign        Date Noted: 11/03/2012    Vitamin D deficiency        Date Noted: 11/03/2012    Knee pain    Endometrial hyperplasia    Hemorrhoid    Foot pain    Esophageal stricture    Diverticulosis    Hip pain    Gastritis    Tendonitis of ankle    Hiatal hernia  Mixed hyperlipidemia    Asthma    GERD        Date Noted: 10/03/2009   The encounter with the patient was in whole, or in part, for the following medical condition, which is the primary reason for home health care: fracture left humerus   I certify that, based on my findings, the following  services are medically necessary home health services: Physical therapy   Reason for Medically Necessary Home Health Services: Therapy- Personnel officer, Public librarian   My clinical findings support the need for the above services: Pain interferes with ambulation/mobility   Further, I certify that my clinical findings support that this patient is homebound due to: Unable to leave home safely without assistance   Home Health   Complete by: As directed    To provide the following care/treatments:  OT PT     Increase activity slowly as tolerated   Complete by: As directed      Follow-up Information    Roseanne Kaufman, MD Follow up in 14 day(s).   Specialty: Orthopedic Surgery Why: we will call for your follow up Contact information: [address] [place] [address] [phone]           Status post open reduction internal fixation left humerus fracture with a 14 hole [employer] stainless steel plate.  Patient had unremarkable hospital course.  At the time of discharge she was tolerating a regular diet alert oriented and in no acute distress.  No signs of DVT infection or urinary tract infection.  I have discussed with her all issues.  We will advance her to discharge to home today and ask her to notify us any problems occur.  I have gone over all issues with her at length.   Signed: Satira Anis Landree Fernholz III 01/15/2020, 12:29 PM

## 2020-01-16 DIAGNOSIS — M7750 Other enthesopathy of unspecified foot: Secondary | ICD-10-CM | POA: Diagnosis not present

## 2020-01-16 DIAGNOSIS — M1990 Unspecified osteoarthritis, unspecified site: Secondary | ICD-10-CM | POA: Diagnosis not present

## 2020-01-16 DIAGNOSIS — M659 Synovitis and tenosynovitis, unspecified: Secondary | ICD-10-CM | POA: Diagnosis not present

## 2020-01-16 DIAGNOSIS — J45909 Unspecified asthma, uncomplicated: Secondary | ICD-10-CM | POA: Diagnosis not present

## 2020-01-16 DIAGNOSIS — W19XXXD Unspecified fall, subsequent encounter: Secondary | ICD-10-CM | POA: Diagnosis not present

## 2020-01-16 DIAGNOSIS — K449 Diaphragmatic hernia without obstruction or gangrene: Secondary | ICD-10-CM | POA: Diagnosis not present

## 2020-01-16 DIAGNOSIS — I10 Essential (primary) hypertension: Secondary | ICD-10-CM | POA: Diagnosis not present

## 2020-01-16 DIAGNOSIS — M65311 Trigger thumb, right thumb: Secondary | ICD-10-CM | POA: Diagnosis not present

## 2020-01-16 DIAGNOSIS — K219 Gastro-esophageal reflux disease without esophagitis: Secondary | ICD-10-CM | POA: Diagnosis not present

## 2020-01-16 DIAGNOSIS — E785 Hyperlipidemia, unspecified: Secondary | ICD-10-CM | POA: Diagnosis not present

## 2020-01-16 DIAGNOSIS — S42352D Displaced comminuted fracture of shaft of humerus, left arm, subsequent encounter for fracture with routine healing: Secondary | ICD-10-CM | POA: Diagnosis not present

## 2020-01-16 DIAGNOSIS — M48061 Spinal stenosis, lumbar region without neurogenic claudication: Secondary | ICD-10-CM | POA: Diagnosis not present

## 2020-01-18 ENCOUNTER — Other Ambulatory Visit: Payer: Self-pay | Admitting: Family Medicine

## 2020-01-18 DIAGNOSIS — E785 Hyperlipidemia, unspecified: Secondary | ICD-10-CM | POA: Diagnosis not present

## 2020-01-18 DIAGNOSIS — J45909 Unspecified asthma, uncomplicated: Secondary | ICD-10-CM | POA: Diagnosis not present

## 2020-01-18 DIAGNOSIS — K219 Gastro-esophageal reflux disease without esophagitis: Secondary | ICD-10-CM | POA: Diagnosis not present

## 2020-01-18 DIAGNOSIS — M65311 Trigger thumb, right thumb: Secondary | ICD-10-CM | POA: Diagnosis not present

## 2020-01-18 DIAGNOSIS — S42352D Displaced comminuted fracture of shaft of humerus, left arm, subsequent encounter for fracture with routine healing: Secondary | ICD-10-CM | POA: Diagnosis not present

## 2020-01-18 DIAGNOSIS — M1990 Unspecified osteoarthritis, unspecified site: Secondary | ICD-10-CM | POA: Diagnosis not present

## 2020-01-18 DIAGNOSIS — W19XXXD Unspecified fall, subsequent encounter: Secondary | ICD-10-CM | POA: Diagnosis not present

## 2020-01-18 DIAGNOSIS — K449 Diaphragmatic hernia without obstruction or gangrene: Secondary | ICD-10-CM | POA: Diagnosis not present

## 2020-01-18 DIAGNOSIS — M48061 Spinal stenosis, lumbar region without neurogenic claudication: Secondary | ICD-10-CM | POA: Diagnosis not present

## 2020-01-18 DIAGNOSIS — M659 Synovitis and tenosynovitis, unspecified: Secondary | ICD-10-CM | POA: Diagnosis not present

## 2020-01-18 DIAGNOSIS — M7750 Other enthesopathy of unspecified foot: Secondary | ICD-10-CM | POA: Diagnosis not present

## 2020-01-18 DIAGNOSIS — I10 Essential (primary) hypertension: Secondary | ICD-10-CM | POA: Diagnosis not present

## 2020-01-20 DIAGNOSIS — M65311 Trigger thumb, right thumb: Secondary | ICD-10-CM | POA: Diagnosis not present

## 2020-01-20 DIAGNOSIS — I10 Essential (primary) hypertension: Secondary | ICD-10-CM | POA: Diagnosis not present

## 2020-01-20 DIAGNOSIS — M7750 Other enthesopathy of unspecified foot: Secondary | ICD-10-CM | POA: Diagnosis not present

## 2020-01-20 DIAGNOSIS — K449 Diaphragmatic hernia without obstruction or gangrene: Secondary | ICD-10-CM | POA: Diagnosis not present

## 2020-01-20 DIAGNOSIS — M659 Synovitis and tenosynovitis, unspecified: Secondary | ICD-10-CM | POA: Diagnosis not present

## 2020-01-20 DIAGNOSIS — S42352D Displaced comminuted fracture of shaft of humerus, left arm, subsequent encounter for fracture with routine healing: Secondary | ICD-10-CM | POA: Diagnosis not present

## 2020-01-20 DIAGNOSIS — M1990 Unspecified osteoarthritis, unspecified site: Secondary | ICD-10-CM | POA: Diagnosis not present

## 2020-01-20 DIAGNOSIS — W19XXXD Unspecified fall, subsequent encounter: Secondary | ICD-10-CM | POA: Diagnosis not present

## 2020-01-20 DIAGNOSIS — M48061 Spinal stenosis, lumbar region without neurogenic claudication: Secondary | ICD-10-CM | POA: Diagnosis not present

## 2020-01-20 DIAGNOSIS — K219 Gastro-esophageal reflux disease without esophagitis: Secondary | ICD-10-CM | POA: Diagnosis not present

## 2020-01-20 DIAGNOSIS — E785 Hyperlipidemia, unspecified: Secondary | ICD-10-CM | POA: Diagnosis not present

## 2020-01-20 DIAGNOSIS — J45909 Unspecified asthma, uncomplicated: Secondary | ICD-10-CM | POA: Diagnosis not present

## 2020-01-21 ENCOUNTER — Telehealth: Payer: Self-pay | Admitting: Family Medicine

## 2020-01-21 NOTE — Telephone Encounter (Signed)
No appointment available

## 2020-01-21 NOTE — Telephone Encounter (Signed)
  Incoming Patient Call  01/21/2020  What symptoms do you have? Patient states he tongue is real sore and has UTI or bladder infectioin. Burns when she goes to the bathroom. Patient just got out of the hospital for broken arm. Wants to know can she bring urine specimen in since we don't have appt. Too weak to come in the office.  How long have you been sick? Over night  Have you been seen for this problem? NO  If your provider decides to give you a prescription, which pharmacy would you like for it to be sent to? [facility] in [place]   Patient informed that this information will be sent to the clinical staff for review and that they should receive a follow up call.

## 2020-01-21 NOTE — Telephone Encounter (Signed)
Have her schedule appointment for tomorrow, if she is truly too weak because of a urinary infection that she may need to go back to the emergency department but if not we can see her tomorrow we will get her for the urine.  She needs an appointment, can be tomorrow

## 2020-01-22 ENCOUNTER — Other Ambulatory Visit: Payer: Medicare Other

## 2020-01-22 ENCOUNTER — Other Ambulatory Visit: Payer: Self-pay

## 2020-01-22 ENCOUNTER — Ambulatory Visit (INDEPENDENT_AMBULATORY_CARE_PROVIDER_SITE_OTHER): Payer: Medicare Other | Admitting: Family Medicine

## 2020-01-22 ENCOUNTER — Encounter: Payer: Self-pay | Admitting: Family Medicine

## 2020-01-22 DIAGNOSIS — M659 Synovitis and tenosynovitis, unspecified: Secondary | ICD-10-CM | POA: Diagnosis not present

## 2020-01-22 DIAGNOSIS — R300 Dysuria: Secondary | ICD-10-CM | POA: Diagnosis not present

## 2020-01-22 DIAGNOSIS — I10 Essential (primary) hypertension: Secondary | ICD-10-CM | POA: Diagnosis not present

## 2020-01-22 DIAGNOSIS — K449 Diaphragmatic hernia without obstruction or gangrene: Secondary | ICD-10-CM | POA: Diagnosis not present

## 2020-01-22 DIAGNOSIS — J45909 Unspecified asthma, uncomplicated: Secondary | ICD-10-CM | POA: Diagnosis not present

## 2020-01-22 DIAGNOSIS — M1990 Unspecified osteoarthritis, unspecified site: Secondary | ICD-10-CM | POA: Diagnosis not present

## 2020-01-22 DIAGNOSIS — R3989 Other symptoms and signs involving the genitourinary system: Secondary | ICD-10-CM | POA: Diagnosis not present

## 2020-01-22 DIAGNOSIS — E785 Hyperlipidemia, unspecified: Secondary | ICD-10-CM | POA: Diagnosis not present

## 2020-01-22 DIAGNOSIS — S42352D Displaced comminuted fracture of shaft of humerus, left arm, subsequent encounter for fracture with routine healing: Secondary | ICD-10-CM | POA: Diagnosis not present

## 2020-01-22 DIAGNOSIS — W19XXXD Unspecified fall, subsequent encounter: Secondary | ICD-10-CM | POA: Diagnosis not present

## 2020-01-22 DIAGNOSIS — M65311 Trigger thumb, right thumb: Secondary | ICD-10-CM | POA: Diagnosis not present

## 2020-01-22 DIAGNOSIS — M7750 Other enthesopathy of unspecified foot: Secondary | ICD-10-CM | POA: Diagnosis not present

## 2020-01-22 DIAGNOSIS — K219 Gastro-esophageal reflux disease without esophagitis: Secondary | ICD-10-CM | POA: Diagnosis not present

## 2020-01-22 DIAGNOSIS — M48061 Spinal stenosis, lumbar region without neurogenic claudication: Secondary | ICD-10-CM | POA: Diagnosis not present

## 2020-01-22 LAB — URINALYSIS, COMPLETE
Bilirubin, UA: NEGATIVE
Glucose, UA: NEGATIVE
Ketones, UA: NEGATIVE
Nitrite, UA: NEGATIVE
Protein,UA: NEGATIVE
Specific Gravity, UA: 1.025 (ref 1.005–1.030)
Urobilinogen, Ur: 0.2 mg/dL (ref 0.2–1.0)
pH, UA: 7 (ref 5.0–7.5)

## 2020-01-22 LAB — MICROSCOPIC EXAMINATION
Epithelial Cells (non renal): >10 /hpf — AB (ref 0–10)
Renal Epithel, UA: NONE SEEN /hpf

## 2020-01-22 MED ORDER — FLUCONAZOLE 150 MG PO TABS: 150.0000 mg | ORAL_TABLET | Freq: Once | ORAL | 0 refills | Status: AC

## 2020-01-22 MED ORDER — PHENAZOPYRIDINE HCL 200 MG PO TABS: 200.0000 mg | ORAL_TABLET | Freq: Three times a day (TID) | ORAL | 0 refills | Status: AC | PRN

## 2020-01-22 MED ORDER — CEPHALEXIN 500 MG PO CAPS: 500.0000 mg | ORAL_CAPSULE | Freq: Two times a day (BID) | ORAL | 0 refills | Status: AC

## 2020-01-22 NOTE — Telephone Encounter (Signed)
No in office visits available. Pls advise

## 2020-01-22 NOTE — Telephone Encounter (Signed)
Please instruct the patient that there are E visits available or urgent care urgent care, I would venture to guess that yesterday when she called the first time and there were no appointments available for today, we just need to schedule these patients when they call the first time

## 2020-01-22 NOTE — Telephone Encounter (Signed)
Dpuble book me this pm and I will call and do a televisit with pt. When I get a chance.  Please have her expect my call.

## 2020-01-22 NOTE — Progress Notes (Signed)
Telephone visit  Subjective: CC: UTI? PCP: Janora Norlander, DO [id number] Stacie Russell is a 69 y.o. female calls for telephone consult today. Patient provides verbal consent for consult held via phone.  Due to COVID-19 pandemic this visit was conducted virtually. This visit type was conducted due to national recommendations for restrictions regarding the COVID-19 Pandemic (e.g. social distancing, sheltering in place) in an effort to limit this patient's exposure and mitigate transmission in our community. All issues noted in this document were discussed and addressed.  A physical exam was not performed with this format.   Location of patient: home  Location of provider: [facility] Others present for call: spouse  1. Urinary symptoms Patient reports a 2 day h/o dysuria, urinary frequency, urgency.  No hematuria, fevers, chills, abdominal pain, nausea, vomiting, back pain, vaginal discharge.  Patient has used nothing for symptoms.  Patient was recently hospitalized for fracture repair.  She may have been catheterized during that operation.  ROS: Per HPI  No Known Allergies Past Medical History:  Diagnosis Date  . Arthritis   . Asthma   . Diverticulosis   . Endometrial hyperplasia   . Esophageal stricture   . Foot pain   . Fundic gland polyps of stomach, benign 07/10/2015  . Gastritis   . GERD (gastroesophageal reflux disease)   . Hemorrhoid   . Hiatal hernia   . Hip pain   . History of kidney stones    cystoscopy  . Hyperlipidemia   . Hypertension   . Hypothyroidism   . Knee pain   . Metabolic syndrome   . Obstructive sleep apnea 08/27/2017  . Oxygen deficiency    pt denies  . PONV (postoperative nausea and vomiting)   . Tendonitis of ankle   . Vitamin D deficiency     Current Outpatient Medications:  .  acetaminophen (TYLENOL) 500 MG tablet, Take 500 mg by mouth every 6 (six) hours as needed for moderate pain. , Disp: , Rfl:  .  albuterol (PROVENTIL HFA;VENTOLIN HFA) 108  (90 Base) MCG/ACT inhaler, Inhale 2 puffs into the lungs every 6 (six) hours as needed for wheezing or shortness of breath., Disp: , Rfl:  .  aspirin EC 81 MG tablet, Take 81 mg by mouth daily., Disp: , Rfl:  .  budesonide-formoterol (SYMBICORT) 80-4.5 MCG/ACT inhaler, Inhale 2 puffs into the lungs 2 (two) times daily. (Patient taking differently: Inhale 2 puffs into the lungs 2 (two) times daily as needed (sob/wheezing). ), Disp: 1 Inhaler, Rfl: 0 .  calcium carbonate (OSCAL) 1500 (600 Ca) MG TABS tablet, Take 600 mg of elemental calcium by mouth 2 (two) times daily with a meal. 600 mg in am and 900 mg at night, Disp: , Rfl:  .  calcium carbonate (TUMS - DOSED IN MG ELEMENTAL CALCIUM) 500 MG chewable tablet, Chew 1 tablet by mouth daily., Disp: , Rfl:  .  Cholecalciferol (VITAMIN D3) 2000 units TABS, Take 1 tablet by mouth daily., Disp: , Rfl:  .  docusate sodium (COLACE) 100 MG capsule, Take 100 mg by mouth daily., Disp: , Rfl:  .  DULoxetine (CYMBALTA) 30 MG capsule, TAKE 1 CAPSULE BY MOUTH  DAILY, Disp: 90 capsule, Rfl: 0 .  furosemide (LASIX) 20 MG tablet, TAKE 1 TABLET BY MOUTH  DAILY AS NEEDED (Patient taking differently: Take 20 mg by mouth daily as needed for fluid. ), Disp: 90 tablet, Rfl: 1 .  levothyroxine (SYNTHROID) 50 MCG tablet, TAKE 1 TABLET BY MOUTH  DAILY BEFORE BREAKFAST,  Disp: 90 tablet, Rfl: 0 .  losartan-hydrochlorothiazide (HYZAAR) 100-12.5 MG tablet, TAKE 1 TABLET BY MOUTH  DAILY, Disp: 90 tablet, Rfl: 3 .  methocarbamol (ROBAXIN) 500 MG tablet, Take 500 mg by mouth every 6 (six) hours as needed for muscle spasms., Disp: , Rfl:  .  methocarbamol (ROBAXIN) 500 MG tablet, Take 1 tablet (500 mg total) by mouth every 6 (six) hours as needed for muscle spasms., Disp: 40 tablet, Rfl: 0 .  Omega-3 Fatty Acids (FISH OIL) 1000 MG CAPS, Take 1 capsule by mouth every evening. , Disp: , Rfl:  .  oxybutynin (DITROPAN) 5 MG tablet, TAKE 2 TABLETS BY MOUTH  TWICE DAILY (Patient taking  differently: 10 mg. ), Disp: 360 tablet, Rfl: 3 .  oxyCODONE (ROXICODONE) 5 MG immediate release tablet, Take 1 tablet (5 mg total) by mouth every 4 (four) hours as needed., Disp: 40 tablet, Rfl: 0 .  oxyCODONE-acetaminophen (PERCOCET/ROXICET) 5-325 MG tablet, Take 1 tablet by mouth every 4 (four) hours as needed for severe pain., Disp: 15 tablet, Rfl: 0 .  pantoprazole (PROTONIX) 40 MG tablet, TAKE 1 TABLET BY MOUTH ONCE DAILY BEFORE BREAKFAST, Disp: 90 tablet, Rfl: 0  Assessment/ Plan: 69 y.o. female   Suspected UTI - Plan: Urinalysis, Complete, Urine Culture Empiric treatment with Keflex twice daily for 5 days, Pyridium and Diflucan sent for suspected UTI.  I have run the urinalysis but this may be contaminated, particularly since she collected the specimen in a nonsterile bottle.  I sent for urine culture.  We discussed red flag signs and symptoms warranting further evaluation and need for proper collection going forward.  She voiced good understanding will follow up as needed   Orders Placed This Encounter  Procedures  . Urine Culture  . Urinalysis, Complete    Meds ordered this encounter  Medications  . cephALEXin (KEFLEX) 500 MG capsule    Sig: Take 1 capsule (500 mg total) by mouth 2 (two) times daily for 5 days.    Dispense:  10 capsule    Refill:  0  . phenazopyridine (PYRIDIUM) 200 MG tablet    Sig: Take 1 tablet (200 mg total) by mouth 3 (three) times daily as needed for up to 2 days for pain (urinary).    Dispense:  6 tablet    Refill:  0  . fluconazole (DIFLUCAN) 150 MG tablet    Sig: Take 1 tablet (150 mg total) by mouth once for 1 dose. Repeat in 3 days if needed for yeast    Dispense:  2 tablet    Refill:  0    Start time: 1:11pm End time: 1:18pm  Total time spent on patient care (including telephone call/ virtual visit): 12 minutes  [facility], [place] [phone]

## 2020-01-22 NOTE — Telephone Encounter (Signed)
This patients husband dropped of a urine sample. Please advise

## 2020-01-24 LAB — URINE CULTURE

## 2020-01-25 ENCOUNTER — Ambulatory Visit (INDEPENDENT_AMBULATORY_CARE_PROVIDER_SITE_OTHER): Payer: Medicare Other

## 2020-01-25 ENCOUNTER — Other Ambulatory Visit: Payer: Self-pay

## 2020-01-25 ENCOUNTER — Other Ambulatory Visit: Payer: Self-pay | Admitting: Family Medicine

## 2020-01-25 DIAGNOSIS — M8500 Fibrous dysplasia (monostotic), unspecified site: Secondary | ICD-10-CM

## 2020-01-25 DIAGNOSIS — N3281 Overactive bladder: Secondary | ICD-10-CM

## 2020-01-25 DIAGNOSIS — K649 Unspecified hemorrhoids: Secondary | ICD-10-CM

## 2020-01-25 DIAGNOSIS — E8881 Metabolic syndrome: Secondary | ICD-10-CM

## 2020-01-25 DIAGNOSIS — S42352D Displaced comminuted fracture of shaft of humerus, left arm, subsequent encounter for fracture with routine healing: Secondary | ICD-10-CM

## 2020-01-25 DIAGNOSIS — I10 Essential (primary) hypertension: Secondary | ICD-10-CM

## 2020-01-25 DIAGNOSIS — M25629 Stiffness of unspecified elbow, not elsewhere classified: Secondary | ICD-10-CM | POA: Diagnosis not present

## 2020-01-25 DIAGNOSIS — K5790 Diverticulosis of intestine, part unspecified, without perforation or abscess without bleeding: Secondary | ICD-10-CM

## 2020-01-25 DIAGNOSIS — K317 Polyp of stomach and duodenum: Secondary | ICD-10-CM

## 2020-01-25 DIAGNOSIS — J45909 Unspecified asthma, uncomplicated: Secondary | ICD-10-CM | POA: Diagnosis not present

## 2020-01-25 DIAGNOSIS — K449 Diaphragmatic hernia without obstruction or gangrene: Secondary | ICD-10-CM

## 2020-01-25 DIAGNOSIS — M7750 Other enthesopathy of unspecified foot: Secondary | ICD-10-CM

## 2020-01-25 DIAGNOSIS — M65311 Trigger thumb, right thumb: Secondary | ICD-10-CM

## 2020-01-25 DIAGNOSIS — E785 Hyperlipidemia, unspecified: Secondary | ICD-10-CM

## 2020-01-25 DIAGNOSIS — K219 Gastro-esophageal reflux disease without esophagitis: Secondary | ICD-10-CM

## 2020-01-25 DIAGNOSIS — M659 Synovitis and tenosynovitis, unspecified: Secondary | ICD-10-CM

## 2020-01-25 DIAGNOSIS — E039 Hypothyroidism, unspecified: Secondary | ICD-10-CM

## 2020-01-25 DIAGNOSIS — M48061 Spinal stenosis, lumbar region without neurogenic claudication: Secondary | ICD-10-CM

## 2020-01-25 DIAGNOSIS — W19XXXD Unspecified fall, subsequent encounter: Secondary | ICD-10-CM

## 2020-01-25 DIAGNOSIS — Z4789 Encounter for other orthopedic aftercare: Secondary | ICD-10-CM | POA: Diagnosis not present

## 2020-01-25 DIAGNOSIS — M1990 Unspecified osteoarthritis, unspecified site: Secondary | ICD-10-CM

## 2020-01-25 DIAGNOSIS — M25612 Stiffness of left shoulder, not elsewhere classified: Secondary | ICD-10-CM | POA: Diagnosis not present

## 2020-01-25 DIAGNOSIS — G4733 Obstructive sleep apnea (adult) (pediatric): Secondary | ICD-10-CM

## 2020-01-25 MED ORDER — CEFDINIR 300 MG PO CAPS: 300.0000 mg | ORAL_CAPSULE | Freq: Two times a day (BID) | ORAL | 0 refills | Status: AC

## 2020-01-27 ENCOUNTER — Telehealth: Payer: Self-pay | Admitting: Family Medicine

## 2020-01-27 ENCOUNTER — Ambulatory Visit: Payer: Medicare Other

## 2020-01-27 NOTE — Telephone Encounter (Signed)
°  Incoming Patient Call  01/27/2020  What symptoms do you have? :Patient is on fire and hurts to pee. Has had three ABX and is still hurting  How long have you been sick? Last Thursday  Have you been seen for this problem? Yes with Lajuana Ripple  If your provider decides to give you a prescription, which pharmacy would you like for it to be sent to? [facility] in [place]   Patient informed that this information will be sent to the clinical staff for review and that they should receive a follow up call.

## 2020-01-27 NOTE — Telephone Encounter (Signed)
Recent urine test was done and visit. Please advise on what to do.

## 2020-01-27 NOTE — Telephone Encounter (Signed)
Aware to continue with new antibiotic.  Hygiene and try some lubrication at vaginal entrance for relief. Call back if no improvement.

## 2020-01-27 NOTE — Telephone Encounter (Signed)
Please see 6/11 result note.

## 2020-01-28 DIAGNOSIS — H1045 Other chronic allergic conjunctivitis: Secondary | ICD-10-CM | POA: Diagnosis not present

## 2020-01-28 DIAGNOSIS — H18593 Other hereditary corneal dystrophies, bilateral: Secondary | ICD-10-CM | POA: Diagnosis not present

## 2020-01-28 DIAGNOSIS — H35033 Hypertensive retinopathy, bilateral: Secondary | ICD-10-CM | POA: Diagnosis not present

## 2020-01-28 DIAGNOSIS — Z961 Presence of intraocular lens: Secondary | ICD-10-CM | POA: Diagnosis not present

## 2020-02-08 DIAGNOSIS — M25629 Stiffness of unspecified elbow, not elsewhere classified: Secondary | ICD-10-CM | POA: Diagnosis not present

## 2020-02-17 DIAGNOSIS — M25629 Stiffness of unspecified elbow, not elsewhere classified: Secondary | ICD-10-CM | POA: Diagnosis not present

## 2020-02-17 DIAGNOSIS — M25642 Stiffness of left hand, not elsewhere classified: Secondary | ICD-10-CM | POA: Diagnosis not present

## 2020-02-17 DIAGNOSIS — S42352D Displaced comminuted fracture of shaft of humerus, left arm, subsequent encounter for fracture with routine healing: Secondary | ICD-10-CM | POA: Diagnosis not present

## 2020-02-24 DIAGNOSIS — Z13820 Encounter for screening for osteoporosis: Secondary | ICD-10-CM | POA: Diagnosis not present

## 2020-03-01 ENCOUNTER — Ambulatory Visit (INDEPENDENT_AMBULATORY_CARE_PROVIDER_SITE_OTHER): Payer: Medicare Other | Admitting: Family Medicine

## 2020-03-01 ENCOUNTER — Encounter: Payer: Self-pay | Admitting: Family Medicine

## 2020-03-01 ENCOUNTER — Other Ambulatory Visit: Payer: Self-pay

## 2020-03-01 VITALS — BP 124/73 | HR 77 | Temp 97.7°F | Ht 61.0 in | Wt 234.6 lb

## 2020-03-01 DIAGNOSIS — E039 Hypothyroidism, unspecified: Secondary | ICD-10-CM

## 2020-03-01 DIAGNOSIS — Z1159 Encounter for screening for other viral diseases: Secondary | ICD-10-CM

## 2020-03-01 DIAGNOSIS — E782 Mixed hyperlipidemia: Secondary | ICD-10-CM | POA: Diagnosis not present

## 2020-03-01 DIAGNOSIS — R739 Hyperglycemia, unspecified: Secondary | ICD-10-CM | POA: Diagnosis not present

## 2020-03-01 DIAGNOSIS — G4733 Obstructive sleep apnea (adult) (pediatric): Secondary | ICD-10-CM | POA: Diagnosis not present

## 2020-03-01 DIAGNOSIS — R5382 Chronic fatigue, unspecified: Secondary | ICD-10-CM

## 2020-03-01 DIAGNOSIS — R6889 Other general symptoms and signs: Secondary | ICD-10-CM | POA: Diagnosis not present

## 2020-03-01 DIAGNOSIS — R1310 Dysphagia, unspecified: Secondary | ICD-10-CM

## 2020-03-01 DIAGNOSIS — K5909 Other constipation: Secondary | ICD-10-CM

## 2020-03-01 LAB — URINALYSIS
Bilirubin, UA: NEGATIVE
Glucose, UA: NEGATIVE
Ketones, UA: NEGATIVE
Leukocytes,UA: NEGATIVE
Nitrite, UA: NEGATIVE
Protein,UA: NEGATIVE
RBC, UA: NEGATIVE
Specific Gravity, UA: 1.015 (ref 1.005–1.030)
Urobilinogen, Ur: 0.2 mg/dL (ref 0.2–1.0)
pH, UA: 6.5 (ref 5.0–7.5)

## 2020-03-01 LAB — BAYER DCA HB A1C WAIVED: HB A1C (BAYER DCA - WAIVED): 5.4 % (ref <7.0)

## 2020-03-01 MED ORDER — DULOXETINE HCL 30 MG PO CPEP: 30.0000 mg | ORAL_CAPSULE | Freq: Every day | ORAL | 3 refills | Status: DC

## 2020-03-01 MED ORDER — LINACLOTIDE 145 MCG PO CAPS: 145.0000 ug | ORAL_CAPSULE | Freq: Every day | ORAL | 0 refills | Status: DC

## 2020-03-01 NOTE — Patient Instructions (Addendum)
You had labs performed today.  You will be contacted with the results of the labs once they are available, usually in the next 3 business days for routine lab work.  If you have an active my chart account, they will be released to your MyChart.  If you prefer to have these labs released to you via telephone, please let us know.  If you had a pap smear or biopsy performed, expect to be contacted in about 7-10 days.  I have given you samples of Linzess today for constipation.  Take 1 capsule ONCE daily for constipation  Your insurance does not cover linzess but does cover Amitiza (similar product but requires TWICE daily dosing rather than ONCE).  Call me if you find this helpful and I will prescribe the similar medication Amitiza.

## 2020-03-01 NOTE — Progress Notes (Signed)
Subjective: CC: f/u hypothyroidism, HLD PCP: Janora Norlander, DO DPO:EUMPNT Jerilynn Mages Stacie Russell is a 69 y.o. female presenting to clinic today for:  1. Hypothyroidism/OSA/constipation Patient reports compliance with her Synthroid 50 mcg daily.  She has had some fatigue since her surgery.  She is had chronic fatigue in the past but had been doing better prior to her surgery.  She does have a history of OSA documented on previous sleep study but unfortunately was never able to follow-up with her sleep provider to coordinate care.  She does not wish to see a sleep provider again as she has been 3 times already for sleep studies.  She denies any unplanned weight loss, tremor.  She does report chronic constipation that is minimally relieved by OTC medications.  She has 1-2 bowel movements per week which she describes as hard and straining.  No hematochezia or melena  2. HLD with hypertension Compliant with her omega's and Hyzaar.  No chest pain, shortness of breath.  3.  Dysphagia Patient does report the main thing that causes her issues is dysphagia.  She describes difficulty with swallowing and feeling like she has something stuck in her throat when she eats.  She previously required esophageal stretch with Dr. Arelia Longest.  This is approximately 3 to 4 years ago.  She notes acid reflux seems to be worse as well despite twice daily use of her Protonix.  Does not report any hematochezia, melena, nausea or vomiting.  She has not yet reached out to him for an appointment.   ROS: Per HPI  No Known Allergies Past Medical History:  Diagnosis Date  . Arthritis   . Asthma   . Diverticulosis   . Endometrial hyperplasia   . Esophageal stricture   . Foot pain   . Fundic gland polyps of stomach, benign 07/10/2015  . Gastritis   . GERD (gastroesophageal reflux disease)   . Hemorrhoid   . Hiatal hernia   . Hip pain   . History of kidney stones    cystoscopy  . Hyperlipidemia   . Hypertension   .  Hypothyroidism   . Knee pain   . Metabolic syndrome   . Obstructive sleep apnea 08/27/2017  . Oxygen deficiency    pt denies  . PONV (postoperative nausea and vomiting)   . Tendonitis of ankle   . Vitamin D deficiency     Current Outpatient Medications:  .  acetaminophen (TYLENOL) 500 MG tablet, Take 500 mg by mouth every 6 (six) hours as needed for moderate pain. , Disp: , Rfl:  .  albuterol (PROVENTIL HFA;VENTOLIN HFA) 108 (90 Base) MCG/ACT inhaler, Inhale 2 puffs into the lungs every 6 (six) hours as needed for wheezing or shortness of breath., Disp: , Rfl:  .  aspirin EC 81 MG tablet, Take 81 mg by mouth daily., Disp: , Rfl:  .  budesonide-formoterol (SYMBICORT) 80-4.5 MCG/ACT inhaler, Inhale 2 puffs into the lungs 2 (two) times daily. (Patient taking differently: Inhale 2 puffs into the lungs 2 (two) times daily as needed (sob/wheezing). ), Disp: 1 Inhaler, Rfl: 0 .  calcium carbonate (OSCAL) 1500 (600 Ca) MG TABS tablet, Take 600 mg of elemental calcium by mouth 2 (two) times daily with a meal. 600 mg in am and 900 mg at night, Disp: , Rfl:  .  calcium carbonate (TUMS - DOSED IN MG ELEMENTAL CALCIUM) 500 MG chewable tablet, Chew 1 tablet by mouth daily., Disp: , Rfl:  .  Cholecalciferol (VITAMIN D3) 2000 units  TABS, Take 1 tablet by mouth daily., Disp: , Rfl:  .  docusate sodium (COLACE) 100 MG capsule, Take 100 mg by mouth daily., Disp: , Rfl:  .  DULoxetine (CYMBALTA) 30 MG capsule, TAKE 1 CAPSULE BY MOUTH  DAILY, Disp: 90 capsule, Rfl: 0 .  furosemide (LASIX) 20 MG tablet, TAKE 1 TABLET BY MOUTH  DAILY AS NEEDED (Patient taking differently: Take 20 mg by mouth daily as needed for fluid. ), Disp: 90 tablet, Rfl: 1 .  levothyroxine (SYNTHROID) 50 MCG tablet, TAKE 1 TABLET BY MOUTH  DAILY BEFORE BREAKFAST, Disp: 90 tablet, Rfl: 0 .  losartan-hydrochlorothiazide (HYZAAR) 100-12.5 MG tablet, TAKE 1 TABLET BY MOUTH  DAILY, Disp: 90 tablet, Rfl: 3 .  methocarbamol (ROBAXIN) 500 MG tablet,  Take 500 mg by mouth every 6 (six) hours as needed for muscle spasms., Disp: , Rfl:  .  methocarbamol (ROBAXIN) 500 MG tablet, Take 1 tablet (500 mg total) by mouth every 6 (six) hours as needed for muscle spasms., Disp: 40 tablet, Rfl: 0 .  Omega-3 Fatty Acids (FISH OIL) 1000 MG CAPS, Take 1 capsule by mouth every evening. , Disp: , Rfl:  .  oxybutynin (DITROPAN) 5 MG tablet, TAKE 2 TABLETS BY MOUTH  TWICE DAILY (Patient taking differently: 10 mg. ), Disp: 360 tablet, Rfl: 3 .  oxyCODONE (ROXICODONE) 5 MG immediate release tablet, Take 1 tablet (5 mg total) by mouth every 4 (four) hours as needed., Disp: 40 tablet, Rfl: 0 .  oxyCODONE-acetaminophen (PERCOCET/ROXICET) 5-325 MG tablet, Take 1 tablet by mouth every 4 (four) hours as needed for severe pain., Disp: 15 tablet, Rfl: 0 .  pantoprazole (PROTONIX) 40 MG tablet, TAKE 1 TABLET BY MOUTH ONCE DAILY BEFORE BREAKFAST, Disp: 90 tablet, Rfl: 0 Social History   Socioeconomic History  . Marital status: Married    Spouse name: Marcello Moores  . Number of children: 2  . Years of education: 36  . Highest education level: Associate degree: academic program  Occupational History  . Occupation: Retired  Tobacco Use  . Smoking status: Never Smoker  . Smokeless tobacco: Never Used  Vaping Use  . Vaping Use: Never used  Substance and Sexual Activity  . Alcohol use: No  . Drug use: No  . Sexual activity: Not Currently    Birth control/protection: Post-menopausal  Other Topics Concern  . Not on file  Social History Narrative   Married and retired   No EtOH, tobacco or drug use   Social Determinants of Health   Financial Resource Strain: Low Risk   . Difficulty of Paying Living Expenses: Not hard at all  Food Insecurity: No Food Insecurity  . Worried About Charity fundraiser in the Last Year: Never true  . Ran Out of Food in the Last Year: Never true  Transportation Needs: No Transportation Needs  . Lack of Transportation (Medical): No  . Lack  of Transportation (Non-Medical): No  Physical Activity: Inactive  . Days of Exercise per Week: 0 days  . Minutes of Exercise per Session: 0 min  Stress: No Stress Concern Present  . Feeling of Stress : Not at all  Social Connections: Socially Integrated  . Frequency of Communication with Friends and Family: More than three times a week  . Frequency of Social Gatherings with Friends and Family: More than three times a week  . Attends Religious Services: More than 4 times per year  . Active Member of Clubs or Organizations: Yes  . Attends Archivist  Meetings: More than 4 times per year  . Marital Status: Married  Human resources officer Violence: Not At Risk  . Fear of Current or Ex-Partner: No  . Emotionally Abused: No  . Physically Abused: No  . Sexually Abused: No   Family History  Problem Relation Age of Onset  . Cancer Mother        Liver, stomach, colon  . Colon cancer Mother   . Heart attack Father   . Heart disease Sister   . Breast cancer Sister   . Breast cancer Sister   . Esophageal cancer Neg Hx   . Stomach cancer Neg Hx   . Rectal cancer Neg Hx     Objective: Office vital signs reviewed. BP 124/73   Pulse 77   Temp 97.7 F (36.5 C)   Ht _0  (1.549 m)   Wt 234 lb 9.6 oz (106.4 kg)   SpO2 96%   BMI 44.33 kg/m   Physical Examination:  General: Awake, alert, morbidly obese, No acute distress HEENT: Normal.  No exophthalmos. Cardio: regular rate and rhythm, S1S2 heard, no murmurs appreciated Pulm: clear to auscultation bilaterally, no wheezes, rhonchi or rales; normal work of breathing on room air GI: Obese Extremities: warm, well perfused, No edema, cyanosis or clubbing; +2 pulses bilaterally MSK: Left upper extremity in a shoulder brace. Skin: dry; intact; no rashes or lesions; normal temperature  neuro: No tremor  Assessment/ Plan: 69 y.o. female   1. Acquired hypothyroidism Fatigue.  Check thyroid panel - Thyroid Panel With TSH  2. Mixed  hyperlipidemia - Lipid panel  3. Obstructive sleep apnea Not treated.  From chart review it appears she has severe sleep apnea.  We have discussed this before and I have recommended that she consider seeing a sleep provider again but she seems reluctant to do this.  4. Chronic fatigue Again I think this is likely from OSA versus OHS.  However, we will check for metabolic etiology - Vitamin B12 - CBC with Differential - Urinalysis - Bayer DCA Hb A1c Waived - CMP14+EGFR  5. Encounter for hepatitis C screening test for low risk patient - Hepatitis C antibody  6. Dysphagia, unspecified type Recommended follow-up with her GI provider.  Wonder if she needs a repeat esophageal stretch  7. Chronic constipation Trial of Linzess.  Her insurance unfortunately does not cover Linzess but does cover Amitiza, which I would be happy to prescribe her should she find this medication is helpful. - linaclotide (LINZESS) 145 MCG CAPS capsule; Take 1 capsule (145 mcg total) by mouth daily before breakfast.  Dispense: 8 capsule; Refill: 0   No orders of the defined types were placed in this encounter.  No orders of the defined types were placed in this encounter.    Janora Norlander, DO [place] [phone]

## 2020-03-02 LAB — LIPID PANEL
Chol/HDL Ratio: 3.7 ratio (ref 0.0–4.4)
Cholesterol, Total: 193 mg/dL (ref 100–199)
HDL: 52 mg/dL (ref >39)
LDL Chol Calc (NIH): 123 mg/dL — ABNORMAL HIGH (ref 0–99)
Triglycerides: 99 mg/dL (ref 0–149)
VLDL Cholesterol Cal: 18 mg/dL (ref 5–40)

## 2020-03-02 LAB — CBC WITH DIFFERENTIAL/PLATELET
Basophils Absolute: 0 10*3/uL (ref 0.0–0.2)
Basos: 0 %
EOS (ABSOLUTE): 0.2 10*3/uL (ref 0.0–0.4)
Eos: 4 %
Hematocrit: 38.2 % (ref 34.0–46.6)
Hemoglobin: 12.9 g/dL (ref 11.1–15.9)
Immature Grans (Abs): 0 10*3/uL (ref 0.0–0.1)
Immature Granulocytes: 0 %
Lymphocytes Absolute: 2 10*3/uL (ref 0.7–3.1)
Lymphs: 31 %
MCH: 30.1 pg (ref 26.6–33.0)
MCHC: 33.8 g/dL (ref 31.5–35.7)
MCV: 89 fL (ref 79–97)
Monocytes Absolute: 0.5 10*3/uL (ref 0.1–0.9)
Monocytes: 7 %
Neutrophils Absolute: 3.7 10*3/uL (ref 1.4–7.0)
Neutrophils: 58 %
Platelets: 250 10*3/uL (ref 150–450)
RBC: 4.29 x10E6/uL (ref 3.77–5.28)
RDW: 12.3 % (ref 11.7–15.4)
WBC: 6.4 10*3/uL (ref 3.4–10.8)

## 2020-03-02 LAB — THYROID PANEL WITH TSH
Free Thyroxine Index: 1.6 (ref 1.2–4.9)
T3 Uptake Ratio: 25 % (ref 24–39)
T4, Total: 6.2 ug/dL (ref 4.5–12.0)
TSH: 3.38 u[IU]/mL (ref 0.450–4.500)

## 2020-03-02 LAB — CMP14+EGFR
ALT: 18 IU/L (ref 0–32)
AST: 18 IU/L (ref 0–40)
Albumin/Globulin Ratio: 1.6 (ref 1.2–2.2)
Albumin: 4.2 g/dL (ref 3.8–4.8)
Alkaline Phosphatase: 112 IU/L (ref 48–121)
BUN/Creatinine Ratio: 27 (ref 12–28)
BUN: 18 mg/dL (ref 8–27)
Bilirubin Total: 0.4 mg/dL (ref 0.0–1.2)
CO2: 23 mmol/L (ref 20–29)
Calcium: 9.5 mg/dL (ref 8.7–10.3)
Chloride: 104 mmol/L (ref 96–106)
Creatinine, Ser: 0.66 mg/dL (ref 0.57–1.00)
GFR calc Af Amer: 104 mL/min/{1.73_m2} (ref >59)
GFR calc non Af Amer: 90 mL/min/{1.73_m2} (ref >59)
Globulin, Total: 2.6 g/dL (ref 1.5–4.5)
Glucose: 93 mg/dL (ref 65–99)
Potassium: 4.4 mmol/L (ref 3.5–5.2)
Sodium: 141 mmol/L (ref 134–144)
Total Protein: 6.8 g/dL (ref 6.0–8.5)

## 2020-03-02 LAB — HEPATITIS C ANTIBODY: Hep C Virus Ab: <0.1 s/co ratio (ref 0.0–0.9)

## 2020-03-02 LAB — VITAMIN B12: Vitamin B-12: 414 pg/mL (ref 232–1245)

## 2020-03-03 ENCOUNTER — Telehealth: Payer: Self-pay | Admitting: Family Medicine

## 2020-03-03 NOTE — Telephone Encounter (Signed)
Pt calling for lab results.

## 2020-03-03 NOTE — Telephone Encounter (Signed)
PATIENT VIEWED RESULTS IN [facility]

## 2020-03-11 ENCOUNTER — Telehealth: Payer: Self-pay

## 2020-03-11 MED ORDER — PANTOPRAZOLE SODIUM 40 MG PO TBEC: DELAYED_RELEASE_TABLET | ORAL | 0 refills | Status: DC

## 2020-03-11 NOTE — Telephone Encounter (Signed)
Patient aware and verbalizes understanding.  Rx given until she can get in with [facility].

## 2020-03-11 NOTE — Telephone Encounter (Signed)
When I spoke to her last, she noted that her dysphagia was worse with the Protonix BID.  She was only supposed to stick with that temporarily to see if it would relieve.  I can renew but she should be taking once daily and following up with Carlean Purl.

## 2020-03-11 NOTE — Telephone Encounter (Signed)
Patient states that she is supposed to be taking Pantoprozole BID.  According to chart it is just daily.  Please advise if this is supposed to be BID.  Patient is out of medication and needs rx sent to [facility].

## 2020-03-16 ENCOUNTER — Other Ambulatory Visit: Payer: Self-pay | Admitting: Family Medicine

## 2020-03-16 DIAGNOSIS — M25612 Stiffness of left shoulder, not elsewhere classified: Secondary | ICD-10-CM | POA: Diagnosis not present

## 2020-03-16 DIAGNOSIS — S42352D Displaced comminuted fracture of shaft of humerus, left arm, subsequent encounter for fracture with routine healing: Secondary | ICD-10-CM | POA: Diagnosis not present

## 2020-03-16 DIAGNOSIS — M25629 Stiffness of unspecified elbow, not elsewhere classified: Secondary | ICD-10-CM | POA: Diagnosis not present

## 2020-03-24 DIAGNOSIS — M25612 Stiffness of left shoulder, not elsewhere classified: Secondary | ICD-10-CM | POA: Diagnosis not present

## 2020-04-05 DIAGNOSIS — M25612 Stiffness of left shoulder, not elsewhere classified: Secondary | ICD-10-CM | POA: Diagnosis not present

## 2020-04-21 DIAGNOSIS — M25629 Stiffness of unspecified elbow, not elsewhere classified: Secondary | ICD-10-CM | POA: Diagnosis not present

## 2020-04-27 DIAGNOSIS — Z4789 Encounter for other orthopedic aftercare: Secondary | ICD-10-CM | POA: Diagnosis not present

## 2020-04-27 DIAGNOSIS — M65311 Trigger thumb, right thumb: Secondary | ICD-10-CM | POA: Diagnosis not present

## 2020-04-27 DIAGNOSIS — M25612 Stiffness of left shoulder, not elsewhere classified: Secondary | ICD-10-CM | POA: Diagnosis not present

## 2020-05-02 ENCOUNTER — Telehealth: Payer: Self-pay | Admitting: *Deleted

## 2020-05-02 NOTE — Telephone Encounter (Signed)
VM from Shanon Brow w/ [facility] Pt is needing a new Rx & PA on Oxybutin 5 mg sent to [facility]

## 2020-05-03 MED ORDER — LOSARTAN POTASSIUM-HCTZ 100-12.5 MG PO TABS: 1.0000 | ORAL_TABLET | Freq: Every day | ORAL | 3 refills | Status: DC

## 2020-05-03 MED ORDER — OXYBUTYNIN CHLORIDE 5 MG PO TABS
10.0000 mg | ORAL_TABLET | Freq: Two times a day (BID) | ORAL | 3 refills | Status: DC
Start: 2020-05-03 — End: 2020-08-31

## 2020-05-03 NOTE — Telephone Encounter (Signed)
Pt called and dose clarified  - sent in med to mail order - if PA needed - they will send it to [place].

## 2020-05-03 NOTE — Addendum Note (Signed)
Addended by: Zannie Cove on: 05/03/2020 08:53 AM   Modules accepted: Orders

## 2020-05-10 DIAGNOSIS — M25612 Stiffness of left shoulder, not elsewhere classified: Secondary | ICD-10-CM | POA: Diagnosis not present

## 2020-05-11 ENCOUNTER — Ambulatory Visit (INDEPENDENT_AMBULATORY_CARE_PROVIDER_SITE_OTHER): Payer: Medicare Other

## 2020-05-11 DIAGNOSIS — Z0000 Encounter for general adult medical examination without abnormal findings: Secondary | ICD-10-CM

## 2020-05-11 NOTE — Patient Instructions (Signed)
  Ms. Flegal , Thank you for taking time to come for your Medicare Wellness Visit. I appreciate your ongoing commitment to your health goals. Please review the following plan we discussed and let me know if I can assist you in the future.   These are the goals we discussed: Goals    . DIET - INCREASE WATER INTAKE     Try to drink 6-8 glasses of water daily.    Marland Kitchen DIET - REDUCE SUGAR INTAKE     Reduce ice cream intake to 3 nights per week.        This is a list of the screening recommended for you and due dates:  Health Maintenance  Topic Date Due  . Flu Shot  03/13/2020  . Colon Cancer Screening  07/03/2020  . Tetanus Vaccine  04/25/2021  . Mammogram  06/24/2021  . DEXA scan (bone density measurement)  Completed  . COVID-19 Vaccine  Completed  .  Hepatitis C: One time screening is recommended by Center for Disease Control  (CDC) for  adults born from [birth year] through 1965.   Completed  . Pneumonia vaccines  Completed

## 2020-05-11 NOTE — Progress Notes (Signed)
MEDICARE ANNUAL WELLNESS VISIT  05/11/2020  Telephone Visit Disclaimer This Medicare AWV was conducted by telephone due to national recommendations for restrictions regarding the COVID-19 Pandemic (e.g. social distancing).  I verified, using two identifiers, that I am speaking with Stacie Russell or their authorized healthcare agent. I discussed the limitations, risks, security, and privacy concerns of performing an evaluation and management service by telephone and the potential availability of an in-person appointment in the future. The patient expressed understanding and agreed to proceed.  Location of Patient: Home Location of Provider (nurse):  [facility]  Subjective:    Stacie Russell is a 69 y.o. female patient of Stacie Norlander, DO who had a Medicare Annual Wellness Visit today via telephone. Stacie Russell lives in nearby [place] with her husband Stacie Russell. Together they have two children that live close by. 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 She enjoys baking and stays very active. She visits the recreation department 2-3 times a week for exercise. She eats 3 meals daily  Patient Care Team: Stacie Norlander, DO as PCP - General (Family Medicine) Lorretta Harp, MD as PCP - Cardiology (Cardiology) Irine Seal, MD (Urology) Paula Compton, MD (Obstetrics and Gynecology) Gatha Mayer, MD (Gastroenterology) Gaynelle Arabian, MD as Consulting Physician (Orthopedic Surgery) Ilean China, RN as Registered Nurse  Advanced Directives 05/11/2020 01/12/2020 12/31/2019 04/29/2019 04/22/2018 04/22/2018 10/07/2017  Does Patient Have a Medical Advance Directive? Yes No No Yes Yes - Yes  Type of Advance Directive [place] will - Living will [place];Living will  Does patient want to make changes to medical advance directive? No - Patient declined - - No - Patient declined Yes  (MAU/Ambulatory/Procedural Areas - Information given) No - Patient declined No - Patient declined  Copy of [place] in Chart? No - copy requested - - - - - No - copy requested  Would patient like information on creating a medical advance directive? - No - Patient declined - - - - [facility] Utilization Over the Past 12 Months: # of hospitalizations or ER visits: 1 # of surgeries: 1  Review of Systems    Patient reports that her overall health is unchanged compared to last year.  History obtained from chart review  Patient Reported Readings (BP, Pulse, CBG, Weight, etc) none  Pain Assessment Pain : No/denies pain     Current Medications & Allergies (verified) Allergies as of 05/11/2020   No Known Allergies     Medication List       Accurate as of May 11, 2020  2:05 PM. If you have any questions, ask your nurse or doctor.        acetaminophen 500 MG tablet Commonly known as: TYLENOL Take 500 mg by mouth every 6 (six) hours as needed for moderate pain.   albuterol 108 (90 Base) MCG/ACT inhaler Commonly known as: VENTOLIN HFA Inhale 2 puffs into the lungs every 6 (six) hours as needed for wheezing or shortness of breath.   aspirin EC 81 MG tablet Take 81 mg by mouth daily.   budesonide-formoterol 80-4.5 MCG/ACT inhaler Commonly known as: SYMBICORT Inhale 2 puffs into the lungs 2 (two) times daily. What changed:   when to take this  reasons to take this   calcium carbonate 1500 (600 Ca) MG Tabs tablet Commonly known as: OSCAL Take 600 mg of  elemental calcium by mouth 2 (two) times daily with a meal. 600 mg in am and 900 mg at night   calcium carbonate 500 MG chewable tablet Commonly known as: TUMS - dosed in mg elemental calcium Chew 1 tablet by mouth daily.   docusate sodium 100 MG capsule Commonly known as: COLACE Take 100 mg by mouth daily.   DULoxetine 30 MG capsule Commonly known as: CYMBALTA Take 1 capsule (30 mg total)  by mouth daily.   Fish Oil 1000 MG Caps Take 1 capsule by mouth every evening.   furosemide 20 MG tablet Commonly known as: LASIX TAKE 1 TABLET BY MOUTH  DAILY AS NEEDED What changed: reasons to take this   levothyroxine 50 MCG tablet Commonly known as: SYNTHROID TAKE 1 TABLET BY MOUTH  DAILY BEFORE BREAKFAST   linaclotide 145 MCG Caps capsule Commonly known as: Linzess Take 1 capsule (145 mcg total) by mouth daily before breakfast.   losartan-hydrochlorothiazide 100-12.5 MG tablet Commonly known as: HYZAAR Take 1 tablet by mouth daily.   oxybutynin 5 MG tablet Commonly known as: DITROPAN Take 2 tablets (10 mg total) by mouth 2 (two) times daily.   pantoprazole 40 MG tablet Commonly known as: PROTONIX TAKE 1 TABLET BY MOUTH ONCE DAILY BEFORE BREAKFAST   Vitamin D3 50 MCG (2000 UT) Tabs Take 1 tablet by mouth daily.       History (reviewed): Past Medical History:  Diagnosis Date  . Arthritis   . Asthma   . Diverticulosis   . Endometrial hyperplasia   . Esophageal stricture   . Foot pain   . Fundic gland polyps of stomach, benign 07/10/2015  . Gastritis   . GERD (gastroesophageal reflux disease)   . Hemorrhoid   . Hiatal hernia   . Hip pain   . History of kidney stones    cystoscopy  . History of surgery on arm    left humerus  . Hyperlipidemia   . Hypertension   . Hypothyroidism   . Knee pain   . Metabolic syndrome   . Obstructive sleep apnea 08/27/2017  . Oxygen deficiency    pt denies  . PONV (postoperative nausea and vomiting)   . Tendonitis of ankle   . Vitamin D deficiency    Past Surgical History:  Procedure Laterality Date  . ANAL FISSURE REPAIR    . CATARACT EXTRACTION  May 2009  . COLONOSCOPY  12/26/07  . ESOPHAGEAL DILATION    . ESOPHAGOGASTRODUODENOSCOPY  12/26/07, 05/13/08  . EYE SURGERY Bilateral    bilateral cataract with lens implants  . KNEE CARTILAGE SURGERY Right Sept 1999  . ORIF HUMERUS FRACTURE Left 01/12/2020   Procedure:  OPEN REDUCTION INTERNAL FIXATION (ORIF) LEFT HUMERUS FRACTURE;  Surgeon: Roseanne Kaufman, MD;  Location: [facility];  Service: Orthopedics;  Laterality: Left;  . 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 UMBILICAL HERNIA REPAIR  05/20/06   Family History  Problem Relation Age of Onset  . Cancer Mother        Liver, stomach, colon  . Colon cancer Mother   . Heart attack Father   . Heart disease Sister   . Breast cancer Sister   . Breast cancer Sister   . Esophageal cancer Neg Hx   . Stomach cancer Neg Hx   . Rectal cancer Neg Hx    Social History  Socioeconomic History  . Marital status: Married    Spouse name: Marcello Moores  . Number of children: 2  . Years of education: 63  . Highest education level: Associate degree: academic program  Occupational History  . Occupation: Retired  Tobacco Use  . Smoking status: Never Smoker  . Smokeless tobacco: Never Used  Vaping Use  . Vaping Use: Never used  Substance and Sexual Activity  . Alcohol use: No  . Drug use: No  . Sexual activity: Not Currently    Birth control/protection: Post-menopausal  Other Topics Concern  . Not on file  Social History Narrative   Married and retired   No EtOH, tobacco or drug use   Social Determinants of Health   Financial Resource Strain:   . Difficulty of Paying Living Expenses: Not on file  Food Insecurity:   . Worried About Charity fundraiser in the Last Year: Not on file  . Ran Out of Food in the Last Year: Not on file  Transportation Needs:   . Lack of Transportation (Medical): Not on file  . Lack of Transportation (Non-Medical): Not on file  Physical Activity:   . Days of Exercise per Week: Not on file  . Minutes of Exercise per Session: Not on file  Stress:   . Feeling of Stress : Not on file  Social Connections:   . Frequency of  Communication with Friends and Family: Not on file  . Frequency of Social Gatherings with Friends and Family: Not on file  . Attends Religious Services: Not on file  . Active Member of Clubs or Organizations: Not on file  . Attends Archivist Meetings: Not on file  . Marital Status: Not on file    Activities of Daily Living In your present state of health, do you have any difficulty performing the following activities: 05/11/2020 01/12/2020  Hearing? N -  Vision? N -  Difficulty concentrating or making decisions? N -  Walking or climbing stairs? N -  Dressing or bathing? N -  Doing errands, shopping? N N  Some recent data might be hidden    Patient Education/ Literacy    Exercise    Diet Patient reports consuming 3 meals a day and 2 snack(s) a day Patient reports that her primary diet is: Regular Patient reports that she does have regular access to food.   Depression Screen PHQ 2/9 Scores 03/01/2020 08/20/2019 04/29/2019 06/25/2018 04/22/2018 12/18/2017 07/15/2017  PHQ - 2 Score 0 0 0 0 0 0 0  PHQ- 9 Score - 0 - - - - -     Fall Risk Fall Risk  03/01/2020 03/01/2020 08/20/2019 04/29/2019 06/25/2018  Falls in the past year? 1 0 0 0 0  Number falls in past yr: 0 - - 0 -  Injury with Fall? 1 - - 0 -  Risk for fall due to : History of fall(s) - - - -  Follow up Falls evaluation completed - - Falls prevention discussed -  Comment - - - Get rid of all throw rugs in the house, adequate lighting in the walk ways and grab bars in the bathroom. -     Objective:  Stacie Russell seemed alert and oriented and she participated appropriately during our telephone visit.  Blood Pressure Weight BMI  BP Readings from Last 3 Encounters:  03/01/20 124/73  01/15/20 125/64  12/31/19 (!) 153/96   Wt Readings from Last 3 Encounters:  03/01/20 234 lb 9.6 oz (106.4 kg)  01/12/20 240 lb (108.9 kg)  12/31/19 240 lb (108.9 kg)   BMI Readings from Last 1 Encounters:  03/01/20 44.33 kg/m      *Unable to obtain current vital signs, weight, and BMI due to telephone visit type  Hearing/Vision  . Taija did not seem to have difficulty with hearing/understanding during the telephone conversation . Reports that she has had a formal eye exam by an eye care professional within the past year . Reports that she has not had a formal hearing evaluation within the past year *Unable to fully assess hearing and vision during telephone visit type  Cognitive Function: 6CIT Screen 05/11/2020 04/29/2019  What Year? 0 points 0 points  What month? 0 points 0 points  What time? 0 points 0 points  Count back from 20 0 points 0 points  Months in reverse 0 points 0 points  Repeat phrase 0 points 0 points  Total Score 0 0   (Normal:0-7, Significant for Dysfunction: >8)  Normal Cognitive Function Screening: Yes   Immunization & Health Maintenance Record Immunization History  Administered Date(s) Administered  . Fluad Quad(high Dose 65+) 06/16/2019  . Influenza Whole 07/25/2012  . Influenza, High Dose Seasonal PF 06/11/2017, 06/25/2018  . Influenza,inj,Quad PF,6+ Mos 05/06/2013, 06/25/2014, 06/20/2015  . Moderna SARS-COVID-2 Vaccination 09/14/2019, 10/05/2019  . Pneumococcal Conjugate-13 05/06/2013  . Pneumococcal Polysaccharide-23 09/05/2016  . Tdap 04/26/2011  . Zoster 04/26/2011    Health Maintenance  Topic Date Due  . INFLUENZA VACCINE  03/13/2020  . COLONOSCOPY  07/03/2020  . TETANUS/TDAP  04/25/2021  . MAMMOGRAM  06/24/2021  . DEXA SCAN  Completed  . COVID-19 Vaccine  Completed  . Hepatitis C Screening  Completed  . PNA vac Low Risk Adult  Completed       Assessment  This is a routine wellness examination for Stacie Russell.  Health Maintenance: Due or Overdue Health Maintenance Due  Topic Date Due  . INFLUENZA VACCINE  03/13/2020    Stacie Russell does not need a referral for Community Assistance: Care Management:   no Social Work:    no Prescription  Assistance:  no Nutrition/Diabetes Education:  no   Plan:  Personalized Goals Goals Addressed   None    Personalized Health Maintenance & Screening Recommendations  Influenza vaccine  Lung Cancer Screening Recommended: no (Low Dose CT Chest recommended if Age 27-80 years, 30 pack-year currently smoking OR have quit w/in past 15 years) Hepatitis C Screening recommended: Done  HIV Screening recommended: no  Advanced Directives: Written information was not prepared per patient's request.  Referrals & Orders No orders of the defined types were placed in this encounter.   Follow-up Plan . Follow-up with Stacie Norlander, DO as planned . Schedule for a flu shot and mammogram in upcoming month    I have personally reviewed and noted the following in the patient's chart:   . Medical and social history . Use of alcohol, tobacco or illicit drugs  . Current medications and supplements . Functional ability and status . Nutritional status . Physical activity . Advanced directives . List of other physicians . Hospitalizations, surgeries, and ER visits in previous 12 months . Vitals . Screenings to include cognitive, depression, and falls . Referrals and appointments  In addition, I have reviewed and discussed with Stacie Russell certain preventive protocols, quality metrics, and best practice recommendations. A written personalized care plan for preventive services as well as general preventive health recommendations is available and can be mailed  to the patient at her request.      Rolena Infante LPN 05/01/8021

## 2020-05-25 ENCOUNTER — Other Ambulatory Visit: Payer: Self-pay | Admitting: Family Medicine

## 2020-06-30 DIAGNOSIS — Z1231 Encounter for screening mammogram for malignant neoplasm of breast: Secondary | ICD-10-CM | POA: Diagnosis not present

## 2020-07-01 ENCOUNTER — Ambulatory Visit: Payer: Medicare Other | Admitting: *Deleted

## 2020-07-01 DIAGNOSIS — I10 Essential (primary) hypertension: Secondary | ICD-10-CM

## 2020-07-01 DIAGNOSIS — E782 Mixed hyperlipidemia: Secondary | ICD-10-CM

## 2020-07-01 NOTE — Patient Instructions (Signed)
Plan:  . The patient has been provided with contact information for the care management team and has been advised to call if they would like to re-enroll in the CCM program to receive care management and care coordination services.  . CCM enrollment status changed to "previously enrolled" as per patient request on 07/01/2020  to discontinue enrollment. Case closed to case management services in primary care home.  . Patient was provided with general disease management education and encouraged to follow-up with their PCP and specialists as recommended.   Chong Sicilian, BSN, RN-BC Embedded Chronic Care Manager [facility] / [place] Management Direct Dial: [phone]

## 2020-07-01 NOTE — Chronic Care Management (AMB) (Signed)
  Chronic Care Management   Initial Visit Note  07/01/2020 Name: Stacie Russell MRN: [id number] DOB: 03-19-1951  Referred by: Stacie Norlander, DO Reason for referral : Chronic Care Management (Initial Visit)   Stacie Russell is a 69 y.o. year old female who is a primary care patient of Stacie Norlander, DO. The CCM team was consulted for assistance with chronic disease management and care coordination needs related to HTN, HLD, Pulmonary Disease and hypothyroidism  Review of patient status, including review of consultants reports, relevant laboratory and other test results was performed prior to outreach.   I spoke with Stacie Russell by telephone today regarding management of their chronic medical conditions. They do not have any CCM or resource needs and feel that their medical conditions are well managed. They appreciated the outreach, but do not feel that CCM services are needed at this time. They will reach out in the future if a need arises.   SDOH (Social Determinants of Health) assessments performed: Yes See Care Plan activities for detailed interventions related to SDOH     Plan:  . The patient has been provided with contact information for the care management team and has been advised to call if they would like to re-enroll in the CCM program to receive care management and care coordination services.  . CCM enrollment status changed to "previously enrolled" as per patient request on 07/01/2020  to discontinue enrollment. Case closed to case management services in primary care home.  . Patient was provided with general disease management education and encouraged to follow-up with their PCP and specialists as recommended.   Chong Sicilian, BSN, RN-BC Embedded Chronic Care Manager [facility] / [place] [facility] Direct Dial: [phone]

## 2020-08-11 ENCOUNTER — Encounter: Payer: Self-pay | Admitting: Family Medicine

## 2020-08-11 ENCOUNTER — Ambulatory Visit (INDEPENDENT_AMBULATORY_CARE_PROVIDER_SITE_OTHER): Payer: Medicare Other | Admitting: Family Medicine

## 2020-08-11 DIAGNOSIS — R3989 Other symptoms and signs involving the genitourinary system: Secondary | ICD-10-CM | POA: Diagnosis not present

## 2020-08-11 MED ORDER — CEFDINIR 300 MG PO CAPS: 300.0000 mg | ORAL_CAPSULE | Freq: Two times a day (BID) | ORAL | 0 refills | Status: DC

## 2020-08-11 NOTE — Progress Notes (Signed)
   Virtual Visit via telephone Note  I connected with Stacie Russell on 08/11/20 at 1720 by telephone and verified that I am speaking with the correct person using two identifiers. Stacie Russell is currently located at home and patient are currently with her during visit. The provider, Elige Radon Riva Sesma, MD is located in their office at time of visit.  Call ended at 1726  I discussed the limitations, risks, security and privacy concerns of performing an evaluation and management service by telephone and the availability of in person appointments. I also discussed with the patient that there may be a patient responsible charge related to this service. The patient expressed understanding and agreed to proceed.   History and Present Illness: Patient is calling in for dysuria and frequency.  This has been going for around 1 month off and on. She does have lower abdominal pressure at times. She denies any hematuria. She has been having these off and on every now and then. She denies any fevers or chills except the winter weather.    No diagnosis found.    Review of Systems  Constitutional: Negative for chills and fever.  Eyes: Negative for visual disturbance.  Respiratory: Negative for chest tightness and shortness of breath.   Cardiovascular: Negative for chest pain and leg swelling.  Gastrointestinal: Positive for abdominal pain.  Genitourinary: Positive for dysuria, frequency and urgency. Negative for difficulty urinating, hematuria, vaginal bleeding, vaginal discharge and vaginal pain.  Musculoskeletal: Negative for back pain and gait problem.  Skin: Negative for rash.  Neurological: Negative for light-headedness and headaches.  Psychiatric/Behavioral: Negative for agitation and behavioral problems.  All other systems reviewed and are negative.   Observations/Objective: Patient sounds comfortable and in no acute distress.   Assessment and Plan: Problem List Items Addressed  This Visit   None   Visit Diagnoses    Suspected UTI    -  Primary   Relevant Medications   cefdinir (OMNICEF) 300 MG capsule       Follow up plan: Return if symptoms worsen or fail to improve.     I discussed the assessment and treatment plan with the patient. The patient was provided an opportunity to ask questions and all were answered. The patient agreed with the plan and demonstrated an understanding of the instructions.   The patient was advised to call back or seek an in-person evaluation if the symptoms worsen or if the condition fails to improve as anticipated.  The above assessment and management plan was discussed with the patient. The patient verbalized understanding of and has agreed to the management plan. Patient is aware to call the clinic if symptoms persist or worsen. Patient is aware when to return to the clinic for a follow-up visit. Patient educated on when it is appropriate to go to the emergency department.    I provided 6 minutes of non-face-to-face time during this encounter.    Nils Pyle, MD

## 2020-08-18 ENCOUNTER — Telehealth: Payer: Self-pay | Admitting: *Deleted

## 2020-08-18 MED ORDER — PANTOPRAZOLE SODIUM 40 MG PO TBEC: DELAYED_RELEASE_TABLET | ORAL | 0 refills | Status: DC

## 2020-08-18 NOTE — Telephone Encounter (Signed)
Refill sent to pharmacy.   

## 2020-08-18 NOTE — Telephone Encounter (Signed)
08/18/2020  Needs 90 day supply of pantoprazole sent to [facility]. Patient scheduled her 6 month follow-up during her last visit on 03/01/20 for 08/31/2020, and will not have enough medication to last until then. Needs a 90 day supply because it is free through [facility] and a 30 day supply is not.   Routing to [facility] Clinical Staff.  Demetrios Loll, BSN, RN-BC Embedded Chronic Care Manager [facility] / [facility] Care Management Direct Dial: [phone]

## 2020-08-31 ENCOUNTER — Other Ambulatory Visit: Payer: Self-pay

## 2020-08-31 ENCOUNTER — Encounter: Payer: Self-pay | Admitting: Family Medicine

## 2020-08-31 ENCOUNTER — Ambulatory Visit (INDEPENDENT_AMBULATORY_CARE_PROVIDER_SITE_OTHER): Payer: Medicare Other

## 2020-08-31 ENCOUNTER — Ambulatory Visit (INDEPENDENT_AMBULATORY_CARE_PROVIDER_SITE_OTHER): Payer: Medicare Other | Admitting: Family Medicine

## 2020-08-31 VITALS — BP 157/75 | HR 52 | Temp 97.4°F | Ht 61.0 in | Wt 233.0 lb

## 2020-08-31 DIAGNOSIS — R0781 Pleurodynia: Secondary | ICD-10-CM | POA: Diagnosis not present

## 2020-08-31 DIAGNOSIS — W19XXXA Unspecified fall, initial encounter: Secondary | ICD-10-CM

## 2020-08-31 DIAGNOSIS — I10 Essential (primary) hypertension: Secondary | ICD-10-CM

## 2020-08-31 DIAGNOSIS — N3281 Overactive bladder: Secondary | ICD-10-CM | POA: Diagnosis not present

## 2020-08-31 DIAGNOSIS — Z23 Encounter for immunization: Secondary | ICD-10-CM

## 2020-08-31 DIAGNOSIS — E039 Hypothyroidism, unspecified: Secondary | ICD-10-CM | POA: Diagnosis not present

## 2020-08-31 DIAGNOSIS — E782 Mixed hyperlipidemia: Secondary | ICD-10-CM | POA: Diagnosis not present

## 2020-08-31 DIAGNOSIS — Z043 Encounter for examination and observation following other accident: Secondary | ICD-10-CM | POA: Diagnosis not present

## 2020-08-31 MED ORDER — PANTOPRAZOLE SODIUM 40 MG PO TBEC: DELAYED_RELEASE_TABLET | ORAL | 3 refills | Status: DC

## 2020-08-31 MED ORDER — OXYBUTYNIN CHLORIDE 5 MG PO TABS: 10.0000 mg | ORAL_TABLET | Freq: Two times a day (BID) | ORAL | 3 refills | Status: DC

## 2020-08-31 MED ORDER — FUROSEMIDE 20 MG PO TABS: 20.0000 mg | ORAL_TABLET | Freq: Every day | ORAL | 1 refills | Status: DC | PRN

## 2020-08-31 MED ORDER — LOSARTAN POTASSIUM-HCTZ 100-12.5 MG PO TABS: 1.0000 | ORAL_TABLET | Freq: Every day | ORAL | 3 refills | Status: DC

## 2020-08-31 MED ORDER — DULOXETINE HCL 30 MG PO CPEP: 30.0000 mg | ORAL_CAPSULE | Freq: Every day | ORAL | 3 refills | Status: DC

## 2020-08-31 NOTE — Progress Notes (Signed)
Subjective: CC: Follow-up hypothyroidism, hypertension, overactive bladder PCP: Janora Norlander, DO QQV:[id number] Stacie Russell is a 70 y.o. female presenting to clinic today for:  1.  Hypothyroidism Patient is compliant with Synthroid 50 mcg daily.  No reports of tremor, heart palpitations or change in bowel habit  2.  Hypertension Patient is compliant with Hyzaar 100-12.5 mg daily.  No reports of chest pain, shortness of breath   3.  Overactive bladder Patient with ongoing symptoms of overactive bladder.  [name] worked best for her but is very expensive even with her insurance.  Cost was over thousand dollars.  The oxybutynin 5 mg, which she takes 10 twice daily of is the only thing that is affordable.  4.  Fall Patient reports that she sustained a fall about 2 weeks ago in [place].  She reports right and left rib pain.  The left rib pain is more inferior where the right rib pain is under her breast.  She has not felt or seen any abnormalities but she does feel that she is not able to cough well due to the pain.  ROS: Per HPI  No Known Allergies Past Medical History:  Diagnosis Date  . Arthritis   . Asthma   . Diverticulosis   . Endometrial hyperplasia   . Esophageal stricture   . Foot pain   . Fundic gland polyps of stomach, benign 07/10/2015  . Gastritis   . GERD (gastroesophageal reflux disease)   . Hemorrhoid   . Hiatal hernia   . Hip pain   . History of kidney stones    cystoscopy  . History of surgery on arm    left humerus  . Hyperlipidemia   . Hypertension   . Hypothyroidism   . Knee pain   . Metabolic syndrome   . Obstructive sleep apnea 08/27/2017  . Oxygen deficiency    pt denies  . PONV (postoperative nausea and vomiting)   . Tendonitis of ankle   . Vitamin D deficiency     Current Outpatient Medications:  .  acetaminophen (TYLENOL) 500 MG tablet, Take 500 mg by mouth every 6 (six) hours as needed for moderate pain. , Disp: , Rfl:  .  albuterol  (PROVENTIL HFA;VENTOLIN HFA) 108 (90 Base) MCG/ACT inhaler, Inhale 2 puffs into the lungs every 6 (six) hours as needed for wheezing or shortness of breath., Disp: , Rfl:  .  aspirin EC 81 MG tablet, Take 81 mg by mouth daily., Disp: , Rfl:  .  budesonide-formoterol (SYMBICORT) 80-4.5 MCG/ACT inhaler, Inhale 2 puffs into the lungs 2 (two) times daily. (Patient taking differently: Inhale 2 puffs into the lungs 2 (two) times daily as needed (sob/wheezing). ), Disp: 1 Inhaler, Rfl: 0 .  calcium carbonate (OSCAL) 1500 (600 Ca) MG TABS tablet, Take 600 mg of elemental calcium by mouth 2 (two) times daily with a meal. 600 mg in am and 900 mg at night, Disp: , Rfl:  .  calcium carbonate (TUMS - DOSED IN MG ELEMENTAL CALCIUM) 500 MG chewable tablet, Chew 1 tablet by mouth daily., Disp: , Rfl:  .  cefdinir (OMNICEF) 300 MG capsule, Take 1 capsule (300 mg total) by mouth 2 (two) times daily. 1 po BID, Disp: 20 capsule, Rfl: 0 .  Cholecalciferol (VITAMIN D3) 2000 units TABS, Take 1 tablet by mouth daily., Disp: , Rfl:  .  docusate sodium (COLACE) 100 MG capsule, Take 100 mg by mouth daily., Disp: , Rfl:  .  DULoxetine (CYMBALTA) 30 MG  capsule, Take 1 capsule (30 mg total) by mouth daily., Disp: 90 capsule, Rfl: 3 .  furosemide (LASIX) 20 MG tablet, TAKE 1 TABLET BY MOUTH  DAILY AS NEEDED (Patient taking differently: Take 20 mg by mouth daily as needed for fluid. ), Disp: 90 tablet, Rfl: 1 .  levothyroxine (SYNTHROID) 50 MCG tablet, TAKE 1 TABLET BY MOUTH  DAILY BEFORE BREAKFAST, Disp: 90 tablet, Rfl: 3 .  linaclotide (LINZESS) 145 MCG CAPS capsule, Take 1 capsule (145 mcg total) by mouth daily before breakfast., Disp: 8 capsule, Rfl: 0 .  losartan-hydrochlorothiazide (HYZAAR) 100-12.5 MG tablet, Take 1 tablet by mouth daily., Disp: 90 tablet, Rfl: 3 .  Omega-3 Fatty Acids (FISH OIL) 1000 MG CAPS, Take 1 capsule by mouth every evening. , Disp: , Rfl:  .  oxybutynin (DITROPAN) 5 MG tablet, Take 2 tablets (10 mg  total) by mouth 2 (two) times daily., Disp: 360 tablet, Rfl: 3 .  pantoprazole (PROTONIX) 40 MG tablet, TAKE 1 TABLET BY MOUTH ONCE DAILY BEFORE BREAKFAST, Disp: 90 tablet, Rfl: 0 Social History   Socioeconomic History  . Marital status: Married    Spouse name: Marcello Moores  . Number of children: 2  . Years of education: 65  . Highest education level: Associate degree: academic program  Occupational History  . Occupation: Retired  Tobacco Use  . Smoking status: Never Smoker  . Smokeless tobacco: Never Used  Vaping Use  . Vaping Use: Never used  Substance and Sexual Activity  . Alcohol use: No  . Drug use: No  . Sexual activity: Not Currently    Birth control/protection: Post-menopausal  Other Topics Concern  . Not on file  Social History Narrative   Married and retired   No EtOH, tobacco or drug use   Social Determinants of Health   Financial Resource Strain: Low Risk   . Difficulty of Paying Living Expenses: Not hard at all  Food Insecurity: No Food Insecurity  . Worried About Charity fundraiser in the Last Year: Never true  . Ran Out of Food in the Last Year: Never true  Transportation Needs: No Transportation Needs  . Lack of Transportation (Medical): No  . Lack of Transportation (Non-Medical): No  Physical Activity: Not on file  Stress: Not on file  Social Connections: Socially Integrated  . Frequency of Communication with Friends and Family: More than three times a week  . Frequency of Social Gatherings with Friends and Family: More than three times a week  . Attends Religious Services: More than 4 times per year  . Active Member of Clubs or Organizations: Yes  . Attends Archivist Meetings: More than 4 times per year  . Marital Status: Married  Human resources officer Violence: Not on file   Family History  Problem Relation Age of Onset  . Cancer Mother        Liver, stomach, colon  . Colon cancer Mother   . Heart attack Father   . Heart disease Sister   .  Breast cancer Sister   . Breast cancer Sister   . Esophageal cancer Neg Hx   . Stomach cancer Neg Hx   . Rectal cancer Neg Hx     Objective: Office vital signs reviewed. BP (!) 157/75   Pulse (!) 52   Temp (!) 97.4 F (36.3 C) (Temporal)   Ht 5' 1"  (1.549 m)   Wt 233 lb (105.7 kg)   SpO2 98%   BMI 44.02 kg/m   Physical Examination:  General: Awake, alert, well nourished, No acute distress HEENT: Normal; no exophthalmos.  No goiter.  No carotid bruits Cardio: regular rate and rhythm, S1S2 heard, no murmurs appreciated Pulm: clear to auscultation bilaterally, no wheezes, rhonchi or rales; normal work of breathing on room air Chest: No appreciable ecchymosis or palpable bony abnormalities.  She is tender along the costochondral attachment at about the ribs 7 to rib 8 on the right and along the anterior lateral aspect of rib 10 on the left  Assessment/ Plan: 71 y.o. female   Acquired hypothyroidism - Plan: Thyroid Panel With TSH  Essential hypertension, benign - Plan: CMP14+EGFR  Mixed hyperlipidemia - Plan: CMP14+EGFR, Lipid Panel  Fall, initial encounter - Plan: DG Ribs Bilateral W/Chest, CANCELED: XR Ribs Bilateral W/Chest  Pain in rib - Plan: DG Ribs Bilateral W/Chest, CANCELED: XR Ribs Bilateral W/Chest  Overactive bladder - Plan: oxybutynin (DITROPAN) 5 MG tablet  Encounter for immunization - Plan: Moderna Covid-19 Booster  Need for immunization against influenza - Plan: Flu Vaccine QUAD High Dose(Fluad)  Asymptomatic from a thyroid standpoint.  Check thyroid level  Continue current regimen for blood pressure control. Check lipid panel  There were no appreciable bony abnormalities on exam but she did have tenderness out of proportion to exam bilaterally.  Her lung exam was unremarkable  Samples of Myrbetriq 25 mg daily were given to the patient today until she gets her mail order in for her overactive bladder medication  Both influenza and COVID booster were  administered today  No orders of the defined types were placed in this encounter.  No orders of the defined types were placed in this encounter.    Janora Norlander, DO [place] [phone]

## 2020-08-31 NOTE — Progress Notes (Signed)
   Covid-19 Vaccination Clinic  Name:  Stacie Russell    MRN: [id number] DOB: [birth year]  08/31/2020  Stacie Russell was observed post Covid-19 immunization for 15 minutes without incident. She was provided with Vaccine Information Sheet and instruction to access the V-Safe system.   Stacie Russell was instructed to call 911 with any severe reactions post vaccine: Marland Kitchen Difficulty breathing  . Swelling of face and throat  . A fast heartbeat  . A bad rash all over body  . Dizziness and weakness

## 2020-08-31 NOTE — Patient Instructions (Signed)
Myrbetriq daily until Oxybutinin comes in.  Schedule appt with Almyra Free ASAP for Symbicort assistance

## 2020-09-01 ENCOUNTER — Ambulatory Visit (INDEPENDENT_AMBULATORY_CARE_PROVIDER_SITE_OTHER): Payer: Medicare Other | Admitting: Pharmacist

## 2020-09-01 DIAGNOSIS — J4530 Mild persistent asthma, uncomplicated: Secondary | ICD-10-CM

## 2020-09-01 LAB — CMP14+EGFR
ALT: 20 IU/L (ref 0–32)
AST: 19 IU/L (ref 0–40)
Albumin/Globulin Ratio: 1.8 (ref 1.2–2.2)
Albumin: 4.7 g/dL (ref 3.7–4.7)
Alkaline Phosphatase: 135 IU/L — ABNORMAL HIGH (ref 44–121)
BUN/Creatinine Ratio: 20 (ref 12–28)
BUN: 15 mg/dL (ref 8–27)
Bilirubin Total: 0.5 mg/dL (ref 0.0–1.2)
CO2: 22 mmol/L (ref 20–29)
Calcium: 10.1 mg/dL (ref 8.7–10.3)
Chloride: 102 mmol/L (ref 96–106)
Creatinine, Ser: 0.76 mg/dL (ref 0.57–1.00)
GFR calc Af Amer: 91 mL/min/{1.73_m2} (ref >59)
GFR calc non Af Amer: 79 mL/min/{1.73_m2} (ref >59)
Globulin, Total: 2.6 g/dL (ref 1.5–4.5)
Glucose: 84 mg/dL (ref 65–99)
Potassium: 4.7 mmol/L (ref 3.5–5.2)
Sodium: 141 mmol/L (ref 134–144)
Total Protein: 7.3 g/dL (ref 6.0–8.5)

## 2020-09-01 LAB — LIPID PANEL
Chol/HDL Ratio: 3.5 ratio (ref 0.0–4.4)
Cholesterol, Total: 223 mg/dL — ABNORMAL HIGH (ref 100–199)
HDL: 64 mg/dL (ref >39)
LDL Chol Calc (NIH): 142 mg/dL — ABNORMAL HIGH (ref 0–99)
Triglycerides: 94 mg/dL (ref 0–149)
VLDL Cholesterol Cal: 17 mg/dL (ref 5–40)

## 2020-09-01 LAB — THYROID PANEL WITH TSH
Free Thyroxine Index: 1.6 (ref 1.2–4.9)
T3 Uptake Ratio: 24 % (ref 24–39)
T4, Total: 6.5 ug/dL (ref 4.5–12.0)
TSH: 7.27 u[IU]/mL — ABNORMAL HIGH (ref 0.450–4.500)

## 2020-09-01 MED ORDER — BUDESONIDE-FORMOTEROL FUMARATE 80-4.5 MCG/ACT IN AERO: 2.0000 | INHALATION_SPRAY | Freq: Two times a day (BID) | RESPIRATORY_TRACT | 11 refills | Status: DC

## 2020-09-01 NOTE — Progress Notes (Signed)
HPI Patient presents today to see pharmacy team for inhaler education/medication assistance.  Past medical history includes  Past Medical History:  Diagnosis Date  . Arthritis   . Asthma   . Diverticulosis   . Endometrial hyperplasia   . Esophageal stricture   . Foot pain   . Fundic gland polyps of stomach, benign 07/10/2015  . Gastritis   . GERD (gastroesophageal reflux disease)   . Hemorrhoid   . Hiatal hernia   . Hip pain   . History of kidney stones    cystoscopy  . History of surgery on arm    left humerus  . Hyperlipidemia   . Hypertension   . Hypothyroidism   . Knee pain   . Metabolic syndrome   . Obstructive sleep apnea 08/27/2017  . Oxygen deficiency    pt denies  . PONV (postoperative nausea and vomiting)   . Tendonitis of ankle   . Vitamin D deficiency    Patient visit virtually in the context of Covid-19 pandemic.   I connected with Stacie Russell  on 09/01/20 at 3pm by video and verified that I am speaking with the correct person using two identifiers.   I discussed the limitations, risks, security and privacy concerns of performing an evaluation and management service by video and the availability of in person appointments. I also discussed with the patient that there may be a patient responsible charge related to this service. The patient expressed understanding and agreed to proceed.   Patient location:  home My Location:  home Persons on the video call:  2  Number of hospitalizations in past year: 0 Number of asthma exacerbations in past year:  0  Respiratory Medications Current: symbicort Tried in past: albuterol Patient reports financial  OBJECTIVE No Known Allergies  Outpatient Encounter Medications as of 09/01/2020  Medication Sig  . acetaminophen (TYLENOL) 500 MG tablet Take 500 mg by mouth every 6 (six) hours as needed for moderate pain.   Marland Kitchen albuterol (PROVENTIL HFA;VENTOLIN HFA) 108 (90 Base) MCG/ACT inhaler Inhale 2 puffs into the  lungs every 6 (six) hours as needed for wheezing or shortness of breath.  Marland Kitchen aspirin EC 81 MG tablet Take 81 mg by mouth daily.  . budesonide-formoterol (SYMBICORT) 80-4.5 MCG/ACT inhaler Inhale 2 puffs into the lungs 2 (two) times daily.  . calcium carbonate (OSCAL) 1500 (600 Ca) MG TABS tablet Take 600 mg of elemental calcium by mouth 2 (two) times daily with a meal. 600 mg in am and 900 mg at night  . calcium carbonate (TUMS - DOSED IN MG ELEMENTAL CALCIUM) 500 MG chewable tablet Chew 1 tablet by mouth daily.  . Cholecalciferol (VITAMIN D3) 2000 units TABS Take 1 tablet by mouth daily.  Marland Kitchen docusate sodium (COLACE) 100 MG capsule Take 100 mg by mouth daily.  . DULoxetine (CYMBALTA) 30 MG capsule Take 1 capsule (30 mg total) by mouth daily.  . furosemide (LASIX) 20 MG tablet Take 1 tablet (20 mg total) by mouth daily as needed for fluid.  Marland Kitchen levothyroxine (SYNTHROID) 50 MCG tablet TAKE 1 TABLET BY MOUTH  DAILY BEFORE BREAKFAST  . losartan-hydrochlorothiazide (HYZAAR) 100-12.5 MG tablet Take 1 tablet by mouth daily.  . Omega-3 Fatty Acids (FISH OIL) 1000 MG CAPS Take 1 capsule by mouth every evening.   Marland Kitchen oxybutynin (DITROPAN) 5 MG tablet Take 2 tablets (10 mg total) by mouth 2 (two) times daily.  . pantoprazole (PROTONIX) 40 MG tablet TAKE 1 TABLET BY MOUTH ONCE  DAILY BEFORE BREAKFAST  . [DISCONTINUED] budesonide-formoterol (SYMBICORT) 80-4.5 MCG/ACT inhaler Inhale 2 puffs into the lungs 2 (two) times daily. (Patient taking differently: No sig reported)   No facility-administered encounter medications on file as of 09/01/2020.     Immunization History  Administered Date(s) Administered  . Fluad Quad(high Dose 65+) 06/16/2019, 08/31/2020  . Influenza Whole 07/25/2012  . Influenza, High Dose Seasonal PF 06/11/2017, 06/25/2018  . Influenza,inj,Quad PF,6+ Mos 05/06/2013, 06/25/2014, 06/20/2015  . Moderna SARS-COV2 Booster Vaccination 08/31/2020  . Moderna Sars-Covid-2 Vaccination 09/14/2019,  10/05/2019  . Pneumococcal Conjugate-13 05/06/2013  . Pneumococcal Polysaccharide-23 09/05/2016  . Tdap 04/26/2011  . Zoster 04/26/2011     PFTs No flowsheet data found.   Eosinophils Most recent blood eosinophil count was 4 cells/microL taken on 03/01/20.   IgE   Assessment   1. Inhaler Optimization  Optimal inhaler for patient would be symbicort--patient states she is needing to use inhaler more often.  She was not taking fully prescribed dose.  Patient was counseled on the purpose, proper use, and adverse effects of symbicort inhaler.  Instructed patient to rinse mouth with water after using in order to prevent fungal infection.  Patient verbalized understanding.  Reviewed appropriate use of maintenance vs rescue inhalers.  Stressed importance of using maintenance inhaler daily and rescue inhaler only as needed.  Patient verbalized understanding.  2. Medication Reconciliation  A drug regimen assessment was performed, including review of allergies, interactions, disease-state management, dosing and immunization history. Medications were reviewed with the patient, including name, instructions, indication, goals of therapy, potential side effects, importance of adherence, and safe use.  Drug interaction(s): n/a  3. Immunizations  Patient is indicated for the influenzae, pneumonia, and shingles vaccinations.  PLAN -Continue on Symbicort 80/4.50mcg/act--2 puffs twice daily (rinse mouth after use) - Application completed for [place] and [place] patient assistance program for Symbicort for 6389  Application filled out online  Medication will ship to patient's home  RX escribed to [facility] with 3 refills   All questions encouraged and answered.  Instructed patient to reach out with any further questions or concerns.  Thank you for allowing pharmacy to participate in this patient's care.  This appointment required 20 minutes of patient care (this includes precharting, chart review,  review of results, etc.).  [name], PharmD, BCPS Clinical Pharmacist, [place]  [facility] Phone [phone]

## 2020-09-06 ENCOUNTER — Encounter: Payer: Self-pay | Admitting: *Deleted

## 2020-09-06 ENCOUNTER — Telehealth (INDEPENDENT_AMBULATORY_CARE_PROVIDER_SITE_OTHER): Payer: Medicare Other | Admitting: *Deleted

## 2020-09-06 DIAGNOSIS — Z20822 Contact with and (suspected) exposure to covid-19: Secondary | ICD-10-CM

## 2020-09-06 DIAGNOSIS — J4521 Mild intermittent asthma with (acute) exacerbation: Secondary | ICD-10-CM | POA: Diagnosis not present

## 2020-09-06 MED ORDER — ALBUTEROL SULFATE HFA 108 (90 BASE) MCG/ACT IN AERS: 2.0000 | INHALATION_SPRAY | Freq: Four times a day (QID) | RESPIRATORY_TRACT | 1 refills | Status: AC | PRN

## 2020-09-06 MED ORDER — AZITHROMYCIN 250 MG PO TABS: ORAL_TABLET | ORAL | 0 refills | Status: DC

## 2020-09-06 MED ORDER — PREDNISONE 20 MG PO TABS: 40.0000 mg | ORAL_TABLET | Freq: Every day | ORAL | 0 refills | Status: AC

## 2020-09-06 NOTE — Telephone Encounter (Signed)
Pt is having a cough, started after OV 1/19. It is getting worse. Was tested yesterday for covid - waiting on results.  She has a lot of wheezing and coughing.  She wonders if you may work her in for a tele visit, or call in a rescue inhaler for her.

## 2020-09-06 NOTE — Telephone Encounter (Signed)
Done. See televisit note

## 2020-09-06 NOTE — Telephone Encounter (Signed)
Telephone visit  Subjective: CC: cough PCP: Janora Norlander, DO JEH:UDJSHF Stacie Russell is a 70 y.o. female calls for telephone consult today. Patient provides verbal consent for consult held via phone.  Due to COVID-19 pandemic this visit was conducted virtually. This visit type was conducted due to national recommendations for restrictions regarding the COVID-19 Pandemic (e.g. social distancing, sheltering in place) in an effort to limit this patient's exposure and mitigate transmission in our community. All issues noted in this document were discussed and addressed.  A physical exam was not performed with this format.   Location of patient: home Location of provider: [facility] Others present for call: none  1. Cough Patient reports that cough worsened and she is having wheezing.  She reports symptoms worsened on Saturday.  She got COVID tested in [place] yesterday.  She reports productive cough with clear phlegm.  No nausea, vomiting, diarrhea.  She reports chills, night sweats.  She took some left over low dose prednisone for last 3 days.    ROS: Per HPI  No Known Allergies Past Medical History:  Diagnosis Date  . Arthritis   . Asthma   . Diverticulosis   . Endometrial hyperplasia   . Esophageal stricture   . Foot pain   . Fundic gland polyps of stomach, benign 07/10/2015  . Gastritis   . GERD (gastroesophageal reflux disease)   . Hemorrhoid   . Hiatal hernia   . Hip pain   . History of kidney stones    cystoscopy  . History of surgery on arm    left humerus  . Hyperlipidemia   . Hypertension   . Hypothyroidism   . Knee pain   . Metabolic syndrome   . Obstructive sleep apnea 08/27/2017  . Oxygen deficiency    pt denies  . PONV (postoperative nausea and vomiting)   . Tendonitis of ankle   . Vitamin D deficiency     Current Outpatient Medications:  .  acetaminophen (TYLENOL) 500 MG tablet, Take 500 mg by mouth every 6 (six) hours as needed for moderate pain. ,  Disp: , Rfl:  .  albuterol (PROVENTIL HFA;VENTOLIN HFA) 108 (90 Base) MCG/ACT inhaler, Inhale 2 puffs into the lungs every 6 (six) hours as needed for wheezing or shortness of breath., Disp: , Rfl:  .  aspirin EC 81 MG tablet, Take 81 mg by mouth daily., Disp: , Rfl:  .  budesonide-formoterol (SYMBICORT) 80-4.5 MCG/ACT inhaler, Inhale 2 puffs into the lungs 2 (two) times daily., Disp: 4 each, Rfl: 11 .  calcium carbonate (OSCAL) 1500 (600 Ca) MG TABS tablet, Take 600 mg of elemental calcium by mouth 2 (two) times daily with a meal. 600 mg in am and 900 mg at night, Disp: , Rfl:  .  calcium carbonate (TUMS - DOSED IN MG ELEMENTAL CALCIUM) 500 MG chewable tablet, Chew 1 tablet by mouth daily., Disp: , Rfl:  .  Cholecalciferol (VITAMIN D3) 2000 units TABS, Take 1 tablet by mouth daily., Disp: , Rfl:  .  docusate sodium (COLACE) 100 MG capsule, Take 100 mg by mouth daily., Disp: , Rfl:  .  DULoxetine (CYMBALTA) 30 MG capsule, Take 1 capsule (30 mg total) by mouth daily., Disp: 90 capsule, Rfl: 3 .  furosemide (LASIX) 20 MG tablet, Take 1 tablet (20 mg total) by mouth daily as needed for fluid., Disp: 90 tablet, Rfl: 1 .  levothyroxine (SYNTHROID) 50 MCG tablet, TAKE 1 TABLET BY MOUTH  DAILY BEFORE BREAKFAST, Disp: 90 tablet,  Rfl: 3 .  losartan-hydrochlorothiazide (HYZAAR) 100-12.5 MG tablet, Take 1 tablet by mouth daily., Disp: 90 tablet, Rfl: 3 .  Omega-3 Fatty Acids (FISH OIL) 1000 MG CAPS, Take 1 capsule by mouth every evening. , Disp: , Rfl:  .  oxybutynin (DITROPAN) 5 MG tablet, Take 2 tablets (10 mg total) by mouth 2 (two) times daily., Disp: 360 tablet, Rfl: 3 .  pantoprazole (PROTONIX) 40 MG tablet, TAKE 1 TABLET BY MOUTH ONCE DAILY BEFORE BREAKFAST, Disp: 90 tablet, Rfl: 3  Assessment/ Plan: 70 y.o. female   Suspected COVID-19 virus infection  Mild intermittent asthma with exacerbation  She is already been Covid tested and is awaiting results.  We discussed that if she gets the results in  the next 24 hours and she is in fact positive we will plan to treat with antivirals given her risk.  Outside of the next 24 hours she will be outside of the 5-day.  To treat with these antivirals  In the interim I will treat her symptomatically and have treated her for an asthma exacerbation.  A Z-Pak has been provided to utilize if symptoms worsen for any reason over the next day or so.  She will contact me if she proceeds with a Z-Pak.  We discussed red flag signs and symptoms warranting further evaluation in the office.  She voiced good understanding.  Start time: 12:22pm End time: 12:33pm  Total time spent on patient care (including telephone call/ virtual visit): 11 minutes  [place], [place] [phone]

## 2020-09-08 ENCOUNTER — Telehealth: Payer: Self-pay | Admitting: *Deleted

## 2020-09-08 NOTE — Telephone Encounter (Signed)
Pt called back today:  Her Covid was Negative. She states her bronchitis is not any better - possibly worse today. Fever is still off and on  She had the zpak on hand you gave her - I tolf her it may be a good idea to go ahead and start.  Any other suggestions? (she is aware you are off today)

## 2020-09-09 NOTE — Telephone Encounter (Signed)
Proceed with Zpak. Continue supportive care recommendations as we discussed. Follow up if red flags arise.

## 2020-09-09 NOTE — Telephone Encounter (Signed)
Patient aware of recommendations and will call office back if having any worsening symptoms

## 2020-09-20 ENCOUNTER — Telehealth (INDEPENDENT_AMBULATORY_CARE_PROVIDER_SITE_OTHER): Payer: Medicare Other | Admitting: Family Medicine

## 2020-09-20 DIAGNOSIS — B009 Herpesviral infection, unspecified: Secondary | ICD-10-CM | POA: Diagnosis not present

## 2020-09-20 MED ORDER — VALACYCLOVIR HCL 1 G PO TABS: ORAL_TABLET | ORAL | 0 refills | Status: DC

## 2020-09-20 NOTE — Telephone Encounter (Signed)
Telephone visit  Subjective: CC: fever blister PCP: Janora Norlander, DO GYI:RSWNIO Stacie Russell is a 70 y.o. female calls for telephone consult today. Patient provides verbal consent for consult held via phone.  Due to COVID-19 pandemic this visit was conducted virtually. This visit type was conducted due to national recommendations for restrictions regarding the COVID-19 Pandemic (e.g. social distancing, sheltering in place) in an effort to limit this patient's exposure and mitigate transmission in our community. All issues noted in this document were discussed and addressed.  A physical exam was not performed with this format.   Location of patient: home Location of provider: [facility] Others present for call: spouse  1. Fever blister Patient reports that she has been having issues with multiple fever blisters on her mouth.  She has never taken medicine for this before but was told that Valtrex may be a good option for her.  Does not report any genitourinary symptoms.  She has been ill with some type of URI for the last couple of weeks and she wonders if this perhaps precipitated it.   ROS: Per HPI  No Known Allergies Past Medical History:  Diagnosis Date  . Arthritis   . Asthma   . Diverticulosis   . Endometrial hyperplasia   . Esophageal stricture   . Foot pain   . Fundic gland polyps of stomach, benign 07/10/2015  . Gastritis   . GERD (gastroesophageal reflux disease)   . Hemorrhoid   . Hiatal hernia   . Hip pain   . History of kidney stones    cystoscopy  . History of surgery on arm    left humerus  . Hyperlipidemia   . Hypertension   . Hypothyroidism   . Knee pain   . Metabolic syndrome   . Obstructive sleep apnea 08/27/2017  . Oxygen deficiency    pt denies  . PONV (postoperative nausea and vomiting)   . Tendonitis of ankle   . Vitamin D deficiency     Current Outpatient Medications:  .  acetaminophen (TYLENOL) 500 MG tablet, Take 500 mg by mouth every 6 (six)  hours as needed for moderate pain. , Disp: , Rfl:  .  albuterol (VENTOLIN HFA) 108 (90 Base) MCG/ACT inhaler, Inhale 2 puffs into the lungs every 6 (six) hours as needed for wheezing or shortness of breath., Disp: 6.7 g, Rfl: 1 .  aspirin EC 81 MG tablet, Take 81 mg by mouth daily., Disp: , Rfl:  .  azithromycin (ZITHROMAX) 250 MG tablet, Take 2 tablets today, then take 1 tablet daily until gone. Do not use until you speak with Dr Darnell Level., Disp: 6 tablet, Rfl: 0 .  budesonide-formoterol (SYMBICORT) 80-4.5 MCG/ACT inhaler, Inhale 2 puffs into the lungs 2 (two) times daily., Disp: 4 each, Rfl: 11 .  calcium carbonate (OSCAL) 1500 (600 Ca) MG TABS tablet, Take 600 mg of elemental calcium by mouth 2 (two) times daily with a meal. 600 mg in am and 900 mg at night, Disp: , Rfl:  .  calcium carbonate (TUMS - DOSED IN MG ELEMENTAL CALCIUM) 500 MG chewable tablet, Chew 1 tablet by mouth daily., Disp: , Rfl:  .  Cholecalciferol (VITAMIN D3) 2000 units TABS, Take 1 tablet by mouth daily., Disp: , Rfl:  .  docusate sodium (COLACE) 100 MG capsule, Take 100 mg by mouth daily., Disp: , Rfl:  .  DULoxetine (CYMBALTA) 30 MG capsule, Take 1 capsule (30 mg total) by mouth daily., Disp: 90 capsule, Rfl: 3 .  furosemide (LASIX) 20 MG tablet, Take 1 tablet (20 mg total) by mouth daily as needed for fluid., Disp: 90 tablet, Rfl: 1 .  levothyroxine (SYNTHROID) 50 MCG tablet, TAKE 1 TABLET BY MOUTH  DAILY BEFORE BREAKFAST, Disp: 90 tablet, Rfl: 3 .  losartan-hydrochlorothiazide (HYZAAR) 100-12.5 MG tablet, Take 1 tablet by mouth daily., Disp: 90 tablet, Rfl: 3 .  Omega-3 Fatty Acids (FISH OIL) 1000 MG CAPS, Take 1 capsule by mouth every evening. , Disp: , Rfl:  .  oxybutynin (DITROPAN) 5 MG tablet, Take 2 tablets (10 mg total) by mouth 2 (two) times daily., Disp: 360 tablet, Rfl: 3 .  pantoprazole (PROTONIX) 40 MG tablet, TAKE 1 TABLET BY MOUTH ONCE DAILY BEFORE BREAKFAST, Disp: 90 tablet, Rfl: 3  Assessment/ Plan: 70 y.o.  female   HSV-1 (herpes simplex virus 1) infection - Plan: valACYclovir (VALTREX) 1000 MG tablet  Start Valtrex 1 g twice daily for 7 days.  We discussed that if she has a recurrent flare she will take 2 g twice daily for 1 day only.  I have given her enough for 5 episodes.  Should she have ongoing recurrence, we will consider suppressive therapy  Start time: 10:49am End time: 10:59am  Total time spent on patient care (including telephone call/ virtual visit): 10 minutes  [place], [place] [phone]

## 2020-09-22 DIAGNOSIS — M65311 Trigger thumb, right thumb: Secondary | ICD-10-CM | POA: Diagnosis not present

## 2020-09-22 DIAGNOSIS — M13841 Other specified arthritis, right hand: Secondary | ICD-10-CM | POA: Diagnosis not present

## 2020-09-22 DIAGNOSIS — S42352D Displaced comminuted fracture of shaft of humerus, left arm, subsequent encounter for fracture with routine healing: Secondary | ICD-10-CM | POA: Diagnosis not present

## 2020-09-22 DIAGNOSIS — M542 Cervicalgia: Secondary | ICD-10-CM | POA: Diagnosis not present

## 2020-12-06 ENCOUNTER — Encounter: Payer: Self-pay | Admitting: Internal Medicine

## 2020-12-19 ENCOUNTER — Inpatient Hospital Stay: Payer: Medicare Other

## 2020-12-19 ENCOUNTER — Encounter: Payer: Self-pay | Admitting: Gynecologic Oncology

## 2020-12-19 ENCOUNTER — Other Ambulatory Visit: Payer: Self-pay | Admitting: Gynecologic Oncology

## 2020-12-19 ENCOUNTER — Inpatient Hospital Stay: Payer: Medicare Other | Attending: Gynecologic Oncology | Admitting: Gynecologic Oncology

## 2020-12-19 ENCOUNTER — Telehealth: Payer: Self-pay | Admitting: Oncology

## 2020-12-19 ENCOUNTER — Other Ambulatory Visit: Payer: Self-pay

## 2020-12-19 VITALS — BP 158/76 | HR 74 | Temp 97.0°F | Resp 20 | Ht 62.0 in | Wt 242.0 lb

## 2020-12-19 DIAGNOSIS — E559 Vitamin D deficiency, unspecified: Secondary | ICD-10-CM | POA: Insufficient documentation

## 2020-12-19 DIAGNOSIS — Z803 Family history of malignant neoplasm of breast: Secondary | ICD-10-CM | POA: Insufficient documentation

## 2020-12-19 DIAGNOSIS — K219 Gastro-esophageal reflux disease without esophagitis: Secondary | ICD-10-CM | POA: Diagnosis not present

## 2020-12-19 DIAGNOSIS — C541 Malignant neoplasm of endometrium: Secondary | ICD-10-CM

## 2020-12-19 DIAGNOSIS — E785 Hyperlipidemia, unspecified: Secondary | ICD-10-CM | POA: Diagnosis not present

## 2020-12-19 DIAGNOSIS — Z800 Family history of malignant neoplasm of digestive organs: Secondary | ICD-10-CM | POA: Diagnosis not present

## 2020-12-19 DIAGNOSIS — F419 Anxiety disorder, unspecified: Secondary | ICD-10-CM | POA: Insufficient documentation

## 2020-12-19 DIAGNOSIS — I10 Essential (primary) hypertension: Secondary | ICD-10-CM | POA: Diagnosis not present

## 2020-12-19 DIAGNOSIS — E7800 Pure hypercholesterolemia, unspecified: Secondary | ICD-10-CM | POA: Diagnosis not present

## 2020-12-19 DIAGNOSIS — Z79899 Other long term (current) drug therapy: Secondary | ICD-10-CM | POA: Diagnosis not present

## 2020-12-19 DIAGNOSIS — Z6841 Body Mass Index (BMI) 40.0 and over, adult: Secondary | ICD-10-CM | POA: Diagnosis not present

## 2020-12-19 DIAGNOSIS — E039 Hypothyroidism, unspecified: Secondary | ICD-10-CM | POA: Insufficient documentation

## 2020-12-19 DIAGNOSIS — Z7982 Long term (current) use of aspirin: Secondary | ICD-10-CM | POA: Diagnosis not present

## 2020-12-19 DIAGNOSIS — G4733 Obstructive sleep apnea (adult) (pediatric): Secondary | ICD-10-CM | POA: Diagnosis not present

## 2020-12-19 LAB — COMPREHENSIVE METABOLIC PANEL
ALT: 16 U/L (ref 0–44)
AST: 15 U/L (ref 15–41)
Albumin: 4 g/dL (ref 3.5–5.0)
Alkaline Phosphatase: 92 U/L (ref 38–126)
Anion gap: 11 (ref 5–15)
BUN: 17 mg/dL (ref 8–23)
CO2: 28 mmol/L (ref 22–32)
Calcium: 9.7 mg/dL (ref 8.9–10.3)
Chloride: 103 mmol/L (ref 98–111)
Creatinine, Ser: 0.77 mg/dL (ref 0.44–1.00)
GFR, Estimated: >60 mL/min (ref >60)
Glucose, Bld: 86 mg/dL (ref 70–99)
Potassium: 3.9 mmol/L (ref 3.5–5.1)
Sodium: 142 mmol/L (ref 135–145)
Total Bilirubin: 0.5 mg/dL (ref 0.3–1.2)
Total Protein: 7.4 g/dL (ref 6.5–8.1)

## 2020-12-19 MED ORDER — OXYCODONE HCL 5 MG PO TABS: 5.0000 mg | ORAL_TABLET | ORAL | 0 refills | Status: DC | PRN

## 2020-12-19 MED ORDER — SENNOSIDES-DOCUSATE SODIUM 8.6-50 MG PO TABS: 2.0000 | ORAL_TABLET | Freq: Every day | ORAL | 0 refills | Status: DC

## 2020-12-19 MED ORDER — TRAMADOL HCL 50 MG PO TABS: 50.0000 mg | ORAL_TABLET | Freq: Four times a day (QID) | ORAL | 0 refills | Status: DC | PRN

## 2020-12-19 NOTE — H&P (View-Only) (Signed)
Consult Note: Gyn-Onc  Consult was requested by Dr. Ulanda Edison for the evaluation of Stacie Russell 70 y.o. female  CC:  Chief Complaint  Patient presents with  . endometrial cancer    Assessment/Plan:  Stacie Russell  is a 70 y.o.  year old P2 with grade 3 endometrial cancer.  A detailed discussion was held with the patient with regard to to her endometrial cancer diagnosis. We discussed the standard management options for uterine cancer which includes surgery followed possibly by adjuvant therapy depending on the results of surgery. The surgical management include a robotic assisted total hysterectomy and removal of the tubes and ovaries with sampling of lymph nodes. If a minimally invasive approach is not feasible, a laparotomy may be necessary (including for specimen delivery). The patient has been counseled about these surgical options and the risks of surgery in general including infection, bleeding, damage to surrounding structures (including bowel, bladder, ureters, nerves or vessels), and the postoperative risks of PE/ DVT, and lymphedema. After counseling and consideration of her options, she is in agreement to proceed with robotic assisted total hysterectomy with bilateral sapingo-oophorectomy and SLN biopsy.   I have ordered a CT abd/pelvis to evaluate for metastatic disease given the high grade nature of her tumor.  I explained that her morbid obesity and history of prior hernia repair place her at higher risk than the average surgical patient for complications such as damage to visceral organs.  I discussed alternative to surgical intervention which would be definitive radiation.  I feel that she is an acceptable enough risk to proceed with standard of care intervention which would be surgery.  She will be seen by anesthesia for preoperative clearance and discussion of postoperative pain management.  She was given the opportunity to ask questions, which were answered to her  satisfaction, and she is agreement with the above mentioned plan of care.   We explained that robotic hysterectomy is typically an outpatient procedure with same day discharge provided that she is meeting appropriate discharge criteria from the PACU. We provided extensive counseling regarding post-operative expectations for recovery and restrictions/limitations. We provided information regarding multi-modal pain therapy and the importance of avoidance of opioids.  We explained that after surgery we will review her definitive pathology and determine if adjuvant therapy is recommended.  I explained to the patient and her husband that given her high-grade tumor and her advanced age, she is a high likelihood of requiring adjuvant therapy postoperatively.  HPI: Stacie Russell is a 70 year old P 2 who was seen in consultation at the request of Dr Ulanda Edison for evaluation and treatment of grade 3 endometrial cancer.   Her symptoms began in April 2022 with postmenopausal bleeding.  She saw Dr Ulanda Edison for a new patient visit because she had not seen a gynecologist for 8 preceding years on 12/09/2020.  Work-up of symptoms included Pap smear and endometrial biopsy. Endometrial sampling with an office [place] was performed on 12/09/2020 and showed a high grade endometrial carcinoma.  Pap smear performed at that visit showed atypical glandular cells suspicious for adenocarcinoma.  Patient is a morbidly obese patient with a BMI of 44 kg meters squared.  She has history of esophageal stricture requiring stretching and an umbilical hernia that was repaired 15 to 20 years ago (she is unclear if mesh was used).  She also has a history of hypercholesterolemia and hypertension.  She denies a history of diabetes.  Her family history is remarkable with a mother  with liver cancer at age 37 and 2 sisters who had breast cancer at age greater than 51.  She does not work outside of the home and lives with her husband.  She is  independent with all activities of daily living.  Current Meds:  Outpatient Encounter Medications as of 12/19/2020  Medication Sig  . acetaminophen (TYLENOL) 500 MG tablet Take 500 mg by mouth every 6 (six) hours as needed for moderate pain.   Marland Kitchen albuterol (VENTOLIN HFA) 108 (90 Base) MCG/ACT inhaler Inhale 2 puffs into the lungs every 6 (six) hours as needed for wheezing or shortness of breath.  . budesonide-formoterol (SYMBICORT) 80-4.5 MCG/ACT inhaler Inhale 2 puffs into the lungs 2 (two) times daily.  . calcium carbonate (TUMS - DOSED IN MG ELEMENTAL CALCIUM) 500 MG chewable tablet Chew 1 tablet by mouth daily.  . Cholecalciferol (VITAMIN D3) 2000 units TABS Take 1 tablet by mouth daily.  Marland Kitchen docusate sodium (COLACE) 100 MG capsule Take 100 mg by mouth daily.  . DULoxetine (CYMBALTA) 30 MG capsule Take 1 capsule (30 mg total) by mouth daily.  . furosemide (LASIX) 20 MG tablet Take 1 tablet (20 mg total) by mouth daily as needed for fluid.  Marland Kitchen levothyroxine (SYNTHROID) 50 MCG tablet TAKE 1 TABLET BY MOUTH  DAILY BEFORE BREAKFAST  . losartan-hydrochlorothiazide (HYZAAR) 100-12.5 MG tablet Take 1 tablet by mouth daily.  . Omega-3 Fatty Acids (FISH OIL) 1000 MG CAPS Take 1 capsule by mouth every evening.   Marland Kitchen oxybutynin (DITROPAN) 5 MG tablet Take 2 tablets (10 mg total) by mouth 2 (two) times daily.  . pantoprazole (PROTONIX) 40 MG tablet TAKE 1 TABLET BY MOUTH ONCE DAILY BEFORE BREAKFAST  . vitamin B-12 (CYANOCOBALAMIN) 1000 MCG tablet Take 1,200 mcg by mouth daily.  Marland Kitchen aspirin EC 81 MG tablet Take 81 mg by mouth daily. (Patient not taking: Reported on 12/19/2020)  . valACYclovir (VALTREX) 1000 MG tablet Take 1 tablet twice daily for 7 days.  If you have another flare, take 2 tablets twice a day for ONE day only per flareup. (Patient not taking: Reported on 12/19/2020)  . [DISCONTINUED] azithromycin (ZITHROMAX) 250 MG tablet Take 2 tablets today, then take 1 tablet daily until gone. Do not use until you  speak with Dr Darnell Level. (Patient not taking: Reported on 12/19/2020)  . [DISCONTINUED] calcium carbonate (OSCAL) 1500 (600 Ca) MG TABS tablet Take 600 mg of elemental calcium by mouth 2 (two) times daily with a meal. 600 mg in am and 900 mg at night (Patient not taking: Reported on 12/19/2020)   No facility-administered encounter medications on file as of 12/19/2020.    Allergy: No Known Allergies  Social Hx:   Social History   Socioeconomic History  . Marital status: Married    Spouse name: Marcello Moores  . Number of children: 2  . Years of education: 69  . Highest education level: Associate degree: academic program  Occupational History  . Occupation: Retired  Tobacco Use  . Smoking status: Never Smoker  . Smokeless tobacco: Never Used  Vaping Use  . Vaping Use: Never used  Substance and Sexual Activity  . Alcohol use: No  . Drug use: No  . Sexual activity: Not Currently    Birth control/protection: Post-menopausal  Other Topics Concern  . Not on file  Social History Narrative   Married and retired   No EtOH, tobacco or drug use   Social Determinants of Health   Financial Resource Strain: Low Risk   .  Difficulty of Paying Living Expenses: Not hard at all  Food Insecurity: No Food Insecurity  . Worried About Charity fundraiser in the Last Year: Never true  . Ran Out of Food in the Last Year: Never true  Transportation Needs: No Transportation Needs  . Lack of Transportation (Medical): No  . Lack of Transportation (Non-Medical): No  Physical Activity: Not on file  Stress: Not on file  Social Connections: Socially Integrated  . Frequency of Communication with Friends and Family: More than three times a week  . Frequency of Social Gatherings with Friends and Family: More than three times a week  . Attends Religious Services: More than 4 times per year  . Active Member of Clubs or Organizations: Yes  . Attends Archivist Meetings: More than 4 times per year  . Marital Status:  Married  Human resources officer Violence: Not on file    Past Surgical Hx:  Past Surgical History:  Procedure Laterality Date  . ANAL FISSURE REPAIR    . CATARACT EXTRACTION  May 2009  . COLONOSCOPY  12/26/07  . ESOPHAGEAL DILATION    . ESOPHAGOGASTRODUODENOSCOPY  12/26/07, 05/13/08  . EYE SURGERY Bilateral    bilateral cataract with lens implants  . KNEE CARTILAGE SURGERY Right Sept 1999  . ORIF HUMERUS FRACTURE Left 01/12/2020   Procedure: OPEN REDUCTION INTERNAL FIXATION (ORIF) LEFT HUMERUS FRACTURE;  Surgeon: Roseanne Kaufman, MD;  Location: [facility];  Service: Orthopedics;  Laterality: Left;  . 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 UMBILICAL HERNIA REPAIR  05/20/06    Past Medical Hx:  Past Medical History:  Diagnosis Date  . Arthritis   . Asthma   . Diverticulosis   . Endometrial hyperplasia   . Esophageal stricture   . Foot pain   . Fundic gland polyps of stomach, benign 07/10/2015  . Gastritis   . GERD (gastroesophageal reflux disease)   . Hemorrhoid   . Hiatal hernia   . Hip pain   . History of kidney stones    cystoscopy  . History of surgery on arm    left humerus  . Hyperlipidemia   . Hypertension   . Hypothyroidism   . Knee pain   . Metabolic syndrome   . Obstructive sleep apnea 08/27/2017  . Oxygen deficiency    pt denies  . PONV (postoperative nausea and vomiting)   . Tendonitis of ankle   . Vitamin D deficiency     Past Gynecological History: See HPI, 2 prior vaginal deliveries, no history of abnormal Paps. No LMP recorded. Patient is postmenopausal.  Family Hx:  Family History  Problem Relation Age of Onset  . Cancer Mother        Liver, stomach, colon  . Colon cancer Mother   . Heart attack Father   . Heart disease Sister   . Breast cancer Sister   . Breast cancer Sister   . Esophageal cancer  Neg Hx   . Stomach cancer Neg Hx   . Rectal cancer Neg Hx     Review of Systems:  Constitutional  Feels well,    ENT Normal appearing ears and nares bilaterally Skin/Breast  No rash, sores, jaundice, itching, dryness Cardiovascular  No chest pain, shortness of breath, or edema  Pulmonary  No cough or wheeze.  Gastro Intestinal  No nausea, vomitting, or  diarrhoea. No bright red blood per rectum, no abdominal pain, change in bowel movement, or constipation.  Genito Urinary  No frequency, urgency, dysuria, + postmenopausal bleeding Musculo Skeletal  No myalgia, arthralgia, joint swelling or pain  Neurologic  No weakness, numbness, change in gait,  Psychology  No depression, anxiety, insomnia.   Vitals:  Blood pressure (!) 158/76, pulse 74, temperature (!) 97 F (36.1 C), temperature source Tympanic, resp. rate 20, height 5\' 2"  (1.575 m), weight 242 lb (109.8 kg), SpO2 98 %.  Physical Exam: WD in NAD Neck  Supple NROM, without any enlargements.  Lymph Node Survey No cervical supraclavicular or inguinal adenopathy Cardiovascular   Warm well perfused peripheries Lungs  No increased WOB Skin  No rash/lesions/breakdown  Psychiatry  Alert and oriented to person, place, and time  Abdomen  Normoactive bowel sounds, abdomen soft, non-tender and obese without evidence of hernia. Back No CVA tenderness Genito Urinary  Vulva/vagina: Normal external female genitalia.   No lesions. No discharge or bleeding.  Bladder/urethra:  No lesions or masses, well supported bladder  Vagina: normal, smooth, narrow at upper vagina  Cervix: flush with upper vagina, no palpable masses  Uterus: Difficult to discern size due to body habitus but not obviously enlarged, mobile, no parametrial involvement or nodularity.  Adnexa: no palpable masses. Rectal  deferred Extremities  No bilateral cyanosis, clubbing or edema.  60 minutes of total time was spent for this patient encounter, including  preparation, face-to-face counseling with the patient and coordination of care, and documentation of the encounter.   Thereasa Solo, MD  12/19/2020, 2:00 PM

## 2020-12-19 NOTE — Progress Notes (Signed)
Consult Note: Gyn-Onc  Consult was requested by Dr. Ulanda Edison for the evaluation of Stacie Russell 70 y.o. female  CC:  Chief Complaint  Patient presents with  . endometrial cancer    Assessment/Plan:  Stacie Russell  is a 70 y.o.  year old P2 with grade 3 endometrial cancer.  A detailed discussion was held with the patient with regard to to her endometrial cancer diagnosis. We discussed the standard management options for uterine cancer which includes surgery followed possibly by adjuvant therapy depending on the results of surgery. The surgical management include a robotic assisted total hysterectomy and removal of the tubes and ovaries with sampling of lymph nodes. If a minimally invasive approach is not feasible, a laparotomy may be necessary (including for specimen delivery). The patient has been counseled about these surgical options and the risks of surgery in general including infection, bleeding, damage to surrounding structures (including bowel, bladder, ureters, nerves or vessels), and the postoperative risks of PE/ DVT, and lymphedema. After counseling and consideration of her options, she is in agreement to proceed with robotic assisted total hysterectomy with bilateral sapingo-oophorectomy and SLN biopsy.   I have ordered a CT abd/pelvis to evaluate for metastatic disease given the high grade nature of her tumor.  I explained that her morbid obesity and history of prior hernia repair place her at higher risk than the average surgical patient for complications such as damage to visceral organs.  I discussed alternative to surgical intervention which would be definitive radiation.  I feel that she is an acceptable enough risk to proceed with standard of care intervention which would be surgery.  She will be seen by anesthesia for preoperative clearance and discussion of postoperative pain management.  She was given the opportunity to ask questions, which were answered to her  satisfaction, and she is agreement with the above mentioned plan of care.   We explained that robotic hysterectomy is typically an outpatient procedure with same day discharge provided that she is meeting appropriate discharge criteria from the PACU. We provided extensive counseling regarding post-operative expectations for recovery and restrictions/limitations. We provided information regarding multi-modal pain therapy and the importance of avoidance of opioids.  We explained that after surgery we will review her definitive pathology and determine if adjuvant therapy is recommended.  I explained to the patient and her husband that given her high-grade tumor and her advanced age, she is a high likelihood of requiring adjuvant therapy postoperatively.  HPI: Stacie Russell is a 70 year old P 2 who was seen in consultation at the request of Dr Ulanda Edison for evaluation and treatment of grade 3 endometrial cancer.   Her symptoms began in April 2022 with postmenopausal bleeding.  She saw Dr Ulanda Edison for a new patient visit because she had not seen a gynecologist for 8 preceding years on 12/09/2020.  Work-up of symptoms included Pap smear and endometrial biopsy. Endometrial sampling with an office [place] was performed on 12/09/2020 and showed a high grade endometrial carcinoma.  Pap smear performed at that visit showed atypical glandular cells suspicious for adenocarcinoma.  Patient is a morbidly obese patient with a BMI of 44 kg meters squared.  She has history of esophageal stricture requiring stretching and an umbilical hernia that was repaired 15 to 20 years ago (she is unclear if mesh was used).  She also has a history of hypercholesterolemia and hypertension.  She denies a history of diabetes.  Her family history is remarkable with a mother  with liver cancer at age 37 and 2 sisters who had breast cancer at age greater than 51.  She does not work outside of the home and lives with her husband.  She is  independent with all activities of daily living.  Current Meds:  Outpatient Encounter Medications as of 12/19/2020  Medication Sig  . acetaminophen (TYLENOL) 500 MG tablet Take 500 mg by mouth every 6 (six) hours as needed for moderate pain.   Marland Kitchen albuterol (VENTOLIN HFA) 108 (90 Base) MCG/ACT inhaler Inhale 2 puffs into the lungs every 6 (six) hours as needed for wheezing or shortness of breath.  . budesonide-formoterol (SYMBICORT) 80-4.5 MCG/ACT inhaler Inhale 2 puffs into the lungs 2 (two) times daily.  . calcium carbonate (TUMS - DOSED IN MG ELEMENTAL CALCIUM) 500 MG chewable tablet Chew 1 tablet by mouth daily.  . Cholecalciferol (VITAMIN D3) 2000 units TABS Take 1 tablet by mouth daily.  Marland Kitchen docusate sodium (COLACE) 100 MG capsule Take 100 mg by mouth daily.  . DULoxetine (CYMBALTA) 30 MG capsule Take 1 capsule (30 mg total) by mouth daily.  . furosemide (LASIX) 20 MG tablet Take 1 tablet (20 mg total) by mouth daily as needed for fluid.  Marland Kitchen levothyroxine (SYNTHROID) 50 MCG tablet TAKE 1 TABLET BY MOUTH  DAILY BEFORE BREAKFAST  . losartan-hydrochlorothiazide (HYZAAR) 100-12.5 MG tablet Take 1 tablet by mouth daily.  . Omega-3 Fatty Acids (FISH OIL) 1000 MG CAPS Take 1 capsule by mouth every evening.   Marland Kitchen oxybutynin (DITROPAN) 5 MG tablet Take 2 tablets (10 mg total) by mouth 2 (two) times daily.  . pantoprazole (PROTONIX) 40 MG tablet TAKE 1 TABLET BY MOUTH ONCE DAILY BEFORE BREAKFAST  . vitamin B-12 (CYANOCOBALAMIN) 1000 MCG tablet Take 1,200 mcg by mouth daily.  Marland Kitchen aspirin EC 81 MG tablet Take 81 mg by mouth daily. (Patient not taking: Reported on 12/19/2020)  . valACYclovir (VALTREX) 1000 MG tablet Take 1 tablet twice daily for 7 days.  If you have another flare, take 2 tablets twice a day for ONE day only per flareup. (Patient not taking: Reported on 12/19/2020)  . [DISCONTINUED] azithromycin (ZITHROMAX) 250 MG tablet Take 2 tablets today, then take 1 tablet daily until gone. Do not use until you  speak with Dr Darnell Level. (Patient not taking: Reported on 12/19/2020)  . [DISCONTINUED] calcium carbonate (OSCAL) 1500 (600 Ca) MG TABS tablet Take 600 mg of elemental calcium by mouth 2 (two) times daily with a meal. 600 mg in am and 900 mg at night (Patient not taking: Reported on 12/19/2020)   No facility-administered encounter medications on file as of 12/19/2020.    Allergy: No Known Allergies  Social Hx:   Social History   Socioeconomic History  . Marital status: Married    Spouse name: Marcello Moores  . Number of children: 2  . Years of education: 69  . Highest education level: Associate degree: academic program  Occupational History  . Occupation: Retired  Tobacco Use  . Smoking status: Never Smoker  . Smokeless tobacco: Never Used  Vaping Use  . Vaping Use: Never used  Substance and Sexual Activity  . Alcohol use: No  . Drug use: No  . Sexual activity: Not Currently    Birth control/protection: Post-menopausal  Other Topics Concern  . Not on file  Social History Narrative   Married and retired   No EtOH, tobacco or drug use   Social Determinants of Health   Financial Resource Strain: Low Risk   .  Difficulty of Paying Living Expenses: Not hard at all  Food Insecurity: No Food Insecurity  . Worried About Charity fundraiser in the Last Year: Never true  . Ran Out of Food in the Last Year: Never true  Transportation Needs: No Transportation Needs  . Lack of Transportation (Medical): No  . Lack of Transportation (Non-Medical): No  Physical Activity: Not on file  Stress: Not on file  Social Connections: Socially Integrated  . Frequency of Communication with Friends and Family: More than three times a week  . Frequency of Social Gatherings with Friends and Family: More than three times a week  . Attends Religious Services: More than 4 times per year  . Active Member of Clubs or Organizations: Yes  . Attends Archivist Meetings: More than 4 times per year  . Marital Status:  Married  Human resources officer Violence: Not on file    Past Surgical Hx:  Past Surgical History:  Procedure Laterality Date  . ANAL FISSURE REPAIR    . CATARACT EXTRACTION  May 2009  . COLONOSCOPY  12/26/07  . ESOPHAGEAL DILATION    . ESOPHAGOGASTRODUODENOSCOPY  12/26/07, 05/13/08  . EYE SURGERY Bilateral    bilateral cataract with lens implants  . KNEE CARTILAGE SURGERY Right Sept 1999  . ORIF HUMERUS FRACTURE Left 01/12/2020   Procedure: OPEN REDUCTION INTERNAL FIXATION (ORIF) LEFT HUMERUS FRACTURE;  Surgeon: Roseanne Kaufman, MD;  Location: [facility];  Service: Orthopedics;  Laterality: Left;  . 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 UMBILICAL HERNIA REPAIR  05/20/06    Past Medical Hx:  Past Medical History:  Diagnosis Date  . Arthritis   . Asthma   . Diverticulosis   . Endometrial hyperplasia   . Esophageal stricture   . Foot pain   . Fundic gland polyps of stomach, benign 07/10/2015  . Gastritis   . GERD (gastroesophageal reflux disease)   . Hemorrhoid   . Hiatal hernia   . Hip pain   . History of kidney stones    cystoscopy  . History of surgery on arm    left humerus  . Hyperlipidemia   . Hypertension   . Hypothyroidism   . Knee pain   . Metabolic syndrome   . Obstructive sleep apnea 08/27/2017  . Oxygen deficiency    pt denies  . PONV (postoperative nausea and vomiting)   . Tendonitis of ankle   . Vitamin D deficiency     Past Gynecological History: See HPI, 2 prior vaginal deliveries, no history of abnormal Paps. No LMP recorded. Patient is postmenopausal.  Family Hx:  Family History  Problem Relation Age of Onset  . Cancer Mother        Liver, stomach, colon  . Colon cancer Mother   . Heart attack Father   . Heart disease Sister   . Breast cancer Sister   . Breast cancer Sister   . Esophageal cancer  Neg Hx   . Stomach cancer Neg Hx   . Rectal cancer Neg Hx     Review of Systems:  Constitutional  Feels well,    ENT Normal appearing ears and nares bilaterally Skin/Breast  No rash, sores, jaundice, itching, dryness Cardiovascular  No chest pain, shortness of breath, or edema  Pulmonary  No cough or wheeze.  Gastro Intestinal  No nausea, vomitting, or  diarrhoea. No bright red blood per rectum, no abdominal pain, change in bowel movement, or constipation.  Genito Urinary  No frequency, urgency, dysuria, + postmenopausal bleeding Musculo Skeletal  No myalgia, arthralgia, joint swelling or pain  Neurologic  No weakness, numbness, change in gait,  Psychology  No depression, anxiety, insomnia.   Vitals:  Blood pressure (!) 158/76, pulse 74, temperature (!) 97 F (36.1 C), temperature source Tympanic, resp. rate 20, height 5\' 2"  (1.575 m), weight 242 lb (109.8 kg), SpO2 98 %.  Physical Exam: WD in NAD Neck  Supple NROM, without any enlargements.  Lymph Node Survey No cervical supraclavicular or inguinal adenopathy Cardiovascular   Warm well perfused peripheries Lungs  No increased WOB Skin  No rash/lesions/breakdown  Psychiatry  Alert and oriented to person, place, and time  Abdomen  Normoactive bowel sounds, abdomen soft, non-tender and obese without evidence of hernia. Back No CVA tenderness Genito Urinary  Vulva/vagina: Normal external female genitalia.   No lesions. No discharge or bleeding.  Bladder/urethra:  No lesions or masses, well supported bladder  Vagina: normal, smooth, narrow at upper vagina  Cervix: flush with upper vagina, no palpable masses  Uterus: Difficult to discern size due to body habitus but not obviously enlarged, mobile, no parametrial involvement or nodularity.  Adnexa: no palpable masses. Rectal  deferred Extremities  No bilateral cyanosis, clubbing or edema.  60 minutes of total time was spent for this patient encounter, including  preparation, face-to-face counseling with the patient and coordination of care, and documentation of the encounter.   Thereasa Solo, MD  12/19/2020, 2:00 PM

## 2020-12-19 NOTE — Telephone Encounter (Signed)
[facility] and canceled Tramadol prescription per Joylene John, NP.

## 2020-12-19 NOTE — Patient Instructions (Addendum)
Preparing for your Surgery  Plan for surgery on Dec 29, 2020 with Dr. Everitt Amber at [facility] will be scheduled for a robotic assisted total laparoscopic hysterectomy (removal of the uterus and cervix), bilateral salpingo-oophorectomy (removal of both ovaries and fallopian tubes), sentinel lymph node biopsy, possible lymph node dissection.   Pre-operative Testing -You will receive a phone call from presurgical testing at [facility] to arrange for a pre-operative appointment, labs, and COVID test. The COVID test normally happens 3 days prior to the surgery and they ask that you self quarantine after the test up until surgery to decrease chance of exposure.  -Bring your insurance card, copy of an advanced directive if applicable, medication list  -At that visit, you will be asked to sign a consent for a possible blood transfusion in case a transfusion becomes necessary during surgery.  The need for a blood transfusion is rare but having consent is a necessary part of your care.     -DO NOT RESUME YOUR ASPIRIN. You should not be taking blood thinners or aspirin at least ten days prior to surgery unless instructed by your surgeon.  -STOP FISH OIL NOW. Do not take supplements such as fish oil (omega 3), red yeast rice, turmeric before your surgery. You want to avoid medications with aspirin in them including headache powders such as BC or Goody's), Excedrin migraine.  Day Before Surgery at [facility] will be asked to take in a light diet the day before surgery. You will be advised you can have clear liquids up until 3 hours before your surgery.    Eat a light diet the day before surgery.  Examples including soups, broths, toast, yogurt, mashed potatoes.  AVOID GAS PRODUCING FOODS. Things to avoid include carbonated beverages (fizzy beverages, sodas), raw fruits and raw vegetables (uncooked), or beans.   If your bowels are filled with gas, your surgeon will have difficulty  visualizing your pelvic organs which increases your surgical risks.  Your role in recovery Your role is to become active as soon as directed by your doctor, while still giving yourself time to heal.  Rest when you feel tired. You will be asked to do the following in order to speed your recovery:  - Cough and breathe deeply. This helps to clear and expand your lungs and can prevent pneumonia after surgery.  - [place]. Do mild physical activity. Walking or moving your legs help your circulation and body functions return to normal. Do not try to get up or walk alone the first time after surgery.   -If you develop swelling on one leg or the other, pain in the back of your leg, redness/warmth in one of your legs, please call the office or go to the Emergency Room to have a doppler to rule out a blood clot. For shortness of breath, chest pain-seek care in the Emergency Room as soon as possible. - Actively manage your pain. Managing your pain lets you move in comfort. We will ask you to rate your pain on a scale of zero to 10. It is your responsibility to tell your doctor or nurse where and how much you hurt so your pain can be treated.  Special Considerations -If you are diabetic, you may be placed on insulin after surgery to have closer control over your blood sugars to promote healing and recovery.  This does not mean that you will be discharged on insulin.  If applicable, your  oral antidiabetics will be resumed when you are tolerating a solid diet.  -Your final pathology results from surgery should be available around one week after surgery and the results will be relayed to you when available.  -Dr. Lahoma Crocker is the surgeon that assists your GYN Oncologist with surgery.  If you end up staying the night, the next day after your surgery you will either see Dr. Denman George, Dr. Berline Lopes, or Dr. Lahoma Crocker.  -FMLA forms can be faxed to [phone] and please allow 5-7  business days for completion.  Pain Management After Surgery -You have been prescribed your pain medication and bowel regimen medications before surgery so that you can have these available when you are discharged from the hospital. The pain medication is for use ONLY AFTER surgery and a new prescription will not be given.   -Make sure that you have Tylenol and Ibuprofen at home to use on a regular basis after surgery for pain control. We recommend alternating the medications every hour to six hours since they work differently and are processed in the body differently for pain relief.  -Review the attached handout on narcotic use and their risks and side effects.   Bowel Regimen -You have been prescribed Sennakot-S to take nightly to prevent constipation especially if you are taking the narcotic pain medication intermittently.  It is important to prevent constipation and drink adequate amounts of liquids. You can stop taking this medication when you are not taking pain medication and you are back on your normal bowel routine.  Risks of Surgery Risks of surgery are low but include bleeding, infection, damage to surrounding structures, re-operation, blood clots, and very rarely death.   Blood Transfusion Information (For the consent to be signed before surgery)  We will be checking your blood type before surgery so in case of emergencies, we will know what type of blood you would need.                                            WHAT IS A BLOOD TRANSFUSION?  A transfusion is the replacement of blood or some of its parts. Blood is made up of multiple cells which provide different functions.  Red blood cells carry oxygen and are used for blood loss replacement.  White blood cells fight against infection.  Platelets control bleeding.  Plasma helps clot blood.  Other blood products are available for specialized needs, such as hemophilia or other clotting disorders. BEFORE THE TRANSFUSION   Who gives blood for transfusions?   You may be able to donate blood to be used at a later date on yourself (autologous donation).  Relatives can be asked to donate blood. This is generally not any safer than if you have received blood from a stranger. The same precautions are taken to ensure safety when a relative's blood is donated.  Healthy volunteers who are fully evaluated to make sure their blood is safe. This is blood bank blood. Transfusion therapy is the safest it has ever been in the practice of medicine. Before blood is taken from a donor, a complete history is taken to make sure that person has no history of diseases nor engages in risky social behavior (examples are intravenous drug use or sexual activity with multiple partners). The donor's travel history is screened to minimize risk of transmitting infections, such as malaria. The donated blood  is tested for signs of infectious diseases, such as HIV and hepatitis. The blood is then tested to be sure it is compatible with you in order to minimize the chance of a transfusion reaction. If you or a relative donates blood, this is often done in anticipation of surgery and is not appropriate for emergency situations. It takes many days to process the donated blood. RISKS AND COMPLICATIONS Although transfusion therapy is very safe and saves many lives, the main dangers of transfusion include:   Getting an infectious disease.  Developing a transfusion reaction. This is an allergic reaction to something in the blood you were given. Every precaution is taken to prevent this. The decision to have a blood transfusion has been considered carefully by your caregiver before blood is given. Blood is not given unless the benefits outweigh the risks.  AFTER SURGERY INSTRUCTIONS  Return to work: 4 weeks if applicable  Activity: 1. Be up and out of the bed during the day.  Take a nap if needed.  You may walk up steps but be careful and use the hand  rail.  Stair climbing will tire you more than you think, you may need to stop part way and rest.   2. No lifting or straining for 6 weeks over 10 pounds. No pushing, pulling, straining for 6 weeks.  3. No driving for 1 week(s).  Do not drive if you are taking narcotic pain medicine and make sure that your reaction time has returned.   4. You can shower as soon as the next day after surgery. Shower daily.  Use your regular soap and water (not directly on the incision) and pat your incision(s) dry afterwards; don't rub.  No tub baths or submerging your body in water until cleared by your surgeon. If you have the soap that was given to you by pre-surgical testing that was used before surgery, you do not need to use it afterwards because this can irritate your incisions.   5. No sexual activity and nothing in the vagina for 8 weeks.  6. You may experience a small amount of clear drainage from your incisions, which is normal.  If the drainage persists, increases, or changes color please call the office.  7. Do not use creams, lotions, or ointments such as neosporin on your incisions after surgery until advised by your surgeon because they can cause removal of the dermabond glue on your incisions.    8. You may experience vaginal spotting after surgery or around the 6-8 week mark from surgery when the stitches at the top of the vagina begin to dissolve.  The spotting is normal but if you experience heavy bleeding, call our office.  9. Take Tylenol or ibuprofen first for pain and only use narcotic pain medication for severe pain not relieved by the Tylenol or Ibuprofen.  Monitor your Tylenol intake to a max of 4,000 mg in a 24 hour period. You can alternate these medications after surgery.  Diet: 1. Low sodium Heart Healthy Diet is recommended but you are cleared to resume your normal (before surgery) diet after your procedure.  2. It is safe to use a laxative, such as Miralax or Colace, if you have  difficulty moving your bowels. You have been prescribed Sennakot at bedtime every evening to keep bowel movements regular and to prevent constipation.    Wound Care: 1. Keep clean and dry.  Shower daily.  Reasons to call the Doctor:  Fever - Oral temperature greater than  100.4 degrees Fahrenheit  Foul-smelling vaginal discharge  Difficulty urinating  Nausea and vomiting  Increased pain at the site of the incision that is unrelieved with pain medicine.  Difficulty breathing with or without chest pain  New calf pain especially if only on one side  Sudden, continuing increased vaginal bleeding with or without clots.   Contacts: For questions or concerns you should contact:  Dr. Everitt Amber at [phone]  Joylene John, NP at [phone]  After Hours: call [phone] and have the GYN Oncologist paged/contacted (after 5 pm or on the weekends).  Messages sent via mychart are for non-urgent matters and are not responded to after hours so for urgent needs, please call the after hours number.

## 2020-12-19 NOTE — Addendum Note (Signed)
Addended by: Joylene John D on: 12/19/2020 02:47 PM   Modules accepted: Orders

## 2020-12-20 ENCOUNTER — Telehealth: Payer: Self-pay | Admitting: *Deleted

## 2020-12-20 NOTE — Telephone Encounter (Signed)
Per Dr Denman George, fax office note to Dr [facility] office

## 2020-12-23 ENCOUNTER — Other Ambulatory Visit: Payer: Self-pay | Admitting: Gynecologic Oncology

## 2020-12-23 DIAGNOSIS — C541 Malignant neoplasm of endometrium: Secondary | ICD-10-CM

## 2020-12-23 NOTE — Progress Notes (Signed)
CT AP changed to without contrast given IV contrast shortage.

## 2020-12-23 NOTE — Patient Instructions (Addendum)
DUE TO COVID-19 ONLY ONE VISITOR IS ALLOWED TO COME WITH YOU AND STAY IN THE WAITING ROOM ONLY DURING PRE OP AND PROCEDURE DAY OF SURGERY. THE 1 VISITOR  MAY VISIT WITH YOU AFTER SURGERY IN YOUR PRIVATE ROOM DURING VISITING HOURS ONLY!                Stacie Russell   Your procedure is scheduled on: 12/29/20   Report to [facility] Main  Entrance   Report to admitting at: 11:30 AM     Call this number if you have problems the morning of surgery [phone]    Remember: Eat a light diet the day before surgery.  Examples including soups, broths, toast, yogurt, mashed potatoes.  Things to avoid include carbonated beverages (fizzy beverages), raw fruits and raw vegetables, or beans.   If your bowels are filled with gas, your surgeon will have difficulty visualizing your pelvic organs which increases your surgical risks. No solid food after midnight,clear liquids until: 10:30 am.  CLEAR LIQUID DIET  Foods Allowed                                                                     Foods Excluded  Coffee and tea, regular and decaf                             liquids that you cannot  Plain Jell-O any favor except red or purple                                           see through such as: Fruit ices (not with fruit pulp)                                     milk, soups, orange juice  Iced Popsicles                                    All solid food Carbonated beverages, regular and diet                                    Cranberry, grape and apple juices Sports drinks like Gatorade Lightly seasoned clear broth or consume(fat free) Sugar, honey syrup  Sample Menu Breakfast                                Lunch                                     Supper Cranberry juice                    Beef broth  Chicken broth Jell-O                                     Grape juice                           Apple juice Coffee or tea                        Jell-O                                       Popsicle                                                Coffee or tea                        Coffee or tea  _____________________________________________________________________  BRUSH YOUR TEETH MORNING OF SURGERY AND RINSE YOUR MOUTH OUT, NO CHEWING GUM CANDY OR MINTS.    Take these medicines the morning of surgery with A SIP OF WATER: Duloxetine,levothyroxine,pantoprazole,oxybutinin.                               You may not have any metal on your body including hair pins and              piercings  Do not wear jewelry, make-up, lotions, powders or perfumes, deodorant             Do not wear nail polish on your fingernails.  Do not shave  48 hours prior to surgery.    Do not bring valuables to the hospital. [place].  Contacts, dentures or bridgework may not be worn into surgery.  Leave suitcase in the car. After surgery it may be brought to your room.     Patients discharged the day of surgery will not be allowed to drive home. IF YOU ARE HAVING SURGERY AND GOING HOME THE SAME DAY, YOU MUST HAVE AN ADULT TO DRIVE YOU HOME AND BE WITH YOU FOR 24 HOURS. YOU MAY GO HOME BY TAXI OR UBER OR ORTHERWISE, BUT AN ADULT MUST ACCOMPANY YOU HOME AND STAY WITH YOU FOR 24 HOURS.  Name and phone number of your driver:  Special Instructions: N/A              Please read over the following fact sheets you were given: _____________________________________________________________________          [facility] - Preparing for Surgery Before surgery, you can play an important role.  Because skin is not sterile, your skin needs to be as free of germs as possible.  You can reduce the number of germs on your skin by washing with CHG (chlorahexidine gluconate) soap before surgery.  CHG is an antiseptic cleaner which kills germs and bonds with the skin to continue killing germs even after washing. Please DO NOT use if you have an  allergy to CHG or  antibacterial soaps.  If your skin becomes reddened/irritated stop using the CHG and inform your nurse when you arrive at Short Stay. Do not shave (including legs and underarms) for at least 48 hours prior to the first CHG shower.  You may shave your face/neck. Please follow these instructions carefully:  1.  Shower with CHG Soap the night before surgery and the  morning of Surgery.  2.  If you choose to wash your hair, wash your hair first as usual with your  normal  shampoo.  3.  After you shampoo, rinse your hair and body thoroughly to remove the  shampoo.                           4.  Use CHG as you would any other liquid soap.  You can apply chg directly  to the skin and wash                       Gently with a scrungie or clean washcloth.  5.  Apply the CHG Soap to your body ONLY FROM THE NECK DOWN.   Do not use on face/ open                           Wound or open sores. Avoid contact with eyes, ears mouth and genitals (private parts).                       Wash face,  Genitals (private parts) with your normal soap.             6.  Wash thoroughly, paying special attention to the area where your surgery  will be performed.  7.  Thoroughly rinse your body with warm water from the neck down.  8.  DO NOT shower/wash with your normal soap after using and rinsing off  the CHG Soap.                9.  Pat yourself dry with a clean towel.            10.  Wear clean pajamas.            11.  Place clean sheets on your bed the night of your first shower and do not  sleep with pets. Day of Surgery : Do not apply any lotions/deodorants the morning of surgery.  Please wear clean clothes to the hospital/surgery center.  FAILURE TO FOLLOW THESE INSTRUCTIONS MAY RESULT IN THE CANCELLATION OF YOUR SURGERY PATIENT SIGNATURE_________________________________  NURSE SIGNATURE__________________________________  ________________________________________________________________________

## 2020-12-26 ENCOUNTER — Other Ambulatory Visit: Payer: Self-pay

## 2020-12-26 ENCOUNTER — Encounter (HOSPITAL_COMMUNITY)
Admission: RE | Admit: 2020-12-26 | Discharge: 2020-12-26 | Disposition: A | Discharge status: Home / Self Care | Payer: Medicare Other | Source: Ambulatory Visit | Attending: Gynecologic Oncology | Admitting: Gynecologic Oncology

## 2020-12-26 ENCOUNTER — Telehealth: Payer: Self-pay

## 2020-12-26 ENCOUNTER — Ambulatory Visit (HOSPITAL_COMMUNITY)
Admission: RE | Admit: 2020-12-26 | Discharge: 2020-12-26 | Disposition: A | Discharge status: Home / Self Care | Payer: Medicare Other | Source: Ambulatory Visit | Attending: Gynecologic Oncology | Admitting: Gynecologic Oncology

## 2020-12-26 ENCOUNTER — Encounter (HOSPITAL_COMMUNITY): Payer: Self-pay

## 2020-12-26 DIAGNOSIS — I708 Atherosclerosis of other arteries: Secondary | ICD-10-CM | POA: Diagnosis not present

## 2020-12-26 DIAGNOSIS — C541 Malignant neoplasm of endometrium: Secondary | ICD-10-CM | POA: Insufficient documentation

## 2020-12-26 DIAGNOSIS — K3189 Other diseases of stomach and duodenum: Secondary | ICD-10-CM | POA: Diagnosis not present

## 2020-12-26 DIAGNOSIS — Z01812 Encounter for preprocedural laboratory examination: Secondary | ICD-10-CM | POA: Insufficient documentation

## 2020-12-26 DIAGNOSIS — K449 Diaphragmatic hernia without obstruction or gangrene: Secondary | ICD-10-CM | POA: Diagnosis not present

## 2020-12-26 HISTORY — DX: Other reaction to spinal and lumbar puncture: G97.1

## 2020-12-26 HISTORY — DX: Dyspnea, unspecified: R06.00

## 2020-12-26 LAB — URINALYSIS, ROUTINE W REFLEX MICROSCOPIC
Bacteria, UA: NONE SEEN
Bilirubin Urine: NEGATIVE
Glucose, UA: NEGATIVE mg/dL
Ketones, ur: NEGATIVE mg/dL
Nitrite: NEGATIVE
Protein, ur: NEGATIVE mg/dL
Specific Gravity, Urine: 1.016 (ref 1.005–1.030)
pH: 5 (ref 5.0–8.0)

## 2020-12-26 LAB — CBC
HCT: 42.5 % (ref 36.0–46.0)
Hemoglobin: 13.8 g/dL (ref 12.0–15.0)
MCH: 30.5 pg (ref 26.0–34.0)
MCHC: 32.5 g/dL (ref 30.0–36.0)
MCV: 94 fL (ref 80.0–100.0)
Platelets: 227 10*3/uL (ref 150–400)
RBC: 4.52 MIL/uL (ref 3.87–5.11)
RDW: 13.3 % (ref 11.5–15.5)
WBC: 7.9 10*3/uL (ref 4.0–10.5)
nRBC: 0 % (ref 0.0–0.2)

## 2020-12-26 NOTE — Progress Notes (Addendum)
Anesthesia Review:  PCP: DR Adam Phenix  LOV 09/06/20  Cardiologist : Chest x-ray : EKG :01/22/20  Echo :2018  Stress test:2018  Cardiac Cath :  Activity level: can do a flight of stairs  Sleep Study/ CPAP : none  Fasting Blood Sugar :      / Checks Blood Sugar -- times a day:   Blood Thinner/ Instructions /Last Dose: ASA / Instructions/ Last Dose :  CMP done 12/19/20- in epic  U/A done 12/26/20 routed to MelissaCross,NP and DR Everitt Amber.   PT is Ambulatory surgery no covid s/s at preop.  No order in epic for covid testing preop by MD.  No covid test ordered.

## 2020-12-26 NOTE — Progress Notes (Signed)
DUE TO COVID-19 ONLY ONE VISITOR IS ALLOWED TO COME WITH YOU AND STAY IN THE WAITING ROOM ONLY DURING PRE OP AND PROCEDURE DAY OF SURGERY. THE 1 VISITOR  MAY VISIT WITH YOU AFTER SURGERY IN YOUR PRIVATE ROOM DURING VISITING HOURS ONLY!  YOU NEED TO HAVE A COVID 19 TEST ON_______ @_______ , THIS TEST MUST BE DONE BEFORE SURGERY,  COVID TESTING SITE [address] [place] [id number], IT IS ON THE RIGHT GOING OUT [address] APPROXIMATELY  2 MINUTES PAST [employer] ON THE RIGHT. ONCE YOUR COVID TEST IS COMPLETED,  PLEASE BEGIN THE QUARANTINE INSTRUCTIONS AS OUTLINED IN YOUR HANDOUT.                AKARI DEFELICE  12/26/2020   Your procedure is scheduled on:               12/29/2020   Report to [facility] Main  Entrance   Report to admitting a t 1130  Am     Call this number if you have problems the morning of surgery [phone]    Remember: Do not eat food , candy gum or mints :After Midnight. You may have clear liquids from midnight until     CLEAR LIQUID DIET   Foods Allowed                                                                       Coffee and tea, regular and decaf                              Plain Jell-O any favor except red or purple                                            Fruit ices (not with fruit pulp)                                      Iced Popsicles                                     Carbonated beverages, regular and diet                                    Cranberry, grape and apple juices Sports drinks like Gatorade Lightly seasoned clear broth or consume(fat free) Sugar, honey syrup   _____________________________________________________________________    BRUSH YOUR TEETH MORNING OF SURGERY AND RINSE YOUR MOUTH OUT, NO CHEWING GUM CANDY OR MINTS.     Take these medicines the morning of surgery with A SIP OF WATER:  Synthroid, inhalers as usual and bring  DO NOT TAKE ANY DIABETIC MEDICATIONS DAY OF YOUR  SURGERY  You may not have any metal on your body including hair pins and              piercings  Do not wear jewelry, make-up, lotions, powders or perfumes, deodorant             Do not wear nail polish on your fingernails.  Do not shave  48 hours prior to surgery.              Men may shave face and neck.   Do not bring valuables to the hospital. [place].  Contacts, dentures or bridgework may not be worn into surgery.  Leave suitcase in the car. After surgery it may be brought to your room.     Patients discharged the day of surgery will not be allowed to drive home. IF YOU ARE HAVING SURGERY AND GOING HOME THE SAME DAY, YOU MUST HAVE AN ADULT TO DRIVE YOU HOME AND BE WITH YOU FOR 24 HOURS. YOU MAY GO HOME BY TAXI OR UBER OR ORTHERWISE, BUT AN ADULT MUST ACCOMPANY YOU HOME AND STAY WITH YOU FOR 24 HOURS.  Name and phone number of your driver:  Special Instructions: N/A              Please read over the following fact sheets you were given: _____________________________________________________________________  [facility] - Preparing for Surgery Before surgery, you can play an important role.  Because skin is not sterile, your skin needs to be as free of germs as possible.  You can reduce the number of germs on your skin by washing with CHG (chlorahexidine gluconate) soap before surgery.  CHG is an antiseptic cleaner which kills germs and bonds with the skin to continue killing germs even after washing. Please DO NOT use if you have an allergy to CHG or antibacterial soaps.  If your skin becomes reddened/irritated stop using the CHG and inform your nurse when you arrive at Short Stay. Do not shave (including legs and underarms) for at least 48 hours prior to the first CHG shower.  You may shave your face/neck. Please follow these instructions carefully:  1.  Shower with CHG Soap the night before surgery and the   morning of Surgery.  2.  If you choose to wash your hair, wash your hair first as usual with your  normal  shampoo.  3.  After you shampoo, rinse your hair and body thoroughly to remove the  shampoo.                           4.  Use CHG as you would any other liquid soap.  You can apply chg directly  to the skin and wash                       Gently with a scrungie or clean washcloth.  5.  Apply the CHG Soap to your body ONLY FROM THE NECK DOWN.   Do not use on face/ open                           Wound or open sores. Avoid contact with eyes, ears mouth and genitals (private parts).  Wash face,  Genitals (private parts) with your normal soap.             6.  Wash thoroughly, paying special attention to the area where your surgery  will be performed.  7.  Thoroughly rinse your body with warm water from the neck down.  8.  DO NOT shower/wash with your normal soap after using and rinsing off  the CHG Soap.                9.  Pat yourself dry with a clean towel.            10.  Wear clean pajamas.            11.  Place clean sheets on your bed the night of your first shower and do not  sleep with pets. Day of Surgery : Do not apply any lotions/deodorants the morning of surgery.  Please wear clean clothes to the hospital/surgery center.  FAILURE TO FOLLOW THESE INSTRUCTIONS MAY RESULT IN THE CANCELLATION OF YOUR SURGERY PATIENT SIGNATURE_________________________________  NURSE SIGNATURE__________________________________  ________________________________________________________________________

## 2020-12-26 NOTE — Telephone Encounter (Signed)
Told Stacie Russell that the scan showed no evidence of metastatic disease. She has a fatty liver from obesity.  Aortic atherosclerosis and coronary artery calcification. Small hiatal hernia. Pt verbalized understanding.

## 2020-12-27 ENCOUNTER — Inpatient Hospital Stay (HOSPITAL_COMMUNITY)
Admission: RE | Admit: 2020-12-27 | Discharge: 2020-12-27 | Disposition: A | Discharge status: Home / Self Care | Payer: Medicare Other | Source: Ambulatory Visit

## 2020-12-28 ENCOUNTER — Other Ambulatory Visit: Payer: Self-pay | Admitting: Gynecologic Oncology

## 2020-12-28 ENCOUNTER — Telehealth: Payer: Self-pay | Admitting: Oncology

## 2020-12-28 DIAGNOSIS — R319 Hematuria, unspecified: Secondary | ICD-10-CM

## 2020-12-28 DIAGNOSIS — N390 Urinary tract infection, site not specified: Secondary | ICD-10-CM

## 2020-12-28 MED ORDER — NITROFURANTOIN MONOHYD MACRO 100 MG PO CAPS: 100.0000 mg | ORAL_CAPSULE | Freq: Two times a day (BID) | ORAL | 0 refills | Status: DC

## 2020-12-28 NOTE — Anesthesia Preprocedure Evaluation (Addendum)
Anesthesia Evaluation  Patient identified by MRN, date of birth, ID band Patient awake    Reviewed: Allergy & Precautions, NPO status , Patient's Chart, lab work & pertinent test results  History of Anesthesia Complications (+) PONV  Airway Mallampati: II  TM Distance: >3 FB Neck ROM: Full    Dental no notable dental hx. (+) Teeth Intact, Dental Advisory Given   Pulmonary asthma , sleep apnea ,    Pulmonary exam normal breath sounds clear to auscultation       Cardiovascular hypertension, Pt. on medications Normal cardiovascular exam Rhythm:Regular Rate:Normal  01/30/2017 Echo Left ventricle: The cavity size was normal. Wall thickness was  increased in a pattern of moderate LVH. Systolic function was  normal. The estimated ejection fraction was in the range of 55%  to 60%. Wall motion was normal; there were no regional wall  motion abnormalities. Doppler parameters are consistent with  abnormal left ventricular relaxation (grade 1 diastolic  dysfunction). The E/e&' ratio is between 8-15, suggesting  indeterminate LV filling pressure.  - Aortic valve: Trileaflet. Sclerosis without stenosis. There was  no regurgitation.  - Left atrium: The atrium was normal in size.  - Inferior vena cava: The vessel was normal in size. The  respirophasic diameter changes were in the normal range (>= 50%),  consistent with normal central venous pressure.   Impressions:   - LVEF 55-60%, moderate LVH, normal wall motion, grade 1 DD,  indeterminate LV filling pressure, aortic valve sclerosis, normal  LA size, normal IVC.   -------------------------------------------------------------------  Study data:  Study status: Routine. Procedure: The patient  reported no pain pre or post test. Transthoracic echocardiography.  Image quality was adequate. The study was technically difficult, as  a result of body habitus.  Intravenous contrast (Definity) was  administered. Study completion: There were no complications.   Transthoracic echocardiography. M-mode, complete 2D, spectral  Doppler, and color Doppler. Birthdate: Patient birthdate:  [birth year]. Age: Patient is 70 yr old. Sex: Gender: female.  BMI: 44.5 kg/m^2. Blood pressure:   154/100 Patient status:  Outpatient. Study date: Study date: 01/30/2017. Study time: 09:05  AM. Location: [facility]   -------------------------------------------------------------------   -------------------------------------------------------------------  Left ventricle: The cavity size was normal. Wall thickness was  increased in a pattern of moderate LVH. Systolic function was  normal. The estimated ejection fraction was in the range of 55% to  60%. Wall motion was normal; there were no regional wall motion  abnormalities. Doppler parameters are consistent with abnormal left  ventricular relaxation (grade 1 diastolic dysfunction). The E/e&'  ratio is between 8-15, suggesting indeterminate LV filling  pressure.    Neuro/Psych negative psych ROS   GI/Hepatic Neg liver ROS, hiatal hernia, GERD  ,  Endo/Other  Hypothyroidism Morbid obesity  Renal/GU negative Renal ROSLab Results      Component                Value               Date                      WBC                      7.9                 12/26/2020                HGB  13.8                12/26/2020                HCT                      42.5                12/26/2020                MCV                      94.0                12/26/2020                PLT                      227                 12/26/2020                Musculoskeletal  (+) Arthritis ,   Abdominal (+) + obese,   Peds  Hematology Lab Results      Component                Value               Date                      WBC                      7.9                 12/26/2020                 HGB                      13.8                12/26/2020                HCT                      42.5                12/26/2020                MCV                      94.0                12/26/2020                PLT                      227                 12/26/2020              Anesthesia Other Findings   Reproductive/Obstetrics                            Anesthesia Physical Anesthesia Plan  ASA: III  Anesthesia Plan: General   Post-op Pain Management:    Induction:   PONV Risk Score  and Plan: 4 or greater and Treatment may vary due to age or medical condition, Ondansetron, Dexamethasone and Midazolam  Airway Management Planned: Oral ETT  Additional Equipment: None  Intra-op Plan:   Post-operative Plan: Extubation in OR  Informed Consent: I have reviewed the patients History and Physical, chart, labs and discussed the procedure including the risks, benefits and alternatives for the proposed anesthesia with the patient or authorized representative who has indicated his/her understanding and acceptance.     Dental advisory given  Plan Discussed with: CRNA  Anesthesia Plan Comments: (GA w Lidoinfusion)       Anesthesia Quick Evaluation

## 2020-12-28 NOTE — Telephone Encounter (Signed)
Called Stacie Russell and advised her that the urine culture came back showing a UTI. She said she thought she had one because she is having some burning with urination and frequency.    Advised we will send in [place] which she will need to take twice a day and it would be good if she could get two doses in today.  Also advised she does not need to take it tomorrow morning since she will be getting an antibiotic preop.  Also discussed that the sensitivity is not back yet so the antibiotic may be changed depending on the results. She would like to have it sent to the [facility] in [place].

## 2020-12-28 NOTE — Progress Notes (Signed)
See RN note.

## 2020-12-29 ENCOUNTER — Ambulatory Visit (HOSPITAL_COMMUNITY): Payer: Medicare Other | Admitting: Anesthesiology

## 2020-12-29 ENCOUNTER — Encounter (HOSPITAL_COMMUNITY): Payer: Self-pay | Admitting: Gynecologic Oncology

## 2020-12-29 ENCOUNTER — Encounter (HOSPITAL_COMMUNITY)
Admission: RE | Disposition: A | Discharge status: Home / Self Care | Payer: Self-pay | Source: Home / Self Care | Attending: Gynecologic Oncology

## 2020-12-29 ENCOUNTER — Other Ambulatory Visit: Payer: Self-pay

## 2020-12-29 ENCOUNTER — Ambulatory Visit (HOSPITAL_COMMUNITY): Payer: Medicare Other | Admitting: Physician Assistant

## 2020-12-29 ENCOUNTER — Ambulatory Visit (HOSPITAL_COMMUNITY)
Admission: RE | Admit: 2020-12-29 | Discharge: 2020-12-29 | Disposition: A | Discharge status: Home / Self Care | Payer: Medicare Other | Attending: Gynecologic Oncology | Admitting: Gynecologic Oncology

## 2020-12-29 DIAGNOSIS — I10 Essential (primary) hypertension: Secondary | ICD-10-CM | POA: Insufficient documentation

## 2020-12-29 DIAGNOSIS — E7800 Pure hypercholesterolemia, unspecified: Secondary | ICD-10-CM | POA: Insufficient documentation

## 2020-12-29 DIAGNOSIS — Z79890 Hormone replacement therapy (postmenopausal): Secondary | ICD-10-CM | POA: Insufficient documentation

## 2020-12-29 DIAGNOSIS — Z96651 Presence of right artificial knee joint: Secondary | ICD-10-CM | POA: Insufficient documentation

## 2020-12-29 DIAGNOSIS — B9620 Unspecified Escherichia coli [E. coli] as the cause of diseases classified elsewhere: Secondary | ICD-10-CM | POA: Diagnosis present

## 2020-12-29 DIAGNOSIS — C541 Malignant neoplasm of endometrium: Secondary | ICD-10-CM | POA: Insufficient documentation

## 2020-12-29 DIAGNOSIS — C775 Secondary and unspecified malignant neoplasm of intrapelvic lymph nodes: Secondary | ICD-10-CM | POA: Insufficient documentation

## 2020-12-29 DIAGNOSIS — E559 Vitamin D deficiency, unspecified: Secondary | ICD-10-CM | POA: Diagnosis not present

## 2020-12-29 DIAGNOSIS — N390 Urinary tract infection, site not specified: Secondary | ICD-10-CM | POA: Diagnosis present

## 2020-12-29 DIAGNOSIS — Z6841 Body Mass Index (BMI) 40.0 and over, adult: Secondary | ICD-10-CM | POA: Insufficient documentation

## 2020-12-29 DIAGNOSIS — Z7951 Long term (current) use of inhaled steroids: Secondary | ICD-10-CM | POA: Diagnosis not present

## 2020-12-29 DIAGNOSIS — E782 Mixed hyperlipidemia: Secondary | ICD-10-CM | POA: Diagnosis not present

## 2020-12-29 DIAGNOSIS — Z79899 Other long term (current) drug therapy: Secondary | ICD-10-CM | POA: Insufficient documentation

## 2020-12-29 HISTORY — PX: ROBOTIC ASSISTED TOTAL HYSTERECTOMY WITH BILATERAL SALPINGO OOPHERECTOMY: SHX6086

## 2020-12-29 HISTORY — PX: SENTINEL NODE BIOPSY: SHX6608

## 2020-12-29 LAB — TYPE AND SCREEN
ABO/RH(D): A POS
Antibody Screen: NEGATIVE

## 2020-12-29 LAB — URINE CULTURE: Culture: >=100000 — AB

## 2020-12-29 SURGERY — HYSTERECTOMY, TOTAL, ROBOT-ASSISTED, LAPAROSCOPIC, WITH BILATERAL SALPINGO-OOPHORECTOMY
Anesthesia: General | Site: Abdomen

## 2020-12-29 MED ORDER — ACETAMINOPHEN 10 MG/ML IV SOLN: 1000.0000 mg | Freq: Once | INTRAVENOUS | Status: DC | PRN

## 2020-12-29 MED ORDER — ENOXAPARIN SODIUM 40 MG/0.4ML IJ SOSY
40.0000 mg | PREFILLED_SYRINGE | INTRAMUSCULAR | Status: AC
  Administered 2020-12-29: 40 mg via SUBCUTANEOUS
  Filled 2020-12-29: qty 0.4

## 2020-12-29 MED ORDER — ACETAMINOPHEN 500 MG PO TABS: 1000.0000 mg | ORAL_TABLET | Freq: Once | ORAL | Status: AC

## 2020-12-29 MED ORDER — ROCURONIUM BROMIDE 10 MG/ML (PF) SYRINGE
PREFILLED_SYRINGE | INTRAVENOUS | Status: DC | PRN
  Administered 2020-12-29: 20 mg via INTRAVENOUS
  Administered 2020-12-29: 70 mg via INTRAVENOUS
  Administered 2020-12-29 (×2): 10 mg via INTRAVENOUS

## 2020-12-29 MED ORDER — SODIUM CHLORIDE 0.9% FLUSH
3.0000 mL | Freq: Two times a day (BID) | INTRAVENOUS | Status: DC
Start: 2020-12-29 — End: 2020-12-30

## 2020-12-29 MED ORDER — ACETAMINOPHEN 500 MG PO TABS
ORAL_TABLET | ORAL | Status: AC
  Administered 2020-12-29: 1000 mg via ORAL
  Filled 2020-12-29: qty 2

## 2020-12-29 MED ORDER — OXYCODONE HCL 5 MG PO TABS
ORAL_TABLET | ORAL | Status: AC
  Filled 2020-12-29: qty 1

## 2020-12-29 MED ORDER — HYDRALAZINE HCL 20 MG/ML IJ SOLN
INTRAMUSCULAR | Status: DC | PRN
  Administered 2020-12-29: 5 mg via INTRAVENOUS

## 2020-12-29 MED ORDER — HYDRALAZINE HCL 20 MG/ML IJ SOLN
INTRAMUSCULAR | Status: AC
  Administered 2020-12-29: 10 mg via INTRAVENOUS
  Filled 2020-12-29: qty 1

## 2020-12-29 MED ORDER — BUPIVACAINE HCL 0.25 % IJ SOLN
INTRAMUSCULAR | Status: AC
  Filled 2020-12-29: qty 1

## 2020-12-29 MED ORDER — DEXAMETHASONE SODIUM PHOSPHATE 4 MG/ML IJ SOLN: 4.0000 mg | INTRAMUSCULAR | Status: DC

## 2020-12-29 MED ORDER — LACTATED RINGERS IV SOLN: INTRAVENOUS | Status: DC

## 2020-12-29 MED ORDER — OXYCODONE HCL 5 MG PO TABS: 5.0000 mg | ORAL_TABLET | Freq: Once | ORAL | Status: DC | PRN

## 2020-12-29 MED ORDER — FENTANYL CITRATE (PF) 250 MCG/5ML IJ SOLN
INTRAMUSCULAR | Status: AC
  Filled 2020-12-29: qty 5

## 2020-12-29 MED ORDER — KETAMINE HCL 10 MG/ML IJ SOLN
INTRAMUSCULAR | Status: DC | PRN
  Administered 2020-12-29: 20 mg via INTRAVENOUS

## 2020-12-29 MED ORDER — ACETAMINOPHEN 500 MG PO TABS
1000.0000 mg | ORAL_TABLET | ORAL | Status: AC
  Administered 2020-12-29: 1000 mg via ORAL
  Filled 2020-12-29: qty 2

## 2020-12-29 MED ORDER — STERILE WATER FOR IRRIGATION IR SOLN
Status: DC | PRN
  Administered 2020-12-29: 1000 mL

## 2020-12-29 MED ORDER — SUGAMMADEX SODIUM 200 MG/2ML IV SOLN
INTRAVENOUS | Status: DC | PRN
  Administered 2020-12-29: 200 mg via INTRAVENOUS

## 2020-12-29 MED ORDER — LIDOCAINE 2% (20 MG/ML) 5 ML SYRINGE
INTRAMUSCULAR | Status: DC | PRN
  Administered 2020-12-29: 1.5 mg/kg/h via INTRAVENOUS

## 2020-12-29 MED ORDER — GABAPENTIN 100 MG PO CAPS
100.0000 mg | ORAL_CAPSULE | ORAL | Status: AC
Start: 2020-12-29 — End: 2020-12-29
  Administered 2020-12-29: 100 mg via ORAL
  Filled 2020-12-29: qty 1

## 2020-12-29 MED ORDER — CEFAZOLIN SODIUM-DEXTROSE 2-4 GM/100ML-% IV SOLN
2.0000 g | INTRAVENOUS | Status: AC
  Administered 2020-12-29: 2 g via INTRAVENOUS
  Filled 2020-12-29: qty 100

## 2020-12-29 MED ORDER — HYDROMORPHONE HCL 1 MG/ML IJ SOLN
0.5000 mg | INTRAMUSCULAR | Status: AC | PRN
  Administered 2020-12-29: 0.5 mg via INTRAVENOUS

## 2020-12-29 MED ORDER — DEXAMETHASONE SODIUM PHOSPHATE 10 MG/ML IJ SOLN
INTRAMUSCULAR | Status: DC | PRN
  Administered 2020-12-29: 5 mg via INTRAVENOUS

## 2020-12-29 MED ORDER — LABETALOL HCL 5 MG/ML IV SOLN
INTRAVENOUS | Status: AC
  Filled 2020-12-29: qty 4

## 2020-12-29 MED ORDER — STERILE WATER FOR INJECTION IJ SOLN
INTRAMUSCULAR | Status: AC
  Filled 2020-12-29: qty 10

## 2020-12-29 MED ORDER — HYDROMORPHONE HCL 1 MG/ML IJ SOLN
INTRAMUSCULAR | Status: AC
  Administered 2020-12-29: 0.5 mg via INTRAVENOUS
  Filled 2020-12-29: qty 1

## 2020-12-29 MED ORDER — ONDANSETRON HCL 4 MG/2ML IJ SOLN
INTRAMUSCULAR | Status: DC | PRN
  Administered 2020-12-29: 4 mg via INTRAVENOUS

## 2020-12-29 MED ORDER — STERILE WATER FOR INJECTION IJ SOLN
INTRAMUSCULAR | Status: DC | PRN
  Administered 2020-12-29: 1 mL via INTRAMUSCULAR

## 2020-12-29 MED ORDER — LIDOCAINE 2% (20 MG/ML) 5 ML SYRINGE
INTRAMUSCULAR | Status: DC | PRN
  Administered 2020-12-29: 100 mg via INTRAVENOUS

## 2020-12-29 MED ORDER — BUPIVACAINE HCL 0.25 % IJ SOLN
INTRAMUSCULAR | Status: DC | PRN
  Administered 2020-12-29: 18 mL

## 2020-12-29 MED ORDER — AMISULPRIDE (ANTIEMETIC) 5 MG/2ML IV SOLN
10.0000 mg | Freq: Once | INTRAVENOUS | Status: AC | PRN
  Administered 2020-12-29: 10 mg via INTRAVENOUS

## 2020-12-29 MED ORDER — PROPOFOL 10 MG/ML IV BOLUS
INTRAVENOUS | Status: DC | PRN
  Administered 2020-12-29: 140 mg via INTRAVENOUS

## 2020-12-29 MED ORDER — FENTANYL CITRATE (PF) 100 MCG/2ML IJ SOLN
INTRAMUSCULAR | Status: DC | PRN
  Administered 2020-12-29 (×5): 50 ug via INTRAVENOUS

## 2020-12-29 MED ORDER — PROPOFOL 10 MG/ML IV BOLUS
INTRAVENOUS | Status: AC
  Filled 2020-12-29: qty 20

## 2020-12-29 MED ORDER — OXYCODONE HCL 5 MG/5ML PO SOLN: 5.0000 mg | Freq: Once | ORAL | Status: DC | PRN

## 2020-12-29 MED ORDER — ONDANSETRON HCL 4 MG/2ML IJ SOLN: 4.0000 mg | Freq: Once | INTRAMUSCULAR | Status: DC | PRN

## 2020-12-29 MED ORDER — HYDRALAZINE HCL 20 MG/ML IJ SOLN: 10.0000 mg | Freq: Once | INTRAMUSCULAR | Status: AC

## 2020-12-29 MED ORDER — LACTATED RINGERS IR SOLN
Status: DC | PRN
  Administered 2020-12-29: 1000 mL

## 2020-12-29 MED ORDER — HYDROMORPHONE HCL 1 MG/ML IJ SOLN
0.2500 mg | INTRAMUSCULAR | Status: DC | PRN
Start: 2020-12-29 — End: 2020-12-30
  Administered 2020-12-29: 0.25 mg via INTRAVENOUS
  Administered 2020-12-29: 0.5 mg via INTRAVENOUS
  Administered 2020-12-29: 0.25 mg via INTRAVENOUS

## 2020-12-29 MED ORDER — AMISULPRIDE (ANTIEMETIC) 5 MG/2ML IV SOLN
INTRAVENOUS | Status: AC
  Filled 2020-12-29: qty 4

## 2020-12-29 MED ORDER — LABETALOL HCL 5 MG/ML IV SOLN
INTRAVENOUS | Status: DC | PRN
  Administered 2020-12-29: 5 mg via INTRAVENOUS

## 2020-12-29 SURGICAL SUPPLY — 78 items
ADH SKN CLS APL DERMABOND .7 (GAUZE/BANDAGES/DRESSINGS) ×2
AGENT HMST KT MTR STRL THRMB (HEMOSTASIS)
APL ESCP 34 STRL LF DISP (HEMOSTASIS)
APPLICATOR SURGIFLO ENDO (HEMOSTASIS) IMPLANT
BACTOSHIELD CHG 4% 4OZ (MISCELLANEOUS) ×1
BAG LAPAROSCOPIC 12 15 PORT 16 (BASKET) IMPLANT
BAG RETRIEVAL 12/15 (BASKET)
BAG SPEC RTRVL LRG 6X4 10 (ENDOMECHANICALS)
BLADE SURG SZ10 CARB STEEL (BLADE) IMPLANT
CELLS DAT CNTRL 66122 CELL SVR (MISCELLANEOUS) IMPLANT
COVER BACK TABLE 60X90IN (DRAPES) ×3 IMPLANT
COVER TIP SHEARS 8 DVNC (MISCELLANEOUS) ×2 IMPLANT
COVER TIP SHEARS 8MM DA VINCI (MISCELLANEOUS) ×3
COVER WAND RF STERILE (DRAPES) IMPLANT
DECANTER SPIKE VIAL GLASS SM (MISCELLANEOUS) ×1 IMPLANT
DERMABOND ADVANCED (GAUZE/BANDAGES/DRESSINGS) ×1
DERMABOND ADVANCED .7 DNX12 (GAUZE/BANDAGES/DRESSINGS) ×2 IMPLANT
DRAPE ARM DVNC X/XI (DISPOSABLE) ×8 IMPLANT
DRAPE COLUMN DVNC XI (DISPOSABLE) ×2 IMPLANT
DRAPE DA VINCI XI ARM (DISPOSABLE) ×12
DRAPE DA VINCI XI COLUMN (DISPOSABLE) ×3
DRAPE SHEET LG 3/4 BI-LAMINATE (DRAPES) ×3 IMPLANT
DRAPE SURG IRRIG POUCH 19X23 (DRAPES) ×3 IMPLANT
DRSG OPSITE POSTOP 4X6 (GAUZE/BANDAGES/DRESSINGS) IMPLANT
DRSG OPSITE POSTOP 4X8 (GAUZE/BANDAGES/DRESSINGS) IMPLANT
ELECT PENCIL ROCKER SW 15FT (MISCELLANEOUS) IMPLANT
ELECT REM PT RETURN 15FT ADLT (MISCELLANEOUS) ×3 IMPLANT
GLOVE SURG ENC MOIS LTX SZ6 (GLOVE) ×12 IMPLANT
GLOVE SURG ENC MOIS LTX SZ6.5 (GLOVE) ×6 IMPLANT
GOWN STRL REUS W/ TWL LRG LVL3 (GOWN DISPOSABLE) ×8 IMPLANT
GOWN STRL REUS W/TWL LRG LVL3 (GOWN DISPOSABLE) ×12
HOLDER FOLEY CATH W/STRAP (MISCELLANEOUS) ×1 IMPLANT
IRRIG SUCT STRYKERFLOW 2 WTIP (MISCELLANEOUS) ×3
IRRIGATION SUCT STRKRFLW 2 WTP (MISCELLANEOUS) ×2 IMPLANT
KIT PROCEDURE DA VINCI SI (MISCELLANEOUS) ×3
KIT PROCEDURE DVNC SI (MISCELLANEOUS) IMPLANT
KIT TURNOVER KIT A (KITS) ×3 IMPLANT
MANIPULATOR UTERINE 4.5 ZUMI (MISCELLANEOUS) ×3 IMPLANT
NDL SPNL 18GX3.5 QUINCKE PK (NEEDLE) IMPLANT
NEEDLE HYPO 22GX1.5 SAFETY (NEEDLE) ×3 IMPLANT
NEEDLE SPNL 18GX3.5 QUINCKE PK (NEEDLE) ×3 IMPLANT
OBTURATOR OPTICAL STANDARD 8MM (TROCAR) ×3
OBTURATOR OPTICAL STND 8 DVNC (TROCAR) ×2
OBTURATOR OPTICALSTD 8 DVNC (TROCAR) ×2 IMPLANT
PACK ROBOT GYN CUSTOM WL (TRAY / TRAY PROCEDURE) ×3 IMPLANT
PAD POSITIONING PINK XL (MISCELLANEOUS) ×3 IMPLANT
PORT ACCESS TROCAR AIRSEAL 12 (TROCAR) ×2 IMPLANT
PORT ACCESS TROCAR AIRSEAL 5M (TROCAR) ×1
POUCH SPECIMEN RETRIEVAL 10MM (ENDOMECHANICALS) IMPLANT
RETRACTOR WND ALEXIS 18 MED (MISCELLANEOUS) IMPLANT
RETRACTOR WND ALEXIS 25 LRG (MISCELLANEOUS) IMPLANT
RTRCTR WOUND ALEXIS 18CM MED (MISCELLANEOUS)
RTRCTR WOUND ALEXIS 25CM LRG (MISCELLANEOUS)
SCRUB CHG 4% DYNA-HEX 4OZ (MISCELLANEOUS) ×2 IMPLANT
SEAL CANN UNIV 5-8 DVNC XI (MISCELLANEOUS) ×6 IMPLANT
SEAL XI 5MM-8MM UNIVERSAL (MISCELLANEOUS) ×12
SET TRI-LUMEN FLTR TB AIRSEAL (TUBING) ×3 IMPLANT
SPONGE LAP 18X18 RF (DISPOSABLE) IMPLANT
SURGIFLO W/THROMBIN 8M KIT (HEMOSTASIS) IMPLANT
SUT MNCRL AB 4-0 PS2 18 (SUTURE) IMPLANT
SUT PDS AB 1 TP1 96 (SUTURE) IMPLANT
SUT VIC AB 0 CT1 27 (SUTURE) ×3
SUT VIC AB 0 CT1 27XBRD ANTBC (SUTURE) ×2 IMPLANT
SUT VIC AB 2-0 CT1 27 (SUTURE)
SUT VIC AB 2-0 CT1 TAPERPNT 27 (SUTURE) IMPLANT
SUT VIC AB 4-0 PS2 18 (SUTURE) ×6 IMPLANT
SUT VLOC 180 0 9IN  GS21 (SUTURE) ×3
SUT VLOC 180 0 9IN GS21 (SUTURE) ×2 IMPLANT
SYR 10ML LL (SYRINGE) ×1 IMPLANT
SYR 20ML LL LF (SYRINGE) IMPLANT
SYR 50ML LL SCALE MARK (SYRINGE) IMPLANT
TOWEL OR NON WOVEN STRL DISP B (DISPOSABLE) ×3 IMPLANT
TRAP SPECIMEN MUCUS 40CC (MISCELLANEOUS) IMPLANT
TRAY FOLEY MTR SLVR 16FR STAT (SET/KITS/TRAYS/PACK) ×3 IMPLANT
TROCAR XCEL NON-BLD 5MMX100MML (ENDOMECHANICALS) IMPLANT
UNDERPAD 30X36 HEAVY ABSORB (UNDERPADS AND DIAPERS) ×3 IMPLANT
WATER STERILE IRR 1000ML POUR (IV SOLUTION) ×3 IMPLANT
YANKAUER SUCT BULB TIP 10FT TU (MISCELLANEOUS) IMPLANT

## 2020-12-29 NOTE — Discharge Instructions (Signed)
12/29/2020  Return to work: 4 weeks  Activity: 1. Be up and out of the bed during the day.  Take a nap if needed.  You may walk up steps but be careful and use the hand rail.  Stair climbing will tire you more than you think, you may need to stop part way and rest.   2. No lifting or straining for 6 weeks.  3. No driving for 1 week.  Do Not drive if you are taking narcotic pain medicine.  4. Shower daily.  Use soap and water on your incision and pat dry; don't rub.   5. No sexual activity and nothing in the vagina for 8 weeks.  Medications:  - Take ibuprofen and tylenol first line for pain control. Take these regularly (every 6 hours) to decrease the build up of pain.  - If necessary, for severe pain not relieved by ibuprofen, take oxycodone.  - While taking oxycodone you should take sennakot every night to reduce the likelihood of constipation. If this causes diarrhea, stop its use.  Diet: 1. Low sodium Heart Healthy Diet is recommended.  2. It is safe to use a laxative if you have difficulty moving your bowels.   Wound Care: 1. Keep clean and dry.  Shower daily.  Reasons to call the Doctor:   Fever - Oral temperature greater than 100.4 degrees Fahrenheit  Foul-smelling vaginal discharge  Difficulty urinating  Nausea and vomiting  Increased pain at the site of the incision that is unrelieved with pain medicine.  Difficulty breathing with or without chest pain  New calf pain especially if only on one side  Sudden, continuing increased vaginal bleeding with or without clots.   Follow-up: 1. See Everitt Amber in 3 weeks. You will have a phone visit with Dr Denman George in 1 week (This is not at the cancer center but is by phone so that she can explain your pathology results after they have come back).  Contacts: For questions or concerns you should contact:  Dr. Everitt Amber at [phone] After hours and on week-ends call [phone] and ask to speak to the physician on  call for Gynecologic Oncology

## 2020-12-29 NOTE — Op Note (Signed)
OPERATIVE NOTE 12/29/20  Surgeon: Donaciano Eva   Assistants: Dr Lahoma Crocker (an MD assistant was necessary for tissue manipulation, management of robotic instrumentation, retraction and positioning due to the complexity of the case and hospital policies).   Anesthesia: General endotracheal anesthesia  ASA Class: 3   Pre-operative Diagnosis: endometrial cancer grade 3  Post-operative Diagnosis: same,   Operation: Robotic-assisted laparoscopic total hysterectomy with bilateral salpingoophorectomy, SLN biopsy   Surgeon: Donaciano Eva  Assistant Surgeon: Lahoma Crocker MD  Anesthesia: GET  Urine Output: 20cc  Operative Findings:  : morbid obesity (BMI 44kg/m2), normal appearing uterus with tubes and ovaries, no suspicious nodes, bilateral SLN mapping.   Estimated Blood Loss:  30cc      Total IV Fluids: 800 ml         Specimens: uterus, cervix, bilateral tubes and ovaries, right and left obturator SLN         Complications:  None; patient tolerated the procedure well.         Disposition: PACU - hemodynamically stable.  Procedure Details  The patient was seen in the Holding Room. The risks, benefits, complications, treatment options, and expected outcomes were discussed with the patient.  The patient concurred with the proposed plan, giving informed consent.  The site of surgery properly noted/marked. The patient was identified as Neila Gear and the procedure verified as a Robotic-assisted hysterectomy with bilateral salpingo oophorectomy with SLN biopsy. A Time Out was held and the above information confirmed.  After induction of anesthesia, the patient was draped and prepped in the usual sterile manner. Pt was placed in supine position after anesthesia and draped and prepped in the usual sterile manner. The abdominal drape was placed after the CholoraPrep had been allowed to dry for 3 minutes.  Her arms were tucked to her side with all appropriate  precautions.  The shoulders were stabilized with padded shoulder blocks applied to the acromium processes.  The patient was placed in the semi-lithotomy position in [place].  The perineum was prepped with Betadine. The patient was then prepped. Foley catheter was placed.  A sterile speculum was placed in the vagina.  The cervix was grasped with a single-tooth tenaculum. 2mg  total of ICG was injected into the cervical stroma at 2 and 9 o'clock with 1cc injected at a 1cm and 33mm depth (concentration 0.5mg /ml) in all locations. The cervix was dilated with Kennon Rounds dilators.  The ZUMI uterine manipulator with a medium colpotomizer ring was placed without difficulty.  A pneum occluder balloon was placed over the manipulator.  OG tube placement was confirmed and to suction.   Next, a 5 mm skin incision was made 1 cm below the subcostal margin in the midclavicular line.  The 5 mm Optiview port and scope was used for direct entry.  Opening pressure was under 10 mm CO2.  The abdomen was insufflated and the findings were noted as above.   At this point and all points during the procedure, the patient's intra-abdominal pressure did not exceed 15 mmHg. Next, a 10 mm skin incision was made in the umbilicus and a right and left port was placed about 10 cm lateral to the robot port on the right and left side.  A fourth arm was placed in the left lower quadrant 2 cm above and superior and medial to the anterior superior iliac spine.  All ports were placed under direct visualization.  The patient was placed in steep Trendelenburg.  Bowel was folded away into  the upper abdomen.  The robot was docked in the normal manner.  The right and left peritoneum were opened parallel to the IP ligament to open the retroperitoneal spaces bilaterally. The SLN mapping was performed in bilateral pelvic basins. The para rectal and paravesical spaces were opened up entirely with careful dissection below the level of the ureters bilaterally and  to the depth of the uterine artery origin in order to skeletonize the uterine "web" and ensure visualization of all parametrial channels. The para-aortic basins were carefully exposed and evaluated for isolated para-aortic SLN's. Lymphatic channels were identified travelling to the following visualized sentinel lymph node's: right and left obturator SLN. These SLN's were separated from their surrounding lymphatic tissue, removed and sent for permanent pathology.  The hysterectomy was started after the round ligament on the right side was incised and the retroperitoneum was entered and the pararectal space was developed.  The ureter was noted to be on the medial leaf of the broad ligament.  The peritoneum above the ureter was incised and stretched and the infundibulopelvic ligament was skeletonized, cauterized and cut.  The posterior peritoneum was taken down to the level of the KOH ring.  The anterior peritoneum was also taken down.  The bladder flap was created to the level of the KOH ring.  The uterine artery on the right side was skeletonized, cauterized and cut in the normal manner.  A similar procedure was performed on the left.  The colpotomy was made and the uterus, cervix, bilateral ovaries and tubes were amputated and delivered through the vagina.  Pedicles were inspected and excellent hemostasis was achieved.    The colpotomy at the vaginal cuff was closed with Vicryl on a CT1 needle in a figure of 8 closure at the corners and running 0 v-loc suture at the apex in a double layer closure. Irrigation was used and excellent hemostasis was achieved.  At this point in the procedure was completed.  Robotic instruments were removed under direct visulaization.  The robot was undocked. The 10 mm ports were closed with Vicryl on a UR-5 needle and the fascia was closed with 0 Vicryl on a UR-5 needle.  The skin was closed with 4-0 Vicryl in a subcuticular manner.  Dermabond was applied.  Sponge, lap and needle  counts correct x 2.  The patient was taken to the recovery room in stable condition.  The vagina was swabbed with  minimal bleeding noted.   All instrument and needle counts were correct x  3.   The patient was transferred to the recovery room in a stable condition.  Donaciano Eva, MD

## 2020-12-29 NOTE — Interval H&P Note (Signed)
History and Physical Interval Note:  12/29/2020 9:29 AM  Stacie Russell  has presented today for surgery, with the diagnosis of ENDOMETRIAL CANCER.  The various methods of treatment have been discussed with the patient and family. After consideration of risks, benefits and other options for treatment, the patient has consented to  Procedure(s): XI ROBOTIC ASSISTED TOTAL HYSTERECTOMY WITH BILATERAL SALPINGO OOPHORECTOMY (Bilateral) SENTINEL NODE BIOPSY (N/A) as a surgical intervention.  The patient's history has been reviewed, patient examined, no change in status, stable for surgery.  I have reviewed the patient's chart and labs.  Questions were answered to the patient's satisfaction.     Thereasa Solo

## 2020-12-29 NOTE — Transfer of Care (Signed)
Immediate Anesthesia Transfer of Care Note  Patient: LUCILL MAUCK  Procedure(s) Performed: XI ROBOTIC ASSISTED TOTAL HYSTERECTOMY WITH BILATERAL SALPINGO OOPHORECTOMY (Bilateral Abdomen) SENTINEL NODE BIOPSY (N/A )  Patient Location: PACU  Anesthesia Type:General  Level of Consciousness: sedated, patient cooperative and responds to stimulation  Airway & Oxygen Therapy: Patient Spontanous Breathing and Patient connected to face mask oxygen  Post-op Assessment: Report given to RN and Post -op Vital signs reviewed and stable  Post vital signs: Reviewed and stable  Last Vitals:  Vitals Value Taken Time  BP    Temp    Pulse    Resp    SpO2      Last Pain:  Vitals:   12/29/20 0852  TempSrc:   PainSc: 0-No pain         Complications: No complications documented.

## 2020-12-29 NOTE — Anesthesia Procedure Notes (Signed)
Procedure Name: Intubation Performed by: Gean Maidens, CRNA Pre-anesthesia Checklist: Patient identified, Emergency Drugs available, Suction available, Patient being monitored and Timeout performed Patient Re-evaluated:Patient Re-evaluated prior to induction Oxygen Delivery Method: Circle system utilized Preoxygenation: Pre-oxygenation with 100% oxygen Induction Type: IV induction Ventilation: Mask ventilation without difficulty Laryngoscope Size: Mac and 4 Grade View: Grade I Tube type: Oral Tube size: 7.0 mm Number of attempts: 1 Airway Equipment and Method: Stylet Placement Confirmation: ETT inserted through vocal cords under direct vision,  positive ETCO2 and breath sounds checked- equal and bilateral Secured at: 21 cm Tube secured with: Tape Dental Injury: Teeth and Oropharynx as per pre-operative assessment

## 2020-12-30 ENCOUNTER — Encounter (HOSPITAL_COMMUNITY): Payer: Self-pay | Admitting: Gynecologic Oncology

## 2020-12-30 ENCOUNTER — Telehealth: Payer: Self-pay

## 2020-12-30 NOTE — Telephone Encounter (Signed)
[name] states that she is eating and  drinking well, has not passed gas but is taking senokot as prescribed. Rates pain as 3/10, tylenol is helping. Denies any fever or chills. Incisions are dry and intact. Patient states her groin is red and very uncomfortable.Spoke with MD, recommends cleansing the area thoroughly, not to wear tight clothing and apply hydrocortisone cream as needed to the groin externally.   Encouraged her to use her incentive spirometer and to complete all of her Macrobid.Patient verbalized understanding.  Pt aware of post op appointments as well as the office number [phone] and after hours number [phone] to call if she has any questions or concerns

## 2020-12-30 NOTE — Anesthesia Postprocedure Evaluation (Signed)
Anesthesia Post Note  Patient: Stacie Russell  Procedure(s) Performed: XI ROBOTIC ASSISTED TOTAL HYSTERECTOMY WITH BILATERAL SALPINGO OOPHORECTOMY (Bilateral Abdomen) SENTINEL NODE BIOPSY (N/A )     Patient location during evaluation: PACU Anesthesia Type: General Level of consciousness: awake and alert Pain management: pain level controlled Vital Signs Assessment: post-procedure vital signs reviewed and stable Respiratory status: spontaneous breathing, nonlabored ventilation, respiratory function stable and patient connected to nasal cannula oxygen Cardiovascular status: blood pressure returned to baseline and stable Postop Assessment: no apparent nausea or vomiting Anesthetic complications: no   No complications documented.  Last Vitals:  Vitals:   12/29/20 1630 12/29/20 1652  BP:  135/64  Pulse: 69 80  Resp: 17 20  Temp:  36.5 C  SpO2: 94% 92%    Last Pain:  Vitals:   12/29/20 1652  TempSrc: Oral  PainSc: 2                  Barnet Glasgow

## 2021-01-04 ENCOUNTER — Other Ambulatory Visit: Payer: Self-pay

## 2021-01-06 LAB — SURGICAL PATHOLOGY

## 2021-01-10 ENCOUNTER — Inpatient Hospital Stay (HOSPITAL_BASED_OUTPATIENT_CLINIC_OR_DEPARTMENT_OTHER): Payer: Medicare Other | Admitting: Gynecologic Oncology

## 2021-01-10 ENCOUNTER — Encounter: Payer: Self-pay | Admitting: Gynecologic Oncology

## 2021-01-10 DIAGNOSIS — C541 Malignant neoplasm of endometrium: Secondary | ICD-10-CM

## 2021-01-10 DIAGNOSIS — Z90722 Acquired absence of ovaries, bilateral: Secondary | ICD-10-CM

## 2021-01-10 DIAGNOSIS — Z90710 Acquired absence of both cervix and uterus: Secondary | ICD-10-CM

## 2021-01-10 DIAGNOSIS — Z7189 Other specified counseling: Secondary | ICD-10-CM

## 2021-01-10 NOTE — Progress Notes (Signed)
Gynecologic Oncology Telehealth Follow-up Note  I connected with [name] on 01/10/21 at  3:00 PM EDT by telephone and verified that I am speaking with the correct person using two identifiers.  I discussed the limitations, risks, security and privacy concerns of performing an evaluation and management service by telemedicine and the availability of in-person appointments. I also discussed with the patient that there may be a patient responsible charge related to this service. The patient expressed understanding and agreed to proceed.  Other persons participating in the visit and their role in the encounter: husband (listening).  Patient's location: home Provider's location: [place]  Chief Complaint:  Chief Complaint  Patient presents with  . counseling and coordination of care    Assessment/Plan:  [name]  is a 70 y.o.  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 I discussed her stage with the patient and the need for chemotherapy.  We will set her up to see Dr Alvy Bimler to discuss 6 cycles of carboplatin and paclitaxel.   HPI: [name] is a 70 year old P 2 who was seen in consultation at the request of Dr Ulanda Edison for evaluation and treatment of grade 3 endometrial cancer.   Her symptoms began in April 2022 with postmenopausal bleeding.  She saw Dr Ulanda Edison for a new patient visit because she had not seen a gynecologist for 8 preceding years on 12/09/2020.  Work-up of symptoms included Pap smear and endometrial biopsy. Endometrial sampling with an office [place] was performed on 12/09/2020 and showed a high grade endometrial carcinoma.  Pap smear performed at that visit showed atypical glandular cells suspicious for adenocarcinoma.  Interval Hx:  On 12/29/20 she underwent robotic assisted total hysterectomy, BSO, SLN biopsy. Intraoperative findings were significant for no gross extrauterine disease.  Surgery was uncomplicated.  Final pathology revealed a FIGO grade 2 endometrioid cancer with <50% of myometrial invasion, and no lymphovascular space invasion. One of two (the left obturator) lymph node was positive for micrometastatic disease. The tumor was MMR normal/MSI stable.  Since surgery she has done well with no complaints.   Current Meds:  Outpatient Encounter Medications as of 01/10/2021  Medication Sig  . acetaminophen (TYLENOL) 500 MG tablet Take 1,000 mg by mouth every 6 (six) hours as needed (pain.).  Marland Kitchen albuterol (VENTOLIN HFA) 108 (90 Base) MCG/ACT inhaler Inhale 2 puffs into the lungs every 6 (six) hours as needed for wheezing or shortness of breath.  Marland Kitchen aspirin EC 81 MG tablet Take 81 mg by mouth daily.  . budesonide-formoterol (SYMBICORT) 80-4.5 MCG/ACT inhaler Inhale 2 puffs into the lungs 2 (two) times daily. (Patient taking differently: Inhale 2 puffs into the lungs 2 (two) times daily as needed (respiratory issues.).)  . Calcium Carbonate-Vit D-Min (CALCIUM 1200 PO) Take 1 tablet by mouth in the morning.  . Cholecalciferol (VITAMIN D3) 2000 units TABS Take 2,000 Units by mouth in the morning.  . docusate sodium (COLACE) 100 MG capsule Take 100 mg by mouth 2 (two) times daily as needed (constipation.).  Marland Kitchen DULoxetine (CYMBALTA) 30 MG capsule Take 1 capsule (30 mg total) by mouth daily. (Patient taking differently: Take 30 mg by mouth in the morning.)  . furosemide (LASIX) 20 MG tablet Take 1 tablet (20 mg total) by mouth daily as needed for fluid.  Marland Kitchen levothyroxine (SYNTHROID) 50 MCG tablet TAKE 1 TABLET BY MOUTH  DAILY BEFORE BREAKFAST (Patient taking differently: Take 50 mcg by  mouth daily before breakfast.)  . losartan-hydrochlorothiazide (HYZAAR) 100-12.5 MG tablet Take 1 tablet by mouth daily. (Patient taking differently: Take 1 tablet by mouth in the morning.)  . nitrofurantoin, macrocrystal-monohydrate, (MACROBID) 100 MG capsule Take 1 capsule (100 mg total) by mouth 2  (two) times daily.  . Omega-3 Fatty Acids (FISH OIL) 1000 MG CAPS Take 1,000 mg by mouth in the morning.  Marland Kitchen oxybutynin (DITROPAN) 5 MG tablet Take 2 tablets (10 mg total) by mouth 2 (two) times daily.  Marland Kitchen oxyCODONE (OXY IR/ROXICODONE) 5 MG immediate release tablet Take 1 tablet (5 mg total) by mouth every 4 (four) hours as needed for severe pain. For AFTER surgery only, do not take and drive  . pantoprazole (PROTONIX) 40 MG tablet TAKE 1 TABLET BY MOUTH ONCE DAILY BEFORE BREAKFAST (Patient taking differently: Take 40 mg by mouth daily before breakfast.)  . polyethylene glycol (MIRALAX / GLYCOLAX) 17 g packet Take 17 g by mouth daily as needed (constipation,).  . senna-docusate (SENOKOT-S) 8.6-50 MG tablet Take 2 tablets by mouth at bedtime. For AFTER surgery, do not take if having diarrhea  . valACYclovir (VALTREX) 1000 MG tablet Take 1 tablet twice daily for 7 days.  If you have another flare, take 2 tablets twice a day for ONE day only per flareup. (Patient taking differently: Take 500 mg by mouth 2 (two) times daily as needed (fever blisters).)  . vitamin B-12 (CYANOCOBALAMIN) 1000 MCG tablet Take 1,000 mcg by mouth in the morning.   No facility-administered encounter medications on file as of 01/10/2021.    Allergy:  Allergies  Allergen Reactions  . Tramadol Other (See Comments)    Unsure of reaction    Social Hx:   Social History   Socioeconomic History  . Marital status: Married    Spouse name: Marcello Moores  . Number of children: 2  . Years of education: 50  . Highest education level: Associate degree: academic program  Occupational History  . Occupation: Retired  Tobacco Use  . Smoking status: Never Smoker  . Smokeless tobacco: Never Used  Vaping Use  . Vaping Use: Never used  Substance and Sexual Activity  . Alcohol use: No  . Drug use: No  . Sexual activity: Not Currently    Birth control/protection: Post-menopausal  Other Topics Concern  . Not on file  Social History  Narrative   Married and retired   No EtOH, tobacco or drug use   Social Determinants of Health   Financial Resource Strain: Low Risk   . Difficulty of Paying Living Expenses: Not hard at all  Food Insecurity: No Food Insecurity  . Worried About Charity fundraiser in the Last Year: Never true  . Ran Out of Food in the Last Year: Never true  Transportation Needs: No Transportation Needs  . Lack of Transportation (Medical): No  . Lack of Transportation (Non-Medical): No  Physical Activity: Not on file  Stress: Not on file  Social Connections: Socially Integrated  . Frequency of Communication with Friends and Family: More than three times a week  . Frequency of Social Gatherings with Friends and Family: More than three times a week  . Attends Religious Services: More than 4 times per year  . Active Member of Clubs or Organizations: Yes  . Attends Archivist Meetings: More than 4 times per year  . Marital Status: Married  Human resources officer Violence: Not on file    Past Surgical Hx:  Past Surgical History:  Procedure Laterality  Date  . ANAL FISSURE REPAIR    . CATARACT EXTRACTION  May 2009  . COLONOSCOPY  12/26/07  . ESOPHAGEAL DILATION    . ESOPHAGOGASTRODUODENOSCOPY  12/26/07, 05/13/08  . EYE SURGERY Bilateral    bilateral cataract with lens implants  . KNEE CARTILAGE SURGERY Right Sept 1999  . ORIF HUMERUS FRACTURE Left 01/12/2020   Procedure: OPEN REDUCTION INTERNAL FIXATION (ORIF) LEFT HUMERUS FRACTURE;  Surgeon: Roseanne Kaufman, MD;  Location: [facility];  Service: Orthopedics;  Laterality: Left;  . ROBOTIC ASSISTED TOTAL HYSTERECTOMY WITH BILATERAL SALPINGO OOPHERECTOMY Bilateral 12/29/2020   Procedure: XI ROBOTIC ASSISTED TOTAL HYSTERECTOMY WITH BILATERAL SALPINGO OOPHORECTOMY;  Surgeon: Everitt Amber, MD;  Location: [facility];  Service: Gynecology;  Laterality: Bilateral;  . SENTINEL NODE BIOPSY N/A 12/29/2020   Procedure: SENTINEL NODE BIOPSY;  Surgeon: Everitt Amber, MD;   Location: [facility];  Service: Gynecology;  Laterality: N/A;  . 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 UMBILICAL HERNIA REPAIR  05/20/06    Past Medical Hx:  Past Medical History:  Diagnosis Date  . Arthritis   . Asthma   . Diverticulosis   . Dyspnea    with exertion   . Endometrial hyperplasia   . Esophageal stricture   . Foot pain   . Fundic gland polyps of stomach, benign 07/10/2015  . Gastritis   . GERD (gastroesophageal reflux disease)   . Hemorrhoid   . Hiatal hernia   . Hip pain   . History of kidney stones    cystoscopy  . History of surgery on arm    left humerus  . Hyperlipidemia   . Hypertension   . Hypothyroidism   . Knee pain   . Metabolic syndrome   . Obstructive sleep apnea 08/27/2017   pt denies   . Oxygen deficiency    pt denies  . PONV (postoperative nausea and vomiting)   . Spinal headache    few headaches recently   . Tendonitis of ankle   . Vitamin D deficiency     Past Gynecological History: See HPI, 2 prior vaginal deliveries, no history of abnormal Paps. No LMP recorded. Patient is postmenopausal.  Family Hx:  Family History  Problem Relation Age of Onset  . Cancer Mother        Liver, stomach, colon  . Colon cancer Mother   . Heart attack Father   . Heart disease Sister   . Breast cancer Sister   . Breast cancer Sister   . Esophageal cancer Neg Hx   . Stomach cancer Neg Hx   . Rectal cancer Neg Hx     Review of Systems: Constitutional  Feels well,    ENT Normal appearing ears and nares bilaterally Skin/Breast  No rash, sores, jaundice, itching, dryness Cardiovascular  No chest pain, shortness of breath, or edema  Pulmonary  No cough or wheeze.  Gastro Intestinal  No nausea, vomitting, or diarrhoea. No bright red blood per rectum, no abdominal pain, change in bowel  movement, or constipation.  Genito Urinary  No frequency, urgency, dysuria, no bleeding Musculo Skeletal  No myalgia, arthralgia, joint swelling or pain  Neurologic  No weakness, numbness, change in gait,  Psychology  No depression, anxiety, insomnia.   Vitals:  There were no vitals taken for this visit.  Physical Exam: deferred  60 minutes of total time was  spent for this patient encounter, including preparation, face-to-face counseling with the patient and coordination of care, and documentation of the encounter.   Thereasa Solo, MD  01/10/2021, 3:29 PM

## 2021-01-11 ENCOUNTER — Telehealth: Payer: Self-pay | Admitting: Oncology

## 2021-01-11 DIAGNOSIS — C541 Malignant neoplasm of endometrium: Secondary | ICD-10-CM

## 2021-01-11 DIAGNOSIS — N8500 Endometrial hyperplasia, unspecified: Secondary | ICD-10-CM

## 2021-01-11 NOTE — Telephone Encounter (Signed)
Called Pam and scheduled new patient appointment with Dr. Alvy Bimler on 01/23/21 at 1:20.  Advised her to arrive 30 minutes early and to bring a friend/family member with her.  Also encouraged her to call with any questions.

## 2021-01-13 ENCOUNTER — Ambulatory Visit: Payer: Medicare Other | Admitting: Internal Medicine

## 2021-01-13 ENCOUNTER — Encounter: Payer: Self-pay | Admitting: Internal Medicine

## 2021-01-13 VITALS — BP 128/82 | HR 76 | Ht 60.75 in | Wt 237.5 lb

## 2021-01-13 DIAGNOSIS — R1319 Other dysphagia: Secondary | ICD-10-CM | POA: Diagnosis not present

## 2021-01-13 DIAGNOSIS — Z800 Family history of malignant neoplasm of digestive organs: Secondary | ICD-10-CM

## 2021-01-13 DIAGNOSIS — R066 Hiccough: Secondary | ICD-10-CM | POA: Diagnosis not present

## 2021-01-13 DIAGNOSIS — K644 Residual hemorrhoidal skin tags: Secondary | ICD-10-CM

## 2021-01-13 DIAGNOSIS — C541 Malignant neoplasm of endometrium: Secondary | ICD-10-CM

## 2021-01-13 MED ORDER — ESOMEPRAZOLE MAGNESIUM 40 MG PO CPDR: 40.0000 mg | DELAYED_RELEASE_CAPSULE | Freq: Every day | ORAL | 3 refills | Status: DC

## 2021-01-13 NOTE — Patient Instructions (Addendum)
Stop your pantoprazole.  We have sent the following prescriptions to your mail in pharmacy:  esomeprazole  If you have not heard from your mail in pharmacy within 1 week or if you have not received your medication in the mail, please contact us at [phone] so we may find out why.   You will be contacted by [facility] in the next 2 days to arrange a Barium Swallow with tablet.  The number on your caller ID will be [phone], please answer when they call.  If you have not heard from them in 2 days please call [phone] to schedule.     You have been scheduled for an endoscopy and colonoscopy. Please follow the written instructions given to you at your visit today. Please pick up your prep supplies at the pharmacy within the next 1-3 days. If you use inhalers (even only as needed), please bring them with you on the day of your procedure.  We are giving you a GERD diet handout to read and follow today.   I appreciate the opportunity to care for you. Silvano Rusk, MD, [facility]

## 2021-01-13 NOTE — Progress Notes (Signed)
Stacie Russell 70 y.o. [birth year] [id number]  Assessment & Plan:   Encounter Diagnoses  Name Primary?  . Esophageal dysphagia Yes  . Family history of colon cancer in mother - dx 83's   . Hiccups   . Bleeding external hemorrhoids   . Endometrial cancer [facility])     She may have motility problem.  I am going to plan for an EGD and colonoscopy for the dysphagia and family history of colon cancer.  I think she had some bleeding from hemorrhoids recently and that should calm down as she really regulates her defecation pattern after her transient constipation issues.   Change PPI as below move from pantoprazole to Nexium generic.  She needs to be cautious and chew well in the interim regarding dysphagia.  The risks and benefits as well as alternatives of endoscopic procedure(s) have been discussed and reviewed. All questions answered. The patient agrees to proceed.  Reemphasized GERD diet.  She does not seem to drink a lot of caffeine or sodas.  Handout provided.  Obesity and particularly abdominal obesity reduction would help.  I did not broach that today but will try to at a later date.  She is dealing with a lot right now particularly the endometrial cancer which will require chemotherapy.   Orders Placed This Encounter  Procedures  . DG ESOPHAGUS W DOUBLE CM (HD)  . Ambulatory referral to Gastroenterology   Meds ordered this encounter  Medications  . esomeprazole (NEXIUM) 40 MG capsule    Sig: Take 1 capsule (40 mg total) by mouth daily before breakfast.    Dispense:  90 capsule    Refill:  3       I appreciate the opportunity to care for this patient. CC: Janora Norlander, DO   Subjective:   Chief Complaint: Dysphagia  HPI Stacie Russell is here with her husband she is complaining of dysphagia again.  She has had at least 2 upper endoscopies and has had empiric Maloney dilation which seems to help.  She has a tortuous esophagus without discrete stricture.  She is been  having problems again for the last year.  She is getting over a robotic laparoscopic hysterectomy for endometrial cancer and will have to have adjuvant chemotherapy.  She also had constipation in the perioperative period took some MiraLAX had some diarrhea and had some rectal bleeding 1 day.  Last colonoscopy was in 2016 and she was due last year given family history of colon cancer in her mother.    Wt Readings from Last 3 Encounters:  01/13/21 237 lb 8 oz (107.7 kg)  12/29/20 242 lb 1 oz (109.8 kg)  12/26/20 242 lb (109.8 kg)    CT abdomen and pelvis  IMPRESSION: 1. Lower uterine segment/cervical mass without evidence of metastatic disease. 2. Hepatic steatosis. 3. Aortic atherosclerosis (ICD10-I70.0). Coronary artery calcification.   Electronically Signed   By: Lorin Picket M.D.   On: 12/26/2020 13:23  Allergies  Allergen Reactions  . Tramadol Other (See Comments)    Unsure of reaction   Current Meds  Medication Sig  . acetaminophen (TYLENOL) 500 MG tablet Take 1,000 mg by mouth as needed (pain.).  Marland Kitchen albuterol (VENTOLIN HFA) 108 (90 Base) MCG/ACT inhaler Inhale 2 puffs into the lungs every 6 (six) hours as needed for wheezing or shortness of breath. (Patient taking differently: Inhale 2 puffs into the lungs as needed for wheezing or shortness of breath.)  . budesonide-formoterol (SYMBICORT) 80-4.5 MCG/ACT inhaler Inhale 2 puffs into  the lungs 2 (two) times daily. (Patient taking differently: Inhale 2 puffs into the lungs as needed (respiratory issues.).)  . Calcium Carbonate-Vit D-Min (CALCIUM 1200 PO) Take 1 tablet by mouth in the morning.  . Cholecalciferol (VITAMIN D3) 2000 units TABS Take 2,000 Units by mouth in the morning.  . docusate sodium (COLACE) 100 MG capsule Take 100 mg by mouth 2 (two) times daily as needed (constipation.).  Marland Kitchen DULoxetine (CYMBALTA) 30 MG capsule Take 1 capsule (30 mg total) by mouth daily. (Patient taking differently: Take 30 mg by mouth in  the morning.)  . esomeprazole (NEXIUM) 40 MG capsule Take 1 capsule (40 mg total) by mouth daily before breakfast.  . furosemide (LASIX) 20 MG tablet Take 1 tablet (20 mg total) by mouth daily as needed for fluid. (Patient taking differently: Take 20 mg by mouth as needed for fluid.)  . levothyroxine (SYNTHROID) 50 MCG tablet TAKE 1 TABLET BY MOUTH  DAILY BEFORE BREAKFAST (Patient taking differently: Take 50 mcg by mouth daily before breakfast.)  . losartan-hydrochlorothiazide (HYZAAR) 100-12.5 MG tablet Take 1 tablet by mouth daily. (Patient taking differently: Take 1 tablet by mouth in the morning.)  . Omega-3 Fatty Acids (FISH OIL) 1000 MG CAPS Take 1,000 mg by mouth in the morning.  Marland Kitchen oxybutynin (DITROPAN) 5 MG tablet Take 2 tablets (10 mg total) by mouth 2 (two) times daily.  . valACYclovir (VALTREX) 1000 MG tablet Take 1 tablet twice daily for 7 days.  If you have another flare, take 2 tablets twice a day for ONE day only per flareup. (Patient taking differently: Take 500 mg by mouth 2 (two) times daily as needed (fever blisters).)  . vitamin B-12 (CYANOCOBALAMIN) 1000 MCG tablet Take 1,000 mcg by mouth in the morning.  . [DISCONTINUED] pantoprazole (PROTONIX) 40 MG tablet TAKE 1 TABLET BY MOUTH ONCE DAILY BEFORE BREAKFAST (Patient taking differently: Take 40 mg by mouth daily before breakfast.)   Past Medical History:  Diagnosis Date  . Arthritis   . Asthma   . Diverticulosis   . Dyspnea    with exertion   . Endometrial hyperplasia   . Esophageal stricture   . Foot pain   . Fundic gland polyps of stomach, benign 07/10/2015  . Gastritis   . GERD (gastroesophageal reflux disease)   . Hemorrhoid   . Hiatal hernia   . Hip pain   . History of kidney stones    cystoscopy  . History of surgery on arm    left humerus  . Hyperlipidemia   . Hypertension   . Hypothyroidism   . Knee pain   . Metabolic syndrome   . Obstructive sleep apnea 08/27/2017   pt denies   . Oxygen deficiency     pt denies  . PONV (postoperative nausea and vomiting)   . Spinal headache    few headaches recently   . Tendonitis of ankle   . Vitamin D deficiency    Past Surgical History:  Procedure Laterality Date  . ANAL FISSURE REPAIR    . CATARACT EXTRACTION  May 2009  . COLONOSCOPY  12/26/07  . ESOPHAGEAL DILATION    . ESOPHAGOGASTRODUODENOSCOPY  12/26/07, 05/13/08  . EYE SURGERY Bilateral    bilateral cataract with lens implants  . KNEE CARTILAGE SURGERY Right Sept 1999  . ORIF HUMERUS FRACTURE Left 01/12/2020   Procedure: OPEN REDUCTION INTERNAL FIXATION (ORIF) LEFT HUMERUS FRACTURE;  Surgeon: Roseanne Kaufman, MD;  Location: [place];  Service: Orthopedics;  Laterality: Left;  .  ROBOTIC ASSISTED TOTAL HYSTERECTOMY WITH BILATERAL SALPINGO OOPHERECTOMY Bilateral 12/29/2020   Procedure: XI ROBOTIC ASSISTED TOTAL HYSTERECTOMY WITH BILATERAL SALPINGO OOPHORECTOMY;  Surgeon: Everitt Amber, MD;  Location: [facility];  Service: Gynecology;  Laterality: Bilateral;  . SENTINEL NODE BIOPSY N/A 12/29/2020   Procedure: SENTINEL NODE BIOPSY;  Surgeon: Everitt Amber, MD;  Location: [facility];  Service: Gynecology;  Laterality: N/A;  . 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 UMBILICAL HERNIA REPAIR  05/20/06   Social History   Social History Narrative   Married and retired   No EtOH, tobacco or drug use   family history includes Breast cancer in her sister and sister; Cancer in her mother; Colon cancer in her mother; Heart attack in her father; Heart disease in her sister.   Review of Systems As per HPI otherwise negative.  Objective:   Physical Exam BP 128/82 (BP Location: Left Arm, Patient Position: Sitting, Cuff Size: Normal)   Pulse 76   Ht 5' 0.75" (1.543 m) Comment: height measured without shoes  Wt 237 lb 8 oz (107.7 kg)   BMI 45.25 kg/m   Obese  elderly ww NAD Lungs cta Cor Nl s1s2 no rmg abd obese soft and NT - laparoscpy scars and small ecchymoses   Rectal Patti Martinique, CMA present.   Anoscopy was performed with the patient in the left lateral decubitus position while a chaperone was present and revealed mildly inflamed grade 1 ext hems and intact grade 1 internal hemorrhoids without inflammation

## 2021-01-16 ENCOUNTER — Other Ambulatory Visit: Payer: Self-pay | Admitting: Oncology

## 2021-01-16 ENCOUNTER — Telehealth: Payer: Self-pay | Admitting: Oncology

## 2021-01-16 DIAGNOSIS — C541 Malignant neoplasm of endometrium: Secondary | ICD-10-CM

## 2021-01-16 NOTE — Telephone Encounter (Signed)
Called Pam regarding genetic counseling referral.  She would like to schedule it due to her family history.  Appointment scheduled for 01/26/21 at 10:00.

## 2021-01-16 NOTE — Progress Notes (Signed)
Gynecologic Oncology Multi-Disciplinary Disposition Conference Note  Date of the Conference: 01/16/2021  Patient Name: Stacie Russell  Referring Provider: Dr. Ulanda Edison Primary GYN Oncologist: Dr. Denman George  Stage/Disposition:  Stage IIIC grade 2. Disposition is to chemotherapy with 6 cycles of carboplatin and paclitaxel. Consideration for radiation in the future for recurrence if needed.   This Multidisciplinary conference took place involving physicians from [place], [place], Radiation Oncology, Pathology, Radiology along with the Gynecologic Oncology Nurse Practitioner and RN.  Comprehensive assessment of the patient's malignancy, staging, need for surgery, chemotherapy, radiation therapy, and need for further testing were reviewed. Supportive measures, both inpatient and following discharge were also discussed. The recommended plan of care is documented. Greater than 35 minutes were spent correlating and coordinating this patient's care.

## 2021-01-19 ENCOUNTER — Inpatient Hospital Stay: Payer: Medicare Other

## 2021-01-19 ENCOUNTER — Inpatient Hospital Stay: Payer: Medicare Other | Attending: Gynecologic Oncology | Admitting: Gynecologic Oncology

## 2021-01-19 ENCOUNTER — Encounter: Payer: Self-pay | Admitting: Gynecologic Oncology

## 2021-01-19 ENCOUNTER — Other Ambulatory Visit: Payer: Self-pay

## 2021-01-19 VITALS — BP 129/70 | HR 80 | Temp 97.1°F | Resp 18 | Ht 60.0 in | Wt 232.6 lb

## 2021-01-19 DIAGNOSIS — C541 Malignant neoplasm of endometrium: Secondary | ICD-10-CM | POA: Diagnosis present

## 2021-01-19 DIAGNOSIS — E785 Hyperlipidemia, unspecified: Secondary | ICD-10-CM | POA: Diagnosis not present

## 2021-01-19 DIAGNOSIS — Z6841 Body Mass Index (BMI) 40.0 and over, adult: Secondary | ICD-10-CM | POA: Insufficient documentation

## 2021-01-19 DIAGNOSIS — Z90722 Acquired absence of ovaries, bilateral: Secondary | ICD-10-CM | POA: Insufficient documentation

## 2021-01-19 DIAGNOSIS — Z79899 Other long term (current) drug therapy: Secondary | ICD-10-CM | POA: Diagnosis not present

## 2021-01-19 DIAGNOSIS — R350 Frequency of micturition: Secondary | ICD-10-CM

## 2021-01-19 DIAGNOSIS — I10 Essential (primary) hypertension: Secondary | ICD-10-CM | POA: Insufficient documentation

## 2021-01-19 DIAGNOSIS — R3915 Urgency of urination: Secondary | ICD-10-CM | POA: Insufficient documentation

## 2021-01-19 DIAGNOSIS — E669 Obesity, unspecified: Secondary | ICD-10-CM | POA: Insufficient documentation

## 2021-01-19 DIAGNOSIS — Z90710 Acquired absence of both cervix and uterus: Secondary | ICD-10-CM | POA: Diagnosis not present

## 2021-01-19 DIAGNOSIS — E039 Hypothyroidism, unspecified: Secondary | ICD-10-CM | POA: Diagnosis not present

## 2021-01-19 DIAGNOSIS — Z5111 Encounter for antineoplastic chemotherapy: Secondary | ICD-10-CM | POA: Diagnosis not present

## 2021-01-19 NOTE — Progress Notes (Signed)
Gynecologic Oncology Follow-up Note  Chief Complaint:  Chief Complaint  Patient presents with   Endometrial cancer [facility])    Assessment/Plan:  [name]  is a 70 y.o.  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 I discussed her stage with the patient and the need for chemotherapy. We discussed prognosis and likelihood of recurrence.  We will set her up to see Dr Alvy Bimler to discuss 6 cycles of carboplatin and paclitaxel.   She continues to have urinary symptoms (these preceded her surgery). We will check a urine culture and treat with the appropriate abx if positive. If negative, we will have her be evaluated by [facility] for possible urge or stress incontinence.  Given that there is no leakage from her vagina, I do not believe that she has a fistula postop.   HPI: [name] is a 70 year old P 2 who was seen in consultation at the request of Dr Ulanda Edison for evaluation and treatment of grade 3 endometrial cancer.   Her symptoms began in April 2022 with postmenopausal bleeding.  She saw Dr Ulanda Edison on 12/09/2020 for a new patient visit because she had not seen a gynecologist for 8 preceding years.  Work-up of symptoms included Pap smear and endometrial biopsy. Endometrial sampling with an office [place] was performed on 12/09/2020 and showed a high grade endometrial carcinoma.  Pap smear performed at that visit showed atypical glandular cells suspicious for adenocarcinoma.  Interval Hx:  On 12/29/20 she underwent robotic assisted total hysterectomy, BSO, SLN biopsy. Intraoperative findings were significant for no gross extrauterine disease. Surgery was uncomplicated.  Final pathology revealed a FIGO grade 2 endometrioid cancer with <50% of myometrial invasion, and no lymphovascular space invasion. One of two (the left obturator) lymph node was positive for micrometastatic disease. The tumor was MMR normal/MSI stable. She was  assigned a stage IIIC1 endometrioid endometrial adenocarcinoma (grade 2).   Her case was presented at multidisciplinary tumor board conference and she was recommended to receive 6 cycles of adjuvant carboplatin and paclitaxel in accordance with NCCN guidelines. In accordance with GOG258 she was not recommended to receive adjuvant radiation given the lack of high risk features in her uterine specimen.   Since surgery she has done well but has symptoms of urinary frequency, urgency and leakage. She had a UTI diagnosed preop and suspects that she still has this.      Current Meds:  Outpatient Encounter Medications as of 01/19/2021  Medication Sig   acetaminophen (TYLENOL) 500 MG tablet Take 1,000 mg by mouth as needed (pain.).   albuterol (VENTOLIN HFA) 108 (90 Base) MCG/ACT inhaler Inhale 2 puffs into the lungs every 6 (six) hours as needed for wheezing or shortness of breath. (Patient taking differently: Inhale 2 puffs into the lungs as needed for wheezing or shortness of breath.)   aspirin EC 81 MG tablet Take 81 mg by mouth daily. (Patient not taking: Reported on 01/13/2021)   budesonide-formoterol (SYMBICORT) 80-4.5 MCG/ACT inhaler Inhale 2 puffs into the lungs 2 (two) times daily. (Patient taking differently: Inhale 2 puffs into the lungs as needed (respiratory issues.).)   Calcium Carbonate-Vit D-Min (CALCIUM 1200 PO) Take 1 tablet by mouth in the morning.   Cholecalciferol (VITAMIN D3) 2000 units TABS Take 2,000 Units by mouth in the morning.   docusate sodium (COLACE) 100 MG capsule Take 100 mg by mouth 2 (two) times daily as needed (constipation.).   DULoxetine (CYMBALTA) 30 MG capsule  Take 1 capsule (30 mg total) by mouth daily. (Patient taking differently: Take 30 mg by mouth in the morning.)   esomeprazole (NEXIUM) 40 MG capsule Take 1 capsule (40 mg total) by mouth daily before breakfast.   furosemide (LASIX) 20 MG tablet Take 1 tablet (20 mg total) by mouth daily as needed for fluid.  (Patient taking differently: Take 20 mg by mouth as needed for fluid.)   levothyroxine (SYNTHROID) 50 MCG tablet TAKE 1 TABLET BY MOUTH  DAILY BEFORE BREAKFAST (Patient taking differently: Take 50 mcg by mouth daily before breakfast.)   losartan-hydrochlorothiazide (HYZAAR) 100-12.5 MG tablet Take 1 tablet by mouth daily. (Patient taking differently: Take 1 tablet by mouth in the morning.)   Omega-3 Fatty Acids (FISH OIL) 1000 MG CAPS Take 1,000 mg by mouth in the morning.   oxybutynin (DITROPAN) 5 MG tablet Take 2 tablets (10 mg total) by mouth 2 (two) times daily.   valACYclovir (VALTREX) 1000 MG tablet Take 1 tablet twice daily for 7 days.  If you have another flare, take 2 tablets twice a day for ONE day only per flareup. (Patient taking differently: Take 500 mg by mouth 2 (two) times daily as needed (fever blisters).)   vitamin B-12 (CYANOCOBALAMIN) 1000 MCG tablet Take 1,000 mcg by mouth in the morning.   No facility-administered encounter medications on file as of 01/19/2021.    Allergy:  Allergies  Allergen Reactions   Tramadol Other (See Comments)    Unsure of reaction    Social Hx:   Social History   Socioeconomic History   Marital status: Married    Spouse name: Marcello Moores   Number of children: 2   Years of education: 14   Highest education level: Associate degree: academic program  Occupational History   Occupation: Retired  Tobacco Use   Smoking status: Never   Smokeless tobacco: Never  Scientific laboratory technician Use: Never used  Substance and Sexual Activity   Alcohol use: No   Drug use: No   Sexual activity: Not Currently    Birth control/protection: Post-menopausal  Other Topics Concern   Not on file  Social History Narrative   Married and retired   No EtOH, tobacco or drug use   Social Determinants of Radio broadcast assistant Strain: Low Risk    Difficulty of Paying Living Expenses: Not hard at all  Food Insecurity: No Food Insecurity   Worried About Paediatric nurse in the Last Year: Never true   Arboriculturist in the Last Year: Never true  Transportation Needs: No Transportation Needs   Lack of Transportation (Medical): No   Lack of Transportation (Non-Medical): No  Physical Activity: Not on file  Stress: Not on file  Social Connections: Socially Integrated   Frequency of Communication with Friends and Family: More than three times a week   Frequency of Social Gatherings with Friends and Family: More than three times a week   Attends Religious Services: More than 4 times per year   Active Member of Clubs or Organizations: Yes   Attends Archivist Meetings: More than 4 times per year   Marital Status: Married  Human resources officer Violence: Not on file    Past Surgical Hx:  Past Surgical History:  Procedure Laterality Date   ANAL FISSURE REPAIR     CATARACT EXTRACTION  May 2009   COLONOSCOPY  12/26/07   ESOPHAGEAL DILATION     ESOPHAGOGASTRODUODENOSCOPY  12/26/07, 05/13/08  EYE SURGERY Bilateral    bilateral cataract with lens implants   KNEE CARTILAGE SURGERY Right Sept 1999   ORIF HUMERUS FRACTURE Left 01/12/2020   Procedure: OPEN REDUCTION INTERNAL FIXATION (ORIF) LEFT HUMERUS FRACTURE;  Surgeon: Roseanne Kaufman, MD;  Location: [place];  Service: Orthopedics;  Laterality: Left;   ROBOTIC ASSISTED TOTAL HYSTERECTOMY WITH BILATERAL SALPINGO OOPHERECTOMY Bilateral 12/29/2020   Procedure: XI ROBOTIC ASSISTED TOTAL HYSTERECTOMY WITH BILATERAL SALPINGO OOPHORECTOMY;  Surgeon: Everitt Amber, MD;  [facility];  Service: Gynecology;  Laterality: Bilateral;   SENTINEL NODE BIOPSY N/A 12/29/2020   Procedure: SENTINEL NODE BIOPSY;  Surgeon: Everitt Amber, MD;  [facility];  Service: Gynecology;  Laterality: N/A;   TOTAL KNEE ARTHROPLASTY Right 10/07/2017   Procedure: RIGHT TOTAL KNEE ARTHROPLASTY;  Surgeon: Gaynelle Arabian, MD;  [facility];  Service: Orthopedics;  Laterality: Right;  Adductor Block   UMBILICAL HERNIA REPAIR   05/20/06    Past Medical Hx:  Past Medical History:  Diagnosis Date   Arthritis    Asthma    Diverticulosis    Dyspnea    with exertion    Endometrial hyperplasia    Esophageal stricture    Foot pain    Fundic gland polyps of stomach, benign 07/10/2015   Gastritis    GERD (gastroesophageal reflux disease)    Hemorrhoid    Hiatal hernia    Hip pain    History of kidney stones    cystoscopy   History of surgery on arm    left humerus   Hyperlipidemia    Hypertension    Hypothyroidism    Knee pain    Metabolic syndrome    Obstructive sleep apnea 08/27/2017   pt denies    Oxygen deficiency    pt denies   PONV (postoperative nausea and vomiting)    Spinal headache    few headaches recently    Tendonitis of ankle    Vitamin D deficiency     Past Gynecological History: See HPI, 2 prior vaginal deliveries, no history of abnormal Paps. No LMP recorded. Patient is postmenopausal.  Family Hx:  Family History  Problem Relation Age of Onset   Cancer Mother        Liver, stomach, colon   Colon cancer Mother    Heart attack Father    Heart disease Sister    Breast cancer Sister    Breast cancer Sister    Esophageal cancer Neg Hx    Stomach cancer Neg Hx    Rectal cancer Neg Hx     Review of Systems: Constitutional  Feels well,    ENT Normal appearing ears and nares bilaterally Skin/Breast  No rash, sores, jaundice, itching, dryness Cardiovascular  No chest pain, shortness of breath, or edema  Pulmonary  No cough or wheeze.  Gastro Intestinal  No nausea, vomitting, or diarrhoea. No bright red blood per rectum, no abdominal pain, change in bowel movement, or constipation.  Genito Urinary  + frequency & urgency & dysuria, + leakage of urine.  no bleeding Musculo Skeletal  No myalgia, arthralgia, joint swelling or pain  Neurologic  No weakness, numbness, change in gait,  Psychology  No depression, anxiety, insomnia.   Vitals:  Blood pressure 129/70, pulse  80, temperature (!) 97.1 F (36.2 C), temperature source Tympanic, resp. rate 18, height 5' (1.524 m), weight 232 lb 9.6 oz (105.5 kg), SpO2 100 %.  Physical Exam: WD in NAD Neck  Supple NROM, without any enlargements.  Lymph  Node Survey No cervical supraclavicular or inguinal adenopathy Cardiovascular  Well perfused peripheries Lungs  No increased WOB Skin  No rash/lesions/breakdown  Psychiatry  Alert and oriented to person, place, and time  Abdomen  Normoactive bowel sounds, abdomen soft, non-tender and obese without evidence of hernia. Incisions well healed. Back No CVA tenderness Genito Urinary  Vulva/vagina:vaginal cuff smooth, in tact, no leakage of fluid, no bleeding.  Rectal  deferred Extremities  No bilateral cyanosis, clubbing or edema.   30 minutes of direct face to face counseling time was spent with the patient. This included discussion about prognosis, therapy recommendations and postoperative side effects and are beyond the scope of routine postoperative care.   Thereasa Solo, MD  01/19/2021, 4:23 PM

## 2021-01-19 NOTE — Patient Instructions (Signed)
Your cancer was a stage 3C cancer because it had already spread to the lymph nodes. Therefore it requires more than surgery to eradicate it.  Dr. Denman George is recommending 3 doses of chemotherapy to be given each cycle every 3 weeks.  This can start in approximately 1 to 2 weeks.  For your bladder symptoms she is recommending we tested her urine for urinary tract infection.  If this is present she will prescribe the most appropriate antibiotic.  If this is not present she will have you be evaluated by a bladder specialist to see if there is something wrong with the muscle of your bladder.  Dr. Denman George will see you back for follow-up after you have completed chemotherapy.

## 2021-01-21 LAB — URINE CULTURE

## 2021-01-22 ENCOUNTER — Encounter: Payer: Self-pay | Admitting: Hematology and Oncology

## 2021-01-23 ENCOUNTER — Telehealth: Payer: Self-pay

## 2021-01-23 ENCOUNTER — Inpatient Hospital Stay: Payer: Medicare Other | Admitting: Hematology and Oncology

## 2021-01-23 ENCOUNTER — Inpatient Hospital Stay: Payer: Medicare Other

## 2021-01-23 ENCOUNTER — Other Ambulatory Visit: Payer: Self-pay

## 2021-01-23 ENCOUNTER — Encounter: Payer: Self-pay | Admitting: Emergency Medicine

## 2021-01-23 VITALS — BP 135/59 | HR 71 | Temp 97.9°F | Resp 18 | Ht 60.0 in | Wt 250.0 lb

## 2021-01-23 DIAGNOSIS — E039 Hypothyroidism, unspecified: Secondary | ICD-10-CM | POA: Diagnosis not present

## 2021-01-23 DIAGNOSIS — R350 Frequency of micturition: Secondary | ICD-10-CM

## 2021-01-23 DIAGNOSIS — I10 Essential (primary) hypertension: Secondary | ICD-10-CM | POA: Diagnosis not present

## 2021-01-23 DIAGNOSIS — E785 Hyperlipidemia, unspecified: Secondary | ICD-10-CM | POA: Diagnosis not present

## 2021-01-23 DIAGNOSIS — M6289 Other specified disorders of muscle: Secondary | ICD-10-CM

## 2021-01-23 DIAGNOSIS — C541 Malignant neoplasm of endometrium: Secondary | ICD-10-CM | POA: Diagnosis not present

## 2021-01-23 DIAGNOSIS — R3915 Urgency of urination: Secondary | ICD-10-CM | POA: Diagnosis not present

## 2021-01-23 DIAGNOSIS — Z79899 Other long term (current) drug therapy: Secondary | ICD-10-CM | POA: Diagnosis not present

## 2021-01-23 DIAGNOSIS — Z5111 Encounter for antineoplastic chemotherapy: Secondary | ICD-10-CM | POA: Diagnosis not present

## 2021-01-23 MED ORDER — LIDOCAINE-PRILOCAINE 2.5-2.5 % EX CREA: TOPICAL_CREAM | CUTANEOUS | 3 refills | Status: DC

## 2021-01-23 MED ORDER — PROCHLORPERAZINE MALEATE 10 MG PO TABS: 10.0000 mg | ORAL_TABLET | Freq: Four times a day (QID) | ORAL | 1 refills | Status: DC | PRN

## 2021-01-23 MED ORDER — DEXAMETHASONE 4 MG PO TABS: ORAL_TABLET | ORAL | 6 refills | Status: DC

## 2021-01-23 MED ORDER — ONDANSETRON HCL 8 MG PO TABS: 8.0000 mg | ORAL_TABLET | Freq: Two times a day (BID) | ORAL | 1 refills | Status: DC | PRN

## 2021-01-23 NOTE — Telephone Encounter (Addendum)
Told Ms Klinker that the urine culture was contaminated. Will get another urine culture after Dr. Calton Dach appointment today.

## 2021-01-23 NOTE — Research (Signed)
A Randomized pragmatic Chair-Based Home Exercise Intervention for Mitigating Cancer-Related Fatigue in Older Adults Undergoing Chemotherapy for Advanced Disease  Patient Stacie Russell was referred by Dr. Alvy Bimler as a potential candidate for the above listed study.  This Clinical Research Coordinator met with Stacie Russell, [id number], on 01/23/21 in a manner and location that ensures patient privacy to discuss participation in the above listed research study.  Patient is Accompanied by her husband .  A copy of the informed consent document and separate HIPAA Authorization was provided to the patient.  Patient reads, speaks, and understands [place].   Patient was provided with the business card of this Coordinator and encouraged to contact the research team with any questions.  Approximately 15 minutes were spent with the patient reviewing the informed consent documents.  Patient was provided the option of taking informed consent documents home to review and was encouraged to review at their convenience with their support network, including other care providers. Patient took the consent documents home to review. Patient states she is interested in participating.  Will call the patient on Wednesday 01/25/21.  If she is interested, will plan to obtain consent on Thursday 01/26/21 when she comes to clinic for genetic counseling.  [name] Clinical Research Coordinator I  01/23/21 4:09 PM

## 2021-01-23 NOTE — Progress Notes (Signed)
START ON PATHWAY REGIMEN - Uterine     A cycle is every 21 days:     Paclitaxel      Carboplatin   **Always confirm dose/schedule in your pharmacy ordering system**  Patient Characteristics: Endometrioid, Newly Diagnosed, Postoperative (Pathologic Staging), Stage IIIC1/IIIC2 - Grade 1, 2, or 3 Histology: Endometrioid Therapeutic Status: Newly Diagnosed, Postoperative (Pathologic Staging) AJCC M Category: cM0 AJCC 8 Stage Grouping: IIIC1 AJCC T Category: pT1b AJCC N Category: pN21mi Intent of Therapy: Curative Intent, Discussed with Patient

## 2021-01-24 ENCOUNTER — Other Ambulatory Visit: Payer: Self-pay | Admitting: Student

## 2021-01-24 ENCOUNTER — Encounter: Payer: Self-pay | Admitting: Hematology and Oncology

## 2021-01-24 LAB — URINE CULTURE

## 2021-01-24 NOTE — Progress Notes (Signed)
[facility] NOTE  Patient Care Team: Janora Norlander, DO as PCP - General (Family Medicine) Lorretta Harp, MD as PCP - Cardiology (Cardiology) Irine Seal, MD (Urology) Paula Compton, MD (Obstetrics and Gynecology) Gatha Mayer, MD (Gastroenterology) Gaynelle Arabian, MD as Consulting Physician (Orthopedic Surgery) Ilean China, RN as Registered Nurse  ASSESSMENT & PLAN:  Endometrial cancer [facility]) We reviewed the NCCN guidelines We discussed the role of chemotherapy. The intent is of curative intent.  We discussed some of the risks, benefits, side-effects of carboplatin & Taxol. Treatment is intravenous, every 3 weeks x 6 cycles  Some of the short term side-effects included, though not limited to, including weight loss, life threatening infections, risk of allergic reactions, need for transfusions of blood products, nausea, vomiting, change in bowel habits, loss of hair, admission to hospital for various reasons, and risks of death.   Long term side-effects are also discussed including risks of infertility, permanent damage to nerve function, hearing loss, chronic fatigue, kidney damage with possibility needing hemodialysis, and rare secondary malignancy including bone marrow disorders.  The patient is aware that the response rates discussed earlier is not guaranteed.  After a long discussion, patient made an informed decision to proceed with the prescribed plan of care.   Patient education material was dispensed. We discussed premedication with dexamethasone before chemotherapy. I recommend port placement and chemo education class We will proceed with treatment around 2 weeks from now  Obesity, Class III, BMI 40-49.9 (morbid obesity) ([place]) We discussed importance of dietary modification while on treatment Due to high doses of calculated chemotherapy I will keep carboplatin at 750 mg and plan slightly upfront dose adjustment of Taxol She is  interested to participate in research trial involving chair exercise and I will get research nurse to talk to the patient  Orders Placed This Encounter  Procedures   IR IMAGING GUIDED PORT INSERTION    Standing Status:   Future    Standing Expiration Date:   01/23/2022    Order Specific Question:   Reason for Exam (SYMPTOM  OR DIAGNOSIS REQUIRED)    Answer:   need chemo to start 6/27    Order Specific Question:   Preferred Imaging Location?    Answer:   [facility]   CBC with Differential (Cancer Center Only)    Standing Status:   Standing    Number of Occurrences:   20    Standing Expiration Date:   01/23/2022   CMP ([place] only)    Standing Status:   Standing    Number of Occurrences:   20    Standing Expiration Date:   01/23/2022   Ambulatory referral to Physical Therapy    Referral Priority:   Routine    Referral Type:   Physical Medicine    Referral Reason:   Specialty Services Required    Requested Specialty:   Physical Therapy    Number of Visits Requested:   1    The total time spent in the appointment was 60 minutes encounter with patients including review of chart and various tests results, discussions about plan of care and coordination of care plan   All questions were answered. The patient knows to call the clinic with any problems, questions or concerns. No barriers to learning was detected.  Heath Lark, MD 6/14/20228:12 AM  CHIEF COMPLAINTS/PURPOSE OF CONSULTATION:  Urine cancer, for further management  HISTORY OF PRESENTING ILLNESS:  Stacie Russell 70 y.o. female  is here because of recent recent diagnosis of uterine cancer She is here accompanied by her husband, Stacie Russell She is doing well after surgery Her only complains of difficulties controlling her urination She is going to give another urine sample to rule out UTI today She complained of excessive fatigue  I have reviewed her chart and materials related to her cancer extensively and  collaborated history with the patient. Summary of oncologic history is as follows: Oncology History Overview Note  MSI stable, endometrioid grade 2   Endometrial cancer ([place])  12/19/2020 Initial Diagnosis   Endometrial cancer ([place])   12/26/2020 Imaging   1. Lower uterine segment/cervical mass without evidence of metastatic disease. 2. Hepatic steatosis. 3. Aortic atherosclerosis (ICD10-I70.0). Coronary artery calcification.   12/29/2020 Pathology Results   FINAL MICROSCOPIC DIAGNOSIS:   A. SENTINEL LYMPH NODE, RIGHT OBTURATOR, EXCISION:  -  No carcinoma identified in one lymph node (0/1)  -  See comment   B. SENTINEL LYMPH NODE, LEFT OBTURATOR, EXCISION:  -  Micrometastasis involving one lymph node (37m/1)  -  See comment   C. UTERUS, CERVIX, BILATERAL TUBES AND OVARIES:  Uterus:  -  Endometrioid carcinoma, FIGO grade 2  -  See oncology table and comment below   Cervix:  -  No carcinoma identified   Bilateral Ovaries:  -  No carcinoma identified   Bilateral Fallopian tubes:  -  No carcinoma identified   ONCOLOGY TABLE:   UTERUS, CARCINOMA OR CARCINOSARCOMA: Resection   Procedure: Total hysterectomy and bilateral salpingo-oophorectomy  Histologic Type: Endometrioid carcinoma  Histologic Grade: FIGO grade 2  Myometrial Invasion:       Percentage of Myometrial Invasion: Estimated to be less than 50%  Uterine Serosa Involvement: Not identified  Cervical stromal Involvement: Not identified  Extent of involvement of other tissue/organs: Not identified  Peritoneal/Ascitic Fluid: Not submitted/unknown  Lymphovascular Invasion: Not identified  Regional Lymph Nodes:       Pelvic Lymph Nodes Examined:  2 Sentinel                                0 non-sentinel                                   2 total       Pelvic Lymph Nodes with Metastasis: 0                           Macrometastasis: (>2.0 mm): 0                          Micrometastasis: (>0.2 mm and < 2.0 mm): 1                             Isolated Tumor Cells (<0.2 mm): 0                       Laterality of Lymph Node with Tumor: 0                             Extracapsular Extension: N/A  Distant Metastasis:       Distant Site(s) Involved: N/A  Pathologic Stage Classification (pTNM, AJCC 8th Edition): pT1a,  pN15m  Ancillary Studies: MMR / MSI testing will be ordered  Representative Tumor Block: C1  Comment(s): Cytokeratin AE1/3 was performed on the lymph nodes (parts a and B) and supports the presence of micrometastasis (part B only). Drs. PSaralyn Pilarand [facility] Increviewed select parts of the case and agree with the above diagnosis.   (v4.2.0.1)     12/29/2020 Surgery   Surgeon: RDonaciano EvaPre-operative Diagnosis: endometrial cancer grade 3    Operation: Robotic-assisted laparoscopic total hysterectomy with bilateral salpingoophorectomy, SLN biopsy   Surgeon: RDonaciano Eva Specimens: uterus, cervix, bilateral tubes and ovaries, right and left obturator SLN           01/22/2021 Cancer Staging   Staging form: Corpus Uteri - Carcinoma and Carcinosarcoma, AJCC 8th Edition - Pathologic stage from 01/22/2021: Stage IIIC1 (pT1a, pN124m cM0) - Signed by GoHeath LarkMD on 01/22/2021  Stage prefix: Initial diagnosis    02/07/2021 -  Chemotherapy    Patient is on Treatment Plan: UTERINE CARBOPLATIN AUC 6 / PACLITAXEL Q21D         MEDICAL HISTORY:  Past Medical History:  Diagnosis Date   Arthritis    Asthma    Diverticulosis    Dyspnea    with exertion    Endometrial hyperplasia    Esophageal stricture    Foot pain    Fundic gland polyps of stomach, benign 07/10/2015   Gastritis    GERD (gastroesophageal reflux disease)    Hemorrhoid    Hiatal hernia    Hip pain    History of kidney stones    cystoscopy   History of surgery on arm    left humerus   Hyperlipidemia    Hypertension    Hypothyroidism    Knee pain    Metabolic syndrome    Obstructive sleep apnea 08/27/2017    pt denies    Oxygen deficiency    pt denies   PONV (postoperative nausea and vomiting)    Spinal headache    few headaches recently    Tendonitis of ankle    Vitamin D deficiency     SURGICAL HISTORY: Past Surgical History:  Procedure Laterality Date   ANAL FISSURE REPAIR     CATARACT EXTRACTION  May 2009   COLONOSCOPY  12/26/07   ESOPHAGEAL DILATION     ESOPHAGOGASTRODUODENOSCOPY  12/26/07, 05/13/08   EYE SURGERY Bilateral    bilateral cataract with lens implants   KNEE CARTILAGE SURGERY Right Sept 1999   ORIF HUMERUS FRACTURE Left 01/12/2020   Procedure: OPEN REDUCTION INTERNAL FIXATION (ORIF) LEFT HUMERUS FRACTURE;  Surgeon: GrRoseanne KaufmanMD;  [facility]: [facility];  Laterality: Left;   ROBOTIC ASSISTED TOTAL HYSTERECTOMY WITH BILATERAL SALPINGO OOPHERECTOMY Bilateral 12/29/2020   Procedure: XI ROBOTIC ASSISTED TOTAL HYSTERECTOMY WITH BILATERAL SALPINGO OOPHORECTOMY;  Surgeon: RoEveritt AmberMD;  Location: [facility];  Service: Gynecology;  Laterality: Bilateral;   SENTINEL NODE BIOPSY N/A 12/29/2020   Procedure: SENTINEL NODE BIOPSY;  Surgeon: RoEveritt AmberMD;  Location: [facility];  Service: Gynecology;  Laterality: N/A;   TOTAL KNEE ARTHROPLASTY Right 10/07/2017   Procedure: RIGHT TOTAL KNEE ARTHROPLASTY;  Surgeon: AlGaynelle ArabianMD;  Location: [facility];  Service: Orthopedics;  Laterality: Right;  Adductor Block   UMBILICAL HERNIA REPAIR  05/20/06    SOCIAL HISTORY: Social History   Socioeconomic History   Marital status: Married    Spouse name: ThMarcello Moores Number of children: 2   Years of education: 1465  Highest education level: Associate degree: academic program  Occupational History   Occupation: Retired  Tobacco Use   Smoking status: Never   Smokeless tobacco: Never  Vaping Use   Vaping Use: Never used  Substance and Sexual Activity   Alcohol use: No   Drug use: No   Sexual activity: Not Currently    Birth control/protection: Post-menopausal  Other Topics  Concern   Not on file  Social History Narrative   Married and retired   No EtOH, tobacco or drug use   Social Determinants of Radio broadcast assistant Strain: Low Risk    Difficulty of Paying Living Expenses: Not hard at all  Food Insecurity: No Food Insecurity   Worried About Charity fundraiser in the Last Year: Never true   Arboriculturist in the Last Year: Never true  Transportation Needs: No Transportation Needs   Lack of Transportation (Medical): No   Lack of Transportation (Non-Medical): No  Physical Activity: Not on file  Stress: Not on file  Social Connections: Socially Integrated   Frequency of Communication with Friends and Family: More than three times a week   Frequency of Social Gatherings with Friends and Family: More than three times a week   Attends Religious Services: More than 4 times per year   Active Member of [employer] or Organizations: Yes   Attends Music therapist: More than 4 times per year   Marital Status: Married  Human resources officer Violence: Not on file    FAMILY HISTORY: Family History  Problem Relation Age of Onset   Cancer Mother        Liver, stomach, colon   Colon cancer Mother    Heart attack Father    Heart disease Sister    Breast cancer Sister    Breast cancer Sister    Esophageal cancer Neg Hx    Stomach cancer Neg Hx    Rectal cancer Neg Hx     ALLERGIES:  is allergic to tramadol.  MEDICATIONS:  Current Outpatient Medications  Medication Sig Dispense Refill   dexamethasone (DECADRON) 4 MG tablet Take 2 tabs at the night before and 2 tab the morning of chemotherapy, every 3 weeks, by mouth x 6 cycles 36 tablet 6   acetaminophen (TYLENOL) 500 MG tablet Take 1,000 mg by mouth as needed (pain.).     albuterol (VENTOLIN HFA) 108 (90 Base) MCG/ACT inhaler Inhale 2 puffs into the lungs every 6 (six) hours as needed for wheezing or shortness of breath. (Patient taking differently: Inhale 2 puffs into the lungs as needed for  wheezing or shortness of breath.) 6.7 g 1   budesonide-formoterol (SYMBICORT) 80-4.5 MCG/ACT inhaler Inhale 2 puffs into the lungs 2 (two) times daily. (Patient taking differently: Inhale 2 puffs into the lungs as needed (respiratory issues.).) 4 each 11   Calcium Carbonate-Vit D-Min (CALCIUM 1200 PO) Take 1 tablet by mouth in the morning.     Cholecalciferol (VITAMIN D3) 2000 units TABS Take 2,000 Units by mouth in the morning.     docusate sodium (COLACE) 100 MG capsule Take 100 mg by mouth 2 (two) times daily as needed (constipation.).     DULoxetine (CYMBALTA) 30 MG capsule Take 1 capsule (30 mg total) by mouth daily. (Patient taking differently: Take 30 mg by mouth in the morning.) 90 capsule 3   esomeprazole (NEXIUM) 40 MG capsule Take 1 capsule (40 mg total) by mouth daily before breakfast. 90 capsule 3   furosemide (  LASIX) 20 MG tablet Take 1 tablet (20 mg total) by mouth daily as needed for fluid. (Patient taking differently: Take 20 mg by mouth as needed for fluid.) 90 tablet 1   levothyroxine (SYNTHROID) 50 MCG tablet TAKE 1 TABLET BY MOUTH  DAILY BEFORE BREAKFAST (Patient taking differently: Take 50 mcg by mouth daily before breakfast.) 90 tablet 3   lidocaine-prilocaine (EMLA) cream Apply to affected area once 30 g 3   losartan-hydrochlorothiazide (HYZAAR) 100-12.5 MG tablet Take 1 tablet by mouth daily. (Patient taking differently: Take 1 tablet by mouth in the morning.) 90 tablet 3   Omega-3 Fatty Acids (FISH OIL) 1000 MG CAPS Take 1,000 mg by mouth in the morning.     ondansetron (ZOFRAN) 8 MG tablet Take 1 tablet (8 mg total) by mouth 2 (two) times daily as needed. Start on the third day after chemotherapy. 30 tablet 1   oxybutynin (DITROPAN) 5 MG tablet Take 2 tablets (10 mg total) by mouth 2 (two) times daily. 360 tablet 3   prochlorperazine (COMPAZINE) 10 MG tablet Take 1 tablet (10 mg total) by mouth every 6 (six) hours as needed (Nausea or vomiting). 30 tablet 1   valACYclovir  (VALTREX) 1000 MG tablet Take 1 tablet twice daily for 7 days.  If you have another flare, take 2 tablets twice a day for ONE day only per flareup. (Patient taking differently: Take 500 mg by mouth 2 (two) times daily as needed (fever blisters).) 30 tablet 0   vitamin B-12 (CYANOCOBALAMIN) 1000 MCG tablet Take 1,000 mcg by mouth in the morning.     No current facility-administered medications for this visit.    REVIEW OF SYSTEMS:   Constitutional: Denies fevers, chills or abnormal night sweats Eyes: Denies blurriness of vision, double vision or watery eyes Ears, nose, mouth, throat, and face: Denies mucositis or sore throat Respiratory: Denies cough, dyspnea or wheezes Cardiovascular: Denies palpitation, chest discomfort or lower extremity swelling Gastrointestinal:  Denies nausea, heartburn or change in bowel habits Skin: Denies abnormal skin rashes Lymphatics: Denies new lymphadenopathy or easy bruising Neurological:Denies numbness, tingling or new weaknesses Behavioral/Psych: Mood is stable, no new changes  All other systems were reviewed with the patient and are negative.  PHYSICAL EXAMINATION: ECOG PERFORMANCE STATUS: 1 - Symptomatic but completely ambulatory  Vitals:   01/23/21 1318  BP: (!) 135/59  Pulse: 71  Resp: 18  Temp: 97.9 F (36.6 C)  SpO2: 100%   Filed Weights   01/23/21 1318  Weight: 250 lb (113.4 kg)    GENERAL:alert, no distress and comfortable SKIN: skin color, texture, turgor are normal, no rashes or significant lesions EYES: normal, conjunctiva are pink and non-injected, sclera clear OROPHARYNX:no exudate, no erythema and lips, buccal mucosa, and tongue normal  NECK: supple, thyroid normal size, non-tender, without nodularity LYMPH:  no palpable lymphadenopathy in the cervical, axillary or inguinal LUNGS: clear to auscultation and percussion with normal breathing effort HEART: regular rate & rhythm and no murmurs and no lower extremity  edema ABDOMEN:abdomen soft, non-tender and normal bowel sounds.  Noted well-healed surgical scar Musculoskeletal:no cyanosis of digits and no clubbing  PSYCH: alert & oriented x 3 with fluent speech NEURO: no focal motor/sensory deficits  LABORATORY DATA:  I have reviewed the data as listed Lab Results  Component Value Date   WBC 7.9 12/26/2020   HGB 13.8 12/26/2020   HCT 42.5 12/26/2020   MCV 94.0 12/26/2020   PLT 227 12/26/2020   Recent Labs    03/01/20  1055 08/31/20 1149 12/19/20 1432  NA 141 141 142  K 4.4 4.7 3.9  CL 104 102 103  CO2 _0 GLUCOSE 93 84 86  BUN _1 CREATININE 0.66 0.76 0.77  CALCIUM 9.5 10.1 9.7  GFRNONAA 90 79 >60  GFRAA 104 91  --   PROT 6.8 7.3 7.4  ALBUMIN 4.2 4.7 4.0  AST _2 ALT _3 ALKPHOS 112 135* 92  BILITOT 0.4 0.5 0.5    RADIOGRAPHIC STUDIES: I have personally reviewed the radiological images as listed and agreed with the findings in the report. CT Abdomen Pelvis Wo Contrast  Result Date: 12/26/2020 CLINICAL DATA:  Endometrial cancer. Evaluate for metastatic disease. EXAM: CT ABDOMEN AND PELVIS WITHOUT CONTRAST TECHNIQUE: Multidetector CT imaging of the abdomen and pelvis was performed following the standard protocol without IV contrast. COMPARISON:  11/20/2005. FINDINGS: Lower chest: Minimal scarring in the lung bases. Heart is at the upper limits of normal in size to mildly enlarged. Atherosclerotic calcification of the aorta, aortic valve and coronary arteries. No pericardial or pleural effusion. Small hiatal hernia. Hepatobiliary: Liver is somewhat heterogeneous in attenuation. Liver and gallbladder are otherwise unremarkable. No biliary ductal dilatation. Pancreas: Negative. Spleen: Negative. Adrenals/Urinary Tract: Adrenal glands and kidneys are unremarkable. Ureters are decompressed. Bladder is unremarkable. Stomach/Bowel: Small hiatal hernia. Stomach is relatively decompressed. Small bowel, appendix and colon  are unremarkable. Vascular/Lymphatic: Atherosclerotic calcification of the aorta. Retroaortic left renal vein. No pathologically enlarged lymph nodes. 7 mm left external iliac lymph node (2/70), unchanged. Reproductive: Fluid is seen in the lower uterine segment/cervical region with surrounding wall thickening. No adnexal mass. Other: No free fluid.  Mesenteries and peritoneum are unremarkable. Musculoskeletal: Degenerative changes in the spine. No worrisome lytic or sclerotic lesions. IMPRESSION: 1. Lower uterine segment/cervical mass without evidence of metastatic disease. 2. Hepatic steatosis. 3. Aortic atherosclerosis (ICD10-I70.0). Coronary artery calcification. Electronically Signed   By: Lorin Picket M.D.   On: 12/26/2020 13:23

## 2021-01-24 NOTE — Assessment & Plan Note (Signed)
We reviewed the NCCN guidelines We discussed the role of chemotherapy. The intent is of curative intent.  We discussed some of the risks, benefits, side-effects of carboplatin & Taxol. Treatment is intravenous, every 3 weeks x 6 cycles  Some of the short term side-effects included, though not limited to, including weight loss, life threatening infections, risk of allergic reactions, need for transfusions of blood products, nausea, vomiting, change in bowel habits, loss of hair, admission to hospital for various reasons, and risks of death.   Long term side-effects are also discussed including risks of infertility, permanent damage to nerve function, hearing loss, chronic fatigue, kidney damage with possibility needing hemodialysis, and rare secondary malignancy including bone marrow disorders.  The patient is aware that the response rates discussed earlier is not guaranteed.  After a long discussion, patient made an informed decision to proceed with the prescribed plan of care.   Patient education material was dispensed. We discussed premedication with dexamethasone before chemotherapy. I recommend port placement and chemo education class We will proceed with treatment around 2 weeks from now

## 2021-01-24 NOTE — Assessment & Plan Note (Signed)
We discussed importance of dietary modification while on treatment Due to high doses of calculated chemotherapy I will keep carboplatin at 750 mg and plan slightly upfront dose adjustment of Taxol She is interested to participate in research trial involving chair exercise and I will get research nurse to talk to the patient

## 2021-01-25 ENCOUNTER — Ambulatory Visit (HOSPITAL_COMMUNITY)
Admission: RE | Admit: 2021-01-25 | Discharge: 2021-01-25 | Disposition: A | Discharge status: Home / Self Care | Payer: Medicare Other | Source: Ambulatory Visit | Attending: Hematology and Oncology | Admitting: Hematology and Oncology

## 2021-01-25 ENCOUNTER — Telehealth: Payer: Self-pay | Admitting: Emergency Medicine

## 2021-01-25 ENCOUNTER — Other Ambulatory Visit: Payer: Self-pay

## 2021-01-25 ENCOUNTER — Ambulatory Visit (HOSPITAL_COMMUNITY): Payer: Medicare Other

## 2021-01-25 ENCOUNTER — Encounter (HOSPITAL_COMMUNITY): Payer: Self-pay

## 2021-01-25 ENCOUNTER — Encounter: Payer: Self-pay | Admitting: Hematology and Oncology

## 2021-01-25 DIAGNOSIS — Z79890 Hormone replacement therapy (postmenopausal): Secondary | ICD-10-CM | POA: Insufficient documentation

## 2021-01-25 DIAGNOSIS — Z7951 Long term (current) use of inhaled steroids: Secondary | ICD-10-CM | POA: Diagnosis not present

## 2021-01-25 DIAGNOSIS — C541 Malignant neoplasm of endometrium: Secondary | ICD-10-CM

## 2021-01-25 DIAGNOSIS — Z885 Allergy status to narcotic agent status: Secondary | ICD-10-CM | POA: Diagnosis not present

## 2021-01-25 DIAGNOSIS — Z79899 Other long term (current) drug therapy: Secondary | ICD-10-CM | POA: Insufficient documentation

## 2021-01-25 DIAGNOSIS — Z452 Encounter for adjustment and management of vascular access device: Secondary | ICD-10-CM | POA: Diagnosis not present

## 2021-01-25 HISTORY — PX: IR IMAGING GUIDED PORT INSERTION: IMG5740

## 2021-01-25 MED ORDER — SODIUM CHLORIDE 0.9 % IV SOLN: INTRAVENOUS | Status: DC

## 2021-01-25 MED ORDER — LIDOCAINE-EPINEPHRINE 1 %-1:100000 IJ SOLN
INTRAMUSCULAR | Status: AC
  Filled 2021-01-25: qty 1

## 2021-01-25 MED ORDER — HEPARIN SOD (PORK) LOCK FLUSH 100 UNIT/ML IV SOLN
INTRAVENOUS | Status: AC
  Filled 2021-01-25: qty 5

## 2021-01-25 MED ORDER — MIDAZOLAM HCL 2 MG/2ML IJ SOLN
INTRAMUSCULAR | Status: AC | PRN
  Administered 2021-01-25 (×2): 1 mg via INTRAVENOUS

## 2021-01-25 MED ORDER — FENTANYL CITRATE (PF) 100 MCG/2ML IJ SOLN
INTRAMUSCULAR | Status: AC | PRN
  Administered 2021-01-25 (×2): 50 ug via INTRAVENOUS

## 2021-01-25 MED ORDER — FENTANYL CITRATE (PF) 100 MCG/2ML IJ SOLN
INTRAMUSCULAR | Status: AC
  Filled 2021-01-25: qty 2

## 2021-01-25 MED ORDER — MIDAZOLAM HCL 2 MG/2ML IJ SOLN
INTRAMUSCULAR | Status: AC
  Filled 2021-01-25: qty 4

## 2021-01-25 MED ORDER — HEPARIN SOD (PORK) LOCK FLUSH 100 UNIT/ML IV SOLN
INTRAVENOUS | Status: AC | PRN
Start: 2021-01-25 — End: 2021-01-25
  Administered 2021-01-25: 500 [IU] via INTRAVENOUS

## 2021-01-25 MED ORDER — LIDOCAINE-EPINEPHRINE 1 %-1:100000 IJ SOLN
INTRAMUSCULAR | Status: AC | PRN
  Administered 2021-01-25 (×2): 10 mL via INTRADERMAL

## 2021-01-25 NOTE — Discharge Instructions (Signed)

## 2021-01-25 NOTE — Procedures (Signed)
Interventional Radiology Procedure Note  Procedure: Placement of a right IJ approach single lumen PowerPort.  Tip is positioned at the superior cavoatrial junction and catheter is ready for immediate use.   Complications: No immediate  EBL: None  Recommendations:  - Ok to shower tomorrow - Do not submerge for 7 days - Routine line care    Signed,  Ilyana Manuele K. Jaysiah Marchetta, MD   

## 2021-01-25 NOTE — Telephone Encounter (Signed)
Told Ms Seldon that the urine culture from 01-23-21 was contaminated. Dr. Denman George would like to refer her to [facility] to be evaluated for urinary frequency,urgency, and leakage. Pt would like to see Dr. Alexis Frock.

## 2021-01-25 NOTE — Telephone Encounter (Signed)
Told Ms Apgar that shehas an appointment with Dr. Tresa Moore on Tuesday 03-14-21 at 1045. Arrive at 1030.  The office will send her a new patient packet. Faxed office notes to Dr. Tresa Moore for 03-14-21 appointment.

## 2021-01-25 NOTE — H&P (Signed)
Chief Complaint: Patient was seen in consultation today for port placement at the request of [place]  Referring Physician(s): [place]  Supervising Physician: Jacqulynn Cadet  Patient Status: [facility] - Out-pt  History of Present Illness: Stacie Russell is a 70 y.o. female with endometrial cancer. She is to begin chemotherapy soon and is referred for port placement. PMHx, meds, labs, imaging, allergies reviewed. Feels well, no recent fevers, chills, illness. Has been NPO today as directed.   Past Medical History:  Diagnosis Date   Arthritis    Asthma    Diverticulosis    Dyspnea    with exertion    Endometrial hyperplasia    Esophageal stricture    Foot pain    Fundic gland polyps of stomach, benign 07/10/2015   Gastritis    GERD (gastroesophageal reflux disease)    Hemorrhoid    Hiatal hernia    Hip pain    History of kidney stones    cystoscopy   History of surgery on arm    left humerus   Hyperlipidemia    Hypertension    Hypothyroidism    Knee pain    Metabolic syndrome    Obstructive sleep apnea 08/27/2017   pt denies    Oxygen deficiency    pt denies   PONV (postoperative nausea and vomiting)    Spinal headache    few headaches recently    Tendonitis of ankle    Vitamin D deficiency     Past Surgical History:  Procedure Laterality Date   ANAL FISSURE REPAIR     CATARACT EXTRACTION  May 2009   COLONOSCOPY  12/26/07   ESOPHAGEAL DILATION     ESOPHAGOGASTRODUODENOSCOPY  12/26/07, 05/13/08   EYE SURGERY Bilateral    bilateral cataract with lens implants   KNEE CARTILAGE SURGERY Right Sept 1999   ORIF HUMERUS FRACTURE Left 01/12/2020   Procedure: OPEN REDUCTION INTERNAL FIXATION (ORIF) LEFT HUMERUS FRACTURE;  Surgeon: Roseanne Kaufman, MD;  Location: [facility];  Service: Orthopedics;  Laterality: Left;   ROBOTIC ASSISTED TOTAL HYSTERECTOMY WITH BILATERAL SALPINGO OOPHERECTOMY Bilateral 12/29/2020   Procedure: XI ROBOTIC ASSISTED TOTAL HYSTERECTOMY  WITH BILATERAL SALPINGO OOPHORECTOMY;  Surgeon: Everitt Amber, MD;  Location: [facility];  Service: Gynecology;  Laterality: Bilateral;   SENTINEL NODE BIOPSY N/A 12/29/2020   Procedure: SENTINEL NODE BIOPSY;  Surgeon: Everitt Amber, MD;  Location: [facility];  Service: Gynecology;  Laterality: N/A;   TOTAL KNEE ARTHROPLASTY Right 10/07/2017   Procedure: RIGHT TOTAL KNEE ARTHROPLASTY;  Surgeon: Gaynelle Arabian, MD;  Location: [facility];  Service: Orthopedics;  Laterality: Right;  Adductor Block   UMBILICAL HERNIA REPAIR  05/20/06    Allergies: Tramadol  Medications: Prior to Admission medications   Medication Sig Start Date End Date Taking? Authorizing Provider  acetaminophen (TYLENOL) 500 MG tablet Take 1,000 mg by mouth as needed (pain.).   Yes [provider]  albuterol (VENTOLIN HFA) 108 (90 Base) MCG/ACT inhaler Inhale 2 puffs into the lungs every 6 (six) hours as needed for wheezing or shortness of breath. Patient taking differently: Inhale 2 puffs into the lungs as needed for wheezing or shortness of breath. 09/06/20  Yes Gottschalk, Ashly M, DO  budesonide-formoterol (SYMBICORT) 80-4.5 MCG/ACT inhaler Inhale 2 puffs into the lungs 2 (two) times daily. Patient taking differently: Inhale 2 puffs into the lungs as needed (respiratory issues.). 09/01/20  Yes Gottschalk, Leatrice Jewels M, DO  Calcium Carbonate-Vit D-Min (CALCIUM 1200 PO) Take 1 tablet by mouth in the morning.  Yes [provider]  Cholecalciferol (VITAMIN D3) 2000 units TABS Take 2,000 Units by mouth in the morning.   Yes [provider]  docusate sodium (COLACE) 100 MG capsule Take 100 mg by mouth 2 (two) times daily as needed (constipation.).   Yes [provider]  DULoxetine (CYMBALTA) 30 MG capsule Take 1 capsule (30 mg total) by mouth daily. Patient taking differently: Take 30 mg by mouth in the morning. 08/31/20  Yes Ronnie Doss M, DO  esomeprazole (NEXIUM) 40 MG capsule Take 1 capsule (40 mg total) by  mouth daily before breakfast. 01/13/21  Yes Gatha Mayer, MD  levothyroxine (SYNTHROID) 50 MCG tablet TAKE 1 TABLET BY MOUTH  DAILY BEFORE BREAKFAST Patient taking differently: Take 50 mcg by mouth daily before breakfast. 03/17/20  Yes Gottschalk, Ashly M, DO  losartan-hydrochlorothiazide (HYZAAR) 100-12.5 MG tablet Take 1 tablet by mouth daily. Patient taking differently: Take 1 tablet by mouth in the morning. 08/31/20  Yes Gottschalk, Leatrice Jewels M, DO  Omega-3 Fatty Acids (FISH OIL) 1000 MG CAPS Take 1,000 mg by mouth in the morning.   Yes [provider]  oxybutynin (DITROPAN) 5 MG tablet Take 2 tablets (10 mg total) by mouth 2 (two) times daily. 08/31/20  Yes Gottschalk, Leatrice Jewels M, DO  vitamin B-12 (CYANOCOBALAMIN) 1000 MCG tablet Take 1,000 mcg by mouth in the morning.   Yes [provider]  dexamethasone (DECADRON) 4 MG tablet Take 2 tabs at the night before and 2 tab the morning of chemotherapy, every 3 weeks, by mouth x 6 cycles 01/23/21   Heath Lark, MD  furosemide (LASIX) 20 MG tablet Take 1 tablet (20 mg total) by mouth daily as needed for fluid. Patient taking differently: Take 20 mg by mouth as needed for fluid. 08/31/20   Janora Norlander, DO  lidocaine-prilocaine (EMLA) cream Apply to affected area once 01/23/21   Heath Lark, MD  ondansetron (ZOFRAN) 8 MG tablet Take 1 tablet (8 mg total) by mouth 2 (two) times daily as needed. Start on the third day after chemotherapy. 01/23/21   Heath Lark, MD  prochlorperazine (COMPAZINE) 10 MG tablet Take 1 tablet (10 mg total) by mouth every 6 (six) hours as needed (Nausea or vomiting). 01/23/21   Heath Lark, MD  valACYclovir (VALTREX) 1000 MG tablet Take 1 tablet twice daily for 7 days.  If you have another flare, take 2 tablets twice a day for ONE day only per flareup. Patient taking differently: Take 500 mg by mouth 2 (two) times daily as needed (fever blisters). 09/20/20   Janora Norlander, DO     Family History  Problem Relation  Age of Onset   Cancer Mother        Liver, stomach, colon   Colon cancer Mother    Heart attack Father    Heart disease Sister    Breast cancer Sister    Breast cancer Sister    Esophageal cancer Neg Hx    Stomach cancer Neg Hx    Rectal cancer Neg Hx     Social History   Socioeconomic History   Marital status: Married    Spouse name: Marcello Moores   Number of children: 2   Years of education: 14   Highest education level: Associate degree: academic program  Occupational History   Occupation: Retired  Tobacco Use   Smoking status: Never   Smokeless tobacco: Never  Vaping Use   Vaping Use: Never used  Substance and Sexual Activity   Alcohol  use: No   Drug use: No   Sexual activity: Not Currently    Birth control/protection: Post-menopausal  Other Topics Concern   Not on file  Social History Narrative   Married and retired   No EtOH, tobacco or drug use   Social Determinants of Radio broadcast assistant Strain: Low Risk    Difficulty of Paying Living Expenses: Not hard at all  Food Insecurity: No Food Insecurity   Worried About Charity fundraiser in the Last Year: Never true   Arboriculturist in the Last Year: Never true  Transportation Needs: No Transportation Needs   Lack of Transportation (Medical): No   Lack of Transportation (Non-Medical): No  Physical Activity: Not on file  Stress: Not on file  Social Connections: Socially Integrated   Frequency of Communication with Friends and Family: More than three times a week   Frequency of Social Gatherings with Friends and Family: More than three times a week   Attends Religious Services: More than 4 times per year   Active Member of [employer] or Organizations: Yes   Attends Music therapist: More than 4 times per year   Marital Status: Married     Review of Systems: A 12 point ROS discussed and pertinent positives are indicated in the HPI above.  All other systems are negative.  Review of  Systems  Vital Signs: BP (!) 156/99   Pulse 76   Temp 97.8 F (36.6 C) (Oral)   Resp 17   SpO2 98%    Physical Exam Constitutional:      General: She is not in acute distress.    Appearance: Normal appearance. She is not ill-appearing.  HENT:     Mouth/Throat:     Mouth: Mucous membranes are moist.     Pharynx: Oropharynx is clear.  Cardiovascular:     Rate and Rhythm: Normal rate and regular rhythm.     Heart sounds: Normal heart sounds.  Pulmonary:     Effort: Pulmonary effort is normal. No respiratory distress.     Breath sounds: Normal breath sounds.  Neurological:     General: No focal deficit present.     Mental Status: She is alert and oriented to person, place, and time.  Psychiatric:        Mood and Affect: Mood normal.        Thought Content: Thought content normal.        Judgment: Judgment normal.    Imaging: No results found.  Labs:  CBC: Recent Labs    03/01/20 1055 12/26/20 1341  WBC 6.4 7.9  HGB 12.9 13.8  HCT 38.2 42.5  PLT 250 227    COAGS: No results for input(s): INR, APTT in the last 8760 hours.  BMP: Recent Labs    03/01/20 1055 08/31/20 1149 12/19/20 1432  NA 141 141 142  K 4.4 4.7 3.9  CL 104 102 103  CO2 23 22 28   GLUCOSE 93 84 86  BUN 18 15 17   CALCIUM 9.5 10.1 9.7  CREATININE 0.66 0.76 0.77  GFRNONAA 90 79 >60  GFRAA 104 91  --     LIVER FUNCTION TESTS: Recent Labs    03/01/20 1055 08/31/20 1149 12/19/20 1432  BILITOT 0.4 0.5 0.5  AST 18 19 15   ALT 18 20 16   ALKPHOS 112 135* 92  PROT 6.8 7.3 7.4  ALBUMIN 4.2 4.7 4.0    TUMOR MARKERS: No results for input(s): AFPTM, CEA,  CA199, CHROMGRNA in the last 8760 hours.  Assessment and Plan: Endometrial cancer For port placement Risks and benefits of image guided port-a-catheter placement was discussed with the patient including, but not limited to bleeding, infection, pneumothorax, or fibrin sheath development and need for additional procedures.  All of the  patient's questions were answered, patient is agreeable to proceed. Consent signed and in chart.    Thank you for this interesting consult.  I greatly enjoyed meeting KIMBERLYN QUIOCHO and look forward to participating in their care.  A copy of this report was sent to the requesting provider on this date.  Electronically Signed: Ascencion Dike, PA-C 01/25/2021, 8:27 AM   I spent a total of 30 Minutes in face to face in clinical consultation, greater than 50% of which was counseling/coordinating care for port placement

## 2021-01-25 NOTE — Telephone Encounter (Signed)
A Randomized pragmatic Chair-Based Home Exercise Intervention for Mitigating Cancer-Related Fatigue in Older Adults Undergoing Chemotherapy for Advanced Disease  1:31pm:  Called patient concerning this research study.  Patient denied any questions about the study at this time and is interested in participating.    Patient was informed that registration and baseline visit for this study should occur between cycle 1 and 2 of chemotherapy.  Patient is unable to come in early on 02/07/21, the day of her first chemo infusion.  Scheduled research visit for 9:45am on 02/28/21 the morning of her second infusion.    Patient was advised to call with any questions or concerns.  [place] Coordinator I  01/25/21  1:40 PM

## 2021-01-26 ENCOUNTER — Inpatient Hospital Stay (HOSPITAL_BASED_OUTPATIENT_CLINIC_OR_DEPARTMENT_OTHER): Payer: Medicare Other | Admitting: Genetic Counselor

## 2021-01-26 ENCOUNTER — Encounter: Payer: Self-pay | Admitting: Genetic Counselor

## 2021-01-26 ENCOUNTER — Inpatient Hospital Stay: Payer: Medicare Other

## 2021-01-26 ENCOUNTER — Other Ambulatory Visit: Payer: Self-pay | Admitting: Genetic Counselor

## 2021-01-26 DIAGNOSIS — Z803 Family history of malignant neoplasm of breast: Secondary | ICD-10-CM | POA: Diagnosis not present

## 2021-01-26 DIAGNOSIS — Z800 Family history of malignant neoplasm of digestive organs: Secondary | ICD-10-CM | POA: Diagnosis not present

## 2021-01-26 DIAGNOSIS — C541 Malignant neoplasm of endometrium: Secondary | ICD-10-CM

## 2021-01-26 HISTORY — DX: Family history of malignant neoplasm of breast: Z80.3

## 2021-01-26 LAB — GENETIC SCREENING ORDER

## 2021-01-26 NOTE — Progress Notes (Signed)
REFERRING PROVIDER: Dorothyann Gibbs, NP [place],  [place] [address]  PRIMARY PROVIDER:  Janora Norlander, DO  PRIMARY REASON FOR VISIT:  1. Endometrial cancer ([employer])   2. Family history of colon cancer in mother - dx 83's   3. Family history of breast cancer     HISTORY OF PRESENT ILLNESS:   Stacie Russell, a 70 y.o. female, was seen for a [facility] cancer genetics consultation at the request of Stacie John, NP due to a personal and family history of cancer.  Stacie Russell presents to clinic today to discuss the possibility of a hereditary predisposition to cancer, to discuss genetic testing, and to further clarify her future cancer risks, as well as potential cancer risks for family members.   In May 2022, at the age of 74, Stacie Russell was diagnosed with endometrial cancer.  She had a total hysterectomy with bilateral salpingo-oophorectomy in May 2022 and is scheduled to begin chemotherapy.  Tumor testing showed normal mismatch repair IHC and MSI-stable.    CANCER HISTORY:  Oncology History Overview Note  MSI stable, endometrioid grade 2   Endometrial cancer ([place])  12/19/2020 Initial Diagnosis   Endometrial cancer ([facility])   12/26/2020 Imaging   1. Lower uterine segment/cervical mass without evidence of metastatic disease. 2. Hepatic steatosis. 3. Aortic atherosclerosis (ICD10-I70.0). Coronary artery calcification.   12/29/2020 Pathology Results   FINAL MICROSCOPIC DIAGNOSIS:   A. SENTINEL LYMPH NODE, RIGHT OBTURATOR, EXCISION:  -  No carcinoma identified in one lymph node (0/1)  -  See comment   B. SENTINEL LYMPH NODE, LEFT OBTURATOR, EXCISION:  -  Micrometastasis involving one lymph node (18m/1)  -  See comment   C. UTERUS, CERVIX, BILATERAL TUBES AND OVARIES:  Uterus:  -  Endometrioid carcinoma, FIGO grade 2  -  See oncology table and comment below   Cervix:  -  No carcinoma identified   Bilateral Ovaries:  -  No carcinoma identified   Bilateral  Fallopian tubes:  -  No carcinoma identified   ONCOLOGY TABLE:   UTERUS, CARCINOMA OR CARCINOSARCOMA: Resection   Procedure: Total hysterectomy and bilateral salpingo-oophorectomy  Histologic Type: Endometrioid carcinoma  Histologic Grade: FIGO grade 2  Myometrial Invasion:       Percentage of Myometrial Invasion: Estimated to be less than 50%  Uterine Serosa Involvement: Not identified  Cervical stromal Involvement: Not identified  Extent of involvement of other tissue/organs: Not identified  Peritoneal/Ascitic Fluid: Not submitted/unknown  Lymphovascular Invasion: Not identified  Regional Lymph Nodes:       Pelvic Lymph Nodes Examined:  2 Sentinel                                0 non-sentinel                                   2 total       Pelvic Lymph Nodes with Metastasis: 0                           Macrometastasis: (>2.0 mm): 0                          Micrometastasis: (>0.2 mm and < 2.0 mm): 1  Isolated Tumor Cells (<0.2 mm): 0                       Laterality of Lymph Node with Tumor: 0                             Extracapsular Extension: N/A  Distant Metastasis:       Distant Site(s) Involved: N/A  Pathologic Stage Classification (pTNM, AJCC 8th Edition): pT1a, pN4m  Ancillary Studies: MMR / MSI testing will be ordered  Representative Tumor Block: C1  Comment(s): Cytokeratin AE1/3 was performed on the lymph nodes (parts a and B) and supports the presence of micrometastasis (part B only). Drs. PSaralyn Pilarand CGulf [facility] Surgery Center LLCreviewed select parts of the case and agree with the above diagnosis.   (v4.2.0.1)     12/29/2020 Surgery   Surgeon: RDonaciano EvaPre-operative Diagnosis: endometrial cancer grade 3    Operation: Robotic-assisted laparoscopic total hysterectomy with bilateral salpingoophorectomy, SLN biopsy   Surgeon: RDonaciano Eva Specimens: uterus, cervix, bilateral tubes and ovaries, right and left obturator SLN            01/22/2021 Cancer Staging   Staging form: Corpus Uteri - Carcinoma and Carcinosarcoma, AJCC 8th Edition - Pathologic stage from 01/22/2021: Stage IIIC1 (pT1a, pN126m cM0) - Signed by GoHeath LarkMD on 01/22/2021  Stage prefix: Initial diagnosis    01/25/2021 Procedure   Successful placement of a right IJ approach Power Port with ultrasound and fluoroscopic guidance. The catheter is ready for use.   02/07/2021 -  Chemotherapy    Patient is on Treatment Plan: UTERINE CARBOPLATIN AUC 6 / PACLITAXEL Q21D         RISK FACTORS:  Menarche was at age 70 First live birth at age 70 OCP use for approximately 0 years.  Ovaries intact: no.  Hysterectomy: yes.  Menopausal status: postmenopausal.  HRT use: 0 years. Colonoscopy: yes;  most recent in 2016 . Mammogram within the last year: yes. Number of breast biopsies: 0. Any excessive radiation exposure in the past: no  Past Medical History:  Diagnosis Date   Arthritis    Asthma    Diverticulosis    Dyspnea    with exertion    Endometrial hyperplasia    Esophageal stricture    Family history of breast cancer 01/26/2021   Foot pain    Fundic gland polyps of stomach, benign 07/10/2015   Gastritis    GERD (gastroesophageal reflux disease)    Hemorrhoid    Hiatal hernia    Hip pain    History of kidney stones    cystoscopy   History of surgery on arm    left humerus   Hyperlipidemia    Hypertension    Hypothyroidism    Knee pain    Metabolic syndrome    Obstructive sleep apnea 08/27/2017   pt denies    Oxygen deficiency    pt denies   PONV (postoperative nausea and vomiting)    Spinal headache    few headaches recently    Tendonitis of ankle    Vitamin D deficiency     Past Surgical History:  Procedure Laterality Date   ANAL FISSURE REPAIR     CATARACT EXTRACTION  May 2009   COLONOSCOPY  12/26/07   ESOPHAGEAL DILATION     ESOPHAGOGASTRODUODENOSCOPY  12/26/07, 05/13/08   EYE SURGERY Bilateral    bilateral  cataract with  lens implants   IR IMAGING GUIDED PORT INSERTION  01/25/2021   KNEE CARTILAGE SURGERY Right Sept 1999   ORIF HUMERUS FRACTURE Left 01/12/2020   Procedure: OPEN REDUCTION INTERNAL FIXATION (ORIF) LEFT HUMERUS FRACTURE;  Surgeon: Roseanne Kaufman, MD;  Location: [facility];  Service: Orthopedics;  Laterality: Left;   ROBOTIC ASSISTED TOTAL HYSTERECTOMY WITH BILATERAL SALPINGO OOPHERECTOMY Bilateral 12/29/2020   Procedure: XI ROBOTIC ASSISTED TOTAL HYSTERECTOMY WITH BILATERAL SALPINGO OOPHORECTOMY;  Surgeon: Everitt Amber, MD;  Location: [facility];  Service: Gynecology;  Laterality: Bilateral;   SENTINEL NODE BIOPSY N/A 12/29/2020   Procedure: SENTINEL NODE BIOPSY;  Surgeon: Everitt Amber, MD;  Location: [facility];  Service: Gynecology;  Laterality: N/A;   TOTAL KNEE ARTHROPLASTY Right 10/07/2017   Procedure: RIGHT TOTAL KNEE ARTHROPLASTY;  Surgeon: Gaynelle Arabian, MD;  Location: [facility];  Service: Orthopedics;  Laterality: Right;  Adductor Block   UMBILICAL HERNIA REPAIR  05/20/06    Social History   Socioeconomic History   Marital status: Married    Spouse name: Stacie Russell   Number of children: 2   Years of education: 14   Highest education level: Associate degree: academic program  Occupational History   Occupation: Retired  Tobacco Use   Smoking status: Never   Smokeless tobacco: Never  Scientific laboratory technician Use: Never used  Substance and Sexual Activity   Alcohol use: No   Drug use: No   Sexual activity: Not Currently    Birth control/protection: Post-menopausal  Other Topics Concern   Not on file  Social History Narrative   Married and retired   No EtOH, tobacco or drug use   Social Determinants of Radio broadcast assistant Strain: Low Risk    Difficulty of Paying Living Expenses: Not hard at all  Food Insecurity: No Food Insecurity   Worried About Charity fundraiser in the Last Year: Never true   Arboriculturist in the Last Year: Never true  Transportation Needs: No  Transportation Needs   Lack of Transportation (Medical): No   Lack of Transportation (Non-Medical): No  Physical Activity: Not on file  Stress: Not on file  Social Connections: Socially Integrated   Frequency of Communication with Friends and Family: More than three times a week   Frequency of Social Gatherings with Friends and Family: More than three times a week   Attends Religious Services: More than 4 times per year   Active Member of [employer] or Organizations: Yes   Attends Music therapist: More than 4 times per year   Marital Status: Married     FAMILY HISTORY:  We obtained a detailed, 4-generation family history.  Significant diagnoses are listed below: Family History  Problem Relation Age of Onset   Colon cancer Mother 69   Heart attack Father    Heart disease Sister    Breast cancer Sister 81   Breast cancer Sister        dx 65s   Cancer Maternal Aunt 75       "female cancer", unknown type   Cancer Maternal Grandmother        unknown type; dx 80s   Cancer Maternal Grandfather        unknown type; dx 23s   Cancer Cousin        maternal female cousin; dx 77s; unknown type   Cancer Cousin        maternal female cousin; dx 1s; unknown type   Cancer Cousin  maternal female cousin; dx after 59; unknown type   Esophageal cancer Neg Hx    Stomach cancer Neg Hx    Rectal cancer Neg Hx     Stacie Russell is unaware of previous family history of genetic testing for hereditary cancer risks. There is no reported Ashkenazi Jewish ancestry. There is no known consanguinity.  GENETIC COUNSELING ASSESSMENT: Stacie Russell is a 70 y.o. female with a personal and family history of a hereditary cancer syndrome and predisposition to cancer given the presence of related cancers in the family. We, therefore, discussed and recommended the following at today's visit.   DISCUSSION: We discussed that approximately 5 - 10% of cancer is hereditary, with most cases of heterditary  enodmetrial cancer associated with mutations in the Lynch syndrome genes.  We reviewed her normal IHC and MSI results and disucssed the benefits and limitations of this screening. There are other genes that can be associated with hereditary colon cancer syndromes.  Type of cancer risk and level of risk are gene-specific.  We discussed that testing is beneficial for several reasons including knowing how to follow individuals after completing their treatment and understanding if other family members could be at risk for cancer and allowing them to undergo genetic testing.   We reviewed the characteristics, features and inheritance patterns of hereditary cancer syndromes. We also discussed genetic testing, including the appropriate family members to test, the process of testing, insurance coverage and turn-around-time for results. We discussed the implications of a negative, positive, carrier and/or variant of uncertain significant result. We recommended Stacie Russell pursue genetic testing for a panel that includes genes associated with colon, uterine, and breast cancer.   Stacie Russell  was offered a common hereditary cancer panel (47 genes) and an expanded pan-cancer panel (84 genes). Stacie Russell was informed of the benefits and limitations of each panel, including that expanded pan-cancer panels contain genes that do not have clear management guidelines at this point in time.  We also discussed that as the number of genes included on a panel increases, the chances of variants of uncertain significance increases.  After considering the benefits and limitations of each gene panel, Stacie Russell  elected to have a common hereditary cancer panel through [facility].   The Common Hereditary Cancers + RNA Panel offered by [facility] includes sequencing, deletion/duplication, and RNA testing of the following 47 genes: APC, ATM, AXIN2, BARD1, BMPR1A, BRCA1, BRCA2, BRIP1, CDH1, CDK4*, CDKN2A (p14ARF)*, CDKN2A (p16INK4a)*,  CHEK2, CTNNA1, DICER1, EPCAM (Deletion/duplication testing only), GREM1 (promoter region deletion/duplication testing only), KIT, MEN1, MLH1, MSH2, MSH3, MSH6, MUTYH, NBN, NF1, NHTL1, PALB2, PDGFRA*, PMS2, POLD1, POLE, PTEN, RAD50, RAD51C, RAD51D, SDHB, SDHC, SDHD, SMAD4, SMARCA4. STK11, TP53, TSC1, TSC2, and VHL.  The following genes were evaluated for sequence changes only: SDHA and HOXB13 c.251G>A variant only.  RNA analysis is not performed for the * genes.     Based on Stacie Russell's personal history of endometrial cancer and family history of colon cancer in her mother before the age of 52, she meets medical criteria for genetic testing. Despite that she meets criteria, she may still have an out of pocket cost. We discussed that if she has an out of pocket cost for testing, the laboratory will reach out to discuss self-pay options and patient pay assistance programs.   PLAN: After considering the risks, benefits, and limitations, Stacie Russell provided informed consent to pursue genetic testing and the blood sample was sent to [facility] for analysis of the Common  Hereditary Cancers +RNA Panel. Results should be available within approximately 3 weeks' time, at which point they will be disclosed by telephone to Ms. Lomeli, as will any additional recommendations warranted by these results. Stacie Russell will receive a summary of her genetic counseling visit and a copy of her results once available. This information will also be available in Epic.   Lastly, we encouraged Stacie Russell to remain in contact with cancer genetics annually so that we can continuously update the family history and inform her of any changes in cancer genetics and testing that may be of benefit for this family.   Stacie Russell questions were answered to her satisfaction today. Our contact information was provided should additional questions or concerns arise. Thank you for the referral and allowing [place] to share in the  care of your patient.   Laronn Devonshire M. Joette Catching, [place], [facility] Genetic Counselor [email or web address] (P) [phone]  The patient was seen for a total of 40 minutes in face-to-face genetic counseling. The patient was seen alone.  Drs. Magrinat, Lindi Adie and/or Burr Medico were available to discuss this case as needed.    _______________________________________________________________________ For Office Staff:  Number of people involved in session: 1 Was an Intern/ student involved with case: no

## 2021-01-27 ENCOUNTER — Encounter (HOSPITAL_COMMUNITY): Payer: Self-pay | Admitting: Radiology

## 2021-01-27 ENCOUNTER — Ambulatory Visit (HOSPITAL_COMMUNITY)
Admission: RE | Admit: 2021-01-27 | Discharge: 2021-01-27 | Disposition: A | Discharge status: Home / Self Care | Payer: Medicare Other | Source: Ambulatory Visit | Attending: Internal Medicine | Admitting: Internal Medicine

## 2021-01-27 DIAGNOSIS — R1319 Other dysphagia: Secondary | ICD-10-CM

## 2021-01-27 DIAGNOSIS — R1310 Dysphagia, unspecified: Secondary | ICD-10-CM | POA: Diagnosis not present

## 2021-01-27 DIAGNOSIS — K224 Dyskinesia of esophagus: Secondary | ICD-10-CM | POA: Diagnosis not present

## 2021-01-30 ENCOUNTER — Telehealth: Payer: Self-pay

## 2021-01-30 ENCOUNTER — Other Ambulatory Visit: Payer: Self-pay | Admitting: Hematology and Oncology

## 2021-01-30 NOTE — Telephone Encounter (Signed)
Called and scheduled for lab appt tomorrow at 1015. She is aware of appt time.

## 2021-01-31 ENCOUNTER — Inpatient Hospital Stay: Payer: Medicare Other

## 2021-01-31 ENCOUNTER — Other Ambulatory Visit: Payer: Self-pay | Admitting: Hematology and Oncology

## 2021-01-31 ENCOUNTER — Other Ambulatory Visit: Payer: Medicare Other

## 2021-01-31 ENCOUNTER — Other Ambulatory Visit: Payer: Self-pay

## 2021-01-31 DIAGNOSIS — Z79899 Other long term (current) drug therapy: Secondary | ICD-10-CM | POA: Diagnosis not present

## 2021-01-31 DIAGNOSIS — E039 Hypothyroidism, unspecified: Secondary | ICD-10-CM | POA: Diagnosis not present

## 2021-01-31 DIAGNOSIS — R350 Frequency of micturition: Secondary | ICD-10-CM | POA: Diagnosis not present

## 2021-01-31 DIAGNOSIS — R3915 Urgency of urination: Secondary | ICD-10-CM | POA: Diagnosis not present

## 2021-01-31 DIAGNOSIS — Z5111 Encounter for antineoplastic chemotherapy: Secondary | ICD-10-CM | POA: Diagnosis not present

## 2021-01-31 DIAGNOSIS — E785 Hyperlipidemia, unspecified: Secondary | ICD-10-CM | POA: Diagnosis not present

## 2021-01-31 DIAGNOSIS — I10 Essential (primary) hypertension: Secondary | ICD-10-CM | POA: Diagnosis not present

## 2021-01-31 DIAGNOSIS — C541 Malignant neoplasm of endometrium: Secondary | ICD-10-CM

## 2021-01-31 LAB — CMP (CANCER CENTER ONLY)
ALT: 18 U/L (ref 0–44)
AST: 18 U/L (ref 15–41)
Albumin: 4 g/dL (ref 3.5–5.0)
Alkaline Phosphatase: 74 U/L (ref 38–126)
Anion gap: 5 (ref 5–15)
BUN: 20 mg/dL (ref 8–23)
CO2: 28 mmol/L (ref 22–32)
Calcium: 9.5 mg/dL (ref 8.9–10.3)
Chloride: 106 mmol/L (ref 98–111)
Creatinine: 0.65 mg/dL (ref 0.44–1.00)
GFR, Estimated: >60 mL/min (ref >60)
Glucose, Bld: 81 mg/dL (ref 70–99)
Potassium: 4.2 mmol/L (ref 3.5–5.1)
Sodium: 139 mmol/L (ref 135–145)
Total Bilirubin: 0.6 mg/dL (ref 0.3–1.2)
Total Protein: 6.9 g/dL (ref 6.5–8.1)

## 2021-01-31 LAB — CBC WITH DIFFERENTIAL (CANCER CENTER ONLY)
Abs Immature Granulocytes: 0.01 10*3/uL (ref 0.00–0.07)
Basophils Absolute: 0 10*3/uL (ref 0.0–0.1)
Basophils Relative: 1 %
Eosinophils Absolute: 0.3 10*3/uL (ref 0.0–0.5)
Eosinophils Relative: 4 %
HCT: 41.1 % (ref 36.0–46.0)
Hemoglobin: 13.7 g/dL (ref 12.0–15.0)
Immature Granulocytes: 0 %
Lymphocytes Relative: 31 %
Lymphs Abs: 2 10*3/uL (ref 0.7–4.0)
MCH: 30.2 pg (ref 26.0–34.0)
MCHC: 33.3 g/dL (ref 30.0–36.0)
MCV: 90.7 fL (ref 80.0–100.0)
Monocytes Absolute: 0.6 10*3/uL (ref 0.1–1.0)
Monocytes Relative: 10 %
Neutro Abs: 3.5 10*3/uL (ref 1.7–7.7)
Neutrophils Relative %: 54 %
Platelet Count: 194 10*3/uL (ref 150–400)
RBC: 4.53 MIL/uL (ref 3.87–5.11)
RDW: 13.2 % (ref 11.5–15.5)
WBC Count: 6.4 10*3/uL (ref 4.0–10.5)
nRBC: 0 % (ref 0.0–0.2)

## 2021-01-31 NOTE — Progress Notes (Signed)
Pharmacist Chemotherapy Monitoring - Initial Assessment    Anticipated start date: 02/07/21   Regimen:  Are orders appropriate based on the patient's diagnosis, regimen, and cycle? Yes Does the plan date match the patient's scheduled date? Yes Is the sequencing of drugs appropriate? Yes Are the premedications appropriate for the patient's regimen? Yes Prior Authorization for treatment is: Not Started If applicable, is the correct biosimilar selected based on the patient's insurance? not applicable  Organ Function and Labs: Are dose adjustments needed based on the patient's renal function, hepatic function, or hematologic function? Yes Are appropriate labs ordered prior to the start of patient's treatment? Yes Other organ system assessment, if indicated: N/A The following baseline labs, if indicated, have been ordered: N/A  Dose Assessment: Are the drug doses appropriate? Yes Are the following correct: Drug concentrations Yes IV fluid compatible with drug Yes Administration routes Yes Timing of therapy Yes If applicable, does the patient have documented access for treatment and/or plans for port-a-cath placement? yes If applicable, have lifetime cumulative doses been properly documented and assessed? yes Lifetime Dose Tracking  No doses have been documented on this patient for the following tracked chemicals: Doxorubicin, Epirubicin, Idarubicin, Daunorubicin, Mitoxantrone, Bleomycin, Oxaliplatin, Carboplatin, Liposomal Doxorubicin    Toxicity Monitoring/Prevention: The patient has the following take home antiemetics prescribed: Ondansetron and Prochlorperazine The patient has the following take home medications prescribed: N/A Medication allergies and previous infusion related reactions, if applicable, have been reviewed and addressed. No The patient's current medication list has been assessed for drug-drug interactions with their chemotherapy regimen. no significant drug-drug  interactions were identified on review.  Order Review: Are the treatment plan orders signed? Yes Is the patient scheduled to see a provider prior to their treatment? No  I verify that I have reviewed each item in the above checklist and answered each question accordingly.  Larene Beach, [place], 01/31/2021  3:18 PM

## 2021-02-06 ENCOUNTER — Encounter: Payer: Self-pay | Admitting: Hematology and Oncology

## 2021-02-06 NOTE — Progress Notes (Signed)
Called pt to introduce myself as her Arboriculturist.  Unfortunately there aren't any foundations offering copay assistance for her Dx and the type of ins she has.  I informed her of the [employer], went over what it covers and gave her the income requirement.  Pt would like to apply so she will bring her proof of income on 02/07/21.  If approved I will give her an expense sheet and my card for any questions or concerns she may have in the future.

## 2021-02-07 ENCOUNTER — Encounter: Payer: Self-pay | Admitting: Hematology and Oncology

## 2021-02-07 ENCOUNTER — Inpatient Hospital Stay: Payer: Medicare Other

## 2021-02-07 ENCOUNTER — Other Ambulatory Visit: Payer: Self-pay

## 2021-02-07 VITALS — BP 147/85 | HR 80 | Temp 98.0°F | Resp 16 | Ht 60.0 in | Wt 227.1 lb

## 2021-02-07 DIAGNOSIS — Z79899 Other long term (current) drug therapy: Secondary | ICD-10-CM | POA: Diagnosis not present

## 2021-02-07 DIAGNOSIS — Z5111 Encounter for antineoplastic chemotherapy: Secondary | ICD-10-CM | POA: Diagnosis not present

## 2021-02-07 DIAGNOSIS — R350 Frequency of micturition: Secondary | ICD-10-CM | POA: Diagnosis not present

## 2021-02-07 DIAGNOSIS — E039 Hypothyroidism, unspecified: Secondary | ICD-10-CM | POA: Diagnosis not present

## 2021-02-07 DIAGNOSIS — R3915 Urgency of urination: Secondary | ICD-10-CM | POA: Diagnosis not present

## 2021-02-07 DIAGNOSIS — C541 Malignant neoplasm of endometrium: Secondary | ICD-10-CM

## 2021-02-07 DIAGNOSIS — E785 Hyperlipidemia, unspecified: Secondary | ICD-10-CM | POA: Diagnosis not present

## 2021-02-07 DIAGNOSIS — I10 Essential (primary) hypertension: Secondary | ICD-10-CM | POA: Diagnosis not present

## 2021-02-07 MED ORDER — FOSAPREPITANT DIMEGLUMINE INJECTION 150 MG
150.0000 mg | Freq: Once | INTRAVENOUS | Status: AC
  Administered 2021-02-07: 150 mg via INTRAVENOUS
  Filled 2021-02-07: qty 150

## 2021-02-07 MED ORDER — PROCHLORPERAZINE MALEATE 10 MG PO TABS
ORAL_TABLET | ORAL | Status: AC
  Filled 2021-02-07: qty 1

## 2021-02-07 MED ORDER — SODIUM CHLORIDE 0.9 % IV SOLN
Freq: Once | INTRAVENOUS | Status: AC
  Filled 2021-02-07: qty 250

## 2021-02-07 MED ORDER — PALONOSETRON HCL INJECTION 0.25 MG/5ML
INTRAVENOUS | Status: AC
  Filled 2021-02-07: qty 5

## 2021-02-07 MED ORDER — SODIUM CHLORIDE 0.9 % IV SOLN
10.0000 mg | Freq: Once | INTRAVENOUS | Status: AC
  Administered 2021-02-07: 10 mg via INTRAVENOUS
  Filled 2021-02-07: qty 10

## 2021-02-07 MED ORDER — FAMOTIDINE 20 MG IN NS 100 ML IVPB
20.0000 mg | Freq: Once | INTRAVENOUS | Status: AC
  Administered 2021-02-07: 20 mg via INTRAVENOUS

## 2021-02-07 MED ORDER — SODIUM CHLORIDE 0.9 % IV SOLN
140.0000 mg/m2 | Freq: Once | INTRAVENOUS | Status: AC
  Administered 2021-02-07: 306 mg via INTRAVENOUS
  Filled 2021-02-07: qty 51

## 2021-02-07 MED ORDER — DIPHENHYDRAMINE HCL 50 MG/ML IJ SOLN
INTRAMUSCULAR | Status: AC
  Filled 2021-02-07: qty 1

## 2021-02-07 MED ORDER — PALONOSETRON HCL INJECTION 0.25 MG/5ML
0.2500 mg | Freq: Once | INTRAVENOUS | Status: AC
  Administered 2021-02-07: 0.25 mg via INTRAVENOUS

## 2021-02-07 MED ORDER — FAMOTIDINE 20 MG IN NS 100 ML IVPB
INTRAVENOUS | Status: AC
  Filled 2021-02-07: qty 100

## 2021-02-07 MED ORDER — SODIUM CHLORIDE 0.9 % IV SOLN
650.0000 mg | Freq: Once | INTRAVENOUS | Status: AC
  Administered 2021-02-07: 650 mg via INTRAVENOUS
  Filled 2021-02-07: qty 65

## 2021-02-07 MED ORDER — DIPHENHYDRAMINE HCL 50 MG/ML IJ SOLN
25.0000 mg | Freq: Once | INTRAMUSCULAR | Status: AC
  Administered 2021-02-07: 25 mg via INTRAVENOUS

## 2021-02-07 NOTE — Progress Notes (Signed)
Pt is overqualified for the [employer].

## 2021-02-07 NOTE — Patient Instructions (Addendum)
[facility] ONCOL[facility]  Discharge Instructions: Thank you for choosing [facility] to provide your oncology and hematology care.   If you have a lab appointment with the [facility], please go directly to the [facility] and check in at the registration area.   Wear comfortable clothing and clothing appropriate for easy access to any Portacath or PICC line.   We strive to give you quality time with your provider. You may need to reschedule your appointment if you arrive late (15 or more minutes).  Arriving late affects you and other patients whose appointments are after yours.  Also, if you miss three or more appointments without notifying the office, you may be dismissed from the clinic at the provider's discretion.      For prescription refill requests, have your pharmacy contact our office and allow 72 hours for refills to be completed.    Today you received the following chemotherapy and/or immunotherapy agents taxol, carboplatin    To help prevent nausea and vomiting after your treatment, we encourage you to take your nausea medication as directed.  BELOW ARE SYMPTOMS THAT SHOULD BE REPORTED IMMEDIATELY: *FEVER GREATER THAN 100.4 F (38 C) OR HIGHER *CHILLS OR SWEATING *NAUSEA AND VOMITING THAT IS NOT CONTROLLED WITH YOUR NAUSEA MEDICATION *UNUSUAL SHORTNESS OF BREATH *UNUSUAL BRUISING OR BLEEDING *URINARY PROBLEMS (pain or burning when urinating, or frequent urination) *BOWEL PROBLEMS (unusual diarrhea, constipation, pain near the anus) TENDERNESS IN MOUTH AND THROAT WITH OR WITHOUT PRESENCE OF ULCERS (sore throat, sores in mouth, or a toothache) UNUSUAL RASH, SWELLING OR PAIN  UNUSUAL VAGINAL DISCHARGE OR ITCHING   Items with * indicate a potential emergency and should be followed up as soon as possible or go to the Emergency Department if any problems should occur.  Please show the CHEMOTHERAPY ALERT CARD or IMMUNOTHERAPY ALERT CARD at  check-in to the Emergency Department and triage nurse.  Should you have questions after your visit or need to cancel or reschedule your appointment, please contact [facility]  Dept: [phone]  and follow the prompts.  Office hours are 8:00 a.m. to 4:30 p.m. Monday - Friday. Please note that voicemails left after 4:00 p.m. may not be returned until the following business day.  We are closed weekends and major holidays. You have access to a nurse at all times for urgent questions. Please call the main number to the clinic Dept: [phone] and follow the prompts.   For any non-urgent questions, you may also contact your provider using MyChart. We now offer e-Visits for anyone 38 and older to request care online for non-urgent symptoms. For details visit [email or web address].   Also download the MyChart app! Go to the app store, search "MyChart", open the app, select [facility], and log in with your MyChart username and password.  Due to Covid, a mask is required upon entering the hospital/clinic. If you do not have a mask, one will be given to you upon arrival. For doctor visits, patients may have 1 support person aged 60 or older with them. For treatment visits, patients cannot have anyone with them due to current Covid guidelines and our immunocompromised population.

## 2021-02-09 ENCOUNTER — Other Ambulatory Visit (HOSPITAL_COMMUNITY): Payer: Medicare Other

## 2021-02-10 ENCOUNTER — Telehealth: Payer: Self-pay

## 2021-02-10 NOTE — Telephone Encounter (Signed)
Returned her call. She is complaining of constipation. No bm since Monday. She is taking Miralax only daily. She normally has a bm every 2 to 3 days. Instructed to take Miralax BID and Senokot 2 tabs TID. She verbalized understanding. She is eating kay and drinking lots of fluids. Instructed to call the office back on Tuesday with a update.

## 2021-02-14 ENCOUNTER — Telehealth: Payer: Self-pay | Admitting: Genetic Counselor

## 2021-02-14 ENCOUNTER — Other Ambulatory Visit: Payer: Self-pay | Admitting: Hematology and Oncology

## 2021-02-14 ENCOUNTER — Encounter: Payer: Self-pay | Admitting: Genetic Counselor

## 2021-02-14 ENCOUNTER — Ambulatory Visit: Payer: Self-pay | Admitting: Genetic Counselor

## 2021-02-14 DIAGNOSIS — C541 Malignant neoplasm of endometrium: Secondary | ICD-10-CM

## 2021-02-14 DIAGNOSIS — Z1379 Encounter for other screening for genetic and chromosomal anomalies: Secondary | ICD-10-CM

## 2021-02-14 DIAGNOSIS — K5909 Other constipation: Secondary | ICD-10-CM | POA: Insufficient documentation

## 2021-02-14 DIAGNOSIS — Z803 Family history of malignant neoplasm of breast: Secondary | ICD-10-CM

## 2021-02-14 DIAGNOSIS — Z800 Family history of malignant neoplasm of digestive organs: Secondary | ICD-10-CM

## 2021-02-14 MED ORDER — LACTULOSE 10 GM/15ML PO SOLN: 10.0000 g | Freq: Three times a day (TID) | ORAL | 0 refills | Status: DC

## 2021-02-14 NOTE — Telephone Encounter (Signed)
Called and left a message asking her to call the office back. 

## 2021-02-14 NOTE — Telephone Encounter (Signed)
Called and given below message. Ask her to call the office back for concerns/questions. She verbalized understanding.

## 2021-02-14 NOTE — Telephone Encounter (Signed)
Revealed negative genetic testing.  Discussed that we do not know why she has endometrial cancer or why there is cancer in the family. It could be sporadic/familial, due to a different gene that we are not testing, due to a change in a gene she did not inherit, or maybe our current technology may not be able to pick something up.  It will be important for her to keep in contact with genetics to keep up with whether additional testing may be needed.

## 2021-02-14 NOTE — Telephone Encounter (Signed)
Pls call and check on her

## 2021-02-14 NOTE — Telephone Encounter (Signed)
Called to check on her. She still has not had a bm since last Monday. Taking Miralax BID and Dulcolax 2 BID. She said sometimes she does not have a bm for 1 week. Able to eat and drink. Does not feel constipated. She uses Product/process development scientist.  She had severe joint/ bone pains. She took Tylenol for the pain and eventually the pain started getting better late Saturday.

## 2021-02-14 NOTE — Progress Notes (Signed)
HPI:  Stacie Russell was previously seen in the [facility] due to a personal and family history of cancer and concerns regarding a hereditary predisposition to cancer. Please refer to our prior cancer genetics clinic note for more information regarding our discussion, assessment and recommendations, at the time. Stacie Russell recent genetic test results were disclosed to her, as were recommendations warranted by these results. These results and recommendations are discussed in more detail below.  CANCER HISTORY:  Oncology History Overview Note  MSI stable, endometrioid grade 2   Endometrial cancer ([place])  12/19/2020 Initial Diagnosis   Endometrial cancer ([place])   12/26/2020 Imaging   1. Lower uterine segment/cervical mass without evidence of metastatic disease. 2. Hepatic steatosis. 3. Aortic atherosclerosis (ICD10-I70.0). Coronary artery calcification.   12/29/2020 Pathology Results   FINAL MICROSCOPIC DIAGNOSIS:   A. SENTINEL LYMPH NODE, RIGHT OBTURATOR, EXCISION:  -  No carcinoma identified in one lymph node (0/1)  -  See comment   B. SENTINEL LYMPH NODE, LEFT OBTURATOR, EXCISION:  -  Micrometastasis involving one lymph node (87m/1)  -  See comment   C. UTERUS, CERVIX, BILATERAL TUBES AND OVARIES:  Uterus:  -  Endometrioid carcinoma, FIGO grade 2  -  See oncology table and comment below   Cervix:  -  No carcinoma identified   Bilateral Ovaries:  -  No carcinoma identified   Bilateral Fallopian tubes:  -  No carcinoma identified   ONCOLOGY TABLE:   UTERUS, CARCINOMA OR CARCINOSARCOMA: Resection   Procedure: Total hysterectomy and bilateral salpingo-oophorectomy  Histologic Type: Endometrioid carcinoma  Histologic Grade: FIGO grade 2  Myometrial Invasion:       Percentage of Myometrial Invasion: Estimated to be less than 50%  Uterine Serosa Involvement: Not identified  Cervical stromal Involvement: Not identified  Extent of involvement of other  tissue/organs: Not identified  Peritoneal/Ascitic Fluid: Not submitted/unknown  Lymphovascular Invasion: Not identified  Regional Lymph Nodes:       Pelvic Lymph Nodes Examined:  2 Sentinel                                0 non-sentinel                                   2 total       Pelvic Lymph Nodes with Metastasis: 0                           Macrometastasis: (>2.0 mm): 0                          Micrometastasis: (>0.2 mm and < 2.0 mm): 1                            Isolated Tumor Cells (<0.2 mm): 0                       Laterality of Lymph Node with Tumor: 0                             Extracapsular Extension: N/A  Distant Metastasis:       Distant Site(s) Involved: N/A  Pathologic Stage  Classification (pTNM, AJCC 8th Edition): pT1a, pN102m  Ancillary Studies: MMR / MSI testing will be ordered  Representative Tumor Block: C1  Comment(s): Cytokeratin AE1/3 was performed on the lymph nodes (parts a and B) and supports the presence of micrometastasis (part B only). Drs. PSaralyn Pilarand CBoca [facility]aton [facility] Hospitalreviewed select parts of the case and agree with the above diagnosis.   (v4.2.0.1)     12/29/2020 Surgery   Surgeon: RDonaciano EvaPre-operative Diagnosis: endometrial cancer grade 3    Operation: Robotic-assisted laparoscopic total hysterectomy with bilateral salpingoophorectomy, SLN biopsy   Surgeon: RDonaciano Eva Specimens: uterus, cervix, bilateral tubes and ovaries, right and left obturator SLN           01/22/2021 Cancer Staging   Staging form: Corpus Uteri - Carcinoma and Carcinosarcoma, AJCC 8th Edition - Pathologic stage from 01/22/2021: Stage IIIC1 (pT1a, pN123m cM0) - Signed by GoHeath LarkMD on 01/22/2021  Stage prefix: Initial diagnosis    01/25/2021 Procedure   Successful placement of a right IJ approach Power Port with ultrasound and fluoroscopic guidance. The catheter is ready for use.   02/07/2021 -  Chemotherapy    Patient is on Treatment Plan: UTERINE  CARBOPLATIN AUC 6 / PACLITAXEL Q21D       02/11/2021 Genetic Testing   Negative hereditary cancer genetic testing: no pathogenic variants detected in [facility] Common Hereditary Cancers +RNA Panel.  The report date is February 11, 2021.    The Common Hereditary Cancers + RNA Panel offered by [facility] includes sequencing, deletion/duplication, and RNA testing of the following 47 genes: APC, ATM, AXIN2, BARD1, BMPR1A, BRCA1, BRCA2, BRIP1, CDH1, CDK4*, CDKN2A (p14ARF)*, CDKN2A (p16INK4a)*, CHEK2, CTNNA1, DICER1, EPCAM (Deletion/duplication testing only), GREM1 (promoter region deletion/duplication testing only), KIT, MEN1, MLH1, MSH2, MSH3, MSH6, MUTYH, NBN, NF1, NHTL1, PALB2, PDGFRA*, PMS2, POLD1, POLE, PTEN, RAD50, RAD51C, RAD51D, SDHB, SDHC, SDHD, SMAD4, SMARCA4. STK11, TP53, TSC1, TSC2, and VHL.  The following genes were evaluated for sequence changes only: SDHA and HOXB13 c.251G>A variant only.  RNA analysis is not performed for the * genes.       FAMILY HISTORY:  We obtained a detailed, 4-generation family history.  Significant diagnoses are listed below: Family History  Problem Relation Age of Onset   Colon cancer Mother 4737 Breast cancer Sister 5239 Breast cancer Sister        dx 6046s Cancer Maternal Aunt 7549     "female cancer", unknown type   Cancer Maternal Grandmother        unknown type; dx 8047s Cancer Maternal Grandfather        unknown type; dx 8029s Cancer Cousin        maternal female cousin; dx 3021sunknown type   Cancer Cousin        maternal female cousin; dx 5034sunknown type   Cancer Cousin        maternal female cousin; dx after 5011unknown type   Esophageal cancer Neg Hx    Stomach cancer Neg Hx    Rectal cancer Neg Hx     Stacie Russell unaware of previous family history of genetic testing for hereditary cancer risks. There is no reported Ashkenazi Jewish ancestry. There is no known consanguinity.    GENETIC TEST RESULTS: Genetic testing reported out on February 11, 2021.  The Common Hereditary Cancers + RNAinsight Panel detected no pathogenic mutations.  The Common Hereditary Cancers + RNA Panel offered  by [facility] includes sequencing, deletion/duplication, and RNA testing of the following 47 genes: APC, ATM, AXIN2, BARD1, BMPR1A, BRCA1, BRCA2, BRIP1, CDH1, CDK4*, CDKN2A (p14ARF)*, CDKN2A (p16INK4a)*, CHEK2, CTNNA1, DICER1, EPCAM (Deletion/duplication testing only), GREM1 (promoter region deletion/duplication testing only), KIT, MEN1, MLH1, MSH2, MSH3, MSH6, MUTYH, NBN, NF1, NHTL1, PALB2, PDGFRA*, PMS2, POLD1, POLE, PTEN, RAD50, RAD51C, RAD51D, SDHB, SDHC, SDHD, SMAD4, SMARCA4. STK11, TP53, TSC1, TSC2, and VHL.  The following genes were evaluated for sequence changes only: SDHA and HOXB13 c.251G>A variant only.  RNA analysis is not performed for the * genes.    The test report has been scanned into EPIC and is located under the Molecular Pathology section of the Results Review tab.  A portion of the result report is included below for reference.     We discussed with Stacie Russell that because current genetic testing is not perfect, it is possible there may be a gene mutation in one of these genes that current testing cannot detect, but that chance is small.  We also discussed, that there could be another gene that has not yet been discovered, or that we have not yet tested, that is responsible for the cancer diagnoses in the family. It is also possible there is a hereditary cause for the cancer in the family that Stacie Russell did not inherit and therefore was not identified in her testing.  Therefore, it is important to remain in touch with cancer genetics in the future so that we can continue to offer Stacie Russell the most up to date genetic testing.    ADDITIONAL GENETIC TESTING: There are other genes that are associated with increased cancer risk that can be analyzed. Should Stacie Russell wish to pursue additional genetic testing, we are happy to discuss and  coordinate this testing, at any time.    CANCER SCREENING RECOMMENDATIONS: Stacie Russell test result is considered negative (normal).  This means that we have not identified a hereditary cause for her personal history of cancer at this time. Most cancers happen by chance and this negative test suggests that her cancer may fall into this category.    While reassuring, this does not definitively rule out a hereditary predisposition to cancer. It is still possible that there could be genetic mutations that are undetectable by current technology. There could be genetic mutations in genes that have not been tested or identified to increase cancer risk.  Therefore, it is recommended she continue to follow the cancer management and screening guidelines provided by her oncology and primary healthcare provider.   An individual's cancer risk and medical management are not determined by genetic test results alone. Overall cancer risk assessment incorporates additional factors, including personal medical history, family history, and any available genetic information that may result in a personalized plan for cancer prevention and surveillance  RECOMMENDATIONS FOR FAMILY MEMBERS:  Individuals in this family might be at some increased risk of developing cancer, over the general population risk, simply due to the family history of cancer.  We recommended women in this family have a yearly mammogram beginning at age 1, or 27 years younger than the earliest onset of cancer, an annual clinical breast exam, and perform monthly breast self-exams. Women in this family should also have a gynecological exam as recommended by their primary provider.  First degree relatives of those with colon cancer should receive colonoscopies beginning at age 86, or 10 years prior to the earliest diagnosis of colon cancer in the family, and receive colonoscopies at least every  5 years, or as recommended by their gastroenterologist.     It is  also possible there is a hereditary cause for the cancer in Stacie Russell's family that she did not inherit and therefore was not identified in her.  Based on Stacie Russell's family history of colon cancer in her mother before age 15, we recommended her siblings have genetic counseling and testing. Stacie Russell will let us know if we can be of any assistance in coordinating genetic counseling and/or testing for this family member.   FOLLOW-UP: Lastly, we discussed with Stacie Russell that cancer genetics is a rapidly advancing field and it is possible that new genetic tests will be appropriate for her and/or her family members in the future. We encouraged her to remain in contact with cancer genetics on an annual basis so we can update her personal and family histories and let her know of advances in cancer genetics that may benefit this family.   Our contact number was provided. Stacie Russell questions were answered to her satisfaction, and she knows she is welcome to call us at anytime with additional questions or concerns.     Navah Grondin M. Joette Catching, [place], [facility] Genetic Counselor [email or web address] (P) [phone]

## 2021-02-14 NOTE — Telephone Encounter (Signed)
I sent prescription lactulose to take in addition to her current regimen for constipation

## 2021-02-16 ENCOUNTER — Telehealth: Payer: Self-pay

## 2021-02-16 ENCOUNTER — Other Ambulatory Visit: Payer: Self-pay | Admitting: Family Medicine

## 2021-02-16 NOTE — Telephone Encounter (Signed)
-----   Message from Heath Lark, MD sent at 02/16/2021  8:45 AM EDT ----- Can you call and check on her?

## 2021-02-16 NOTE — Telephone Encounter (Signed)
Returned her call. For the last 3 days she has started having red spots/ some are itchy to arms, chest and legs. She does not think it is bug bites. She is going to take a picture and try to send on mychart.  Instructed to call the office back in the am with update. She will take  benadryl for the itching and apply cortisone cream to the red areas. She verbalized understanding.

## 2021-02-16 NOTE — Telephone Encounter (Signed)
Called to check on her. She has had multiple bm's starting yesterday morning. She is complaining of weakness all over. She is using a cane and walking around her house. Yesterday she was having some nausea and would feel like she was going to pass out. She took Zofran and feels better today. Denies vomiting. She is eating multiple small meals and was able to eat breakfast today. She is drinking fluids with no problems. Instructed to call the office back if needed. She verbalized understanding.

## 2021-02-17 ENCOUNTER — Encounter: Payer: Self-pay | Admitting: Hematology and Oncology

## 2021-02-17 NOTE — Telephone Encounter (Signed)
Patient must be seen for refills

## 2021-02-21 ENCOUNTER — Other Ambulatory Visit: Payer: Self-pay | Admitting: Hematology and Oncology

## 2021-02-21 ENCOUNTER — Telehealth: Payer: Self-pay | Admitting: Oncology

## 2021-02-21 DIAGNOSIS — N3000 Acute cystitis without hematuria: Secondary | ICD-10-CM

## 2021-02-21 MED ORDER — AMOXICILLIN 500 MG PO TABS
500.0000 mg | ORAL_TABLET | Freq: Two times a day (BID) | ORAL | 0 refills | Status: DC
Start: 2021-02-21 — End: 2021-03-21

## 2021-02-21 NOTE — Telephone Encounter (Signed)
Advised Stacie Russell that Dr. Alvy Bimler has sent in a prescription for amoxicillin to the [facility] for her UTI symptoms (burning with urination).  Discussed that we will recheck a UA/Culture at her next visit on 02/28/2021 and will call to check on her later this week.  She verbalized understanding.

## 2021-02-23 ENCOUNTER — Telehealth: Payer: Self-pay | Admitting: Oncology

## 2021-02-23 NOTE — Telephone Encounter (Signed)
Called Stacie Russell and she is feeling better from the UTI - she is not having any burning with urination.  She does report feeling weak yesterday and today.

## 2021-02-23 NOTE — Telephone Encounter (Signed)
The weakness is probably related to infection Hopefully she will feel better soon

## 2021-02-23 NOTE — Telephone Encounter (Signed)
Called Pam back with message from Dr. Alvy Bimler.  She verbalized agreement and knows to call back with any questions/needs.

## 2021-02-24 ENCOUNTER — Telehealth: Payer: Self-pay | Admitting: Emergency Medicine

## 2021-02-24 NOTE — Telephone Encounter (Signed)
A Randomized pragmatic Chair-Based Home Exercise Intervention for Mitigating Cancer-Related Fatigue in Older Adults Undergoing Chemotherapy for Advanced Disease  10:11am:  Called to confirm appointment at 9:45am on Tuesday 02/28/21.  Patient states she is still interested in participating in this study.  Patient reports fatigue of 9 on scale of 0-10 in the past week.  She also reports not currently having a regular exercise routine in the past 6 months with an average of less than 2 days of exercise per week, and an average of less than 60 minutes of physical activity per week.  [name] Clinical Research Coordinator I  02/24/21  10:18 AM

## 2021-02-28 ENCOUNTER — Inpatient Hospital Stay: Payer: Medicare Other | Attending: Gynecologic Oncology

## 2021-02-28 ENCOUNTER — Other Ambulatory Visit: Payer: Self-pay | Admitting: Hematology and Oncology

## 2021-02-28 ENCOUNTER — Encounter: Payer: Self-pay | Admitting: Hematology and Oncology

## 2021-02-28 ENCOUNTER — Inpatient Hospital Stay: Payer: Medicare Other | Admitting: Emergency Medicine

## 2021-02-28 ENCOUNTER — Ambulatory Visit: Payer: Medicare Other | Admitting: Family Medicine

## 2021-02-28 ENCOUNTER — Inpatient Hospital Stay: Payer: Medicare Other

## 2021-02-28 ENCOUNTER — Inpatient Hospital Stay: Payer: Medicare Other | Admitting: Hematology and Oncology

## 2021-02-28 ENCOUNTER — Other Ambulatory Visit: Payer: Self-pay

## 2021-02-28 DIAGNOSIS — R21 Rash and other nonspecific skin eruption: Secondary | ICD-10-CM | POA: Insufficient documentation

## 2021-02-28 DIAGNOSIS — N3000 Acute cystitis without hematuria: Secondary | ICD-10-CM

## 2021-02-28 DIAGNOSIS — R634 Abnormal weight loss: Secondary | ICD-10-CM | POA: Diagnosis not present

## 2021-02-28 DIAGNOSIS — Z885 Allergy status to narcotic agent status: Secondary | ICD-10-CM | POA: Insufficient documentation

## 2021-02-28 DIAGNOSIS — Z888 Allergy status to other drugs, medicaments and biological substances status: Secondary | ICD-10-CM | POA: Diagnosis not present

## 2021-02-28 DIAGNOSIS — C541 Malignant neoplasm of endometrium: Secondary | ICD-10-CM

## 2021-02-28 DIAGNOSIS — R197 Diarrhea, unspecified: Secondary | ICD-10-CM | POA: Insufficient documentation

## 2021-02-28 DIAGNOSIS — M2550 Pain in unspecified joint: Secondary | ICD-10-CM | POA: Insufficient documentation

## 2021-02-28 DIAGNOSIS — N390 Urinary tract infection, site not specified: Secondary | ICD-10-CM | POA: Insufficient documentation

## 2021-02-28 DIAGNOSIS — I700 Atherosclerosis of aorta: Secondary | ICD-10-CM | POA: Insufficient documentation

## 2021-02-28 DIAGNOSIS — K5909 Other constipation: Secondary | ICD-10-CM | POA: Insufficient documentation

## 2021-02-28 DIAGNOSIS — Z5111 Encounter for antineoplastic chemotherapy: Secondary | ICD-10-CM | POA: Diagnosis not present

## 2021-02-28 DIAGNOSIS — K760 Fatty (change of) liver, not elsewhere classified: Secondary | ICD-10-CM | POA: Diagnosis not present

## 2021-02-28 DIAGNOSIS — Z79899 Other long term (current) drug therapy: Secondary | ICD-10-CM | POA: Diagnosis not present

## 2021-02-28 DIAGNOSIS — I2510 Atherosclerotic heart disease of native coronary artery without angina pectoris: Secondary | ICD-10-CM | POA: Insufficient documentation

## 2021-02-28 LAB — CBC WITH DIFFERENTIAL (CANCER CENTER ONLY)
Abs Immature Granulocytes: 0.04 10*3/uL (ref 0.00–0.07)
Basophils Absolute: 0 10*3/uL (ref 0.0–0.1)
Basophils Relative: 0 %
Eosinophils Absolute: 0 10*3/uL (ref 0.0–0.5)
Eosinophils Relative: 0 %
HCT: 40.7 % (ref 36.0–46.0)
Hemoglobin: 13.8 g/dL (ref 12.0–15.0)
Immature Granulocytes: 1 %
Lymphocytes Relative: 10 %
Lymphs Abs: 0.8 10*3/uL (ref 0.7–4.0)
MCH: 30.6 pg (ref 26.0–34.0)
MCHC: 33.9 g/dL (ref 30.0–36.0)
MCV: 90.2 fL (ref 80.0–100.0)
Monocytes Absolute: 0 10*3/uL — ABNORMAL LOW (ref 0.1–1.0)
Monocytes Relative: 1 %
Neutro Abs: 7.1 10*3/uL (ref 1.7–7.7)
Neutrophils Relative %: 88 %
Platelet Count: 230 10*3/uL (ref 150–400)
RBC: 4.51 MIL/uL (ref 3.87–5.11)
RDW: 13.2 % (ref 11.5–15.5)
Smear Review: NORMAL
WBC Count: 8 10*3/uL (ref 4.0–10.5)
nRBC: 0 % (ref 0.0–0.2)

## 2021-02-28 LAB — CMP (CANCER CENTER ONLY)
ALT: 16 U/L (ref 0–44)
AST: 14 U/L — ABNORMAL LOW (ref 15–41)
Albumin: 3.8 g/dL (ref 3.5–5.0)
Alkaline Phosphatase: 97 U/L (ref 38–126)
Anion gap: 8 (ref 5–15)
BUN: 14 mg/dL (ref 8–23)
CO2: 26 mmol/L (ref 22–32)
Calcium: 10.1 mg/dL (ref 8.9–10.3)
Chloride: 104 mmol/L (ref 98–111)
Creatinine: 0.74 mg/dL (ref 0.44–1.00)
GFR, Estimated: >60 mL/min (ref >60)
Glucose, Bld: 130 mg/dL — ABNORMAL HIGH (ref 70–99)
Potassium: 4.2 mmol/L (ref 3.5–5.1)
Sodium: 138 mmol/L (ref 135–145)
Total Bilirubin: 0.4 mg/dL (ref 0.3–1.2)
Total Protein: 7.8 g/dL (ref 6.5–8.1)

## 2021-02-28 MED ORDER — DIPHENHYDRAMINE HCL 50 MG/ML IJ SOLN
25.0000 mg | Freq: Once | INTRAMUSCULAR | Status: AC
  Administered 2021-02-28: 25 mg via INTRAVENOUS

## 2021-02-28 MED ORDER — DIPHENHYDRAMINE HCL 50 MG/ML IJ SOLN
INTRAMUSCULAR | Status: AC
  Filled 2021-02-28: qty 1

## 2021-02-28 MED ORDER — FAMOTIDINE 20 MG IN NS 100 ML IVPB
20.0000 mg | Freq: Once | INTRAVENOUS | Status: AC
  Administered 2021-02-28: 20 mg via INTRAVENOUS

## 2021-02-28 MED ORDER — PALONOSETRON HCL INJECTION 0.25 MG/5ML
INTRAVENOUS | Status: AC
  Filled 2021-02-28: qty 5

## 2021-02-28 MED ORDER — HEPARIN SOD (PORK) LOCK FLUSH 100 UNIT/ML IV SOLN
500.0000 [IU] | Freq: Once | INTRAVENOUS | Status: AC | PRN
  Administered 2021-02-28: 500 [IU]
  Filled 2021-02-28: qty 5

## 2021-02-28 MED ORDER — SODIUM CHLORIDE 0.9 % IV SOLN
150.0000 mg | Freq: Once | INTRAVENOUS | Status: AC
  Administered 2021-02-28: 150 mg via INTRAVENOUS
  Filled 2021-02-28: qty 150

## 2021-02-28 MED ORDER — PALONOSETRON HCL INJECTION 0.25 MG/5ML
0.2500 mg | Freq: Once | INTRAVENOUS | Status: AC
  Administered 2021-02-28: 0.25 mg via INTRAVENOUS

## 2021-02-28 MED ORDER — SODIUM CHLORIDE 0.9 % IV SOLN
642.6000 mg | Freq: Once | INTRAVENOUS | Status: AC
  Administered 2021-02-28: 640 mg via INTRAVENOUS
  Filled 2021-02-28: qty 64

## 2021-02-28 MED ORDER — FAMOTIDINE 20 MG IN NS 100 ML IVPB
INTRAVENOUS | Status: AC
  Filled 2021-02-28: qty 100

## 2021-02-28 MED ORDER — SODIUM CHLORIDE 0.9 % IV SOLN
Freq: Once | INTRAVENOUS | Status: AC
  Filled 2021-02-28: qty 250

## 2021-02-28 MED ORDER — SODIUM CHLORIDE 0.9% FLUSH
10.0000 mL | Freq: Once | INTRAVENOUS | Status: AC
  Administered 2021-02-28: 10 mL
  Filled 2021-02-28: qty 10

## 2021-02-28 MED ORDER — SODIUM CHLORIDE 0.9% FLUSH
10.0000 mL | INTRAVENOUS | Status: DC | PRN
  Administered 2021-02-28: 10 mL
  Filled 2021-02-28: qty 10

## 2021-02-28 MED ORDER — SODIUM CHLORIDE 0.9 % IV SOLN
140.0000 mg/m2 | Freq: Once | INTRAVENOUS | Status: AC
  Administered 2021-02-28: 288 mg via INTRAVENOUS
  Filled 2021-02-28: qty 48

## 2021-02-28 MED ORDER — SODIUM CHLORIDE 0.9 % IV SOLN
10.0000 mg | Freq: Once | INTRAVENOUS | Status: AC
  Administered 2021-02-28: 10 mg via INTRAVENOUS
  Filled 2021-02-28: qty 10

## 2021-02-28 NOTE — Progress Notes (Signed)
[place] OFFICE PROGRESS NOTE  Patient Care Team: Janora Norlander, DO as PCP - General (Family Medicine) Lorretta Harp, MD as PCP - Cardiology (Cardiology) Irine Seal, MD (Urology) Paula Compton, MD (Obstetrics and Gynecology) Gatha Mayer, MD (Gastroenterology) Gaynelle Arabian, MD as Consulting Physician (Orthopedic Surgery) Ilean China, RN as Registered Nurse  ASSESSMENT & PLAN:  Endometrial cancer [facility]) She has different side effects from treatment no unexpected We will proceed with treatment as scheduled She has lost some weight since her treatment plan was created and I will adjust the dose of treatment accordingly  Other constipation We had extensive discussions about the importance of aggressive laxative therapy  Arthralgia This is not an unexpected side effects from treatment Recommend round-the-clock acetaminophen  UTI (urinary tract infection) She had recently completed a course of antibiotics therapy I have ordered repeat urine culture We will call her with test results  No orders of the defined types were placed in this encounter.   All questions were answered. The patient knows to call the clinic with any problems, questions or concerns. The total time spent in the appointment was 20 minutes encounter with patients including review of chart and various tests results, discussions about plan of care and coordination of care plan   Heath Lark, MD 02/28/2021 1:04 PM  INTERVAL HISTORY: Please see below for problem oriented charting. She returns for cycle 2 of treatment Cycle 1 was complicated by arthralgia, constipation with subsequent diarrhea, urinary tract infection and skin rash She is now having some mild loose stool Her arthralgia has resolved Symptoms of UTI has resolved  SUMMARY OF ONCOLOGIC HISTORY: Oncology History Overview Note  MSI stable, endometrioid grade 2 Neg genetics   Endometrial cancer ([place])  12/19/2020  Initial Diagnosis   Endometrial cancer ([place])   12/26/2020 Imaging   1. Lower uterine segment/cervical mass without evidence of metastatic disease. 2. Hepatic steatosis. 3. Aortic atherosclerosis (ICD10-I70.0). Coronary artery calcification.   12/29/2020 Pathology Results   FINAL MICROSCOPIC DIAGNOSIS:   A. SENTINEL LYMPH NODE, RIGHT OBTURATOR, EXCISION:  -  No carcinoma identified in one lymph node (0/1)  -  See comment   B. SENTINEL LYMPH NODE, LEFT OBTURATOR, EXCISION:  -  Micrometastasis involving one lymph node (45m/1)  -  See comment   C. UTERUS, CERVIX, BILATERAL TUBES AND OVARIES:  Uterus:  -  Endometrioid carcinoma, FIGO grade 2  -  See oncology table and comment below   Cervix:  -  No carcinoma identified   Bilateral Ovaries:  -  No carcinoma identified   Bilateral Fallopian tubes:  -  No carcinoma identified   ONCOLOGY TABLE:   UTERUS, CARCINOMA OR CARCINOSARCOMA: Resection   Procedure: Total hysterectomy and bilateral salpingo-oophorectomy  Histologic Type: Endometrioid carcinoma  Histologic Grade: FIGO grade 2  Myometrial Invasion:       Percentage of Myometrial Invasion: Estimated to be less than 50%  Uterine Serosa Involvement: Not identified  Cervical stromal Involvement: Not identified  Extent of involvement of other tissue/organs: Not identified  Peritoneal/Ascitic Fluid: Not submitted/unknown  Lymphovascular Invasion: Not identified  Regional Lymph Nodes:       Pelvic Lymph Nodes Examined:  2 Sentinel                                0 non-sentinel  2 total       Pelvic Lymph Nodes with Metastasis: 0                           Macrometastasis: (>2.0 mm): 0                          Micrometastasis: (>0.2 mm and < 2.0 mm): 1                            Isolated Tumor Cells (<0.2 mm): 0                       Laterality of Lymph Node with Tumor: 0                             Extracapsular Extension: N/A  Distant  Metastasis:       Distant Site(s) Involved: N/A  Pathologic Stage Classification (pTNM, AJCC 8th Edition): pT1a, pN9m  Ancillary Studies: MMR / MSI testing will be ordered  Representative Tumor Block: C1  Comment(s): Cytokeratin AE1/3 was performed on the lymph nodes (parts a and B) and supports the presence of micrometastasis (part B only). Drs. PSaralyn Pilarand CEdward Plainfieldreviewed select parts of the case and agree with the above diagnosis.   (v4.2.0.1)     12/29/2020 Surgery   Surgeon: RDonaciano EvaPre-operative Diagnosis: endometrial cancer grade 3    Operation: Robotic-assisted laparoscopic total hysterectomy with bilateral salpingoophorectomy, SLN biopsy   Surgeon: RDonaciano Eva Specimens: uterus, cervix, bilateral tubes and ovaries, right and left obturator SLN           01/22/2021 Cancer Staging   Staging form: Corpus Uteri - Carcinoma and Carcinosarcoma, AJCC 8th Edition - Pathologic stage from 01/22/2021: Stage IIIC1 (pT1a, pN152m cM0) - Signed by GoHeath LarkMD on 01/22/2021  Stage prefix: Initial diagnosis    01/25/2021 Procedure   Successful placement of a right IJ approach Power Port with ultrasound and fluoroscopic guidance. The catheter is ready for use.   02/07/2021 -  Chemotherapy    Patient is on Treatment Plan: UTERINE CARBOPLATIN AUC 6 / PACLITAXEL Q21D       02/11/2021 Genetic Testing   Negative hereditary cancer genetic testing: no pathogenic variants detected in [facility] Common Hereditary Cancers +RNA Panel.  The report date is February 11, 2021.    The Common Hereditary Cancers + RNA Panel offered by [facility] includes sequencing, deletion/duplication, and RNA testing of the following 47 genes: APC, ATM, AXIN2, BARD1, BMPR1A, BRCA1, BRCA2, BRIP1, CDH1, CDK4*, CDKN2A (p14ARF)*, CDKN2A (p16INK4a)*, CHEK2, CTNNA1, DICER1, EPCAM (Deletion/duplication testing only), GREM1 (promoter region deletion/duplication testing only), KIT, MEN1, MLH1, MSH2, MSH3, MSH6,  MUTYH, NBN, NF1, NHTL1, PALB2, PDGFRA*, PMS2, POLD1, POLE, PTEN, RAD50, RAD51C, RAD51D, SDHB, SDHC, SDHD, SMAD4, SMARCA4. STK11, TP53, TSC1, TSC2, and VHL.  The following genes were evaluated for sequence changes only: SDHA and HOXB13 c.251G>A variant only.  RNA analysis is not performed for the * genes.       REVIEW OF SYSTEMS:   Constitutional: Denies fevers, chills or abnormal weight loss Eyes: Denies blurriness of vision Ears, nose, mouth, throat, and face: Denies mucositis or sore throat Respiratory: Denies cough, dyspnea or wheezes Cardiovascular: Denies palpitation, chest discomfort or lower extremity swelling Gastrointestinal:  Denies nausea, heartburn or change in bowel  habits Skin: Denies abnormal skin rashes Lymphatics: Denies new lymphadenopathy or easy bruising Neurological:Denies numbness, tingling or new weaknesses Behavioral/Psych: Mood is stable, no new changes  All other systems were reviewed with the patient and are negative.  I have reviewed the past medical history, past surgical history, social history and family history with the patient and they are unchanged from previous note.  ALLERGIES:  is allergic to tramadol.  MEDICATIONS:  Current Outpatient Medications  Medication Sig Dispense Refill   acetaminophen (TYLENOL) 500 MG tablet Take 1,000 mg by mouth as needed (pain.).     albuterol (VENTOLIN HFA) 108 (90 Base) MCG/ACT inhaler Inhale 2 puffs into the lungs every 6 (six) hours as needed for wheezing or shortness of breath. (Patient taking differently: Inhale 2 puffs into the lungs as needed for wheezing or shortness of breath.) 6.7 g 1   amoxicillin (AMOXIL) 500 MG tablet Take 1 tablet (500 mg total) by mouth 2 (two) times daily. 14 tablet 0   budesonide-formoterol (SYMBICORT) 80-4.5 MCG/ACT inhaler Inhale 2 puffs into the lungs 2 (two) times daily. (Patient taking differently: Inhale 2 puffs into the lungs as needed (respiratory issues.).) 4 each 11   Calcium  Carbonate-Vit D-Min (CALCIUM 1200 PO) Take 1 tablet by mouth in the morning.     Cholecalciferol (VITAMIN D3) 2000 units TABS Take 2,000 Units by mouth in the morning.     dexamethasone (DECADRON) 4 MG tablet Take 2 tabs at the night before and 2 tab the morning of chemotherapy, every 3 weeks, by mouth x 6 cycles 36 tablet 6   docusate sodium (COLACE) 100 MG capsule Take 100 mg by mouth 2 (two) times daily as needed (constipation.).     DULoxetine (CYMBALTA) 30 MG capsule Take 1 capsule (30 mg total) by mouth daily. (Patient taking differently: Take 30 mg by mouth in the morning.) 90 capsule 3   esomeprazole (NEXIUM) 40 MG capsule Take 1 capsule (40 mg total) by mouth daily before breakfast. 90 capsule 3   furosemide (LASIX) 20 MG tablet Take 1 tablet (20 mg total) by mouth daily as needed for fluid. (Patient taking differently: Take 20 mg by mouth as needed for fluid.) 90 tablet 1   lactulose (CHRONULAC) 10 GM/15ML solution Take 15 mLs (10 g total) by mouth 3 (three) times daily. 473 mL 0   levothyroxine (SYNTHROID) 50 MCG tablet TAKE 1 TABLET BY MOUTH  DAILY BEFORE BREAKFAST (Patient taking differently: Take 50 mcg by mouth daily before breakfast.) 90 tablet 3   lidocaine-prilocaine (EMLA) cream Apply to affected area once 30 g 3   losartan-hydrochlorothiazide (HYZAAR) 100-12.5 MG tablet Take 1 tablet by mouth daily. (Patient taking differently: Take 1 tablet by mouth in the morning.) 90 tablet 3   Omega-3 Fatty Acids (FISH OIL) 1000 MG CAPS Take 1,000 mg by mouth in the morning.     ondansetron (ZOFRAN) 8 MG tablet Take 1 tablet (8 mg total) by mouth 2 (two) times daily as needed. Start on the third day after chemotherapy. 30 tablet 1   oxybutynin (DITROPAN) 5 MG tablet Take 2 tablets (10 mg total) by mouth 2 (two) times daily. 360 tablet 3   prochlorperazine (COMPAZINE) 10 MG tablet Take 1 tablet (10 mg total) by mouth every 6 (six) hours as needed (Nausea or vomiting). 30 tablet 1   valACYclovir  (VALTREX) 1000 MG tablet Take 1 tablet twice daily for 7 days.  If you have another flare, take 2 tablets twice a day for  ONE day only per flareup. (Patient taking differently: Take 500 mg by mouth 2 (two) times daily as needed (fever blisters).) 30 tablet 0   vitamin B-12 (CYANOCOBALAMIN) 1000 MCG tablet Take 1,000 mcg by mouth in the morning.     No current facility-administered medications for this visit.   Facility-Administered Medications Ordered in Other Visits  Medication Dose Route Frequency Provider Last Rate Last Admin   CARBOplatin (PARAPLATIN) 640 mg in sodium chloride 0.9 % 250 mL chemo infusion  640 mg Intravenous Once Alvy Bimler, [place]ola Dimmitt, MD       dexamethasone (DECADRON) 10 mg in sodium chloride 0.9 % 50 mL IVPB  10 mg Intravenous Once Alvy Bimler, Osa Fogarty, MD 204 mL/hr at 02/28/21 1257 10 mg at 02/28/21 1257   fosaprepitant (EMEND) 150 mg in sodium chloride 0.9 % 145 mL IVPB  150 mg Intravenous Once Heath Lark, MD 450 mL/hr at 02/28/21 1254 150 mg at 02/28/21 1254   heparin lock flush 100 unit/mL  500 Units Intracatheter Once PRN Heath Lark, MD       PACLitaxel (TAXOL) 288 mg in sodium chloride 0.9 % 250 mL chemo infusion (> 6m/m2)  140 mg/m2 (Treatment Plan Recorded) Intravenous Once GAlvy Bimler Jennifermarie Franzen, MD       sodium chloride flush (NS) 0.9 % injection 10 mL  10 mL Intracatheter PRN GAlvy Bimler Sergio Hobart, MD        PHYSICAL EXAMINATION: ECOG PERFORMANCE STATUS: 1 - Symptomatic but completely ambulatory  Vitals:   02/28/21 1131  BP: (!) 147/79  Pulse: 81  Resp: 18  Temp: 97.9 F (36.6 C)  SpO2: 98%   Filed Weights   02/28/21 1131  Weight: 222 lb 3.2 oz (100.8 kg)    GENERAL:alert, no distress and comfortable SKIN: skin color, texture, turgor are normal, no rashes or significant lesions EYES: normal, Conjunctiva are pink and non-injected, sclera clear OROPHARYNX:no exudate, no erythema and lips, buccal mucosa, and tongue normal  NECK: supple, thyroid normal size, non-tender, without  nodularity LYMPH:  no palpable lymphadenopathy in the cervical, axillary or inguinal LUNGS: clear to auscultation and percussion with normal breathing effort HEART: regular rate & rhythm and no murmurs and no lower extremity edema ABDOMEN:abdomen soft, non-tender and normal bowel sounds Musculoskeletal:no cyanosis of digits and no clubbing  NEURO: alert & oriented x 3 with fluent speech, no focal motor/sensory deficits  LABORATORY DATA:  I have reviewed the data as listed    Component Value Date/Time   NA 138 02/28/2021 1044   NA 141 08/31/2020 1149   K 4.2 02/28/2021 1044   CL 104 02/28/2021 1044   CO2 26 02/28/2021 1044   GLUCOSE 130 (H) 02/28/2021 1044   BUN 14 02/28/2021 1044   BUN 15 08/31/2020 1149   CREATININE 0.74 02/28/2021 1044   CREATININE 0.60 11/03/2012 1228   CALCIUM 10.1 02/28/2021 1044   PROT 7.8 02/28/2021 1044   PROT 7.3 08/31/2020 1149   ALBUMIN 3.8 02/28/2021 1044   ALBUMIN 4.7 08/31/2020 1149   AST 14 (L) 02/28/2021 1044   ALT 16 02/28/2021 1044   ALKPHOS 97 02/28/2021 1044   BILITOT 0.4 02/28/2021 1044   GFRNONAA >60 02/28/2021 1044   GFRAA 91 08/31/2020 1149    No results found for: SPEP, UPEP  Lab Results  Component Value Date   WBC 8.0 02/28/2021   NEUTROABS 7.1 02/28/2021   HGB 13.8 02/28/2021   HCT 40.7 02/28/2021   MCV 90.2 02/28/2021   PLT 230 02/28/2021      Chemistry  Component Value Date/Time   NA 138 02/28/2021 1044   NA 141 08/31/2020 1149   K 4.2 02/28/2021 1044   CL 104 02/28/2021 1044   CO2 26 02/28/2021 1044   BUN 14 02/28/2021 1044   BUN 15 08/31/2020 1149   CREATININE 0.74 02/28/2021 1044   CREATININE 0.60 11/03/2012 1228   GLU 81 07/24/2012 0000      Component Value Date/Time   CALCIUM 10.1 02/28/2021 1044   ALKPHOS 97 02/28/2021 1044   AST 14 (L) 02/28/2021 1044   ALT 16 02/28/2021 1044   BILITOT 0.4 02/28/2021 1044

## 2021-02-28 NOTE — Assessment & Plan Note (Signed)
She has different side effects from treatment no unexpected We will proceed with treatment as scheduled She has lost some weight since her treatment plan was created and I will adjust the dose of treatment accordingly

## 2021-02-28 NOTE — Assessment & Plan Note (Signed)
This is not an unexpected side effects from treatment Recommend round-the-clock acetaminophen

## 2021-02-28 NOTE — Patient Instructions (Signed)
[facility] ONCOLOGY  Discharge Instructions: Thank you for choosing [facility] to provide your oncology and hematology care.   If you have a lab appointment with the [facility], please go directly to the [facility] and check in at the registration area.   Wear comfortable clothing and clothing appropriate for easy access to any Portacath or PICC line.   We strive to give you quality time with your provider. You may need to reschedule your appointment if you arrive late (15 or more minutes).  Arriving late affects you and other patients whose appointments are after yours.  Also, if you miss three or more appointments without notifying the office, you may be dismissed from the clinic at the provider's discretion.      For prescription refill requests, have your pharmacy contact our office and allow 72 hours for refills to be completed.    Today you received the following chemotherapy and/or immunotherapy agents taxol, carboplatin    To help prevent nausea and vomiting after your treatment, we encourage you to take your nausea medication as directed.  BELOW ARE SYMPTOMS THAT SHOULD BE REPORTED IMMEDIATELY: *FEVER GREATER THAN 100.4 F (38 C) OR HIGHER *CHILLS OR SWEATING *NAUSEA AND VOMITING THAT IS NOT CONTROLLED WITH YOUR NAUSEA MEDICATION *UNUSUAL SHORTNESS OF BREATH *UNUSUAL BRUISING OR BLEEDING *URINARY PROBLEMS (pain or burning when urinating, or frequent urination) *BOWEL PROBLEMS (unusual diarrhea, constipation, pain near the anus) TENDERNESS IN MOUTH AND THROAT WITH OR WITHOUT PRESENCE OF ULCERS (sore throat, sores in mouth, or a toothache) UNUSUAL RASH, SWELLING OR PAIN  UNUSUAL VAGINAL DISCHARGE OR ITCHING   Items with * indicate a potential emergency and should be followed up as soon as possible or go to the Emergency Department if any problems should occur.  Please show the CHEMOTHERAPY ALERT CARD or IMMUNOTHERAPY ALERT CARD at  check-in to the Emergency Department and triage nurse.  Should you have questions after your visit or need to cancel or reschedule your appointment, please contact [facility]  Dept: [phone]  and follow the prompts.  Office hours are 8:00 a.m. to 4:30 p.m. Monday - Friday. Please note that voicemails left after 4:00 p.m. may not be returned until the following business day.  We are closed weekends and major holidays. You have access to a nurse at all times for urgent questions. Please call the main number to the clinic Dept: [phone] and follow the prompts.   For any non-urgent questions, you may also contact your provider using MyChart. We now offer e-Visits for anyone 37 and older to request care online for non-urgent symptoms. For details visit [email or web address].   Also download the MyChart app! Go to the app store, search "MyChart", open the app, select [facility], and log in with your MyChart username and password.  Due to Covid, a mask is required upon entering the hospital/clinic. If you do not have a mask, one will be given to you upon arrival. For doctor visits, patients may have 1 support person aged 72 or older with them. For treatment visits, patients cannot have anyone with them due to current Covid guidelines and our immunocompromised population.

## 2021-02-28 NOTE — Research (Signed)
A Randomized pragmatic Chair-Based Home Exercise Intervention for Mitigating Cancer-Related Fatigue in Older Adults Undergoing Chemotherapy for Advanced Disease  This Nurse has reviewed this patient's inclusion and exclusion criteria as a second review and confirms Stacie Russell is eligible for study participation.  Patient may continue with enrollment.  Wells Guiles 'Barnes, RN, BSN Clinical Research Nurse I 02/28/21 10:40 AM

## 2021-02-28 NOTE — Assessment & Plan Note (Signed)
She had recently completed a course of antibiotics therapy I have ordered repeat urine culture We will call her with test results

## 2021-02-28 NOTE — Assessment & Plan Note (Signed)
We had extensive discussions about the importance of aggressive laxative therapy

## 2021-02-28 NOTE — Research (Signed)
A Randomized pragmatic Chair-Based Home Exercise Intervention for Mitigating Cancer-Related Fatigue in Older Adults Undergoing Chemotherapy for Advanced Disease  Consent: Patient Stacie Russell was referred by Dr. Alvy Bimler as a potential candidate for the above listed study.  This Clinical Research Coordinator met with Stacie Russell, MRN [id number] on 02/28/21 in a manner and location that ensures patient privacy to discuss participation in the above listed research study.  Patient is Accompanied by her husband .  Patient was previously provided with informed consent documents.  Patient confirmed they have read the informed consent documents.  As outlined in the informed consent form, this Coordinator and Stacie Russell discussed the purpose of the research study, the investigational nature of the study, study procedures and requirements for study participation, potential risks and benefits of study participation, as well as alternatives to participation.  This study is not blinded or double-blinded. The patient understands participation is voluntary and they may withdraw from study participation at any time.  Each study arm was reviewed, and randomization discussed.  Patient understands enrollment is pending full eligibility review.   Confidentiality and how the patient's information will be used as part of study participation were discussed.  Patient was informed there is reimbursement provided for their time and effort spent on trial participation.  The patient is encouraged to discuss research study participation with their insurance provider to determine what costs they may incur as part of study participation, including research related injury.    All questions were answered to patient's satisfaction.  The informed consent and separate HIPAA Authorization was reviewed page by page.  The patient's mental and emotional status is appropriate to provide informed consent, and the patient verbalizes  an understanding of study participation.  Patient has agreed to participate in the above listed research study and has voluntarily signed the informed consent version date 06/23/2020 and separate HIPAA Authorization, version 5  on 02/28/21 at 10:15 AM.  The patient was provided with a copy of the signed informed consent form and separate HIPAA Authorization for their reference.  No study specific procedures were obtained prior to the signing of the informed consent document.  Approximately 20 minutes were spent with the patient reviewing the informed consent documents.    Eligibility:  This Coordinator has reviewed this patient's inclusion and exclusion criteria and confirmed Stacie Russell is eligible for study participation.  Patient will continue with enrollment.  Second eligibility check was performed by Ruben Im, RN.  Registration:  The patient was registered to the study and randomized to the Standard of Care arm.  The patient was informed of this randomization in the clinic.  PROs:  Baseline PROs including Demographics, PROMIS-F, CES-D, IADL, [place] History, and Efficacy for Exercise scale were completed by the patient in the clinic today and checked for completeness by this clinical research coordinator.  SPPB:  SPPB was performed by this coordinator per protocol.  Gift Card:  The patient was given a $25 [employer] gift card for completion of the required baseline assessments.  The patient was thanked for her time participation in this study.   [name] Clinical Research Coordinator I  02/28/21 11:34 AM

## 2021-03-01 LAB — URINE CULTURE: Culture: 20000 — AB

## 2021-03-02 ENCOUNTER — Telehealth: Payer: Self-pay

## 2021-03-02 NOTE — Telephone Encounter (Signed)
Called and given below message. She verbalized understanding. She is feeling better. Denies fever and burning with urination. Urgency and incontinence is better. She will call the office back for symptoms.

## 2021-03-02 NOTE — Telephone Encounter (Signed)
-----   Message from Heath Lark, MD sent at 03/02/2021  8:04 AM EDT ----- Pls let her know her urine culture was mixed Is she better?

## 2021-03-06 ENCOUNTER — Telehealth: Payer: Self-pay

## 2021-03-06 ENCOUNTER — Other Ambulatory Visit: Payer: Self-pay | Admitting: Hematology and Oncology

## 2021-03-06 DIAGNOSIS — R300 Dysuria: Secondary | ICD-10-CM | POA: Insufficient documentation

## 2021-03-06 DIAGNOSIS — C541 Malignant neoplasm of endometrium: Secondary | ICD-10-CM

## 2021-03-06 NOTE — Telephone Encounter (Signed)
Pls schedule labs and see me tomorrow

## 2021-03-06 NOTE — Telephone Encounter (Signed)
Returned her call. She is complaining of feeling exhausted and has so much fatigue that started last Thursday. She is feeling very weak and feels like she has a UTI. Denies fever. Had burning with urination last night. Having urgency and then urinary incontinence. She is able to eat more today. If she pushes herself to do more she feels like she may pass out. She is just sitting around the house.

## 2021-03-06 NOTE — Telephone Encounter (Signed)
Called and scheduled appts for tomorrow. She is aware of appt times.

## 2021-03-07 ENCOUNTER — Inpatient Hospital Stay: Payer: Medicare Other | Admitting: Hematology and Oncology

## 2021-03-07 ENCOUNTER — Inpatient Hospital Stay: Payer: Medicare Other

## 2021-03-07 ENCOUNTER — Encounter: Payer: Self-pay | Admitting: Hematology and Oncology

## 2021-03-07 ENCOUNTER — Other Ambulatory Visit: Payer: Self-pay

## 2021-03-07 VITALS — BP 128/75 | HR 99 | Temp 97.7°F | Resp 18 | Ht 61.0 in | Wt 220.0 lb

## 2021-03-07 DIAGNOSIS — C541 Malignant neoplasm of endometrium: Secondary | ICD-10-CM

## 2021-03-07 DIAGNOSIS — R300 Dysuria: Secondary | ICD-10-CM

## 2021-03-07 DIAGNOSIS — Z79899 Other long term (current) drug therapy: Secondary | ICD-10-CM | POA: Diagnosis not present

## 2021-03-07 DIAGNOSIS — M2550 Pain in unspecified joint: Secondary | ICD-10-CM | POA: Diagnosis not present

## 2021-03-07 DIAGNOSIS — Z5111 Encounter for antineoplastic chemotherapy: Secondary | ICD-10-CM | POA: Diagnosis not present

## 2021-03-07 DIAGNOSIS — N390 Urinary tract infection, site not specified: Secondary | ICD-10-CM

## 2021-03-07 DIAGNOSIS — I2510 Atherosclerotic heart disease of native coronary artery without angina pectoris: Secondary | ICD-10-CM | POA: Diagnosis not present

## 2021-03-07 DIAGNOSIS — Z888 Allergy status to other drugs, medicaments and biological substances status: Secondary | ICD-10-CM | POA: Diagnosis not present

## 2021-03-07 DIAGNOSIS — Z885 Allergy status to narcotic agent status: Secondary | ICD-10-CM | POA: Diagnosis not present

## 2021-03-07 DIAGNOSIS — R5383 Other fatigue: Secondary | ICD-10-CM | POA: Diagnosis not present

## 2021-03-07 DIAGNOSIS — R630 Anorexia: Secondary | ICD-10-CM | POA: Insufficient documentation

## 2021-03-07 DIAGNOSIS — R634 Abnormal weight loss: Secondary | ICD-10-CM | POA: Diagnosis not present

## 2021-03-07 DIAGNOSIS — R197 Diarrhea, unspecified: Secondary | ICD-10-CM | POA: Diagnosis not present

## 2021-03-07 DIAGNOSIS — K5909 Other constipation: Secondary | ICD-10-CM | POA: Diagnosis not present

## 2021-03-07 DIAGNOSIS — I700 Atherosclerosis of aorta: Secondary | ICD-10-CM | POA: Diagnosis not present

## 2021-03-07 DIAGNOSIS — N3000 Acute cystitis without hematuria: Secondary | ICD-10-CM

## 2021-03-07 DIAGNOSIS — K760 Fatty (change of) liver, not elsewhere classified: Secondary | ICD-10-CM | POA: Diagnosis not present

## 2021-03-07 DIAGNOSIS — R21 Rash and other nonspecific skin eruption: Secondary | ICD-10-CM | POA: Diagnosis not present

## 2021-03-07 LAB — URINALYSIS, COMPLETE (UACMP) WITH MICROSCOPIC
Bilirubin Urine: NEGATIVE
Glucose, UA: NEGATIVE mg/dL
Ketones, ur: NEGATIVE mg/dL
Nitrite: NEGATIVE
Protein, ur: NEGATIVE mg/dL
Specific Gravity, Urine: 1.005 (ref 1.005–1.030)
WBC, UA: >50 WBC/hpf — ABNORMAL HIGH (ref 0–5)
pH: 8 (ref 5.0–8.0)

## 2021-03-07 LAB — CBC WITH DIFFERENTIAL (CANCER CENTER ONLY)
Abs Immature Granulocytes: 0.02 10*3/uL (ref 0.00–0.07)
Basophils Absolute: 0 10*3/uL (ref 0.0–0.1)
Basophils Relative: 1 %
Eosinophils Absolute: 0.1 10*3/uL (ref 0.0–0.5)
Eosinophils Relative: 2 %
HCT: 40.2 % (ref 36.0–46.0)
Hemoglobin: 13.8 g/dL (ref 12.0–15.0)
Immature Granulocytes: 1 %
Lymphocytes Relative: 38 %
Lymphs Abs: 1.5 10*3/uL (ref 0.7–4.0)
MCH: 30.9 pg (ref 26.0–34.0)
MCHC: 34.3 g/dL (ref 30.0–36.0)
MCV: 89.9 fL (ref 80.0–100.0)
Monocytes Absolute: 0.2 10*3/uL (ref 0.1–1.0)
Monocytes Relative: 4 %
Neutro Abs: 2.3 10*3/uL (ref 1.7–7.7)
Neutrophils Relative %: 54 %
Platelet Count: 183 10*3/uL (ref 150–400)
RBC: 4.47 MIL/uL (ref 3.87–5.11)
RDW: 12.8 % (ref 11.5–15.5)
WBC Count: 4.1 10*3/uL (ref 4.0–10.5)
nRBC: 0 % (ref 0.0–0.2)

## 2021-03-07 LAB — CMP (CANCER CENTER ONLY)
ALT: 19 U/L (ref 0–44)
AST: 15 U/L (ref 15–41)
Albumin: 3.7 g/dL (ref 3.5–5.0)
Alkaline Phosphatase: 87 U/L (ref 38–126)
Anion gap: 10 (ref 5–15)
BUN: 16 mg/dL (ref 8–23)
CO2: 26 mmol/L (ref 22–32)
Calcium: 10.1 mg/dL (ref 8.9–10.3)
Chloride: 98 mmol/L (ref 98–111)
Creatinine: 0.73 mg/dL (ref 0.44–1.00)
GFR, Estimated: >60 mL/min (ref >60)
Glucose, Bld: 99 mg/dL (ref 70–99)
Potassium: 4.1 mmol/L (ref 3.5–5.1)
Sodium: 134 mmol/L — ABNORMAL LOW (ref 135–145)
Total Bilirubin: 0.9 mg/dL (ref 0.3–1.2)
Total Protein: 7 g/dL (ref 6.5–8.1)

## 2021-03-07 MED ORDER — ALTEPLASE 2 MG IJ SOLR
2.0000 mg | Freq: Once | INTRAMUSCULAR | Status: DC | PRN
  Filled 2021-03-07: qty 2

## 2021-03-07 MED ORDER — SODIUM CHLORIDE 0.9 % IV SOLN
Freq: Once | INTRAVENOUS | Status: AC
  Filled 2021-03-07: qty 250

## 2021-03-07 MED ORDER — SODIUM CHLORIDE 0.9% FLUSH
10.0000 mL | Freq: Once | INTRAVENOUS | Status: AC
  Administered 2021-03-07: 10 mL
  Filled 2021-03-07: qty 10

## 2021-03-07 MED ORDER — HEPARIN SOD (PORK) LOCK FLUSH 100 UNIT/ML IV SOLN
500.0000 [IU] | Freq: Once | INTRAVENOUS | Status: AC
  Administered 2021-03-07: 500 [IU]
  Filled 2021-03-07: qty 5

## 2021-03-07 MED ORDER — HEPARIN SOD (PORK) LOCK FLUSH 100 UNIT/ML IV SOLN
500.0000 [IU] | Freq: Once | INTRAVENOUS | Status: AC | PRN
Start: 2021-03-07 — End: 2021-03-07
  Administered 2021-03-07: 500 [IU]
  Filled 2021-03-07: qty 5

## 2021-03-07 NOTE — Patient Instructions (Signed)

## 2021-03-07 NOTE — Assessment & Plan Note (Signed)
She has profound constipation Recommend daily laxatives I will call her tomorrow to check on her

## 2021-03-07 NOTE — Progress Notes (Signed)
[facility] OFFICE PROGRESS NOTE  Patient Care Team: Janora Norlander, DO as PCP - General (Family Medicine) Lorretta Harp, MD as PCP - Cardiology (Cardiology) Irine Seal, MD (Urology) Paula Compton, MD (Obstetrics and Gynecology) Gatha Mayer, MD (Gastroenterology) Gaynelle Arabian, MD as Consulting Physician (Orthopedic Surgery) Ilean China, RN as Registered Nurse  ASSESSMENT & PLAN:  Endometrial cancer [facility]) She is not doing well since recent chemotherapy I suspect her recent constipation might have caused nausea, reduced appetite, poor oral intake, dehydration, possible urinary tract infection and weakness I recommend IV fluid support today We will collect urine sample and call her with test results We discussed the importance of aggressive supportive care I will call her tomorrow to check on her  UTI (urinary tract infection) The patient has complained of dysuria I will order urinalysis and urine culture  Other constipation She has profound constipation Recommend daily laxatives I will call her tomorrow to check on her  Anorexia Her poor oral intake is likely exacerbated by nausea, constipation and dehydration I recommend frequent small meals To give her piece of paper to document her oral intake and will call her to check on her  Orders Placed This Encounter  Procedures   TSH    Standing Status:   Future    Standing Expiration Date:   03/07/2022   T4, free    Standing Status:   Future    Standing Expiration Date:   03/07/2022    All questions were answered. The patient knows to call the clinic with any problems, questions or concerns. The total time spent in the appointment was 30 minutes encounter with patients including review of chart and various tests results, discussions about plan of care and coordination of care plan   Heath Lark, MD 03/07/2021 3:23 PM  INTERVAL HISTORY: Please see below for problem oriented charting. She  is seen urgently She has not been feeling well She thought she is about to pass out yesterday Since chemotherapy, she was profoundly constipated She had no bowel movement for 6 days Yesterday, she had 3 small bowel movement She had nausea but has not been taking antiemetics on a regular basis Her appetite is poor Her oral intake is poor She complains of generalized weakness  SUMMARY OF ONCOLOGIC HISTORY: Oncology History Overview Note  MSI stable, endometrioid grade 2 Neg genetics   Endometrial cancer ([place])  12/19/2020 Initial Diagnosis   Endometrial cancer ([place])   12/26/2020 Imaging   1. Lower uterine segment/cervical mass without evidence of metastatic disease. 2. Hepatic steatosis. 3. Aortic atherosclerosis (ICD10-I70.0). Coronary artery calcification.   12/29/2020 Pathology Results   FINAL MICROSCOPIC DIAGNOSIS:   A. SENTINEL LYMPH NODE, RIGHT OBTURATOR, EXCISION:  -  No carcinoma identified in one lymph node (0/1)  -  See comment   B. SENTINEL LYMPH NODE, LEFT OBTURATOR, EXCISION:  -  Micrometastasis involving one lymph node (82m/1)  -  See comment   C. UTERUS, CERVIX, BILATERAL TUBES AND OVARIES:  Uterus:  -  Endometrioid carcinoma, FIGO grade 2  -  See oncology table and comment below   Cervix:  -  No carcinoma identified   Bilateral Ovaries:  -  No carcinoma identified   Bilateral Fallopian tubes:  -  No carcinoma identified   ONCOLOGY TABLE:   UTERUS, CARCINOMA OR CARCINOSARCOMA: Resection   Procedure: Total hysterectomy and bilateral salpingo-oophorectomy  Histologic Type: Endometrioid carcinoma  Histologic Grade: FIGO grade 2  Myometrial Invasion:  Percentage of Myometrial Invasion: Estimated to be less than 50%  Uterine Serosa Involvement: Not identified  Cervical stromal Involvement: Not identified  Extent of involvement of other tissue/organs: Not identified  Peritoneal/Ascitic Fluid: Not submitted/unknown  Lymphovascular Invasion: Not  identified  Regional Lymph Nodes:       Pelvic Lymph Nodes Examined:  2 Sentinel                                0 non-sentinel                                   2 total       Pelvic Lymph Nodes with Metastasis: 0                           Macrometastasis: (>2.0 mm): 0                          Micrometastasis: (>0.2 mm and < 2.0 mm): 1                            Isolated Tumor Cells (<0.2 mm): 0                       Laterality of Lymph Node with Tumor: 0                             Extracapsular Extension: N/A  Distant Metastasis:       Distant Site(s) Involved: N/A  Pathologic Stage Classification (pTNM, AJCC 8th Edition): pT1a, pN51m  Ancillary Studies: MMR / MSI testing will be ordered  Representative Tumor Block: C1  Comment(s): Cytokeratin AE1/3 was performed on the lymph nodes (parts a and B) and supports the presence of micrometastasis (part B only). Drs. PSaralyn Pilarand CWest[facility] LLCreviewed select parts of the case and agree with the above diagnosis.   (v4.2.0.1)     12/29/2020 Surgery   Surgeon: RDonaciano EvaPre-operative Diagnosis: endometrial cancer grade 3    Operation: Robotic-assisted laparoscopic total hysterectomy with bilateral salpingoophorectomy, SLN biopsy   Surgeon: RDonaciano Eva Specimens: uterus, cervix, bilateral tubes and ovaries, right and left obturator SLN           01/22/2021 Cancer Staging   Staging form: Corpus Uteri - Carcinoma and Carcinosarcoma, AJCC 8th Edition - Pathologic stage from 01/22/2021: Stage IIIC1 (pT1a, pN128m cM0) - Signed by GoHeath LarkMD on 01/22/2021  Stage prefix: Initial diagnosis    01/25/2021 Procedure   Successful placement of a right IJ approach Power Port with ultrasound and fluoroscopic guidance. The catheter is ready for use.   02/07/2021 -  Chemotherapy    Patient is on Treatment Plan: UTERINE CARBOPLATIN AUC 6 / PACLITAXEL Q21D       02/11/2021 Genetic Testing   Negative hereditary cancer genetic testing:  no pathogenic variants detected in [facility] Common Hereditary Cancers +RNA Panel.  The report date is February 11, 2021.    The Common Hereditary Cancers + RNA Panel offered by [facility] includes sequencing, deletion/duplication, and RNA testing of the following 47 genes: APC, ATM, AXIN2, BARD1, BMPR1A, BRCA1, BRCA2, BRIP1, CDH1, CDK4*, CDKN2A (p14ARF)*, CDKN2A (p16INK4a)*, CHEK2, CTNNA1, DICER1,  EPCAM (Deletion/duplication testing only), GREM1 (promoter region deletion/duplication testing only), KIT, MEN1, MLH1, MSH2, MSH3, MSH6, MUTYH, NBN, NF1, NHTL1, PALB2, PDGFRA*, PMS2, POLD1, POLE, PTEN, RAD50, RAD51C, RAD51D, SDHB, SDHC, SDHD, SMAD4, SMARCA4. STK11, TP53, TSC1, TSC2, and VHL.  The following genes were evaluated for sequence changes only: SDHA and HOXB13 c.251G>A variant only.  RNA analysis is not performed for the * genes.       REVIEW OF SYSTEMS:   Constitutional: Denies fevers, chills or abnormal weight loss Eyes: Denies blurriness of vision Ears, nose, mouth, throat, and face: Denies mucositis or sore throat Respiratory: Denies cough, dyspnea or wheezes Cardiovascular: Denies palpitation, chest discomfort or lower extremity swelling Skin: Denies abnormal skin rashes Lymphatics: Denies new lymphadenopathy or easy bruising Behavioral/Psych: Mood is stable, no new changes  All other systems were reviewed with the patient and are negative.  I have reviewed the past medical history, past surgical history, social history and family history with the patient and they are unchanged from previous note.  ALLERGIES:  is allergic to tramadol.  MEDICATIONS:  Current Outpatient Medications  Medication Sig Dispense Refill   acetaminophen (TYLENOL) 500 MG tablet Take 1,000 mg by mouth as needed (pain.).     albuterol (VENTOLIN HFA) 108 (90 Base) MCG/ACT inhaler Inhale 2 puffs into the lungs every 6 (six) hours as needed for wheezing or shortness of breath. (Patient taking differently: Inhale 2 puffs into  the lungs as needed for wheezing or shortness of breath.) 6.7 g 1   amoxicillin (AMOXIL) 500 MG tablet Take 1 tablet (500 mg total) by mouth 2 (two) times daily. 14 tablet 0   budesonide-formoterol (SYMBICORT) 80-4.5 MCG/ACT inhaler Inhale 2 puffs into the lungs 2 (two) times daily. (Patient taking differently: Inhale 2 puffs into the lungs as needed (respiratory issues.).) 4 each 11   Calcium Carbonate-Vit D-Min (CALCIUM 1200 PO) Take 1 tablet by mouth in the morning.     Cholecalciferol (VITAMIN D3) 2000 units TABS Take 2,000 Units by mouth in the morning.     dexamethasone (DECADRON) 4 MG tablet Take 2 tabs at the night before and 2 tab the morning of chemotherapy, every 3 weeks, by mouth x 6 cycles 36 tablet 6   docusate sodium (COLACE) 100 MG capsule Take 100 mg by mouth 2 (two) times daily as needed (constipation.).     DULoxetine (CYMBALTA) 30 MG capsule Take 1 capsule (30 mg total) by mouth daily. (Patient taking differently: Take 30 mg by mouth in the morning.) 90 capsule 3   esomeprazole (NEXIUM) 40 MG capsule Take 1 capsule (40 mg total) by mouth daily before breakfast. 90 capsule 3   furosemide (LASIX) 20 MG tablet Take 1 tablet (20 mg total) by mouth daily as needed for fluid. (Patient taking differently: Take 20 mg by mouth as needed for fluid.) 90 tablet 1   lactulose (CHRONULAC) 10 GM/15ML solution Take 15 mLs (10 g total) by mouth 3 (three) times daily. 473 mL 0   levothyroxine (SYNTHROID) 50 MCG tablet TAKE 1 TABLET BY MOUTH  DAILY BEFORE BREAKFAST (Patient taking differently: Take 50 mcg by mouth daily before breakfast.) 90 tablet 3   lidocaine-prilocaine (EMLA) cream Apply to affected area once 30 g 3   losartan-hydrochlorothiazide (HYZAAR) 100-12.5 MG tablet Take 1 tablet by mouth daily. (Patient taking differently: Take 1 tablet by mouth in the morning.) 90 tablet 3   Omega-3 Fatty Acids (FISH OIL) 1000 MG CAPS Take 1,000 mg by mouth in the morning.  ondansetron (ZOFRAN) 8 MG  tablet Take 1 tablet (8 mg total) by mouth 2 (two) times daily as needed. Start on the third day after chemotherapy. 30 tablet 1   oxybutynin (DITROPAN) 5 MG tablet Take 2 tablets (10 mg total) by mouth 2 (two) times daily. 360 tablet 3   prochlorperazine (COMPAZINE) 10 MG tablet Take 1 tablet (10 mg total) by mouth every 6 (six) hours as needed (Nausea or vomiting). 30 tablet 1   valACYclovir (VALTREX) 1000 MG tablet Take 1 tablet twice daily for 7 days.  If you have another flare, take 2 tablets twice a day for ONE day only per flareup. (Patient taking differently: Take 500 mg by mouth 2 (two) times daily as needed (fever blisters).) 30 tablet 0   vitamin B-12 (CYANOCOBALAMIN) 1000 MCG tablet Take 1,000 mcg by mouth in the morning.     No current facility-administered medications for this visit.   Facility-Administered Medications Ordered in Other Visits  Medication Dose Route Frequency Provider Last Rate Last Admin   alteplase (CATHFLO ACTIVASE) injection 2 mg  2 mg Intracatheter Once PRN Heath Lark, MD        PHYSICAL EXAMINATION: ECOG PERFORMANCE STATUS: 2 - Symptomatic, <50% confined to bed  Vitals:   03/07/21 1207  BP: 128/75  Pulse: 99  Resp: 18  Temp: 97.7 F (36.5 C)  SpO2: 100%   Filed Weights   03/07/21 1207  Weight: 220 lb (99.8 kg)    GENERAL:alert, no distress and comfortable Musculoskeletal:no cyanosis of digits and no clubbing  NEURO: alert & oriented x 3 with fluent speech, no focal motor/sensory deficits  LABORATORY DATA:  I have reviewed the data as listed    Component Value Date/Time   NA 134 (L) 03/07/2021 1137   NA 141 08/31/2020 1149   K 4.1 03/07/2021 1137   CL 98 03/07/2021 1137   CO2 26 03/07/2021 1137   GLUCOSE 99 03/07/2021 1137   BUN 16 03/07/2021 1137   BUN 15 08/31/2020 1149   CREATININE 0.73 03/07/2021 1137   CREATININE 0.60 11/03/2012 1228   CALCIUM 10.1 03/07/2021 1137   PROT 7.0 03/07/2021 1137   PROT 7.3 08/31/2020 1149    ALBUMIN 3.7 03/07/2021 1137   ALBUMIN 4.7 08/31/2020 1149   AST 15 03/07/2021 1137   ALT 19 03/07/2021 1137   ALKPHOS 87 03/07/2021 1137   BILITOT 0.9 03/07/2021 1137   GFRNONAA >60 03/07/2021 1137   GFRAA 91 08/31/2020 1149    No results found for: SPEP, UPEP  Lab Results  Component Value Date   WBC 4.1 03/07/2021   NEUTROABS 2.3 03/07/2021   HGB 13.8 03/07/2021   HCT 40.2 03/07/2021   MCV 89.9 03/07/2021   PLT 183 03/07/2021      Chemistry      Component Value Date/Time   NA 134 (L) 03/07/2021 1137   NA 141 08/31/2020 1149   K 4.1 03/07/2021 1137   CL 98 03/07/2021 1137   CO2 26 03/07/2021 1137   BUN 16 03/07/2021 1137   BUN 15 08/31/2020 1149   CREATININE 0.73 03/07/2021 1137   CREATININE 0.60 11/03/2012 1228   GLU 81 07/24/2012 0000      Component Value Date/Time   CALCIUM 10.1 03/07/2021 1137   ALKPHOS 87 03/07/2021 1137   AST 15 03/07/2021 1137   ALT 19 03/07/2021 1137   BILITOT 0.9 03/07/2021 1137

## 2021-03-07 NOTE — Assessment & Plan Note (Signed)
The patient has complained of dysuria I will order urinalysis and urine culture

## 2021-03-07 NOTE — Assessment & Plan Note (Signed)
Her poor oral intake is likely exacerbated by nausea, constipation and dehydration I recommend frequent small meals To give her piece of paper to document her oral intake and will call her to check on her

## 2021-03-07 NOTE — Assessment & Plan Note (Signed)
She is not doing well since recent chemotherapy I suspect her recent constipation might have caused nausea, reduced appetite, poor oral intake, dehydration, possible urinary tract infection and weakness I recommend IV fluid support today We will collect urine sample and call her with test results We discussed the importance of aggressive supportive care I will call her tomorrow to check on her

## 2021-03-08 ENCOUNTER — Telehealth: Payer: Self-pay

## 2021-03-08 NOTE — Telephone Encounter (Signed)
Thanks for calling her.

## 2021-03-08 NOTE — Telephone Encounter (Signed)
Spoke with patient to follow up with oral intake.    Patient reports significant improvement since 7/26.  Patient reports she was able to eat and tolerate dinner last night, with approximately 48oz of water throughout the night.  She will attempt breakfast this AM.    Nausea has decreased since yesterday, continues with minimal weakness.  Patient stated, "I'm not as weak as yesterday though."   1 regular BM on 7/26.    RN encouraged patient to continue with fluids, and small meals.  Pt verbalized understanding.  Pt will call clinic if she is unable to tolerate food/fluids today.

## 2021-03-08 NOTE — Telephone Encounter (Signed)
-----   Message from Heath Lark, MD sent at 03/08/2021  8:26 AM EDT ----- Can you call and check on her She had IVF yesterday How is her oral intake Is she having regular BM<

## 2021-03-09 ENCOUNTER — Telehealth: Payer: Self-pay

## 2021-03-09 ENCOUNTER — Other Ambulatory Visit: Payer: Self-pay | Admitting: Hematology and Oncology

## 2021-03-09 LAB — URINE CULTURE: Culture: 80000 — AB

## 2021-03-09 MED ORDER — CIPROFLOXACIN HCL 250 MG PO TABS: 250.0000 mg | ORAL_TABLET | Freq: Two times a day (BID) | ORAL | 0 refills | Status: DC

## 2021-03-09 NOTE — Telephone Encounter (Signed)
-----   Message from Heath Lark, MD sent at 03/09/2021 11:36 AM EDT ----- Tell her urin culture showed UTI Since she just finished amoxicillin I will send a 5 days course of cipro Is she hydrating ok?  Need IVF? Is her bowel moving?

## 2021-03-09 NOTE — Telephone Encounter (Signed)
Called and given below message. She verbalized understanding. She is feeling much better and appreciated the call. She is eating and drinking with no problems. She does not need IV fluids. Last bm yesterday and she is still feeling constipated. She is taking Miralax daily and lactulose daily. Instructed to take Lactulose TID. Miralax BID and Senokot 2 tabs TID. She verbalized understanding and will call back tomorrow with update.

## 2021-03-14 DIAGNOSIS — N3020 Other chronic cystitis without hematuria: Secondary | ICD-10-CM | POA: Diagnosis not present

## 2021-03-21 ENCOUNTER — Inpatient Hospital Stay: Payer: Medicare Other | Attending: Gynecologic Oncology

## 2021-03-21 ENCOUNTER — Inpatient Hospital Stay: Payer: Medicare Other | Admitting: Hematology and Oncology

## 2021-03-21 ENCOUNTER — Inpatient Hospital Stay: Payer: Medicare Other

## 2021-03-21 ENCOUNTER — Encounter: Payer: Self-pay | Admitting: Hematology and Oncology

## 2021-03-21 ENCOUNTER — Other Ambulatory Visit: Payer: Self-pay

## 2021-03-21 DIAGNOSIS — R110 Nausea: Secondary | ICD-10-CM | POA: Insufficient documentation

## 2021-03-21 DIAGNOSIS — G5600 Carpal tunnel syndrome, unspecified upper limb: Secondary | ICD-10-CM | POA: Diagnosis not present

## 2021-03-21 DIAGNOSIS — K5909 Other constipation: Secondary | ICD-10-CM

## 2021-03-21 DIAGNOSIS — Z5111 Encounter for antineoplastic chemotherapy: Secondary | ICD-10-CM | POA: Insufficient documentation

## 2021-03-21 DIAGNOSIS — I700 Atherosclerosis of aorta: Secondary | ICD-10-CM | POA: Insufficient documentation

## 2021-03-21 DIAGNOSIS — C541 Malignant neoplasm of endometrium: Secondary | ICD-10-CM

## 2021-03-21 DIAGNOSIS — K5900 Constipation, unspecified: Secondary | ICD-10-CM | POA: Insufficient documentation

## 2021-03-21 DIAGNOSIS — G629 Polyneuropathy, unspecified: Secondary | ICD-10-CM | POA: Diagnosis not present

## 2021-03-21 DIAGNOSIS — Z79899 Other long term (current) drug therapy: Secondary | ICD-10-CM | POA: Insufficient documentation

## 2021-03-21 DIAGNOSIS — R5383 Other fatigue: Secondary | ICD-10-CM

## 2021-03-21 LAB — CMP (CANCER CENTER ONLY)
ALT: 19 U/L (ref 0–44)
AST: 13 U/L — ABNORMAL LOW (ref 15–41)
Albumin: 3.6 g/dL (ref 3.5–5.0)
Alkaline Phosphatase: 89 U/L (ref 38–126)
Anion gap: 7 (ref 5–15)
BUN: 16 mg/dL (ref 8–23)
CO2: 27 mmol/L (ref 22–32)
Calcium: 10 mg/dL (ref 8.9–10.3)
Chloride: 107 mmol/L (ref 98–111)
Creatinine: 0.73 mg/dL (ref 0.44–1.00)
GFR, Estimated: >60 mL/min (ref >60)
Glucose, Bld: 111 mg/dL — ABNORMAL HIGH (ref 70–99)
Potassium: 4.5 mmol/L (ref 3.5–5.1)
Sodium: 141 mmol/L (ref 135–145)
Total Bilirubin: 0.3 mg/dL (ref 0.3–1.2)
Total Protein: 6.9 g/dL (ref 6.5–8.1)

## 2021-03-21 LAB — CBC WITH DIFFERENTIAL (CANCER CENTER ONLY)
Abs Immature Granulocytes: 0.02 10*3/uL (ref 0.00–0.07)
Basophils Absolute: 0 10*3/uL (ref 0.0–0.1)
Basophils Relative: 0 %
Eosinophils Absolute: 0.1 10*3/uL (ref 0.0–0.5)
Eosinophils Relative: 1 %
HCT: 37 % (ref 36.0–46.0)
Hemoglobin: 12.4 g/dL (ref 12.0–15.0)
Immature Granulocytes: 0 %
Lymphocytes Relative: 26 %
Lymphs Abs: 1.5 10*3/uL (ref 0.7–4.0)
MCH: 31 pg (ref 26.0–34.0)
MCHC: 33.5 g/dL (ref 30.0–36.0)
MCV: 92.5 fL (ref 80.0–100.0)
Monocytes Absolute: 0.5 10*3/uL (ref 0.1–1.0)
Monocytes Relative: 9 %
Neutro Abs: 3.7 10*3/uL (ref 1.7–7.7)
Neutrophils Relative %: 64 %
Platelet Count: 157 10*3/uL (ref 150–400)
RBC: 4 MIL/uL (ref 3.87–5.11)
RDW: 14.4 % (ref 11.5–15.5)
WBC Count: 5.9 10*3/uL (ref 4.0–10.5)
nRBC: 0 % (ref 0.0–0.2)

## 2021-03-21 LAB — T4, FREE: Free T4: 0.78 ng/dL (ref 0.61–1.12)

## 2021-03-21 LAB — TSH: TSH: 3.209 u[IU]/mL (ref 0.308–3.960)

## 2021-03-21 MED ORDER — SODIUM CHLORIDE 0.9 % IV SOLN
642.6000 mg | Freq: Once | INTRAVENOUS | Status: AC
  Administered 2021-03-21: 640 mg via INTRAVENOUS
  Filled 2021-03-21: qty 64

## 2021-03-21 MED ORDER — DIPHENHYDRAMINE HCL 50 MG/ML IJ SOLN
INTRAMUSCULAR | Status: AC
  Filled 2021-03-21: qty 1

## 2021-03-21 MED ORDER — SODIUM CHLORIDE 0.9 % IV SOLN
150.0000 mg | Freq: Once | INTRAVENOUS | Status: AC
  Administered 2021-03-21: 150 mg via INTRAVENOUS
  Filled 2021-03-21: qty 150

## 2021-03-21 MED ORDER — PALONOSETRON HCL INJECTION 0.25 MG/5ML
INTRAVENOUS | Status: AC
  Filled 2021-03-21: qty 5

## 2021-03-21 MED ORDER — PALONOSETRON HCL INJECTION 0.25 MG/5ML
0.2500 mg | Freq: Once | INTRAVENOUS | Status: AC
  Administered 2021-03-21: 0.25 mg via INTRAVENOUS

## 2021-03-21 MED ORDER — SODIUM CHLORIDE 0.9 % IV SOLN
10.0000 mg | Freq: Once | INTRAVENOUS | Status: AC
  Administered 2021-03-21: 10 mg via INTRAVENOUS
  Filled 2021-03-21: qty 10

## 2021-03-21 MED ORDER — FAMOTIDINE 20 MG IN NS 100 ML IVPB
20.0000 mg | Freq: Once | INTRAVENOUS | Status: AC
  Administered 2021-03-21: 20 mg via INTRAVENOUS

## 2021-03-21 MED ORDER — FAMOTIDINE 20 MG IN NS 100 ML IVPB
INTRAVENOUS | Status: AC
  Filled 2021-03-21: qty 100

## 2021-03-21 MED ORDER — SODIUM CHLORIDE 0.9% FLUSH
10.0000 mL | INTRAVENOUS | Status: DC | PRN
  Administered 2021-03-21: 10 mL
  Filled 2021-03-21: qty 10

## 2021-03-21 MED ORDER — SODIUM CHLORIDE 0.9% FLUSH
10.0000 mL | Freq: Once | INTRAVENOUS | Status: AC
Start: 2021-03-21 — End: 2021-03-21
  Administered 2021-03-21: 10 mL
  Filled 2021-03-21: qty 10

## 2021-03-21 MED ORDER — HEPARIN SOD (PORK) LOCK FLUSH 100 UNIT/ML IV SOLN
500.0000 [IU] | Freq: Once | INTRAVENOUS | Status: AC | PRN
  Administered 2021-03-21: 500 [IU]
  Filled 2021-03-21: qty 5

## 2021-03-21 MED ORDER — SODIUM CHLORIDE 0.9 % IV SOLN
131.2500 mg/m2 | Freq: Once | INTRAVENOUS | Status: AC
  Administered 2021-03-21: 270 mg via INTRAVENOUS
  Filled 2021-03-21: qty 45

## 2021-03-21 MED ORDER — SODIUM CHLORIDE 0.9 % IV SOLN
Freq: Once | INTRAVENOUS | Status: AC
  Filled 2021-03-21: qty 250

## 2021-03-21 MED ORDER — DIPHENHYDRAMINE HCL 50 MG/ML IJ SOLN
25.0000 mg | Freq: Once | INTRAMUSCULAR | Status: AC
  Administered 2021-03-21: 25 mg via INTRAVENOUS

## 2021-03-21 NOTE — Patient Instructions (Signed)
[facility] ONCOLOGY  Discharge Instructions: Thank you for choosing [facility] to provide your oncology and hematology care.   If you have a lab appointment with the [facility], please go directly to the [facility] and check in at the registration area.   Wear comfortable clothing and clothing appropriate for easy access to any Portacath or PICC line.   We strive to give you quality time with your provider. You may need to reschedule your appointment if you arrive late (15 or more minutes).  Arriving late affects you and other patients whose appointments are after yours.  Also, if you miss three or more appointments without notifying the office, you may be dismissed from the clinic at the provider's discretion.      For prescription refill requests, have your pharmacy contact our office and allow 72 hours for refills to be completed.    Today you received the following chemotherapy and/or immunotherapy agents taxol and Carboplatin      To help prevent nausea and vomiting after your treatment, we encourage you to take your nausea medication as directed.  BELOW ARE SYMPTOMS THAT SHOULD BE REPORTED IMMEDIATELY: *FEVER GREATER THAN 100.4 F (38 C) OR HIGHER *CHILLS OR SWEATING *NAUSEA AND VOMITING THAT IS NOT CONTROLLED WITH YOUR NAUSEA MEDICATION *UNUSUAL SHORTNESS OF BREATH *UNUSUAL BRUISING OR BLEEDING *URINARY PROBLEMS (pain or burning when urinating, or frequent urination) *BOWEL PROBLEMS (unusual diarrhea, constipation, pain near the anus) TENDERNESS IN MOUTH AND THROAT WITH OR WITHOUT PRESENCE OF ULCERS (sore throat, sores in mouth, or a toothache) UNUSUAL RASH, SWELLING OR PAIN  UNUSUAL VAGINAL DISCHARGE OR ITCHING   Items with * indicate a potential emergency and should be followed up as soon as possible or go to the Emergency Department if any problems should occur.  Please show the CHEMOTHERAPY ALERT CARD or IMMUNOTHERAPY ALERT CARD at  check-in to the Emergency Department and triage nurse.  Should you have questions after your visit or need to cancel or reschedule your appointment, please contact [facility]  Dept: [phone]  and follow the prompts.  Office hours are 8:00 a.m. to 4:30 p.m. Monday - Friday. Please note that voicemails left after 4:00 p.m. may not be returned until the following business day.  We are closed weekends and major holidays. You have access to a nurse at all times for urgent questions. Please call the main number to the clinic Dept: [phone] and follow the prompts.   For any non-urgent questions, you may also contact your provider using MyChart. We now offer e-Visits for anyone 38 and older to request care online for non-urgent symptoms. For details visit [email or web address].   Also download the MyChart app! Go to the app store, search "MyChart", open the app, select [facility], and log in with your MyChart username and password.  Due to Covid, a mask is required upon entering the hospital/clinic. If you do not have a mask, one will be given to you upon arrival. For doctor visits, patients may have 1 support person aged 14 or older with them. For treatment visits, patients cannot have anyone with them due to current Covid guidelines and our immunocompromised population.

## 2021-03-21 NOTE — Assessment & Plan Note (Signed)
She has multifactorial nausea, likely exacerbated by recent constipation and UTI We discussed importance of hydration and to take antiemetics as needed

## 2021-03-21 NOTE — Assessment & Plan Note (Signed)
She denies recent constipation We discussed importance of laxatives

## 2021-03-21 NOTE — Progress Notes (Signed)
[facility] OFF[facility] PROGRESS NOTE  Patient Care Team: Janora Norlander, DO as PCP - General (Family Medicine) Lorretta Harp, MD as PCP - Cardiology (Cardiology) Irine Seal, MD (Urology) Paula Compton, MD (Obstetrics and Gynecology) Gatha Mayer, MD (Gastroenterology) Gaynelle Arabian, MD as Consulting Physician (Orthopedic Surgery) Ilean China, RN as Registered Nurse  ASSESSMENT & PLAN:  Endometrial cancer [facility]) Treatment was recently complicated by fatigue, UTI and nausea Most of her symptoms has resolved We will proceed with treatment as scheduled I will get nursing staff to call her in 2 days to check on her  Other constipation She denies recent constipation We discussed importance of laxatives  Nausea without vomiting She has multifactorial nausea, likely exacerbated by recent constipation and UTI We discussed importance of hydration and to take antiemetics as needed  No orders of the defined types were placed in this encounter.   All questions were answered. The patient knows to call the clinic with any problems, questions or concerns. The total time spent in the appointment was 20 minutes encounter with patients including review of chart and various tests results, discussions about plan of care and coordination of care plan   Heath Lark, MD 03/21/2021 10:27 AM  INTERVAL HISTORY: Please see below for problem oriented charting. She returns for further follow-up She felt better since recent antibiotics She has occasional nausea but no vomiting She had normal bowel movement this morning She thought that her recent fluid hydration was helpful  SUMMARY OF ONCOLOGIC HISTORY: Oncology History Overview Note  MSI stable, endometrioid grade 2 Neg genetics   Endometrial cancer ([place])  12/19/2020 Initial Diagnosis   Endometrial cancer ([place])   12/26/2020 Imaging   1. Lower uterine segment/cervical mass without evidence of metastatic disease. 2.  Hepatic steatosis. 3. Aortic atherosclerosis (ICD10-I70.0). Coronary artery calcification.   12/29/2020 Pathology Results   FINAL MICROSCOPIC DIAGNOSIS:   A. SENTINEL LYMPH NODE, RIGHT OBTURATOR, EXCISION:  -  No carcinoma identified in one lymph node (0/1)  -  See comment   B. SENTINEL LYMPH NODE, LEFT OBTURATOR, EXCISION:  -  Micrometastasis involving one lymph node (48m/1)  -  See comment   C. UTERUS, CERVIX, BILATERAL TUBES AND OVARIES:  Uterus:  -  Endometrioid carcinoma, FIGO grade 2  -  See oncology table and comment below   Cervix:  -  No carcinoma identified   Bilateral Ovaries:  -  No carcinoma identified   Bilateral Fallopian tubes:  -  No carcinoma identified   ONCOLOGY TABLE:   UTERUS, CARCINOMA OR CARCINOSARCOMA: Resection   Procedure: Total hysterectomy and bilateral salpingo-oophorectomy  Histologic Type: Endometrioid carcinoma  Histologic Grade: FIGO grade 2  Myometrial Invasion:       Percentage of Myometrial Invasion: Estimated to be less than 50%  Uterine Serosa Involvement: Not identified  Cervical stromal Involvement: Not identified  Extent of involvement of other tissue/organs: Not identified  Peritoneal/Ascitic Fluid: Not submitted/unknown  Lymphovascular Invasion: Not identified  Regional Lymph Nodes:       Pelvic Lymph Nodes Examined:  2 Sentinel                                0 non-sentinel                                   2 total  Pelvic Lymph Nodes with Metastasis: 0                           Macrometastasis: (>2.0 mm): 0                          Micrometastasis: (>0.2 mm and < 2.0 mm): 1                            Isolated Tumor Cells (<0.2 mm): 0                       Laterality of Lymph Node with Tumor: 0                             Extracapsular Extension: N/A  Distant Metastasis:       Distant Site(s) Involved: N/A  Pathologic Stage Classification (pTNM, AJCC 8th Edition): pT1a, pN70m  Ancillary Studies: MMR / MSI testing  will be ordered  Representative Tumor Block: C1  Comment(s): Cytokeratin AE1/3 was performed on the lymph nodes (parts a and B) and supports the presence of micrometastasis (part B only). Drs. PSaralyn Pilarand CBaylor University Medical Centerreviewed select parts of the case and agree with the above diagnosis.   (v4.2.0.1)     12/29/2020 Surgery   Surgeon: RDonaciano EvaPre-operative Diagnosis: endometrial cancer grade 3    Operation: Robotic-assisted laparoscopic total hysterectomy with bilateral salpingoophorectomy, SLN biopsy   Surgeon: RDonaciano Eva Specimens: uterus, cervix, bilateral tubes and ovaries, right and left obturator SLN           01/22/2021 Cancer Staging   Staging form: Corpus Uteri - Carcinoma and Carcinosarcoma, AJCC 8th Edition - Pathologic stage from 01/22/2021: Stage IIIC1 (pT1a, pN163m cM0) - Signed by GoHeath LarkMD on 01/22/2021  Stage prefix: Initial diagnosis    01/25/2021 Procedure   Successful placement of a right IJ approach Power Port with ultrasound and fluoroscopic guidance. The catheter is ready for use.   02/07/2021 -  Chemotherapy    Patient is on Treatment Plan: UTERINE CARBOPLATIN AUC 6 / PACLITAXEL Q21D       02/11/2021 Genetic Testing   Negative hereditary cancer genetic testing: no pathogenic variants detected in [facility] Common Hereditary Cancers +RNA Panel.  The report date is February 11, 2021.    The Common Hereditary Cancers + RNA Panel offered by [facility] includes sequencing, deletion/duplication, and RNA testing of the following 47 genes: APC, ATM, AXIN2, BARD1, BMPR1A, BRCA1, BRCA2, BRIP1, CDH1, CDK4*, CDKN2A (p14ARF)*, CDKN2A (p16INK4a)*, CHEK2, CTNNA1, DICER1, EPCAM (Deletion/duplication testing only), GREM1 (promoter region deletion/duplication testing only), KIT, MEN1, MLH1, MSH2, MSH3, MSH6, MUTYH, NBN, NF1, NHTL1, PALB2, PDGFRA*, PMS2, POLD1, POLE, PTEN, RAD50, RAD51C, RAD51D, SDHB, SDHC, SDHD, SMAD4, SMARCA4. STK11, TP53, TSC1, TSC2, and VHL.  The  following genes were evaluated for sequence changes only: SDHA and HOXB13 c.251G>A variant only.  RNA analysis is not performed for the * genes.       REVIEW OF SYSTEMS:   Constitutional: Denies fevers, chills or abnormal weight loss Eyes: Denies blurriness of vision Ears, nose, mouth, throat, and face: Denies mucositis or sore throat Respiratory: Denies cough, dyspnea or wheezes Cardiovascular: Denies palpitation, chest discomfort or lower extremity swelling Skin: Denies abnormal skin rashes Lymphatics: Denies new lymphadenopathy or easy bruising Neurological:Denies numbness, tingling or new  weaknesses Behavioral/Psych: Mood is stable, no new changes  All other systems were reviewed with the patient and are negative.  I have reviewed the past medical history, past surgical history, social history and family history with the patient and they are unchanged from previous note.  ALLERGIES:  is allergic to tramadol.  MEDICATIONS:  Current Outpatient Medications  Medication Sig Dispense Refill   acetaminophen (TYLENOL) 500 MG tablet Take 1,000 mg by mouth as needed (pain.).     albuterol (VENTOLIN HFA) 108 (90 Base) MCG/ACT inhaler Inhale 2 puffs into the lungs every 6 (six) hours as needed for wheezing or shortness of breath. (Patient taking differently: Inhale 2 puffs into the lungs as needed for wheezing or shortness of breath. PRN) 6.7 g 1   budesonide-formoterol (SYMBICORT) 80-4.5 MCG/ACT inhaler Inhale 2 puffs into the lungs 2 (two) times daily. (Patient taking differently: Inhale 2 puffs into the lungs as needed (respiratory issues.).) 4 each 11   Calcium Carbonate-Vit D-Min (CALCIUM 1200 PO) Take 1 tablet by mouth in the morning.     Cholecalciferol (VITAMIN D3) 2000 units TABS Take 2,000 Units by mouth in the morning.     dexamethasone (DECADRON) 4 MG tablet Take 2 tabs at the night before and 2 tab the morning of chemotherapy, every 3 weeks, by mouth x 6 cycles 36 tablet 6    docusate sodium (COLACE) 100 MG capsule Take 100 mg by mouth 2 (two) times daily as needed (constipation.).     DULoxetine (CYMBALTA) 30 MG capsule Take 1 capsule (30 mg total) by mouth daily. (Patient taking differently: Take 30 mg by mouth in the morning.) 90 capsule 3   esomeprazole (NEXIUM) 40 MG capsule Take 1 capsule (40 mg total) by mouth daily before breakfast. 90 capsule 3   furosemide (LASIX) 20 MG tablet Take 1 tablet (20 mg total) by mouth daily as needed for fluid. (Patient taking differently: Take 20 mg by mouth as needed for fluid. PRN) 90 tablet 1   lactulose (CHRONULAC) 10 GM/15ML solution Take 15 mLs (10 g total) by mouth 3 (three) times daily. 473 mL 0   levothyroxine (SYNTHROID) 50 MCG tablet TAKE 1 TABLET BY MOUTH  DAILY BEFORE BREAKFAST (Patient taking differently: Take 50 mcg by mouth daily before breakfast.) 90 tablet 3   lidocaine-prilocaine (EMLA) cream Apply to affected area once 30 g 3   losartan-hydrochlorothiazide (HYZAAR) 100-12.5 MG tablet Take 1 tablet by mouth daily. (Patient taking differently: Take 1 tablet by mouth in the morning.) 90 tablet 3   Omega-3 Fatty Acids (FISH OIL) 1000 MG CAPS Take 1,000 mg by mouth in the morning.     ondansetron (ZOFRAN) 8 MG tablet Take 1 tablet (8 mg total) by mouth 2 (two) times daily as needed. Start on the third day after chemotherapy. 30 tablet 1   oxybutynin (DITROPAN) 5 MG tablet Take 2 tablets (10 mg total) by mouth 2 (two) times daily. 360 tablet 3   prochlorperazine (COMPAZINE) 10 MG tablet Take 1 tablet (10 mg total) by mouth every 6 (six) hours as needed (Nausea or vomiting). 30 tablet 1   valACYclovir (VALTREX) 1000 MG tablet Take 1 tablet twice daily for 7 days.  If you have another flare, take 2 tablets twice a day for ONE day only per flareup. (Patient taking differently: Take 500 mg by mouth 2 (two) times daily as needed (fever blisters). PRN) 30 tablet 0   vitamin B-12 (CYANOCOBALAMIN) 1000 MCG tablet Take 1,000 mcg  by mouth  in the morning.     No current facility-administered medications for this visit.   Facility-Administered Medications Ordered in Other Visits  Medication Dose Route Frequency Provider Last Rate Last Admin   CARBOplatin (PARAPLATIN) 640 mg in sodium chloride 0.9 % 250 mL chemo infusion  640 mg Intravenous Once Alvy Bimler, Ladashia Demarinis, MD       dexamethasone (DECADRON) 10 mg in sodium chloride 0.9 % 50 mL IVPB  10 mg Intravenous Once Alvy Bimler, Kamarrion Stfort, MD       famotidine (PEPCID) IVPB 20 mg in NS 100 mL IVPB  20 mg Intravenous Once Alvy Bimler, Arlene Genova, MD 400 mL/hr at 03/21/21 1020 20 mg at 03/21/21 1020   fosaprepitant (EMEND) 150 mg in sodium chloride 0.9 % 145 mL IVPB  150 mg Intravenous Once Alvy Bimler, Nadalyn Deringer, MD       heparin lock flush 100 unit/mL  500 Units Intracatheter Once PRN Alvy Bimler, Dariel Pellecchia, MD       PACLitaxel (TAXOL) 270 mg in sodium chloride 0.9 % 250 mL chemo infusion (> 22m/m2)  131.25 mg/m2 (Treatment Plan Recorded) Intravenous Once GAlvy Bimler Andrea Ferrer, MD       sodium chloride flush (NS) 0.9 % injection 10 mL  10 mL Intracatheter PRN GAlvy Bimler Leinani Lisbon, MD        PHYSICAL EXAMINATION: ECOG PERFORMANCE STATUS: 1 - Symptomatic but completely ambulatory  Vitals:   03/21/21 0905  BP: 108/78  Pulse: 94  Resp: 18  Temp: (!) 97 F (36.1 C)  SpO2: 99%   Filed Weights   03/21/21 0905  Weight: 225 lb 6.4 oz (102.2 kg)    GENERAL:alert, no distress and comfortable SKIN: skin color, texture, turgor are normal, no rashes or significant lesions EYES: normal, Conjunctiva are pink and non-injected, sclera clear OROPHARYNX:no exudate, no erythema and lips, buccal mucosa, and tongue normal  NECK: supple, thyroid normal size, non-tender, without nodularity LYMPH:  no palpable lymphadenopathy in the cervical, axillary or inguinal LUNGS: clear to auscultation and percussion with normal breathing effort HEART: regular rate & rhythm and no murmurs and no lower extremity edema ABDOMEN:abdomen soft, non-tender and normal  bowel sounds Musculoskeletal:no cyanosis of digits and no clubbing  NEURO: alert & oriented x 3 with fluent speech, no focal motor/sensory deficits  LABORATORY DATA:  I have reviewed the data as listed    Component Value Date/Time   NA 141 03/21/2021 0853   NA 141 08/31/2020 1149   K 4.5 03/21/2021 0853   CL 107 03/21/2021 0853   CO2 27 03/21/2021 0853   GLUCOSE 111 (H) 03/21/2021 0853   BUN 16 03/21/2021 0853   BUN 15 08/31/2020 1149   CREATININE 0.73 03/21/2021 0853   CREATININE 0.60 11/03/2012 1228   CALCIUM 10.0 03/21/2021 0853   PROT 6.9 03/21/2021 0853   PROT 7.3 08/31/2020 1149   ALBUMIN 3.6 03/21/2021 0853   ALBUMIN 4.7 08/31/2020 1149   AST 13 (L) 03/21/2021 0853   ALT 19 03/21/2021 0853   ALKPHOS 89 03/21/2021 0853   BILITOT 0.3 03/21/2021 0853   GFRNONAA >60 03/21/2021 0853   GFRAA 91 08/31/2020 1149    No results found for: SPEP, UPEP  Lab Results  Component Value Date   WBC 5.9 03/21/2021   NEUTROABS 3.7 03/21/2021   HGB 12.4 03/21/2021   HCT 37.0 03/21/2021   MCV 92.5 03/21/2021   PLT 157 03/21/2021      Chemistry      Component Value Date/Time   NA 141 03/21/2021 0853   NA 141 08/31/2020 1149  K 4.5 03/21/2021 0853   CL 107 03/21/2021 0853   CO2 27 03/21/2021 0853   BUN 16 03/21/2021 0853   BUN 15 08/31/2020 1149   CREATININE 0.73 03/21/2021 0853   CREATININE 0.60 11/03/2012 1228   GLU 81 07/24/2012 0000      Component Value Date/Time   CALCIUM 10.0 03/21/2021 0853   ALKPHOS 89 03/21/2021 0853   AST 13 (L) 03/21/2021 0853   ALT 19 03/21/2021 0853   BILITOT 0.3 03/21/2021 0853

## 2021-03-21 NOTE — Assessment & Plan Note (Signed)
Treatment was recently complicated by fatigue, UTI and nausea Most of her symptoms has resolved We will proceed with treatment as scheduled I will get nursing staff to call her in 2 days to check on her

## 2021-03-22 ENCOUNTER — Encounter: Payer: Self-pay | Admitting: Family Medicine

## 2021-03-22 ENCOUNTER — Ambulatory Visit (INDEPENDENT_AMBULATORY_CARE_PROVIDER_SITE_OTHER): Payer: Medicare Other | Admitting: Family Medicine

## 2021-03-22 VITALS — BP 145/85 | HR 84 | Temp 97.2°F | Ht 61.0 in | Wt 225.2 lb

## 2021-03-22 DIAGNOSIS — K5909 Other constipation: Secondary | ICD-10-CM | POA: Diagnosis not present

## 2021-03-22 DIAGNOSIS — C541 Malignant neoplasm of endometrium: Secondary | ICD-10-CM | POA: Diagnosis not present

## 2021-03-22 DIAGNOSIS — R5383 Other fatigue: Secondary | ICD-10-CM

## 2021-03-22 DIAGNOSIS — E039 Hypothyroidism, unspecified: Secondary | ICD-10-CM

## 2021-03-22 MED ORDER — TRULANCE 3 MG PO TABS: 1.0000 | ORAL_TABLET | Freq: Every day | ORAL | 0 refills | Status: DC

## 2021-03-22 NOTE — Patient Instructions (Signed)
Your labs were obtained and they thyroid function was normal.

## 2021-03-22 NOTE — Progress Notes (Signed)
Subjective: CC: Constipation PCP: Janora Norlander, DO CL:[id number] Stacie Russell is a 70 y.o. female presenting to clinic today for:  1.  Endometrial cancer/ constipation/ fatigue Patient is currently under the treatment of oncology for endometrial cancer.  She has 3 more chemotherapy sessions to go.  She admits to some fatigue.  She has had issues with fatigue in the past but it had resolved previously.  She really searching for energy and notes that she tuckers out fairly quickly.  When she does have energy her husband is worried about her moving around too much and therefore her activity has been limited.  She is also been experiencing some constipation.  This has been fairly refractory to lactulose, MiraLAX, Colace.  She is tried Linzess in the past but cannot recall if she really responded to that.  She would certainly like to try something else as her bowel movements are strained and small.  No blood reported in stool  2.  Acquired hypothyroidism Patient is compliant with her Synthroid.  She had thyroid labs performed yesterday that were normal.  She reports chronic constipation and fatigue as above but otherwise has been asymptomatic.   ROS: Per HPI  Allergies  Allergen Reactions   Tramadol Other (See Comments)    Unsure of reaction   Past Medical History:  Diagnosis Date   Arthritis    Asthma    Diverticulosis    Dyspnea    with exertion    Endometrial hyperplasia    Esophageal stricture    Family history of breast cancer 01/26/2021   Foot pain    Fundic gland polyps of stomach, benign 07/10/2015   Gastritis    GERD (gastroesophageal reflux disease)    Hemorrhoid    Hiatal hernia    Hip pain    History of kidney stones    cystoscopy   History of surgery on arm    left humerus   Hyperlipidemia    Hypertension    Hypothyroidism    Knee pain    Metabolic syndrome    Obstructive sleep apnea 08/27/2017   pt denies    Oxygen deficiency    pt denies   PONV  (postoperative nausea and vomiting)    Spinal headache    few headaches recently    Tendonitis of ankle    Vitamin D deficiency     Current Outpatient Medications:    acetaminophen (TYLENOL) 500 MG tablet, Take 1,000 mg by mouth as needed (pain.)., Disp: , Rfl:    albuterol (VENTOLIN HFA) 108 (90 Base) MCG/ACT inhaler, Inhale 2 puffs into the lungs every 6 (six) hours as needed for wheezing or shortness of breath. (Patient taking differently: Inhale 2 puffs into the lungs as needed for wheezing or shortness of breath. PRN), Disp: 6.7 g, Rfl: 1   budesonide-formoterol (SYMBICORT) 80-4.5 MCG/ACT inhaler, Inhale 2 puffs into the lungs 2 (two) times daily. (Patient taking differently: Inhale 2 puffs into the lungs as needed (respiratory issues.).), Disp: 4 each, Rfl: 11   Calcium Carbonate-Vit D-Min (CALCIUM 1200 PO), Take 1 tablet by mouth in the morning., Disp: , Rfl:    Cholecalciferol (VITAMIN D3) 2000 units TABS, Take 2,000 Units by mouth in the morning., Disp: , Rfl:    dexamethasone (DECADRON) 4 MG tablet, Take 2 tabs at the night before and 2 tab the morning of chemotherapy, every 3 weeks, by mouth x 6 cycles, Disp: 36 tablet, Rfl: 6   docusate sodium (COLACE) 100 MG capsule, Take 100  mg by mouth 2 (two) times daily as needed (constipation.)., Disp: , Rfl:    DULoxetine (CYMBALTA) 30 MG capsule, Take 1 capsule (30 mg total) by mouth daily. (Patient taking differently: Take 30 mg by mouth in the morning.), Disp: 90 capsule, Rfl: 3   esomeprazole (NEXIUM) 40 MG capsule, Take 1 capsule (40 mg total) by mouth daily before breakfast., Disp: 90 capsule, Rfl: 3   furosemide (LASIX) 20 MG tablet, Take 1 tablet (20 mg total) by mouth daily as needed for fluid. (Patient taking differently: Take 20 mg by mouth as needed for fluid. PRN), Disp: 90 tablet, Rfl: 1   lactulose (CHRONULAC) 10 GM/15ML solution, Take 15 mLs (10 g total) by mouth 3 (three) times daily., Disp: 473 mL, Rfl: 0   levothyroxine  (SYNTHROID) 50 MCG tablet, TAKE 1 TABLET BY MOUTH  DAILY BEFORE BREAKFAST (Patient taking differently: Take 50 mcg by mouth daily before breakfast.), Disp: 90 tablet, Rfl: 3   lidocaine-prilocaine (EMLA) cream, Apply to affected area once, Disp: 30 g, Rfl: 3   losartan-hydrochlorothiazide (HYZAAR) 100-12.5 MG tablet, Take 1 tablet by mouth daily. (Patient taking differently: Take 1 tablet by mouth in the morning.), Disp: 90 tablet, Rfl: 3   Omega-3 Fatty Acids (FISH OIL) 1000 MG CAPS, Take 1,000 mg by mouth in the morning., Disp: , Rfl:    ondansetron (ZOFRAN) 8 MG tablet, Take 1 tablet (8 mg total) by mouth 2 (two) times daily as needed. Start on the third day after chemotherapy., Disp: 30 tablet, Rfl: 1   oxybutynin (DITROPAN) 5 MG tablet, Take 2 tablets (10 mg total) by mouth 2 (two) times daily., Disp: 360 tablet, Rfl: 3   Plecanatide (TRULANCE) 3 MG TABS, Take 1 tablet by mouth daily., Disp: 8 tablet, Rfl: 0   prochlorperazine (COMPAZINE) 10 MG tablet, Take 1 tablet (10 mg total) by mouth every 6 (six) hours as needed (Nausea or vomiting)., Disp: 30 tablet, Rfl: 1   valACYclovir (VALTREX) 1000 MG tablet, Take 1 tablet twice daily for 7 days.  If you have another flare, take 2 tablets twice a day for ONE day only per flareup. (Patient taking differently: Take 500 mg by mouth 2 (two) times daily as needed (fever blisters). PRN), Disp: 30 tablet, Rfl: 0   vitamin B-12 (CYANOCOBALAMIN) 1000 MCG tablet, Take 1,000 mcg by mouth in the morning., Disp: , Rfl:  Social History   Socioeconomic History   Marital status: Married    Spouse name: Marcello Moores   Number of children: 2   Years of education: 14   Highest education level: Associate degree: academic program  Occupational History   Occupation: Retired  Tobacco Use   Smoking status: Never   Smokeless tobacco: Never  Scientific laboratory technician Use: Never used  Substance and Sexual Activity   Alcohol use: No   Drug use: No   Sexual activity: Not  Currently    Birth control/protection: Post-menopausal  Other Topics Concern   Not on file  Social History Narrative   Married and retired   No EtOH, tobacco or drug use   Social Determinants of Radio broadcast assistant Strain: Low Risk    Difficulty of Paying Living Expenses: Not hard at all  Food Insecurity: No Food Insecurity   Worried About Charity fundraiser in the Last Year: Never true   Arboriculturist in the Last Year: Never true  Transportation Needs: No Transportation Needs   Lack of Transportation (Medical): No  Lack of Transportation (Non-Medical): No  Physical Activity: Not on file  Stress: Not on file  Social Connections: Socially Integrated   Frequency of Communication with Friends and Family: More than three times a week   Frequency of Social Gatherings with Friends and Family: More than three times a week   Attends Religious Services: More than 4 times per year   Active Member of [employer] or Organizations: Yes   Attends Music therapist: More than 4 times per year   Marital Status: Married  Human resources officer Violence: Not on file   Family History  Problem Relation Age of Onset   Colon cancer Mother 44   Heart attack Father    Heart disease Sister    Breast cancer Sister 16   Breast cancer Sister        dx 91s   Cancer Maternal Aunt 58       "female cancer", unknown type   Cancer Maternal Grandmother        unknown type; dx 50s   Cancer Maternal Grandfather        unknown type; dx 44s   Cancer Cousin        maternal female cousin; dx 55s; unknown type   Cancer Cousin        maternal female cousin; dx 40s; unknown type   Cancer Cousin        maternal female cousin; dx after 60; unknown type   Esophageal cancer Neg Hx    Stomach cancer Neg Hx    Rectal cancer Neg Hx     Objective: Office vital signs reviewed. BP (!) 145/85   Pulse 84   Temp (!) 97.2 F (36.2 C)   Ht '5\' 1"'$  (1.549 m)   Wt 225 lb 3.2 oz (102.2 kg)   SpO2 96%   BMI  42.55 kg/m   Physical Examination:  General: Awake, alert, chronically ill-appearing, No acute distress HEENT: Verrucous growth noted along the temporal aspect of her scalp; sclera white.  No exophthalmos Cardio: regular rate and rhythm, S1S2 heard, no murmurs appreciated Pulm: clear to auscultation bilaterally, no wheezes, rhonchi or rales; normal work of breathing on room air Neuro: No tremor Psych: Tired appearing but mood is stable  Depression screen [facility] 2/9 03/22/2021 08/31/2020 03/01/2020  Decreased Interest 2 0 0  Down, Depressed, Hopeless 1 0 0  PHQ - 2 Score 3 0 0  Altered sleeping 1 0 -  Tired, decreased energy 3 0 -  Change in appetite 2 0 -  Feeling bad or failure about yourself  0 0 -  Trouble concentrating 1 0 -  Moving slowly or fidgety/restless 1 0 -  Suicidal thoughts 0 0 -  PHQ-9 Score 11 0 -  Difficult doing work/chores Very difficult - -  Some recent data might be hidden   GAD 7 : Generalized Anxiety Score 03/22/2021  Nervous, Anxious, on Edge 2  Control/stop worrying 1  Worry too much - different things 1  Trouble relaxing 1  Restless 0  Easily annoyed or irritable 0  Afraid - awful might happen 0  Total GAD 7 Score 5  Anxiety Difficulty Very difficult    Assessment/ Plan: 70 y.o. female   Other constipation - Plan: Plecanatide (TRULANCE) 3 MG TABS  Endometrial cancer (HCC)  Other fatigue  Acquired hypothyroidism  Trial of Trulance for constipation.  Her symptoms have thus far been refractory to Linzess, lactulose, Colace and MiraLAX.  She will let me know if the  Trulance is helpful and we will prescribe accordingly  She is currently under treatment with chemotherapy by Dr. Alvy Bimler for endometrial cancer.  Unfortunately she is experiencing quite a bit of fatigue.  She is had issues with fatigue in the past but I do feel that this particular degree of fatigue is likely secondary to her treatments for cancer.  We discussed trying to stay active within  reason to promote physical conditioning as well as consume a balanced diet with adequate protein.  She is already working on low sugar intake due to neoplasm.  I reviewed her thyroid labs which were within normal range.  Would like to see her back in roughly 4 months just for recheck.  She understandably is having some situational depression and anxiety with relation to her health.  I am going to have virtual behavioral health reach out to her to see if we can give any additional support.  Hesitate to put her on substantially more medications than she already is taking.  However, we could consider advancing the Cymbalta if she desired.  No orders of the defined types were placed in this encounter.  Meds ordered this encounter  Medications   Plecanatide (TRULANCE) 3 MG TABS    Sig: Take 1 tablet by mouth daily.    Dispense:  8 tablet    Refill:  0     Carsen Leaf Windell Moulding, DO [place] [phone]

## 2021-03-23 ENCOUNTER — Other Ambulatory Visit: Payer: Self-pay | Admitting: Hematology and Oncology

## 2021-03-23 ENCOUNTER — Telehealth: Payer: Self-pay

## 2021-03-23 NOTE — Telephone Encounter (Signed)
Called back and given a 2 pm appt for tomorrow 8/12 for IV fluids. She verbalized understanding to appt.

## 2021-03-23 NOTE — Telephone Encounter (Signed)
Orders are in

## 2021-03-23 NOTE — Telephone Encounter (Signed)
Called and given below message. She verbalized understanding. She is feeling weak today and would like to get IV fluids tomorrow. She denies constipation and last bm yesterday.  Sent a message to charge nurse asking to add IV fluids for tomorrow.

## 2021-03-23 NOTE — Telephone Encounter (Signed)
-----   Message from Heath Lark, MD sent at 03/23/2021  9:05 AM EDT ----- Pls call her around lunhc time and check on her, see if she needs IVF, and if she had BM

## 2021-03-24 ENCOUNTER — Inpatient Hospital Stay: Payer: Medicare Other

## 2021-03-24 ENCOUNTER — Other Ambulatory Visit: Payer: Self-pay

## 2021-03-24 VITALS — BP 148/73 | HR 83 | Temp 97.8°F | Resp 16

## 2021-03-24 DIAGNOSIS — Z5111 Encounter for antineoplastic chemotherapy: Secondary | ICD-10-CM | POA: Diagnosis not present

## 2021-03-24 DIAGNOSIS — G5600 Carpal tunnel syndrome, unspecified upper limb: Secondary | ICD-10-CM | POA: Diagnosis not present

## 2021-03-24 DIAGNOSIS — G629 Polyneuropathy, unspecified: Secondary | ICD-10-CM | POA: Diagnosis not present

## 2021-03-24 DIAGNOSIS — Z79899 Other long term (current) drug therapy: Secondary | ICD-10-CM | POA: Diagnosis not present

## 2021-03-24 DIAGNOSIS — R110 Nausea: Secondary | ICD-10-CM | POA: Diagnosis not present

## 2021-03-24 DIAGNOSIS — I700 Atherosclerosis of aorta: Secondary | ICD-10-CM | POA: Diagnosis not present

## 2021-03-24 DIAGNOSIS — K5900 Constipation, unspecified: Secondary | ICD-10-CM | POA: Diagnosis not present

## 2021-03-24 DIAGNOSIS — C541 Malignant neoplasm of endometrium: Secondary | ICD-10-CM

## 2021-03-24 MED ORDER — SODIUM CHLORIDE 0.9 % IV SOLN
Freq: Once | INTRAVENOUS | Status: AC
  Filled 2021-03-24: qty 250

## 2021-03-24 MED ORDER — SODIUM CHLORIDE 0.9% FLUSH
10.0000 mL | Freq: Once | INTRAVENOUS | Status: AC | PRN
  Administered 2021-03-24: 10 mL
  Filled 2021-03-24: qty 10

## 2021-03-24 MED ORDER — HEPARIN SOD (PORK) LOCK FLUSH 100 UNIT/ML IV SOLN
500.0000 [IU] | Freq: Once | INTRAVENOUS | Status: AC | PRN
Start: 2021-03-24 — End: 2021-03-24
  Administered 2021-03-24: 500 [IU]
  Filled 2021-03-24: qty 5

## 2021-03-24 NOTE — Patient Instructions (Signed)

## 2021-03-30 ENCOUNTER — Telehealth: Payer: Self-pay | Admitting: Hematology and Oncology

## 2021-03-30 ENCOUNTER — Telehealth: Payer: Self-pay

## 2021-03-30 NOTE — Telephone Encounter (Signed)
Scheduled appt per 8/18 sch msg. Called pt, no answer. Left msg with appt date and time.  

## 2021-03-30 NOTE — Telephone Encounter (Signed)
Is she ok now? Please send scheduling msg to add IVF

## 2021-03-30 NOTE — Telephone Encounter (Signed)
She called and left a message requesting IV fluids on 9/1 after treatment on 8/29. She said the IV fluids really help after treatment.

## 2021-03-30 NOTE — Telephone Encounter (Signed)
Called back and left a message. Ask her to call the office for questions. Scheduling message sent for IV fluids on 9/1 am.

## 2021-04-04 ENCOUNTER — Telehealth: Payer: Self-pay | Admitting: Emergency Medicine

## 2021-04-04 NOTE — Telephone Encounter (Signed)
A Randomized pragmatic Chair-Based Home Exercise Intervention for Mitigating Cancer-Related Fatigue in Older Adults Undergoing Chemotherapy for Advanced Disease  04/04/21  10:35am: Called patient to schedule next research visit for this study.  Scheduled week 6 visit for Monday 04/10/21 at 9:30am while patient is at the clinic for already scheduled appointments.  [name] Clinical Research Coordinator I  04/04/21  10:45 AM

## 2021-04-10 ENCOUNTER — Inpatient Hospital Stay: Payer: Medicare Other

## 2021-04-10 ENCOUNTER — Other Ambulatory Visit: Payer: Self-pay

## 2021-04-10 ENCOUNTER — Encounter: Payer: Self-pay | Admitting: Hematology and Oncology

## 2021-04-10 ENCOUNTER — Inpatient Hospital Stay: Payer: Medicare Other | Admitting: Hematology and Oncology

## 2021-04-10 ENCOUNTER — Inpatient Hospital Stay: Payer: Medicare Other | Admitting: Emergency Medicine

## 2021-04-10 VITALS — HR 81

## 2021-04-10 DIAGNOSIS — R110 Nausea: Secondary | ICD-10-CM

## 2021-04-10 DIAGNOSIS — Z5111 Encounter for antineoplastic chemotherapy: Secondary | ICD-10-CM | POA: Diagnosis not present

## 2021-04-10 DIAGNOSIS — G629 Polyneuropathy, unspecified: Secondary | ICD-10-CM | POA: Diagnosis not present

## 2021-04-10 DIAGNOSIS — C541 Malignant neoplasm of endometrium: Secondary | ICD-10-CM

## 2021-04-10 DIAGNOSIS — K5900 Constipation, unspecified: Secondary | ICD-10-CM | POA: Diagnosis not present

## 2021-04-10 DIAGNOSIS — I700 Atherosclerosis of aorta: Secondary | ICD-10-CM | POA: Diagnosis not present

## 2021-04-10 DIAGNOSIS — K5909 Other constipation: Secondary | ICD-10-CM | POA: Diagnosis not present

## 2021-04-10 DIAGNOSIS — G5600 Carpal tunnel syndrome, unspecified upper limb: Secondary | ICD-10-CM | POA: Diagnosis not present

## 2021-04-10 DIAGNOSIS — Z79899 Other long term (current) drug therapy: Secondary | ICD-10-CM | POA: Diagnosis not present

## 2021-04-10 LAB — CMP (CANCER CENTER ONLY)
ALT: 21 U/L (ref 0–44)
AST: 15 U/L (ref 15–41)
Albumin: 4 g/dL (ref 3.5–5.0)
Alkaline Phosphatase: 102 U/L (ref 38–126)
Anion gap: 11 (ref 5–15)
BUN: 13 mg/dL (ref 8–23)
CO2: 22 mmol/L (ref 22–32)
Calcium: 9.7 mg/dL (ref 8.9–10.3)
Chloride: 107 mmol/L (ref 98–111)
Creatinine: 0.77 mg/dL (ref 0.44–1.00)
GFR, Estimated: >60 mL/min (ref >60)
Glucose, Bld: 190 mg/dL — ABNORMAL HIGH (ref 70–99)
Potassium: 4.3 mmol/L (ref 3.5–5.1)
Sodium: 140 mmol/L (ref 135–145)
Total Bilirubin: 0.4 mg/dL (ref 0.3–1.2)
Total Protein: 7.4 g/dL (ref 6.5–8.1)

## 2021-04-10 LAB — CBC WITH DIFFERENTIAL (CANCER CENTER ONLY)
Abs Immature Granulocytes: 0.01 10*3/uL (ref 0.00–0.07)
Basophils Absolute: 0 10*3/uL (ref 0.0–0.1)
Basophils Relative: 0 %
Eosinophils Absolute: 0 10*3/uL (ref 0.0–0.5)
Eosinophils Relative: 0 %
HCT: 36.7 % (ref 36.0–46.0)
Hemoglobin: 12.5 g/dL (ref 12.0–15.0)
Immature Granulocytes: 0 %
Lymphocytes Relative: 12 %
Lymphs Abs: 0.5 10*3/uL — ABNORMAL LOW (ref 0.7–4.0)
MCH: 31.3 pg (ref 26.0–34.0)
MCHC: 34.1 g/dL (ref 30.0–36.0)
MCV: 92 fL (ref 80.0–100.0)
Monocytes Absolute: 0 10*3/uL — ABNORMAL LOW (ref 0.1–1.0)
Monocytes Relative: 1 %
Neutro Abs: 3.7 10*3/uL (ref 1.7–7.7)
Neutrophils Relative %: 87 %
Platelet Count: 190 10*3/uL (ref 150–400)
RBC: 3.99 MIL/uL (ref 3.87–5.11)
RDW: 15.2 % (ref 11.5–15.5)
WBC Count: 4.2 10*3/uL (ref 4.0–10.5)
nRBC: 0 % (ref 0.0–0.2)

## 2021-04-10 MED ORDER — FAMOTIDINE 20 MG IN NS 100 ML IVPB
20.0000 mg | Freq: Once | INTRAVENOUS | Status: AC
  Administered 2021-04-10: 20 mg via INTRAVENOUS
  Filled 2021-04-10: qty 100

## 2021-04-10 MED ORDER — DIPHENHYDRAMINE HCL 50 MG/ML IJ SOLN
25.0000 mg | Freq: Once | INTRAMUSCULAR | Status: AC
  Administered 2021-04-10: 25 mg via INTRAVENOUS
  Filled 2021-04-10: qty 1

## 2021-04-10 MED ORDER — PALONOSETRON HCL INJECTION 0.25 MG/5ML
0.2500 mg | Freq: Once | INTRAVENOUS | Status: AC
  Administered 2021-04-10: 0.25 mg via INTRAVENOUS
  Filled 2021-04-10: qty 5

## 2021-04-10 MED ORDER — SODIUM CHLORIDE 0.9 % IV SOLN
150.0000 mg | Freq: Once | INTRAVENOUS | Status: AC
  Administered 2021-04-10: 150 mg via INTRAVENOUS
  Filled 2021-04-10: qty 150

## 2021-04-10 MED ORDER — HEPARIN SOD (PORK) LOCK FLUSH 100 UNIT/ML IV SOLN
500.0000 [IU] | Freq: Once | INTRAVENOUS | Status: AC | PRN
  Administered 2021-04-10: 500 [IU]

## 2021-04-10 MED ORDER — SODIUM CHLORIDE 0.9% FLUSH
10.0000 mL | Freq: Once | INTRAVENOUS | Status: AC
  Administered 2021-04-10: 10 mL

## 2021-04-10 MED ORDER — SODIUM CHLORIDE 0.9 % IV SOLN
10.0000 mg | Freq: Once | INTRAVENOUS | Status: AC
  Administered 2021-04-10: 10 mg via INTRAVENOUS
  Filled 2021-04-10: qty 10

## 2021-04-10 MED ORDER — SODIUM CHLORIDE 0.9 % IV SOLN
131.2500 mg/m2 | Freq: Once | INTRAVENOUS | Status: AC
  Administered 2021-04-10: 270 mg via INTRAVENOUS
  Filled 2021-04-10: qty 45

## 2021-04-10 MED ORDER — SODIUM CHLORIDE 0.9 % IV SOLN
640.0000 mg | Freq: Once | INTRAVENOUS | Status: AC
  Administered 2021-04-10: 640 mg via INTRAVENOUS
  Filled 2021-04-10: qty 64

## 2021-04-10 MED ORDER — SODIUM CHLORIDE 0.9 % IV SOLN: Freq: Once | INTRAVENOUS | Status: AC

## 2021-04-10 MED ORDER — SODIUM CHLORIDE 0.9% FLUSH
10.0000 mL | INTRAVENOUS | Status: DC | PRN
  Administered 2021-04-10: 10 mL

## 2021-04-10 NOTE — Assessment & Plan Note (Signed)
She felt the IV fluids after treatment was helpful We will schedule IV fluid support with each cycle of treatment

## 2021-04-10 NOTE — Assessment & Plan Note (Signed)
She tolerated last cycle of therapy better with aggressive IV fluid support and laxative She had minimum side effects from treatment so far We will proceed with treatment as scheduled I will try to schedule IV fluids 2 days after treatment

## 2021-04-10 NOTE — Research (Signed)
A Randomized pragmatic Chair-Based Home Exercise Intervention for Mitigating Cancer-Related Fatigue in Older Adults Undergoing Chemotherapy for Advanced Disease  04/10/21 - Week 6 Visit  PROs:  Week 6 PROs including PROMIS-F and CES-D were completed by the patient in the clinic today and checked for completeness by this clinical research coordinator.  SPPB:  The SPPB including balance test, gait speed test, and chair stand and repeated chair stand was performed by this coordinator per protocol.  Gift Card:  The patient was given a $25 [employer] gift card for completion of the required week 6 assessments.  The patient was thanked for her time participation in this study.  She is aware there will be one more research visit at 12 weeks.  Patient was advised to call with any questions or concerns.   [name] Clinical Research Coordinator I  04/10/21 9:20 AM

## 2021-04-10 NOTE — Assessment & Plan Note (Signed)
She continues to battle significant constipation with each cycle of therapy She will continue aggressive laxative therapy with each treatment

## 2021-04-10 NOTE — Progress Notes (Signed)
[place] OFFICE PROGRESS NOTE  Patient Care Team: Janora Norlander, DO as PCP - General (Family Medicine) Lorretta Harp, MD as PCP - Cardiology (Cardiology) Irine Seal, MD (Urology) Paula Compton, MD (Obstetrics and Gynecology) Gatha Mayer, MD (Gastroenterology) Gaynelle Arabian, MD as Consulting Physician (Orthopedic Surgery)  ASSESSMENT & PLAN:  Endometrial cancer [facility]) She tolerated last cycle of therapy better with aggressive IV fluid support and laxative She had minimum side effects from treatment so far We will proceed with treatment as scheduled I will try to schedule IV fluids 2 days after treatment  Other constipation She continues to battle significant constipation with each cycle of therapy She will continue aggressive laxative therapy with each treatment  Nausea without vomiting She felt the IV fluids after treatment was helpful We will schedule IV fluid support with each cycle of treatment  No orders of the defined types were placed in this encounter.   All questions were answered. The patient knows to call the clinic with any problems, questions or concerns. The total time spent in the appointment was 20 minutes encounter with patients including review of chart and various tests results, discussions about plan of care and coordination of care plan   Heath Lark, MD 04/10/2021 8:33 AM  INTERVAL HISTORY: Please see below for problem oriented charting. 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 but not in the feet She had prescription laxatives by her primary care doctor that worked well for her to avoid constipation with recent treatment  REVIEW OF SYSTEMS:   Constitutional: Denies  fevers, chills or abnormal weight loss Eyes: Denies blurriness of vision Ears, nose, mouth, throat, and face: Denies mucositis or sore throat Cardiovascular: Denies palpitation, chest discomfort or lower extremity swelling Skin: Denies abnormal skin rashes Lymphatics: Denies new lymphadenopathy or easy bruising Behavioral/Psych: Mood is stable, no new changes  All other systems were reviewed with the patient and are negative.  I have reviewed the past medical history, past surgical history, social history and family history with the patient and they are unchanged from previous note.  ALLERGIES:  is allergic to tramadol.  MEDICATIONS:  Current Outpatient Medications  Medication Sig Dispense Refill   acetaminophen (TYLENOL) 500 MG tablet Take 1,000 mg by mouth as needed (pain.).     albuterol (VENTOLIN HFA) 108 (90 Base) MCG/ACT inhaler Inhale 2 puffs into the lungs every 6 (six) hours as needed for wheezing or shortness of breath. (Patient taking differently: Inhale 2 puffs into the lungs as needed for wheezing or shortness of breath. PRN) 6.7 g 1   budesonide-formoterol (SYMBICORT) 80-4.5 MCG/ACT inhaler Inhale 2 puffs into the lungs 2 (two) times daily. (Patient taking differently: Inhale 2 puffs into the lungs as needed (respiratory issues.).) 4 each 11   Calcium Carbonate-Vit D-Min (CALCIUM 1200 PO) Take 1 tablet by mouth in the morning.     Cholecalciferol (VITAMIN D3) 2000 units TABS Take 2,000 Units by mouth in the morning.     dexamethasone (DECADRON) 4 MG tablet Take 2 tabs at the night before and 2 tab the morning of chemotherapy, every 3 weeks, by mouth x 6 cycles 36 tablet 6   docusate sodium (COLACE) 100 MG capsule Take 100 mg by mouth 2 (two) times  daily as needed (constipation.).     DULoxetine (CYMBALTA) 30 MG capsule Take 1 capsule (30 mg total) by mouth daily. (Patient taking differently: Take 30 mg by mouth in the morning.) 90 capsule 3   esomeprazole (NEXIUM) 40 MG capsule  Take 1 capsule (40 mg total) by mouth daily before breakfast. 90 capsule 3   furosemide (LASIX) 20 MG tablet Take 1 tablet (20 mg total) by mouth daily as needed for fluid. (Patient taking differently: Take 20 mg by mouth as needed for fluid. PRN) 90 tablet 1   lactulose (CHRONULAC) 10 GM/15ML solution Take 15 mLs (10 g total) by mouth 3 (three) times daily. 473 mL 0   levothyroxine (SYNTHROID) 50 MCG tablet TAKE 1 TABLET BY MOUTH  DAILY BEFORE BREAKFAST (Patient taking differently: Take 50 mcg by mouth daily before breakfast.) 90 tablet 3   lidocaine-prilocaine (EMLA) cream Apply to affected area once 30 g 3   losartan-hydrochlorothiazide (HYZAAR) 100-12.5 MG tablet Take 1 tablet by mouth daily. (Patient taking differently: Take 1 tablet by mouth in the morning.) 90 tablet 3   Omega-3 Fatty Acids (FISH OIL) 1000 MG CAPS Take 1,000 mg by mouth in the morning.     ondansetron (ZOFRAN) 8 MG tablet Take 1 tablet (8 mg total) by mouth 2 (two) times daily as needed. Start on the third day after chemotherapy. 30 tablet 1   oxybutynin (DITROPAN) 5 MG tablet Take 2 tablets (10 mg total) by mouth 2 (two) times daily. 360 tablet 3   Plecanatide (TRULANCE) 3 MG TABS Take 1 tablet by mouth daily. 8 tablet 0   prochlorperazine (COMPAZINE) 10 MG tablet Take 1 tablet (10 mg total) by mouth every 6 (six) hours as needed (Nausea or vomiting). 30 tablet 1   valACYclovir (VALTREX) 1000 MG tablet Take 1 tablet twice daily for 7 days.  If you have another flare, take 2 tablets twice a day for ONE day only per flareup. (Patient taking differently: Take 500 mg by mouth 2 (two) times daily as needed (fever blisters). PRN) 30 tablet 0   vitamin B-12 (CYANOCOBALAMIN) 1000 MCG tablet Take 1,000 mcg by mouth in the morning.     No current facility-administered medications for this visit.    SUMMARY OF ONCOLOGIC HISTORY: Oncology History Overview Note  MSI stable, endometrioid grade 2 Neg genetics   Endometrial cancer  ([place])  12/19/2020 Initial Diagnosis   Endometrial cancer ([place])   12/26/2020 Imaging   1. Lower uterine segment/cervical mass without evidence of metastatic disease. 2. Hepatic steatosis. 3. Aortic atherosclerosis (ICD10-I70.0). Coronary artery calcification.   12/29/2020 Pathology Results   FINAL MICROSCOPIC DIAGNOSIS:   A. SENTINEL LYMPH NODE, RIGHT OBTURATOR, EXCISION:  -  No carcinoma identified in one lymph node (0/1)  -  See comment   B. SENTINEL LYMPH NODE, LEFT OBTURATOR, EXCISION:  -  Micrometastasis involving one lymph node (60m/1)  -  See comment   C. UTERUS, CERVIX, BILATERAL TUBES AND OVARIES:  Uterus:  -  Endometrioid carcinoma, FIGO grade 2  -  See oncology table and comment below   Cervix:  -  No carcinoma identified   Bilateral Ovaries:  -  No carcinoma identified   Bilateral Fallopian tubes:  -  No carcinoma identified   ONCOLOGY TABLE:   UTERUS, CARCINOMA OR CARCINOSARCOMA: Resection   Procedure: Total hysterectomy and bilateral salpingo-oophorectomy  Histologic Type: Endometrioid carcinoma  Histologic Grade: FIGO grade 2  Myometrial Invasion:       Percentage of Myometrial  Invasion: Estimated to be less than 50%  Uterine Serosa Involvement: Not identified  Cervical stromal Involvement: Not identified  Extent of involvement of other tissue/organs: Not identified  Peritoneal/Ascitic Fluid: Not submitted/unknown  Lymphovascular Invasion: Not identified  Regional Lymph Nodes:       Pelvic Lymph Nodes Examined:  2 Sentinel                                0 non-sentinel                                   2 total       Pelvic Lymph Nodes with Metastasis: 0                           Macrometastasis: (>2.0 mm): 0                          Micrometastasis: (>0.2 mm and < 2.0 mm): 1                            Isolated Tumor Cells (<0.2 mm): 0                       Laterality of Lymph Node with Tumor: 0                             Extracapsular Extension: N/A   Distant Metastasis:       Distant Site(s) Involved: N/A  Pathologic Stage Classification (pTNM, AJCC 8th Edition): pT1a, pN79m  Ancillary Studies: MMR / MSI testing will be ordered  Representative Tumor Block: C1  Comment(s): Cytokeratin AE1/3 was performed on the lymph nodes (parts a and B) and supports the presence of micrometastasis (part B only). Drs. PSaralyn Pilarand CNorth [facility] LLCreviewed select parts of the case and agree with the above diagnosis.   (v4.2.0.1)     12/29/2020 Surgery   Surgeon: RDonaciano EvaPre-operative Diagnosis: endometrial cancer grade 3    Operation: Robotic-assisted laparoscopic total hysterectomy with bilateral salpingoophorectomy, SLN biopsy   Surgeon: RDonaciano Eva Specimens: uterus, cervix, bilateral tubes and ovaries, right and left obturator SLN           01/22/2021 Cancer Staging   Staging form: Corpus Uteri - Carcinoma and Carcinosarcoma, AJCC 8th Edition - Pathologic stage from 01/22/2021: Stage IIIC1 (pT1a, pN135m cM0) - Signed by GoHeath LarkMD on 01/22/2021 Stage prefix: Initial diagnosis   01/25/2021 Procedure   Successful placement of a right IJ approach Power Port with ultrasound and fluoroscopic guidance. The catheter is ready for use.   02/07/2021 -  Chemotherapy    Patient is on Treatment Plan: UTERINE CARBOPLATIN AUC 6 / PACLITAXEL Q21D       02/11/2021 Genetic Testing   Negative hereditary cancer genetic testing: no pathogenic variants detected in [facility] Common Hereditary Cancers +RNA Panel.  The report date is February 11, 2021.    The Common Hereditary Cancers + RNA Panel offered by [facility] includes sequencing, deletion/duplication, and RNA testing of the following 47 genes: APC, ATM, AXIN2, BARD1, BMPR1A, BRCA1, BRCA2, BRIP1, CDH1, CDK4*, CDKN2A (p14ARF)*, CDKN2A (p16INK4a)*, CHEK2, CTNNA1, DICER1, EPCAM (Deletion/duplication testing only), GREM1 (promoter  region deletion/duplication testing only), KIT, MEN1, MLH1, MSH2, MSH3, MSH6,  MUTYH, NBN, NF1, NHTL1, PALB2, PDGFRA*, PMS2, POLD1, POLE, PTEN, RAD50, RAD51C, RAD51D, SDHB, SDHC, SDHD, SMAD4, SMARCA4. STK11, TP53, TSC1, TSC2, and VHL.  The following genes were evaluated for sequence changes only: SDHA and HOXB13 c.251G>A variant only.  RNA analysis is not performed for the * genes.       PHYSICAL EXAMINATION: ECOG PERFORMANCE STATUS: 1 - Symptomatic but completely ambulatory  Vitals:   04/10/21 0819  BP: 135/72  Pulse: (!) 102  Resp: 18  Temp: (!) 97.5 F (36.4 C)  SpO2: 97%   Filed Weights   04/10/21 0819  Weight: 224 lb (101.6 kg)    GENERAL:alert, no distress and comfortable SKIN: skin color, texture, turgor are normal, no rashes or significant lesions EYES: normal, Conjunctiva are pink and non-injected, sclera clear OROPHARYNX:no exudate, no erythema and lips, buccal mucosa, and tongue normal  NECK: supple, thyroid normal size, non-tender, without nodularity LYMPH:  no palpable lymphadenopathy in the cervical, axillary or inguinal LUNGS: clear to auscultation and percussion with normal breathing effort HEART: regular rate & rhythm and no murmurs and no lower extremity edema ABDOMEN:abdomen soft, non-tender and normal bowel sounds Musculoskeletal:no cyanosis of digits and no clubbing  NEURO: alert & oriented x 3 with fluent speech, no focal motor/sensory deficits  LABORATORY DATA:  I have reviewed the data as listed    Component Value Date/Time   NA 141 03/21/2021 0853   NA 141 08/31/2020 1149   K 4.5 03/21/2021 0853   CL 107 03/21/2021 0853   CO2 27 03/21/2021 0853   GLUCOSE 111 (H) 03/21/2021 0853   BUN 16 03/21/2021 0853   BUN 15 08/31/2020 1149   CREATININE 0.73 03/21/2021 0853   CREATININE 0.60 11/03/2012 1228   CALCIUM 10.0 03/21/2021 0853   PROT 6.9 03/21/2021 0853   PROT 7.3 08/31/2020 1149   ALBUMIN 3.6 03/21/2021 0853   ALBUMIN 4.7 08/31/2020 1149   AST 13 (L) 03/21/2021 0853   ALT 19 03/21/2021 0853   ALKPHOS 89 03/21/2021  0853   BILITOT 0.3 03/21/2021 0853   GFRNONAA >60 03/21/2021 0853   GFRAA 91 08/31/2020 1149    No results found for: SPEP, UPEP  Lab Results  Component Value Date   WBC 4.2 04/10/2021   NEUTROABS 3.7 04/10/2021   HGB 12.5 04/10/2021   HCT 36.7 04/10/2021   MCV 92.0 04/10/2021   PLT 190 04/10/2021      Chemistry      Component Value Date/Time   NA 141 03/21/2021 0853   NA 141 08/31/2020 1149   K 4.5 03/21/2021 0853   CL 107 03/21/2021 0853   CO2 27 03/21/2021 0853   BUN 16 03/21/2021 0853   BUN 15 08/31/2020 1149   CREATININE 0.73 03/21/2021 0853   CREATININE 0.60 11/03/2012 1228   GLU 81 07/24/2012 0000      Component Value Date/Time   CALCIUM 10.0 03/21/2021 0853   ALKPHOS 89 03/21/2021 0853   AST 13 (L) 03/21/2021 0853   ALT 19 03/21/2021 0853   BILITOT 0.3 03/21/2021 0853

## 2021-04-10 NOTE — Patient Instructions (Signed)
[facility]ICAL [facility]COLOGY  Discharge Instructions: °Thank you for choosing [facility] to provide your oncology and hematology care.  ° °If you have a lab appointment with the Cancer Center, please go directly to the Cancer Center and check in at the registration area. °  °Wear comfortable clothing and clothing appropriate for easy access to any Portacath or PICC line.  ° °We strive to give you quality time with your provider. You may need to reschedule your appointment if you arrive late (15 or more minutes).  Arriving late affects you and other patients whose appointments are after yours.  Also, if you miss three or more appointments without notifying the office, you may be dismissed from the clinic at the provider’s discretion.    °  °For prescription refill requests, have your pharmacy contact our office and allow 72 hours for refills to be completed.   ° °Today you received the following chemotherapy and/or immunotherapy agents paclitaxel, carboplatin    °  °To help prevent nausea and vomiting after your treatment, we encourage you to take your nausea medication as directed. ° °BELOW ARE SYMPTOMS THAT SHOULD BE REPORTED IMMEDIATELY: °*FEVER GREATER THAN 100.4 F (38 °C) OR HIGHER °*CHILLS OR SWEATING °*NAUSEA AND VOMITING THAT IS NOT CONTROLLED WITH YOUR NAUSEA MEDICATION °*UNUSUAL SHORTNESS OF BREATH °*UNUSUAL BRUISING OR BLEEDING °*URINARY PROBLEMS (pain or burning when urinating, or frequent urination) °*BOWEL PROBLEMS (unusual diarrhea, constipation, pain near the anus) °TENDERNESS IN MOUTH AND THROAT WITH OR WITHOUT PRESENCE OF ULCERS (sore throat, sores in mouth, or a toothache) °UNUSUAL RASH, SWELLING OR PAIN  °UNUSUAL VAGINAL DISCHARGE OR ITCHING  ° °Items with * indicate a potential emergency and should be followed up as soon as possible or go to the Emergency Department if any problems should occur. ° °Please show the CHEMOTHERAPY ALERT CARD or IMMUNOTHERAPY ALERT CARD at  check-in to the Emergency Department and triage nurse. ° °Should you have questions after your visit or need to cancel or reschedule your appointment, please contact [facility]  Dept: [phone]  and follow the prompts.  Office hours are 8:00 a.m. to 4:30 p.m. Monday - Friday. Please note that voicemails left after 4:00 p.m. may not be returned until the following business day.  We are closed weekends and major holidays. You have access to a nurse at all times for urgent questions. Please call the main number to the clinic Dept: [phone] and follow the prompts. ° ° °For any non-urgent questions, you may also contact your provider using MyChart. We now offer e-Visits for anyone 72 and older to request care online for non-urgent symptoms. For details visit [email or web address]. °  °Also download the MyChart app! Go to the app store, search "MyChart", open the app, select [facility], and log in with your MyChart username and password. ° °Due to Covid, a mask is required upon entering the hospital/clinic. If you do not have a mask, one will be given to you upon arrival. For doctor visits, patients may have 1 support person aged 18 or older with them. For treatment visits, patients cannot have anyone with them due to current Covid guidelines and our immunocompromised population.  ° °

## 2021-04-12 ENCOUNTER — Other Ambulatory Visit: Payer: Self-pay

## 2021-04-12 ENCOUNTER — Inpatient Hospital Stay: Payer: Medicare Other

## 2021-04-12 VITALS — BP 135/63 | HR 78 | Temp 98.7°F | Resp 18

## 2021-04-12 DIAGNOSIS — R110 Nausea: Secondary | ICD-10-CM | POA: Diagnosis not present

## 2021-04-12 DIAGNOSIS — Z79899 Other long term (current) drug therapy: Secondary | ICD-10-CM | POA: Diagnosis not present

## 2021-04-12 DIAGNOSIS — G629 Polyneuropathy, unspecified: Secondary | ICD-10-CM | POA: Diagnosis not present

## 2021-04-12 DIAGNOSIS — K5900 Constipation, unspecified: Secondary | ICD-10-CM | POA: Diagnosis not present

## 2021-04-12 DIAGNOSIS — G5600 Carpal tunnel syndrome, unspecified upper limb: Secondary | ICD-10-CM | POA: Diagnosis not present

## 2021-04-12 DIAGNOSIS — I700 Atherosclerosis of aorta: Secondary | ICD-10-CM | POA: Diagnosis not present

## 2021-04-12 DIAGNOSIS — C541 Malignant neoplasm of endometrium: Secondary | ICD-10-CM

## 2021-04-12 DIAGNOSIS — Z5111 Encounter for antineoplastic chemotherapy: Secondary | ICD-10-CM | POA: Diagnosis not present

## 2021-04-12 MED ORDER — SODIUM CHLORIDE 0.9 % IV SOLN: Freq: Once | INTRAVENOUS | Status: AC

## 2021-04-12 MED ORDER — SODIUM CHLORIDE 0.9% FLUSH
10.0000 mL | Freq: Once | INTRAVENOUS | Status: AC | PRN
  Administered 2021-04-12: 10 mL

## 2021-04-12 MED ORDER — ALTEPLASE 2 MG IJ SOLR: 2.0000 mg | Freq: Once | INTRAMUSCULAR | Status: DC | PRN

## 2021-04-12 MED ORDER — HEPARIN SOD (PORK) LOCK FLUSH 100 UNIT/ML IV SOLN
500.0000 [IU] | Freq: Once | INTRAVENOUS | Status: AC | PRN
  Administered 2021-04-12: 500 [IU]

## 2021-04-12 NOTE — Patient Instructions (Signed)

## 2021-04-13 ENCOUNTER — Ambulatory Visit: Payer: Medicare Other

## 2021-04-14 ENCOUNTER — Telehealth: Payer: Self-pay | Admitting: Hematology and Oncology

## 2021-04-14 NOTE — Telephone Encounter (Signed)
Scheduled per 8/29 sch msg. Called pt and left a msg

## 2021-04-21 ENCOUNTER — Encounter: Payer: Medicare Other | Admitting: Internal Medicine

## 2021-04-28 MED FILL — Dexamethasone Sodium Phosphate Inj 100 MG/10ML: INTRAMUSCULAR | Qty: 1 | Status: AC

## 2021-04-28 MED FILL — Fosaprepitant Dimeglumine For IV Infusion 150 MG (Base Eq): INTRAVENOUS | Qty: 5 | Status: AC

## 2021-05-01 ENCOUNTER — Inpatient Hospital Stay: Payer: Medicare Other

## 2021-05-01 ENCOUNTER — Inpatient Hospital Stay: Payer: Medicare Other | Admitting: Hematology and Oncology

## 2021-05-01 ENCOUNTER — Other Ambulatory Visit: Payer: Self-pay

## 2021-05-01 ENCOUNTER — Encounter: Payer: Self-pay | Admitting: Hematology and Oncology

## 2021-05-01 ENCOUNTER — Inpatient Hospital Stay: Payer: Medicare Other | Attending: Gynecologic Oncology

## 2021-05-01 DIAGNOSIS — C541 Malignant neoplasm of endometrium: Secondary | ICD-10-CM | POA: Insufficient documentation

## 2021-05-01 DIAGNOSIS — Z79899 Other long term (current) drug therapy: Secondary | ICD-10-CM | POA: Insufficient documentation

## 2021-05-01 DIAGNOSIS — R110 Nausea: Secondary | ICD-10-CM

## 2021-05-01 DIAGNOSIS — R531 Weakness: Secondary | ICD-10-CM | POA: Diagnosis not present

## 2021-05-01 DIAGNOSIS — Z5111 Encounter for antineoplastic chemotherapy: Secondary | ICD-10-CM | POA: Diagnosis not present

## 2021-05-01 LAB — CMP (CANCER CENTER ONLY)
ALT: 19 U/L (ref 0–44)
AST: 15 U/L (ref 15–41)
Albumin: 3.9 g/dL (ref 3.5–5.0)
Alkaline Phosphatase: 104 U/L (ref 38–126)
Anion gap: 11 (ref 5–15)
BUN: 12 mg/dL (ref 8–23)
CO2: 25 mmol/L (ref 22–32)
Calcium: 10.1 mg/dL (ref 8.9–10.3)
Chloride: 106 mmol/L (ref 98–111)
Creatinine: 0.72 mg/dL (ref 0.44–1.00)
GFR, Estimated: >60 mL/min (ref >60)
Glucose, Bld: 162 mg/dL — ABNORMAL HIGH (ref 70–99)
Potassium: 3.8 mmol/L (ref 3.5–5.1)
Sodium: 142 mmol/L (ref 135–145)
Total Bilirubin: 0.4 mg/dL (ref 0.3–1.2)
Total Protein: 7.3 g/dL (ref 6.5–8.1)

## 2021-05-01 LAB — CBC WITH DIFFERENTIAL (CANCER CENTER ONLY)
Abs Immature Granulocytes: 0.03 10*3/uL (ref 0.00–0.07)
Basophils Absolute: 0 10*3/uL (ref 0.0–0.1)
Basophils Relative: 0 %
Eosinophils Absolute: 0 10*3/uL (ref 0.0–0.5)
Eosinophils Relative: 0 %
HCT: 35.1 % — ABNORMAL LOW (ref 36.0–46.0)
Hemoglobin: 11.9 g/dL — ABNORMAL LOW (ref 12.0–15.0)
Immature Granulocytes: 0 %
Lymphocytes Relative: 8 %
Lymphs Abs: 0.6 10*3/uL — ABNORMAL LOW (ref 0.7–4.0)
MCH: 31.6 pg (ref 26.0–34.0)
MCHC: 33.9 g/dL (ref 30.0–36.0)
MCV: 93.1 fL (ref 80.0–100.0)
Monocytes Absolute: 0.1 10*3/uL (ref 0.1–1.0)
Monocytes Relative: 1 %
Neutro Abs: 6.5 10*3/uL (ref 1.7–7.7)
Neutrophils Relative %: 91 %
Platelet Count: 187 10*3/uL (ref 150–400)
RBC: 3.77 MIL/uL — ABNORMAL LOW (ref 3.87–5.11)
RDW: 15.8 % — ABNORMAL HIGH (ref 11.5–15.5)
WBC Count: 7.2 10*3/uL (ref 4.0–10.5)
nRBC: 0 % (ref 0.0–0.2)

## 2021-05-01 MED ORDER — SODIUM CHLORIDE 0.9 % IV SOLN
150.0000 mg | Freq: Once | INTRAVENOUS | Status: AC
  Administered 2021-05-01: 150 mg via INTRAVENOUS
  Filled 2021-05-01: qty 150

## 2021-05-01 MED ORDER — SODIUM CHLORIDE 0.9 % IV SOLN: Freq: Once | INTRAVENOUS | Status: AC

## 2021-05-01 MED ORDER — HEPARIN SOD (PORK) LOCK FLUSH 100 UNIT/ML IV SOLN
500.0000 [IU] | Freq: Once | INTRAVENOUS | Status: AC | PRN
  Administered 2021-05-01: 500 [IU]

## 2021-05-01 MED ORDER — FAMOTIDINE 20 MG IN NS 100 ML IVPB
20.0000 mg | Freq: Once | INTRAVENOUS | Status: AC
  Administered 2021-05-01: 20 mg via INTRAVENOUS
  Filled 2021-05-01: qty 100

## 2021-05-01 MED ORDER — DIPHENHYDRAMINE HCL 50 MG/ML IJ SOLN
25.0000 mg | Freq: Once | INTRAMUSCULAR | Status: AC
  Administered 2021-05-01: 25 mg via INTRAVENOUS
  Filled 2021-05-01: qty 1

## 2021-05-01 MED ORDER — SODIUM CHLORIDE 0.9% FLUSH
10.0000 mL | INTRAVENOUS | Status: DC | PRN
  Administered 2021-05-01: 10 mL

## 2021-05-01 MED ORDER — PALONOSETRON HCL INJECTION 0.25 MG/5ML
0.2500 mg | Freq: Once | INTRAVENOUS | Status: AC
  Administered 2021-05-01: 0.25 mg via INTRAVENOUS
  Filled 2021-05-01: qty 5

## 2021-05-01 MED ORDER — SODIUM CHLORIDE 0.9 % IV SOLN
10.0000 mg | Freq: Once | INTRAVENOUS | Status: AC
  Administered 2021-05-01: 10 mg via INTRAVENOUS
  Filled 2021-05-01: qty 10

## 2021-05-01 MED ORDER — SODIUM CHLORIDE 0.9 % IV SOLN
131.2500 mg/m2 | Freq: Once | INTRAVENOUS | Status: AC
  Administered 2021-05-01: 270 mg via INTRAVENOUS
  Filled 2021-05-01: qty 45

## 2021-05-01 MED ORDER — SODIUM CHLORIDE 0.9 % IV SOLN
642.6000 mg | Freq: Once | INTRAVENOUS | Status: AC
  Administered 2021-05-01: 640 mg via INTRAVENOUS
  Filled 2021-05-01: qty 64

## 2021-05-01 NOTE — Assessment & Plan Note (Signed)
She felt the IV fluids after treatment was helpful We will schedule IV fluid support with each cycle of treatment

## 2021-05-01 NOTE — Assessment & Plan Note (Signed)
She tolerated last cycle of therapy better with aggressive IV fluid support and laxative She had minimum side effects from treatment so far We will proceed with treatment as scheduled I will try to schedule IV fluids 2 days after treatment

## 2021-05-01 NOTE — Progress Notes (Signed)
[place] OFFICE PROGRESS NOTE  Patient Care Team: Janora Norlander, DO as PCP - General (Family Medicine) Lorretta Harp, MD as PCP - Cardiology (Cardiology) Irine Seal, MD (Urology) Paula Compton, MD (Obstetrics and Gynecology) Gatha Mayer, MD (Gastroenterology) Gaynelle Arabian, MD as Consulting Physician (Orthopedic Surgery)  ASSESSMENT & PLAN:  Endometrial cancer [facility]) She tolerated last cycle of therapy better with aggressive IV fluid support and laxative She had minimum side effects from treatment so far We will proceed with treatment as scheduled I will try to schedule IV fluids 2 days after treatment  Nausea without vomiting She felt the IV fluids after treatment was helpful We will schedule IV fluid support with each cycle of treatment  Generalized weakness She has generalized weakness by day 3 after each cycle of treatment that coincide with withdrawal from dexamethasone We discussed the risk and benefits of IV fluid support If she felt that she needed more than 1 day of IV fluids, we will help schedule that for her  No orders of the defined types were placed in this encounter.   All questions were answered. The patient knows to call the clinic with any problems, questions or concerns. The total time spent in the appointment was 20 minutes encounter with patients including review of chart and various tests results, discussions about plan of care and coordination of care plan   Heath Lark, MD 05/01/2021 12:20 PM  INTERVAL HISTORY: Please see below for problem oriented charting. she returns for treatment follow-up for cycle 5 of chemotherapy with carboplatin and paclitaxel She complains of generalized weakness usually by day 3 of therapy She has poor appetite and mild nausea but no vomiting She had numbness affecting the right hand but none elsewhere No recent infection, fever or chill  REVIEW OF SYSTEMS:   Constitutional: Denies  fevers, chills or abnormal weight loss Eyes: Denies blurriness of vision Ears, nose, mouth, throat, and face: Denies mucositis or sore throat Respiratory: Denies cough, dyspnea or wheezes Cardiovascular: Denies palpitation, chest discomfort or lower extremity swelling Skin: Denies abnormal skin rashes Lymphatics: Denies new lymphadenopathy or easy bruising Behavioral/Psych: Mood is stable, no new changes  All other systems were reviewed with the patient and are negative.  I have reviewed the past medical history, past surgical history, social history and family history with the patient and they are unchanged from previous note.  ALLERGIES:  is allergic to tramadol.  MEDICATIONS:  Current Outpatient Medications  Medication Sig Dispense Refill   acetaminophen (TYLENOL) 500 MG tablet Take 1,000 mg by mouth as needed (pain.).     albuterol (VENTOLIN HFA) 108 (90 Base) MCG/ACT inhaler Inhale 2 puffs into the lungs every 6 (six) hours as needed for wheezing or shortness of breath. (Patient taking differently: Inhale 2 puffs into the lungs as needed for wheezing or shortness of breath. PRN) 6.7 g 1   budesonide-formoterol (SYMBICORT) 80-4.5 MCG/ACT inhaler Inhale 2 puffs into the lungs 2 (two) times daily. (Patient taking differently: Inhale 2 puffs into the lungs as needed (respiratory issues.).) 4 each 11   Calcium Carbonate-Vit D-Min (CALCIUM 1200 PO) Take 1 tablet by mouth in the morning.     Cholecalciferol (VITAMIN D3) 2000 units TABS Take 2,000 Units by mouth in the morning.     dexamethasone (DECADRON) 4 MG tablet Take 2 tabs at the night before and 2 tab the morning of chemotherapy, every 3 weeks, by mouth x 6 cycles 36 tablet 6   docusate sodium (  COLACE) 100 MG capsule Take 100 mg by mouth 2 (two) times daily as needed (constipation.).     DULoxetine (CYMBALTA) 30 MG capsule Take 1 capsule (30 mg total) by mouth daily. (Patient taking differently: Take 30 mg by mouth in the morning.) 90  capsule 3   esomeprazole (NEXIUM) 40 MG capsule Take 1 capsule (40 mg total) by mouth daily before breakfast. 90 capsule 3   furosemide (LASIX) 20 MG tablet Take 1 tablet (20 mg total) by mouth daily as needed for fluid. (Patient taking differently: Take 20 mg by mouth as needed for fluid. PRN) 90 tablet 1   lactulose (CHRONULAC) 10 GM/15ML solution Take 15 mLs (10 g total) by mouth 3 (three) times daily. 473 mL 0   levothyroxine (SYNTHROID) 50 MCG tablet TAKE 1 TABLET BY MOUTH  DAILY BEFORE BREAKFAST (Patient taking differently: Take 50 mcg by mouth daily before breakfast.) 90 tablet 3   lidocaine-prilocaine (EMLA) cream Apply to affected area once 30 g 3   losartan-hydrochlorothiazide (HYZAAR) 100-12.5 MG tablet Take 1 tablet by mouth daily. (Patient taking differently: Take 1 tablet by mouth in the morning.) 90 tablet 3   Omega-3 Fatty Acids (FISH OIL) 1000 MG CAPS Take 1,000 mg by mouth in the morning.     ondansetron (ZOFRAN) 8 MG tablet Take 1 tablet (8 mg total) by mouth 2 (two) times daily as needed. Start on the third day after chemotherapy. 30 tablet 1   oxybutynin (DITROPAN) 5 MG tablet Take 2 tablets (10 mg total) by mouth 2 (two) times daily. 360 tablet 3   Plecanatide (TRULANCE) 3 MG TABS Take 1 tablet by mouth daily. 8 tablet 0   prochlorperazine (COMPAZINE) 10 MG tablet Take 1 tablet (10 mg total) by mouth every 6 (six) hours as needed (Nausea or vomiting). 30 tablet 1   valACYclovir (VALTREX) 1000 MG tablet Take 1 tablet twice daily for 7 days.  If you have another flare, take 2 tablets twice a day for ONE day only per flareup. (Patient taking differently: Take 500 mg by mouth 2 (two) times daily as needed (fever blisters). PRN) 30 tablet 0   vitamin B-12 (CYANOCOBALAMIN) 1000 MCG tablet Take 1,000 mcg by mouth in the morning.     No current facility-administered medications for this visit.   Facility-Administered Medications Ordered in Other Visits  Medication Dose Route Frequency  Provider Last Rate Last Admin   CARBOplatin (PARAPLATIN) 640 mg in sodium chloride 0.9 % 250 mL chemo infusion  640 mg Intravenous Once Alvy Bimler, Sigfredo Schreier, MD       dexamethasone (DECADRON) 10 mg in sodium chloride 0.9 % 50 mL IVPB  10 mg Intravenous Once Alvy Bimler, Victorina Kable, MD 204 mL/hr at 05/01/21 1208 10 mg at 05/01/21 1208   heparin lock flush 100 unit/mL  500 Units Intracatheter Once PRN Heath Lark, MD       PACLitaxel (TAXOL) 270 mg in sodium chloride 0.9 % 250 mL chemo infusion (> 48m/m2)  131.25 mg/m2 (Treatment Plan Recorded) Intravenous Once Veleda Mun, MD       sodium chloride flush (NS) 0.9 % injection 10 mL  10 mL Intracatheter PRN GHeath Lark MD        SUMMARY OF ONCOLOGIC HISTORY: Oncology History Overview Note  MSI stable, endometrioid grade 2 Neg genetics   [place]omet[place]  12/19/2020 Initial Diagnosis   [place]omet[place]   12/26/2020 Imaging   1. Lower uterine segment/cervical mass without evidence of metastatic disease. 2. Hepatic steatosis. 3.  Aortic atherosclerosis (ICD10-I70.0). Coronary artery calcification.   12/29/2020 Pathology Results   FINAL MICROSCOPIC DIAGNOSIS:   A. SENTINEL LYMPH NODE, RIGHT OBTURATOR, EXCISION:  -  No carcinoma identified in one lymph node (0/1)  -  See comment   B. SENTINEL LYMPH NODE, LEFT OBTURATOR, EXCISION:  -  Micrometastasis involving one lymph node (44m/1)  -  See comment   C. UTERUS, CERVIX, BILATERAL TUBES AND OVARIES:  Uterus:  -  Endometrioid carcinoma, FIGO grade 2  -  See oncology table and comment below   Cervix:  -  No carcinoma identified   Bilateral Ovaries:  -  No carcinoma identified   Bilateral Fallopian tubes:  -  No carcinoma identified   ONCOLOGY TABLE:   UTERUS, CARCINOMA OR CARCINOSARCOMA: Resection   Procedure: Total hysterectomy and bilateral salpingo-oophorectomy  Histologic Type: Endometrioid carcinoma  Histologic Grade: FIGO grade 2  Myometrial Invasion:       Percentage of  Myometrial Invasion: Estimated to be less than 50%  Uterine Serosa Involvement: Not identified  Cervical stromal Involvement: Not identified  Extent of involvement of other tissue/organs: Not identified  Peritoneal/Ascitic Fluid: Not submitted/unknown  Lymphovascular Invasion: Not identified  Regional Lymph Nodes:       Pelvic Lymph Nodes Examined:  2 Sentinel                                0 non-sentinel                                   2 total       Pelvic Lymph Nodes with Metastasis: 0                           Macrometastasis: (>2.0 mm): 0                          Micrometastasis: (>0.2 mm and < 2.0 mm): 1                            Isolated Tumor Cells (<0.2 mm): 0                       Laterality of Lymph Node with Tumor: 0                             Extracapsular Extension: N/A  Distant Metastasis:       Distant Site(s) Involved: N/A  Pathologic Stage Classification (pTNM, AJCC 8th Edition): pT1a, pN136m Ancillary Studies: MMR / MSI testing will be ordered  Representative Tumor Block: C1  Comment(s): Cytokeratin AE1/3 was performed on the lymph nodes (parts a and B) and supports the presence of micrometastasis (part B only). Drs. PaSaralyn Pilarnd [facility] Centereviewed select parts of the case and agree with the above diagnosis.   (v4.2.0.1)     12/29/2020 Surgery   Surgeon: RoDonaciano Evare-operative Diagnosis: endometrial cancer grade 3    Operation: Robotic-assisted laparoscopic total hysterectomy with bilateral salpingoophorectomy, SLN biopsy   Surgeon: RoDonaciano EvaSpecimens: uterus, cervix, bilateral tubes and ovaries, right and left obturator SLN           01/22/2021 Cancer Staging  Staging form: Corpus Uteri - Carcinoma and Carcinosarcoma, AJCC 8th Edition - Pathologic stage from 01/22/2021: Stage IIIC1 (pT1a, pN23m, cM0) - Signed by GHeath Lark MD on 01/22/2021 Stage prefix: Initial diagnosis   01/25/2021 Procedure   Successful placement of a right IJ  approach Power Port with ultrasound and fluoroscopic guidance. The catheter is ready for use.   02/07/2021 -  Chemotherapy    Patient is on Treatment Plan: UTERINE CARBOPLATIN AUC 6 / PACLITAXEL Q21D       02/11/2021 Genetic Testing   Negative hereditary cancer genetic testing: no pathogenic variants detected in [facility] Common Hereditary Cancers +RNA Panel.  The report date is February 11, 2021.    The Common Hereditary Cancers + RNA Panel offered by [facility] includes sequencing, deletion/duplication, and RNA testing of the following 47 genes: APC, ATM, AXIN2, BARD1, BMPR1A, BRCA1, BRCA2, BRIP1, CDH1, CDK4*, CDKN2A (p14ARF)*, CDKN2A (p16INK4a)*, CHEK2, CTNNA1, DICER1, EPCAM (Deletion/duplication testing only), GREM1 (promoter region deletion/duplication testing only), KIT, MEN1, MLH1, MSH2, MSH3, MSH6, MUTYH, NBN, NF1, NHTL1, PALB2, PDGFRA*, PMS2, POLD1, POLE, PTEN, RAD50, RAD51C, RAD51D, SDHB, SDHC, SDHD, SMAD4, SMARCA4. STK11, TP53, TSC1, TSC2, and VHL.  The following genes were evaluated for sequence changes only: SDHA and HOXB13 c.251G>A variant only.  RNA analysis is not performed for the * genes.       PHYSICAL EXAMINATION: ECOG PERFORMANCE STATUS: 1 - Symptomatic but completely ambulatory  Vitals:   05/01/21 1058  BP: (!) 144/81  Pulse: 98  Resp: 18  Temp: 97.7 F (36.5 C)  SpO2: 100%   Filed Weights   05/01/21 1058  Weight: 222 lb (100.7 kg)    GENERAL:alert, no distress and comfortable SKIN: skin color, texture, turgor are normal, no rashes or significant lesions EYES: normal, Conjunctiva are pink and non-injected, sclera clear OROPHARYNX:no exudate, no erythema and lips, buccal mucosa, and tongue normal  NECK: supple, thyroid normal size, non-tender, without nodularity LYMPH:  no palpable lymphadenopathy in the cervical, axillary or inguinal LUNGS: clear to auscultation and percussion with normal breathing effort HEART: regular rate & rhythm and no murmurs and no lower  extremity edema ABDOMEN:abdomen soft, non-tender and normal bowel sounds Musculoskeletal:no cyanosis of digits and no clubbing  NEURO: alert & oriented x 3 with fluent speech, no focal motor/sensory deficits  LABORATORY DATA:  I have reviewed the data as listed    Component Value Date/Time   NA 142 05/01/2021 1030   NA 141 08/31/2020 1149   K 3.8 05/01/2021 1030   CL 106 05/01/2021 1030   CO2 25 05/01/2021 1030   GLUCOSE 162 (H) 05/01/2021 1030   BUN 12 05/01/2021 1030   BUN 15 08/31/2020 1149   CREATININE 0.72 05/01/2021 1030   CREATININE 0.60 11/03/2012 1228   CALCIUM 10.1 05/01/2021 1030   PROT 7.3 05/01/2021 1030   PROT 7.3 08/31/2020 1149   ALBUMIN 3.9 05/01/2021 1030   ALBUMIN 4.7 08/31/2020 1149   AST 15 05/01/2021 1030   ALT 19 05/01/2021 1030   ALKPHOS 104 05/01/2021 1030   BILITOT 0.4 05/01/2021 1030   GFRNONAA >60 05/01/2021 1030   GFRAA 91 08/31/2020 1149    No results found for: SPEP, UPEP  Lab Results  Component Value Date   WBC 7.2 05/01/2021   NEUTROABS 6.5 05/01/2021   HGB 11.9 (L) 05/01/2021   HCT 35.1 (L) 05/01/2021   MCV 93.1 05/01/2021   PLT 187 05/01/2021      Chemistry      Component Value Date/Time   NA  142 05/01/2021 1030   NA 141 08/31/2020 1149   K 3.8 05/01/2021 1030   CL 106 05/01/2021 1030   CO2 25 05/01/2021 1030   BUN 12 05/01/2021 1030   BUN 15 08/31/2020 1149   CREATININE 0.72 05/01/2021 1030   CREATININE 0.60 11/03/2012 1228   GLU 81 07/24/2012 0000      Component Value Date/Time   CALCIUM 10.1 05/01/2021 1030   ALKPHOS 104 05/01/2021 1030   AST 15 05/01/2021 1030   ALT 19 05/01/2021 1030   BILITOT 0.4 05/01/2021 1030

## 2021-05-01 NOTE — Patient Instructions (Signed)
[facility] MEDICAL ONCOLOGY   Discharge Instructions: Thank you for choosing [facility] to provide your oncology and hematology care.   If you have a lab appointment with the Cancer Center, please go directly to the Cancer Center and check in at the registration area.   Wear comfortable clothing and clothing appropriate for easy access to any Portacath or PICC line.   We strive to give you quality time with your provider. You may need to reschedule your appointment if you arrive late (15 or more minutes).  Arriving late affects you and other patients whose appointments are after yours.  Also, if you miss three or more appointments without notifying the office, you may be dismissed from the clinic at the provider's discretion.      For prescription refill requests, have your pharmacy contact our office and allow 72 hours for refills to be completed.    Today you received the following chemotherapy and/or immunotherapy agents: paclitaxel and carboplatin.      To help prevent nausea and vomiting after your treatment, we encourage you to take your nausea medication as directed.  BELOW ARE SYMPTOMS THAT SHOULD BE REPORTED IMMEDIATELY: *FEVER GREATER THAN 100.4 F (38 C) OR HIGHER *CHILLS OR SWEATING *NAUSEA AND VOMITING THAT IS NOT CONTROLLED WITH YOUR NAUSEA MEDICATION *UNUSUAL SHORTNESS OF BREATH *UNUSUAL BRUISING OR BLEEDING *URINARY PROBLEMS (pain or burning when urinating, or frequent urination) *BOWEL PROBLEMS (unusual diarrhea, constipation, pain near the anus) TENDERNESS IN MOUTH AND THROAT WITH OR WITHOUT PRESENCE OF ULCERS (sore throat, sores in mouth, or a toothache) UNUSUAL RASH, SWELLING OR PAIN  UNUSUAL VAGINAL DISCHARGE OR ITCHING   Items with * indicate a potential emergency and should be followed up as soon as possible or go to the Emergency Department if any problems should occur.  Please show the CHEMOTHERAPY ALERT CARD or IMMUNOTHERAPY ALERT  CARD at check-in to the Emergency Department and triage nurse.  Should you have questions after your visit or need to cancel or reschedule your appointment, please contact [facility] MEDICAL ONCOLOGY  Dept: [phone]  and follow the prompts.  Office hours are 8:00 a.m. to 4:30 p.m. Monday - Friday. Please note that voicemails left after 4:00 p.m. may not be returned until the following business day.  We are closed weekends and major holidays. You have access to a nurse at all times for urgent questions. Please call the main number to the clinic Dept: [phone] and follow the prompts.   For any non-urgent questions, you may also contact your provider using MyChart. We now offer e-Visits for anyone 18 and older to request care online for non-urgent symptoms. For details visit [email or web address].   Also download the MyChart app! Go to the app store, search "MyChart", open the app, select [facility], and log in with your MyChart username and password.  Due to Covid, a mask is required upon entering the hospital/clinic. If you do not have a mask, one will be given to you upon arrival. For doctor visits, patients may have 1 support person aged 18 or older with them. For treatment visits, patients cannot have anyone with them due to current Covid guidelines and our immunocompromised population.   

## 2021-05-01 NOTE — Assessment & Plan Note (Signed)
She has generalized weakness by day 3 after each cycle of treatment that coincide with withdrawal from dexamethasone We discussed the risk and benefits of IV fluid support If she felt that she needed more than 1 day of IV fluids, we will help schedule that for her

## 2021-05-02 ENCOUNTER — Other Ambulatory Visit: Payer: Self-pay

## 2021-05-03 ENCOUNTER — Inpatient Hospital Stay: Payer: Medicare Other

## 2021-05-03 ENCOUNTER — Other Ambulatory Visit: Payer: Self-pay

## 2021-05-03 DIAGNOSIS — R531 Weakness: Secondary | ICD-10-CM | POA: Diagnosis not present

## 2021-05-03 DIAGNOSIS — R110 Nausea: Secondary | ICD-10-CM | POA: Diagnosis not present

## 2021-05-03 DIAGNOSIS — Z5111 Encounter for antineoplastic chemotherapy: Secondary | ICD-10-CM | POA: Diagnosis not present

## 2021-05-03 DIAGNOSIS — C541 Malignant neoplasm of endometrium: Secondary | ICD-10-CM

## 2021-05-03 DIAGNOSIS — Z79899 Other long term (current) drug therapy: Secondary | ICD-10-CM | POA: Diagnosis not present

## 2021-05-03 MED ORDER — SODIUM CHLORIDE 0.9 % IV SOLN: Freq: Once | INTRAVENOUS | Status: AC

## 2021-05-03 NOTE — Patient Instructions (Signed)

## 2021-05-09 ENCOUNTER — Other Ambulatory Visit: Payer: Self-pay

## 2021-05-09 ENCOUNTER — Telehealth: Payer: Self-pay

## 2021-05-09 DIAGNOSIS — R300 Dysuria: Secondary | ICD-10-CM

## 2021-05-09 NOTE — Telephone Encounter (Signed)
She called and left a message. She is having burning, frequency and pain with urination. She thinks that she has a UTI. She is asking if you can send the same medication as before for UTI?

## 2021-05-09 NOTE — Telephone Encounter (Signed)
Called and given below message. She verbalized understanding and will come in on Thursday for lab appt at 10 am. She is aware of appt time and this is the earliest that she can come to [facility]. For worsening symptoms she will go to PCP

## 2021-05-11 ENCOUNTER — Other Ambulatory Visit: Payer: Self-pay

## 2021-05-11 ENCOUNTER — Inpatient Hospital Stay: Payer: Medicare Other

## 2021-05-11 DIAGNOSIS — R110 Nausea: Secondary | ICD-10-CM | POA: Diagnosis not present

## 2021-05-11 DIAGNOSIS — R300 Dysuria: Secondary | ICD-10-CM

## 2021-05-11 DIAGNOSIS — R531 Weakness: Secondary | ICD-10-CM | POA: Diagnosis not present

## 2021-05-11 DIAGNOSIS — Z5111 Encounter for antineoplastic chemotherapy: Secondary | ICD-10-CM | POA: Diagnosis not present

## 2021-05-11 DIAGNOSIS — Z79899 Other long term (current) drug therapy: Secondary | ICD-10-CM | POA: Diagnosis not present

## 2021-05-11 LAB — URINALYSIS, COMPLETE (UACMP) WITH MICROSCOPIC
Bilirubin Urine: NEGATIVE
Glucose, UA: NEGATIVE mg/dL
Hgb urine dipstick: NEGATIVE
Ketones, ur: NEGATIVE mg/dL
Leukocytes,Ua: NEGATIVE
Nitrite: NEGATIVE
Protein, ur: NEGATIVE mg/dL
Specific Gravity, Urine: 1.01 (ref 1.005–1.030)
pH: 7 (ref 5.0–8.0)

## 2021-05-12 ENCOUNTER — Ambulatory Visit (INDEPENDENT_AMBULATORY_CARE_PROVIDER_SITE_OTHER): Payer: Medicare Other

## 2021-05-12 VITALS — Ht 61.0 in | Wt 222.0 lb

## 2021-05-12 DIAGNOSIS — Z0000 Encounter for general adult medical examination without abnormal findings: Secondary | ICD-10-CM | POA: Diagnosis not present

## 2021-05-12 LAB — URINE CULTURE: Culture: <10000 — AB

## 2021-05-12 NOTE — Progress Notes (Signed)
Subjective:   Stacie Russell is a 70 y.o. female who presents for Medicare Annual (Subsequent) preventive examination.  Virtual Visit via Telephone Note  I connected with  Stacie Russell on 05/12/21 at  1:15 PM EDT by telephone and verified that I am speaking with the correct person using two identifiers.  Location: Patient: Home Provider: [facility] Persons participating in the virtual visit: patient/Nurse Health Advisor   I discussed the limitations, risks, security and privacy concerns of performing an evaluation and management service by telephone and the availability of in person appointments. The patient expressed understanding and agreed to proceed.  Interactive audio and video telecommunications were attempted between this nurse and patient, however failed, due to patient having technical difficulties OR patient did not have access to video capability.  We continued and completed visit with audio only.  Some vital signs may be absent or patient reported.   Melvie Paglia E Julieanne Hadsall, LPN   Review of Systems     Cardiac Risk Factors include: advanced age (>30men, >75 women);sedentary lifestyle;obesity (BMI >30kg/m2);dyslipidemia;hypertension;Other (see comment), Risk factor comments: endometrial cancer, chemotherapy     Objective:    Today's Vitals   05/12/21 1245 05/12/21 1246  Weight: 222 lb (100.7 kg)   Height: 5\' 1"  (1.549 m)   PainSc:  0-No pain   Body mass index is 41.95 kg/m.  Advanced Directives 05/12/2021 05/03/2021 03/21/2021 02/07/2021 01/25/2021 12/26/2020 05/11/2020  Does Patient Have a Medical Advance Directive? Yes Yes Yes Yes No Yes Yes  Type of Paramedic of [place];Living will [place];Living will [place];Living will - - [place];Living will [place]  Does patient want to make changes to medical advance directive? - No - Patient declined - - - - No - Patient  declined  Copy of [place] in Chart? Yes - validated most recent copy scanned in chart (See row information) No - copy requested No - copy requested No - copy requested - - No - copy requested  Would patient like information on creating a medical advance directive? - - - - No - Patient declined - -    Current Medications (verified) Outpatient Encounter Medications as of 05/12/2021  Medication Sig   acetaminophen (TYLENOL) 500 MG tablet Take 1,000 mg by mouth as needed (pain.).   albuterol (VENTOLIN HFA) 108 (90 Base) MCG/ACT inhaler Inhale 2 puffs into the lungs every 6 (six) hours as needed for wheezing or shortness of breath. (Patient taking differently: Inhale 2 puffs into the lungs as needed for wheezing or shortness of breath. PRN)   budesonide-formoterol (SYMBICORT) 80-4.5 MCG/ACT inhaler Inhale 2 puffs into the lungs 2 (two) times daily. (Patient taking differently: Inhale 2 puffs into the lungs as needed (respiratory issues.).)   Calcium Carbonate-Vit D-Min (CALCIUM 1200 PO) Take 1 tablet by mouth in the morning.   Cholecalciferol (VITAMIN D3) 2000 units TABS Take 2,000 Units by mouth in the morning.   dexamethasone (DECADRON) 4 MG tablet Take 2 tabs at the night before and 2 tab the morning of chemotherapy, every 3 weeks, by mouth x 6 cycles   docusate sodium (COLACE) 100 MG capsule Take 100 mg by mouth 2 (two) times daily as needed (constipation.).   DULoxetine (CYMBALTA) 30 MG capsule Take 1 capsule (30 mg total) by mouth daily. (Patient taking differently: Take 30 mg by mouth in the morning.)   esomeprazole (NEXIUM) 40 MG capsule Take 1 capsule (40 mg  total) by mouth daily before breakfast.   furosemide (LASIX) 20 MG tablet Take 1 tablet (20 mg total) by mouth daily as needed for fluid. (Patient taking differently: Take 20 mg by mouth as needed for fluid. PRN)   lactulose (CHRONULAC) 10 GM/15ML solution Take 15 mLs (10 g total) by mouth 3 (three) times daily.    levothyroxine (SYNTHROID) 50 MCG tablet TAKE 1 TABLET BY MOUTH  DAILY BEFORE BREAKFAST (Patient taking differently: Take 50 mcg by mouth daily before breakfast.)   lidocaine-prilocaine (EMLA) cream Apply to affected area once   losartan-hydrochlorothiazide (HYZAAR) 100-12.5 MG tablet Take 1 tablet by mouth daily. (Patient taking differently: Take 1 tablet by mouth in the morning.)   Omega-3 Fatty Acids (FISH OIL) 1000 MG CAPS Take 1,000 mg by mouth in the morning.   ondansetron (ZOFRAN) 8 MG tablet Take 1 tablet (8 mg total) by mouth 2 (two) times daily as needed. Start on the third day after chemotherapy.   oxybutynin (DITROPAN) 5 MG tablet Take 2 tablets (10 mg total) by mouth 2 (two) times daily.   Plecanatide (TRULANCE) 3 MG TABS Take 1 tablet by mouth daily.   prochlorperazine (COMPAZINE) 10 MG tablet Take 1 tablet (10 mg total) by mouth every 6 (six) hours as needed (Nausea or vomiting).   valACYclovir (VALTREX) 1000 MG tablet Take 1 tablet twice daily for 7 days.  If you have another flare, take 2 tablets twice a day for ONE day only per flareup. (Patient taking differently: Take 500 mg by mouth 2 (two) times daily as needed (fever blisters). PRN)   vitamin B-12 (CYANOCOBALAMIN) 1000 MCG tablet Take 1,000 mcg by mouth in the morning.   No facility-administered encounter medications on file as of 05/12/2021.    Allergies (verified) Tramadol   History: Past Medical History:  Diagnosis Date   Arthritis    Asthma    Diverticulosis    Dyspnea    with exertion    Endometrial hyperplasia    Esophageal stricture    Family history of breast cancer 01/26/2021   Foot pain    Fundic gland polyps of stomach, benign 07/10/2015   Gastritis    GERD (gastroesophageal reflux disease)    Hemorrhoid    Hiatal hernia    Hip pain    History of kidney stones    cystoscopy   History of surgery on arm    left humerus   Hyperlipidemia    Hypertension    Hypothyroidism    Knee pain    Metabolic  syndrome    Obstructive sleep apnea 08/27/2017   pt denies    Oxygen deficiency    pt denies   PONV (postoperative nausea and vomiting)    Spinal headache    few headaches recently    Tendonitis of ankle    Vitamin D deficiency    Past Surgical History:  Procedure Laterality Date   ANAL FISSURE REPAIR     CATARACT EXTRACTION  May 2009   COLONOSCOPY  12/26/07   ESOPHAGEAL DILATION     ESOPHAGOGASTRODUODENOSCOPY  12/26/07, 05/13/08   EYE SURGERY Bilateral    bilateral cataract with lens implants   IR IMAGING GUIDED PORT INSERTION  01/25/2021   KNEE CARTILAGE SURGERY Right Sept 1999   ORIF HUMERUS FRACTURE Left 01/12/2020   Procedure: OPEN REDUCTION INTERNAL FIXATION (ORIF) LEFT HUMERUS FRACTURE;  Surgeon: Roseanne Kaufman, MD;  Location: [facility];  Service: Orthopedics;  Laterality: Left;   ROBOTIC ASSISTED TOTAL HYSTERECTOMY WITH BILATERAL SALPINGO  OOPHERECTOMY Bilateral 12/29/2020   Procedure: XI ROBOTIC ASSISTED TOTAL HYSTERECTOMY WITH BILATERAL SALPINGO OOPHORECTOMY;  Surgeon: Everitt Amber, MD;  Location: [facility];  Service: Gynecology;  Laterality: Bilateral;   SENTINEL NODE BIOPSY N/A 12/29/2020   Procedure: SENTINEL NODE BIOPSY;  Surgeon: Everitt Amber, MD;  Location: [facility];  Service: Gynecology;  Laterality: N/A;   TOTAL KNEE ARTHROPLASTY Right 10/07/2017   Procedure: RIGHT TOTAL KNEE ARTHROPLASTY;  Surgeon: Gaynelle Arabian, MD;  Location: [facility];  Service: Orthopedics;  Laterality: Right;  Adductor Block   UMBILICAL HERNIA REPAIR  05/20/06   Family History  Problem Relation Age of Onset   Colon cancer Mother 76   Heart attack Father    Heart disease Sister    Breast cancer Sister 37   Breast cancer Sister        dx 59s   Cancer Maternal Aunt 90       "female cancer", unknown type   Cancer Maternal Grandmother        unknown type; dx 41s   Cancer Maternal Grandfather        unknown type; dx 71s   Cancer Cousin        maternal female cousin; dx 61s; unknown type   Cancer Cousin         maternal female cousin; dx 64s; unknown type   Cancer Cousin        maternal female cousin; dx after 27; unknown type   Esophageal cancer Neg Hx    Stomach cancer Neg Hx    Rectal cancer Neg Hx    Social History   Socioeconomic History   Marital status: Married    Spouse name: Marcello Moores   Number of children: 2   Years of education: 14   Highest education level: Associate degree: academic program  Occupational History   Occupation: Retired  Tobacco Use   Smoking status: Never   Smokeless tobacco: Never  Scientific laboratory technician Use: Never used  Substance and Sexual Activity   Alcohol use: No   Drug use: No   Sexual activity: Not Currently    Birth control/protection: Post-menopausal  Other Topics Concern   Not on file  Social History Narrative   Married and retired   No EtOH, tobacco or drug use   Social Determinants of Radio broadcast assistant Strain: Low Risk    Difficulty of Paying Living Expenses: Not hard at all  Food Insecurity: No Food Insecurity   Worried About Charity fundraiser in the Last Year: Never true   Arboriculturist in the Last Year: Never true  Transportation Needs: No Transportation Needs   Lack of Transportation (Medical): No   Lack of Transportation (Non-Medical): No  Physical Activity: Inactive   Days of Exercise per Week: 0 days   Minutes of Exercise per Session: 0 min  Stress: Stress Concern Present   Feeling of Stress : To some extent  Social Connections: Engineer, building services of Communication with Friends and Family: More than three times a week   Frequency of Social Gatherings with Friends and Family: More than three times a week   Attends Religious Services: More than 4 times per year   Active Member of [employer] or Organizations: Yes   Attends Archivist Meetings: 1 to 4 times per year   Marital Status: Married    Tobacco Counseling Counseling given: Not Answered   Clinical Intake:  Pre-visit preparation  completed: Yes  Pain :  No/denies pain Pain Score: 0-No pain     BMI - recorded: 41.95 Nutritional Status: BMI > 30  Obese Nutritional Risks: None Diabetes: No  How often do you need to have someone help you when you read instructions, pamphlets, or other written materials from your doctor or pharmacy?: 1 - Never  Diabetic? No  Interpreter Needed?: No  Information entered by :: Tristin Vandeusen, LPN   Activities of Daily Living In your present state of health, do you have any difficulty performing the following activities: 05/12/2021 12/26/2020  Hearing? N N  Vision? N N  Difficulty concentrating or making decisions? Y N  Walking or climbing stairs? N Y  Dressing or bathing? N N  Doing errands, shopping? Y N  Preparing Food and eating ? N -  Using the Toilet? N -  In the past six months, have you accidently leaked urine? Y -  Comment bad since hysterctomy - wears thick pads for protection -  Do you have problems with loss of bowel control? N -  Managing your Medications? N -  Managing your Finances? N -  Housekeeping or managing your Housekeeping? N -  Some recent data might be hidden    Patient Care Team: Janora Norlander, DO as PCP - General (Family Medicine) Lorretta Harp, MD as PCP - Cardiology (Cardiology) Irine Seal, MD (Urology) Paula Compton, MD (Obstetrics and Gynecology) Gatha Mayer, MD (Gastroenterology) Gaynelle Arabian, MD as Consulting Physician (Orthopedic Surgery)  Indicate any recent Medical Services you may have received from other than [facility] providers in the past year (date may be approximate).     Assessment:   This is a routine wellness examination for Stacie Russell.  Hearing/Vision screen Hearing Screening - Comments:: Denies hearing difficulties  Vision Screening - Comments:: Wears readers prn only - up to date with annual eye exams with Dr Katy Fitch  Dietary issues and exercise activities discussed: Current Exercise Habits: The patient does  not participate in regular exercise at present, Exercise limited by: Other - see comments (weakness due to chemo)   Goals Addressed             This Visit's Progress    DIET - INCREASE WATER INTAKE   On track    Try to drink 6-8 glasses of water daily.       Depression Screen PHQ 2/9 Scores 05/12/2021 03/22/2021 08/31/2020 03/01/2020 08/20/2019 04/29/2019 06/25/2018  PHQ - 2 Score 3 3 0 0 0 0 0  PHQ- 9 Score 10 11 0 - 0 - -    Fall Risk Fall Risk  05/12/2021 03/22/2021 08/31/2020 03/01/2020 03/01/2020  Falls in the past year? 0 0 1 1 0  Number falls in past yr: 0 - 1 0 -  Injury with Fall? 0 - 1 1 -  Risk for fall due to : Other (Comment) - History of fall(s) History of fall(s) -  Risk for fall due to: Comment fatigue, weakness from chemo treatments - - - -  Follow up Falls prevention discussed - Falls prevention discussed Falls evaluation completed -  Comment - - - - -    FALL RISK PREVENTION PERTAINING TO THE HOME:  Any stairs in or around the home? No  If so, are there any without handrails? No  Home free of loose throw rugs in walkways, pet beds, electrical cords, etc? Yes  Adequate lighting in your home to reduce risk of falls? Yes   ASSISTIVE DEVICES UTILIZED TO PREVENT FALLS:  Life alert?  No  Use of a cane, walker or w/c? No  Grab bars in the bathroom? No  Shower chair or bench in shower? Yes  Elevated toilet seat or a handicapped toilet? Yes   TIMED UP AND GO:  Was the test performed? No . Telephonic visit  Cognitive Function: Normal cognitive status assessed by direct observation by this Nurse Health Advisor. No abnormalities found. She declines this test today due to not thinking clearly from chemotherapy.  MMSE - Mini Mental State Exam 04/22/2018  Orientation to time 5  Orientation to Place 5  Registration 3  Attention/ Calculation 5  Recall 3  Language- name 2 objects 2  Language- repeat 1  Language- follow 3 step command 3  Language- read & follow  direction 1  Write a sentence 1  Copy design 1  Total score 30     6CIT Screen 05/11/2020 04/29/2019  What Year? 0 points 0 points  What month? 0 points 0 points  What time? 0 points 0 points  Count back from 20 0 points 0 points  Months in reverse 0 points 0 points  Repeat phrase 0 points 0 points  Total Score 0 0    Immunizations Immunization History  Administered Date(s) Administered   Fluad Quad(high Dose 65+) 06/16/2019, 08/31/2020   Influenza Whole 07/25/2012   Influenza, High Dose Seasonal PF 06/11/2017, 06/25/2018   Influenza,inj,Quad PF,6+ Mos 05/06/2013, 06/25/2014, 06/20/2015   Moderna SARS-COV2 Booster Vaccination 08/31/2020   Moderna Sars-Covid-2 Vaccination 09/14/2019, 10/05/2019   Pneumococcal Conjugate-13 05/06/2013   Pneumococcal Polysaccharide-23 09/05/2016   Tdap 04/26/2011   Zoster, Live 04/26/2011    TDAP status: Due, Education has been provided regarding the importance of this vaccine. Advised may receive this vaccine at local pharmacy or Health Dept. Aware to provide a copy of the vaccination record if obtained from local pharmacy or Health Dept. Verbalized acceptance and understanding.  Flu Vaccine status: Due, Education has been provided regarding the importance of this vaccine. Advised may receive this vaccine at local pharmacy or Health Dept. Aware to provide a copy of the vaccination record if obtained from local pharmacy or Health Dept. Verbalized acceptance and understanding.  Pneumococcal vaccine status: Up to date  Covid-19 vaccine status: Completed vaccines  Qualifies for Shingles Vaccine? Yes   Zostavax completed Yes   Shingrix Completed?: No.    Education has been provided regarding the importance of this vaccine. Patient has been advised to call insurance company to determine out of pocket expense if they have not yet received this vaccine. Advised may also receive vaccine at local pharmacy or Health Dept. Verbalized acceptance and  understanding.  Screening Tests Health Maintenance  Topic Date Due   COVID-19 Vaccine (4 - Booster) 11/23/2020   INFLUENZA VACCINE  03/13/2021   TETANUS/TDAP  04/25/2021   Zoster Vaccines- Shingrix (1 of 2) 06/22/2021 (Originally 08/08/1970)   COLONOSCOPY (Pts 45-53yrs Insurance coverage will need to be confirmed)  03/22/2022 (Originally 07/03/2020)   MAMMOGRAM  06/30/2022   DEXA SCAN  Completed   Hepatitis C Screening  Completed   HPV VACCINES  Aged Out    Health Maintenance  Health Maintenance Due  Topic Date Due   COVID-19 Vaccine (4 - Booster) 11/23/2020   INFLUENZA VACCINE  03/13/2021   TETANUS/TDAP  04/25/2021    Colorectal cancer screening: Referral to GI placed 2022. Pt aware the office will call re: appt. She has appt set up for 06/17/21, but oncologist told her she may not be strong enough.  Mammogram status: Completed 07/06/2020. Repeat every year  Bone Density status: Completed 02/24/2020. Results reflect: Bone density results: NORMAL. Repeat every 2 years.  Lung Cancer Screening: (Low Dose CT Chest recommended if Age 64-80 years, 30 pack-year currently smoking OR have quit w/in 15years.) does not qualify.   Additional Screening:  Hepatitis C Screening: does qualify; Completed 03/01/2020  Vision Screening: Recommended annual ophthalmology exams for early detection of glaucoma and other disorders of the eye. Is the patient up to date with their annual eye exam?  Yes  Who is the provider or what is the name of the office in which the patient attends annual eye exams? [facility] If pt is not established with a provider, would they like to be referred to a provider to establish care? No .   Dental Screening: Recommended annual dental exams for proper oral hygiene  Community Resource Referral / Chronic Care Management: CRR required this visit?  No   CCM required this visit?  No      Plan:     I have personally reviewed and noted the following in the patient's  chart:   Medical and social history Use of alcohol, tobacco or illicit drugs  Current medications and supplements including opioid prescriptions.  Functional ability and status Nutritional status Physical activity Advanced directives List of other physicians Hospitalizations, surgeries, and ER visits in previous 12 months Vitals Screenings to include cognitive, depression, and falls Referrals and appointments  In addition, I have reviewed and discussed with patient certain preventive protocols, quality metrics, and best practice recommendations. A written personalized care plan for preventive services as well as general preventive health recommendations were provided to patient.     Sandrea Hammond, LPN   4/00/[id number]   Nurse Notes: None

## 2021-05-12 NOTE — Patient Instructions (Signed)
Stacie Russell , Thank you for taking time to come for your Medicare Wellness Visit. I appreciate your ongoing commitment to your health goals. Please review the following plan we discussed and let me know if I can assist you in the future.   Screening recommendations/referrals: Colonoscopy: Done 07/04/2015 - Repeat in 5 years (postponed due to chemotherapy - reschedule later) Mammogram: Done 07/06/2020 - Repeat annually  Bone Density: Done 02/24/2020 - Repeat every 2 years  Recommended yearly ophthalmology/optometry visit for glaucoma screening and checkup Recommended yearly dental visit for hygiene and checkup  Vaccinations: Influenza vaccine: Done 08/31/2020 - Repeat every fall Pneumococcal vaccine: Done 05/06/2013 & 09/05/2016 Tdap vaccine: Done 04/26/2011 - Repeat in 10 years  Shingles vaccine: Zostavax done 2012 - Shingrix discussed. Please contact your pharmacy for coverage information.     Covid-19: Done 09/14/2019, 10/05/2019, & 08/31/2020  Advanced directives: in chart  Conditions/risks identified: Aim for 30 minutes of exercise or brisk walking each day, drink 6-8 glasses of water and eat lots of fruits and vegetables.   Next appointment: Follow up in one year for your annual wellness visit    Preventive Care 65 Years and Older, Female Preventive care refers to lifestyle choices and visits with your health care provider that can promote health and wellness. What does preventive care include? A yearly physical exam. This is also called an annual well check. Dental exams once or twice a year. Routine eye exams. Ask your health care provider how often you should have your eyes checked. Personal lifestyle choices, including: Daily care of your teeth and gums. Regular physical activity. Eating a healthy diet. Avoiding tobacco and drug use. Limiting alcohol use. Practicing safe sex. Taking low-dose aspirin every day. Taking vitamin and mineral supplements as recommended by your health  care provider. What happens during an annual well check? The services and screenings done by your health care provider during your annual well check will depend on your age, overall health, lifestyle risk factors, and family history of disease. Counseling  Your health care provider may ask you questions about your: Alcohol use. Tobacco use. Drug use. Emotional well-being. Home and relationship well-being. Sexual activity. Eating habits. History of falls. Memory and ability to understand (cognition). Work and work Statistician. Reproductive health. Screening  You may have the following tests or measurements: Height, weight, and BMI. Blood pressure. Lipid and cholesterol levels. These may be checked every 5 years, or more frequently if you are over 31 years old. Skin check. Lung cancer screening. You may have this screening every year starting at age 47 if you have a 30-pack-year history of smoking and currently smoke or have quit within the past 15 years. Fecal occult blood test (FOBT) of the stool. You may have this test every year starting at age 64. Flexible sigmoidoscopy or colonoscopy. You may have a sigmoidoscopy every 5 years or a colonoscopy every 10 years starting at age 63. Hepatitis C blood test. Hepatitis B blood test. Sexually transmitted disease (STD) testing. Diabetes screening. This is done by checking your blood sugar (glucose) after you have not eaten for a while (fasting). You may have this done every 1-3 years. Bone density scan. This is done to screen for osteoporosis. You may have this done starting at age 23. Mammogram. This may be done every 1-2 years. Talk to your health care provider about how often you should have regular mammograms. Talk with your health care provider about your test results, treatment options, and if necessary, the need  for more tests. Vaccines  Your health care provider may recommend certain vaccines, such as: Influenza vaccine. This is  recommended every year. Tetanus, diphtheria, and acellular pertussis (Tdap, Td) vaccine. You may need a Td booster every 10 years. Zoster vaccine. You may need this after age 61. Pneumococcal 13-valent conjugate (PCV13) vaccine. One dose is recommended after age 2. Pneumococcal polysaccharide (PPSV23) vaccine. One dose is recommended after age 36. Talk to your health care provider about which screenings and vaccines you need and how often you need them. This information is not intended to replace advice given to you by your health care provider. Make sure you discuss any questions you have with your health care provider. Document Released: 08/26/2015 Document Revised: 04/18/2016 Document Reviewed: 05/31/2015 Elsevier Interactive Patient Education  2017 [place] Prevention in the Home Falls can cause injuries. They can happen to people of all ages. There are many things you can do to make your home safe and to help prevent falls. What can I do on the outside of my home? Regularly fix the edges of walkways and driveways and fix any cracks. Remove anything that might make you trip as you walk through a door, such as a raised step or threshold. Trim any bushes or trees on the path to your home. Use bright outdoor lighting. Clear any walking paths of anything that might make someone trip, such as rocks or tools. Regularly check to see if handrails are loose or broken. Make sure that both sides of any steps have handrails. Any raised decks and porches should have guardrails on the edges. Have any leaves, snow, or ice cleared regularly. Use sand or salt on walking paths during winter. Clean up any spills in your garage right away. This includes oil or grease spills. What can I do in the bathroom? Use night lights. Install grab bars by the toilet and in the tub and shower. Do not use towel bars as grab bars. Use non-skid mats or decals in the tub or shower. If you need to sit down in  the shower, use a plastic, non-slip stool. Keep the floor dry. Clean up any water that spills on the floor as soon as it happens. Remove soap buildup in the tub or shower regularly. Attach bath mats securely with double-sided non-slip rug tape. Do not have throw rugs and other things on the floor that can make you trip. What can I do in the bedroom? Use night lights. Make sure that you have a light by your bed that is easy to reach. Do not use any sheets or blankets that are too big for your bed. They should not hang down onto the floor. Have a firm chair that has side arms. You can use this for support while you get dressed. Do not have throw rugs and other things on the floor that can make you trip. What can I do in the kitchen? Clean up any spills right away. Avoid walking on wet floors. Keep items that you use a lot in easy-to-reach places. If you need to reach something above you, use a strong step stool that has a grab bar. Keep electrical cords out of the way. Do not use floor polish or wax that makes floors slippery. If you must use wax, use non-skid floor wax. Do not have throw rugs and other things on the floor that can make you trip. What can I do with my stairs? Do not leave any items on the stairs.  Make sure that there are handrails on both sides of the stairs and use them. Fix handrails that are broken or loose. Make sure that handrails are as long as the stairways. Check any carpeting to make sure that it is firmly attached to the stairs. Fix any carpet that is loose or worn. Avoid having throw rugs at the top or bottom of the stairs. If you do have throw rugs, attach them to the floor with carpet tape. Make sure that you have a light switch at the top of the stairs and the bottom of the stairs. If you do not have them, ask someone to add them for you. What else can I do to help prevent falls? Wear shoes that: Do not have high heels. Have rubber bottoms. Are comfortable  and fit you well. Are closed at the toe. Do not wear sandals. If you use a stepladder: Make sure that it is fully opened. Do not climb a closed stepladder. Make sure that both sides of the stepladder are locked into place. Ask someone to hold it for you, if possible. Clearly mark and make sure that you can see: Any grab bars or handrails. First and last steps. Where the edge of each step is. Use tools that help you move around (mobility aids) if they are needed. These include: Canes. Walkers. Scooters. Crutches. Turn on the lights when you go into a dark area. Replace any light bulbs as soon as they burn out. Set up your furniture so you have a clear path. Avoid moving your furniture around. If any of your floors are uneven, fix them. If there are any pets around you, be aware of where they are. Review your medicines with your doctor. Some medicines can make you feel dizzy. This can increase your chance of falling. Ask your doctor what other things that you can do to help prevent falls. This information is not intended to replace advice given to you by your health care provider. Make sure you discuss any questions you have with your health care provider. Document Released: 05/26/2009 Document Revised: 01/05/2016 Document Reviewed: 09/03/2014 Elsevier Interactive Patient Education  2017 [employer].

## 2021-05-15 ENCOUNTER — Telehealth: Payer: Self-pay

## 2021-05-15 NOTE — Telephone Encounter (Signed)
-----   Message from Heath Lark, MD sent at 05/15/2021  7:28 AM EDT ----- Urine culture did not show UTI

## 2021-05-15 NOTE — Telephone Encounter (Signed)
Called and given below message. She verbalized understanding. 

## 2021-05-18 MED FILL — Dexamethasone Sodium Phosphate Inj 100 MG/10ML: INTRAMUSCULAR | Qty: 1 | Status: AC

## 2021-05-18 MED FILL — Fosaprepitant Dimeglumine For IV Infusion 150 MG (Base Eq): INTRAVENOUS | Qty: 5 | Status: AC

## 2021-05-19 ENCOUNTER — Encounter: Payer: Self-pay | Admitting: Emergency Medicine

## 2021-05-19 ENCOUNTER — Inpatient Hospital Stay: Payer: Medicare Other | Admitting: Hematology and Oncology

## 2021-05-19 ENCOUNTER — Encounter: Payer: Self-pay | Admitting: Hematology and Oncology

## 2021-05-19 ENCOUNTER — Inpatient Hospital Stay: Payer: Medicare Other

## 2021-05-19 ENCOUNTER — Inpatient Hospital Stay: Payer: Medicare Other | Attending: Gynecologic Oncology

## 2021-05-19 ENCOUNTER — Other Ambulatory Visit: Payer: Self-pay

## 2021-05-19 VITALS — BP 139/78 | HR 84 | Temp 97.8°F | Resp 18 | Wt 222.0 lb

## 2021-05-19 DIAGNOSIS — R110 Nausea: Secondary | ICD-10-CM | POA: Insufficient documentation

## 2021-05-19 DIAGNOSIS — R531 Weakness: Secondary | ICD-10-CM | POA: Insufficient documentation

## 2021-05-19 DIAGNOSIS — C541 Malignant neoplasm of endometrium: Secondary | ICD-10-CM

## 2021-05-19 DIAGNOSIS — D61818 Other pancytopenia: Secondary | ICD-10-CM | POA: Diagnosis not present

## 2021-05-19 DIAGNOSIS — Z5111 Encounter for antineoplastic chemotherapy: Secondary | ICD-10-CM | POA: Diagnosis not present

## 2021-05-19 DIAGNOSIS — Z79899 Other long term (current) drug therapy: Secondary | ICD-10-CM | POA: Diagnosis not present

## 2021-05-19 DIAGNOSIS — Z23 Encounter for immunization: Secondary | ICD-10-CM | POA: Insufficient documentation

## 2021-05-19 DIAGNOSIS — I700 Atherosclerosis of aorta: Secondary | ICD-10-CM | POA: Insufficient documentation

## 2021-05-19 DIAGNOSIS — C775 Secondary and unspecified malignant neoplasm of intrapelvic lymph nodes: Secondary | ICD-10-CM | POA: Insufficient documentation

## 2021-05-19 LAB — CBC WITH DIFFERENTIAL (CANCER CENTER ONLY)
Abs Immature Granulocytes: 0.01 10*3/uL (ref 0.00–0.07)
Basophils Absolute: 0 10*3/uL (ref 0.0–0.1)
Basophils Relative: 0 %
Eosinophils Absolute: 0 10*3/uL (ref 0.0–0.5)
Eosinophils Relative: 0 %
HCT: 33.7 % — ABNORMAL LOW (ref 36.0–46.0)
Hemoglobin: 11.4 g/dL — ABNORMAL LOW (ref 12.0–15.0)
Immature Granulocytes: 0 %
Lymphocytes Relative: 16 %
Lymphs Abs: 0.5 10*3/uL — ABNORMAL LOW (ref 0.7–4.0)
MCH: 32.2 pg (ref 26.0–34.0)
MCHC: 33.8 g/dL (ref 30.0–36.0)
MCV: 95.2 fL (ref 80.0–100.0)
Monocytes Absolute: 0 10*3/uL — ABNORMAL LOW (ref 0.1–1.0)
Monocytes Relative: 1 %
Neutro Abs: 2.3 10*3/uL (ref 1.7–7.7)
Neutrophils Relative %: 83 %
Platelet Count: 179 10*3/uL (ref 150–400)
RBC: 3.54 MIL/uL — ABNORMAL LOW (ref 3.87–5.11)
RDW: 16.1 % — ABNORMAL HIGH (ref 11.5–15.5)
WBC Count: 2.8 10*3/uL — ABNORMAL LOW (ref 4.0–10.5)
nRBC: 0 % (ref 0.0–0.2)

## 2021-05-19 LAB — CMP (CANCER CENTER ONLY)
ALT: 18 U/L (ref 0–44)
AST: 15 U/L (ref 15–41)
Albumin: 4 g/dL (ref 3.5–5.0)
Alkaline Phosphatase: 109 U/L (ref 38–126)
Anion gap: 9 (ref 5–15)
BUN: 14 mg/dL (ref 8–23)
CO2: 26 mmol/L (ref 22–32)
Calcium: 10.1 mg/dL (ref 8.9–10.3)
Chloride: 105 mmol/L (ref 98–111)
Creatinine: 0.75 mg/dL (ref 0.44–1.00)
GFR, Estimated: >60 mL/min (ref >60)
Glucose, Bld: 161 mg/dL — ABNORMAL HIGH (ref 70–99)
Potassium: 4.1 mmol/L (ref 3.5–5.1)
Sodium: 140 mmol/L (ref 135–145)
Total Bilirubin: 0.4 mg/dL (ref 0.3–1.2)
Total Protein: 7.5 g/dL (ref 6.5–8.1)

## 2021-05-19 MED ORDER — DIPHENHYDRAMINE HCL 50 MG/ML IJ SOLN
25.0000 mg | Freq: Once | INTRAMUSCULAR | Status: AC
  Administered 2021-05-19: 25 mg via INTRAVENOUS
  Filled 2021-05-19: qty 1

## 2021-05-19 MED ORDER — SODIUM CHLORIDE 0.9% FLUSH
10.0000 mL | Freq: Once | INTRAVENOUS | Status: AC
  Administered 2021-05-19: 10 mL

## 2021-05-19 MED ORDER — SODIUM CHLORIDE 0.9 % IV SOLN: Freq: Once | INTRAVENOUS | Status: AC

## 2021-05-19 MED ORDER — PALONOSETRON HCL INJECTION 0.25 MG/5ML
0.2500 mg | Freq: Once | INTRAVENOUS | Status: AC
  Administered 2021-05-19: 0.25 mg via INTRAVENOUS

## 2021-05-19 MED ORDER — SODIUM CHLORIDE 0.9 % IV SOLN
150.0000 mg | Freq: Once | INTRAVENOUS | Status: AC
  Administered 2021-05-19: 150 mg via INTRAVENOUS
  Filled 2021-05-19: qty 150

## 2021-05-19 MED ORDER — SODIUM CHLORIDE 0.9% FLUSH
10.0000 mL | INTRAVENOUS | Status: DC | PRN
  Administered 2021-05-19: 10 mL

## 2021-05-19 MED ORDER — SODIUM CHLORIDE 0.9 % IV SOLN
531.5000 mg | Freq: Once | INTRAVENOUS | Status: AC
  Administered 2021-05-19: 530 mg via INTRAVENOUS
  Filled 2021-05-19: qty 53

## 2021-05-19 MED ORDER — SODIUM CHLORIDE 0.9 % IV SOLN
10.0000 mg | Freq: Once | INTRAVENOUS | Status: AC
  Administered 2021-05-19: 10 mg via INTRAVENOUS
  Filled 2021-05-19: qty 10

## 2021-05-19 MED ORDER — INFLUENZA VAC A&B SA ADJ QUAD 0.5 ML IM PRSY
0.5000 mL | PREFILLED_SYRINGE | Freq: Once | INTRAMUSCULAR | Status: AC
  Administered 2021-05-19: 0.5 mL via INTRAMUSCULAR
  Filled 2021-05-19: qty 0.5

## 2021-05-19 MED ORDER — SODIUM CHLORIDE 0.9 % IV SOLN
131.2500 mg/m2 | Freq: Once | INTRAVENOUS | Status: AC
  Administered 2021-05-19: 270 mg via INTRAVENOUS
  Filled 2021-05-19: qty 45

## 2021-05-19 MED ORDER — COLD PACK MISC ONCOLOGY: 1.0000 | Freq: Once | Status: DC | PRN

## 2021-05-19 MED ORDER — FAMOTIDINE 20 MG IN NS 100 ML IVPB
20.0000 mg | Freq: Once | INTRAVENOUS | Status: AC
  Administered 2021-05-19: 20 mg via INTRAVENOUS

## 2021-05-19 MED ORDER — PALONOSETRON HCL INJECTION 0.25 MG/5ML
INTRAVENOUS | Status: AC
  Filled 2021-05-19: qty 5

## 2021-05-19 MED ORDER — FAMOTIDINE 20 MG IN NS 100 ML IVPB
INTRAVENOUS | Status: AC
  Filled 2021-05-19: qty 100

## 2021-05-19 MED ORDER — HEPARIN SOD (PORK) LOCK FLUSH 100 UNIT/ML IV SOLN
500.0000 [IU] | Freq: Once | INTRAVENOUS | Status: AC | PRN
  Administered 2021-05-19: 500 [IU]

## 2021-05-19 NOTE — Research (Signed)
A Randomized pragmatic Chair-Based Home Exercise Intervention for Mitigating Cancer-Related Fatigue in Older Adults Undergoing Chemotherapy for Advanced Disease  05/19/21 - Week 12 Visit  The patient presented today for her visit with Dr. Alvy Bimler and chemotherapy infusion.  PROs:  The week 12 PROs including PROMIS-F and CES-D were completed by the patient in the clinic today and checked for completeness by this clinical research coordinator.  SPPB:  The SPPB including balance test, gait speed test, and chair stand and repeated chair stand was performed by this coordinator per protocol.  Gift Card:  The patient was given two $25 [employer] gift cards ($50 total) for completion of the required week 12 assessments.  Exercise Education:  The patient was provided the exercise brochure and exercise education per protocol.  The patient was thanked for her time participation in this study.   [name] Clinical Research Coordinator I  05/19/21 2:13 PM

## 2021-05-19 NOTE — Assessment & Plan Note (Signed)
She has generalized weakness by day 3 after each cycle of treatment that coincide with withdrawal from dexamethasone She has my permission to take extra doses of oral dexamethasone as needed for few days after chemotherapy She is also participating in research study about role of exercise I recommend referral to physical therapy and rehab in the future

## 2021-05-19 NOTE — Assessment & Plan Note (Signed)
She have slight progressive pancytopenia due to treatment I plan to reduce the dose of chemotherapy a little bit

## 2021-05-19 NOTE — Assessment & Plan Note (Signed)
She felt the IV fluids after treatment was helpful We will schedule IV fluid support with each cycle of treatment

## 2021-05-19 NOTE — Progress Notes (Signed)
[facility] OFFICE PROGRESS NOTE  Patient Care Team: Janora Norlander, DO as PCP - General (Family Medicine) Lorretta Harp, MD as PCP - Cardiology (Cardiology) Irine Seal, MD (Urology) Paula Compton, MD (Obstetrics and Gynecology) Gatha Mayer, MD (Gastroenterology) Gaynelle Arabian, MD as Consulting Physician (Orthopedic Surgery)  ASSESSMENT & PLAN:  Endometrial cancer [place]) Stacie Russell tolerated last cycle of therapy better with aggressive IV fluid support and laxative Stacie Russell had minimum side effects from treatment so far We will proceed with treatment as scheduled with dose reduction due to progressive pancytopenia I plan to reduce carboplatin to AUC of 5 I will try to schedule IV fluids 2 days after treatment Recommend repeat CT imaging in a month from now for objective assessment of response to therapy  Pancytopenia, acquired ([place]) Stacie Russell have slight progressive pancytopenia due to treatment I plan to reduce the dose of chemotherapy a little bit  Nausea without vomiting Stacie Russell felt the IV fluids after treatment was helpful We will schedule IV fluid support with each cycle of treatment  Generalized weakness Stacie Russell has generalized weakness by day 3 after each cycle of treatment that coincide with withdrawal from dexamethasone Stacie Russell has my permission to take extra doses of oral dexamethasone as needed for few days after chemotherapy Stacie Russell is also participating in research study about role of exercise I recommend referral to physical therapy and rehab in the future  Orders Placed This Encounter  Procedures   CT ABDOMEN PELVIS W CONTRAST    Standing Status:   Future    Standing Expiration Date:   05/19/2022    Order Specific Question:   If indicated for the ordered procedure, I authorize the administration of contrast media per Radiology protocol    Answer:   Yes    Order Specific Question:   Preferred imaging location?    Answer:   [facility]    Order Specific  Question:   Radiology Contrast Protocol - do NOT remove file path    Answer:   [email or web address]    All questions were answered. The patient knows to call the clinic with any problems, questions or concerns. The total time spent in the appointment was 30 minutes encounter with patients including review of chart and various tests results, discussions about plan of care and coordination of care plan   Heath Lark, MD 05/19/2021 10:50 AM  INTERVAL HISTORY: Please see below for problem oriented charting. Stacie Russell returns for treatment follow-up for cycle 6 of carboplatin and paclitaxel for adjuvant treatment for uterine cancer Stacie Russell continues to have problems with oral intake for few days after treatment Denies recent nausea vomiting Stacie Russell had constipation for few days after treatment No worsening peripheral neuropathy Stacie Russell complains of generalized weakness  REVIEW OF SYSTEMS:   Constitutional: Denies fevers, chills or abnormal weight loss Eyes: Denies blurriness of vision Ears, nose, mouth, throat, and face: Denies mucositis or sore throat Respiratory: Denies cough, dyspnea or wheezes Cardiovascular: Denies palpitation, chest discomfort or lower extremity swelling Skin: Denies abnormal skin rashes Lymphatics: Denies new lymphadenopathy or easy bruising Behavioral/Psych: Mood is stable, no new changes  All other systems were reviewed with the patient and are negative.  I have reviewed the past medical history, past surgical history, social history and family history with the patient and they are unchanged from previous note.  ALLERGIES:  is allergic to tramadol.  MEDICATIONS:  Current Outpatient Medications  Medication Sig Dispense Refill   acetaminophen (TYLENOL) 500 MG tablet Take 1,000 mg  by mouth as needed (pain.).     albuterol (VENTOLIN HFA) 108 (90 Base) MCG/ACT inhaler Inhale 2 puffs into the lungs every 6 (six) hours as needed for wheezing or  shortness of breath. (Patient taking differently: Inhale 2 puffs into the lungs as needed for wheezing or shortness of breath. PRN) 6.7 g 1   budesonide-formoterol (SYMBICORT) 80-4.5 MCG/ACT inhaler Inhale 2 puffs into the lungs 2 (two) times daily. (Patient taking differently: Inhale 2 puffs into the lungs as needed (respiratory issues.).) 4 each 11   Calcium Carbonate-Vit D-Min (CALCIUM 1200 PO) Take 1 tablet by mouth in the morning.     Cholecalciferol (VITAMIN D3) 2000 units TABS Take 2,000 Units by mouth in the morning.     dexamethasone (DECADRON) 4 MG tablet Take 2 tabs at the night before and 2 tab the morning of chemotherapy, every 3 weeks, by mouth x 6 cycles 36 tablet 6   docusate sodium (COLACE) 100 MG capsule Take 100 mg by mouth 2 (two) times daily as needed (constipation.).     DULoxetine (CYMBALTA) 30 MG capsule Take 1 capsule (30 mg total) by mouth daily. (Patient taking differently: Take 30 mg by mouth in the morning.) 90 capsule 3   esomeprazole (NEXIUM) 40 MG capsule Take 1 capsule (40 mg total) by mouth daily before breakfast. 90 capsule 3   furosemide (LASIX) 20 MG tablet Take 1 tablet (20 mg total) by mouth daily as needed for fluid. (Patient taking differently: Take 20 mg by mouth as needed for fluid. PRN) 90 tablet 1   lactulose (CHRONULAC) 10 GM/15ML solution Take 15 mLs (10 g total) by mouth 3 (three) times daily. 473 mL 0   levothyroxine (SYNTHROID) 50 MCG tablet TAKE 1 TABLET BY MOUTH  DAILY BEFORE BREAKFAST (Patient taking differently: Take 50 mcg by mouth daily before breakfast.) 90 tablet 3   lidocaine-prilocaine (EMLA) cream Apply to affected area once 30 g 3   losartan-hydrochlorothiazide (HYZAAR) 100-12.5 MG tablet Take 1 tablet by mouth daily. (Patient taking differently: Take 1 tablet by mouth in the morning.) 90 tablet 3   Omega-3 Fatty Acids (FISH OIL) 1000 MG CAPS Take 1,000 mg by mouth in the morning.     ondansetron (ZOFRAN) 8 MG tablet Take 1 tablet (8 mg  total) by mouth 2 (two) times daily as needed. Start on the third day after chemotherapy. 30 tablet 1   oxybutynin (DITROPAN) 5 MG tablet Take 2 tablets (10 mg total) by mouth 2 (two) times daily. 360 tablet 3   Plecanatide (TRULANCE) 3 MG TABS Take 1 tablet by mouth daily. 8 tablet 0   prochlorperazine (COMPAZINE) 10 MG tablet Take 1 tablet (10 mg total) by mouth every 6 (six) hours as needed (Nausea or vomiting). 30 tablet 1   valACYclovir (VALTREX) 1000 MG tablet Take 1 tablet twice daily for 7 days.  If you have another flare, take 2 tablets twice a day for ONE day only per flareup. (Patient taking differently: Take 500 mg by mouth 2 (two) times daily as needed (fever blisters). PRN) 30 tablet 0   vitamin B-12 (CYANOCOBALAMIN) 1000 MCG tablet Take 1,000 mcg by mouth in the morning.     No current facility-administered medications for this visit.   Facility-Administered Medications Ordered in Other Visits  Medication Dose Route Frequency Provider Last Rate Last Admin   CARBOplatin (PARAPLATIN) 530 mg in sodium chloride 0.9 % 250 mL chemo infusion  530 mg Intravenous Once Heath Lark, MD  Cold Pack 1 packet  1 packet Topical Once PRN Alvy Bimler, Muzammil Bruins, MD       dexamethasone (DECADRON) 10 mg in sodium chloride 0.9 % 50 mL IVPB  10 mg Intravenous Once Alvy Bimler, Dima Ferrufino, MD       famotidine (PEPCID) 20 mg IVPB            famotidine (PEPCID) IVPB 20 mg in NS 100 mL IVPB  20 mg Intravenous Once Alvy Bimler, Cutberto Winfree, MD       fosaprepitant (EMEND) 150 mg in sodium chloride 0.9 % 145 mL IVPB  150 mg Intravenous Once Alvy Bimler, Ariella Voit, MD       heparin lock flush 100 unit/mL  500 Units Intracatheter Once PRN Alvy Bimler, Daren Yeagle, MD       PACLitaxel (TAXOL) 270 mg in sodium chloride 0.9 % 250 mL chemo infusion (> 4m/m2)  131.25 mg/m2 (Treatment Plan Recorded) Intravenous Once GAlvy Bimler Prestin Munch, MD       palonosetron (ALOXI) 0.25 MG/5ML injection            sodium chloride flush (NS) 0.9 % injection 10 mL  10 mL Intracatheter PRN  GHeath Lark MD        SUMMARY OF ONCOLOGIC HISTORY: Oncology History Overview Note  MSI stable, endometrioid grade 2 Neg genetics   [place]ometrial [place]  12/19/2020 Initial Diagnosis   [place]ometrial cancer ([place]   12/26/2020 Imaging   1. Lower uterine segment/cervical mass without evidence of metastatic disease. 2. Hepatic steatosis. 3. Aortic atherosclerosis (ICD10-I70.0). Coronary artery calcification.   12/29/2020 Pathology Results   FINAL MICROSCOPIC DIAGNOSIS:   A. SENTINEL LYMPH NODE, RIGHT OBTURATOR, EXCISION:  -  No carcinoma identified in one lymph node (0/1)  -  See comment   B. 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 UTERUS, CERVIX, BILATERAL TUBES AND OVARIES:  Uterus:  -  Endometrioid carcinoma, FIGO grade 2  -  See oncology table and comment below   Cervix:  -  No carcinoma identified   Bilateral Ovaries:  -  No carcinoma identified   Bilateral Fallopian tubes:  -  No carcinoma identified   ONCOLOGY TABLE:   UTERUS, CARCINOMA OR CARCINOSARCOMA: Resection   Procedure: Total hysterectomy and bilateral salpingo-oophorectomy  Histologic Type: Endometrioid carcinoma  Histologic Grade: FIGO grade 2  Myometrial Invasion:       Percentage of Myometrial Invasion: Estimated to be less than 50%  Uterine Serosa Involvement: Not identified  Cervical stromal Involvement: Not identified  Extent of involvement of other tissue/organs: Not identified  Peritoneal/Ascitic Fluid: Not submitted/unknown  Lymphovascular Invasion: Not identified  Regional Lymph Nodes:       Pelvic Lymph Nodes Examined:  2 Sentinel                                0 non-sentinel                                   2 total       Pelvic Lymph Nodes with Metastasis: 0                           Macrometastasis: (>2.0 mm): 0  Micrometastasis: (>0.2 mm and < 2.0 mm): 1                             Isolated Tumor Cells (<0.2 mm): 0                       Laterality of Lymph Node with Tumor: 0                             Extracapsular Extension: N/A  Distant Metastasis:       Distant Site(s) Involved: N/A  Pathologic Stage Classification (pTNM, AJCC 8th Edition): pT1a, pN66m  Ancillary Studies: MMR / MSI testing will be ordered  Representative Tumor Block: C1  Comment(s): Cytokeratin AE1/3 was performed on the lymph nodes (parts a and B) and supports the presence of micrometastasis (part B only). Drs. PSaralyn Pilarand CBrodstone Memorial Hospreviewed select parts of the case and agree with the above diagnosis.   (v4.2.0.1)     12/29/2020 Surgery   Surgeon: RDonaciano EvaPre-operative Diagnosis: endometrial cancer grade 3    Operation: Robotic-assisted laparoscopic total hysterectomy with bilateral salpingoophorectomy, SLN biopsy   Surgeon: RDonaciano Eva Specimens: uterus, cervix, bilateral tubes and ovaries, right and left obturator SLN           01/22/2021 Cancer Staging   Staging form: Corpus Uteri - Carcinoma and Carcinosarcoma, AJCC 8th Edition - Pathologic stage from 01/22/2021: Stage IIIC1 (pT1a, pN161m cM0) - Signed by GoHeath LarkMD on 01/22/2021 Stage prefix: Initial diagnosis   01/25/2021 Procedure   Successful placement of a right IJ approach Power Port with ultrasound and fluoroscopic guidance. The catheter is ready for use.   02/07/2021 -  Chemotherapy   Patient is on Treatment Plan : UTERINE Carboplatin AUC 6 / Paclitaxel q21d     02/11/2021 Genetic Testing   Negative hereditary cancer genetic testing: no pathogenic variants detected in [facility] Common Hereditary Cancers +RNA Panel.  The report date is February 11, 2021.    The Common Hereditary Cancers + RNA Panel offered by [facility] includes sequencing, deletion/duplication, and RNA testing of the following 47 genes: APC, ATM, AXIN2, BARD1, BMPR1A, BRCA1, BRCA2, BRIP1, CDH1, CDK4*, CDKN2A (p14ARF)*, CDKN2A (p16INK4a)*,  CHEK2, CTNNA1, DICER1, EPCAM (Deletion/duplication testing only), GREM1 (promoter region deletion/duplication testing only), KIT, MEN1, MLH1, MSH2, MSH3, MSH6, MUTYH, NBN, NF1, NHTL1, PALB2, PDGFRA*, PMS2, POLD1, POLE, PTEN, RAD50, RAD51C, RAD51D, SDHB, SDHC, SDHD, SMAD4, SMARCA4. STK11, TP53, TSC1, TSC2, and VHL.  The following genes were evaluated for sequence changes only: SDHA and HOXB13 c.251G>A variant only.  RNA analysis is not performed for the * genes.       PHYSICAL EXAMINATION: ECOG PERFORMANCE STATUS: 2 - Symptomatic, <50% confined to bed  Vitals:   05/19/21 1005  BP: 139/78  Pulse: 84  Resp: 18  Temp: 97.8 F (36.6 C)  SpO2: 95%   Filed Weights   05/19/21 1005  Weight: 222 lb (100.7 kg)    GENERAL:alert, no distress and comfortable SKIN: skin color, texture, turgor are normal, no rashes or significant lesions EYES: normal, Conjunctiva are pink and non-injected, sclera clear OROPHARYNX:no exudate, no erythema and lips, buccal mucosa, and tongue normal  NECK: supple, thyroid normal size, non-tender, without nodularity LYMPH:  no palpable lymphadenopathy in the cervical, axillary or inguinal LUNGS: clear to auscultation and percussion with normal breathing effort HEART: regular rate & rhythm and  no murmurs and no lower extremity edema ABDOMEN:abdomen soft, non-tender and normal bowel sounds Musculoskeletal:no cyanosis of digits and no clubbing  NEURO: alert & oriented x 3 with fluent speech, no focal motor/sensory deficits  LABORATORY DATA:  I have reviewed the data as listed    Component Value Date/Time   NA 140 05/19/2021 0950   NA 141 08/31/2020 1149   K 4.1 05/19/2021 0950   CL 105 05/19/2021 0950   CO2 26 05/19/2021 0950   GLUCOSE 161 (H) 05/19/2021 0950   BUN 14 05/19/2021 0950   BUN 15 08/31/2020 1149   CREATININE 0.75 05/19/2021 0950   CREATININE 0.60 11/03/2012 1228   CALCIUM 10.1 05/19/2021 0950   PROT 7.5 05/19/2021 0950   PROT 7.3 08/31/2020 1149    ALBUMIN 4.0 05/19/2021 0950   ALBUMIN 4.7 08/31/2020 1149   AST 15 05/19/2021 0950   ALT 18 05/19/2021 0950   ALKPHOS 109 05/19/2021 0950   BILITOT 0.4 05/19/2021 0950   GFRNONAA >60 05/19/2021 0950   GFRAA 91 08/31/2020 1149    No results found for: SPEP, UPEP  Lab Results  Component Value Date   WBC 2.8 (L) 05/19/2021   NEUTROABS 2.3 05/19/2021   HGB 11.4 (L) 05/19/2021   HCT 33.7 (L) 05/19/2021   MCV 95.2 05/19/2021   PLT 179 05/19/2021      Chemistry      Component Value Date/Time   NA 140 05/19/2021 0950   NA 141 08/31/2020 1149   K 4.1 05/19/2021 0950   CL 105 05/19/2021 0950   CO2 26 05/19/2021 0950   BUN 14 05/19/2021 0950   BUN 15 08/31/2020 1149   CREATININE 0.75 05/19/2021 0950   CREATININE 0.60 11/03/2012 1228   GLU 81 07/24/2012 0000      Component Value Date/Time   CALCIUM 10.1 05/19/2021 0950   ALKPHOS 109 05/19/2021 0950   AST 15 05/19/2021 0950   ALT 18 05/19/2021 0950   BILITOT 0.4 05/19/2021 0950

## 2021-05-19 NOTE — Patient Instructions (Signed)
[facility] ONCOLOGY  Discharge Instructions: Thank you for choosing [facility] to provide your oncology and hematology care.   If you have a lab appointment with the [facility], please go directly to the [facility] and check in at the registration area.   Wear comfortable clothing and clothing appropriate for easy access to any Portacath or PICC line.   We strive to give you quality time with your provider. You may need to reschedule your appointment if you arrive late (15 or more minutes).  Arriving late affects you and other patients whose appointments are after yours.  Also, if you miss three or more appointments without notifying the office, you may be dismissed from the clinic at the provider's discretion.      For prescription refill requests, have your pharmacy contact our office and allow 72 hours for refills to be completed.    Today you received the following chemotherapy and/or immunotherapy agents: Paclitaxel (Taxol) and Carboplatin.   To help prevent nausea and vomiting after your treatment, we encourage you to take your nausea medication as directed.  BELOW ARE SYMPTOMS THAT SHOULD BE REPORTED IMMEDIATELY: *FEVER GREATER THAN 100.4 F (38 C) OR HIGHER *CHILLS OR SWEATING *NAUSEA AND VOMITING THAT IS NOT CONTROLLED WITH YOUR NAUSEA MEDICATION *UNUSUAL SHORTNESS OF BREATH *UNUSUAL BRUISING OR BLEEDING *URINARY PROBLEMS (pain or burning when urinating, or frequent urination) *BOWEL PROBLEMS (unusual diarrhea, constipation, pain near the anus) TENDERNESS IN MOUTH AND THROAT WITH OR WITHOUT PRESENCE OF ULCERS (sore throat, sores in mouth, or a toothache) UNUSUAL RASH, SWELLING OR PAIN  UNUSUAL VAGINAL DISCHARGE OR ITCHING   Items with * indicate a potential emergency and should be followed up as soon as possible or go to the Emergency Department if any problems should occur.  Please show the CHEMOTHERAPY ALERT CARD or IMMUNOTHERAPY  ALERT CARD at check-in to the Emergency Department and triage nurse.  Should you have questions after your visit or need to cancel or reschedule your appointment, please contact [facility]  Dept: [phone]  and follow the prompts.  Office hours are 8:00 a.m. to 4:30 p.m. Monday - Friday. Please note that voicemails left after 4:00 p.m. may not be returned until the following business day.  We are closed weekends and major holidays. You have access to a nurse at all times for urgent questions. Please call the main number to the clinic Dept: [phone] and follow the prompts.   For any non-urgent questions, you may also contact your provider using MyChart. We now offer e-Visits for anyone 72 and older to request care online for non-urgent symptoms. For details visit [email or web address].   Also download the MyChart app! Go to the app store, search "MyChart", open the app, select [facility], and log in with your MyChart username and password.  Due to Covid, a mask is required upon entering the hospital/clinic. If you do not have a mask, one will be given to you upon arrival. For doctor visits, patients may have 1 support person aged 53 or older with them. For treatment visits, patients cannot have anyone with them due to current Covid guidelines and our immunocompromised population.  Rehydration, Adult Rehydration is the replacement of body fluids, salts, and minerals (electrolytes) that are lost during dehydration. Dehydration is when there is not enough water or other fluids in the body. This happens when you lose more fluids than you take in. Common causes of dehydration include: Not drinking  enough fluids. This can occur when you are ill or doing activities that require a lot of energy, especially in hot weather. Conditions that cause loss of water or other fluids, such as diarrhea, vomiting, sweating, or urinating a lot. Other illnesses, such as fever or  infection. Certain medicines, such as those that remove excess fluid from the body (diuretics). Symptoms of mild or moderate dehydration may include thirst, dry lips and mouth, and dizziness. Symptoms of severe dehydration may include increased heart rate, confusion, fainting, and not urinating. For severe dehydration, you may need to get fluids through an IV at the hospital. For mild or moderate dehydration, you can usually rehydrate at home by drinking certain fluids as told by your health care provider. What are the risks? Generally, rehydration is safe. However, taking in too much fluid (overhydration) can be a problem. This is rare. Overhydration can cause an electrolyte imbalance, kidney failure, or a decrease in salt (sodium) levels in the body. Supplies needed You will need an oral rehydration solution (ORS) if your health care provider tells you to use one. This is a drink to treat dehydration. It can be found in pharmacies and retail stores. How to rehydrate Fluids Follow instructions from your health care provider for rehydration. The kind of fluid and the amount you should drink depend on your condition. In general, you should choose drinks that you prefer. If told by your health care provider, drink an ORS. Make an ORS by following instructions on the package. Start by drinking small amounts, about  cup (120 mL) every 5-10 minutes. Slowly increase how much you drink until you have taken the amount recommended by your health care provider. Drink enough clear fluids to keep your urine pale yellow. If you were told to drink an ORS, finish it first, then start slowly drinking other clear fluids. Drink fluids such as: Water. This includes sparkling water and flavored water. Drinking only water can lead to having too little sodium in your body (hyponatremia). Follow the advice of your health care provider. Water from ice chips you suck on. Fruit juice with water you add to it  (diluted). Sports drinks. Hot or cold herbal teas. Broth-based soups. Milk or milk products. Food Follow instructions from your health care provider about what to eat while you rehydrate. Your health care provider may recommend that you slowly begin eating regular foods in small amounts. Eat foods that contain a healthy balance of electrolytes, such as bananas, oranges, potatoes, tomatoes, and spinach. Avoid foods that are greasy or contain a lot of sugar. In some cases, you may get nutrition through a feeding tube that is passed through your nose and into your stomach (nasogastric tube, or NG tube). This may be done if you have uncontrolled vomiting or diarrhea. Beverages to avoid Certain beverages may make dehydration worse. While you rehydrate, avoid drinking alcohol. How to tell if you are recovering from dehydration You may be recovering from dehydration if: You are urinating more often than before you started rehydrating. Your urine is pale yellow. Your energy level improves. You vomit less frequently. You have diarrhea less frequently. Your appetite improves or returns to normal. You feel less dizzy or less light-headed. Your skin tone and color start to look more normal. Follow these instructions at home: Take over-the-counter and prescription medicines only as told by your health care provider. Do not take sodium tablets. Doing this can lead to having too much sodium in your body (hypernatremia). Contact a health care  provider if: You continue to have symptoms of mild or moderate dehydration, such as: Thirst. Dry lips. Slightly dry mouth. Dizziness. Dark urine or less urine than normal. Muscle cramps. You continue to vomit or have diarrhea. Get help right away if you: Have symptoms of dehydration that get worse. Have a fever. Have a severe headache. Have been vomiting and the following happens: Your vomiting gets worse or does not go away. Your vomit includes blood or  green matter (bile). You cannot eat or drink without vomiting. Have problems with urination or bowel movements, such as: Diarrhea that gets worse or does not go away. Blood in your stool (feces). This may cause stool to look black and tarry. Not urinating, or urinating only a small amount of very dark urine, within 6-8 hours. Have trouble breathing. Have symptoms that get worse with treatment. These symptoms may represent a serious problem that is an emergency. Do not wait to see if the symptoms will go away. Get medical help right away. Call your local emergency services (911 in the [place] Do not drive yourself to the hospital. Summary Rehydration is the replacement of body fluids and minerals (electrolytes) that are lost during dehydration. Follow instructions from your health care provider for rehydration. The kind of fluid and amount you should drink depend on your condition. Slowly increase how much you drink until you have taken the amount recommended by your health care provider. Contact your health care provider if you continue to show signs of mild or moderate dehydration. This information is not intended to replace advice given to you by your health care provider. Make sure you discuss any questions you have with your health care provider. Document Revised: 09/30/2019 Document Reviewed: 08/10/2019 Elsevier Patient Education  2022 [employer].

## 2021-05-19 NOTE — Assessment & Plan Note (Signed)
She tolerated last cycle of therapy better with aggressive IV fluid support and laxative She had minimum side effects from treatment so far We will proceed with treatment as scheduled with dose reduction due to progressive pancytopenia I plan to reduce carboplatin to AUC of 5 I will try to schedule IV fluids 2 days after treatment Recommend repeat CT imaging in a month from now for objective assessment of response to therapy

## 2021-05-22 ENCOUNTER — Other Ambulatory Visit: Payer: Self-pay

## 2021-05-22 ENCOUNTER — Ambulatory Visit: Payer: Medicare Other

## 2021-05-22 ENCOUNTER — Inpatient Hospital Stay: Payer: Medicare Other

## 2021-05-22 VITALS — BP 145/66 | HR 83 | Temp 98.5°F | Resp 18

## 2021-05-22 DIAGNOSIS — Z5111 Encounter for antineoplastic chemotherapy: Secondary | ICD-10-CM | POA: Diagnosis not present

## 2021-05-22 DIAGNOSIS — D61818 Other pancytopenia: Secondary | ICD-10-CM | POA: Diagnosis not present

## 2021-05-22 DIAGNOSIS — I700 Atherosclerosis of aorta: Secondary | ICD-10-CM | POA: Diagnosis not present

## 2021-05-22 DIAGNOSIS — R110 Nausea: Secondary | ICD-10-CM | POA: Diagnosis not present

## 2021-05-22 DIAGNOSIS — R531 Weakness: Secondary | ICD-10-CM | POA: Diagnosis not present

## 2021-05-22 DIAGNOSIS — Z23 Encounter for immunization: Secondary | ICD-10-CM | POA: Diagnosis not present

## 2021-05-22 DIAGNOSIS — Z79899 Other long term (current) drug therapy: Secondary | ICD-10-CM | POA: Diagnosis not present

## 2021-05-22 DIAGNOSIS — C541 Malignant neoplasm of endometrium: Secondary | ICD-10-CM

## 2021-05-22 DIAGNOSIS — C775 Secondary and unspecified malignant neoplasm of intrapelvic lymph nodes: Secondary | ICD-10-CM | POA: Diagnosis not present

## 2021-05-22 MED ORDER — SODIUM CHLORIDE 0.9% FLUSH
10.0000 mL | Freq: Once | INTRAVENOUS | Status: AC | PRN
  Administered 2021-05-22: 10 mL

## 2021-05-22 MED ORDER — SODIUM CHLORIDE 0.9 % IV SOLN: Freq: Once | INTRAVENOUS | Status: AC

## 2021-05-22 MED ORDER — HEPARIN SOD (PORK) LOCK FLUSH 100 UNIT/ML IV SOLN
500.0000 [IU] | Freq: Once | INTRAVENOUS | Status: AC | PRN
  Administered 2021-05-22: 500 [IU]

## 2021-05-22 NOTE — Patient Instructions (Signed)

## 2021-06-13 ENCOUNTER — Ambulatory Visit (HOSPITAL_COMMUNITY)
Admission: RE | Admit: 2021-06-13 | Discharge: 2021-06-13 | Disposition: A | Discharge status: Home / Self Care | Payer: Medicare Other | Source: Ambulatory Visit | Attending: Hematology and Oncology | Admitting: Hematology and Oncology

## 2021-06-13 ENCOUNTER — Inpatient Hospital Stay: Payer: Medicare Other | Attending: Gynecologic Oncology

## 2021-06-13 ENCOUNTER — Other Ambulatory Visit: Payer: Self-pay | Admitting: Family Medicine

## 2021-06-13 ENCOUNTER — Other Ambulatory Visit: Payer: Self-pay

## 2021-06-13 DIAGNOSIS — Z885 Allergy status to narcotic agent status: Secondary | ICD-10-CM | POA: Insufficient documentation

## 2021-06-13 DIAGNOSIS — Z7951 Long term (current) use of inhaled steroids: Secondary | ICD-10-CM | POA: Insufficient documentation

## 2021-06-13 DIAGNOSIS — M47816 Spondylosis without myelopathy or radiculopathy, lumbar region: Secondary | ICD-10-CM | POA: Insufficient documentation

## 2021-06-13 DIAGNOSIS — K449 Diaphragmatic hernia without obstruction or gangrene: Secondary | ICD-10-CM | POA: Insufficient documentation

## 2021-06-13 DIAGNOSIS — R531 Weakness: Secondary | ICD-10-CM | POA: Diagnosis not present

## 2021-06-13 DIAGNOSIS — D61818 Other pancytopenia: Secondary | ICD-10-CM | POA: Insufficient documentation

## 2021-06-13 DIAGNOSIS — I2510 Atherosclerotic heart disease of native coronary artery without angina pectoris: Secondary | ICD-10-CM | POA: Insufficient documentation

## 2021-06-13 DIAGNOSIS — K760 Fatty (change of) liver, not elsewhere classified: Secondary | ICD-10-CM | POA: Diagnosis not present

## 2021-06-13 DIAGNOSIS — C541 Malignant neoplasm of endometrium: Secondary | ICD-10-CM | POA: Insufficient documentation

## 2021-06-13 DIAGNOSIS — I700 Atherosclerosis of aorta: Secondary | ICD-10-CM | POA: Diagnosis not present

## 2021-06-13 DIAGNOSIS — M2578 Osteophyte, vertebrae: Secondary | ICD-10-CM | POA: Diagnosis not present

## 2021-06-13 DIAGNOSIS — Z888 Allergy status to other drugs, medicaments and biological substances status: Secondary | ICD-10-CM | POA: Diagnosis not present

## 2021-06-13 LAB — CBC WITH DIFFERENTIAL (CANCER CENTER ONLY)
Abs Immature Granulocytes: 0.01 10*3/uL (ref 0.00–0.07)
Basophils Absolute: 0 10*3/uL (ref 0.0–0.1)
Basophils Relative: 0 %
Eosinophils Absolute: 0.1 10*3/uL (ref 0.0–0.5)
Eosinophils Relative: 2 %
HCT: 32.4 % — ABNORMAL LOW (ref 36.0–46.0)
Hemoglobin: 10.8 g/dL — ABNORMAL LOW (ref 12.0–15.0)
Immature Granulocytes: 0 %
Lymphocytes Relative: 31 %
Lymphs Abs: 1.3 10*3/uL (ref 0.7–4.0)
MCH: 32.9 pg (ref 26.0–34.0)
MCHC: 33.3 g/dL (ref 30.0–36.0)
MCV: 98.8 fL (ref 80.0–100.0)
Monocytes Absolute: 0.4 10*3/uL (ref 0.1–1.0)
Monocytes Relative: 11 %
Neutro Abs: 2.3 10*3/uL (ref 1.7–7.7)
Neutrophils Relative %: 56 %
Platelet Count: 153 10*3/uL (ref 150–400)
RBC: 3.28 MIL/uL — ABNORMAL LOW (ref 3.87–5.11)
RDW: 15 % (ref 11.5–15.5)
WBC Count: 4.1 10*3/uL (ref 4.0–10.5)
nRBC: 0 % (ref 0.0–0.2)

## 2021-06-13 LAB — CMP (CANCER CENTER ONLY)
ALT: 15 U/L (ref 0–44)
AST: 14 U/L — ABNORMAL LOW (ref 15–41)
Albumin: 3.6 g/dL (ref 3.5–5.0)
Alkaline Phosphatase: 100 U/L (ref 38–126)
Anion gap: 8 (ref 5–15)
BUN: 10 mg/dL (ref 8–23)
CO2: 28 mmol/L (ref 22–32)
Calcium: 9.2 mg/dL (ref 8.9–10.3)
Chloride: 104 mmol/L (ref 98–111)
Creatinine: 0.67 mg/dL (ref 0.44–1.00)
GFR, Estimated: >60 mL/min (ref >60)
Glucose, Bld: 93 mg/dL (ref 70–99)
Potassium: 3.8 mmol/L (ref 3.5–5.1)
Sodium: 140 mmol/L (ref 135–145)
Total Bilirubin: 0.3 mg/dL (ref 0.3–1.2)
Total Protein: 6.6 g/dL (ref 6.5–8.1)

## 2021-06-13 MED ORDER — IOHEXOL 350 MG/ML SOLN
80.0000 mL | Freq: Once | INTRAVENOUS | Status: AC | PRN
  Administered 2021-06-13: 80 mL via INTRAVENOUS

## 2021-06-13 MED ORDER — HEPARIN SOD (PORK) LOCK FLUSH 100 UNIT/ML IV SOLN
INTRAVENOUS | Status: AC
  Filled 2021-06-13: qty 5

## 2021-06-13 MED ORDER — HEPARIN SOD (PORK) LOCK FLUSH 100 UNIT/ML IV SOLN
500.0000 [IU] | Freq: Once | INTRAVENOUS | Status: AC
  Administered 2021-06-13: 500 [IU] via INTRAVENOUS

## 2021-06-13 NOTE — Patient Instructions (Signed)
Implanted Port Home Guide An implanted port is a device that is placed under the skin. It is usually placed in the chest. The device can be used to give IV medicine, to take blood, or for dialysis. You may have an implanted port if: You need IV medicine that would be irritating to the small veins in your hands or arms. You need IV medicines, such as antibiotics, for a long period of time. You need IV nutrition for a long period of time. You need dialysis. When you have a port, your health care provider can choose to use the port instead of veins in your arms for these procedures. You may have fewer limitations when using a port than you would if you used other types of long-term IVs, and you will likely be able to return to normal activities after your incision heals. An implanted port has two main parts: Reservoir. The reservoir is the part where a needle is inserted to give medicines or draw blood. The reservoir is round. After it is placed, it appears as a small, raised area under your skin. Catheter. The catheter is a thin, flexible tube that connects the reservoir to a vein. Medicine that is inserted into the reservoir goes into the catheter and then into the vein. How is my port accessed? To access your port: A numbing cream may be placed on the skin over the port site. Your health care provider will put on a mask and sterile gloves. The skin over your port will be cleaned carefully with a germ-killing soap and allowed to dry. Your health care provider will gently pinch the port and insert a needle into it. Your health care provider will check for a blood return to make sure the port is in the vein and is not clogged. If your port needs to remain accessed to get medicine continuously (constant infusion), your health care provider will place a clear bandage (dressing) over the needle site. The dressing and needle will need to be changed every week, or as told by your health care provider. What  is flushing? Flushing helps keep the port from getting clogged. Follow instructions from your health care provider about how and when to flush the port. Ports are usually flushed with saline solution or a medicine called heparin. The need for flushing will depend on how the port is used: If the port is only used from time to time to give medicines or draw blood, the port may need to be flushed: Before and after medicines have been given. Before and after blood has been drawn. As part of routine maintenance. Flushing may be recommended every 4-6 weeks. If a constant infusion is running, the port may not need to be flushed. Throw away any syringes in a disposal container that is meant for sharp items (sharps container). You can buy a sharps container from a pharmacy, or you can make one by using an empty hard plastic bottle with a cover. How long will my port stay implanted? The port can stay in for as long as your health care provider thinks it is needed. When it is time for the port to come out, a surgery will be done to remove it. The surgery will be similar to the procedure that was done to put the port in. Follow these instructions at home:  Flush your port as told by your health care provider. If you need an infusion over several days, follow instructions from your health care provider about how   to take care of your port site. Make sure you: Wash your hands with soap and water before you change your dressing. If soap and water are not available, use alcohol-based hand sanitizer. Change your dressing as told by your health care provider. Place any used dressings or infusion bags into a plastic bag. Throw that bag in the trash. Keep the dressing that covers the needle clean and dry. Do not get it wet. Do not use scissors or sharp objects near the tube. Keep the tube clamped, unless it is being used. Check your port site every day for signs of infection. Check for: Redness, swelling, or  pain. Fluid or blood. Pus or a bad smell. Protect the skin around the port site. Avoid wearing bra straps that rub or irritate the site. Protect the skin around your port from seat belts. Place a soft pad over your chest if needed. Bathe or shower as told by your health care provider. The site may get wet as long as you are not actively receiving an infusion. Return to your normal activities as told by your health care provider. Ask your health care provider what activities are safe for you. Carry a medical alert card or wear a medical alert bracelet at all times. This will let health care providers know that you have an implanted port in case of an emergency. Get help right away if: You have redness, swelling, or pain at the port site. You have fluid or blood coming from your port site. You have pus or a bad smell coming from the port site. You have a fever. Summary Implanted ports are usually placed in the chest for long-term IV access. Follow instructions from your health care provider about flushing the port and changing bandages (dressings). Take care of the area around your port by avoiding clothing that puts pressure on the area, and by watching for signs of infection. Protect the skin around your port from seat belts. Place a soft pad over your chest if needed. Get help right away if you have a fever or you have redness, swelling, pain, drainage, or a bad smell at the port site. This information is not intended to replace advice given to you by your health care provider. Make sure you discuss any questions you have with your health care provider. Document Revised: 10/19/2020 Document Reviewed: 12/14/2019 Elsevier Patient Education  2022 Elsevier Inc.  

## 2021-06-15 ENCOUNTER — Other Ambulatory Visit: Payer: Self-pay

## 2021-06-15 ENCOUNTER — Inpatient Hospital Stay: Payer: Medicare Other | Admitting: Hematology and Oncology

## 2021-06-15 ENCOUNTER — Encounter: Payer: Self-pay | Admitting: Hematology and Oncology

## 2021-06-15 DIAGNOSIS — M47816 Spondylosis without myelopathy or radiculopathy, lumbar region: Secondary | ICD-10-CM | POA: Diagnosis not present

## 2021-06-15 DIAGNOSIS — D61818 Other pancytopenia: Secondary | ICD-10-CM | POA: Diagnosis not present

## 2021-06-15 DIAGNOSIS — Z888 Allergy status to other drugs, medicaments and biological substances status: Secondary | ICD-10-CM | POA: Diagnosis not present

## 2021-06-15 DIAGNOSIS — M6289 Other specified disorders of muscle: Secondary | ICD-10-CM | POA: Diagnosis not present

## 2021-06-15 DIAGNOSIS — Z7951 Long term (current) use of inhaled steroids: Secondary | ICD-10-CM | POA: Diagnosis not present

## 2021-06-15 DIAGNOSIS — Z885 Allergy status to narcotic agent status: Secondary | ICD-10-CM | POA: Diagnosis not present

## 2021-06-15 DIAGNOSIS — C541 Malignant neoplasm of endometrium: Secondary | ICD-10-CM

## 2021-06-15 DIAGNOSIS — R531 Weakness: Secondary | ICD-10-CM | POA: Diagnosis not present

## 2021-06-15 DIAGNOSIS — I2510 Atherosclerotic heart disease of native coronary artery without angina pectoris: Secondary | ICD-10-CM | POA: Diagnosis not present

## 2021-06-15 DIAGNOSIS — K449 Diaphragmatic hernia without obstruction or gangrene: Secondary | ICD-10-CM | POA: Diagnosis not present

## 2021-06-15 DIAGNOSIS — I700 Atherosclerosis of aorta: Secondary | ICD-10-CM | POA: Diagnosis not present

## 2021-06-15 DIAGNOSIS — K760 Fatty (change of) liver, not elsewhere classified: Secondary | ICD-10-CM | POA: Diagnosis not present

## 2021-06-15 NOTE — Assessment & Plan Note (Signed)
She has profound generalized weakness and deconditioning We discussed the importance of physical therapy and rehab The patient would like to be referred specifically to a group in [place] I recommend that she give me a call when she found out the name of the group so I can make a direct referral We discussed importance of physical therapy and rehab

## 2021-06-15 NOTE — Progress Notes (Signed)
[facility] OFFICE PROGRESS NOTE  Patient Care Team: Janora Norlander, DO as PCP - General (Family Medicine) Lorretta Harp, MD as PCP - Cardiology (Cardiology) Irine Seal, MD (Urology) Paula Compton, MD (Obstetrics and Gynecology) Gatha Mayer, MD (Gastroenterology) Gaynelle Arabian, MD as Consulting Physician (Orthopedic Surgery)  ASSESSMENT & PLAN:  Endometrial cancer [facility]) I have reviewed all imaging studies with the patient and her husband The patient has complete response to chemotherapy The patient is at high risk of cancer recurrence due to the stage of her disease We discussed the importance of dietary modification and weight loss We will update the radiation oncologist about completion of treatment I will defer to radiation oncologist to schedule follow-up From my perspective, I plan to see her within the next 2 months for repeat assessment and port flush  Generalized weakness She has profound generalized weakness and deconditioning We discussed the importance of physical therapy and rehab The patient would like to be referred specifically to a group in [place] I recommend that she give me a call when she found out the name of the group so I can make a direct referral We discussed importance of physical therapy and rehab  Pelvic floor dysfunction She has pelvic floor dysfunction since surgery We discussed importance of physical therapy and rehab  Pancytopenia, acquired [facility]) Her pancytopenia is improving since discontinuation of treatment I recommend recheck blood work in 2 months  No orders of the defined types were placed in this encounter.   All questions were answered. The patient knows to call the clinic with any problems, questions or concerns. The total time spent in the appointment was 30 minutes encounter with patients including review of chart and various tests results, discussions about plan of care and coordination of care plan    Heath Lark, MD 06/15/2021 3:33 PM  INTERVAL HISTORY: Please see below for problem oriented charting. she returns for follow-up since completion of chemotherapy She has significant difficulties with pelvic floor dysfunction, fatigue and generalized weakness She had questions regarding the role of adjuvant radiation treatment She complained of fatigue She is concerned about eating sugar She denies significant neuropathy since completion of treatment  REVIEW OF SYSTEMS:   Constitutional: Denies fevers, chills or abnormal weight loss Eyes: Denies blurriness of vision Ears, nose, mouth, throat, and face: Denies mucositis or sore throat Respiratory: Denies cough, dyspnea or wheezes Cardiovascular: Denies palpitation, chest discomfort or lower extremity swelling Gastrointestinal:  Denies nausea, heartburn or change in bowel habits Skin: Denies abnormal skin rashes Lymphatics: Denies new lymphadenopathy or easy bruising Behavioral/Psych: Mood is stable, no new changes  All other systems were reviewed with the patient and are negative.  I have reviewed the past medical history, past surgical history, social history and family history with the patient and they are unchanged from previous note.  ALLERGIES:  is allergic to tramadol.  MEDICATIONS:  Current Outpatient Medications  Medication Sig Dispense Refill   acetaminophen (TYLENOL) 500 MG tablet Take 1,000 mg by mouth as needed (pain.).     albuterol (VENTOLIN HFA) 108 (90 Base) MCG/ACT inhaler Inhale 2 puffs into the lungs every 6 (six) hours as needed for wheezing or shortness of breath. (Patient taking differently: Inhale 2 puffs into the lungs as needed for wheezing or shortness of breath. PRN) 6.7 g 1   budesonide-formoterol (SYMBICORT) 80-4.5 MCG/ACT inhaler Inhale 2 puffs into the lungs 2 (two) times daily. (Patient taking differently: Inhale 2 puffs into the lungs as needed (  respiratory issues.).) 4 each 11   Calcium Carbonate-Vit  D-Min (CALCIUM 1200 PO) Take 1 tablet by mouth in the morning.     Cholecalciferol (VITAMIN D3) 2000 units TABS Take 2,000 Units by mouth in the morning.     docusate sodium (COLACE) 100 MG capsule Take 100 mg by mouth 2 (two) times daily as needed (constipation.).     DULoxetine (CYMBALTA) 30 MG capsule TAKE 1 CAPSULE BY MOUTH  DAILY 90 capsule 0   esomeprazole (NEXIUM) 40 MG capsule Take 1 capsule (40 mg total) by mouth daily before breakfast. 90 capsule 3   furosemide (LASIX) 20 MG tablet Take 1 tablet (20 mg total) by mouth daily as needed for fluid. (Patient taking differently: Take 20 mg by mouth as needed for fluid. PRN) 90 tablet 1   lactulose (CHRONULAC) 10 GM/15ML solution Take 15 mLs (10 g total) by mouth 3 (three) times daily. 473 mL 0   levothyroxine (SYNTHROID) 50 MCG tablet TAKE 1 TABLET BY MOUTH  DAILY BEFORE BREAKFAST (Patient taking differently: Take 50 mcg by mouth daily before breakfast.) 90 tablet 3   lidocaine-prilocaine (EMLA) cream Apply to affected area once 30 g 3   losartan-hydrochlorothiazide (HYZAAR) 100-12.5 MG tablet Take 1 tablet by mouth daily. (Patient taking differently: Take 1 tablet by mouth in the morning.) 90 tablet 3   Omega-3 Fatty Acids (FISH OIL) 1000 MG CAPS Take 1,000 mg by mouth in the morning.     oxybutynin (DITROPAN) 5 MG tablet Take 2 tablets (10 mg total) by mouth 2 (two) times daily. 360 tablet 3   Plecanatide (TRULANCE) 3 MG TABS Take 1 tablet by mouth daily. 8 tablet 0   valACYclovir (VALTREX) 1000 MG tablet Take 1 tablet twice daily for 7 days.  If you have another flare, take 2 tablets twice a day for ONE day only per flareup. (Patient taking differently: Take 500 mg by mouth 2 (two) times daily as needed (fever blisters). PRN) 30 tablet 0   vitamin B-12 (CYANOCOBALAMIN) 1000 MCG tablet Take 1,000 mcg by mouth in the morning.     No current facility-administered medications for this visit.    SUMMARY OF ONCOLOGIC HISTORY: Oncology History  Overview Note  MSI stable, endometrioid grade 2 Neg genetics   Endometrial cancer ([place])  12/19/2020 Initial Diagnosis   Endometrial cancer ([place])   12/26/2020 Imaging   1. Lower uterine segment/cervical mass without evidence of metastatic disease. 2. Hepatic steatosis. 3. Aortic atherosclerosis (ICD10-I70.0). Coronary artery calcification.   12/29/2020 Pathology Results   FINAL MICROSCOPIC DIAGNOSIS:   A. SENTINEL LYMPH NODE, RIGHT OBTURATOR, EXCISION:  -  No carcinoma identified in one lymph node (0/1)  -  See comment   B. SENTINEL LYMPH NODE, LEFT OBTURATOR, EXCISION:  -  Micrometastasis involving one lymph node (68m/1)  -  See comment   C. UTERUS, CERVIX, BILATERAL TUBES AND OVARIES:  Uterus:  -  Endometrioid carcinoma, FIGO grade 2  -  See oncology table and comment below   Cervix:  -  No carcinoma identified   Bilateral Ovaries:  -  No carcinoma identified   Bilateral Fallopian tubes:  -  No carcinoma identified   ONCOLOGY TABLE:   UTERUS, CARCINOMA OR CARCINOSARCOMA: Resection   Procedure: Total hysterectomy and bilateral salpingo-oophorectomy  Histologic Type: Endometrioid carcinoma  Histologic Grade: FIGO grade 2  Myometrial Invasion:       Percentage of Myometrial Invasion: Estimated to be less than 50%  Uterine Serosa Involvement: Not identified  Cervical stromal Involvement: Not identified  Extent of involvement of other tissue/organs: Not identified  Peritoneal/Ascitic Fluid: Not submitted/unknown  Lymphovascular Invasion: Not identified  Regional Lymph Nodes:       Pelvic Lymph Nodes Examined:  2 Sentinel                                0 non-sentinel                                   2 total       Pelvic Lymph Nodes with Metastasis: 0                           Macrometastasis: (>2.0 mm): 0                          Micrometastasis: (>0.2 mm and < 2.0 mm): 1                            Isolated Tumor Cells (<0.2 mm): 0                       Laterality  of Lymph Node with Tumor: 0                             Extracapsular Extension: N/A  Distant Metastasis:       Distant Site(s) Involved: N/A  Pathologic Stage Classification (pTNM, AJCC 8th Edition): pT1a, pN38m  Ancillary Studies: MMR / MSI testing will be ordered  Representative Tumor Block: C1  Comment(s): Cytokeratin AE1/3 was performed on the lymph nodes (parts a and B) and supports the presence of micrometastasis (part B only). Drs. PSaralyn Pilarand CInd[facility] Ar[facility] Hospitalreviewed select parts of the case and agree with the above diagnosis.   (v4.2.0.1)     12/29/2020 Surgery   Surgeon: RDonaciano EvaPre-operative Diagnosis: endometrial cancer grade 3    Operation: Robotic-assisted laparoscopic total hysterectomy with bilateral salpingoophorectomy, SLN biopsy   Surgeon: RDonaciano Eva Specimens: uterus, cervix, bilateral tubes and ovaries, right and left obturator SLN           01/22/2021 Cancer Staging   Staging form: Corpus Uteri - Carcinoma and Carcinosarcoma, AJCC 8th Edition - Pathologic stage from 01/22/2021: Stage IIIC1 (pT1a, pN141m cM0) - Signed by GoHeath LarkMD on 01/22/2021 Stage prefix: Initial diagnosis    01/25/2021 Procedure   Successful placement of a right IJ approach Power Port with ultrasound and fluoroscopic guidance. The catheter is ready for use.   02/07/2021 - 05/19/2021 Chemotherapy   Patient is on Treatment Plan : UTERINE Carboplatin AUC 6 / Paclitaxel q21d     02/11/2021 Genetic Testing   Negative hereditary cancer genetic testing: no pathogenic variants detected in [facility] Common Hereditary Cancers +RNA Panel.  The report date is February 11, 2021.    The Common Hereditary Cancers + RNA Panel offered by [facility] includes sequencing, deletion/duplication, and RNA testing of the following 47 genes: APC, ATM, AXIN2, BARD1, BMPR1A, BRCA1, BRCA2, BRIP1, CDH1, CDK4*, CDKN2A (p14ARF)*, CDKN2A (p16INK4a)*, CHEK2, CTNNA1, DICER1, EPCAM (Deletion/duplication testing only),  GREM1 (promoter region deletion/duplication testing only), KIT, MEN1, MLH1, MSH2, MSH3, MSH6, MUTYH, NBN, NF1, NHTL1,  PALB2, PDGFRA*, PMS2, POLD1, POLE, PTEN, RAD50, RAD51C, RAD51D, SDHB, SDHC, SDHD, SMAD4, SMARCA4. STK11, TP53, TSC1, TSC2, and VHL.  The following genes were evaluated for sequence changes only: SDHA and HOXB13 c.251G>A variant only.  RNA analysis is not performed for the * genes.     06/14/2021 Imaging   1. Interval hysterectomy and bilateral salpingo oophorectomy. No findings of residual/recurrent malignancy. 2. Substantial lumbar spondylosis and degenerative disc disease. In addition, a right eccentric intervertebral spur at T8-9 causes prominent central narrowing of the thecal sac. 3. Other imaging findings of potential clinical significance: Aortic Atherosclerosis (ICD10-I70.0). Systemic atherosclerosis. Small type 1 hiatal hernia.       PHYSICAL EXAMINATION: ECOG PERFORMANCE STATUS: 1 - Symptomatic but completely ambulatory  Vitals:   06/15/21 1317  BP: (!) 157/64  Pulse: 83  Resp: 18  SpO2: 100%   Filed Weights   06/15/21 1317  Weight: 219 lb 9.6 oz (99.6 kg)    GENERAL:alert, no distress and comfortable Musculoskeletal:no cyanosis of digits and no clubbing  NEURO: alert & oriented x 3 with fluent speech, no focal motor/sensory deficits  LABORATORY DATA:  I have reviewed the data as listed    Component Value Date/Time   NA 140 06/13/2021 0938   NA 141 08/31/2020 1149   K 3.8 06/13/2021 0938   CL 104 06/13/2021 0938   CO2 28 06/13/2021 0938   GLUCOSE 93 06/13/2021 0938   BUN 10 06/13/2021 0938   BUN 15 08/31/2020 1149   CREATININE 0.67 06/13/2021 0938   CREATININE 0.60 11/03/2012 1228   CALCIUM 9.2 06/13/2021 0938   PROT 6.6 06/13/2021 0938   PROT 7.3 08/31/2020 1149   ALBUMIN 3.6 06/13/2021 0938   ALBUMIN 4.7 08/31/2020 1149   AST 14 (L) 06/13/2021 0938   ALT 15 06/13/2021 0938   ALKPHOS 100 06/13/2021 0938   BILITOT 0.3 06/13/2021 0938    GFRNONAA >60 06/13/2021 0938   GFRAA 91 08/31/2020 1149    No results found for: SPEP, UPEP  Lab Results  Component Value Date   WBC 4.1 06/13/2021   NEUTROABS 2.3 06/13/2021   HGB 10.8 (L) 06/13/2021   HCT 32.4 (L) 06/13/2021   MCV 98.8 06/13/2021   PLT 153 06/13/2021      Chemistry      Component Value Date/Time   NA 140 06/13/2021 0938   NA 141 08/31/2020 1149   K 3.8 06/13/2021 0938   CL 104 06/13/2021 0938   CO2 28 06/13/2021 0938   BUN 10 06/13/2021 0938   BUN 15 08/31/2020 1149   CREATININE 0.67 06/13/2021 0938   CREATININE 0.60 11/03/2012 1228   GLU 81 07/24/2012 0000      Component Value Date/Time   CALCIUM 9.2 06/13/2021 0938   ALKPHOS 100 06/13/2021 0938   AST 14 (L) 06/13/2021 0938   ALT 15 06/13/2021 0938   BILITOT 0.3 06/13/2021 0938       RADIOGRAPHIC STUDIES: I have reviewed multiple imaging studies with the patient and husband I have personally reviewed the radiological images as listed and agreed with the findings in the report. CT ABDOMEN PELVIS W CONTRAST  Result Date: 06/14/2021 CLINICAL DATA:  Assess treatment response, uterine cervical cancer. Prior hysterectomy and chemotherapy. EXAM: CT ABDOMEN AND PELVIS WITH CONTRAST TECHNIQUE: Multidetector CT imaging of the abdomen and pelvis was performed using the standard protocol following bolus administration of intravenous contrast. CONTRAST:  68m OMNIPAQUE IOHEXOL 350 MG/ML SOLN COMPARISON:  12/26/2020 FINDINGS: Lower chest: The tip of a  presumed central venous catheter terminates at the right atrium. Small type 1 hiatal hernia. Descending thoracic aortic atherosclerotic calcification. Upper normal heart size. Hepatobiliary: Unremarkable.  No biliary dilatation. Pancreas: Unremarkable Spleen: Unremarkable Adrenals/Urinary Tract: 4 mm hypodense lesion posteriorly in the right mid kidney on image 14 series 7 is likely a cyst but technically too small to characterize. Adrenal glands unremarkable. Urinary  bladder unremarkable. Stomach/Bowel: Small hiatal hernia.  The bowel appears unremarkable. Vascular/Lymphatic: Atherosclerosis is present, including aortoiliac atherosclerotic disease. Reproductive: The uterus is currently surgically absent as are the ovaries. No mass lesion along the vaginal cuff. Other: No supplemental non-categorized findings. Musculoskeletal: Lumbar spondylosis and degenerative disc disease causing multilevel impingement. In addition there is substantial right eccentric intervertebral spurring at the T8-9 level obliquely narrowing the thecal sac to 0.5 cm on image 7 series 2 compatible with prominent central narrowing of the thecal sac. IMPRESSION: 1. Interval hysterectomy and bilateral salpingo oophorectomy. No findings of residual/recurrent malignancy. 2. Substantial lumbar spondylosis and degenerative disc disease. In addition, a right eccentric intervertebral spur at T8-9 causes prominent central narrowing of the thecal sac. 3. Other imaging findings of potential clinical significance: Aortic Atherosclerosis (ICD10-I70.0). Systemic atherosclerosis. Small type 1 hiatal hernia. Electronically Signed   By: Van Clines M.D.   On: 06/14/2021 08:55

## 2021-06-15 NOTE — Assessment & Plan Note (Signed)
I have reviewed all imaging studies with the patient and her husband The patient has complete response to chemotherapy The patient is at high risk of cancer recurrence due to the stage of her disease We discussed the importance of dietary modification and weight loss We will update the radiation oncologist about completion of treatment I will defer to radiation oncologist to schedule follow-up From my perspective, I plan to see her within the next 2 months for repeat assessment and port flush

## 2021-06-15 NOTE — Assessment & Plan Note (Signed)
She has pelvic floor dysfunction since surgery We discussed importance of physical therapy and rehab

## 2021-06-15 NOTE — Assessment & Plan Note (Signed)
Her pancytopenia is improving since discontinuation of treatment I recommend recheck blood work in 2 months

## 2021-06-19 ENCOUNTER — Telehealth: Payer: Self-pay | Admitting: Oncology

## 2021-06-19 NOTE — Telephone Encounter (Signed)
Pam is still researching physical therapy facilities.  She will call us today and let us know which one to refer her to.

## 2021-06-20 ENCOUNTER — Other Ambulatory Visit: Payer: Self-pay | Admitting: Family Medicine

## 2021-06-23 ENCOUNTER — Encounter: Payer: Medicare Other | Admitting: Internal Medicine

## 2021-06-26 ENCOUNTER — Encounter: Payer: Self-pay | Admitting: Hematology and Oncology

## 2021-06-26 ENCOUNTER — Telehealth: Payer: Self-pay | Admitting: Internal Medicine

## 2021-06-26 NOTE — Telephone Encounter (Signed)
Pam left a message that she would like a referral sent to [facility], PT at [facility] in [place], [place]. The fax number is [phone].

## 2021-06-26 NOTE — Telephone Encounter (Signed)
Returned pt's phone call. Left voicemail informing her that we do not access portacaths at our facility. Instructed her to continue on her normal schedule for having care of the portacath. Informed her that we would be starting a peripheral IV for her procedure. Instructed to call back if any further questions.

## 2021-06-26 NOTE — Telephone Encounter (Signed)
Patient has endo/colon tomorrow.  She has cancer and has a port.  Does she need to go by the cancer center today to have the port cleared out, or will it be done here when she comes tomorrow.  Please call and advise.  OK to leave a message.

## 2021-06-26 NOTE — Telephone Encounter (Signed)
Referral faxed successfully to [phone].

## 2021-06-27 ENCOUNTER — Ambulatory Visit (AMBULATORY_SURGERY_CENTER): Payer: Medicare Other | Admitting: Internal Medicine

## 2021-06-27 ENCOUNTER — Other Ambulatory Visit: Payer: Self-pay

## 2021-06-27 ENCOUNTER — Encounter: Payer: Self-pay | Admitting: Internal Medicine

## 2021-06-27 VITALS — BP 173/78 | HR 80 | Temp 97.1°F | Resp 13 | Ht 60.75 in | Wt 237.0 lb

## 2021-06-27 DIAGNOSIS — R1319 Other dysphagia: Secondary | ICD-10-CM

## 2021-06-27 DIAGNOSIS — Z800 Family history of malignant neoplasm of digestive organs: Secondary | ICD-10-CM | POA: Diagnosis not present

## 2021-06-27 DIAGNOSIS — Z1211 Encounter for screening for malignant neoplasm of colon: Secondary | ICD-10-CM

## 2021-06-27 DIAGNOSIS — K317 Polyp of stomach and duodenum: Secondary | ICD-10-CM | POA: Diagnosis not present

## 2021-06-27 MED ORDER — SODIUM CHLORIDE 0.9 % IV SOLN: 500.0000 mL | Freq: Once | INTRAVENOUS | Status: DC

## 2021-06-27 NOTE — Op Note (Signed)
[facility] Patient Name: Stacie Russell Procedure Date: 06/27/2021 10:25 AM MRN: [id number] Endoscopist: Gatha Mayer , MD Age: 70 Referring MD:  Date of Birth: 1950-11-14 Gender: Female Account #: [id number] Procedure:                Upper GI endoscopy Indications:              Dysphagia Medicines:                Propofol per Anesthesia, Monitored Anesthesia Care Procedure:                Pre-Anesthesia Assessment:                           - Prior to the procedure, a History and Physical                            was performed, and patient medications and                            allergies were reviewed. The patient's tolerance of                            previous anesthesia was also reviewed. The risks                            and benefits of the procedure and the sedation                            options and risks were discussed with the patient.                            All questions were answered, and informed consent                            was obtained. Prior Anticoagulants: The patient has                            taken no previous anticoagulant or antiplatelet                            agents. ASA Grade Assessment: III - A patient with                            severe systemic disease. After reviewing the risks                            and benefits, the patient was deemed in                            satisfactory condition to undergo the procedure.                           After obtaining informed consent, the endoscope was  passed under direct vision. Throughout the                            procedure, the patient's blood pressure, pulse, and                            oxygen saturations were monitored continuously. The                            GIF [id number] #[id number] was introduced through the                            mouth, and advanced to the second part of duodenum.                            The upper GI  endoscopy was accomplished without                            difficulty. The patient tolerated the procedure                            well. Scope In: Scope Out: Findings:                 The examined esophagus was mildly tortuous. The                            scope was withdrawn. Dilation was performed with a                            Maloney dilator with mild resistance at 69 Fr. The                            dilation site was examined following endoscope                            reinsertion and showed no change. Estimated blood                            loss: none.                           Multiple small semi-sessile polyps were found in                            the gastric fundus and in the gastric body.                           A small amount of food (residue) was found in the                            gastric antrum.                           Food (residue) was found in the duodenal  bulb and                            in the second portion of the duodenum.                           The cardia and gastric fundus were normal on                            retroflexion.                           The exam was otherwise without abnormality. Complications:            No immediate complications. Impression:               - Tortuous esophagus. As before. I did not see                            cervical web that was seen on Ba swallow                           - Multiple gastric polyps. Known fundic gland polyps                           - A small amount of food (residue) in the                            stomach.Very scant mucoid material                           - Retained food in the duodenum. Scant mucoid                            material                           - The examination was otherwise normal.                           - No specimens collected. Recommendation:           - Patient has a contact number available for                            emergencies. The  signs and symptoms of potential                            delayed complications were discussed with the                            patient. Return to normal activities tomorrow.                            Written discharge instructions were provided to the  patient.                           - Clear liquids x 1 hour then soft foods rest of                            day. Start prior diet tomorrow.                           - Continue present medications.                           - See the other procedure note for documentation of                            additional recommendations. Gatha Mayer, MD 06/27/2021 11:16:54 AM This report has been signed electronically.

## 2021-06-27 NOTE — Patient Instructions (Addendum)
I dilated the esophagus again. Hope that solves the swallowing problems again. I saw your known benign stomach polyps again, also.  Colonoscopy without polyps. Will consider another in 5 years.  I appreciate the opportunity to care for you. Gatha Mayer, MD, [facility]   Discharge instructions given. Handouts on dilatation diet and diverticulosis. Resume previous medications. YOU HAD AN ENDOSCOPIC PROCEDURE TODAY AT [facility]:   Refer to the procedure report that was given to you for any specific questions about what was found during the examination.  If the procedure report does not answer your questions, please call your gastroenterologist to clarify.  If you requested that your care partner not be given the details of your procedure findings, then the procedure report has been included in a sealed envelope for you to review at your convenience later.  YOU SHOULD EXPECT: Some feelings of bloating in the abdomen. Passage of more gas than usual.  Walking can help get rid of the air that was put into your GI tract during the procedure and reduce the bloating. If you had a lower endoscopy (such as a colonoscopy or flexible sigmoidoscopy) you may notice spotting of blood in your stool or on the toilet paper. If you underwent a bowel prep for your procedure, you may not have a normal bowel movement for a few days.  Please Note:  You might notice some irritation and congestion in your nose or some drainage.  This is from the oxygen used during your procedure.  There is no need for concern and it should clear up in a day or so.  SYMPTOMS TO REPORT IMMEDIATELY:  Following lower endoscopy (colonoscopy or flexible sigmoidoscopy):  Excessive amounts of blood in the stool  Significant tenderness or worsening of abdominal pains  Swelling of the abdomen that is new, acute  Fever of 100F or higher  Following upper endoscopy (EGD)  Vomiting of blood or coffee ground material  New chest  pain or pain under the shoulder blades  Painful or persistently difficult swallowing  New shortness of breath  Fever of 100F or higher  Black, tarry-looking stools  For urgent or emergent issues, a gastroenterologist can be reached at any hour by calling [phone]. Do not use MyChart messaging for urgent concerns.    DIET:  We do recommend a small meal at first, but then you may proceed to your regular diet.  Drink plenty of fluids but you should avoid alcoholic beverages for 24 hours.  ACTIVITY:  You should plan to take it easy for the rest of today and you should NOT DRIVE or use heavy machinery until tomorrow (because of the sedation medicines used during the test).    FOLLOW UP: Our staff will call the number listed on your records 48-72 hours following your procedure to check on you and address any questions or concerns that you may have regarding the information given to you following your procedure. If we do not reach you, we will leave a message.  We will attempt to reach you two times.  During this call, we will ask if you have developed any symptoms of COVID 19. If you develop any symptoms (ie: fever, flu-like symptoms, shortness of breath, cough etc.) before then, please call [phone].  If you test positive for Covid 19 in the 2 weeks post procedure, please call and report this information to [place].    If any biopsies were taken you will be contacted by phone or by letter  within the next 1-3 weeks.  Please call us at [phone] if you have not heard about the biopsies in 3 weeks.    SIGNATURES/CONFIDENTIALITY: You and/or your care partner have signed paperwork which will be entered into your electronic medical record.  These signatures attest to the fact that that the information above on your After Visit Summary has been reviewed and is understood.  Full responsibility of the confidentiality of this discharge information lies with you and/or your care-partner.

## 2021-06-27 NOTE — Progress Notes (Signed)
[facility] History and Physical   Primary Care Physician:  Janora Norlander, DO   Reason for Procedure:   Dysphagia and famiy hx CRCA  Plan:    EGD, colonoscopy     HPI: Stacie Russell is a 70 y.o. female here for EGD and colonoscopy - was seen in June w/ assessment and plan as below.  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   Remainder of exam unremarkable   CT abd/pelvis 06/14/21 IMPRESSION: 1. Interval hysterectomy and bilateral salpingo oophorectomy. No findings of residual/recurrent malignancy. 2. Substantial lumbar spondylosis and degenerative disc disease. In addition, a right eccentric intervertebral spur at T8-9 causes prominent central narrowing of the thecal sac. 3. Other imaging findings of potential clinical significance: Aortic Atherosclerosis (ICD10-I70.0). Systemic atherosclerosis. Small type 1 hiatal hernia.  Assessment & Plan:        Encounter Diagnoses  Name Primary?   Esophageal dysphagia Yes   Family history of colon cancer in mother - dx 50's     Hiccups     Bleeding external hemorrhoids     Endometrial cancer ([place])        She may have motility problem.  I am going to plan for an EGD and colonoscopy for the dysphagia and family history of colon cancer.  I think she had some bleeding from hemorrhoids recently and that should calm down as she really regulates her defecation pattern after her transient constipation issues.     Change PPI as below move from pantoprazole to Nexium generic.  She needs to be cautious and chew well in the interim regarding dysphagia.   The risks and benefits as well as alternatives of endoscopic procedure(s) have been discussed and reviewed. All questions answered. The patient agrees to proceed.   Reemphasized GERD diet.  She does not seem to drink a lot of caffeine or sodas.  Handout  provided.  Obesity and particularly abdominal obesity reduction would help.  I did not broach that today but will try to at a later date.  She is dealing with a lot right now particularly the endometrial cancer which will require chemotherapy.      Past Medical History:  Diagnosis Date   Arthritis    Asthma    Diverticulosis    Dyspnea    with exertion    Endometrial hyperplasia    Esophageal stricture    Family history of breast cancer 01/26/2021   Foot pain    Fundic gland polyps of stomach, benign 07/10/2015   Gastritis    GERD (gastroesophageal reflux disease)    Hemorrhoid    Hiatal hernia    Hip pain    History of kidney stones    cystoscopy   History of surgery on arm    left humerus   Hyperlipidemia    Hypertension    Hypothyroidism    Knee pain    Metabolic syndrome    Obstructive sleep apnea 08/27/2017   pt denies    Oxygen deficiency    pt denies   PONV (postoperative nausea and vomiting)    Spinal headache    few headaches recently    Tendonitis of ankle    Vitamin D deficiency     Past Surgical History:  Procedure Laterality Date   ANAL FISSURE REPAIR     CATARACT EXTRACTION  May 2009   COLONOSCOPY  12/26/07   ESOPHAGEAL DILATION  ESOPHAGOGASTRODUODENOSCOPY  12/26/07, 05/13/08   EYE SURGERY Bilateral    bilateral cataract with lens implants   IR IMAGING GUIDED PORT INSERTION  01/25/2021   KNEE CARTILAGE SURGERY Right Sept 1999   ORIF HUMERUS FRACTURE Left 01/12/2020   Procedure: OPEN REDUCTION INTERNAL FIXATION (ORIF) LEFT HUMERUS FRACTURE;  Surgeon: Roseanne Kaufman, MD;  Location: [facility];  Service: Orthopedics;  Laterality: Left;   ROBOTIC ASSISTED TOTAL HYSTERECTOMY WITH BILATERAL SALPINGO OOPHERECTOMY Bilateral 12/29/2020   Procedure: XI ROBOTIC ASSISTED TOTAL HYSTERECTOMY WITH BILATERAL SALPINGO OOPHORECTOMY;  Surgeon: Everitt Amber, MD;  Location: [facility];  Service: Gynecology;  Laterality: Bilateral;   SENTINEL NODE BIOPSY N/A 12/29/2020   Procedure:  SENTINEL NODE BIOPSY;  Surgeon: Everitt Amber, MD;  Location: [facility];  Service: Gynecology;  Laterality: N/A;   TOTAL KNEE ARTHROPLASTY Right 10/07/2017   Procedure: RIGHT TOTAL KNEE ARTHROPLASTY;  Surgeon: Gaynelle Arabian, MD;  Location: [facility];  Service: Orthopedics;  Laterality: Right;  Adductor Block   UMBILICAL HERNIA REPAIR  05/20/06    Prior to Admission medications   Medication Sig Start Date End Date Taking? Authorizing Provider  acetaminophen (TYLENOL) 500 MG tablet Take 1,000 mg by mouth as needed (pain.).   Yes [provider]  Calcium Carbonate-Vit D-Min (CALCIUM 1200 PO) Take 1 tablet by mouth in the morning.   Yes [provider]  Cholecalciferol (VITAMIN D3) 2000 units TABS Take 2,000 Units by mouth in the morning.   Yes [provider]  DULoxetine (CYMBALTA) 30 MG capsule TAKE 1 CAPSULE BY MOUTH  DAILY 06/14/21  Yes Ronnie Doss M, DO  esomeprazole (NEXIUM) 40 MG capsule Take 1 capsule (40 mg total) by mouth daily before breakfast. 01/13/21  Yes Gatha Mayer, MD  levothyroxine (SYNTHROID) 50 MCG tablet TAKE 1 TABLET BY MOUTH  DAILY BEFORE BREAKFAST Patient taking differently: Take 50 mcg by mouth daily before breakfast. 03/17/20  Yes Gottschalk, Ashly M, DO  losartan-hydrochlorothiazide (HYZAAR) 100-12.5 MG tablet TAKE 1 TABLET BY MOUTH  DAILY 06/21/21  Yes Gottschalk, Leatrice Jewels M, DO  Omega-3 Fatty Acids (FISH OIL) 1000 MG CAPS Take 1,000 mg by mouth in the morning.   Yes [provider]  oxybutynin (DITROPAN) 5 MG tablet Take 2 tablets (10 mg total) by mouth 2 (two) times daily. 08/31/20  Yes Gottschalk, Leatrice Jewels M, DO  vitamin B-12 (CYANOCOBALAMIN) 1000 MCG tablet Take 1,000 mcg by mouth in the morning.   Yes [provider]  albuterol (VENTOLIN HFA) 108 (90 Base) MCG/ACT inhaler Inhale 2 puffs into the lungs every 6 (six) hours as needed for wheezing or shortness of breath. Patient taking differently: Inhale 2 puffs into the lungs as needed  for wheezing or shortness of breath. PRN 09/06/20   Ronnie Doss M, DO  budesonide-formoterol (SYMBICORT) 80-4.5 MCG/ACT inhaler Inhale 2 puffs into the lungs 2 (two) times daily. Patient taking differently: Inhale 2 puffs into the lungs as needed (respiratory issues.). 09/01/20   Janora Norlander, DO  docusate sodium (COLACE) 100 MG capsule Take 100 mg by mouth 2 (two) times daily as needed (constipation.).    [provider]  furosemide (LASIX) 20 MG tablet Take 1 tablet (20 mg total) by mouth daily as needed for fluid. Patient taking differently: Take 20 mg by mouth as needed for fluid. PRN 08/31/20   Janora Norlander, DO  lactulose (CHRONULAC) 10 GM/15ML solution Take 15 mLs (10 g total) by mouth 3 (three) times daily. 02/14/21   Heath Lark, MD  lidocaine-prilocaine (EMLA)  cream Apply to affected area once 01/23/21   Heath Lark, MD  Plecanatide (TRULANCE) 3 MG TABS Take 1 tablet by mouth daily. 03/22/21   Janora Norlander, DO  valACYclovir (VALTREX) 1000 MG tablet Take 1 tablet twice daily for 7 days.  If you have another flare, take 2 tablets twice a day for ONE day only per flareup. Patient taking differently: Take 500 mg by mouth 2 (two) times daily as needed (fever blisters). PRN 09/20/20   Janora Norlander, DO    Current Outpatient Medications  Medication Sig Dispense Refill   acetaminophen (TYLENOL) 500 MG tablet Take 1,000 mg by mouth as needed (pain.).     Calcium Carbonate-Vit D-Min (CALCIUM 1200 PO) Take 1 tablet by mouth in the morning.     Cholecalciferol (VITAMIN D3) 2000 units TABS Take 2,000 Units by mouth in the morning.     DULoxetine (CYMBALTA) 30 MG capsule TAKE 1 CAPSULE BY MOUTH  DAILY 90 capsule 0   esomeprazole (NEXIUM) 40 MG capsule Take 1 capsule (40 mg total) by mouth daily before breakfast. 90 capsule 3   levothyroxine (SYNTHROID) 50 MCG tablet TAKE 1 TABLET BY MOUTH  DAILY BEFORE BREAKFAST (Patient taking differently: Take 50 mcg by mouth daily  before breakfast.) 90 tablet 3   losartan-hydrochlorothiazide (HYZAAR) 100-12.5 MG tablet TAKE 1 TABLET BY MOUTH  DAILY 90 tablet 0   Omega-3 Fatty Acids (FISH OIL) 1000 MG CAPS Take 1,000 mg by mouth in the morning.     oxybutynin (DITROPAN) 5 MG tablet Take 2 tablets (10 mg total) by mouth 2 (two) times daily. 360 tablet 3   vitamin B-12 (CYANOCOBALAMIN) 1000 MCG tablet Take 1,000 mcg by mouth in the morning.     albuterol (VENTOLIN HFA) 108 (90 Base) MCG/ACT inhaler Inhale 2 puffs into the lungs every 6 (six) hours as needed for wheezing or shortness of breath. (Patient taking differently: Inhale 2 puffs into the lungs as needed for wheezing or shortness of breath. PRN) 6.7 g 1   budesonide-formoterol (SYMBICORT) 80-4.5 MCG/ACT inhaler Inhale 2 puffs into the lungs 2 (two) times daily. (Patient taking differently: Inhale 2 puffs into the lungs as needed (respiratory issues.).) 4 each 11   docusate sodium (COLACE) 100 MG capsule Take 100 mg by mouth 2 (two) times daily as needed (constipation.).     furosemide (LASIX) 20 MG tablet Take 1 tablet (20 mg total) by mouth daily as needed for fluid. (Patient taking differently: Take 20 mg by mouth as needed for fluid. PRN) 90 tablet 1   lactulose (CHRONULAC) 10 GM/15ML solution Take 15 mLs (10 g total) by mouth 3 (three) times daily. 473 mL 0   lidocaine-prilocaine (EMLA) cream Apply to affected area once 30 g 3   Plecanatide (TRULANCE) 3 MG TABS Take 1 tablet by mouth daily. 8 tablet 0   valACYclovir (VALTREX) 1000 MG tablet Take 1 tablet twice daily for 7 days.  If you have another flare, take 2 tablets twice a day for ONE day only per flareup. (Patient taking differently: Take 500 mg by mouth 2 (two) times daily as needed (fever blisters). PRN) 30 tablet 0   No current facility-administered medications for this visit.    Allergies as of 06/27/2021 - Review Complete 06/27/2021  Allergen Reaction Noted   Tramadol Other (See Comments) 12/19/2020     Family History  Problem Relation Age of Onset   Colon cancer Mother 66   Heart attack Father    Heart disease  Sister    Breast cancer Sister 70   Breast cancer Sister        dx 17s   Cancer Maternal Aunt 55       "female cancer", unknown type   Cancer Maternal Grandmother        unknown type; dx 37s   Cancer Maternal Grandfather        unknown type; dx 53s   Cancer Cousin        maternal female cousin; dx 67s; unknown type   Cancer Cousin        maternal female cousin; dx 49s; unknown type   Cancer Cousin        maternal female cousin; dx after 43; unknown type   Esophageal cancer Neg Hx    Stomach cancer Neg Hx    Rectal cancer Neg Hx     Social History   Socioeconomic History   Marital status: Married    Spouse name: Marcello Moores   Number of children: 2   Years of education: 14   Highest education level: Associate degree: academic program  Occupational History   Occupation: Retired  Tobacco Use   Smoking status: Never   Smokeless tobacco: Never  Scientific laboratory technician Use: Never used  Substance and Sexual Activity   Alcohol use: No   Drug use: No   Sexual activity: Not Currently    Birth control/protection: Post-menopausal  Other Topics Concern   Not on file  Social History Narrative   Married and retired   No EtOH, tobacco or drug use   Social Determinants of Radio broadcast assistant Strain: Low Risk    Difficulty of Paying Living Expenses: Not hard at all  Food Insecurity: No Food Insecurity   Worried About Charity fundraiser in the Last Year: Never true   Arboriculturist in the Last Year: Never true  Transportation Needs: No Transportation Needs   Lack of Transportation (Medical): No   Lack of Transportation (Non-Medical): No  Physical Activity: Inactive   Days of Exercise per Week: 0 days   Minutes of Exercise per Session: 0 min  Stress: Stress Concern Present   Feeling of Stress : To some extent  Social Connections: Psychologist, educational of Communication with Friends and Family: More than three times a week   Frequency of Social Gatherings with Friends and Family: More than three times a week   Attends Religious Services: More than 4 times per year   Active Member of [employer] or Organizations: Yes   Attends Archivist Meetings: 1 to 4 times per year   Marital Status: Married  Human resources officer Violence: Not At Risk   Fear of Current or Ex-Partner: No   Emotionally Abused: No   Physically Abused: No   Sexually Abused: No    Review of Systems:  All other review of systems negative except as mentioned in the HPI.  Physical Exam: Vital signs BP (!) 151/76 (BP Location: Right Arm, Patient Position: Sitting, Cuff Size: Normal)   Pulse 81   Temp (!) 97.1 F (36.2 C) (Temporal)   Ht 5' 0.75" (1.543 m)   Wt 237 lb (107.5 kg)   SpO2 100%   BMI 45.15 kg/m   General:   Alert,  Well-developed, well-nourished, pleasant and cooperative in NAD Lungs:  Clear throughout to auscultation.   Heart:  Regular rate and rhythm; no murmurs, clicks, rubs,  or gallops. Abdomen:  Soft, nontender and  nondistended. Normal bowel sounds.   Neuro/Psych:  Alert and cooperative. Normal mood and affect. A and O x 3   @Denzil Mceachron  Simonne Maffucci, MD, [facility] [phone] (pager) 06/27/2021 10:25 AM@

## 2021-06-27 NOTE — Progress Notes (Signed)
A and O x3. Report to RN. Tolerated MAC anesthesia well.Teeth unchanged after procedure. 

## 2021-06-27 NOTE — Progress Notes (Signed)
Vitals-CW  History reviewed.  Chemotherapy last was 06/19/2021

## 2021-06-27 NOTE — Progress Notes (Signed)
Called to room to assist during endoscopic procedure.  Patient ID and intended procedure confirmed with present staff. Received instructions for my participation in the procedure from the performing physician.  

## 2021-06-27 NOTE — Op Note (Signed)
[facility] Patient Name: Stacie Russell Procedure Date: 06/27/2021 10:24 AM MRN: [id number] Endoscopist: Gatha Mayer , MD Age: 71 Referring MD:  Date of Birth: 1951-05-21 Gender: Female Account #: [id number] Procedure:                Colonoscopy Indications:              Screening in patient at increased risk: Colorectal                            cancer in mother before age 110 Medicines:                Propofol per Anesthesia, Monitored Anesthesia Care Procedure:                Pre-Anesthesia Assessment:                           - Prior to the procedure, a History and Physical                            was performed, and patient medications and                            allergies were reviewed. The patient's tolerance of                            previous anesthesia was also reviewed. The risks                            and benefits of the procedure and the sedation                            options and risks were discussed with the patient.                            All questions were answered, and informed consent                            was obtained. Prior Anticoagulants: The patient has                            taken no previous anticoagulant or antiplatelet                            agents. ASA Grade Assessment: III - A patient with                            severe systemic disease. After reviewing the risks                            and benefits, the patient was deemed in                            satisfactory condition to undergo the procedure.  After obtaining informed consent, the colonoscope                            was passed under direct vision. Throughout the                            procedure, the patient's blood pressure, pulse, and                            oxygen saturations were monitored continuously. The                            CF [id number] #[id number] was introduced through the anus                             and advanced to the the cecum, identified by                            appendiceal orifice and ileocecal valve. The                            colonoscopy was performed without difficulty. The                            patient tolerated the procedure well. The quality                            of the bowel preparation was adequate. The                            ileocecal valve, appendiceal orifice, and rectum                            were photographed. Scope In: 10:45:58 AM Scope Out: 11:03:06 AM Scope Withdrawal Time: 0 hours 13 minutes 6 seconds  Total Procedure Duration: 0 hours 17 minutes 8 seconds  Findings:                 The perianal and digital rectal examinations were                            normal.                           Multiple diverticula were found in the sigmoid                            colon.                           The exam was otherwise without abnormality on                            direct and retroflexion views. Complications:            No immediate complications. Estimated Blood Loss:  Estimated blood loss: none. Impression:               - Diverticulosis in the sigmoid colon.                           - The examination was otherwise normal on direct                            and retroflexion views.                           - No specimens collected. Recommendation:           - Patient has a contact number available for                            emergencies. The signs and symptoms of potential                            delayed complications were discussed with the                            patient. Return to normal activities tomorrow.                            Written discharge instructions were provided to the                            patient.                           - Resume previous diet.                           - Continue present medications.                           - Repeat colonoscopy in 5 years for screening                             purposes. If remains fot at [place], MD 06/27/2021 11:18:41 AM This report has been signed electronically.

## 2021-06-29 ENCOUNTER — Telehealth: Payer: Self-pay | Admitting: *Deleted

## 2021-06-29 NOTE — Telephone Encounter (Signed)
  Follow up Call-  Call back number 06/27/2021  Post procedure Call Back phone  # [phone]  Permission to leave phone message Yes  Some recent data might be hidden     Patient questions:  Do you have a fever, pain , or abdominal swelling? No. Pain Score  0 *  Have you tolerated food without any problems? Yes.    Have you been able to return to your normal activities? Yes.    Do you have any questions about your discharge instructions: Diet   No. Medications  No. Follow up visit  No.  Do you have questions or concerns about your Care? No.  Actions: * If pain score is 4 or above: No action needed, pain <4.  Have you developed a fever since your procedure? no  2.   Have you had an respiratory symptoms (SOB or cough) since your procedure? no  3.   Have you tested positive for COVID 19 since your procedure no  4.   Have you had any family members/close contacts diagnosed with the COVID 19 since your procedure?  no   If yes to any of these questions please route to Joylene John, RN and Joella Prince, RN

## 2021-07-10 DIAGNOSIS — Z1231 Encounter for screening mammogram for malignant neoplasm of breast: Secondary | ICD-10-CM | POA: Diagnosis not present

## 2021-07-19 ENCOUNTER — Other Ambulatory Visit: Payer: Self-pay

## 2021-07-19 ENCOUNTER — Encounter: Payer: Self-pay | Admitting: Family Medicine

## 2021-07-19 ENCOUNTER — Other Ambulatory Visit: Payer: Self-pay | Admitting: Family Medicine

## 2021-07-19 ENCOUNTER — Ambulatory Visit (INDEPENDENT_AMBULATORY_CARE_PROVIDER_SITE_OTHER): Payer: Medicare Other | Admitting: Family Medicine

## 2021-07-19 VITALS — BP 148/91 | HR 67 | Temp 98.1°F | Ht 61.0 in | Wt 215.0 lb

## 2021-07-19 DIAGNOSIS — M5136 Other intervertebral disc degeneration, lumbar region: Secondary | ICD-10-CM | POA: Diagnosis not present

## 2021-07-19 DIAGNOSIS — C541 Malignant neoplasm of endometrium: Secondary | ICD-10-CM | POA: Diagnosis not present

## 2021-07-19 DIAGNOSIS — E782 Mixed hyperlipidemia: Secondary | ICD-10-CM

## 2021-07-19 DIAGNOSIS — I700 Atherosclerosis of aorta: Secondary | ICD-10-CM | POA: Diagnosis not present

## 2021-07-19 DIAGNOSIS — K449 Diaphragmatic hernia without obstruction or gangrene: Secondary | ICD-10-CM | POA: Diagnosis not present

## 2021-07-19 DIAGNOSIS — E039 Hypothyroidism, unspecified: Secondary | ICD-10-CM

## 2021-07-19 DIAGNOSIS — N3281 Overactive bladder: Secondary | ICD-10-CM

## 2021-07-19 DIAGNOSIS — I10 Essential (primary) hypertension: Secondary | ICD-10-CM

## 2021-07-19 DIAGNOSIS — E0590 Thyrotoxicosis, unspecified without thyrotoxic crisis or storm: Secondary | ICD-10-CM

## 2021-07-19 NOTE — Progress Notes (Signed)
Subjective: CC: Hypothyroidism, hyperlipidemia PCP: Janora Norlander, DO JJH:ERDEYC Stacie Russell is a 70 y.o. female presenting to clinic today for:  1.  Hypothyroidism Compliant with thyroid replacement.  No reports of tremor, heart palpitations or change in bowel habit but she has had some decreased energy.  She has completed her treatment for her cancer.  Recent CT scan showed no new lesions  2.  Hyperlipidemia with hypertension Patient is compliant with her medications.  Denies any chest pain, shortness of breath.  3.  Endometrial cancer She has completed her treatment for the cancer as above and has follow-up with her specialist within the next week or so.  She does report some scant pink discoloration noted when wiping recently but that has since resolved.  She is uncertain as to the etiology.  She denies any dysuria, hematuria, increased urinary frequency or urgency.  No pelvic pain or fevers reported.  ROS: Per HPI  Allergies  Allergen Reactions   Tramadol Other (See Comments)    Unsure of reaction   Past Medical History:  Diagnosis Date   Arthritis    Asthma    Diverticulosis    Dyspnea    with exertion    Endometrial hyperplasia    Esophageal stricture    Family history of breast cancer 01/26/2021   Foot pain    Fundic gland polyps of stomach, benign 07/10/2015   Gastritis    GERD (gastroesophageal reflux disease)    Hemorrhoid    Hiatal hernia    Hip pain    History of kidney stones    cystoscopy   History of surgery on arm    left humerus   Hyperlipidemia    Hypertension    Hypothyroidism    Knee pain    Metabolic syndrome    Obstructive sleep apnea 08/27/2017   pt denies    Oxygen deficiency    pt denies   PONV (postoperative nausea and vomiting)    Spinal headache    few headaches recently    Tendonitis of ankle    Vitamin D deficiency     Current Outpatient Medications:    acetaminophen (TYLENOL) 500 MG tablet, Take 1,000 mg by mouth as  needed (pain.)., Disp: , Rfl:    albuterol (VENTOLIN HFA) 108 (90 Base) MCG/ACT inhaler, Inhale 2 puffs into the lungs every 6 (six) hours as needed for wheezing or shortness of breath. (Patient taking differently: Inhale 2 puffs into the lungs as needed for wheezing or shortness of breath. PRN), Disp: 6.7 g, Rfl: 1   budesonide-formoterol (SYMBICORT) 80-4.5 MCG/ACT inhaler, Inhale 2 puffs into the lungs 2 (two) times daily. (Patient taking differently: Inhale 2 puffs into the lungs as needed (respiratory issues.).), Disp: 4 each, Rfl: 11   Calcium Carbonate-Vit D-Min (CALCIUM 1200 PO), Take 1 tablet by mouth in the morning., Disp: , Rfl:    Cholecalciferol (VITAMIN D3) 2000 units TABS, Take 2,000 Units by mouth in the morning., Disp: , Rfl:    docusate sodium (COLACE) 100 MG capsule, Take 100 mg by mouth 2 (two) times daily as needed (constipation.)., Disp: , Rfl:    DULoxetine (CYMBALTA) 30 MG capsule, TAKE 1 CAPSULE BY MOUTH  DAILY, Disp: 90 capsule, Rfl: 0   esomeprazole (NEXIUM) 40 MG capsule, Take 1 capsule (40 mg total) by mouth daily before breakfast., Disp: 90 capsule, Rfl: 3   furosemide (LASIX) 20 MG tablet, Take 1 tablet (20 mg total) by mouth daily as needed for fluid. (Patient taking  differently: Take 20 mg by mouth as needed for fluid. PRN), Disp: 90 tablet, Rfl: 1   lactulose (CHRONULAC) 10 GM/15ML solution, Take 15 mLs (10 g total) by mouth 3 (three) times daily., Disp: 473 mL, Rfl: 0   levothyroxine (SYNTHROID) 50 MCG tablet, TAKE 1 TABLET BY MOUTH  DAILY BEFORE BREAKFAST (Patient taking differently: Take 50 mcg by mouth daily before breakfast.), Disp: 90 tablet, Rfl: 3   lidocaine-prilocaine (EMLA) cream, Apply to affected area once, Disp: 30 g, Rfl: 3   losartan-hydrochlorothiazide (HYZAAR) 100-12.5 MG tablet, TAKE 1 TABLET BY MOUTH  DAILY, Disp: 90 tablet, Rfl: 0   Omega-3 Fatty Acids (FISH OIL) 1000 MG CAPS, Take 1,000 mg by mouth in the morning., Disp: , Rfl:    oxybutynin  (DITROPAN) 5 MG tablet, Take 2 tablets (10 mg total) by mouth 2 (two) times daily., Disp: 360 tablet, Rfl: 3   Plecanatide (TRULANCE) 3 MG TABS, Take 1 tablet by mouth daily., Disp: 8 tablet, Rfl: 0   valACYclovir (VALTREX) 1000 MG tablet, Take 1 tablet twice daily for 7 days.  If you have another flare, take 2 tablets twice a day for ONE day only per flareup. (Patient taking differently: Take 500 mg by mouth 2 (two) times daily as needed (fever blisters). PRN), Disp: 30 tablet, Rfl: 0   vitamin B-12 (CYANOCOBALAMIN) 1000 MCG tablet, Take 1,000 mcg by mouth in the morning., Disp: , Rfl:  Social History   Socioeconomic History   Marital status: Married    Spouse name: Marcello Moores   Number of children: 2   Years of education: 14   Highest education level: Associate degree: academic program  Occupational History   Occupation: Retired  Tobacco Use   Smoking status: Never   Smokeless tobacco: Never  Scientific laboratory technician Use: Never used  Substance and Sexual Activity   Alcohol use: No   Drug use: No   Sexual activity: Not Currently    Birth control/protection: Post-menopausal  Other Topics Concern   Not on file  Social History Narrative   Married and retired   No EtOH, tobacco or drug use   Social Determinants of Radio broadcast assistant Strain: Low Risk    Difficulty of Paying Living Expenses: Not hard at all  Food Insecurity: No Food Insecurity   Worried About Charity fundraiser in the Last Year: Never true   Arboriculturist in the Last Year: Never true  Transportation Needs: No Transportation Needs   Lack of Transportation (Medical): No   Lack of Transportation (Non-Medical): No  Physical Activity: Inactive   Days of Exercise per Week: 0 days   Minutes of Exercise per Session: 0 min  Stress: Stress Concern Present   Feeling of Stress : To some extent  Social Connections: Engineer, building services of Communication with Friends and Family: More than three times a week    Frequency of Social Gatherings with Friends and Family: More than three times a week   Attends Religious Services: More than 4 times per year   Active Member of [employer] or Organizations: Yes   Attends Archivist Meetings: 1 to 4 times per year   Marital Status: Married  Human resources officer Violence: Not At Risk   Fear of Current or Ex-Partner: No   Emotionally Abused: No   Physically Abused: No   Sexually Abused: No   Family History  Problem Relation Age of Onset   Colon cancer Mother 17  Heart attack Father    Heart disease Sister    Breast cancer Sister 13   Breast cancer Sister        dx 41s   Cancer Maternal Aunt 76       "female cancer", unknown type   Cancer Maternal Grandmother        unknown type; dx 6s   Cancer Maternal Grandfather        unknown type; dx 71s   Cancer Cousin        maternal female cousin; dx 60s; unknown type   Cancer Cousin        maternal female cousin; dx 29s; unknown type   Cancer Cousin        maternal female cousin; dx after 20; unknown type   Esophageal cancer Neg Hx    Stomach cancer Neg Hx    Rectal cancer Neg Hx     Objective: Office vital signs reviewed. BP (!) 148/91   Pulse 67   Temp 98.1 F (36.7 C)   Ht 5\' 1"  (1.549 m)   Wt 215 lb (97.5 kg)   SpO2 98%   BMI 40.62 kg/m   Physical Examination:  General: Awake, alert, drug-induced alopecia noted, No acute distress HEENT: Sclera white.  Moist mucous membranes.  No exophthalmos or goiter Cardio: regular rate and rhythm, S1S2 heard, no murmurs appreciated Pulm: clear to auscultation bilaterally, no wheezes, rhonchi or rales; normal work of breathing on room air MSK: Normal gait and station Neuro: No tremor  Assessment/ Plan: 70 y.o. female   Acquired hypothyroidism - Plan: TSH, T4, Free, CANCELED: TSH, CANCELED: T4, Free  Endometrial cancer ([place])  Essential hypertension, benign  Mixed hyperlipidemia - Plan: Lipid Panel, CANCELED: Lipid Panel  Degeneration of  lumbar intervertebral disc  Hiatal hernia  Aortic atherosclerosis ([place])  We will plan for free T4 and TSH along with her labs that are to be collected by oncology next week.  Continue following up with oncology as directed for endometrial cancer.  She made mention of some scant bleeding noticed on wiping.  She had no urinary tract symptoms to suggest infection.  Recommended ongoing follow-up with her specialist  Blood pressure is borderline, specifically diastolic blood pressure.  We will continue to monitor but may need to consider advancing the losartan to 100-25 mg daily.  Fasting lipid panel to be obtained with labs with oncology.  Orders have already been placed.  She is on OTC fish oil  Degenerative changes within the lumbar spine noted on recent CT scan.  These are known and are only mildly symptomatic at this time.  We discussed referral if symptoms advance.    Hiatal hernia grade 1 also noted on imaging study as well as aortic atherosclerosis.  No orders of the defined types were placed in this encounter.  No orders of the defined types were placed in this encounter.    Janora Norlander, DO [place] [phone]

## 2021-07-26 DIAGNOSIS — M6281 Muscle weakness (generalized): Secondary | ICD-10-CM | POA: Diagnosis not present

## 2021-07-27 DIAGNOSIS — M6281 Muscle weakness (generalized): Secondary | ICD-10-CM | POA: Diagnosis not present

## 2021-07-31 ENCOUNTER — Telehealth: Payer: Self-pay | Admitting: Oncology

## 2021-07-31 ENCOUNTER — Inpatient Hospital Stay (HOSPITAL_BASED_OUTPATIENT_CLINIC_OR_DEPARTMENT_OTHER): Payer: Medicare Other | Admitting: Hematology and Oncology

## 2021-07-31 ENCOUNTER — Other Ambulatory Visit: Payer: Self-pay

## 2021-07-31 ENCOUNTER — Encounter: Payer: Self-pay | Admitting: Hematology and Oncology

## 2021-07-31 ENCOUNTER — Inpatient Hospital Stay: Payer: Medicare Other | Attending: Gynecologic Oncology

## 2021-07-31 DIAGNOSIS — Z79899 Other long term (current) drug therapy: Secondary | ICD-10-CM | POA: Insufficient documentation

## 2021-07-31 DIAGNOSIS — Z8249 Family history of ischemic heart disease and other diseases of the circulatory system: Secondary | ICD-10-CM | POA: Diagnosis not present

## 2021-07-31 DIAGNOSIS — Z87442 Personal history of urinary calculi: Secondary | ICD-10-CM | POA: Insufficient documentation

## 2021-07-31 DIAGNOSIS — C541 Malignant neoplasm of endometrium: Secondary | ICD-10-CM

## 2021-07-31 DIAGNOSIS — E785 Hyperlipidemia, unspecified: Secondary | ICD-10-CM | POA: Insufficient documentation

## 2021-07-31 DIAGNOSIS — I700 Atherosclerosis of aorta: Secondary | ICD-10-CM | POA: Diagnosis not present

## 2021-07-31 DIAGNOSIS — Z8719 Personal history of other diseases of the digestive system: Secondary | ICD-10-CM | POA: Diagnosis not present

## 2021-07-31 DIAGNOSIS — I10 Essential (primary) hypertension: Secondary | ICD-10-CM | POA: Diagnosis not present

## 2021-07-31 DIAGNOSIS — I2510 Atherosclerotic heart disease of native coronary artery without angina pectoris: Secondary | ICD-10-CM | POA: Diagnosis not present

## 2021-07-31 DIAGNOSIS — Z800 Family history of malignant neoplasm of digestive organs: Secondary | ICD-10-CM | POA: Diagnosis not present

## 2021-07-31 DIAGNOSIS — Z803 Family history of malignant neoplasm of breast: Secondary | ICD-10-CM | POA: Diagnosis not present

## 2021-07-31 DIAGNOSIS — K760 Fatty (change of) liver, not elsewhere classified: Secondary | ICD-10-CM | POA: Diagnosis not present

## 2021-07-31 DIAGNOSIS — R32 Unspecified urinary incontinence: Secondary | ICD-10-CM | POA: Diagnosis not present

## 2021-07-31 DIAGNOSIS — E0590 Thyrotoxicosis, unspecified without thyrotoxic crisis or storm: Secondary | ICD-10-CM

## 2021-07-31 DIAGNOSIS — Z809 Family history of malignant neoplasm, unspecified: Secondary | ICD-10-CM | POA: Diagnosis not present

## 2021-07-31 DIAGNOSIS — E782 Mixed hyperlipidemia: Secondary | ICD-10-CM

## 2021-07-31 LAB — LIPID PANEL
Cholesterol: 199 mg/dL (ref 0–200)
HDL: 57 mg/dL (ref >40)
LDL Cholesterol: 124 mg/dL — ABNORMAL HIGH (ref 0–99)
Total CHOL/HDL Ratio: 3.5 RATIO
Triglycerides: 89 mg/dL (ref <150)
VLDL: 18 mg/dL (ref 0–40)

## 2021-07-31 LAB — CBC WITH DIFFERENTIAL (CANCER CENTER ONLY)
Abs Immature Granulocytes: 0.01 10*3/uL (ref 0.00–0.07)
Basophils Absolute: 0 10*3/uL (ref 0.0–0.1)
Basophils Relative: 0 %
Eosinophils Absolute: 0.2 10*3/uL (ref 0.0–0.5)
Eosinophils Relative: 3 %
HCT: 34.7 % — ABNORMAL LOW (ref 36.0–46.0)
Hemoglobin: 11.5 g/dL — ABNORMAL LOW (ref 12.0–15.0)
Immature Granulocytes: 0 %
Lymphocytes Relative: 26 %
Lymphs Abs: 1.3 10*3/uL (ref 0.7–4.0)
MCH: 32.8 pg (ref 26.0–34.0)
MCHC: 33.1 g/dL (ref 30.0–36.0)
MCV: 98.9 fL (ref 80.0–100.0)
Monocytes Absolute: 0.4 10*3/uL (ref 0.1–1.0)
Monocytes Relative: 7 %
Neutro Abs: 3.1 10*3/uL (ref 1.7–7.7)
Neutrophils Relative %: 64 %
Platelet Count: 195 10*3/uL (ref 150–400)
RBC: 3.51 MIL/uL — ABNORMAL LOW (ref 3.87–5.11)
RDW: 12.8 % (ref 11.5–15.5)
WBC Count: 4.9 10*3/uL (ref 4.0–10.5)
nRBC: 0 % (ref 0.0–0.2)

## 2021-07-31 LAB — CMP (CANCER CENTER ONLY)
ALT: 15 U/L (ref 0–44)
AST: 14 U/L — ABNORMAL LOW (ref 15–41)
Albumin: 3.7 g/dL (ref 3.5–5.0)
Alkaline Phosphatase: 87 U/L (ref 38–126)
Anion gap: 8 (ref 5–15)
BUN: 13 mg/dL (ref 8–23)
CO2: 27 mmol/L (ref 22–32)
Calcium: 9 mg/dL (ref 8.9–10.3)
Chloride: 107 mmol/L (ref 98–111)
Creatinine: 0.7 mg/dL (ref 0.44–1.00)
GFR, Estimated: >60 mL/min (ref >60)
Glucose, Bld: 91 mg/dL (ref 70–99)
Potassium: 3.7 mmol/L (ref 3.5–5.1)
Sodium: 142 mmol/L (ref 135–145)
Total Bilirubin: 0.5 mg/dL (ref 0.3–1.2)
Total Protein: 6.4 g/dL — ABNORMAL LOW (ref 6.5–8.1)

## 2021-07-31 LAB — TSH: TSH: 1.297 u[IU]/mL (ref 0.308–3.960)

## 2021-07-31 LAB — T4, FREE: Free T4: 1.04 ng/dL (ref 0.61–1.12)

## 2021-07-31 MED ORDER — HEPARIN SOD (PORK) LOCK FLUSH 100 UNIT/ML IV SOLN
500.0000 [IU] | Freq: Once | INTRAVENOUS | Status: AC
  Administered 2021-07-31: 11:00:00 500 [IU]

## 2021-07-31 MED ORDER — SODIUM CHLORIDE 0.9% FLUSH
10.0000 mL | Freq: Once | INTRAVENOUS | Status: AC
Start: 2021-07-31 — End: 2021-07-31
  Administered 2021-07-31: 11:00:00 10 mL

## 2021-07-31 NOTE — Assessment & Plan Note (Signed)
Her last imaging study showed complete response to chemotherapy The patient is at high risk of cancer recurrence due to the stage of her disease We discussed the importance of dietary modification and weight loss With her recent abnormal discharge, I recommend GYN oncology follow-up and we will try to schedule that for next month From my perspective, I plan to see her within the next 2 months for repeat assessment and port flush

## 2021-07-31 NOTE — Telephone Encounter (Signed)
Notified Stacie Russell of follow up appointment with Dr. Berline Lopes on 08/18/21 per Dr. Alvy Bimler.  She asked if appointment can be before the end of the year.  Rescheduled appointment to 08/04/21. She verbalized understanding and agreement.

## 2021-07-31 NOTE — Progress Notes (Signed)
[place] OFFICE PROGRESS NOTE  Patient Care Team: Janora Norlander, DO as PCP - General (Family Medicine) Lorretta Harp, MD as PCP - Cardiology (Cardiology) Irine Seal, MD (Urology) Paula Compton, MD (Obstetrics and Gynecology) Gatha Mayer, MD (Gastroenterology) Gaynelle Arabian, MD as Consulting Physician (Orthopedic Surgery) Heath Lark, MD as Consulting Physician (Hematology and Oncology)  ASSESSMENT & PLAN:  Endometrial cancer [facility]) Her last imaging study showed complete response to chemotherapy The patient is at high risk of cancer recurrence due to the stage of her disease We discussed the importance of dietary modification and weight loss With her recent abnormal discharge, I recommend GYN oncology follow-up and we will try to schedule that for next month From my perspective, I plan to see her within the next 2 months for repeat assessment and port flush  Obesity, Class III, BMI 40-49.9 (morbid obesity) ([place]) She has successfully lost some weight She will continue dietary modification, physical therapy and rehab  No orders of the defined types were placed in this encounter.   All questions were answered. The patient knows to call the clinic with any problems, questions or concerns. The total time spent in the appointment was 20 minutes encounter with patients including review of chart and various tests results, discussions about plan of care and coordination of care plan   Heath Lark, MD 07/31/2021 1:40 PM  INTERVAL HISTORY: Please see below for problem oriented charting. she returns for surveillance follow-up after completion of treatment for uterine cancer She is doing well She is getting stronger She is exercising Approximately a month ago, she had 1 time vaginal discharge/bleeding that has since resolved She denies recent changes in bowel habits  REVIEW OF SYSTEMS:   Constitutional: Denies fevers, chills or abnormal weight  loss Eyes: Denies blurriness of vision Ears, nose, mouth, throat, and face: Denies mucositis or sore throat Respiratory: Denies cough, dyspnea or wheezes Cardiovascular: Denies palpitation, chest discomfort or lower extremity swelling Gastrointestinal:  Denies nausea, heartburn or change in bowel habits Skin: Denies abnormal skin rashes Lymphatics: Denies new lymphadenopathy or easy bruising Neurological:Denies numbness, tingling or new weaknesses Behavioral/Psych: Mood is stable, no new changes  All other systems were reviewed with the patient and are negative.  I have reviewed the past medical history, past surgical history, social history and family history with the patient and they are unchanged from previous note.  ALLERGIES:  is allergic to tramadol.  MEDICATIONS:  Current Outpatient Medications  Medication Sig Dispense Refill   acetaminophen (TYLENOL) 500 MG tablet Take 1,000 mg by mouth as needed (pain.).     albuterol (VENTOLIN HFA) 108 (90 Base) MCG/ACT inhaler Inhale 2 puffs into the lungs every 6 (six) hours as needed for wheezing or shortness of breath. (Patient taking differently: Inhale 2 puffs into the lungs as needed for wheezing or shortness of breath. PRN) 6.7 g 1   budesonide-formoterol (SYMBICORT) 80-4.5 MCG/ACT inhaler Inhale 2 puffs into the lungs 2 (two) times daily. (Patient taking differently: Inhale 2 puffs into the lungs as needed (respiratory issues.).) 4 each 11   Calcium Carbonate-Vit D-Min (CALCIUM 1200 PO) Take 1 tablet by mouth in the morning.     Cholecalciferol (VITAMIN D3) 2000 units TABS Take 2,000 Units by mouth in the morning.     docusate sodium (COLACE) 100 MG capsule Take 100 mg by mouth 2 (two) times daily as needed (constipation.).     DULoxetine (CYMBALTA) 30 MG capsule TAKE 1 CAPSULE BY MOUTH  DAILY  90 capsule 0   esomeprazole (NEXIUM) 40 MG capsule Take 1 capsule (40 mg total) by mouth daily before breakfast. 90 capsule 3   furosemide (LASIX)  20 MG tablet Take 1 tablet (20 mg total) by mouth daily as needed for fluid. (Patient taking differently: Take 20 mg by mouth as needed for fluid. PRN) 90 tablet 1   lactulose (CHRONULAC) 10 GM/15ML solution Take 15 mLs (10 g total) by mouth 3 (three) times daily. 473 mL 0   levothyroxine (SYNTHROID) 50 MCG tablet TAKE 1 TABLET BY MOUTH  DAILY BEFORE BREAKFAST (Patient taking differently: Take 50 mcg by mouth daily before breakfast.) 90 tablet 3   lidocaine-prilocaine (EMLA) cream Apply to affected area once 30 g 3   losartan-hydrochlorothiazide (HYZAAR) 100-12.5 MG tablet TAKE 1 TABLET BY MOUTH  DAILY 90 tablet 0   Omega-3 Fatty Acids (FISH OIL) 1000 MG CAPS Take 1,000 mg by mouth in the morning.     oxybutynin (DITROPAN) 5 MG tablet TAKE 2 TABLETS BY MOUTH  TWICE DAILY 360 tablet 1   Plecanatide (TRULANCE) 3 MG TABS Take 1 tablet by mouth daily. 8 tablet 0   valACYclovir (VALTREX) 1000 MG tablet Take 1 tablet twice daily for 7 days.  If you have another flare, take 2 tablets twice a day for ONE day only per flareup. (Patient taking differently: Take 500 mg by mouth 2 (two) times daily as needed (fever blisters). PRN) 30 tablet 0   vitamin B-12 (CYANOCOBALAMIN) 1000 MCG tablet Take 1,000 mcg by mouth in the morning.     No current facility-administered medications for this visit.    SUMMARY OF ONCOLOGIC HISTORY: Oncology History Overview Note  MSI stable, endometrioid grade 2 Neg genetics   Endometrial cancer ([place])  12/19/2020 Initial Diagnosis   Endometrial cancer ([place])   12/26/2020 Imaging   1. Lower uterine segment/cervical mass without evidence of metastatic disease. 2. Hepatic steatosis. 3. Aortic atherosclerosis (ICD10-I70.0). Coronary artery calcification.   12/29/2020 Pathology Results   FINAL MICROSCOPIC DIAGNOSIS:   A. SENTINEL LYMPH NODE, RIGHT OBTURATOR, EXCISION:  -  No carcinoma identified in one lymph node (0/1)  -  See comment   B. SENTINEL LYMPH NODE, LEFT OBTURATOR,  EXCISION:  -  Micrometastasis involving one lymph node (15m/1)  -  See comment   C. UTERUS, CERVIX, BILATERAL TUBES AND OVARIES:  Uterus:  -  Endometrioid carcinoma, FIGO grade 2  -  See oncology table and comment below   Cervix:  -  No carcinoma identified   Bilateral Ovaries:  -  No carcinoma identified   Bilateral Fallopian tubes:  -  No carcinoma identified   ONCOLOGY TABLE:   UTERUS, CARCINOMA OR CARCINOSARCOMA: Resection   Procedure: Total hysterectomy and bilateral salpingo-oophorectomy  Histologic Type: Endometrioid carcinoma  Histologic Grade: FIGO grade 2  Myometrial Invasion:       Percentage of Myometrial Invasion: Estimated to be less than 50%  Uterine Serosa Involvement: Not identified  Cervical stromal Involvement: Not identified  Extent of involvement of other tissue/organs: Not identified  Peritoneal/Ascitic Fluid: Not submitted/unknown  Lymphovascular Invasion: Not identified  Regional Lymph Nodes:       Pelvic Lymph Nodes Examined:  2 Sentinel                                0 non-sentinel  2 total       Pelvic Lymph Nodes with Metastasis: 0                           Macrometastasis: (>2.0 mm): 0                          Micrometastasis: (>0.2 mm and < 2.0 mm): 1                            Isolated Tumor Cells (<0.2 mm): 0                       Laterality of Lymph Node with Tumor: 0                             Extracapsular Extension: N/A  Distant Metastasis:       Distant Site(s) Involved: N/A  Pathologic Stage Classification (pTNM, AJCC 8th Edition): pT1a, pN71mi  Ancillary Studies: MMR / MSI testing will be ordered  Representative Tumor Block: C1  Comment(s): Cytokeratin AE1/3 was performed on the lymph nodes (parts a and B) and supports the presence of micrometastasis (part B only). Drs. Saralyn Pilar and [facility] reviewed select parts of the case and agree with the above diagnosis.   (v4.2.0.1)     12/29/2020 Surgery    Surgeon: Donaciano Eva Pre-operative Diagnosis: endometrial cancer grade 3    Operation: Robotic-assisted laparoscopic total hysterectomy with bilateral salpingoophorectomy, SLN biopsy   Surgeon: Donaciano Eva  Specimens: uterus, cervix, bilateral tubes and ovaries, right and left obturator SLN           01/22/2021 Cancer Staging   Staging form: Corpus Uteri - Carcinoma and Carcinosarcoma, AJCC 8th Edition - Pathologic stage from 01/22/2021: Stage IIIC1 (pT1a, pN41mi, cM0) - Signed by Heath Lark, MD on 01/22/2021 Stage prefix: Initial diagnosis    01/25/2021 Procedure   Successful placement of a right IJ approach Power Port with ultrasound and fluoroscopic guidance. The catheter is ready for use.   02/07/2021 - 05/19/2021 Chemotherapy   Patient is on Treatment Plan : UTERINE Carboplatin AUC 6 / Paclitaxel q21d     02/11/2021 Genetic Testing   Negative hereditary cancer genetic testing: no pathogenic variants detected in [facility] Common Hereditary Cancers +RNA Panel.  The report date is February 11, 2021.    The Common Hereditary Cancers + RNA Panel offered by [facility] includes sequencing, deletion/duplication, and RNA testing of the following 47 genes: APC, ATM, AXIN2, BARD1, BMPR1A, BRCA1, BRCA2, BRIP1, CDH1, CDK4*, CDKN2A (p14ARF)*, CDKN2A (p16INK4a)*, CHEK2, CTNNA1, DICER1, EPCAM (Deletion/duplication testing only), GREM1 (promoter region deletion/duplication testing only), KIT, MEN1, MLH1, MSH2, MSH3, MSH6, MUTYH, NBN, NF1, NHTL1, PALB2, PDGFRA*, PMS2, POLD1, POLE, PTEN, RAD50, RAD51C, RAD51D, SDHB, SDHC, SDHD, SMAD4, SMARCA4. STK11, TP53, TSC1, TSC2, and VHL.  The following genes were evaluated for sequence changes only: SDHA and HOXB13 c.251G>A variant only.  RNA analysis is not performed for the * genes.     06/14/2021 Imaging   1. Interval hysterectomy and bilateral salpingo oophorectomy. No findings of residual/recurrent malignancy. 2. Substantial lumbar spondylosis and  degenerative disc disease. In addition, a right eccentric intervertebral spur at T8-9 causes prominent central narrowing of the thecal sac. 3. Other imaging findings of potential clinical significance: Aortic Atherosclerosis (ICD10-I70.0). Systemic atherosclerosis. Small type 1 hiatal  hernia.       PHYSICAL EXAMINATION: ECOG PERFORMANCE STATUS: 1 - Symptomatic but completely ambulatory  Vitals:   07/31/21 1116  BP: 124/71  Pulse: 81  Resp: 18  Temp: (!) 97.5 F (36.4 C)  SpO2: 96%   Filed Weights   07/31/21 1116  Weight: 213 lb 9.6 oz (96.9 kg)    GENERAL:alert, no distress and comfortable SKIN: skin color, texture, turgor are normal, no rashes or significant lesions EYES: normal, Conjunctiva are pink and non-injected, sclera clear OROPHARYNX:no exudate, no erythema and lips, buccal mucosa, and tongue normal  NECK: supple, thyroid normal size, non-tender, without nodularity LYMPH:  no palpable lymphadenopathy in the cervical, axillary or inguinal LUNGS: clear to auscultation and percussion with normal breathing effort HEART: regular rate & rhythm and no murmurs and no lower extremity edema ABDOMEN:abdomen soft, non-tender and normal bowel sounds Musculoskeletal:no cyanosis of digits and no clubbing  NEURO: alert & oriented x 3 with fluent speech, no focal motor/sensory deficits  LABORATORY DATA:  I have reviewed the data as listed    Component Value Date/Time   NA 142 07/31/2021 1046   NA 141 08/31/2020 1149   K 3.7 07/31/2021 1046   CL 107 07/31/2021 1046   CO2 27 07/31/2021 1046   GLUCOSE 91 07/31/2021 1046   BUN 13 07/31/2021 1046   BUN 15 08/31/2020 1149   CREATININE 0.70 07/31/2021 1046   CREATININE 0.60 11/03/2012 1228   CALCIUM 9.0 07/31/2021 1046   PROT 6.4 (L) 07/31/2021 1046   PROT 7.3 08/31/2020 1149   ALBUMIN 3.7 07/31/2021 1046   ALBUMIN 4.7 08/31/2020 1149   AST 14 (L) 07/31/2021 1046   ALT 15 07/31/2021 1046   ALKPHOS 87 07/31/2021 1046    BILITOT 0.5 07/31/2021 1046   GFRNONAA >60 07/31/2021 1046   GFRAA 91 08/31/2020 1149    No results found for: SPEP, UPEP  Lab Results  Component Value Date   WBC 4.9 07/31/2021   NEUTROABS 3.1 07/31/2021   HGB 11.5 (L) 07/31/2021   HCT 34.7 (L) 07/31/2021   MCV 98.9 07/31/2021   PLT 195 07/31/2021      Chemistry      Component Value Date/Time   NA 142 07/31/2021 1046   NA 141 08/31/2020 1149   K 3.7 07/31/2021 1046   CL 107 07/31/2021 1046   CO2 27 07/31/2021 1046   BUN 13 07/31/2021 1046   BUN 15 08/31/2020 1149   CREATININE 0.70 07/31/2021 1046   CREATININE 0.60 11/03/2012 1228   GLU 81 07/24/2012 0000      Component Value Date/Time   CALCIUM 9.0 07/31/2021 1046   ALKPHOS 87 07/31/2021 1046   AST 14 (L) 07/31/2021 1046   ALT 15 07/31/2021 1046   BILITOT 0.5 07/31/2021 1046

## 2021-07-31 NOTE — Assessment & Plan Note (Signed)
She has successfully lost some weight She will continue dietary modification, physical therapy and rehab

## 2021-08-03 ENCOUNTER — Encounter: Payer: Self-pay | Admitting: Gynecologic Oncology

## 2021-08-03 DIAGNOSIS — M6281 Muscle weakness (generalized): Secondary | ICD-10-CM | POA: Diagnosis not present

## 2021-08-04 ENCOUNTER — Encounter: Payer: Self-pay | Admitting: Gynecologic Oncology

## 2021-08-04 ENCOUNTER — Inpatient Hospital Stay (HOSPITAL_BASED_OUTPATIENT_CLINIC_OR_DEPARTMENT_OTHER): Payer: Medicare Other | Admitting: Gynecologic Oncology

## 2021-08-04 ENCOUNTER — Other Ambulatory Visit: Payer: Self-pay

## 2021-08-04 VITALS — BP 174/74 | HR 79 | Temp 97.6°F | Resp 16 | Ht 61.0 in | Wt 213.8 lb

## 2021-08-04 DIAGNOSIS — I10 Essential (primary) hypertension: Secondary | ICD-10-CM | POA: Diagnosis not present

## 2021-08-04 DIAGNOSIS — Z8249 Family history of ischemic heart disease and other diseases of the circulatory system: Secondary | ICD-10-CM | POA: Diagnosis not present

## 2021-08-04 DIAGNOSIS — N952 Postmenopausal atrophic vaginitis: Secondary | ICD-10-CM

## 2021-08-04 DIAGNOSIS — C541 Malignant neoplasm of endometrium: Secondary | ICD-10-CM | POA: Diagnosis not present

## 2021-08-04 DIAGNOSIS — I2510 Atherosclerotic heart disease of native coronary artery without angina pectoris: Secondary | ICD-10-CM | POA: Diagnosis not present

## 2021-08-04 DIAGNOSIS — Z87442 Personal history of urinary calculi: Secondary | ICD-10-CM | POA: Diagnosis not present

## 2021-08-04 DIAGNOSIS — Z809 Family history of malignant neoplasm, unspecified: Secondary | ICD-10-CM | POA: Diagnosis not present

## 2021-08-04 DIAGNOSIS — I700 Atherosclerosis of aorta: Secondary | ICD-10-CM | POA: Diagnosis not present

## 2021-08-04 DIAGNOSIS — Z803 Family history of malignant neoplasm of breast: Secondary | ICD-10-CM | POA: Diagnosis not present

## 2021-08-04 DIAGNOSIS — Z8719 Personal history of other diseases of the digestive system: Secondary | ICD-10-CM | POA: Diagnosis not present

## 2021-08-04 DIAGNOSIS — Z800 Family history of malignant neoplasm of digestive organs: Secondary | ICD-10-CM | POA: Diagnosis not present

## 2021-08-04 DIAGNOSIS — L299 Pruritus, unspecified: Secondary | ICD-10-CM | POA: Diagnosis not present

## 2021-08-04 DIAGNOSIS — Z79899 Other long term (current) drug therapy: Secondary | ICD-10-CM | POA: Diagnosis not present

## 2021-08-04 DIAGNOSIS — E785 Hyperlipidemia, unspecified: Secondary | ICD-10-CM | POA: Diagnosis not present

## 2021-08-04 DIAGNOSIS — K760 Fatty (change of) liver, not elsewhere classified: Secondary | ICD-10-CM | POA: Diagnosis not present

## 2021-08-04 MED ORDER — NYSTATIN 100000 UNIT/GM EX POWD
1.0000 | Freq: Three times a day (TID) | CUTANEOUS | 1 refills | Status: DC
Start: 2021-08-04 — End: 2021-10-02

## 2021-08-04 MED ORDER — ESTRADIOL 0.1 MG/GM VA CREA
1.0000 | TOPICAL_CREAM | VAGINAL | 12 refills | Status: DC
Start: 2021-08-04 — End: 2022-07-03

## 2021-08-04 NOTE — Patient Instructions (Addendum)
Dr. Berline Lopes has sent in estrace vaginal cream to use three times a week. She also sent in nystatin powder to use in your groin, under abdominal skin folds to treat yeast.  Plan to follow up in three months or sooner if needed. Please call for any new, persistent symptoms.

## 2021-08-04 NOTE — Progress Notes (Signed)
Gynecologic Oncology Return Clinic Visit  08/04/2021  Reason for Visit: Follow-up visit after completion of adjuvant treatment in the setting of high risk uterine cancer  Treatment History: Oncology History Overview Note  MSI stable, endometrioid grade 2 Neg genetics   Endometrial cancer (Stacie Russell)  12/19/2020 Initial Diagnosis   Endometrial cancer (Stacie Russell)   12/26/2020 Imaging   1. Lower uterine segment/cervical mass without evidence of metastatic disease. 2. Hepatic steatosis. 3. Aortic atherosclerosis (ICD10-I70.0). Coronary artery calcification.   12/29/2020 Pathology Results   FINAL MICROSCOPIC DIAGNOSIS:   A. SENTINEL LYMPH NODE, RIGHT OBTURATOR, EXCISION:  -  No carcinoma identified in one lymph node (0/1)  -  See comment   B. SENTINEL LYMPH NODE, LEFT OBTURATOR, EXCISION:  -  Micrometastasis involving one lymph node (37m/1)  -  See comment   C. UTERUS, CERVIX, BILATERAL TUBES AND OVARIES:  Uterus:  -  Endometrioid carcinoma, FIGO grade 2  -  See oncology table and comment below   Cervix:  -  No carcinoma identified   Bilateral Ovaries:  -  No carcinoma identified   Bilateral Fallopian tubes:  -  No carcinoma identified   ONCOLOGY TABLE:   UTERUS, CARCINOMA OR CARCINOSARCOMA: Resection   Procedure: Total hysterectomy and bilateral salpingo-oophorectomy  Histologic Type: Endometrioid carcinoma  Histologic Grade: FIGO grade 2  Myometrial Invasion:       Percentage of Myometrial Invasion: Estimated to be less than 50%  Uterine Serosa Involvement: Not identified  Cervical stromal Involvement: Not identified  Extent of involvement of other tissue/organs: Not identified  Peritoneal/Ascitic Fluid: Not submitted/unknown  Lymphovascular Invasion: Not identified  Regional Lymph Nodes:       Pelvic Lymph Nodes Examined:  2 Sentinel                                0 non-sentinel                                   2 total       Pelvic Lymph Nodes with Metastasis: 0                            Macrometastasis: (>2.0 mm): 0                          Micrometastasis: (>0.2 mm and < 2.0 mm): 1                            Isolated Tumor Cells (<0.2 mm): 0                       Laterality of Lymph Node with Tumor: 0                             Extracapsular Extension: N/A  Distant Metastasis:       Distant Site(s) Involved: N/A  Pathologic Stage Classification (pTNM, AJCC 8th Edition): pT1a, pN132m Ancillary Studies: MMR / MSI testing will be ordered  Representative Tumor Block: C1  Comment(s): Cytokeratin AE1/3 was performed on the lymph nodes (parts a and B) and supports the presence of micrometastasis (part B only). Drs. [facility]Saralyn Pilarnd [facility]ed select parts  of the case and agree with the above diagnosis.   ([id number])     12/29/2020 Surgery   Surgeon: Donaciano Eva Pre-operative Diagnosis: endometrial cancer grade 3    Operation: Robotic-assisted laparoscopic total hysterectomy with bilateral salpingoophorectomy, SLN biopsy   Surgeon: Donaciano Eva  Specimens: uterus, cervix, bilateral tubes and ovaries, right and left obturator SLN           01/22/2021 Cancer Staging   Staging form: Corpus Uteri - Carcinoma and Carcinosarcoma, AJCC 8th Edition - Pathologic stage from 01/22/2021: Stage IIIC1 (pT1a, pN61m, cM0) - Signed by GHeath Lark MD on 01/22/2021 Stage prefix: Initial diagnosis    01/25/2021 Procedure   Successful placement of a right IJ approach Power Port with ultrasound and fluoroscopic guidance. The catheter is ready for use.   02/07/2021 - 05/19/2021 Chemotherapy   Patient is on Treatment Plan : UTERINE Carboplatin AUC 6 / Paclitaxel q21d     02/11/2021 Genetic Testing   Negative hereditary cancer genetic testing: no pathogenic variants detected in [facility] Common Hereditary Cancers +RNA Panel.  The report date is February 11, 2021.    The Common Hereditary Cancers + RNA Panel offered by [facility] includes sequencing, deletion/duplication,  and RNA testing of the following 47 genes: APC, ATM, AXIN2, BARD1, BMPR1A, BRCA1, BRCA2, BRIP1, CDH1, CDK4*, CDKN2A (p14ARF)*, CDKN2A (p16INK4a)*, CHEK2, CTNNA1, DICER1, EPCAM (Deletion/duplication testing only), GREM1 (promoter region deletion/duplication testing only), KIT, MEN1, MLH1, MSH2, MSH3, MSH6, MUTYH, NBN, NF1, NHTL1, PALB2, PDGFRA*, PMS2, POLD1, POLE, PTEN, RAD50, RAD51C, RAD51D, SDHB, SDHC, SDHD, SMAD4, SMARCA4. STK11, TP53, TSC1, TSC2, and VHL.  The following genes were evaluated for sequence changes only: SDHA and HOXB13 c.251G>A variant only.  RNA analysis is not performed for the * genes.     06/14/2021 Imaging   1. Interval hysterectomy and bilateral salpingo oophorectomy. No findings of residual/recurrent malignancy. 2. Substantial lumbar spondylosis and degenerative disc disease. In addition, a right eccentric intervertebral spur at T8-9 causes prominent central narrowing of the thecal sac. 3. Other imaging findings of potential clinical significance: Aortic Atherosclerosis (ICD10-I70.0). Systemic atherosclerosis. Small type 1 hiatal hernia.       Interval History: Saw Dr. GAlvy Bimlerlast week.  Posttreatment imaging showed complete response to chemotherapy.  Patient Stacie Russell is a normal appetite.  Having regular bowel function.  Since surgery, she has had some difficulty with urine incontinence.  [facility] just started pelvic floor physical therapy within the last couple of weeks.  Since surgery, she has to wear a pad due to her incontinence, notes occasional pink tinge on the pad.  Within the last month, she had an episode of seeing a streak on her pad that looked somewhat purulent and had bright red blood.  She denies any further bleeding or purulent drainage.  She denies any pelvic pain.  She has some vulvar pruritus although less than she had before surgery.  Past Medical/Surgical History: Past Medical History:  Diagnosis Date   Arthritis     Asthma    Diverticulosis    Dyspnea    with exertion    Endometrial hyperplasia    Esophageal stricture    Family history of breast cancer 01/26/2021   Foot pain    Fundic gland polyps of stomach, benign 07/10/2015   Gastritis    GERD (gastroesophageal reflux disease)    Hemorrhoid    Hiatal hernia    Hip pain    History of kidney stones    cystoscopy  History of surgery on arm    left humerus   Hyperlipidemia    Hypertension    Hypothyroidism    Knee pain    Metabolic syndrome    Obstructive sleep apnea 08/27/2017   pt denies    Oxygen deficiency    pt denies   PONV (postoperative nausea and vomiting)    Spinal headache    few headaches recently    Tendonitis of ankle    Vitamin D deficiency     Past Surgical History:  Procedure Laterality Date   ANAL FISSURE REPAIR     CATARACT EXTRACTION  May 2009   COLONOSCOPY  12/26/07   ESOPHAGEAL DILATION     ESOPHAGOGASTRODUODENOSCOPY  12/26/07, 05/13/08   EYE SURGERY Bilateral    bilateral cataract with lens implants   IR IMAGING GUIDED PORT INSERTION  01/25/2021   KNEE CARTILAGE SURGERY Right Sept 1999   ORIF HUMERUS FRACTURE Left 01/12/2020   Procedure: OPEN REDUCTION INTERNAL FIXATION (ORIF) LEFT HUMERUS FRACTURE;  Surgeon: Roseanne Kaufman, MD;  Location: [facility];  Service: Orthopedics;  Laterality: Left;   ROBOTIC ASSISTED TOTAL HYSTERECTOMY WITH BILATERAL SALPINGO OOPHERECTOMY Bilateral 12/29/2020   Procedure: XI ROBOTIC ASSISTED TOTAL HYSTERECTOMY WITH BILATERAL SALPINGO OOPHORECTOMY;  Surgeon: Everitt Amber, MD;  Location: [facility];  Service: Gynecology;  Laterality: Bilateral;   SENTINEL NODE BIOPSY N/A 12/29/2020   Procedure: SENTINEL NODE BIOPSY;  Surgeon: Everitt Amber, MD;  Location: [facility];  Service: Gynecology;  Laterality: N/A;   TOTAL KNEE ARTHROPLASTY Right 10/07/2017   Procedure: RIGHT TOTAL KNEE ARTHROPLASTY;  Surgeon: Gaynelle Arabian, MD;  Location: [facility];  Service: Orthopedics;  Laterality: Right;  Adductor Block    UMBILICAL HERNIA REPAIR  05/20/06    Family History  Problem Relation Age of Onset   Colon cancer Mother 21   Heart attack Father    Heart disease Sister    Breast cancer Sister 86   Breast cancer Sister        dx 44s   Cancer Maternal Aunt 62       "female cancer", unknown type   Cancer Maternal Grandmother        unknown type; dx 39s   Cancer Maternal Grandfather        unknown type; dx 33s   Cancer Cousin        maternal female cousin; dx 48s; unknown type   Cancer Cousin        maternal female cousin; dx 61s; unknown type   Cancer Cousin        maternal female cousin; dx after 8; unknown type   Esophageal cancer Neg Hx    Stomach cancer Neg Hx    Rectal cancer Neg Hx     Social History   Socioeconomic History   Marital status: Married    Spouse name: Marcello Moores   Number of children: 2   Years of education: 14   Highest education level: Associate degree: academic program  Occupational History   Occupation: Retired  Tobacco Use   Smoking status: Never   Smokeless tobacco: Never  Scientific laboratory technician Use: Never used  Substance and Sexual Activity   Alcohol use: No   Drug use: No   Sexual activity: Not Currently    Birth control/protection: Post-menopausal  Other Topics Concern   Not on file  Social History Narrative   Married and retired   No EtOH, tobacco or drug use   Social Determinants of Radio broadcast assistant  Strain: Low Risk    Difficulty of Paying Living Expenses: Not hard at all  Food Insecurity: No Food Insecurity   Worried About Charity fundraiser in the Last Year: Never true   Ran Out of Food in the Last Year: Never true  Transportation Needs: No Transportation Needs   Lack of Transportation (Medical): No   Lack of Transportation (Non-Medical): No  Physical Activity: Inactive   Days of Exercise per Week: 0 days   Minutes of Exercise per Session: 0 min  Stress: Stress Concern Present   Feeling of Stress : To some extent  Social  Connections: Engineer, building services of Communication with Friends and Family: More than three times a week   Frequency of Social Gatherings with Friends and Family: More than three times a week   Attends Religious Services: More than 4 times per year   Active Member of [employer] or Organizations: Yes   Attends Archivist Meetings: 1 to 4 times per year   Marital Status: Married    Current Medications:  Current Outpatient Medications:    acetaminophen (TYLENOL) 500 MG tablet, Take 1,000 mg by mouth as needed (pain.)., Disp: , Rfl:    albuterol (VENTOLIN HFA) 108 (90 Base) MCG/ACT inhaler, Inhale 2 puffs into the lungs every 6 (six) hours as needed for wheezing or shortness of breath. (Patient taking differently: Inhale 2 puffs into the lungs as needed for wheezing or shortness of breath. PRN), Disp: 6.7 g, Rfl: 1   budesonide-formoterol (SYMBICORT) 80-4.5 MCG/ACT inhaler, Inhale 2 puffs into the lungs 2 (two) times daily. (Patient taking differently: Inhale 2 puffs into the lungs as needed (respiratory issues.).), Disp: 4 each, Rfl: 11   Calcium Carbonate-Vit D-Min (CALCIUM 1200 PO), Take 1 tablet by mouth in the morning., Disp: , Rfl:    Cholecalciferol (VITAMIN D3) 2000 units TABS, Take 2,000 Units by mouth in the morning., Disp: , Rfl:    docusate sodium (COLACE) 100 MG capsule, Take 100 mg by mouth 2 (two) times daily as needed (constipation.)., Disp: , Rfl:    DULoxetine (CYMBALTA) 30 MG capsule, TAKE 1 CAPSULE BY MOUTH  DAILY, Disp: 90 capsule, Rfl: 0   esomeprazole (NEXIUM) 40 MG capsule, Take 1 capsule (40 mg total) by mouth daily before breakfast., Disp: 90 capsule, Rfl: 3   estradiol (ESTRACE VAGINAL) 0.1 MG/GM vaginal cream, Place 1 Applicatorful vaginally 3 (three) times a week. Use a finger tip amount within the vagina, Disp: 42.5 g, Rfl: 12   furosemide (LASIX) 20 MG tablet, Take 1 tablet (20 mg total) by mouth daily as needed for fluid. (Patient taking differently:  Take 20 mg by mouth as needed for fluid. PRN), Disp: 90 tablet, Rfl: 1   levothyroxine (SYNTHROID) 50 MCG tablet, TAKE 1 TABLET BY MOUTH  DAILY BEFORE BREAKFAST (Patient taking differently: Take 50 mcg by mouth daily before breakfast.), Disp: 90 tablet, Rfl: 3   lidocaine-prilocaine (EMLA) cream, Apply to affected area once, Disp: 30 g, Rfl: 3   losartan-hydrochlorothiazide (HYZAAR) 100-12.5 MG tablet, TAKE 1 TABLET BY MOUTH  DAILY, Disp: 90 tablet, Rfl: 0   nystatin (MYCOSTATIN/NYSTOP) powder, Apply 1 application topically 3 (three) times daily. For use under pannus, in groins, or other areas on your skin with itching, Disp: 15 g, Rfl: 1   Omega-3 Fatty Acids (FISH OIL) 1000 MG CAPS, Take 1,000 mg by mouth in the morning., Disp: , Rfl:    oxybutynin (DITROPAN) 5 MG tablet, TAKE 2 TABLETS BY  MOUTH  TWICE DAILY, Disp: 360 tablet, Rfl: 1   vitamin B-12 (CYANOCOBALAMIN) 1000 MCG tablet, Take 1,000 mcg by mouth in the morning., Disp: , Rfl:    lactulose (CHRONULAC) 10 GM/15ML solution, Take 15 mLs (10 g total) by mouth 3 (three) times daily. (Patient not taking: Reported on 08/03/2021), Disp: 473 mL, Rfl: 0   Plecanatide (TRULANCE) 3 MG TABS, Take 1 tablet by mouth daily. (Patient not taking: Reported on 08/03/2021), Disp: 8 tablet, Rfl: 0   valACYclovir (VALTREX) 1000 MG tablet, Take 1 tablet twice daily for 7 days.  If you have another flare, take 2 tablets twice a day for ONE day only per flareup. (Patient not taking: Reported on 08/03/2021), Disp: 30 tablet, Rfl: 0  Review of Systems: Pertinent positives include urinary frequency, vaginal bleeding, itching. Denies appetite changes, fevers, chills, fatigue, unexplained weight changes. Denies hearing loss, neck lumps or masses, mouth sores, ringing in ears or voice changes. Denies cough or wheezing.  Denies shortness of breath. Denies chest pain or palpitations. Denies leg swelling. Denies abdominal distention, pain, blood in stools, constipation,  diarrhea, nausea, vomiting, or early satiety. Denies pain with intercourse, dysuria, hematuria or incontinence. Denies hot flashes, pelvic pain or vaginal discharge.   Denies joint pain, back pain or muscle pain/cramps. Denies rash, or wounds. Denies dizziness, headaches, numbness or seizures. Denies swollen lymph nodes or glands, denies easy bruising or bleeding. Denies anxiety, depression, confusion, or decreased concentration.  Physical Exam: BP (!) 174/74 (BP Location: Left Arm, Patient Position: Sitting)    Pulse 79    Temp 97.6 F (36.4 C) (Tympanic)    Resp 16    Ht 5' 1"  (1.549 m)    Wt 213 lb 12.8 oz (97 kg)    SpO2 100%    BMI 40.40 kg/m  General: Alert, oriented, no acute distress. HEENT: Normocephalic, atraumatic, sclera anicteric. Chest: Clear to auscultation bilaterally.  No wheezes or rhonchi. Cardiovascular: Regular rate and rhythm, no murmurs. Abdomen: Obese, soft, nontender.  Normoactive bowel sounds.  No masses or hepatosplenomegaly appreciated.  Well-healed incisions. Extremities: Grossly normal range of motion.  Warm, well perfused.  No edema bilaterally. Skin: No rashes or lesions noted. Lymphatics: No cervical, supraclavicular, or inguinal adenopathy. GU: Normal appearing external genitalia without erythema, excoriation, or lesions.  No loss of architecture.  Speculum exam reveals moderately atrophic vaginal mucosa, no lesions or masses, no bleeding or discharge noted.  Laxity of the vaginal wall noted.  Bimanual exam reveals cuff and vaginal mucosa smooth, no nodularity.  Rectovaginal exam confirms these findings.  Laboratory & Radiologic Studies: CBC    Component Value Date/Time   WBC 4.9 07/31/2021 1046   WBC 7.9 12/26/2020 1341   RBC 3.51 (L) 07/31/2021 1046   HGB 11.5 (L) 07/31/2021 1046   HGB 12.9 03/01/2020 1055   HCT 34.7 (L) 07/31/2021 1046   HCT 38.2 03/01/2020 1055   PLT 195 07/31/2021 1046   PLT 250 03/01/2020 1055   MCV 98.9 07/31/2021 1046    MCV 89 03/01/2020 1055   MCH 32.8 07/31/2021 1046   MCHC 33.1 07/31/2021 1046   RDW 12.8 07/31/2021 1046   RDW 12.3 03/01/2020 1055   LYMPHSABS 1.3 07/31/2021 1046   LYMPHSABS 2.0 03/01/2020 1055   MONOABS 0.4 07/31/2021 1046   EOSABS 0.2 07/31/2021 1046   EOSABS 0.2 03/01/2020 1055   BASOSABS 0.0 07/31/2021 1046   BASOSABS 0.0 03/01/2020 1055   CMP Latest Ref Rng & Units 07/31/2021 06/13/2021 05/19/2021  Glucose  70 - 99 mg/dL 91 93 161(H)  BUN 8 - 23 mg/dL 13 10 14   Creatinine 0.44 - 1.00 mg/dL 0.70 0.67 0.75  Sodium 135 - 145 mmol/L 142 140 140  Potassium 3.5 - 5.1 mmol/L 3.7 3.8 4.1  Chloride 98 - 111 mmol/L 107 104 105  CO2 22 - 32 mmol/L 27 28 26   Calcium 8.9 - 10.3 mg/dL 9.0 9.2 10.1  Total Protein 6.5 - 8.1 g/dL 6.4(L) 6.6 7.5  Total Bilirubin 0.3 - 1.2 mg/dL 0.5 0.3 0.4  Alkaline Phos 38 - 126 U/L 87 100 109  AST 15 - 41 U/L 14(L) 14(L) 15  ALT 0 - 44 U/L 15 15 18    Assessment & Plan: Stacie Russell is a 70 y.o. 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  Completed adjuvant chemotherapy in October.  Posttreatment imaging shows no evidence of disease.  Patient is overall doing well and is NED on my exam today.  I do not see an obvious explanation for her minimal vaginal spotting other than atrophy.  I discussed a trial of vaginal estrogen to see if this helps.  She was interested in a prescription was sent to her pharmacy.    In terms of her vulvar itching, I do not see any skin changes that would be suggestive of lichen sclerosis or other dermatoses.  I recommended that we try some nystatin.  The symptoms predated her surgery and cancer diagnosis.  She continues to have issues with urinary frequency and incontinence.  [facility] and has recently started pelvic floor physical therapy.  I will plan to see her back in 3 months for surveillance visit as well as to assess her symptoms.   32 minutes  of total time was spent for this patient encounter, including preparation, face-to-face counseling with the patient and coordination of care, and documentation of the encounter.  Jeral Pinch, MD  Division of Gynecologic Oncology  Department of Obstetrics and Gynecology  [facility]

## 2021-08-10 ENCOUNTER — Telehealth: Payer: Self-pay

## 2021-08-10 DIAGNOSIS — M6281 Muscle weakness (generalized): Secondary | ICD-10-CM | POA: Diagnosis not present

## 2021-08-10 NOTE — Telephone Encounter (Signed)
Per Joylene John, NP medical clarification request successfully faxed to [facility] [phone]).  Estrace vaginal cream - use fingertip amount within the vagina three time a week.  Nystatin powder - apply three times daily under pannus, in groin, on vulva for itching.

## 2021-08-16 DIAGNOSIS — M6281 Muscle weakness (generalized): Secondary | ICD-10-CM | POA: Diagnosis not present

## 2021-08-24 DIAGNOSIS — M6281 Muscle weakness (generalized): Secondary | ICD-10-CM | POA: Diagnosis not present

## 2021-08-31 DIAGNOSIS — M6281 Muscle weakness (generalized): Secondary | ICD-10-CM | POA: Diagnosis not present

## 2021-09-07 ENCOUNTER — Other Ambulatory Visit: Payer: Self-pay | Admitting: Family Medicine

## 2021-09-12 IMAGING — DX DG HUMERUS 2V *L*
3 series · 3 of 3 positions shown · non-contrast
Comparison: Chest radiograph 01/14/2017.

CLINICAL DATA: 68-year-old female status post fall at home with
pain.

EXAM:
LEFT HUMERUS - 2+ VIEW

[humerus ap]
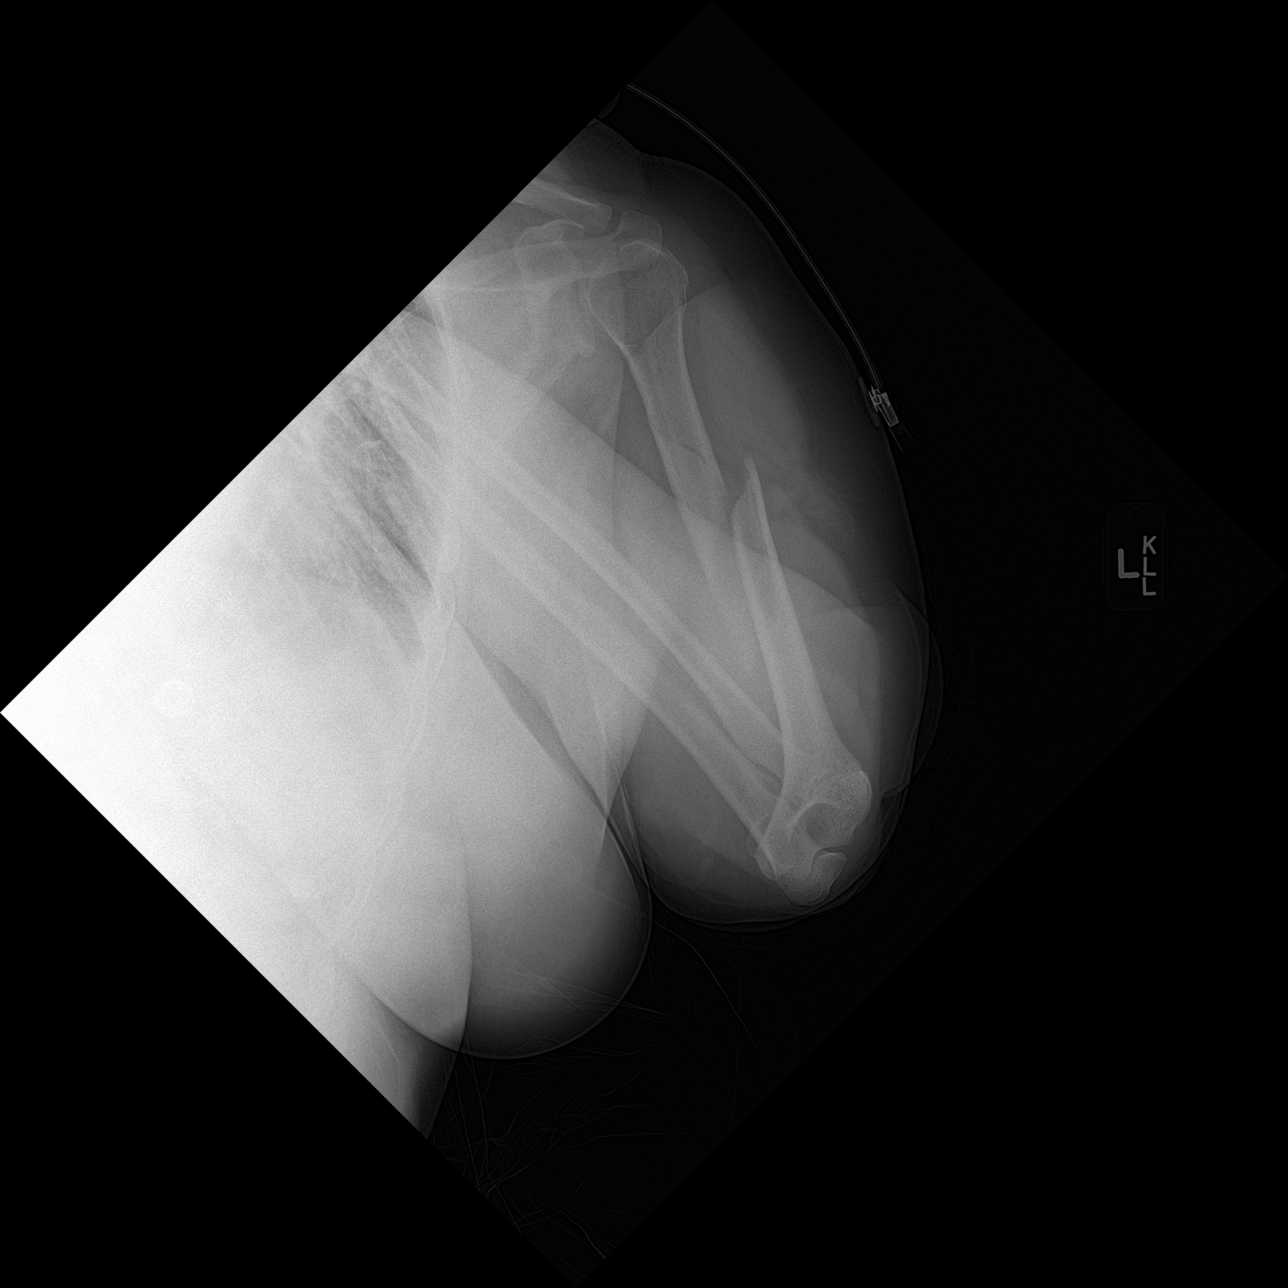

[humerus lat (1 of 2)]
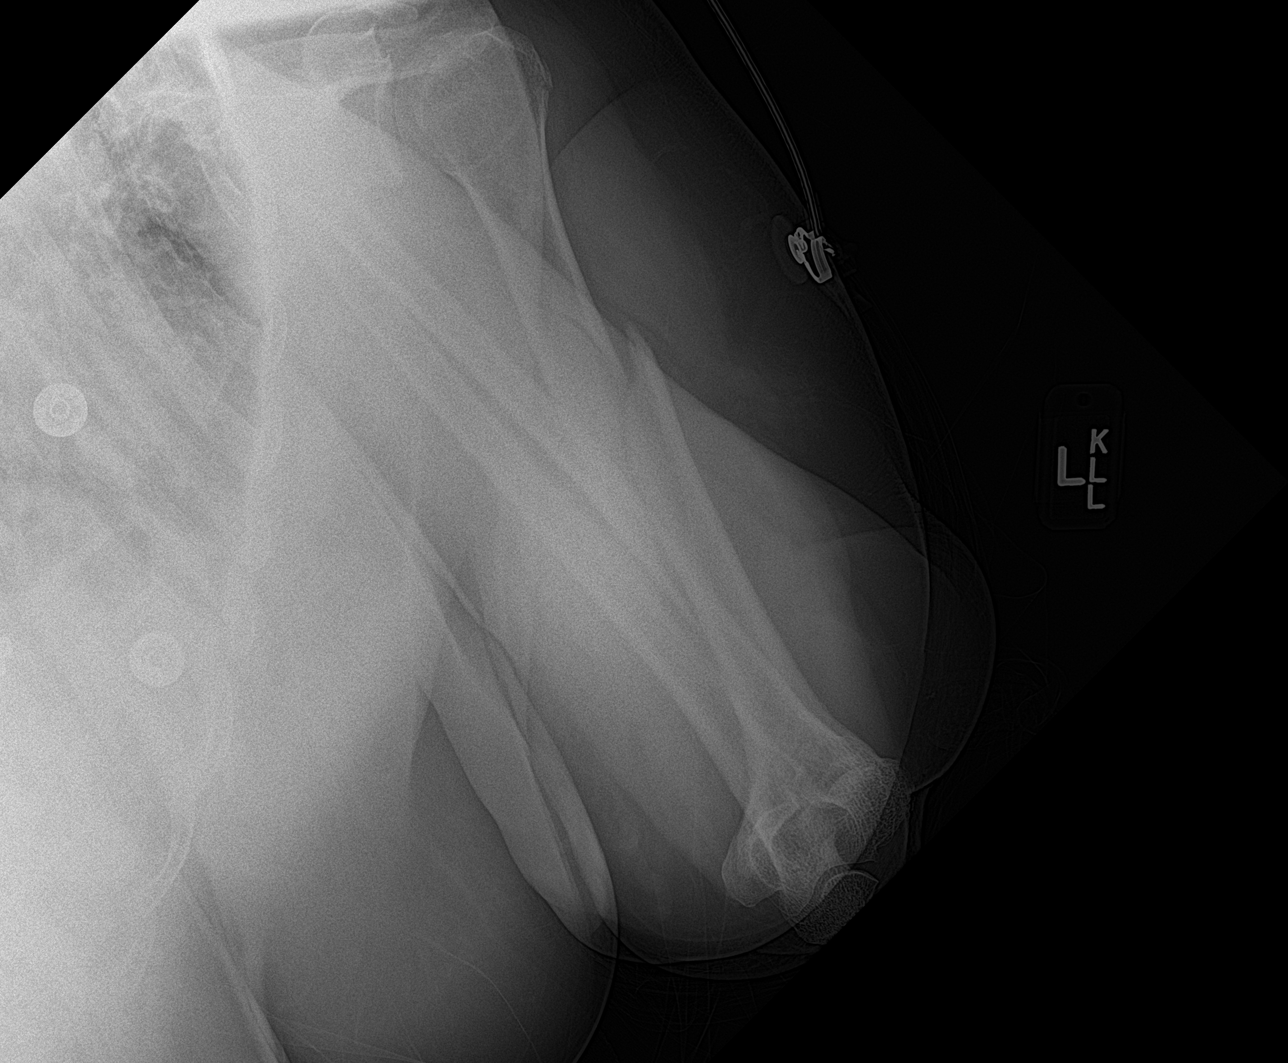

[humerus lat (2 of 2)]
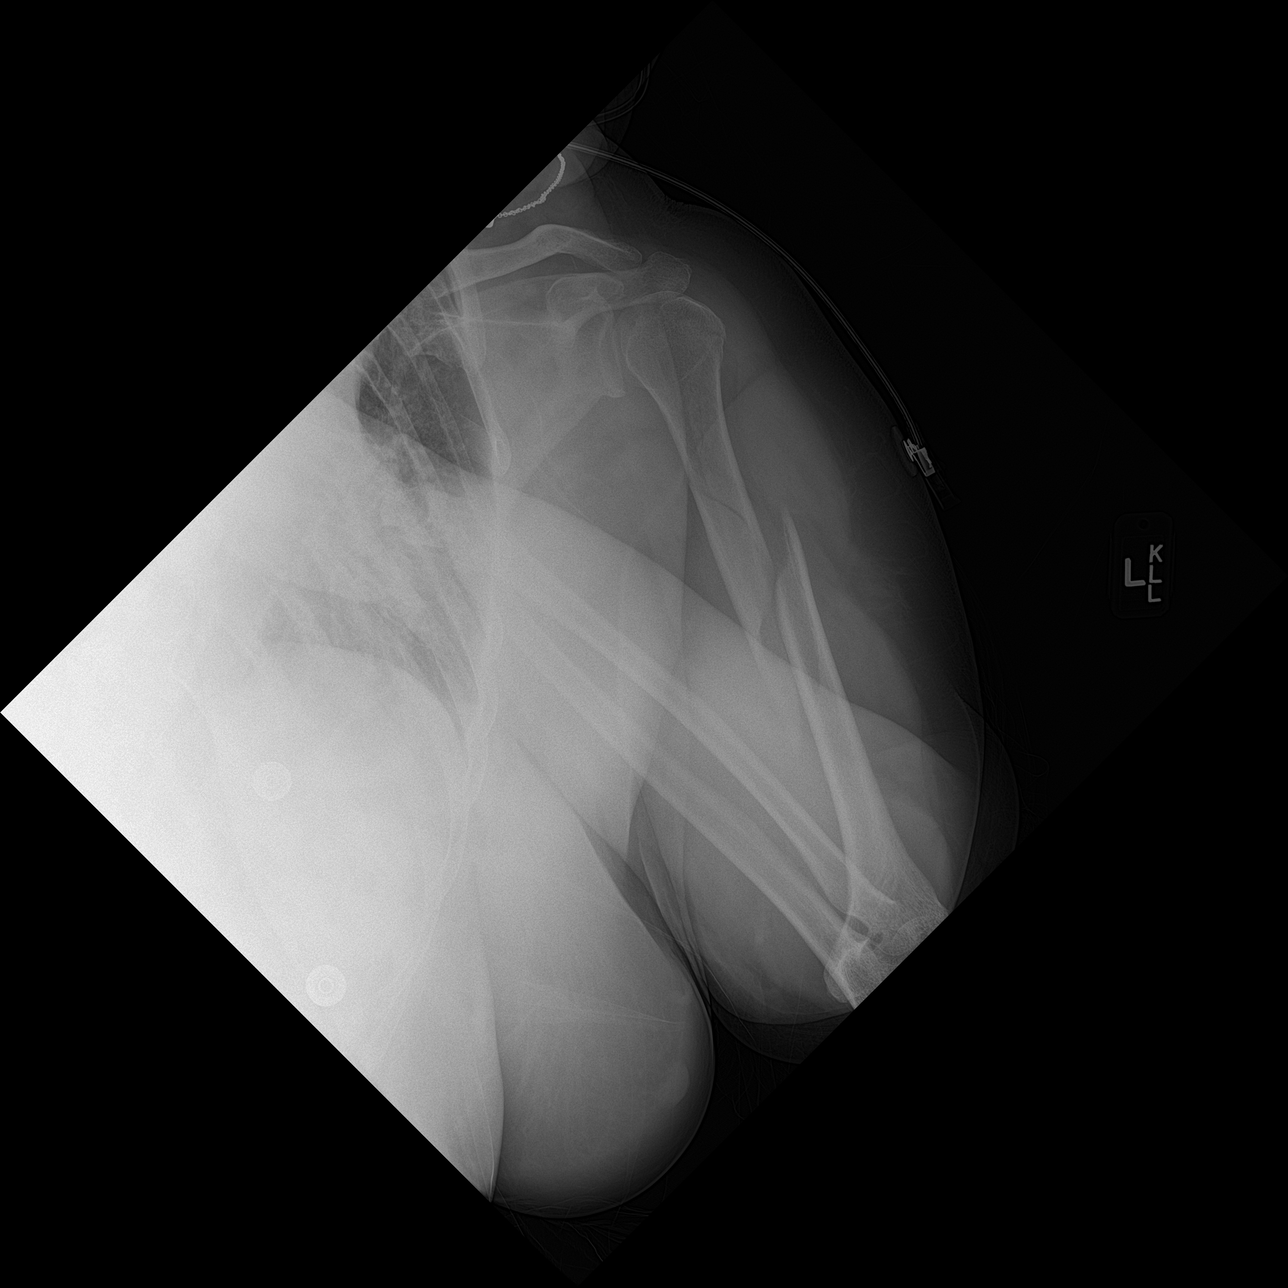

[3 of 3 positions shown; findings below may reference images not displayed]

FINDINGS: Comminuted spiral fracture of the midshaft left humerus. 1 full
shaft width lateral displacement of the fracture fragment with
estimated 1-2 cm of over riding.

The proximal and distal humerus metadiaphysis appear to remain
intact. Glenohumeral joint alignment appears grossly normal. No
other No acute osseous abnormality identified.
IMPRESSION: Elongated spiral fracture of the left humerus midshaft with
comminution, 1 full shaft width lateral displacement, and over
riding of fragments by 1-2 cm.

## 2021-09-13 ENCOUNTER — Other Ambulatory Visit: Payer: Self-pay | Admitting: Family Medicine

## 2021-10-02 ENCOUNTER — Inpatient Hospital Stay: Payer: Medicare Other | Attending: Gynecologic Oncology

## 2021-10-02 ENCOUNTER — Other Ambulatory Visit: Payer: Self-pay

## 2021-10-02 ENCOUNTER — Inpatient Hospital Stay: Payer: Medicare Other | Admitting: Hematology and Oncology

## 2021-10-02 ENCOUNTER — Encounter: Payer: Self-pay | Admitting: Hematology and Oncology

## 2021-10-02 DIAGNOSIS — C541 Malignant neoplasm of endometrium: Secondary | ICD-10-CM | POA: Insufficient documentation

## 2021-10-02 DIAGNOSIS — K5909 Other constipation: Secondary | ICD-10-CM

## 2021-10-02 DIAGNOSIS — K760 Fatty (change of) liver, not elsewhere classified: Secondary | ICD-10-CM | POA: Insufficient documentation

## 2021-10-02 DIAGNOSIS — Z885 Allergy status to narcotic agent status: Secondary | ICD-10-CM | POA: Insufficient documentation

## 2021-10-02 DIAGNOSIS — I2510 Atherosclerotic heart disease of native coronary artery without angina pectoris: Secondary | ICD-10-CM | POA: Insufficient documentation

## 2021-10-02 DIAGNOSIS — I700 Atherosclerosis of aorta: Secondary | ICD-10-CM | POA: Insufficient documentation

## 2021-10-02 DIAGNOSIS — Z888 Allergy status to other drugs, medicaments and biological substances status: Secondary | ICD-10-CM | POA: Diagnosis not present

## 2021-10-02 DIAGNOSIS — Z79899 Other long term (current) drug therapy: Secondary | ICD-10-CM | POA: Insufficient documentation

## 2021-10-02 LAB — CBC WITH DIFFERENTIAL (CANCER CENTER ONLY)
Abs Immature Granulocytes: 0.02 10*3/uL (ref 0.00–0.07)
Basophils Absolute: 0 10*3/uL (ref 0.0–0.1)
Basophils Relative: 0 %
Eosinophils Absolute: 0.2 10*3/uL (ref 0.0–0.5)
Eosinophils Relative: 2 %
HCT: 39.4 % (ref 36.0–46.0)
Hemoglobin: 13.1 g/dL (ref 12.0–15.0)
Immature Granulocytes: 0 %
Lymphocytes Relative: 23 %
Lymphs Abs: 1.6 10*3/uL (ref 0.7–4.0)
MCH: 31.5 pg (ref 26.0–34.0)
MCHC: 33.2 g/dL (ref 30.0–36.0)
MCV: 94.7 fL (ref 80.0–100.0)
Monocytes Absolute: 0.5 10*3/uL (ref 0.1–1.0)
Monocytes Relative: 7 %
Neutro Abs: 4.6 10*3/uL (ref 1.7–7.7)
Neutrophils Relative %: 68 %
Platelet Count: 217 10*3/uL (ref 150–400)
RBC: 4.16 MIL/uL (ref 3.87–5.11)
RDW: 12.6 % (ref 11.5–15.5)
WBC Count: 7 10*3/uL (ref 4.0–10.5)
nRBC: 0 % (ref 0.0–0.2)

## 2021-10-02 LAB — CMP (CANCER CENTER ONLY)
ALT: 13 U/L (ref 0–44)
AST: 12 U/L — ABNORMAL LOW (ref 15–41)
Albumin: 4.1 g/dL (ref 3.5–5.0)
Alkaline Phosphatase: 104 U/L (ref 38–126)
Anion gap: 5 (ref 5–15)
BUN: 22 mg/dL (ref 8–23)
CO2: 31 mmol/L (ref 22–32)
Calcium: 9.4 mg/dL (ref 8.9–10.3)
Chloride: 103 mmol/L (ref 98–111)
Creatinine: 0.72 mg/dL (ref 0.44–1.00)
GFR, Estimated: >60 mL/min (ref >60)
Glucose, Bld: 91 mg/dL (ref 70–99)
Potassium: 3.9 mmol/L (ref 3.5–5.1)
Sodium: 139 mmol/L (ref 135–145)
Total Bilirubin: 0.6 mg/dL (ref 0.3–1.2)
Total Protein: 7 g/dL (ref 6.5–8.1)

## 2021-10-02 MED ORDER — SODIUM CHLORIDE 0.9% FLUSH
10.0000 mL | Freq: Once | INTRAVENOUS | Status: AC
  Administered 2021-10-02: 10 mL

## 2021-10-02 NOTE — Progress Notes (Signed)
[place] OFFICE PROGRESS NOTE  Patient Care Team: Janora Norlander, DO as PCP - General (Family Medicine) Lorretta Harp, MD as PCP - Cardiology (Cardiology) Irine Seal, MD (Urology) Paula Compton, MD (Obstetrics and Gynecology) Gatha Mayer, MD (Gastroenterology) Gaynelle Arabian, MD as Consulting Physician (Orthopedic Surgery) Heath Lark, MD as Consulting Physician (Hematology and Oncology)  ASSESSMENT & PLAN:  Endometrial cancer [facility]) Her last imaging study showed complete response to chemotherapy The patient is at high risk of cancer recurrence due to the stage of her disease We discussed the importance of dietary modification and weight loss She will continue close monitoring and follow-up with GYN surgeon I will see her again in a few months for further follow-up  Other constipation She has chronic constipation, only go to the bathroom for bowel movement approximately twice a week We discussed importance of laxative therapy  Obesity, Class III, BMI 40-49.9 (morbid obesity) ([place]) We discussed the link between obesity and risk of cancer I motivated the patient to modify her diet and increase physical activity as tolerated  No orders of the defined types were placed in this encounter.   All questions were answered. The patient knows to call the clinic with any problems, questions or concerns. The total time spent in the appointment was 20 minutes encounter with patients including review of chart and various tests results, discussions about plan of care and coordination of care plan   Heath Lark, MD 10/02/2021 1:58 PM  INTERVAL HISTORY: Please see below for problem oriented charting. she returns for treatment follow-up She is doing well She attempted to watch what she eats but has not been successful with weight loss over the last few months She has chronic constipation Denies recent nausea or bloating  REVIEW OF SYSTEMS:   Constitutional:  Denies fevers, chills or abnormal weight loss Eyes: Denies blurriness of vision Ears, nose, mouth, throat, and face: Denies mucositis or sore throat Respiratory: Denies cough, dyspnea or wheezes Cardiovascular: Denies palpitation, chest discomfort or lower extremity swelling Gastrointestinal:  Denies nausea, heartburn or change in bowel habits Skin: Denies abnormal skin rashes Lymphatics: Denies new lymphadenopathy or easy bruising Neurological:Denies numbness, tingling or new weaknesses Behavioral/Psych: Mood is stable, no new changes  All other systems were reviewed with the patient and are negative.  I have reviewed the past medical history, past surgical history, social history and family history with the patient and they are unchanged from previous note.  ALLERGIES:  is allergic to tramadol.  MEDICATIONS:  Current Outpatient Medications  Medication Sig Dispense Refill   acetaminophen (TYLENOL) 500 MG tablet Take 1,000 mg by mouth as needed (pain.).     albuterol (VENTOLIN HFA) 108 (90 Base) MCG/ACT inhaler Inhale 2 puffs into the lungs every 6 (six) hours as needed for wheezing or shortness of breath. (Patient taking differently: Inhale 2 puffs into the lungs as needed for wheezing or shortness of breath. PRN) 6.7 g 1   budesonide-formoterol (SYMBICORT) 80-4.5 MCG/ACT inhaler Inhale 2 puffs into the lungs 2 (two) times daily. (Patient taking differently: Inhale 2 puffs into the lungs as needed (respiratory issues.).) 4 each 11   Calcium Carbonate-Vit D-Min (CALCIUM 1200 PO) Take 1 tablet by mouth in the morning.     Cholecalciferol (VITAMIN D3) 2000 units TABS Take 2,000 Units by mouth in the morning.     DULoxetine (CYMBALTA) 30 MG capsule TAKE 1 CAPSULE BY MOUTH DAILY 90 capsule 0   esomeprazole (NEXIUM) 40 MG capsule Take 1  capsule (40 mg total) by mouth daily before breakfast. 90 capsule 3   estradiol (ESTRACE VAGINAL) 0.1 MG/GM vaginal cream Place 1 Applicatorful vaginally 3  (three) times a week. Use a finger tip amount within the vagina 42.5 g 12   furosemide (LASIX) 20 MG tablet Take 1 tablet (20 mg total) by mouth daily as needed for fluid. (Patient taking differently: Take 20 mg by mouth as needed for fluid. PRN) 90 tablet 1   levothyroxine (SYNTHROID) 50 MCG tablet TAKE 1 TABLET BY MOUTH  DAILY BEFORE BREAKFAST (Patient taking differently: Take 50 mcg by mouth daily before breakfast.) 90 tablet 3   lidocaine-prilocaine (EMLA) cream Apply to affected area once 30 g 3   losartan-hydrochlorothiazide (HYZAAR) 100-12.5 MG tablet TAKE 1 TABLET BY MOUTH DAILY 90 tablet 1   Omega-3 Fatty Acids (FISH OIL) 1000 MG CAPS Take 1,000 mg by mouth in the morning.     oxybutynin (DITROPAN) 5 MG tablet TAKE 2 TABLETS BY MOUTH  TWICE DAILY 360 tablet 1   valACYclovir (VALTREX) 1000 MG tablet Take 1 tablet twice daily for 7 days.  If you have another flare, take 2 tablets twice a day for ONE day only per flareup. (Patient not taking: Reported on 08/03/2021) 30 tablet 0   vitamin B-12 (CYANOCOBALAMIN) 1000 MCG tablet Take 1,000 mcg by mouth in the morning.     No current facility-administered medications for this visit.    SUMMARY OF ONCOLOGIC HISTORY: Oncology History Overview Note  MSI stable, endometrioid grade 2 Neg genetics   Endometrial cancer ([place])  12/19/2020 Initial Diagnosis   Endometrial cancer ([place])   12/26/2020 Imaging   1. Lower uterine segment/cervical mass without evidence of metastatic disease. 2. Hepatic steatosis. 3. Aortic atherosclerosis (ICD10-I70.0). Coronary artery calcification.   12/29/2020 Pathology Results   FINAL MICROSCOPIC DIAGNOSIS:   A. SENTINEL LYMPH NODE, RIGHT OBTURATOR, EXCISION:  -  No carcinoma identified in one lymph node (0/1)  -  See comment   B. SENTINEL LYMPH NODE, LEFT OBTURATOR, EXCISION:  -  Micrometastasis involving one lymph node (84m/1)  -  See comment   C. UTERUS, CERVIX, BILATERAL TUBES AND OVARIES:  Uterus:  -   Endometrioid carcinoma, FIGO grade 2  -  See oncology table and comment below   Cervix:  -  No carcinoma identified   Bilateral Ovaries:  -  No carcinoma identified   Bilateral Fallopian tubes:  -  No carcinoma identified   ONCOLOGY TABLE:   UTERUS, CARCINOMA OR CARCINOSARCOMA: Resection   Procedure: Total hysterectomy and bilateral salpingo-oophorectomy  Histologic Type: Endometrioid carcinoma  Histologic Grade: FIGO grade 2  Myometrial Invasion:       Percentage of Myometrial Invasion: Estimated to be less than 50%  Uterine Serosa Involvement: Not identified  Cervical stromal Involvement: Not identified  Extent of involvement of other tissue/organs: Not identified  Peritoneal/Ascitic Fluid: Not submitted/unknown  Lymphovascular Invasion: Not identified  Regional Lymph Nodes:       Pelvic Lymph Nodes Examined:  2 Sentinel                                0 non-sentinel                                   2 total       Pelvic Lymph Nodes with Metastasis: 0  Macrometastasis: (>2.0 mm): 0                          Micrometastasis: (>0.2 mm and < 2.0 mm): 1                            Isolated Tumor Cells (<0.2 mm): 0                       Laterality of Lymph Node with Tumor: 0                             Extracapsular Extension: N/A  Distant Metastasis:       Distant Site(s) Involved: N/A  Pathologic Stage Classification (pTNM, AJCC 8th Edition): pT1a, pN30m  Ancillary Studies: MMR / MSI testing will be ordered  Representative Tumor Block: C1  Comment(s): Cytokeratin AE1/3 was performed on the lymph nodes (parts a and B) and supports the presence of micrometastasis (part B only). Drs. PSaralyn Pilarand CVance [facility] LLCreviewed select parts of the case and agree with the above diagnosis.   (v4.2.0.1)     12/29/2020 Surgery   Surgeon: RDonaciano EvaPre-operative Diagnosis: endometrial cancer grade 3    Operation: Robotic-assisted laparoscopic total  hysterectomy with bilateral salpingoophorectomy, SLN biopsy   Surgeon: RDonaciano Eva Specimens: uterus, cervix, bilateral tubes and ovaries, right and left obturator SLN           01/22/2021 Cancer Staging   Staging form: Corpus Uteri - Carcinoma and Carcinosarcoma, AJCC 8th Edition - Pathologic stage from 01/22/2021: Stage IIIC1 (pT1a, pN135m cM0) - Signed by GoHeath LarkMD on 01/22/2021 Stage prefix: Initial diagnosis    01/25/2021 Procedure   Successful placement of a right IJ approach Power Port with ultrasound and fluoroscopic guidance. The catheter is ready for use.   02/07/2021 - 05/19/2021 Chemotherapy   Patient is on Treatment Plan : UTERINE Carboplatin AUC 6 / Paclitaxel q21d     02/11/2021 Genetic Testing   Negative hereditary cancer genetic testing: no pathogenic variants detected in [facility] Common Hereditary Cancers +RNA Panel.  The report date is February 11, 2021.    The Common Hereditary Cancers + RNA Panel offered by [facility] includes sequencing, deletion/duplication, and RNA testing of the following 47 genes: APC, ATM, AXIN2, BARD1, BMPR1A, BRCA1, BRCA2, BRIP1, CDH1, CDK4*, CDKN2A (p14ARF)*, CDKN2A (p16INK4a)*, CHEK2, CTNNA1, DICER1, EPCAM (Deletion/duplication testing only), GREM1 (promoter region deletion/duplication testing only), KIT, MEN1, MLH1, MSH2, MSH3, MSH6, MUTYH, NBN, NF1, NHTL1, PALB2, PDGFRA*, PMS2, POLD1, POLE, PTEN, RAD50, RAD51C, RAD51D, SDHB, SDHC, SDHD, SMAD4, SMARCA4. STK11, TP53, TSC1, TSC2, and VHL.  The following genes were evaluated for sequence changes only: SDHA and HOXB13 c.251G>A variant only.  RNA analysis is not performed for the * genes.     06/14/2021 Imaging   1. Interval hysterectomy and bilateral salpingo oophorectomy. No findings of residual/recurrent malignancy. 2. Substantial lumbar spondylosis and degenerative disc disease. In addition, a right eccentric intervertebral spur at T8-9 causes prominent central narrowing of the thecal sac. 3.  Other imaging findings of potential clinical significance: Aortic Atherosclerosis (ICD10-I70.0). Systemic atherosclerosis. Small type 1 hiatal hernia.       PHYSICAL EXAMINATION: ECOG PERFORMANCE STATUS: 1 - Symptomatic but completely ambulatory  Vitals:   10/02/21 1042  BP: 139/68  Pulse: 78  Resp: 18  Temp: 97.6 F (36.4 C)  SpO2: 100%   Filed Weights   10/02/21 1042  Weight: 214 lb 9.6 oz (97.3 kg)    GENERAL:alert, no distress and comfortable SKIN: skin color, texture, turgor are normal, no rashes or significant lesions EYES: normal, Conjunctiva are pink and non-injected, sclera clear OROPHARYNX:no exudate, no erythema and lips, buccal mucosa, and tongue normal  NECK: supple, thyroid normal size, non-tender, without nodularity LYMPH:  no palpable lymphadenopathy in the cervical, axillary or inguinal LUNGS: clear to auscultation and percussion with normal breathing effort HEART: regular rate & rhythm and no murmurs and no lower extremity edema ABDOMEN:abdomen soft, non-tender and normal bowel sounds Musculoskeletal:no cyanosis of digits and no clubbing  NEURO: alert & oriented x 3 with fluent speech, no focal motor/sensory deficits  LABORATORY DATA:  I have reviewed the data as listed    Component Value Date/Time   NA 139 10/02/2021 1016   NA 141 08/31/2020 1149   K 3.9 10/02/2021 1016   CL 103 10/02/2021 1016   CO2 31 10/02/2021 1016   GLUCOSE 91 10/02/2021 1016   BUN 22 10/02/2021 1016   BUN 15 08/31/2020 1149   CREATININE 0.72 10/02/2021 1016   CREATININE 0.60 11/03/2012 1228   CALCIUM 9.4 10/02/2021 1016   PROT 7.0 10/02/2021 1016   PROT 7.3 08/31/2020 1149   ALBUMIN 4.1 10/02/2021 1016   ALBUMIN 4.7 08/31/2020 1149   AST 12 (L) 10/02/2021 1016   ALT 13 10/02/2021 1016   ALKPHOS 104 10/02/2021 1016   BILITOT 0.6 10/02/2021 1016   GFRNONAA >60 10/02/2021 1016   GFRAA 91 08/31/2020 1149    No results found for: SPEP, UPEP  Lab Results  Component  Value Date   WBC 7.0 10/02/2021   NEUTROABS 4.6 10/02/2021   HGB 13.1 10/02/2021   HCT 39.4 10/02/2021   MCV 94.7 10/02/2021   PLT 217 10/02/2021      Chemistry      Component Value Date/Time   NA 139 10/02/2021 1016   NA 141 08/31/2020 1149   K 3.9 10/02/2021 1016   CL 103 10/02/2021 1016   CO2 31 10/02/2021 1016   BUN 22 10/02/2021 1016   BUN 15 08/31/2020 1149   CREATININE 0.72 10/02/2021 1016   CREATININE 0.60 11/03/2012 1228   GLU 81 07/24/2012 0000      Component Value Date/Time   CALCIUM 9.4 10/02/2021 1016   ALKPHOS 104 10/02/2021 1016   AST 12 (L) 10/02/2021 1016   ALT 13 10/02/2021 1016   BILITOT 0.6 10/02/2021 1016

## 2021-10-02 NOTE — Assessment & Plan Note (Signed)
Her last imaging study showed complete response to chemotherapy The patient is at high risk of cancer recurrence due to the stage of her disease We discussed the importance of dietary modification and weight loss She will continue close monitoring and follow-up with GYN surgeon I will see her again in a few months for further follow-up

## 2021-10-02 NOTE — Assessment & Plan Note (Signed)
She has chronic constipation, only go to the bathroom for bowel movement approximately twice a week We discussed importance of laxative therapy

## 2021-10-02 NOTE — Assessment & Plan Note (Signed)
We discussed the link between obesity and risk of cancer I motivated the patient to modify her diet and increase physical activity as tolerated

## 2021-11-13 ENCOUNTER — Other Ambulatory Visit: Payer: Self-pay

## 2021-11-13 ENCOUNTER — Encounter: Payer: Self-pay | Admitting: Gynecologic Oncology

## 2021-11-13 ENCOUNTER — Inpatient Hospital Stay: Payer: Medicare Other

## 2021-11-13 ENCOUNTER — Inpatient Hospital Stay: Payer: Medicare Other | Attending: Gynecologic Oncology | Admitting: Gynecologic Oncology

## 2021-11-13 VITALS — BP 131/73 | HR 66 | Temp 98.4°F | Resp 16 | Ht 61.0 in | Wt 213.6 lb

## 2021-11-13 DIAGNOSIS — Z90710 Acquired absence of both cervix and uterus: Secondary | ICD-10-CM | POA: Insufficient documentation

## 2021-11-13 DIAGNOSIS — R350 Frequency of micturition: Secondary | ICD-10-CM | POA: Insufficient documentation

## 2021-11-13 DIAGNOSIS — Z9221 Personal history of antineoplastic chemotherapy: Secondary | ICD-10-CM | POA: Insufficient documentation

## 2021-11-13 DIAGNOSIS — R3915 Urgency of urination: Secondary | ICD-10-CM | POA: Diagnosis not present

## 2021-11-13 DIAGNOSIS — K5900 Constipation, unspecified: Secondary | ICD-10-CM | POA: Insufficient documentation

## 2021-11-13 DIAGNOSIS — Z90722 Acquired absence of ovaries, bilateral: Secondary | ICD-10-CM | POA: Insufficient documentation

## 2021-11-13 DIAGNOSIS — Z8542 Personal history of malignant neoplasm of other parts of uterus: Secondary | ICD-10-CM | POA: Diagnosis present

## 2021-11-13 DIAGNOSIS — B3731 Acute candidiasis of vulva and vagina: Secondary | ICD-10-CM | POA: Diagnosis not present

## 2021-11-13 DIAGNOSIS — B370 Candidal stomatitis: Secondary | ICD-10-CM | POA: Insufficient documentation

## 2021-11-13 DIAGNOSIS — C541 Malignant neoplasm of endometrium: Secondary | ICD-10-CM

## 2021-11-13 DIAGNOSIS — B379 Candidiasis, unspecified: Secondary | ICD-10-CM

## 2021-11-13 LAB — CBC WITH DIFFERENTIAL (CANCER CENTER ONLY)
Abs Immature Granulocytes: 0.02 10*3/uL (ref 0.00–0.07)
Basophils Absolute: 0 10*3/uL (ref 0.0–0.1)
Basophils Relative: 0 %
Eosinophils Absolute: 0.2 10*3/uL (ref 0.0–0.5)
Eosinophils Relative: 3 %
HCT: 37.8 % (ref 36.0–46.0)
Hemoglobin: 12.3 g/dL (ref 12.0–15.0)
Immature Granulocytes: 0 %
Lymphocytes Relative: 33 %
Lymphs Abs: 2 10*3/uL (ref 0.7–4.0)
MCH: 30.9 pg (ref 26.0–34.0)
MCHC: 32.5 g/dL (ref 30.0–36.0)
MCV: 95 fL (ref 80.0–100.0)
Monocytes Absolute: 0.5 10*3/uL (ref 0.1–1.0)
Monocytes Relative: 9 %
Neutro Abs: 3.3 10*3/uL (ref 1.7–7.7)
Neutrophils Relative %: 55 %
Platelet Count: 206 10*3/uL (ref 150–400)
RBC: 3.98 MIL/uL (ref 3.87–5.11)
RDW: 13.7 % (ref 11.5–15.5)
WBC Count: 6 10*3/uL (ref 4.0–10.5)
nRBC: 0 % (ref 0.0–0.2)

## 2021-11-13 LAB — CMP (CANCER CENTER ONLY)
ALT: 12 U/L (ref 0–44)
AST: 11 U/L — ABNORMAL LOW (ref 15–41)
Albumin: 3.9 g/dL (ref 3.5–5.0)
Alkaline Phosphatase: 89 U/L (ref 38–126)
Anion gap: 5 (ref 5–15)
BUN: 16 mg/dL (ref 8–23)
CO2: 29 mmol/L (ref 22–32)
Calcium: 9.5 mg/dL (ref 8.9–10.3)
Chloride: 106 mmol/L (ref 98–111)
Creatinine: 0.57 mg/dL (ref 0.44–1.00)
GFR, Estimated: >60 mL/min (ref >60)
Glucose, Bld: 105 mg/dL — ABNORMAL HIGH (ref 70–99)
Potassium: 3.8 mmol/L (ref 3.5–5.1)
Sodium: 140 mmol/L (ref 135–145)
Total Bilirubin: 0.4 mg/dL (ref 0.3–1.2)
Total Protein: 6.8 g/dL (ref 6.5–8.1)

## 2021-11-13 MED ORDER — MAGIC MOUTHWASH W/LIDOCAINE: 5.0000 mL | Freq: Three times a day (TID) | ORAL | 0 refills | Status: DC

## 2021-11-13 MED ORDER — SODIUM CHLORIDE 0.9% FLUSH
10.0000 mL | Freq: Once | INTRAVENOUS | Status: AC
  Administered 2021-11-13: 10 mL

## 2021-11-13 MED ORDER — FLUCONAZOLE 150 MG PO TABS: 150.0000 mg | ORAL_TABLET | Freq: Every day | ORAL | 1 refills | Status: DC

## 2021-11-13 MED ORDER — HEPARIN SOD (PORK) LOCK FLUSH 100 UNIT/ML IV SOLN
500.0000 [IU] | Freq: Once | INTRAVENOUS | Status: AC
  Administered 2021-11-13: 500 [IU]

## 2021-11-13 MED ORDER — NYSTATIN 100000 UNIT/GM EX POWD: 1.0000 "application " | Freq: Three times a day (TID) | CUTANEOUS | 1 refills | Status: DC

## 2021-11-13 NOTE — Progress Notes (Signed)
Gynecologic Oncology Return Clinic Visit ? ?08/04/2021 ? ?Reason for Visit: surveillance in the setting of high risk uterine cancer ? ?Treatment History: ?Oncology History Overview Note  ?MSI stable, endometrioid grade 2 ?Neg genetics ?  ?Endometrial cancer ([place])  ?12/19/2020 Initial Diagnosis  ? Endometrial cancer [facility]) ?  ?12/26/2020 Imaging  ? 1. Lower uterine segment/cervical mass without evidence of metastatic disease. ?2. Hepatic steatosis. ?3. Aortic atherosclerosis (ICD10-I70.0). Coronary artery calcification. ?  ?12/29/2020 Pathology Results  ? FINAL MICROSCOPIC DIAGNOSIS:  ? ?A. SENTINEL LYMPH NODE, RIGHT OBTURATOR, EXCISION:  ?-  No carcinoma identified in one lymph node (0/1)  ?-  See comment  ? ?B. SENTINEL LYMPH NODE, LEFT OBTURATOR, EXCISION:  ?-  Micrometastasis involving one lymph node (59m/1)  ?-  See comment  ? ?C. UTERUS, CERVIX, BILATERAL TUBES AND OVARIES:  ?Uterus:  ?-  Endometrioid carcinoma, FIGO grade 2  ?-  See oncology table and comment below  ? ?Cervix:  ?-  No carcinoma identified  ? ?Bilateral Ovaries:  ?-  No carcinoma identified  ? ?Bilateral Fallopian tubes:  ?-  No carcinoma identified  ? ?ONCOLOGY TABLE:  ? ?UTERUS, CARCINOMA OR CARCINOSARCOMA: Resection  ? ?Procedure: Total hysterectomy and bilateral salpingo-oophorectomy  ?Histologic Type: Endometrioid carcinoma  ?Histologic Grade: FIGO grade 2  ?Myometrial Invasion:  ?     Percentage of Myometrial Invasion: Estimated to be less than 50%  ?Uterine Serosa Involvement: Not identified  ?Cervical stromal Involvement: Not identified  ?Extent of involvement of other tissue/organs: Not identified  ?Peritoneal/Ascitic Fluid: Not submitted/unknown  ?Lymphovascular Invasion: Not identified  ?Regional Lymph Nodes:  ?     Pelvic Lymph Nodes Examined:  ?2 Sentinel  ?                              0 non-sentinel  ?                                 2 total  ?     Pelvic Lymph Nodes with Metastasis: 0  ?                         Macrometastasis: (>2.0  mm): 0  ?                        Micrometastasis: (>0.2 mm and < 2.0 mm): 1  ?                          Isolated Tumor Cells (<0.2 mm): 0  ?                     Laterality of Lymph Node with Tumor: 0  ?                           Extracapsular Extension: N/A  ?Distant Metastasis:  ?     Distant Site(s) Involved: N/A  ?Pathologic Stage Classification (pTNM, AJCC 8th Edition): pT1a, pN185m ?Ancillary Studies: MMR / MSI testing will be ordered  ?Representative Tumor Block: C1  ?Comment(s): Cytokeratin AE1/3 was performed on the lymph nodes (parts a and B) and supports the presence of micrometastasis (part B only). Drs. PaSaralyn Pilarnd CaFirst Street Hospitaleviewed select parts of the case and agree with  the above diagnosis.  ? ?(v4.2.0.1)  ? ?  ?12/29/2020 Surgery  ? Surgeon: Donaciano Eva ?Pre-operative Diagnosis: endometrial cancer grade 3 ?   ?Operation: Robotic-assisted laparoscopic total hysterectomy with bilateral salpingoophorectomy, SLN biopsy ?  ?Surgeon: Donaciano Eva ? Specimens: uterus, cervix, bilateral tubes and ovaries, right and left obturator SLN ?        ?  ?01/22/2021 Cancer Staging  ? Staging form: Corpus Uteri - Carcinoma and Carcinosarcoma, AJCC 8th Edition ?- Pathologic stage from 01/22/2021: Stage IIIC1 (pT1a, pN70m, cM0) - Signed by GHeath Lark MD on 01/22/2021 ?Stage prefix: Initial diagnosis ? ?  ?01/25/2021 Procedure  ? Successful placement of a right IJ approach Power Port with ultrasound and fluoroscopic guidance. The catheter is ready for use. ?  ?02/07/2021 - 05/19/2021 Chemotherapy  ? Patient is on Treatment Plan : UTERINE Carboplatin AUC 6 / Paclitaxel q21d  ?   ?02/11/2021 Genetic Testing  ? Negative hereditary cancer genetic testing: no pathogenic variants detected in [facility] Common Hereditary Cancers +RNA Panel.  The report date is February 11, 2021.   ? ?The Common Hereditary Cancers + RNA Panel offered by [facility] includes sequencing, deletion/duplication, and RNA testing of the following 47 genes:  APC, ATM, AXIN2, BARD1, BMPR1A, BRCA1, BRCA2, BRIP1, CDH1, CDK4*, CDKN2A (p14ARF)*, CDKN2A (p16INK4a)*, CHEK2, CTNNA1, DICER1, EPCAM (Deletion/duplication testing only), GREM1 (promoter region deletion/duplication testing only), KIT, MEN1, MLH1, MSH2, MSH3, MSH6, MUTYH, NBN, NF1, NHTL1, PALB2, PDGFRA*, PMS2, POLD1, POLE, PTEN, RAD50, RAD51C, RAD51D, SDHB, SDHC, SDHD, SMAD4, SMARCA4. STK11, TP53, TSC1, TSC2, and VHL.  The following genes were evaluated for sequence changes only: SDHA and HOXB13 c.251G>A variant only.  RNA analysis is not performed for the * genes.   ?  ?06/14/2021 Imaging  ? 1. Interval hysterectomy and bilateral salpingo oophorectomy. No findings of residual/recurrent malignancy. ?2. Substantial lumbar spondylosis and degenerative disc disease. In addition, a right eccentric intervertebral spur at T8-9 causes prominent central narrowing of the thecal sac. ?3. Other imaging findings of potential clinical significance: Aortic Atherosclerosis (ICD10-I70.0). Systemic atherosclerosis. Small type 1 hiatal hernia. ?  ?  ? ? ?Interval History: ?She denies any vaginal bleeding or discharge.  Continues to have some vulvar irritation.  Saw her dentist and given symptoms concerning for oral thrush (she has had this before and has similar symptoms now), was given a one-time dose for yeast.  This helped with her oral symptoms as well as her vulvar symptoms, but they have not completely resolved. ?Constipation at baseline. ? ?Has been using vaginal estrogen, denies any change to her symptoms. ? ?Past Medical/Surgical History: ?Past Medical History:  ?Diagnosis Date  ? Arthritis   ? Asthma   ? Diverticulosis   ? Dyspnea   ? with exertion   ? Endometrial hyperplasia   ? Esophageal stricture   ? Family history of breast cancer 01/26/2021  ? Foot pain   ? Fundic gland polyps of stomach, benign 07/10/2015  ? Gastritis   ? GERD (gastroesophageal reflux disease)   ? Hemorrhoid   ? Hiatal hernia   ? Hip pain   ? History of  kidney stones   ? cystoscopy  ? History of surgery on arm   ? left humerus  ? Hyperlipidemia   ? Hypertension   ? Hypothyroidism   ? Knee pain   ? Metabolic syndrome   ? Obstructive sleep apnea 08/27/2017  ? pt denies   ? Oxygen deficiency   ? pt denies  ? PONV (postoperative nausea and vomiting)   ?  Spinal headache   ? few headaches recently   ? Tendonitis of ankle   ? Vitamin D deficiency   ? ? ?Past Surgical History:  ?Procedure Laterality Date  ? ANAL FISSURE REPAIR    ? CATARACT EXTRACTION  May 2009  ? COLONOSCOPY  12/26/07  ? ESOPHAGEAL DILATION    ? ESOPHAGOGASTRODUODENOSCOPY  12/26/07, 05/13/08  ? EYE SURGERY Bilateral   ? bilateral cataract with lens implants  ? IR IMAGING GUIDED PORT INSERTION  01/25/2021  ? KNEE CARTILAGE SURGERY Right Sept 1999  ? ORIF HUMERUS FRACTURE Left 01/12/2020  ? Procedure: OPEN REDUCTION INTERNAL FIXATION (ORIF) LEFT HUMERUS FRACTURE;  Surgeon: Roseanne Kaufman, MD;  Location: [place];  Service: Orthopedics;  Laterality: Left;  ? ROBOTIC ASSISTED TOTAL HYSTERECTOMY WITH BILATERAL SALPINGO OOPHERECTOMY Bilateral 12/29/2020  ? Procedure: XI ROBOTIC ASSISTED TOTAL HYSTERECTOMY WITH BILATERAL SALPINGO OOPHORECTOMY;  Surgeon: Everitt Amber, MD;  Location: [facility];  Service: Gynecology;  Laterality: Bilateral;  ? SENTINEL NODE BIOPSY N/A 12/29/2020  ? Procedure: SENTINEL NODE BIOPSY;  Surgeon: Everitt Amber, MD;  Location: [facility];  Service: Gynecology;  Laterality: N/A;  ? TOTAL KNEE ARTHROPLASTY Right 10/07/2017  ? Procedure: RIGHT TOTAL KNEE ARTHROPLASTY;  Surgeon: Gaynelle Arabian, MD;  Location: [facility];  Service: Orthopedics;  Laterality: Right;  Adductor Block  ? UMBILICAL HERNIA REPAIR  05/20/06  ? ? ?Family History  ?Problem Relation Age of Onset  ? Colon cancer Mother 49  ? Heart attack Father   ? Heart disease Sister   ? Breast cancer Sister 24  ? Breast cancer Sister   ?     dx 21s  ? Cancer Maternal Aunt 53  ?     "female cancer", unknown type  ? Cancer Maternal Grandmother   ?     unknown  type; dx 9s  ? Cancer Maternal Grandfather   ?     unknown type; dx 43s  ? Cancer Cousin   ?     maternal female cousin; dx 38s; unknown type  ? Cancer Cousin   ?     maternal female cousin; dx 69s;

## 2021-11-13 NOTE — Patient Instructions (Signed)
It was good to see you today.  I have sent in a prescription for yeast powder as well as a one-time pill for yeast infection.  Melissa is also sending in some mouthwash for you to swish and spit for the yeast infection in your mouth.  Please let me know in the next few weeks if your symptoms do not get significantly better or go away completely. ? ?I will plan to see you 1 more time in 3 months.  After that point, we will start to alternate less frequently between Dr. Alvy Bimler and myself. ?

## 2021-12-01 ENCOUNTER — Other Ambulatory Visit: Payer: Self-pay | Admitting: Family Medicine

## 2021-12-11 ENCOUNTER — Other Ambulatory Visit: Payer: Self-pay | Admitting: Family Medicine

## 2021-12-14 ENCOUNTER — Telehealth: Payer: Self-pay | Admitting: Pharmacist

## 2021-12-14 DIAGNOSIS — J45909 Unspecified asthma, uncomplicated: Secondary | ICD-10-CM

## 2021-12-14 MED ORDER — BUDESONIDE-FORMOTEROL FUMARATE 80-4.5 MCG/ACT IN AERO: 2.0000 | INHALATION_SPRAY | Freq: Two times a day (BID) | RESPIRATORY_TRACT | 11 refills | Status: AC

## 2021-12-14 NOTE — Telephone Encounter (Signed)
[facility]MIC[facility] REFILLS FOR AZ&ME PATIENT ASSISTANCE SENT TO [facility] ? ?

## 2021-12-26 ENCOUNTER — Other Ambulatory Visit: Payer: Self-pay

## 2021-12-26 ENCOUNTER — Other Ambulatory Visit: Payer: Self-pay | Admitting: Family Medicine

## 2021-12-26 ENCOUNTER — Inpatient Hospital Stay: Payer: Medicare Other | Attending: Gynecologic Oncology

## 2021-12-26 DIAGNOSIS — Z452 Encounter for adjustment and management of vascular access device: Secondary | ICD-10-CM | POA: Insufficient documentation

## 2021-12-26 DIAGNOSIS — N3281 Overactive bladder: Secondary | ICD-10-CM

## 2021-12-26 DIAGNOSIS — Z8542 Personal history of malignant neoplasm of other parts of uterus: Secondary | ICD-10-CM | POA: Insufficient documentation

## 2021-12-26 DIAGNOSIS — C541 Malignant neoplasm of endometrium: Secondary | ICD-10-CM

## 2021-12-26 MED ORDER — HEPARIN SOD (PORK) LOCK FLUSH 100 UNIT/ML IV SOLN
500.0000 [IU] | Freq: Once | INTRAVENOUS | Status: AC
  Administered 2021-12-26: 500 [IU]

## 2021-12-26 MED ORDER — SODIUM CHLORIDE 0.9% FLUSH
10.0000 mL | Freq: Once | INTRAVENOUS | Status: AC
  Administered 2021-12-26: 10 mL

## 2022-01-19 ENCOUNTER — Ambulatory Visit (INDEPENDENT_AMBULATORY_CARE_PROVIDER_SITE_OTHER): Payer: Medicare Other | Admitting: Family Medicine

## 2022-01-19 ENCOUNTER — Encounter: Payer: Self-pay | Admitting: Family Medicine

## 2022-01-19 VITALS — BP 134/79 | HR 64 | Temp 98.0°F | Ht 61.0 in | Wt 218.4 lb

## 2022-01-19 DIAGNOSIS — I10 Essential (primary) hypertension: Secondary | ICD-10-CM

## 2022-01-19 DIAGNOSIS — M1712 Unilateral primary osteoarthritis, left knee: Secondary | ICD-10-CM | POA: Diagnosis not present

## 2022-01-19 DIAGNOSIS — N3281 Overactive bladder: Secondary | ICD-10-CM

## 2022-01-19 DIAGNOSIS — B009 Herpesviral infection, unspecified: Secondary | ICD-10-CM

## 2022-01-19 DIAGNOSIS — G8929 Other chronic pain: Secondary | ICD-10-CM

## 2022-01-19 DIAGNOSIS — M25562 Pain in left knee: Secondary | ICD-10-CM

## 2022-01-19 DIAGNOSIS — E039 Hypothyroidism, unspecified: Secondary | ICD-10-CM

## 2022-01-19 MED ORDER — DULOXETINE HCL 30 MG PO CPEP: 30.0000 mg | ORAL_CAPSULE | Freq: Every day | ORAL | 3 refills | Status: DC

## 2022-01-19 MED ORDER — VALACYCLOVIR HCL 1 G PO TABS: ORAL_TABLET | ORAL | 0 refills | Status: DC

## 2022-01-19 MED ORDER — FUROSEMIDE 20 MG PO TABS: 20.0000 mg | ORAL_TABLET | Freq: Every day | ORAL | 1 refills | Status: DC | PRN

## 2022-01-19 MED ORDER — LEVOTHYROXINE SODIUM 50 MCG PO TABS: 50.0000 ug | ORAL_TABLET | Freq: Every day | ORAL | 3 refills | Status: DC

## 2022-01-19 MED ORDER — LOSARTAN POTASSIUM-HCTZ 100-12.5 MG PO TABS: 1.0000 | ORAL_TABLET | Freq: Every day | ORAL | 3 refills | Status: DC

## 2022-01-19 MED ORDER — METHYLPREDNISOLONE ACETATE 40 MG/ML IJ SUSP
40.0000 mg | Freq: Once | INTRAMUSCULAR | Status: AC
  Administered 2022-01-19: 40 mg via INTRAMUSCULAR

## 2022-01-19 NOTE — Progress Notes (Signed)
Subjective: CC: Hypothyroidism PCP: Janora Norlander, DO Stacie Russell is a 71 y.o. female presenting to clinic today for:  1.  Hypothyroidism She is compliant with Synthroid 50 mcg daily.  She will get her labs drawn with her next oncologic visit.  Denies any tremor, heart palpitations  2.  GERD Patient wants to go back on pantoprazole for GERD control.  She continues to have breakthrough even with that but felt that that was better than the Nexium she is on.  No reports of GI bleeding, nausea or vomiting  3.  Left knee pain Patient has an appoint with orthopedist on August 3 for left knee pain.  She has had chronic issues with bilateral knees and has been trying to avoid surgery for the left knee.  She is required a total knee replacement on the right.  Her last corticosteroid injection was back before the pandemic started.  She has never had a knee injection on the left side.  Not on any anticoagulation.  No history of allergy or intolerance to corticosteroid injections.  4.  Overactive bladder Patient is interested in potentially starting Detrol.  The Myrbetriq was too expensive and the oxybutynin really does not do much for her.  She is going to check with her insurance to see what the cost would be before she asks me to prescribe it but she will get back in touch me at her earliest availability   ROS: Per HPI  Allergies  Allergen Reactions   Tramadol Other (See Comments)    Unsure of reaction   Past Medical History:  Diagnosis Date   Arthritis    Asthma    Diverticulosis    Dyspnea    with exertion    Endometrial hyperplasia    Esophageal stricture    Family history of breast cancer 01/26/2021   Foot pain    Fundic gland polyps of stomach, benign 07/10/2015   Gastritis    GERD (gastroesophageal reflux disease)    Hemorrhoid    Hiatal hernia    Hip pain    History of kidney stones    cystoscopy   History of surgery on arm    left humerus    Hyperlipidemia    Hypertension    Hypothyroidism    Knee pain    Metabolic syndrome    Obstructive sleep apnea 08/27/2017   pt denies    Oxygen deficiency    pt denies   PONV (postoperative nausea and vomiting)    Spinal headache    few headaches recently    Tendonitis of ankle    Vitamin D deficiency     Current Outpatient Medications:    acetaminophen (TYLENOL) 500 MG tablet, Take 1,000 mg by mouth as needed (pain.)., Disp: , Rfl:    albuterol (VENTOLIN HFA) 108 (90 Base) MCG/ACT inhaler, Inhale 2 puffs into the lungs every 6 (six) hours as needed for wheezing or shortness of breath. (Patient taking differently: Inhale 2 puffs into the lungs as needed for wheezing or shortness of breath. PRN), Disp: 6.7 g, Rfl: 1   budesonide-formoterol (SYMBICORT) 80-4.5 MCG/ACT inhaler, Inhale 2 puffs into the lungs 2 (two) times daily., Disp: 3 each, Rfl: 11   Calcium Carbonate-Vit D-Min (CALCIUM 1200 PO), Take 1 tablet by mouth in the morning., Disp: , Rfl:    Cholecalciferol (VITAMIN D3) 2000 units TABS, Take 2,000 Units by mouth in the morning., Disp: , Rfl:    DULoxetine (CYMBALTA) 30 MG capsule, TAKE 1 CAPSULE  BY MOUTH DAILY, Disp: 90 capsule, Rfl: 0   esomeprazole (NEXIUM) 40 MG capsule, Take 1 capsule (40 mg total) by mouth daily before breakfast., Disp: 90 capsule, Rfl: 3   estradiol (ESTRACE VAGINAL) 0.1 MG/GM vaginal cream, Place 1 Applicatorful vaginally 3 (three) times a week. Use a finger tip amount within the vagina, Disp: 42.5 g, Rfl: 12   furosemide (LASIX) 20 MG tablet, Take 1 tablet (20 mg total) by mouth daily as needed for fluid. (Patient taking differently: Take 20 mg by mouth as needed for fluid. PRN), Disp: 90 tablet, Rfl: 1   levothyroxine (SYNTHROID) 50 MCG tablet, TAKE 1 TABLET BY MOUTH  DAILY BEFORE BREAKFAST (Patient taking differently: Take 50 mcg by mouth daily before breakfast.), Disp: 90 tablet, Rfl: 3   lidocaine-prilocaine (EMLA) cream, Apply to affected area once,  Disp: 30 g, Rfl: 3   losartan-hydrochlorothiazide (HYZAAR) 100-12.5 MG tablet, TAKE 1 TABLET BY MOUTH DAILY, Disp: 100 tablet, Rfl: 0   nystatin (MYCOSTATIN/NYSTOP) powder, Apply 1 application. topically 3 (three) times daily., Disp: 15 g, Rfl: 1   Omega-3 Fatty Acids (FISH OIL) 1000 MG CAPS, Take 1,000 mg by mouth in the morning., Disp: , Rfl:    oxybutynin (DITROPAN) 5 MG tablet, TAKE 2 TABLETS BY MOUTH TWICE  DAILY, Disp: 360 tablet, Rfl: 0   valACYclovir (VALTREX) 1000 MG tablet, Take 1 tablet twice daily for 7 days.  If you have another flare, take 2 tablets twice a day for ONE day only per flareup., Disp: 30 tablet, Rfl: 0   vitamin B-12 (CYANOCOBALAMIN) 1000 MCG tablet, Take 1,000 mcg by mouth in the morning., Disp: , Rfl:  Social History   Socioeconomic History   Marital status: Married    Spouse name: Marcello Moores   Number of children: 2   Years of education: 14   Highest education level: Associate degree: academic program  Occupational History   Occupation: Retired  Tobacco Use   Smoking status: Never   Smokeless tobacco: Never  Scientific laboratory technician Use: Never used  Substance and Sexual Activity   Alcohol use: No   Drug use: No   Sexual activity: Not Currently    Birth control/protection: Post-menopausal  Other Topics Concern   Not on file  Social History Narrative   Married and retired   No EtOH, tobacco or drug use   Social Determinants of Health   Financial Resource Strain: Low Risk  (05/12/2021)   Overall Financial Resource Strain (CARDIA)    Difficulty of Paying Living Expenses: Not hard at all  Food Insecurity: No [place] (05/12/2021)   Hunger Vital Sign    Worried About Running Out of Food in the Last Year: Never true    [place] in the Last Year: Never true  Transportation Needs: No Transportation Needs (05/12/2021)   PRAPARE - Hydrologist (Medical): No    Lack of Transportation (Non-Medical): No  Physical Activity:  Inactive (05/12/2021)   Exercise Vital Sign    Days of Exercise per Week: 0 days    Minutes of Exercise per Session: 0 min  Stress: Stress Concern Present (05/12/2021)   [place]    Feeling of Stress : To some extent  Social Connections: Socially Integrated (05/12/2021)   Social Connection and Isolation Panel [NHANES]    Frequency of Communication with Friends and Family: More than three times a week    Frequency of Social  Gatherings with Friends and Family: More than three times a week    Attends Religious Services: More than 4 times per year    Active Member of [employer] or Organizations: Yes    Attends Archivist Meetings: 1 to 4 times per year    Marital Status: Married  Human resources officer Violence: Not At Risk (05/12/2021)   Humiliation, Afraid, Rape, and Kick questionnaire    Fear of Current or Ex-Partner: No    Emotionally Abused: No    Physically Abused: No    Sexually Abused: No   Family History  Problem Relation Age of Onset   Colon cancer Mother 66   Heart attack Father    Heart disease Sister    Breast cancer Sister 57   Breast cancer Sister        dx 32s   Cancer Maternal Aunt 24       "female cancer", unknown type   Cancer Maternal Grandmother        unknown type; dx 67s   Cancer Maternal Grandfather        unknown type; dx 54s   Cancer Cousin        maternal female cousin; dx 42s; unknown type   Cancer Cousin        maternal female cousin; dx 7s; unknown type   Cancer Cousin        maternal female cousin; dx after 58; unknown type   Esophageal cancer Neg Hx    Stomach cancer Neg Hx    Rectal cancer Neg Hx     Objective: Office vital signs reviewed. BP 134/79   Pulse 64   Temp 98 F (36.7 C)   Ht '5\' 1"'$  (1.549 m)   Wt 218 lb 6.4 oz (99.1 kg)   SpO2 95%   BMI 41.27 kg/m   Physical Examination:  General: Awake, alert, morbidly obese, No acute distress HEENT: Sclera white.  No  exophthalmos.  No goiter Cardio: regular rate and rhythm, S1S2 heard, no murmurs appreciated Pulm: clear to auscultation bilaterally, no wheezes, rhonchi or rales; normal work of breathing on room air Extremities: warm, well perfused, No edema, cyanosis or clubbing; +2 pulses bilaterally MSK: Mildly antalgic gait and normal station  Left knee: No tenderness palpation to patella, patellar tendon, quad tendon, posterior popliteal fossa.  Very mild tenderness palpation along the medial joint line.  No soft tissue swelling, no erythema, no warmth.  No ligamentous laxity.  Very narrow joint line palpable along the medial aspect of the knee  JOINT INJECTION:  Patient denies allergy to antiseptics (including iodine) and anesthetics.  Patient denies h/o diabetes, frequent steroid use, use of blood thinners/ antiplatelets.  Patient was given informed consent and a signed copy has been placed in the chart. Appropriate time out was taken. Area prepped and draped in usual sterile fashion. Anatomic landmarks were identified and injection site was marked.  Ethyl chloride spray was used to numb the area and 1 cc of methylprednisolone 40 mg/ml plus  3 cc of 1% lidocaine without epinephrine was injected into the left knee using a(n) anteriolateral approach. The patient tolerated the procedure well and there were no immediate complications. Estimated blood loss is less than 2 cc.  Post procedure instructions were reviewed and handout outlining these instructions were provided to patient.   Assessment/ Plan: 71 y.o. female   Primary osteoarthritis of left knee - Plan: methylPREDNISolone acetate (DEPO-MEDROL) injection 40 mg  Chronic pain of left knee - Plan: methylPREDNISolone  acetate (DEPO-MEDROL) injection 40 mg  Acquired hypothyroidism - Plan: T4, Free, TSH, levothyroxine (SYNTHROID) 50 MCG tablet, CANCELED: TSH, CANCELED: T4, Free  HSV-1 (herpes simplex virus 1) infection - Plan: valACYclovir (VALTREX) 1000  MG tablet  Essential hypertension, benign - Plan: furosemide (LASIX) 20 MG tablet, losartan-hydrochlorothiazide (HYZAAR) 100-12.5 MG tablet  Overactive bladder  Corticosteroid injection administered to the left knee.  No immediate complications.  She tolerated procedure without difficulty.  She understands red flag signs symptoms warranting further evaluation.  We will CC her chart today to Dr. Almyra Deforest.  Wonder if she would benefit from viscosupplementation  She will have thyroid labs drawn with her next laboratory draw with hematology/oncology.  She is asymptomatic from a thyroid standpoint  HSV was not discussed.  Needed refills on everything so Valtrex sent to mail order  Blood pressure well controlled for age.  No changes.  Medications have been renewed  Planning on Detrol pending insurance coverage for her overactive bladder and incontinence  No orders of the defined types were placed in this encounter.  No orders of the defined types were placed in this encounter.    Janora Norlander, DO [place] [phone]

## 2022-01-19 NOTE — Patient Instructions (Signed)

## 2022-01-24 ENCOUNTER — Telehealth: Payer: Self-pay | Admitting: Family Medicine

## 2022-01-24 NOTE — Telephone Encounter (Signed)
Danny calling from [facility] with pt on the line as well--pt wast to call to find out price of detrol but it is too expensive--on the non formulary list. Can an alternative be prescribed? Pt states that Oxybutin '5mg'$  is not working.  Use [facility]. Pt aware that Lajuana Ripple is off today and will address tomorrow.  Please call pt back

## 2022-01-25 NOTE — Telephone Encounter (Signed)
She's told me vesicare and myrbetriq were too expensive as well.  Those are the alternatives.

## 2022-01-29 DIAGNOSIS — H5213 Myopia, bilateral: Secondary | ICD-10-CM | POA: Diagnosis not present

## 2022-01-29 DIAGNOSIS — H524 Presbyopia: Secondary | ICD-10-CM | POA: Diagnosis not present

## 2022-01-29 DIAGNOSIS — Z961 Presence of intraocular lens: Secondary | ICD-10-CM | POA: Diagnosis not present

## 2022-01-29 DIAGNOSIS — H52223 Regular astigmatism, bilateral: Secondary | ICD-10-CM | POA: Diagnosis not present

## 2022-01-29 DIAGNOSIS — H04123 Dry eye syndrome of bilateral lacrimal glands: Secondary | ICD-10-CM | POA: Diagnosis not present

## 2022-01-29 DIAGNOSIS — H1045 Other chronic allergic conjunctivitis: Secondary | ICD-10-CM | POA: Diagnosis not present

## 2022-01-29 DIAGNOSIS — H35033 Hypertensive retinopathy, bilateral: Secondary | ICD-10-CM | POA: Diagnosis not present

## 2022-02-06 ENCOUNTER — Inpatient Hospital Stay: Payer: Medicare Other | Attending: Gynecologic Oncology

## 2022-02-06 ENCOUNTER — Encounter: Payer: Self-pay | Admitting: Hematology and Oncology

## 2022-02-06 ENCOUNTER — Other Ambulatory Visit: Payer: Self-pay

## 2022-02-06 ENCOUNTER — Inpatient Hospital Stay (HOSPITAL_BASED_OUTPATIENT_CLINIC_OR_DEPARTMENT_OTHER): Payer: Medicare Other | Admitting: Hematology and Oncology

## 2022-02-06 DIAGNOSIS — K5909 Other constipation: Secondary | ICD-10-CM | POA: Insufficient documentation

## 2022-02-06 DIAGNOSIS — K760 Fatty (change of) liver, not elsewhere classified: Secondary | ICD-10-CM | POA: Insufficient documentation

## 2022-02-06 DIAGNOSIS — Z888 Allergy status to other drugs, medicaments and biological substances status: Secondary | ICD-10-CM | POA: Insufficient documentation

## 2022-02-06 DIAGNOSIS — Z8542 Personal history of malignant neoplasm of other parts of uterus: Secondary | ICD-10-CM | POA: Diagnosis present

## 2022-02-06 DIAGNOSIS — C541 Malignant neoplasm of endometrium: Secondary | ICD-10-CM | POA: Insufficient documentation

## 2022-02-06 DIAGNOSIS — Z79899 Other long term (current) drug therapy: Secondary | ICD-10-CM | POA: Insufficient documentation

## 2022-02-06 DIAGNOSIS — Z7951 Long term (current) use of inhaled steroids: Secondary | ICD-10-CM | POA: Diagnosis not present

## 2022-02-06 DIAGNOSIS — I700 Atherosclerosis of aorta: Secondary | ICD-10-CM | POA: Insufficient documentation

## 2022-02-06 DIAGNOSIS — Z885 Allergy status to narcotic agent status: Secondary | ICD-10-CM | POA: Diagnosis not present

## 2022-02-06 DIAGNOSIS — I2510 Atherosclerotic heart disease of native coronary artery without angina pectoris: Secondary | ICD-10-CM | POA: Diagnosis not present

## 2022-02-06 DIAGNOSIS — Z79890 Hormone replacement therapy (postmenopausal): Secondary | ICD-10-CM | POA: Diagnosis not present

## 2022-02-06 LAB — CMP (CANCER CENTER ONLY)
ALT: 13 U/L (ref 0–44)
AST: 12 U/L — ABNORMAL LOW (ref 15–41)
Albumin: 3.8 g/dL (ref 3.5–5.0)
Alkaline Phosphatase: 75 U/L (ref 38–126)
Anion gap: 1 — ABNORMAL LOW (ref 5–15)
BUN: 20 mg/dL (ref 8–23)
CO2: 31 mmol/L (ref 22–32)
Calcium: 9.2 mg/dL (ref 8.9–10.3)
Chloride: 108 mmol/L (ref 98–111)
Creatinine: 0.65 mg/dL (ref 0.44–1.00)
GFR, Estimated: >60 mL/min (ref >60)
Glucose, Bld: 94 mg/dL (ref 70–99)
Potassium: 4.1 mmol/L (ref 3.5–5.1)
Sodium: 140 mmol/L (ref 135–145)
Total Bilirubin: 0.3 mg/dL (ref 0.3–1.2)
Total Protein: 6.3 g/dL — ABNORMAL LOW (ref 6.5–8.1)

## 2022-02-06 LAB — CBC WITH DIFFERENTIAL (CANCER CENTER ONLY)
Abs Immature Granulocytes: 0.02 10*3/uL (ref 0.00–0.07)
Basophils Absolute: 0 10*3/uL (ref 0.0–0.1)
Basophils Relative: 0 %
Eosinophils Absolute: 0.2 10*3/uL (ref 0.0–0.5)
Eosinophils Relative: 4 %
HCT: 37.4 % (ref 36.0–46.0)
Hemoglobin: 12.5 g/dL (ref 12.0–15.0)
Immature Granulocytes: 0 %
Lymphocytes Relative: 24 %
Lymphs Abs: 1.5 10*3/uL (ref 0.7–4.0)
MCH: 31.8 pg (ref 26.0–34.0)
MCHC: 33.4 g/dL (ref 30.0–36.0)
MCV: 95.2 fL (ref 80.0–100.0)
Monocytes Absolute: 0.5 10*3/uL (ref 0.1–1.0)
Monocytes Relative: 8 %
Neutro Abs: 4 10*3/uL (ref 1.7–7.7)
Neutrophils Relative %: 64 %
Platelet Count: 186 10*3/uL (ref 150–400)
RBC: 3.93 MIL/uL (ref 3.87–5.11)
RDW: 13.5 % (ref 11.5–15.5)
WBC Count: 6.2 10*3/uL (ref 4.0–10.5)
nRBC: 0 % (ref 0.0–0.2)

## 2022-02-06 MED ORDER — SODIUM CHLORIDE 0.9% FLUSH
10.0000 mL | Freq: Once | INTRAVENOUS | Status: AC
  Administered 2022-02-06: 10 mL

## 2022-02-06 MED ORDER — HEPARIN SOD (PORK) LOCK FLUSH 100 UNIT/ML IV SOLN
500.0000 [IU] | Freq: Once | INTRAVENOUS | Status: AC
  Administered 2022-02-06: 500 [IU]

## 2022-03-15 DIAGNOSIS — Z96651 Presence of right artificial knee joint: Secondary | ICD-10-CM | POA: Diagnosis not present

## 2022-03-15 DIAGNOSIS — M1712 Unilateral primary osteoarthritis, left knee: Secondary | ICD-10-CM | POA: Diagnosis not present

## 2022-03-16 ENCOUNTER — Telehealth: Payer: Self-pay | Admitting: *Deleted

## 2022-03-16 NOTE — Telephone Encounter (Signed)
Scheduled the patient for a follow up appt with Dr Berline Lopes on 8/21

## 2022-03-20 ENCOUNTER — Other Ambulatory Visit: Payer: Self-pay | Admitting: Family Medicine

## 2022-03-20 DIAGNOSIS — N3281 Overactive bladder: Secondary | ICD-10-CM

## 2022-03-23 ENCOUNTER — Other Ambulatory Visit: Payer: Self-pay

## 2022-03-23 ENCOUNTER — Other Ambulatory Visit: Payer: Self-pay | Admitting: Family Medicine

## 2022-03-23 ENCOUNTER — Telehealth: Payer: Self-pay | Admitting: Family Medicine

## 2022-03-23 DIAGNOSIS — B009 Herpesviral infection, unspecified: Secondary | ICD-10-CM

## 2022-03-23 MED ORDER — PANTOPRAZOLE SODIUM 40 MG PO TBEC: 40.0000 mg | DELAYED_RELEASE_TABLET | Freq: Every day | ORAL | 1 refills | Status: DC

## 2022-03-23 NOTE — Telephone Encounter (Signed)
Sent to pharmacy. Patient notified and verbalized understanding

## 2022-03-23 NOTE — Telephone Encounter (Signed)
  Prescription Request  03/23/2022  Is this a "Controlled Substance" medicine? pantoprazole (PROTONIX) 40 MG tablet   Have you seen your PCP in the last 2 weeks? No upcoming apt 07/18/2022  If YES, route message to pool  -  If NO, patient needs to be scheduled for appointment.  What is the name of the medication or equipment? pantoprazole (PROTONIX) 40 MG tablet --90day  Have you contacted your pharmacy to request a refill? NO. Pt aware d/c but asking for a refill  Which pharmacy would you like this sent to? [facility]   Patient notified that their request is being sent to the clinical staff for review and that they should receive a response within 2 business days.

## 2022-03-29 ENCOUNTER — Telehealth: Payer: Self-pay

## 2022-03-29 NOTE — Telephone Encounter (Signed)
I spoke to patient regarding updating meaningful Use for upcoming appointment with Dr. Berline Lopes on 8/21.  Pt asked if her port flush appointment on 9/7 could be moved up to Monday as well. She lives in [place] and wanted to have it all done in the same day to cut back on commute.  Per [facility] department scheduler, it was fine to move appointment.   Port flush is on 8/21 @ 3:00. Pt aware she may need to reschedule the next port flush that is scheduled in November.

## 2022-03-30 ENCOUNTER — Other Ambulatory Visit: Payer: Self-pay | Admitting: Family Medicine

## 2022-03-30 DIAGNOSIS — I10 Essential (primary) hypertension: Secondary | ICD-10-CM

## 2022-04-02 ENCOUNTER — Inpatient Hospital Stay: Payer: Medicare Other

## 2022-04-02 ENCOUNTER — Inpatient Hospital Stay: Payer: Medicare Other | Attending: Gynecologic Oncology | Admitting: Gynecologic Oncology

## 2022-04-02 ENCOUNTER — Encounter: Payer: Self-pay | Admitting: Gynecologic Oncology

## 2022-04-02 ENCOUNTER — Other Ambulatory Visit: Payer: Self-pay

## 2022-04-02 VITALS — BP 123/73 | HR 77 | Temp 97.9°F | Resp 19 | Wt 228.4 lb

## 2022-04-02 DIAGNOSIS — Z90722 Acquired absence of ovaries, bilateral: Secondary | ICD-10-CM | POA: Diagnosis not present

## 2022-04-02 DIAGNOSIS — Z452 Encounter for adjustment and management of vascular access device: Secondary | ICD-10-CM | POA: Insufficient documentation

## 2022-04-02 DIAGNOSIS — N9089 Other specified noninflammatory disorders of vulva and perineum: Secondary | ICD-10-CM

## 2022-04-02 DIAGNOSIS — Z90710 Acquired absence of both cervix and uterus: Secondary | ICD-10-CM | POA: Diagnosis not present

## 2022-04-02 DIAGNOSIS — C541 Malignant neoplasm of endometrium: Secondary | ICD-10-CM

## 2022-04-02 DIAGNOSIS — Z8542 Personal history of malignant neoplasm of other parts of uterus: Secondary | ICD-10-CM | POA: Insufficient documentation

## 2022-04-02 MED ORDER — HEPARIN SOD (PORK) LOCK FLUSH 100 UNIT/ML IV SOLN
500.0000 [IU] | Freq: Once | INTRAVENOUS | Status: AC
  Administered 2022-04-02: 500 [IU]

## 2022-04-02 MED ORDER — CLOBETASOL PROPIONATE 0.05 % EX OINT: 1.0000 | TOPICAL_OINTMENT | CUTANEOUS | 0 refills | Status: AC

## 2022-04-02 MED ORDER — SODIUM CHLORIDE 0.9% FLUSH
10.0000 mL | Freq: Once | INTRAVENOUS | Status: AC
  Administered 2022-04-02: 10 mL

## 2022-04-02 NOTE — Patient Instructions (Signed)
It was good to see you today.  I do not see or feel any evidence of cancer recurrence on your exam.  I will see you for follow-up in 3 months.  Please call if you develop any new and concerning symptoms before then.  I sent a prescription in for a steroid cream for your itching.  Please use this 3 times a week at night in the area that is itching.  If you do not notice an improvement in your symptoms over several weeks.  Please call to let me know.

## 2022-04-02 NOTE — Progress Notes (Signed)
Gynecologic Oncology Return Clinic Visit  04/02/22  Reason for Visit: surveillance in the setting of high risk uterine cancer  Treatment History: Oncology History Overview Note  MSI stable, endometrioid grade 2 Neg genetics   Endometrial cancer ([place])  12/19/2020 Initial Diagnosis   Endometrial cancer ([place])   12/26/2020 Imaging   1. Lower uterine segment/cervical mass without evidence of metastatic disease. 2. Hepatic steatosis. 3. Aortic atherosclerosis (ICD10-I70.0). Coronary artery calcification.   12/29/2020 Pathology Results   FINAL MICROSCOPIC DIAGNOSIS:   A. SENTINEL LYMPH NODE, RIGHT OBTURATOR, EXCISION:  -  No carcinoma identified in one lymph node (0/1)  -  See comment   B. SENTINEL LYMPH NODE, LEFT OBTURATOR, EXCISION:  -  Micrometastasis involving one lymph node (58m/1)  -  See comment   C. UTERUS, CERVIX, BILATERAL TUBES AND OVARIES:  Uterus:  -  Endometrioid carcinoma, FIGO grade 2  -  See oncology table and comment below   Cervix:  -  No carcinoma identified   Bilateral Ovaries:  -  No carcinoma identified   Bilateral Fallopian tubes:  -  No carcinoma identified   ONCOLOGY TABLE:   UTERUS, CARCINOMA OR CARCINOSARCOMA: Resection   Procedure: Total hysterectomy and bilateral salpingo-oophorectomy  Histologic Type: Endometrioid carcinoma  Histologic Grade: FIGO grade 2  Myometrial Invasion:       Percentage of Myometrial Invasion: Estimated to be less than 50%  Uterine Serosa Involvement: Not identified  Cervical stromal Involvement: Not identified  Extent of involvement of other tissue/organs: Not identified  Peritoneal/Ascitic Fluid: Not submitted/unknown  Lymphovascular Invasion: Not identified  Regional Lymph Nodes:       Pelvic Lymph Nodes Examined:  2 Sentinel                                0 non-sentinel                                   2 total       Pelvic Lymph Nodes with Metastasis: 0                           Macrometastasis: (>2.0  mm): 0                          Micrometastasis: (>0.2 mm and < 2.0 mm): 1                            Isolated Tumor Cells (<0.2 mm): 0                       Laterality of Lymph Node with Tumor: 0                             Extracapsular Extension: N/A  Distant Metastasis:       Distant Site(s) Involved: N/A  Pathologic Stage Classification (pTNM, AJCC 8th Edition): pT1a, pN136m Ancillary Studies: MMR / MSI testing will be ordered  Representative Tumor Block: C1  Comment(s): Cytokeratin AE1/3 was performed on the lymph nodes (parts a and B) and supports the presence of micrometastasis (part B only). Drs. PaSaralyn Pilarnd CaColumb[facility] Lt[facility]viewed select parts of the case and agree with  the above diagnosis.   (v4.2.0.1)     12/29/2020 Surgery   Surgeon: Donaciano Eva Pre-operative Diagnosis: endometrial cancer grade 3    Operation: Robotic-assisted laparoscopic total hysterectomy with bilateral salpingoophorectomy, SLN biopsy   Surgeon: Donaciano Eva  Specimens: uterus, cervix, bilateral tubes and ovaries, right and left obturator SLN           01/22/2021 Cancer Staging   Staging form: Corpus Uteri - Carcinoma and Carcinosarcoma, AJCC 8th Edition - Pathologic stage from 01/22/2021: Stage IIIC1 (pT1a, pN31m, cM0) - Signed by GHeath Lark MD on 01/22/2021 Stage prefix: Initial diagnosis   01/25/2021 Procedure   Successful placement of a right IJ approach Power Port with ultrasound and fluoroscopic guidance. The catheter is ready for use.   02/07/2021 - 05/19/2021 Chemotherapy   Patient is on Treatment Plan : UTERINE Carboplatin AUC 6 / Paclitaxel q21d     02/11/2021 Genetic Testing   Negative hereditary cancer genetic testing: no pathogenic variants detected in [facility] Common Hereditary Cancers +RNA Panel.  The report date is February 11, 2021.    The Common Hereditary Cancers + RNA Panel offered by [facility] includes sequencing, deletion/duplication, and RNA testing of the following 47 genes:  APC, ATM, AXIN2, BARD1, BMPR1A, BRCA1, BRCA2, BRIP1, CDH1, CDK4*, CDKN2A (p14ARF)*, CDKN2A (p16INK4a)*, CHEK2, CTNNA1, DICER1, EPCAM (Deletion/duplication testing only), GREM1 (promoter region deletion/duplication testing only), KIT, MEN1, MLH1, MSH2, MSH3, MSH6, MUTYH, NBN, NF1, NHTL1, PALB2, PDGFRA*, PMS2, POLD1, POLE, PTEN, RAD50, RAD51C, RAD51D, SDHB, SDHC, SDHD, SMAD4, SMARCA4. STK11, TP53, TSC1, TSC2, and VHL.  The following genes were evaluated for sequence changes only: SDHA and HOXB13 c.251G>A variant only.  RNA analysis is not performed for the * genes.     06/14/2021 Imaging   1. Interval hysterectomy and bilateral salpingo oophorectomy. No findings of residual/recurrent malignancy. 2. Substantial lumbar spondylosis and degenerative disc disease. In addition, a right eccentric intervertebral spur at T8-9 causes prominent central narrowing of the thecal sac. 3. Other imaging findings of potential clinical significance: Aortic Atherosclerosis (ICD10-I70.0). Systemic atherosclerosis. Small type 1 hiatal hernia.       Interval History: Reports doing well.  Denies any vaginal bleeding or discharge.  Denies any abdominal or pelvic pain.  Has occasional swelling of her lower extremities, left greater than right, at baseline especially during the summer.  Has had occasional sharp pain in her head, very brief.  Recently saw her eye doctor and was told she had some puffiness around her eyes.  Reports regular bowel function.  Continues to have urge urinary incontinence and urgency, is on oxybutynin without much relief.  Has a small area on her right vulva that itches from time to time.    Past Medical/Surgical History: Past Medical History:  Diagnosis Date   Arthritis    Asthma    Diverticulosis    Dyspnea    with exertion    Endometrial hyperplasia    Esophageal stricture    Family history of breast cancer 01/26/2021   Foot pain    Fundic gland polyps of stomach, benign 07/10/2015    Gastritis    GERD (gastroesophageal reflux disease)    Hemorrhoid    Hiatal hernia    Hip pain    History of kidney stones    cystoscopy   History of surgery on arm    left humerus   Hyperlipidemia    Hypertension    Hypothyroidism    Knee pain    Metabolic syndrome    Obstructive sleep apnea  08/27/2017   pt denies    Oxygen deficiency    pt denies   PONV (postoperative nausea and vomiting)    Spinal headache    few headaches recently    Tendonitis of ankle    Vitamin D deficiency     Past Surgical History:  Procedure Laterality Date   ANAL FISSURE REPAIR     CATARACT EXTRACTION  May 2009   COLONOSCOPY  12/26/07   ESOPHAGEAL DILATION     ESOPHAGOGASTRODUODENOSCOPY  12/26/07, 05/13/08   EYE SURGERY Bilateral    bilateral cataract with lens implants   IR IMAGING GUIDED PORT INSERTION  01/25/2021   KNEE CARTILAGE SURGERY Right Sept 1999   ORIF HUMERUS FRACTURE Left 01/12/2020   Procedure: OPEN REDUCTION INTERNAL FIXATION (ORIF) LEFT HUMERUS FRACTURE;  Surgeon: Roseanne Kaufman, MD;  Location: [place];  Service: Orthopedics;  Laterality: Left;   ROBOTIC ASSISTED TOTAL HYSTERECTOMY WITH BILATERAL SALPINGO OOPHERECTOMY Bilateral 12/29/2020   Procedure: XI ROBOTIC ASSISTED TOTAL HYSTERECTOMY WITH BILATERAL SALPINGO OOPHORECTOMY;  Surgeon: Everitt Amber, MD;  Location: [facility];  Service: Gynecology;  Laterality: Bilateral;   SENTINEL NODE BIOPSY N/A 12/29/2020   Procedure: SENTINEL NODE BIOPSY;  Surgeon: Everitt Amber, MD;  Location: [facility];  Service: Gynecology;  Laterality: N/A;   TOTAL KNEE ARTHROPLASTY Right 10/07/2017   Procedure: RIGHT TOTAL KNEE ARTHROPLASTY;  Surgeon: Gaynelle Arabian, MD;  Location: [facility];  Service: Orthopedics;  Laterality: Right;  Adductor Block   UMBILICAL HERNIA REPAIR  05/20/06    Family History  Problem Relation Age of Onset   Colon cancer Mother 16   Heart attack Father    Heart disease Sister    Breast cancer Sister 69   Breast cancer Sister        dx 42s    Cancer Maternal Aunt 34       "female cancer", unknown type   Cancer Maternal Grandmother        unknown type; dx 32s   Cancer Maternal Grandfather        unknown type; dx 47s   Cancer Cousin        maternal female cousin; dx 40s; unknown type   Cancer Cousin        maternal female cousin; dx 81s; unknown type   Cancer Cousin        maternal female cousin; dx after 37; unknown type   Esophageal cancer Neg Hx    Stomach cancer Neg Hx    Rectal cancer Neg Hx     Social History   Socioeconomic History   Marital status: Married    Spouse name: Marcello Moores   Number of children: 2   Years of education: 14   Highest education level: Associate degree: academic program  Occupational History   Occupation: Retired  Tobacco Use   Smoking status: Never   Smokeless tobacco: Never  Scientific laboratory technician Use: Never used  Substance and Sexual Activity   Alcohol use: No   Drug use: No   Sexual activity: Not Currently    Birth control/protection: Post-menopausal  Other Topics Concern   Not on file  Social History Narrative   Married and retired   No EtOH, tobacco or drug use   Social Determinants of Health   Financial Resource Strain: Low Risk  (05/12/2021)   Overall Financial Resource Strain (CARDIA)    Difficulty of Paying Living Expenses: Not hard at all  Food Insecurity: No Food Insecurity (05/12/2021)   Hunger Vital Sign  Worried About Charity fundraiser in the Last Year: Never true    [place] in the Last Year: Never true  Transportation Needs: No Transportation Needs (05/12/2021)   PRAPARE - Hydrologist (Medical): No    Lack of Transportation (Non-Medical): No  Physical Activity: Inactive (05/12/2021)   Exercise Vital Sign    Days of Exercise per Week: 0 days    Minutes of Exercise per Session: 0 min  Stress: Stress Concern Present (05/12/2021)   [place]    Feeling of  Stress : To some extent  Social Connections: Socially Integrated (05/12/2021)   Social Connection and Isolation Panel [NHANES]    Frequency of Communication with Friends and Family: More than three times a week    Frequency of Social Gatherings with Friends and Family: More than three times a week    Attends Religious Services: More than 4 times per year    Active Member of [employer] or Organizations: Yes    Attends Archivist Meetings: 1 to 4 times per year    Marital Status: Married    Current Medications:  Current Outpatient Medications:    acetaminophen (TYLENOL) 500 MG tablet, Take 1,000 mg by mouth as needed (pain.)., Disp: , Rfl:    albuterol (VENTOLIN HFA) 108 (90 Base) MCG/ACT inhaler, Inhale 2 puffs into the lungs every 6 (six) hours as needed for wheezing or shortness of breath. (Patient taking differently: Inhale 2 puffs into the lungs as needed for wheezing or shortness of breath. PRN), Disp: 6.7 g, Rfl: 1   budesonide-formoterol (SYMBICORT) 80-4.5 MCG/ACT inhaler, Inhale 2 puffs into the lungs 2 (two) times daily., Disp: 3 each, Rfl: 11   Calcium Carbonate-Vit D-Min (CALCIUM 1200 PO), Take 1 tablet by mouth in the morning., Disp: , Rfl:    Cholecalciferol (VITAMIN D3) 2000 units TABS, Take 2,000 Units by mouth in the morning., Disp: , Rfl:    DULoxetine (CYMBALTA) 30 MG capsule, Take 1 capsule (30 mg total) by mouth daily., Disp: 90 capsule, Rfl: 3   estradiol (ESTRACE VAGINAL) 0.1 MG/GM vaginal cream, Place 1 Applicatorful vaginally 3 (three) times a week. Use a finger tip amount within the vagina, Disp: 42.5 g, Rfl: 12   levothyroxine (SYNTHROID) 50 MCG tablet, Take 1 tablet (50 mcg total) by mouth daily before breakfast., Disp: 90 tablet, Rfl: 3   lidocaine-prilocaine (EMLA) cream, Apply to affected area once, Disp: 30 g, Rfl: 3   losartan-hydrochlorothiazide (HYZAAR) 100-12.5 MG tablet, Take 1 tablet by mouth daily., Disp: 100 tablet, Rfl: 3   nystatin  (MYCOSTATIN/NYSTOP) powder, Apply 1 application. topically 3 (three) times daily., Disp: 15 g, Rfl: 1   Omega-3 Fatty Acids (FISH OIL) 1000 MG CAPS, Take 1,000 mg by mouth in the morning., Disp: , Rfl:    oxybutynin (DITROPAN) 5 MG tablet, Take 2 tablets (10 mg total) by mouth 2 (two) times daily., Disp: 400 tablet, Rfl: 0   pantoprazole (PROTONIX) 40 MG tablet, Take 1 tablet (40 mg total) by mouth daily., Disp: 90 tablet, Rfl: 1   valACYclovir (VALTREX) 1000 MG tablet, TAKE 1 TABLET BY MOUTH TWICE  DAILY FOR 7 DAYS; IF HAVING  ANOTHER FLARE, TAKE 2 TABLETS  TWICE DAILY FOR 1 DAY ONLY PER  FLAREUP, Disp: 100 tablet, Rfl: 0   vitamin B-12 (CYANOCOBALAMIN) 1000 MCG tablet, Take 1,000 mcg by mouth in the morning., Disp: , Rfl:    furosemide (  LASIX) 20 MG tablet, TAKE 1 TABLET BY MOUTH DAILY AS  NEEDED FOR FLUID, Disp: 100 tablet, Rfl: 0  Review of Systems: + increased appetite, some headaches, some swelling of legs. Denies fevers, chills, fatigue, unexplained weight changes. Denies hearing loss, neck lumps or masses, mouth sores, ringing in ears or voice changes. Denies cough or wheezing.  Denies shortness of breath. Denies chest pain or palpitations.  Denies abdominal distention, pain, blood in stools, constipation, diarrhea, nausea, vomiting, or early satiety. Denies pain with intercourse, dysuria, frequency, hematuria or incontinence. Denies hot flashes, pelvic pain, vaginal bleeding or vaginal discharge.   Denies joint pain, back pain or muscle pain/cramps. Denies itching, rash, or wounds. Denies dizziness, numbness or seizures. Denies swollen lymph nodes or glands, denies easy bruising or bleeding. Denies anxiety, depression, confusion, or decreased concentration.  Physical Exam: BP 123/73 (BP Location: Right Arm, Patient Position: Sitting)   Pulse 77   Temp 97.9 F (36.6 C) (Oral)   Resp 19   Wt 228 lb 7 oz (103.6 kg)   SpO2 99%   BMI 43.16 kg/m  General: Alert, oriented, no acute  distress. HEENT: Normocephalic, atraumatic, sclera anicteric. Some white patches on her tongue. Chest: Clear to auscultation bilaterally.  No wheezes or rhonchi. Cardiovascular: Regular rate and rhythm, no murmurs. Abdomen: Obese, soft, nontender.  Normoactive bowel sounds.  No masses or hepatosplenomegaly appreciated.  Well-healed incisions. Extremities: Grossly normal range of motion.  Warm, well perfused.  No edema bilaterally. Skin: No rashes or lesions noted. Lymphatics: No cervical, supraclavicular, or inguinal adenopathy. GU: Normal appearing external genitalia without erythema, excoriation, or lesions.  No loss of architecture, no visible lesions along the right labia.  Speculum exam reveals moderately atrophic vaginal mucosa, no lesions or masses, no bleeding or discharge noted.  Mild tenderness with palpation and during the exam.  Laxity of the vaginal wall noted.  Bimanual exam reveals cuff and vaginal mucosa smooth, no nodularity.  Rectovaginal exam confirms these findings.    Laboratory & Radiologic Studies:    Latest Ref Rng & Units 02/06/2022   10:24 AM 11/13/2021    2:03 PM 10/02/2021   10:16 AM  CBC  WBC 4.0 - 10.5 K/uL 6.2  6.0  7.0   Hemoglobin 12.0 - 15.0 g/dL 12.5  12.3  13.1   Hematocrit 36.0 - 46.0 % 37.4  37.8  39.4   Platelets 150 - 400 K/uL 186  206  217       Latest Ref Rng & Units 02/06/2022   10:24 AM 11/13/2021    2:03 PM 10/02/2021   10:16 AM  BMP  Glucose 70 - 99 mg/dL 94  105  91   BUN 8 - 23 mg/dL 20  16  22    Creatinine 0.44 - 1.00 mg/dL 0.65  0.57  0.72   Sodium 135 - 145 mmol/L 140  140  139   Potassium 3.5 - 5.1 mmol/L 4.1  3.8  3.9   Chloride 98 - 111 mmol/L 108  106  103   CO2 22 - 32 mmol/L 31  29  31    Calcium 8.9 - 10.3 mg/dL 9.2  9.5  9.4    Assessment & Plan: Stacie Russell is a 71 y.o. 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  Completed adjuvant chemotherapy in 05/2021.  Posttreatment imaging  shows no evidence of disease.   Patient is overall doing well and is NED on my exam today.   She continues  to have some vulvar itching.  I do not see any visible lesions nor do I see erythema.  Discussed 1 of 2 options.  The first would be to try treating her with Diflucan again for possible yeast infection.  The second would be to try clobetasol for several weeks.  If she has no improvement of her symptoms, I have asked her to call.  If she does have improvement, can continue using steroid cream.  We will plan to do the latter of these two.  If she calls and has not had improvement in her symptoms, we will plan for multidose treatment of yeast with Diflucan.   She continues to have issues with urinary frequency and incontinence.  [facility] and finished pelvic floor physical therapy.  She is on oxybutynin without much relief.   I will plan to see her back in 3 months for surveillance visit.  Discussed signs and symptoms that would be concerning for cancer recurrence and stressed the importance of calling if she develops any of these.  22 minutes of total time was spent for this patient encounter, including preparation, face-to-face counseling with the patient and coordination of care, and documentation of the encounter.  Jeral Pinch, MD  Division of Gynecologic Oncology  Department of Obstetrics and Gynecology  [facility]

## 2022-04-18 ENCOUNTER — Inpatient Hospital Stay: Payer: Medicare Other

## 2022-05-14 ENCOUNTER — Ambulatory Visit (INDEPENDENT_AMBULATORY_CARE_PROVIDER_SITE_OTHER): Payer: Medicare Other

## 2022-05-14 DIAGNOSIS — Z0000 Encounter for general adult medical examination without abnormal findings: Secondary | ICD-10-CM

## 2022-05-14 NOTE — Progress Notes (Signed)
MEDICARE ANNUAL WELLNESS VISIT  05/14/2022  Telephone Visit Disclaimer This Medicare AWV was conducted by telephone due to national recommendations for restrictions regarding the COVID-19 Pandemic (e.g. social distancing).  I verified, using two identifiers, that I am speaking with Stacie Russell or their authorized healthcare agent. I discussed the limitations, risks, security, and privacy concerns of performing an evaluation and management service by telephone and the potential availability of an in-person appointment in the future. The patient expressed understanding and agreed to proceed.  Location of Patient: Home Location of Provider (nurse):  [facility]  Subjective:    Stacie Russell is a 71 y.o. female patient of Janora Norlander, DO who had a Medicare Annual Wellness Visit today via telephone. Stacie Russell is Retired and lives with their spouse. She has two children. She reports that she is socially active and does interact with friends/family regularly. 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    Patient Care Team: Janora Norlander, DO as PCP - General (Family Medicine) Lorretta Harp, MD as PCP - Cardiology (Cardiology) Irine Seal, MD (Urology) Paula Compton, MD (Obstetrics and Gynecology) Gatha Mayer, MD (Gastroenterology) Gaynelle Arabian, MD as Consulting Physician (Orthopedic Surgery) Heath Lark, MD as Consulting Physician (Hematology and Oncology)     05/14/2022    1:20 PM 08/03/2021    2:39 PM 05/12/2021   12:54 PM 05/03/2021    4:38 PM 03/21/2021   11:59 AM 02/07/2021    9:17 AM 01/25/2021    8:22 AM  Advanced Directives  Does Patient Have a Medical Advance Directive? Yes Yes Yes Yes Yes Yes No  Type of Paramedic of [place];Living will [place];Living will [place];Living  will [place];Living will [place];Living will    Does patient want to make changes to medical advance directive? No - Patient declined No - Patient declined  No - Patient declined     Copy of [place] in Chart? No - copy requested  Yes - validated most recent copy scanned in chart (See row information) No - copy requested No - copy requested No - copy requested   Would patient like information on creating a medical advance directive?       No - Patient declined    Hospital Utilization Over the Past 12 Months: # of hospitalizations or ER visits: 0 # of surgeries: 0  Review of Systems    Patient reports that her overall health is better compared to last year.  History obtained from chart review and the patient  Patient Reported Readings (BP, Pulse, CBG, Weight, etc) none  Pain Assessment Pain : No/denies pain     Current Medications & Allergies (verified) Allergies as of 05/14/2022       Reactions   Tramadol Other (See Comments)   Unsure of reaction        Medication List        Accurate as of May 14, 2022  1:22 PM. If you have any questions, ask your nurse or doctor.          acetaminophen 500 MG tablet Commonly known as: TYLENOL Take 1,000 mg by mouth as needed (pain.).   albuterol 108 (90 Base) MCG/ACT inhaler Commonly known as: VENTOLIN HFA Inhale 2 puffs into the lungs every 6 (six) hours as needed  for wheezing or shortness of breath. What changed:  when to take this additional instructions   budesonide-formoterol 80-4.5 MCG/ACT inhaler Commonly known as: SYMBICORT Inhale 2 puffs into the lungs 2 (two) times daily. What changed:  when to take this reasons to take this   CALCIUM 1200 PO Take 1 tablet by mouth in the morning.   clobetasol ointment 0.05 % Commonly known as: TEMOVATE Apply 1 Application topically 3 (three) times a week. Apply cream to the area where you have had itching 3  times a week at night.   cyanocobalamin 1000 MCG tablet Commonly known as: VITAMIN B12 Take 1,000 mcg by mouth in the morning.   DULoxetine 30 MG capsule Commonly known as: CYMBALTA Take 1 capsule (30 mg total) by mouth daily.   estradiol 0.1 MG/GM vaginal cream Commonly known as: ESTRACE VAGINAL Place 1 Applicatorful vaginally 3 (three) times a week. Use a finger tip amount within the vagina   Fish Oil 1000 MG Caps Take 1,000 mg by mouth in the morning.   furosemide 20 MG tablet Commonly known as: LASIX TAKE 1 TABLET BY MOUTH DAILY AS  NEEDED FOR FLUID   levothyroxine 50 MCG tablet Commonly known as: SYNTHROID Take 1 tablet (50 mcg total) by mouth daily before breakfast.   lidocaine-prilocaine cream Commonly known as: EMLA Apply to affected area once   losartan-hydrochlorothiazide 100-12.5 MG tablet Commonly known as: HYZAAR Take 1 tablet by mouth daily.   nystatin powder Commonly known as: MYCOSTATIN/NYSTOP Apply 1 application. topically 3 (three) times daily.   oxybutynin 5 MG tablet Commonly known as: DITROPAN Take 2 tablets (10 mg total) by mouth 2 (two) times daily.   pantoprazole 40 MG tablet Commonly known as: PROTONIX Take 1 tablet (40 mg total) by mouth daily.   valACYclovir 1000 MG tablet Commonly known as: VALTREX TAKE 1 TABLET BY MOUTH TWICE  DAILY FOR 7 DAYS; IF HAVING  ANOTHER FLARE, TAKE 2 TABLETS  TWICE DAILY FOR 1 DAY ONLY PER  FLAREUP   Vitamin D3 50 MCG (2000 UT) Tabs Take 2,000 Units by mouth in the morning.        History (reviewed): Past Medical History:  Diagnosis Date   Arthritis    Asthma    Diverticulosis    Dyspnea    with exertion    Endometrial hyperplasia    Esophageal stricture    Family history of breast cancer 01/26/2021   Foot pain    Fundic gland polyps of stomach, benign 07/10/2015   Gastritis    GERD (gastroesophageal reflux disease)    Hemorrhoid    Hiatal hernia    Hip pain    History of kidney stones     cystoscopy   History of surgery on arm    left humerus   Hyperlipidemia    Hypertension    Hypothyroidism    Knee pain    Metabolic syndrome    Obstructive sleep apnea 08/27/2017   pt denies    Oxygen deficiency    pt denies   PONV (postoperative nausea and vomiting)    Spinal headache    few headaches recently    Tendonitis of ankle    Vitamin D deficiency    Past Surgical History:  Procedure Laterality Date   ANAL FISSURE REPAIR     CATARACT EXTRACTION  May 2009   COLONOSCOPY  12/26/07   ESOPHAGEAL DILATION     ESOPHAGOGASTRODUODENOSCOPY  12/26/07, 05/13/08   EYE SURGERY Bilateral    bilateral cataract  with lens implants   IR IMAGING GUIDED PORT INSERTION  01/25/2021   KNEE CARTILAGE SURGERY Right Sept 1999   ORIF HUMERUS FRACTURE Left 01/12/2020   Procedure: OPEN REDUCTION INTERNAL FIXATION (ORIF) LEFT HUMERUS FRACTURE;  Surgeon: Roseanne Kaufman, MD;  Location: [facility];  Service: Orthopedics;  Laterality: Left;   ROBOTIC ASSISTED TOTAL HYSTERECTOMY WITH BILATERAL SALPINGO OOPHERECTOMY Bilateral 12/29/2020   Procedure: XI ROBOTIC ASSISTED TOTAL HYSTERECTOMY WITH BILATERAL SALPINGO OOPHORECTOMY;  Surgeon: Everitt Amber, MD;  Location: [facility];  Service: Gynecology;  Laterality: Bilateral;   SENTINEL NODE BIOPSY N/A 12/29/2020   Procedure: SENTINEL NODE BIOPSY;  Surgeon: Everitt Amber, MD;  Location: [facility];  Service: Gynecology;  Laterality: N/A;   TOTAL KNEE ARTHROPLASTY Right 10/07/2017   Procedure: RIGHT TOTAL KNEE ARTHROPLASTY;  Surgeon: Gaynelle Arabian, MD;  Location: [facility];  Service: Orthopedics;  Laterality: Right;  Adductor Block   UMBILICAL HERNIA REPAIR  05/20/06   Family History  Problem Relation Age of Onset   Colon cancer Mother 65   Heart attack Father    Heart disease Sister    Breast cancer Sister 68   Breast cancer Sister        dx 17s   Cancer Maternal Aunt 40       "female cancer", unknown type   Cancer Maternal Grandmother        unknown type; dx 31s   Cancer  Maternal Grandfather        unknown type; dx 50s   Cancer Cousin        maternal female cousin; dx 56s; unknown type   Cancer Cousin        maternal female cousin; dx 21s; unknown type   Cancer Cousin        maternal female cousin; dx after 63; unknown type   Esophageal cancer Neg Hx    Stomach cancer Neg Hx    Rectal cancer Neg Hx    Social History   Socioeconomic History   Marital status: Married    Spouse name: Marcello Moores   Number of children: 2   Years of education: 14   Highest education level: Associate degree: academic program  Occupational History   Occupation: Retired  Tobacco Use   Smoking status: Never   Smokeless tobacco: Never  Scientific laboratory technician Use: Never used  Substance and Sexual Activity   Alcohol use: No   Drug use: No   Sexual activity: Not Currently    Birth control/protection: Post-menopausal  Other Topics Concern   Not on file  Social History Narrative   Married and retired   No EtOH, tobacco or drug use   Social Determinants of Health   Financial Resource Strain: Low Risk  (05/12/2021)   Overall Financial Resource Strain (CARDIA)    Difficulty of Paying Living Expenses: Not hard at all  Food Insecurity: No Food Insecurity (05/12/2021)   Hunger Vital Sign    Worried About Running Out of Food in the Last Year: Never true    [place] in the Last Year: Never true  Transportation Needs: No Transportation Needs (05/12/2021)   PRAPARE - Hydrologist (Medical): No    Lack of Transportation (Non-Medical): No  Physical Activity: Inactive (05/12/2021)   Exercise Vital Sign    Days of Exercise per Week: 0 days    Minutes of Exercise per Session: 0 min  Stress: Stress Concern Present (05/12/2021)   [place] -  Occupational Stress Questionnaire    Feeling of Stress : To some extent  Social Connections: Socially Integrated (05/12/2021)   Social Connection and Isolation Panel [NHANES]     Frequency of Communication with Friends and Family: More than three times a week    Frequency of Social Gatherings with Friends and Family: More than three times a week    Attends Religious Services: More than 4 times per year    Active Member of [employer] or Organizations: Yes    Attends Archivist Meetings: 1 to 4 times per year    Marital Status: Married    Activities of Daily Living    05/14/2022    1:20 PM  In your present state of health, do you have any difficulty performing the following activities:  Hearing? 0  Vision? 0  Difficulty concentrating or making decisions? 0  Walking or climbing stairs? 0  Dressing or bathing? 0  Doing errands, shopping? 0  Preparing Food and eating ? N  Using the Toilet? N  In the past six months, have you accidently leaked urine? N  Do you have problems with loss of bowel control? N  Managing your Medications? N  Managing your Finances? N  Housekeeping or managing your Housekeeping? N    Patient Education/ Literacy How often do you need to have someone help you when you read instructions, pamphlets, or other written materials from your doctor or pharmacy?: 1 - Never  Exercise Current Exercise Habits: The patient does not participate in regular exercise at present, Exercise limited by: None identified  Diet Patient reports consuming 3 meals a day and 2 snack(s) a day Patient reports that her primary diet is: Regular Patient reports that she does have regular access to food.   Depression Screen    07/19/2021   10:35 AM 05/12/2021   12:50 PM 03/22/2021    1:53 PM 08/31/2020   10:17 AM 03/01/2020   10:01 AM 08/20/2019   10:19 AM 04/29/2019   10:42 AM  PHQ 2/9 Scores  PHQ - 2 Score '1 3 3 '$ 0 0 0 0  PHQ- 9 Score '8 10 11 '$ 0  0      Fall Risk    05/14/2022    1:22 PM 07/19/2021   10:34 AM 05/12/2021   12:54 PM 03/22/2021    1:53 PM 08/31/2020   10:17 AM  Fall Risk   Falls in the past year? 0 0 0 0 1  Number falls in past yr:   0  1   Injury with Fall?   0  1  Risk for fall due to :   Other (Comment)  History of fall(s)  Risk for fall due to: Comment   fatigue, weakness from chemo treatments    Follow up Falls evaluation completed  Falls prevention discussed  Falls prevention discussed     Objective:  Stacie Russell seemed alert and oriented and she participated appropriately during our telephone visit.  Blood Pressure Weight BMI  BP Readings from Last 3 Encounters:  04/02/22 123/73  02/06/22 (!) 153/74  01/19/22 134/79   Wt Readings from Last 3 Encounters:  04/02/22 228 lb 7 oz (103.6 kg)  02/06/22 220 lb (99.8 kg)  01/19/22 218 lb 6.4 oz (99.1 kg)   BMI Readings from Last 1 Encounters:  04/02/22 43.16 kg/m    *Unable to obtain current vital signs, weight, and BMI due to telephone visit type  Hearing/Vision  Stacie Russell did not seem to have difficulty  with hearing/understanding during the telephone conversation Reports that she has had a formal eye exam by an eye care professional within the past year Reports that she has not had a formal hearing evaluation within the past year *Unable to fully assess hearing and vision during telephone visit type  Cognitive Function:    05/14/2022    1:21 PM 05/11/2020    2:04 PM 04/29/2019   10:46 AM  6CIT Screen  What Year? 0 points 0 points 0 points  What month? 0 points 0 points 0 points  What time? 0 points 0 points 0 points  Count back from 20 0 points 0 points 0 points  Months in reverse 0 points 0 points 0 points  Repeat phrase 0 points 0 points 0 points  Total Score 0 points 0 points 0 points   (Normal:0-7, Significant for Dysfunction: >8)  Normal Cognitive Function Screening: Yes   Immunization & Health Maintenance Record Immunization History  Administered Date(s) Administered   Fluad Quad(high Dose 65+) 06/16/2019, 08/31/2020, 05/19/2021   Influenza Whole 07/25/2012   Influenza, High Dose Seasonal PF 06/11/2017, 06/25/2018   Influenza,inj,Quad  PF,6+ Mos 05/06/2013, 06/25/2014, 06/20/2015   Moderna SARS-COV2 Booster Vaccination 08/31/2020   Moderna Sars-Covid-2 Vaccination 09/14/2019, 10/05/2019   Pneumococcal Conjugate-13 05/06/2013   Pneumococcal Polysaccharide-23 09/05/2016   Tdap 04/26/2011   Zoster, Live 04/26/2011    Health Maintenance  Topic Date Due   Zoster Vaccines- Shingrix (1 of 2) Never done   COVID-19 Vaccine (3 - Moderna risk series) 09/28/2020   TETANUS/TDAP  07/19/2022 (Originally 04/25/2021)   INFLUENZA VACCINE  11/11/2022 (Originally 03/13/2022)   MAMMOGRAM  07/11/2023   COLONOSCOPY (Pts 45-83yr Insurance coverage will need to be confirmed)  06/27/2026   Pneumonia Vaccine 71 Years old  Completed   DEXA SCAN  Completed   Hepatitis C Screening  Completed   HPV VACCINES  Aged Out       Assessment  This is a routine wellness examination for Stacie Russell  Health Maintenance: Due or Overdue Health Maintenance Due  Topic Date Due   Zoster Vaccines- Shingrix (1 of 2) Never done   COVID-19 Vaccine (3 - Moderna risk series) 09/28/2020    PYolonda Kidadoes not need a referral for Community Assistance: Care Management:   no Social Work:    no Prescription Assistance:  no Nutrition/Diabetes Education:  no   Plan:  Personalized Goals  Goals Addressed             This Visit's Progress    Patient Stated       05/14/2022 AWV Goal: Fall Prevention  Over the next year, patient will decrease their risk for falls by: Using assistive devices, such as a cane or walker, as needed Identifying fall risks within their home and correcting them by: Removing throw rugs Adding handrails to stairs or ramps Removing clutter and keeping a clear pathway throughout the home Increasing light, especially at night Adding shower handles/bars Raising toilet seat Identifying potential personal risk factors for falls: Medication side effects Incontinence/urgency Vestibular dysfunction Hearing  loss Musculoskeletal disorders Neurological disorders Orthostatic hypotension         Personalized Health Maintenance & Screening Recommendations  Influenza vaccine  Lung Cancer Screening Recommended: no (Low Dose CT Chest recommended if Age 73-80years, 30 pack-year currently smoking OR have quit w/in past 15 years) Hepatitis C Screening recommended: no HIV Screening recommended: no  Advanced Directives: Written information was not prepared per patient's request.  Referrals &  Orders No orders of the defined types were placed in this encounter.   Follow-up Plan Follow-up with Janora Norlander, DO as planned    I have personally reviewed and noted the following in the patient's chart:   Medical and social history Use of alcohol, tobacco or illicit drugs  Current medications and supplements Functional ability and status Nutritional status Physical activity Advanced directives List of other physicians Hospitalizations, surgeries, and ER visits in previous 12 months Vitals Screenings to include cognitive, depression, and falls Referrals and appointments  In addition, I have reviewed and discussed with Stacie Russell certain preventive protocols, quality metrics, and best practice recommendations. A written personalized care plan for preventive services as well as general preventive health recommendations is available and can be mailed to the patient at her request.      Stacie Coyer LPN    [id number]

## 2022-05-27 ENCOUNTER — Other Ambulatory Visit: Payer: Self-pay | Admitting: Family Medicine

## 2022-06-17 ENCOUNTER — Other Ambulatory Visit: Payer: Self-pay | Admitting: Family Medicine

## 2022-06-17 DIAGNOSIS — N3281 Overactive bladder: Secondary | ICD-10-CM

## 2022-06-19 ENCOUNTER — Inpatient Hospital Stay: Payer: Medicare Other

## 2022-06-19 ENCOUNTER — Other Ambulatory Visit: Payer: Self-pay

## 2022-06-19 ENCOUNTER — Inpatient Hospital Stay: Payer: Medicare Other | Attending: Gynecologic Oncology

## 2022-06-19 DIAGNOSIS — Z8542 Personal history of malignant neoplasm of other parts of uterus: Secondary | ICD-10-CM | POA: Diagnosis not present

## 2022-06-19 DIAGNOSIS — Z452 Encounter for adjustment and management of vascular access device: Secondary | ICD-10-CM | POA: Insufficient documentation

## 2022-06-19 DIAGNOSIS — Z90722 Acquired absence of ovaries, bilateral: Secondary | ICD-10-CM | POA: Diagnosis not present

## 2022-06-19 DIAGNOSIS — Z9221 Personal history of antineoplastic chemotherapy: Secondary | ICD-10-CM | POA: Diagnosis not present

## 2022-06-19 DIAGNOSIS — C541 Malignant neoplasm of endometrium: Secondary | ICD-10-CM

## 2022-06-19 DIAGNOSIS — Z90710 Acquired absence of both cervix and uterus: Secondary | ICD-10-CM | POA: Insufficient documentation

## 2022-06-19 MED ORDER — HEPARIN SOD (PORK) LOCK FLUSH 100 UNIT/ML IV SOLN
500.0000 [IU] | Freq: Once | INTRAVENOUS | Status: AC
  Administered 2022-06-19: 500 [IU]

## 2022-06-19 MED ORDER — SODIUM CHLORIDE 0.9% FLUSH
10.0000 mL | Freq: Once | INTRAVENOUS | Status: AC
  Administered 2022-06-19: 10 mL

## 2022-07-03 ENCOUNTER — Inpatient Hospital Stay (HOSPITAL_BASED_OUTPATIENT_CLINIC_OR_DEPARTMENT_OTHER): Payer: Medicare Other | Admitting: Gynecologic Oncology

## 2022-07-03 ENCOUNTER — Encounter: Payer: Self-pay | Admitting: Gynecologic Oncology

## 2022-07-03 VITALS — BP 147/62 | HR 73 | Temp 97.9°F | Resp 16 | Wt 233.4 lb

## 2022-07-03 DIAGNOSIS — Z452 Encounter for adjustment and management of vascular access device: Secondary | ICD-10-CM | POA: Diagnosis not present

## 2022-07-03 DIAGNOSIS — C541 Malignant neoplasm of endometrium: Secondary | ICD-10-CM

## 2022-07-03 DIAGNOSIS — L299 Pruritus, unspecified: Secondary | ICD-10-CM

## 2022-07-03 DIAGNOSIS — N9089 Other specified noninflammatory disorders of vulva and perineum: Secondary | ICD-10-CM

## 2022-07-03 DIAGNOSIS — Z9221 Personal history of antineoplastic chemotherapy: Secondary | ICD-10-CM | POA: Diagnosis not present

## 2022-07-03 DIAGNOSIS — Z8542 Personal history of malignant neoplasm of other parts of uterus: Secondary | ICD-10-CM | POA: Diagnosis not present

## 2022-07-03 NOTE — Patient Instructions (Signed)
It was good to see you today.  I do not see or feel any evidence of cancer recurrence on your exam.  I will see you for follow-up in 3 months.  As always, if you develop any new and concerning symptoms before your next visit, please call to see me sooner.  

## 2022-07-03 NOTE — Progress Notes (Signed)
Gynecologic Oncology Return Clinic Visit  07/03/22  Reason for Visit: surveillance in the setting of high risk uterine cancer   Treatment History: Oncology History Overview Note  MSI stable, endometrioid grade 2 Neg genetics   Endometrial cancer ([place])  12/19/2020 Initial Diagnosis   Endometrial cancer ([place])   12/26/2020 Imaging   1. Lower uterine segment/cervical mass without evidence of metastatic disease. 2. Hepatic steatosis. 3. Aortic atherosclerosis (ICD10-I70.0). Coronary artery calcification.   12/29/2020 Pathology Results   FINAL MICROSCOPIC DIAGNOSIS:   A. SENTINEL LYMPH NODE, RIGHT OBTURATOR, EXCISION:  -  No carcinoma identified in one lymph node (0/1)  -  See comment   B. SENTINEL LYMPH NODE, LEFT OBTURATOR, EXCISION:  -  Micrometastasis involving one lymph node (67m/1)  -  See comment   C. UTERUS, CERVIX, BILATERAL TUBES AND OVARIES:  Uterus:  -  Endometrioid carcinoma, FIGO grade 2  -  See oncology table and comment below   Cervix:  -  No carcinoma identified   Bilateral Ovaries:  -  No carcinoma identified   Bilateral Fallopian tubes:  -  No carcinoma identified   ONCOLOGY TABLE:   UTERUS, CARCINOMA OR CARCINOSARCOMA: Resection   Procedure: Total hysterectomy and bilateral salpingo-oophorectomy  Histologic Type: Endometrioid carcinoma  Histologic Grade: FIGO grade 2  Myometrial Invasion:       Percentage of Myometrial Invasion: Estimated to be less than 50%  Uterine Serosa Involvement: Not identified  Cervical stromal Involvement: Not identified  Extent of involvement of other tissue/organs: Not identified  Peritoneal/Ascitic Fluid: Not submitted/unknown  Lymphovascular Invasion: Not identified  Regional Lymph Nodes:       Pelvic Lymph Nodes Examined:  2 Sentinel                                0 non-sentinel                                   2 total       Pelvic Lymph Nodes with Metastasis: 0                           Macrometastasis: (>2.0  mm): 0                          Micrometastasis: (>0.2 mm and < 2.0 mm): 1                            Isolated Tumor Cells (<0.2 mm): 0                       Laterality of Lymph Node with Tumor: 0                             Extracapsular Extension: N/A  Distant Metastasis:       Distant Site(s) Involved: N/A  Pathologic Stage Classification (pTNM, AJCC 8th Edition): pT1a, pN187m Ancillary Studies: MMR / MSI testing will be ordered  Representative Tumor Block: C1  Comment(s): Cytokeratin AE1/3 was performed on the lymph nodes (parts a and B) and supports the presence of micrometastasis (part B only). Drs. PaSaralyn Pilarnd [facility]seviewed select parts of the case and agree  with the above diagnosis.   (v4.2.0.1)     12/29/2020 Surgery   Surgeon: Donaciano Eva Pre-operative Diagnosis: endometrial cancer grade 3    Operation: Robotic-assisted laparoscopic total hysterectomy with bilateral salpingoophorectomy, SLN biopsy   Surgeon: Donaciano Eva  Specimens: uterus, cervix, bilateral tubes and ovaries, right and left obturator SLN           01/22/2021 Cancer Staging   Staging form: Corpus Uteri - Carcinoma and Carcinosarcoma, AJCC 8th Edition - Pathologic stage from 01/22/2021: Stage IIIC1 (pT1a, pN47m, cM0) - Signed by GHeath Lark MD on 01/22/2021 Stage prefix: Initial diagnosis   01/25/2021 Procedure   Successful placement of a right IJ approach Power Port with ultrasound and fluoroscopic guidance. The catheter is ready for use.   02/07/2021 - 05/19/2021 Chemotherapy   Patient is on Treatment Plan : UTERINE Carboplatin AUC 6 / Paclitaxel q21d     02/11/2021 Genetic Testing   Negative hereditary cancer genetic testing: no pathogenic variants detected in [facility] Common Hereditary Cancers +RNA Panel.  The report date is February 11, 2021.    The Common Hereditary Cancers + RNA Panel offered by [facility] includes sequencing, deletion/duplication, and RNA testing of the following 47 genes:  APC, ATM, AXIN2, BARD1, BMPR1A, BRCA1, BRCA2, BRIP1, CDH1, CDK4*, CDKN2A (p14ARF)*, CDKN2A (p16INK4a)*, CHEK2, CTNNA1, DICER1, EPCAM (Deletion/duplication testing only), GREM1 (promoter region deletion/duplication testing only), KIT, MEN1, MLH1, MSH2, MSH3, MSH6, MUTYH, NBN, NF1, NHTL1, PALB2, PDGFRA*, PMS2, POLD1, POLE, PTEN, RAD50, RAD51C, RAD51D, SDHB, SDHC, SDHD, SMAD4, SMARCA4. STK11, TP53, TSC1, TSC2, and VHL.  The following genes were evaluated for sequence changes only: SDHA and HOXB13 c.251G>A variant only.  RNA analysis is not performed for the * genes.     06/14/2021 Imaging   1. Interval hysterectomy and bilateral salpingo oophorectomy. No findings of residual/recurrent malignancy. 2. Substantial lumbar spondylosis and degenerative disc disease. In addition, a right eccentric intervertebral spur at T8-9 causes prominent central narrowing of the thecal sac. 3. Other imaging findings of potential clinical significance: Aortic Atherosclerosis (ICD10-I70.0). Systemic atherosclerosis. Small type 1 hiatal hernia.       Interval History: Doing well.  Denies any abdominal or pelvic pain.  Denies any vaginal bleeding or discharge.  Continues to struggle with urinary incontinence, oxybutynin is helping some.  Reports normal bowel function.  Had resolution of her vulvar symptoms, occasionally will get vulvar itching now on the right, uses steroid cream once or twice with resolution of symptoms.  Children are coming over to help cook Thanksgiving dinner.  Past Medical/Surgical History: Past Medical History:  Diagnosis Date   Arthritis    Asthma    Diverticulosis    Dyspnea    with exertion    Endometrial hyperplasia    Esophageal stricture    Family history of breast cancer 01/26/2021   Foot pain    Fundic gland polyps of stomach, benign 07/10/2015   Gastritis    GERD (gastroesophageal reflux disease)    Hemorrhoid    Hiatal hernia    Hip pain    History of kidney stones    cystoscopy    History of surgery on arm    left humerus   Hyperlipidemia    Hypertension    Hypothyroidism    Knee pain    Metabolic syndrome    Obstructive sleep apnea 08/27/2017   pt denies    Oxygen deficiency    pt denies   PONV (postoperative nausea and vomiting)    Spinal headache  few headaches recently    Tendonitis of ankle    Vitamin D deficiency     Past Surgical History:  Procedure Laterality Date   ANAL FISSURE REPAIR     CATARACT EXTRACTION  May 2009   COLONOSCOPY  12/26/07   ESOPHAGEAL DILATION     ESOPHAGOGASTRODUODENOSCOPY  12/26/07, 05/13/08   EYE SURGERY Bilateral    bilateral cataract with lens implants   IR IMAGING GUIDED PORT INSERTION  01/25/2021   KNEE CARTILAGE SURGERY Right Sept 1999   ORIF HUMERUS FRACTURE Left 01/12/2020   Procedure: OPEN REDUCTION INTERNAL FIXATION (ORIF) LEFT HUMERUS FRACTURE;  Surgeon: Roseanne Kaufman, MD;  Location: [place];  Service: Orthopedics;  Laterality: Left;   ROBOTIC ASSISTED TOTAL HYSTERECTOMY WITH BILATERAL SALPINGO OOPHERECTOMY Bilateral 12/29/2020   Procedure: XI ROBOTIC ASSISTED TOTAL HYSTERECTOMY WITH BILATERAL SALPINGO OOPHORECTOMY;  Surgeon: Everitt Amber, MD;  Location: [facility];  Service: Gynecology;  Laterality: Bilateral;   SENTINEL NODE BIOPSY N/A 12/29/2020   Procedure: SENTINEL NODE BIOPSY;  Surgeon: Everitt Amber, MD;  Location: [facility];  Service: Gynecology;  Laterality: N/A;   TOTAL KNEE ARTHROPLASTY Right 10/07/2017   Procedure: RIGHT TOTAL KNEE ARTHROPLASTY;  Surgeon: Gaynelle Arabian, MD;  Location: [facility];  Service: Orthopedics;  Laterality: Right;  Adductor Block   UMBILICAL HERNIA REPAIR  05/20/06    Family History  Problem Relation Age of Onset   Colon cancer Mother 13   Heart attack Father    Heart disease Sister    Breast cancer Sister 4   Breast cancer Sister        dx 62s   Cancer Maternal Aunt 98       "female cancer", unknown type   Cancer Maternal Grandmother        unknown type; dx 81s   Cancer Maternal  Grandfather        unknown type; dx 69s   Cancer Cousin        maternal female cousin; dx 83s; unknown type   Cancer Cousin        maternal female cousin; dx 17s; unknown type   Cancer Cousin        maternal female cousin; dx after 8; unknown type   Esophageal cancer Neg Hx    Stomach cancer Neg Hx    Rectal cancer Neg Hx     Social History   Socioeconomic History   Marital status: Married    Spouse name: Marcello Moores   Number of children: 2   Years of education: 14   Highest education level: Associate degree: academic program  Occupational History   Occupation: Retired  Tobacco Use   Smoking status: Never   Smokeless tobacco: Never  Scientific laboratory technician Use: Never used  Substance and Sexual Activity   Alcohol use: No   Drug use: No   Sexual activity: Not Currently    Birth control/protection: Post-menopausal  Other Topics Concern   Not on file  Social History Narrative   Married and retired   No EtOH, tobacco or drug use   Social Determinants of Health   Financial Resource Strain: Low Risk  (05/12/2021)   Overall Financial Resource Strain (CARDIA)    Difficulty of Paying Living Expenses: Not hard at all  Food Insecurity: No Food Insecurity (05/12/2021)   Hunger Vital Sign    Worried About Running Out of Food in the Last Year: Never true    [place] in the Last Year: Never true  Transportation Needs:  No Transportation Needs (05/12/2021)   PRAPARE - Hydrologist (Medical): No    Lack of Transportation (Non-Medical): No  Physical Activity: Inactive (05/12/2021)   Exercise Vital Sign    Days of Exercise per Week: 0 days    Minutes of Exercise per Session: 0 min  Stress: Stress Concern Present (05/12/2021)   [place]    Feeling of Stress : To some extent  Social Connections: Socially Integrated (05/12/2021)   Social Connection and Isolation Panel [NHANES]    Frequency of  Communication with Friends and Family: More than three times a week    Frequency of Social Gatherings with Friends and Family: More than three times a week    Attends Religious Services: More than 4 times per year    Active Member of [employer] or Organizations: Yes    Attends Archivist Meetings: 1 to 4 times per year    Marital Status: Married    Current Medications:  Current Outpatient Medications:    acetaminophen (TYLENOL) 500 MG tablet, Take 1,000 mg by mouth as needed (pain.)., Disp: , Rfl:    albuterol (VENTOLIN HFA) 108 (90 Base) MCG/ACT inhaler, Inhale 2 puffs into the lungs every 6 (six) hours as needed for wheezing or shortness of breath. (Patient taking differently: Inhale 2 puffs into the lungs as needed for wheezing or shortness of breath. PRN), Disp: 6.7 g, Rfl: 1   budesonide-formoterol (SYMBICORT) 80-4.5 MCG/ACT inhaler, Inhale 2 puffs into the lungs 2 (two) times daily. (Patient taking differently: Inhale 2 puffs into the lungs 2 (two) times daily as needed.), Disp: 3 each, Rfl: 11   Calcium Carbonate-Vit D-Min (CALCIUM 1200 PO), Take 1 tablet by mouth in the morning., Disp: , Rfl:    Cholecalciferol (VITAMIN D3) 2000 units TABS, Take 2,000 Units by mouth in the morning., Disp: , Rfl:    clobetasol ointment (TEMOVATE) 1.44 %, Apply 1 Application topically 3 (three) times a week. Apply cream to the area where you have had itching 3 times a week at night., Disp: 30 g, Rfl: 0   DULoxetine (CYMBALTA) 30 MG capsule, Take 1 capsule (30 mg total) by mouth daily., Disp: 90 capsule, Rfl: 3   estradiol (ESTRACE VAGINAL) 0.1 MG/GM vaginal cream, Place 1 Applicatorful vaginally 3 (three) times a week. Use a finger tip amount within the vagina, Disp: 42.5 g, Rfl: 12   furosemide (LASIX) 20 MG tablet, TAKE 1 TABLET BY MOUTH DAILY AS  NEEDED FOR FLUID, Disp: 100 tablet, Rfl: 0   levothyroxine (SYNTHROID) 50 MCG tablet, Take 1 tablet (50 mcg total) by mouth daily before breakfast., Disp:  90 tablet, Rfl: 3   lidocaine-prilocaine (EMLA) cream, Apply to affected area once, Disp: 30 g, Rfl: 3   losartan-hydrochlorothiazide (HYZAAR) 100-12.5 MG tablet, Take 1 tablet by mouth daily., Disp: 100 tablet, Rfl: 3   nystatin (MYCOSTATIN/NYSTOP) powder, Apply 1 application. topically 3 (three) times daily. (Patient not taking: Reported on 05/14/2022), Disp: 15 g, Rfl: 1   Omega-3 Fatty Acids (FISH OIL) 1000 MG CAPS, Take 1,000 mg by mouth in the morning., Disp: , Rfl:    oxybutynin (DITROPAN) 5 MG tablet, TAKE 2 TABLETS BY MOUTH TWICE  DAILY, Disp: 400 tablet, Rfl: 0   pantoprazole (PROTONIX) 40 MG tablet, TAKE 1 TABLET BY MOUTH DAILY, Disp: 100 tablet, Rfl: 0   valACYclovir (VALTREX) 1000 MG tablet, TAKE 1 TABLET BY MOUTH TWICE  DAILY FOR 7 DAYS;  IF HAVING  ANOTHER FLARE, TAKE 2 TABLETS  TWICE DAILY FOR 1 DAY ONLY PER  FLAREUP, Disp: 100 tablet, Rfl: 0   vitamin B-12 (CYANOCOBALAMIN) 1000 MCG tablet, Take 1,000 mcg by mouth in the morning., Disp: , Rfl:   Review of Systems: Denies appetite changes, fevers, chills, fatigue, unexplained weight changes. Denies hearing loss, neck lumps or masses, mouth sores, ringing in ears or voice changes. Denies cough or wheezing.  Denies shortness of breath. Denies chest pain or palpitations. Denies leg swelling. Denies abdominal distention, pain, blood in stools, constipation, diarrhea, nausea, vomiting, or early satiety. Denies pain with intercourse, dysuria, frequency, hematuria or incontinence. Denies hot flashes, pelvic pain, vaginal bleeding or vaginal discharge.   Denies joint pain, back pain or muscle pain/cramps. Denies itching, rash, or wounds. Denies dizziness, headaches, numbness or seizures. Denies swollen lymph nodes or glands, denies easy bruising or bleeding. Denies anxiety, depression, confusion, or decreased concentration.  Physical Exam: BP (!) 147/62 (BP Location: Left Arm, Patient Position: Sitting)   Pulse 73   Temp 97.9 F (36.6  C) (Oral)   Resp 16   Wt 233 lb 6.4 oz (105.9 kg)   SpO2 99%   BMI 44.10 kg/m  General: Alert, oriented, no acute distress. HEENT: Normocephalic, atraumatic, sclera anicteric. Some white patches on her tongue. Chest: Clear to auscultation bilaterally.  No wheezes or rhonchi. Cardiovascular: Regular rate and rhythm, no murmurs. Abdomen: Obese, soft, nontender.  Normoactive bowel sounds.  No masses or hepatosplenomegaly appreciated.  Well-healed incisions. Extremities: Grossly normal range of motion.  Warm, well perfused.  No edema bilaterally. Skin: No rashes or lesions noted. Lymphatics: No cervical, supraclavicular, or inguinal adenopathy. GU: Normal appearing external genitalia without erythema, excoriation, or lesions.  No loss of architecture, no visible lesions along the right labia on exam today.  Speculum exam reveals moderately atrophic vaginal mucosa, no lesions or masses, no bleeding or discharge noted.  Similar tenderness with palpation during the exam.  Laxity of the vaginal wall noted.  Bimanual exam reveals cuff and vaginal mucosa smooth, no nodularity.  Rectovaginal exam confirms these findings.    Laboratory & Radiologic Studies: None new  Assessment & Plan: Stacie Russell is a 71 y.o. 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  Completed adjuvant chemotherapy in 05/2021.  Posttreatment imaging shows no evidence of disease.   Patient is overall doing well and is NED on exam today.   Vulvar itching has significantly improved.  She uses clobetasol as needed infrequently.  Discussed calling if she developed more frequent symptoms or refractory to this steroid cream.  She continues to have urinary frequency and incontinence, has some improvement with oxybutynin.  I will plan to see her back in 3 months for surveillance visit.  Discussed signs and symptoms that would be concerning for cancer recurrence and stressed the importance of  calling if she develops any of these.  20 minutes of total time was spent for this patient encounter, including preparation, face-to-face counseling with the patient and coordination of care, and documentation of the encounter.  Jeral Pinch, MD  Division of Gynecologic Oncology  Department of Obstetrics and Gynecology  [facility]

## 2022-07-03 NOTE — Addendum Note (Signed)
Addended by: [place], Chalmers Iddings B on: 07/03/2022 02:29 PM   Modules accepted: Orders

## 2022-07-16 DIAGNOSIS — Z1231 Encounter for screening mammogram for malignant neoplasm of breast: Secondary | ICD-10-CM | POA: Diagnosis not present

## 2022-07-18 ENCOUNTER — Encounter: Payer: Self-pay | Admitting: Family Medicine

## 2022-07-18 ENCOUNTER — Ambulatory Visit (INDEPENDENT_AMBULATORY_CARE_PROVIDER_SITE_OTHER): Payer: Medicare Other | Admitting: Family Medicine

## 2022-07-18 VITALS — BP 132/74 | HR 79 | Temp 98.2°F | Ht 61.0 in | Wt 230.6 lb

## 2022-07-18 DIAGNOSIS — I10 Essential (primary) hypertension: Secondary | ICD-10-CM | POA: Diagnosis not present

## 2022-07-18 DIAGNOSIS — Z0001 Encounter for general adult medical examination with abnormal findings: Secondary | ICD-10-CM | POA: Diagnosis not present

## 2022-07-18 DIAGNOSIS — G720 Drug-induced myopathy: Secondary | ICD-10-CM | POA: Diagnosis not present

## 2022-07-18 DIAGNOSIS — N3281 Overactive bladder: Secondary | ICD-10-CM

## 2022-07-18 DIAGNOSIS — Z6379 Other stressful life events affecting family and household: Secondary | ICD-10-CM

## 2022-07-18 DIAGNOSIS — Z0000 Encounter for general adult medical examination without abnormal findings: Secondary | ICD-10-CM

## 2022-07-18 DIAGNOSIS — E782 Mixed hyperlipidemia: Secondary | ICD-10-CM | POA: Diagnosis not present

## 2022-07-18 DIAGNOSIS — E039 Hypothyroidism, unspecified: Secondary | ICD-10-CM | POA: Diagnosis not present

## 2022-07-18 DIAGNOSIS — I700 Atherosclerosis of aorta: Secondary | ICD-10-CM

## 2022-07-18 DIAGNOSIS — C541 Malignant neoplasm of endometrium: Secondary | ICD-10-CM

## 2022-07-18 DIAGNOSIS — Z23 Encounter for immunization: Secondary | ICD-10-CM

## 2022-07-18 DIAGNOSIS — T466X5A Adverse effect of antihyperlipidemic and antiarteriosclerotic drugs, initial encounter: Secondary | ICD-10-CM

## 2022-07-18 MED ORDER — MIRABEGRON ER 50 MG PO TB24: 50.0000 mg | ORAL_TABLET | Freq: Every day | ORAL | 0 refills | Status: DC

## 2022-07-18 MED ORDER — DULOXETINE HCL 30 MG PO CPEP: 60.0000 mg | ORAL_CAPSULE | Freq: Every day | ORAL | 3 refills | Status: DC

## 2022-07-18 NOTE — Patient Instructions (Signed)
Preventive Care 65 Years and Older, Female Preventive care refers to lifestyle choices and visits with your health care provider that can promote health and wellness. Preventive care visits are also called wellness exams. What can I expect for my preventive care visit? Counseling Your health care provider may ask you questions about your: Medical history, including: Past medical problems. Family medical history. Pregnancy and menstrual history. History of falls. Current health, including: Memory and ability to understand (cognition). Emotional well-being. Home life and relationship well-being. Sexual activity and sexual health. Lifestyle, including: Alcohol, nicotine or tobacco, and drug use. Access to firearms. Diet, exercise, and sleep habits. Work and work environment. Sunscreen use. Safety issues such as seatbelt and bike helmet use. Physical exam Your health care provider will check your: Height and weight. These may be used to calculate your BMI (body mass index). BMI is a measurement that tells if you are at a healthy weight. Waist circumference. This measures the distance around your waistline. This measurement also tells if you are at a healthy weight and may help predict your risk of certain diseases, such as type 2 diabetes and high blood pressure. Heart rate and blood pressure. Body temperature. Skin for abnormal spots. What immunizations do I need?  Vaccines are usually given at various ages, according to a schedule. Your health care provider will recommend vaccines for you based on your age, medical history, and lifestyle or other factors, such as travel or where you work. What tests do I need? Screening Your health care provider may recommend screening tests for certain conditions. This may include: Lipid and cholesterol levels. Hepatitis C test. Hepatitis B test. HIV (human immunodeficiency virus) test. STI (sexually transmitted infection) testing, if you are at  risk. Lung cancer screening. Colorectal cancer screening. Diabetes screening. This is done by checking your blood sugar (glucose) after you have not eaten for a while (fasting). Mammogram. Talk with your health care provider about how often you should have regular mammograms. BRCA-related cancer screening. This may be done if you have a family history of breast, ovarian, tubal, or peritoneal cancers. Bone density scan. This is done to screen for osteoporosis. Talk with your health care provider about your test results, treatment options, and if necessary, the need for more tests. Follow these instructions at home: Eating and drinking  Eat a diet that includes fresh fruits and vegetables, whole grains, lean protein, and low-fat dairy products. Limit your intake of foods with high amounts of sugar, saturated fats, and salt. Take vitamin and mineral supplements as recommended by your health care provider. Do not drink alcohol if your health care provider tells you not to drink. If you drink alcohol: Limit how much you have to 0-1 drink a day. Know how much alcohol is in your drink. In the [place] one drink equals one 12 oz bottle of beer (355 mL), one 5 oz glass of wine (148 mL), or one 1 oz glass of hard liquor (44 mL). Lifestyle Brush your teeth every morning and night with fluoride toothpaste. Floss one time each day. Exercise for at least 30 minutes 5 or more days each week. Do not use any products that contain nicotine or tobacco. These products include cigarettes, chewing tobacco, and vaping devices, such as e-cigarettes. If you need help quitting, ask your health care provider. Do not use drugs. If you are sexually active, practice safe sex. Use a condom or other form of protection in order to prevent STIs. Take aspirin only as told by   your health care provider. Make sure that you understand how much to take and what form to take. Work with your health care provider to find out whether it  is safe and beneficial for you to take aspirin daily. Ask your health care provider if you need to take a cholesterol-lowering medicine (statin). Find healthy ways to manage stress, such as: Meditation, yoga, or listening to music. Journaling. Talking to a trusted person. Spending time with friends and family. Minimize exposure to UV radiation to reduce your risk of skin cancer. Safety Always wear your seat belt while driving or riding in a vehicle. Do not drive: If you have been drinking alcohol. Do not ride with someone who has been drinking. When you are tired or distracted. While texting. If you have been using any mind-altering substances or drugs. Wear a helmet and other protective equipment during sports activities. If you have firearms in your house, make sure you follow all gun safety procedures. What's next? Visit your health care provider once a year for an annual wellness visit. Ask your health care provider how often you should have your eyes and teeth checked. Stay up to date on all vaccines. This information is not intended to replace advice given to you by your health care provider. Make sure you discuss any questions you have with your health care provider. Document Revised: 01/25/2021 Document Reviewed: 01/25/2021 Elsevier Patient Education  2023 [employer]  

## 2022-07-18 NOTE — Progress Notes (Signed)
I have separately seen and examined the patient. I have discussed the findings and exam with student Dr Jerline Pain and agree with the below note.  My changes/additions are outlined in BLUE.    S: Patient reports that she has been under more stress lately.  She subsequently has had some fatigue.  Not sure if this is due to an undiagnosed sleep apnea due to weight gain or what.  She had several sleep apnea test but has really never been formally diagnosed nor started on CPAP machine.  She take Cymbalta 30 mg daily.  Willing to go up to 60 mg at this would be helpful.  Having some left-sided knee pain that was improved by the injection last time and she would like to get this set up again.  Her husband is having a lot of medical issues and there is concern for possible dementia.  This worries her.  She continues to suffer from urinary incontinence and has been on Myrbetriq, [name] (which worked really well but was too expensive) and now on oxybutynin.  The oxybutynin tends to dry her out quite a bit so she would like to go back to one of the other ones after the first of the year when her insurance changes but would be glad to start on a sample for the next month if we have it.  O: Vitals:   07/18/22 0923  BP: 132/74  Pulse: 79  Temp: 98.2 F (36.8 C)  SpO2: 95%    General appearance: alert, cooperative, appears stated age, and no distress Head: Normocephalic, without obvious abnormality, atraumatic Eyes: negative findings: lids and lashes normal, conjunctivae and sclerae normal, corneas clear, and pupils equal, round, reactive to light and accomodation Ears: normal TM's and external ear canals both ears Nose: Nares normal. Septum midline. Mucosa normal. No drainage or sinus tenderness. Throat: lips, mucosa, and tongue normal; teeth and gums normal Neck: no adenopathy, supple, symmetrical, trachea midline, and thyroid not enlarged, symmetric, no tenderness/mass/nodules Back: symmetric, no  curvature. ROM normal. No CVA tenderness. Lungs: clear to auscultation bilaterally Heart: regular rate and rhythm, S1, S2 normal, no murmur, click, rub or gallop Abdomen: soft, non-tender; bowel sounds normal; no masses,  no organomegaly and exam is limited by obesity Extremities: extremities normal, atraumatic, no cyanosis or edema; she has hammertoe owing in digits 2 and 3 on the left Pulses: 2+ and symmetric Skin: Skin color, texture, turgor normal. No rashes or lesions Lymph nodes: Cervical, supraclavicular, and axillary nodes normal. Neurologic: Grossly normal Psych: Appears stressed MSK: Gait slightly antalgic due to left knee pain.  Ambulating independently however     07/18/2022    9:35 AM 07/19/2021   10:35 AM 05/12/2021   12:50 PM  Depression screen PHQ 2/9  Decreased Interest 0 0 2  Down, Depressed, Hopeless 0 1 1  PHQ - 2 Score 0 1 3  Altered sleeping  1 1  Tired, decreased energy  1 3  Change in appetite  2 2  Feeling bad or failure about yourself   0 0  Trouble concentrating  1 1  Moving slowly or fidgety/restless  2 0  Suicidal thoughts  0 0  PHQ-9 Score  8 10  Difficult doing work/chores  Somewhat difficult Somewhat difficult      07/18/2022    9:35 AM 07/19/2021   10:35 AM 03/22/2021    1:54 PM  GAD 7 : Generalized Anxiety Score  Nervous, Anxious, on Edge 0 1 2  Control/stop worrying 0  1 1  Worry too much - different things 0 1 1  Trouble relaxing 0 1 1  Restless 0 0 0  Easily annoyed or irritable 0 1 0  Afraid - awful might happen 0 0 0  Total GAD 7 Score 0 5 5  Anxiety Difficulty Not difficult at all Somewhat difficult Very difficult    A/P:  Annual physical exam  Acquired hypothyroidism - Plan: CMP14+EGFR, TSH, T4, Free  Essential hypertension, benign - Plan: CMP14+EGFR  Aortic atherosclerosis (HCC) - Plan: CMP14+EGFR, Lipid Panel  Mixed hyperlipidemia - Plan: CMP14+EGFR, Lipid Panel  Statin myopathy  Endometrial cancer ([place]) - Plan: CBC, DG  [facility] DEXA  Need for immunization against influenza - Plan: Flu Vaccine QUAD High Dose(Fluad)  Stress due to illness of family member - Plan: DULoxetine (CYMBALTA) 30 MG capsule  Overactive bladder - Plan: mirabegron ER (MYRBETRIQ) 50 MG TB24 tablet  Check thyroid levels, lipid panel, CMP.  She has history of statin myopathy and therefore is not on a statin.  We could consider something like Zetia however for treatment of cholesterol.  Blood pressure is controlled.  No changes.  Continue to follow-up with specialist for endometrial cancer.  DEXA scan ordered.  Influenza vaccination administered  Cymbalta increased to 60 mg daily.  She will contact me after the first of the year to send a new Rx to her new pharmacy  With regards to her overactive bladder, she is on 5 mg of oxybutynin twice daily today.  She can discontinue this and I have given her a month supply of Myrbetriq 50 mg to trial since the 25 mg were really not helpful previously.  She will let me know if she wants to proceed with Myrbetriq prescription after the new year versus going back to [employer]. Lajuana Ripple, DO [facility]   -------------------------------------------------------------------------------------------------------------------------------------------------------------------------------------      Stacie Russell is a 71 y.o. female presents to office today for annual physical exam examination.    Concerns today include:  Hypertension  Hyperlipidemia The patient overall reports increased stress at home due to her husband recently being in the hospital for a GI bleed, as well as going through declining memory changes. She mentions that she has not been as active as she was previously and has been overeating, especially at night through snacking. She checks her blood pressures at home and reports that these are usually in the 130s over 170s range. She does consume fruits and  vegetables. The patient continues to be compliant with her medications, including furosemide and losartan-hydrochlorothiazide. She does not report chest pain or shortness of breath but notes some mild lower extremity edema on the left side.  Endometrial Cancer  Urinary Incontinence The patient sees a gynecologic oncologist for the management of endometrial cancer, diagnosed in 2022. She is post-total hysterectomy and bilateral salpingo-oophorectomy. She completed adjuvant chemotherapy in October 2022. Post-treatment imaging shows no evidence of disease. No red or pink discharge is noted, and there is no pelvic pain, fevers, or dysuria. The patient reports that her urinary incontinence has worsened over the past few months. She now goes through about 5-6 fully saturated pads per day and experiences significant leakage at night. She is currently on oxybutynin and has previously tried [employer] and [place]. The patient mentions that [name] worked well when she received free samples but is not adequately covered by insurance.  Knee Pain The patient reports ongoing knee pain that significantly subsided with a joint injection about six  months ago. She mentions that the injection was so successful that she was able to stave off surgery. However, her pain has returned over the past 2 weeks and is worse with walking. She would like another knee injection.   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, Exercise: Minimal  Last colonoscopy: UTD Last mammogram: UTD Last pap smear: Not needed Refills needed today: None  Immunizations needed: Immunization History  Administered Date(s) Administered   Fluad Quad(high Dose 65+) 06/16/2019, 08/31/2020, 05/19/2021   Influenza Whole 07/25/2012   Influenza, High Dose Seasonal PF 06/11/2017, 06/25/2018   Influenza,inj,Quad PF,6+ Mos 05/06/2013, 06/25/2014, 06/20/2015   Moderna SARS-COV2 Booster Vaccination 08/31/2020    Moderna Sars-Covid-2 Vaccination 09/14/2019, 10/05/2019   Pneumococcal Conjugate-13 05/06/2013   Pneumococcal Polysaccharide-23 09/05/2016   Tdap 04/26/2011   Zoster, Live 04/26/2011    Past Medical History:  Diagnosis Date   Arthritis    Asthma    Diverticulosis    Dyspnea    with exertion    Endometrial hyperplasia    Esophageal stricture    Family history of breast cancer 01/26/2021   Foot pain    Fundic gland polyps of stomach, benign 07/10/2015   Gastritis    GERD (gastroesophageal reflux disease)    Hemorrhoid    Hiatal hernia    Hip pain    History of kidney stones    cystoscopy   History of surgery on arm    left humerus   Hyperlipidemia    Hypertension    Hypothyroidism    Knee pain    Metabolic syndrome    Obstructive sleep apnea 08/27/2017   pt denies    Oxygen deficiency    pt denies   PONV (postoperative nausea and vomiting)    Spinal headache    few headaches recently    Tendonitis of ankle    Vitamin D deficiency    Social History   Socioeconomic History   Marital status: Married    Spouse name: Marcello Moores   Number of children: 2   Years of education: 14   Highest education level: Associate degree: academic program  Occupational History   Occupation: Retired  Tobacco Use   Smoking status: Never   Smokeless tobacco: Never  Vaping Use   Vaping Use: Never used  Substance and Sexual Activity   Alcohol use: No   Drug use: No   Sexual activity: Not Currently    Birth control/protection: Post-menopausal  Other Topics Concern   Not on file  Social History Narrative   Married and retired   No EtOH, tobacco or drug use   Social Determinants of Health   Financial Resource Strain: Low Risk  (05/12/2021)   Overall Financial Resource Strain (CARDIA)    Difficulty of Paying Living Expenses: Not hard at all  Food Insecurity: No Food Insecurity (05/12/2021)   Hunger Vital Sign    Worried About Running Out of Food in the Last Year: Never true    Ran  Out of Food in the Last Year: Never true  Transportation Needs: No Transportation Needs (05/12/2021)   PRAPARE - Hydrologist (Medical): No    Lack of Transportation (Non-Medical): No  Physical Activity: Inactive (05/12/2021)   Exercise Vital Sign    Days of Exercise per Week: 0 days    Minutes of Exercise per Session: 0 min  Stress: Stress Concern Present (05/12/2021)   [place]  Feeling of Stress : To some extent  Social Connections: Socially Integrated (05/12/2021)   Social Connection and Isolation Panel [NHANES]    Frequency of Communication with Friends and Family: More than three times a week    Frequency of Social Gatherings with Friends and Family: More than three times a week    Attends Religious Services: More than 4 times per year    Active Member of Clubs or Organizations: Yes    Attends Archivist Meetings: 1 to 4 times per year    Marital Status: Married  Human resources officer Violence: Not At Risk (05/12/2021)   Humiliation, Afraid, Rape, and Kick questionnaire    Fear of Current or Ex-Partner: No    Emotionally Abused: No    Physically Abused: No    Sexually Abused: No   Past Surgical History:  Procedure Laterality Date   ANAL FISSURE REPAIR     CATARACT EXTRACTION  May 2009   COLONOSCOPY  12/26/07   ESOPHAGEAL DILATION     ESOPHAGOGASTRODUODENOSCOPY  12/26/07, 05/13/08   EYE SURGERY Bilateral    bilateral cataract with lens implants   IR IMAGING GUIDED PORT INSERTION  01/25/2021   KNEE CARTILAGE SURGERY Right Sept 1999   ORIF HUMERUS FRACTURE Left 01/12/2020   Procedure: OPEN REDUCTION INTERNAL FIXATION (ORIF) LEFT HUMERUS FRACTURE;  Surgeon: Roseanne Kaufman, MD;  Location: [facility];  Service: Orthopedics;  Laterality: Left;   ROBOTIC ASSISTED TOTAL HYSTERECTOMY WITH BILATERAL SALPINGO OOPHERECTOMY Bilateral 12/29/2020   Procedure: XI ROBOTIC ASSISTED TOTAL HYSTERECTOMY  WITH BILATERAL SALPINGO OOPHORECTOMY;  Surgeon: Everitt Amber, MD;  Location: [facility];  Service: Gynecology;  Laterality: Bilateral;   SENTINEL NODE BIOPSY N/A 12/29/2020   Procedure: SENTINEL NODE BIOPSY;  Surgeon: Everitt Amber, MD;  Location: [facility];  Service: Gynecology;  Laterality: N/A;   TOTAL KNEE ARTHROPLASTY Right 10/07/2017   Procedure: RIGHT TOTAL KNEE ARTHROPLASTY;  Surgeon: Gaynelle Arabian, MD;  Location: [facility];  Service: Orthopedics;  Laterality: Right;  Adductor Block   UMBILICAL HERNIA REPAIR  05/20/06   Family History  Problem Relation Age of Onset   Colon cancer Mother 30   Heart attack Father    Heart disease Sister    Breast cancer Sister 72   Breast cancer Sister        dx 56s   Cancer Maternal Aunt 39       "female cancer", unknown type   Cancer Maternal Grandmother        unknown type; dx 62s   Cancer Maternal Grandfather        unknown type; dx 77s   Cancer Cousin        maternal female cousin; dx 50s; unknown type   Cancer Cousin        maternal female cousin; dx 73s; unknown type   Cancer Cousin        maternal female cousin; dx after 23; unknown type   Esophageal cancer Neg Hx    Stomach cancer Neg Hx    Rectal cancer Neg Hx     Current Outpatient Medications:    acetaminophen (TYLENOL) 500 MG tablet, Take 1,000 mg by mouth as needed (pain.)., Disp: , Rfl:    albuterol (VENTOLIN HFA) 108 (90 Base) MCG/ACT inhaler, Inhale 2 puffs into the lungs every 6 (six) hours as needed for wheezing or shortness of breath. (Patient taking differently: Inhale 2 puffs into the lungs as needed for wheezing or shortness of breath. PRN), Disp: 6.7 g, Rfl: 1  budesonide-formoterol (SYMBICORT) 80-4.5 MCG/ACT inhaler, Inhale 2 puffs into the lungs 2 (two) times daily. (Patient taking differently: Inhale 2 puffs into the lungs 2 (two) times daily as needed.), Disp: 3 each, Rfl: 11   Calcium Carbonate-Vit D-Min (CALCIUM 1200 PO), Take 1 tablet by mouth in the morning., Disp: , Rfl:     Cholecalciferol (VITAMIN D3) 2000 units TABS, Take 2,000 Units by mouth in the morning., Disp: , Rfl:    clobetasol ointment (TEMOVATE) 2.67 %, Apply 1 Application topically 3 (three) times a week. Apply cream to the area where you have had itching 3 times a week at night., Disp: 30 g, Rfl: 0   furosemide (LASIX) 20 MG tablet, TAKE 1 TABLET BY MOUTH DAILY AS  NEEDED FOR FLUID, Disp: 100 tablet, Rfl: 0   levothyroxine (SYNTHROID) 50 MCG tablet, Take 1 tablet (50 mcg total) by mouth daily before breakfast., Disp: 90 tablet, Rfl: 3   lidocaine-prilocaine (EMLA) cream, Apply to affected area once, Disp: 30 g, Rfl: 3   losartan-hydrochlorothiazide (HYZAAR) 100-12.5 MG tablet, Take 1 tablet by mouth daily., Disp: 100 tablet, Rfl: 3   mirabegron ER (MYRBETRIQ) 50 MG TB24 tablet, Take 1 tablet (50 mg total) by mouth daily., Disp: 30 tablet, Rfl: 0   Omega-3 Fatty Acids (FISH OIL) 1000 MG CAPS, Take 1,000 mg by mouth in the morning., Disp: , Rfl:    oxybutynin (DITROPAN) 5 MG tablet, TAKE 2 TABLETS BY MOUTH TWICE  DAILY, Disp: 400 tablet, Rfl: 0   pantoprazole (PROTONIX) 40 MG tablet, TAKE 1 TABLET BY MOUTH DAILY, Disp: 100 tablet, Rfl: 0   valACYclovir (VALTREX) 1000 MG tablet, TAKE 1 TABLET BY MOUTH TWICE  DAILY FOR 7 DAYS; IF HAVING  ANOTHER FLARE, TAKE 2 TABLETS  TWICE DAILY FOR 1 DAY ONLY PER  FLAREUP, Disp: 100 tablet, Rfl: 0   vitamin B-12 (CYANOCOBALAMIN) 1000 MCG tablet, Take 1,000 mcg by mouth in the morning., Disp: , Rfl:    DULoxetine (CYMBALTA) 30 MG capsule, Take 2 capsules (60 mg total) by mouth daily., Disp: 90 capsule, Rfl: 3  Allergies  Allergen Reactions   Tramadol Other (See Comments)    Unsure of reaction    Review of Systems  Review of Systems  Constitutional:  Negative for activity change, appetite change, chills, diaphoresis, fatigue, fever and unexpected weight change. Does report occasional headaches that last for one minute.  HENT:  Negative for congestion, ear pain, facial  swelling, sneezing and sore throat.   Respiratory:  Negative for cough, choking, chest tightness and shortness of breath. Reports occasional chest discomfort with acid reflux. Cardiovascular:  Negative for chest pain, palpitations and leg swelling.  Gastrointestinal:  Negative for blood in stool, constipation, diarrhea and nausea.  Endocrine: Negative for cold intolerance and heat intolerance.  Genitourinary:  Positive for frequency and urgency. Negative for dysuria, flank pain and hematuria.  Musculoskeletal:  Negative for arthralgias and back pain. Positive for knee pain.  Skin:  Negative for color change and pallor.  Neurological:  Negative for dizziness, numbness and headaches.   Physical exam Physical Exam Physical Examination:  General: Awake, alert, well nourished, No acute distress HEENT: Normal    Neck: No masses palpated. No lymphadenopathy    Ears: Tympanic membranes intact, normal light reflex, no erythema, no bulging    Eyes: PERRLA, extraocular membranes intact, sclera white    Nose: nasal turbinates moist, no nasal discharge    Throat: moist mucus membranes, no erythema, no tonsillar exudate.  Airway is  patent  Cardio: regular rate and rhythm, S1S2 heard, no murmurs appreciated Pulm: clear to auscultation bilaterally, no wheezes, rhonchi or rales; normal work of breathing on room air Extremities: warm, well perfused, No edema, cyanosis or clubbing; +1 pulses bilaterally. Very mild swelling and edema on the left side lower extremity.  Skin: dry; intact; no rashes or lesions  Assessment/ Plan: Stacie Russell here for annual physical exam.   It appears that the patient's urinary symptoms are worsening significantly. Additionally, she reports some side effects such as dry mouth with oxybutynin. Given patient's age and the worsening of her symptoms, I would like to try Myrbetriq. Discussed with the patient that we can submit a prescription for Myrbetriq and see if her  insurance will cover it in the new year, considering her upcoming switch in insurances. We will provide a sample of Myrbetriq to see if there is an improvement in the patient's symptoms.  I expect that some of the headaches that the patient is experiencing are secondary to her increased weight gain during this stressful time, as well as due to increased stress. Discussed with the patient that we can increase her duloxetine given the increased stress at home. The patient is amenable to this plan. We will increase it to 60 mg daily from 30 mg.   Additionally, we will plan for a knee injection at a separate visit, as the patient has done quite well with these injections in the past and her symptoms have recurred.   Counseled on healthy lifestyle choices, including diet (rich in fruits, vegetables and lean meats and low in salt and simple carbohydrates) and exercise (at least 30 minutes of moderate physical activity daily).  Patient to follow up in 1 year for annual exam or sooner if needed.  Stephani Police, MS3

## 2022-07-30 ENCOUNTER — Encounter: Payer: Self-pay | Admitting: Family Medicine

## 2022-08-05 ENCOUNTER — Other Ambulatory Visit: Payer: Self-pay | Admitting: Family Medicine

## 2022-08-07 ENCOUNTER — Ambulatory Visit (INDEPENDENT_AMBULATORY_CARE_PROVIDER_SITE_OTHER): Payer: Medicare Other | Admitting: Family Medicine

## 2022-08-07 ENCOUNTER — Encounter: Payer: Self-pay | Admitting: Family Medicine

## 2022-08-07 ENCOUNTER — Ambulatory Visit (INDEPENDENT_AMBULATORY_CARE_PROVIDER_SITE_OTHER): Payer: Medicare Other

## 2022-08-07 VITALS — BP 133/71 | HR 73 | Temp 98.4°F | Ht 61.0 in | Wt 235.6 lb

## 2022-08-07 DIAGNOSIS — M25562 Pain in left knee: Secondary | ICD-10-CM

## 2022-08-07 DIAGNOSIS — Z780 Asymptomatic menopausal state: Secondary | ICD-10-CM

## 2022-08-07 DIAGNOSIS — G8929 Other chronic pain: Secondary | ICD-10-CM

## 2022-08-07 DIAGNOSIS — M19041 Primary osteoarthritis, right hand: Secondary | ICD-10-CM

## 2022-08-07 DIAGNOSIS — M85852 Other specified disorders of bone density and structure, left thigh: Secondary | ICD-10-CM | POA: Diagnosis not present

## 2022-08-07 DIAGNOSIS — M85832 Other specified disorders of bone density and structure, left forearm: Secondary | ICD-10-CM | POA: Diagnosis not present

## 2022-08-07 DIAGNOSIS — M85851 Other specified disorders of bone density and structure, right thigh: Secondary | ICD-10-CM | POA: Diagnosis not present

## 2022-08-07 NOTE — Progress Notes (Signed)
Subjective: CC: Knee injection? PCP: Janora Norlander, DO WLN:[id number] Stacie Russell is a 71 y.o. female presenting to clinic today for:  1.  Chronic left knee pain Patient reports that her knee pain actually is doing well so she does not feel like she needs an injection but she did want to talk about the arthritis in her hand.  She is right-hand dominant and has some pain along the right pointer finger.  She has been baking a lot and thinks that this is likely what exacerbated it.  Occasionally uses Tylenol but not currently utilizing any other medications.   ROS: Per HPI  Allergies  Allergen Reactions   Tramadol Other (See Comments)    Unsure of reaction   Past Medical History:  Diagnosis Date   Arthritis    Asthma    Diverticulosis    Dyspnea    with exertion    Endometrial hyperplasia    Esophageal stricture    Family history of breast cancer 01/26/2021   Foot pain    Fundic gland polyps of stomach, benign 07/10/2015   Gastritis    GERD (gastroesophageal reflux disease)    Hemorrhoid    Hiatal hernia    Hip pain    History of kidney stones    cystoscopy   History of surgery on arm    left humerus   Hyperlipidemia    Hypertension    Hypothyroidism    Knee pain    Metabolic syndrome    Obstructive sleep apnea 08/27/2017   pt denies    Oxygen deficiency    pt denies   PONV (postoperative nausea and vomiting)    Spinal headache    few headaches recently    Tendonitis of ankle    Vitamin D deficiency     Current Outpatient Medications:    acetaminophen (TYLENOL) 500 MG tablet, Take 1,000 mg by mouth as needed (pain.)., Disp: , Rfl:    albuterol (VENTOLIN HFA) 108 (90 Base) MCG/ACT inhaler, Inhale 2 puffs into the lungs every 6 (six) hours as needed for wheezing or shortness of breath. (Patient taking differently: Inhale 2 puffs into the lungs as needed for wheezing or shortness of breath. PRN), Disp: 6.7 g, Rfl: 1   budesonide-formoterol (SYMBICORT) 80-4.5  MCG/ACT inhaler, Inhale 2 puffs into the lungs 2 (two) times daily. (Patient taking differently: Inhale 2 puffs into the lungs 2 (two) times daily as needed.), Disp: 3 each, Rfl: 11   Calcium Carbonate-Vit D-Min (CALCIUM 1200 PO), Take 1 tablet by mouth in the morning., Disp: , Rfl:    Cholecalciferol (VITAMIN D3) 2000 units TABS, Take 2,000 Units by mouth in the morning., Disp: , Rfl:    clobetasol ointment (TEMOVATE) 9.41 %, Apply 1 Application topically 3 (three) times a week. Apply cream to the area where you have had itching 3 times a week at night., Disp: 30 g, Rfl: 0   DULoxetine (CYMBALTA) 30 MG capsule, Take 2 capsules (60 mg total) by mouth daily., Disp: 90 capsule, Rfl: 3   furosemide (LASIX) 20 MG tablet, TAKE 1 TABLET BY MOUTH DAILY AS  NEEDED FOR FLUID, Disp: 100 tablet, Rfl: 0   levothyroxine (SYNTHROID) 50 MCG tablet, Take 1 tablet (50 mcg total) by mouth daily before breakfast., Disp: 90 tablet, Rfl: 3   lidocaine-prilocaine (EMLA) cream, Apply to affected area once, Disp: 30 g, Rfl: 3   losartan-hydrochlorothiazide (HYZAAR) 100-12.5 MG tablet, Take 1 tablet by mouth daily., Disp: 100 tablet, Rfl: 3  mirabegron ER (MYRBETRIQ) 50 MG TB24 tablet, Take 1 tablet (50 mg total) by mouth daily., Disp: 30 tablet, Rfl: 0   Omega-3 Fatty Acids (FISH OIL) 1000 MG CAPS, Take 1,000 mg by mouth in the morning., Disp: , Rfl:    oxybutynin (DITROPAN) 5 MG tablet, TAKE 2 TABLETS BY MOUTH TWICE  DAILY, Disp: 400 tablet, Rfl: 0   pantoprazole (PROTONIX) 40 MG tablet, TAKE 1 TABLET BY MOUTH DAILY, Disp: 100 tablet, Rfl: 2   valACYclovir (VALTREX) 1000 MG tablet, TAKE 1 TABLET BY MOUTH TWICE  DAILY FOR 7 DAYS; IF HAVING  ANOTHER FLARE, TAKE 2 TABLETS  TWICE DAILY FOR 1 DAY ONLY PER  FLAREUP, Disp: 100 tablet, Rfl: 0   vitamin B-12 (CYANOCOBALAMIN) 1000 MCG tablet, Take 1,000 mcg by mouth in the morning., Disp: , Rfl:  Social History   Socioeconomic History   Marital status: Married    Spouse name:  Marcello Moores   Number of children: 2   Years of education: 14   Highest education level: Associate degree: academic program  Occupational History   Occupation: Retired  Tobacco Use   Smoking status: Never   Smokeless tobacco: Never  Vaping Use   Vaping Use: Never used  Substance and Sexual Activity   Alcohol use: No   Drug use: No   Sexual activity: Not Currently    Birth control/protection: Post-menopausal  Other Topics Concern   Not on file  Social History Narrative   Married and retired   No EtOH, tobacco or drug use   Social Determinants of Health   Financial Resource Strain: Low Risk  (05/12/2021)   Overall Financial Resource Strain (CARDIA)    Difficulty of Paying Living Expenses: Not hard at all  Food Insecurity: No Food Insecurity (05/12/2021)   Hunger Vital Sign    Worried About Running Out of Food in the Last Year: Never true    Ran Out of Food in the Last Year: Never true  Transportation Needs: No Transportation Needs (05/12/2021)   PRAPARE - Hydrologist (Medical): No    Lack of Transportation (Non-Medical): No  Physical Activity: Inactive (05/12/2021)   Exercise Vital Sign    Days of Exercise per Week: 0 days    Minutes of Exercise per Session: 0 min  Stress: Stress Concern Present (05/12/2021)   [place]    Feeling of Stress : To some extent  Social Connections: Socially Integrated (05/12/2021)   Social Connection and Isolation Panel [NHANES]    Frequency of Communication with Friends and Family: More than three times a week    Frequency of Social Gatherings with Friends and Family: More than three times a week    Attends Religious Services: More than 4 times per year    Active Member of [employer] or Organizations: Yes    Attends Archivist Meetings: 1 to 4 times per year    Marital Status: Married  Human resources officer Violence: Not At Risk (05/12/2021)   Humiliation,  Afraid, Rape, and Kick questionnaire    Fear of Current or Ex-Partner: No    Emotionally Abused: No    Physically Abused: No    Sexually Abused: No   Family History  Problem Relation Age of Onset   Colon cancer Mother 25   Heart attack Father    Heart disease Sister    Breast cancer Sister 3   Breast cancer Sister  dx 71s   Cancer Maternal Aunt 75       "female cancer", unknown type   Cancer Maternal Grandmother        unknown type; dx 95s   Cancer Maternal Grandfather        unknown type; dx 60s   Cancer Cousin        maternal female cousin; dx 39s; unknown type   Cancer Cousin        maternal female cousin; dx 66s; unknown type   Cancer Cousin        maternal female cousin; dx after 84; unknown type   Esophageal cancer Neg Hx    Stomach cancer Neg Hx    Rectal cancer Neg Hx     Objective: Office vital signs reviewed. BP 133/71   Pulse 73   Temp 98.4 F (36.9 C)   Ht '5\' 1"'$  (1.549 m)   Wt 235 lb 9.6 oz (106.9 kg)   SpO2 96%   BMI 44.52 kg/m   Physical Examination:  General: Awake, alert, morbidly obese, No acute distress MSK: Ambulating independently.  Right hand with some arthritic changes at the MCP of the second digit but no gross swelling, warmth or erythema.  She has full active range of motion but it is painful with flexion.  Assessment/ Plan: 71 y.o. female   Arthritis of finger of right hand  Chronic pain of left knee  Discussed use of topical Voltaren gel 2-3 times daily.  May use Tylenol arthritis.  If needed, glad to place referral to orthopedics for corticosteroid injection of that finger joint.  She did not need knee injection today as her symptoms were well-controlled currently.  May follow-up next year, sooner if concerns arise  No orders of the defined types were placed in this encounter.  No orders of the defined types were placed in this encounter.    Janora Norlander, DO [place] [phone]

## 2022-08-14 ENCOUNTER — Encounter: Payer: Self-pay | Admitting: Hematology and Oncology

## 2022-08-19 ENCOUNTER — Encounter: Payer: Self-pay | Admitting: Hematology and Oncology

## 2022-08-21 ENCOUNTER — Encounter: Payer: Self-pay | Admitting: Hematology and Oncology

## 2022-08-21 ENCOUNTER — Inpatient Hospital Stay (HOSPITAL_BASED_OUTPATIENT_CLINIC_OR_DEPARTMENT_OTHER): Payer: PPO | Admitting: Hematology and Oncology

## 2022-08-21 ENCOUNTER — Other Ambulatory Visit: Payer: Self-pay

## 2022-08-21 ENCOUNTER — Inpatient Hospital Stay: Payer: PPO | Attending: Gynecologic Oncology

## 2022-08-21 VITALS — BP 165/63 | HR 72 | Temp 97.5°F | Resp 18 | Ht 61.0 in | Wt 236.6 lb

## 2022-08-21 DIAGNOSIS — I700 Atherosclerosis of aorta: Secondary | ICD-10-CM | POA: Diagnosis not present

## 2022-08-21 DIAGNOSIS — Z79899 Other long term (current) drug therapy: Secondary | ICD-10-CM | POA: Diagnosis not present

## 2022-08-21 DIAGNOSIS — C541 Malignant neoplasm of endometrium: Secondary | ICD-10-CM

## 2022-08-21 DIAGNOSIS — Z885 Allergy status to narcotic agent status: Secondary | ICD-10-CM | POA: Diagnosis not present

## 2022-08-21 DIAGNOSIS — I2510 Atherosclerotic heart disease of native coronary artery without angina pectoris: Secondary | ICD-10-CM | POA: Insufficient documentation

## 2022-08-21 DIAGNOSIS — Z8542 Personal history of malignant neoplasm of other parts of uterus: Secondary | ICD-10-CM | POA: Diagnosis not present

## 2022-08-21 DIAGNOSIS — K760 Fatty (change of) liver, not elsewhere classified: Secondary | ICD-10-CM | POA: Diagnosis not present

## 2022-08-21 LAB — CMP (CANCER CENTER ONLY)
ALT: 14 U/L (ref 0–44)
AST: 13 U/L — ABNORMAL LOW (ref 15–41)
Albumin: 3.9 g/dL (ref 3.5–5.0)
Alkaline Phosphatase: 80 U/L (ref 38–126)
Anion gap: 5 (ref 5–15)
BUN: 19 mg/dL (ref 8–23)
CO2: 30 mmol/L (ref 22–32)
Calcium: 9.6 mg/dL (ref 8.9–10.3)
Chloride: 105 mmol/L (ref 98–111)
Creatinine: 0.75 mg/dL (ref 0.44–1.00)
GFR, Estimated: >60 mL/min (ref >60)
Glucose, Bld: 93 mg/dL (ref 70–99)
Potassium: 3.9 mmol/L (ref 3.5–5.1)
Sodium: 140 mmol/L (ref 135–145)
Total Bilirubin: 0.5 mg/dL (ref 0.3–1.2)
Total Protein: 6.5 g/dL (ref 6.5–8.1)

## 2022-08-21 LAB — CBC WITH DIFFERENTIAL (CANCER CENTER ONLY)
Abs Immature Granulocytes: 0.02 10*3/uL (ref 0.00–0.07)
Basophils Absolute: 0 10*3/uL (ref 0.0–0.1)
Basophils Relative: 0 %
Eosinophils Absolute: 0.2 10*3/uL (ref 0.0–0.5)
Eosinophils Relative: 4 %
HCT: 36.9 % (ref 36.0–46.0)
Hemoglobin: 12.7 g/dL (ref 12.0–15.0)
Immature Granulocytes: 0 %
Lymphocytes Relative: 30 %
Lymphs Abs: 1.6 10*3/uL (ref 0.7–4.0)
MCH: 32.3 pg (ref 26.0–34.0)
MCHC: 34.4 g/dL (ref 30.0–36.0)
MCV: 93.9 fL (ref 80.0–100.0)
Monocytes Absolute: 0.4 10*3/uL (ref 0.1–1.0)
Monocytes Relative: 7 %
Neutro Abs: 3.1 10*3/uL (ref 1.7–7.7)
Neutrophils Relative %: 59 %
Platelet Count: 193 10*3/uL (ref 150–400)
RBC: 3.93 MIL/uL (ref 3.87–5.11)
RDW: 12.6 % (ref 11.5–15.5)
WBC Count: 5.4 10*3/uL (ref 4.0–10.5)
nRBC: 0 % (ref 0.0–0.2)

## 2022-08-21 NOTE — Progress Notes (Signed)
[facility] OFFICE PROGRESS NOTE  Patient Care Team: Janora Norlander, DO as PCP - General (Family Medicine) Lorretta Harp, MD as PCP - Cardiology (Cardiology) Irine Seal, MD (Urology) Paula Compton, MD (Obstetrics and Gynecology) Gatha Mayer, MD (Gastroenterology) Gaynelle Arabian, MD as Consulting Physician (Orthopedic Surgery) Heath Lark, MD as Consulting Physician (Hematology and Oncology)  ASSESSMENT & PLAN:  Endometrial cancer [facility]) Clinically, she has no signs or symptoms to suggest cancer recurrence However, examination is limited due to central obesity Due to high risk disease, I plan to repeat CT imaging next week We will continue port maintenance Once we have reached 2 years cancer free interval, we will get her port removed  Orders Placed This Encounter  Procedures   CT ABDOMEN PELVIS W CONTRAST    Standing Status:   Future    Standing Expiration Date:   08/22/2023    Order Specific Question:   If indicated for the ordered procedure, I authorize the administration of contrast media per Radiology protocol    Answer:   Yes    Order Specific Question:   Preferred imaging location?    Answer:   [facility]    Order Specific Question:   Radiology Contrast Protocol - do NOT remove file path    Answer:   [email or web address]    All questions were answered. The patient knows to call the clinic with any problems, questions or concerns. The total time spent in the appointment was 20 minutes encounter with patients including review of chart and various tests results, discussions about plan of care and coordination of care plan   Heath Lark, MD 08/21/2022 10:53 AM  INTERVAL HISTORY: Please see below for problem oriented charting. she returns for surveillance follow-up She denies abdominal pain, bloating or changes in bowel habits No vaginal bleeding  REVIEW OF SYSTEMS:   Constitutional: Denies fevers,  chills or abnormal weight loss Eyes: Denies blurriness of vision Ears, nose, mouth, throat, and face: Denies mucositis or sore throat Respiratory: Denies cough, dyspnea or wheezes Cardiovascular: Denies palpitation, chest discomfort or lower extremity swelling Gastrointestinal:  Denies nausea, heartburn or change in bowel habits Skin: Denies abnormal skin rashes Lymphatics: Denies new lymphadenopathy or easy bruising Neurological:Denies numbness, tingling or new weaknesses Behavioral/Psych: Mood is stable, no new changes  All other systems were reviewed with the patient and are negative.  I have reviewed the past medical history, past surgical history, social history and family history with the patient and they are unchanged from previous note.  ALLERGIES:  is allergic to tramadol.  MEDICATIONS:  Current Outpatient Medications  Medication Sig Dispense Refill   acetaminophen (TYLENOL) 500 MG tablet Take 1,000 mg by mouth as needed (pain.).     albuterol (VENTOLIN HFA) 108 (90 Base) MCG/ACT inhaler Inhale 2 puffs into the lungs every 6 (six) hours as needed for wheezing or shortness of breath. (Patient taking differently: Inhale 2 puffs into the lungs as needed for wheezing or shortness of breath. PRN) 6.7 g 1   budesonide-formoterol (SYMBICORT) 80-4.5 MCG/ACT inhaler Inhale 2 puffs into the lungs 2 (two) times daily. (Patient taking differently: Inhale 2 puffs into the lungs 2 (two) times daily as needed.) 3 each 11   Calcium Carbonate-Vit D-Min (CALCIUM 1200 PO) Take 1 tablet by mouth in the morning.     Cholecalciferol (VITAMIN D3) 2000 units TABS Take 2,000 Units by mouth in the morning.     clobetasol ointment (TEMOVATE) 6.04 % Apply 1 Application topically  3 (three) times a week. Apply cream to the area where you have had itching 3 times a week at night. 30 g 0   DULoxetine (CYMBALTA) 30 MG capsule Take 2 capsules (60 mg total) by mouth daily. 90 capsule 3   furosemide (LASIX) 20 MG  tablet TAKE 1 TABLET BY MOUTH DAILY AS  NEEDED FOR FLUID 100 tablet 0   levothyroxine (SYNTHROID) 50 MCG tablet Take 1 tablet (50 mcg total) by mouth daily before breakfast. 90 tablet 3   lidocaine-prilocaine (EMLA) cream Apply to affected area once 30 g 3   losartan-hydrochlorothiazide (HYZAAR) 100-12.5 MG tablet Take 1 tablet by mouth daily. 100 tablet 3   mirabegron ER (MYRBETRIQ) 50 MG TB24 tablet Take 1 tablet (50 mg total) by mouth daily. 30 tablet 0   Omega-3 Fatty Acids (FISH OIL) 1000 MG CAPS Take 1,000 mg by mouth in the morning.     oxybutynin (DITROPAN) 5 MG tablet TAKE 2 TABLETS BY MOUTH TWICE  DAILY 400 tablet 0   pantoprazole (PROTONIX) 40 MG tablet TAKE 1 TABLET BY MOUTH DAILY 100 tablet 2   valACYclovir (VALTREX) 1000 MG tablet TAKE 1 TABLET BY MOUTH TWICE  DAILY FOR 7 DAYS; IF HAVING  ANOTHER FLARE, TAKE 2 TABLETS  TWICE DAILY FOR 1 DAY ONLY PER  FLAREUP 100 tablet 0   vitamin B-12 (CYANOCOBALAMIN) 1000 MCG tablet Take 1,000 mcg by mouth in the morning.     No current facility-administered medications for this visit.    SUMMARY OF ONCOLOGIC HISTORY: Oncology History Overview Note  MSI stable, endometrioid grade 2 Neg genetics   Endometrial cancer ([place])  12/19/2020 Initial Diagnosis   Endometrial cancer ([place])   12/26/2020 Imaging   1. Lower uterine segment/cervical mass without evidence of metastatic disease. 2. Hepatic steatosis. 3. Aortic atherosclerosis (ICD10-I70.0). Coronary artery calcification.   12/29/2020 Pathology Results   FINAL MICROSCOPIC DIAGNOSIS:   A. SENTINEL LYMPH NODE, RIGHT OBTURATOR, EXCISION:  -  No carcinoma identified in one lymph node (0/1)  -  See comment   B. SENTINEL LYMPH NODE, LEFT OBTURATOR, EXCISION:  -  Micrometastasis involving one lymph node (80m/1)  -  See comment   C. UTERUS, CERVIX, BILATERAL TUBES AND OVARIES:  Uterus:  -  Endometrioid carcinoma, FIGO grade 2  -  See oncology table and comment below   Cervix:  -  No  carcinoma identified   Bilateral Ovaries:  -  No carcinoma identified   Bilateral Fallopian tubes:  -  No carcinoma identified   ONCOLOGY TABLE:   UTERUS, CARCINOMA OR CARCINOSARCOMA: Resection   Procedure: Total hysterectomy and bilateral salpingo-oophorectomy  Histologic Type: Endometrioid carcinoma  Histologic Grade: FIGO grade 2  Myometrial Invasion:       Percentage of Myometrial Invasion: Estimated to be less than 50%  Uterine Serosa Involvement: Not identified  Cervical stromal Involvement: Not identified  Extent of involvement of other tissue/organs: Not identified  Peritoneal/Ascitic Fluid: Not submitted/unknown  Lymphovascular Invasion: Not identified  Regional Lymph Nodes:       Pelvic Lymph Nodes Examined:  2 Sentinel                                0 non-sentinel                                   2 total  Pelvic Lymph Nodes with Metastasis: 0                           Macrometastasis: (>2.0 mm): 0                          Micrometastasis: (>0.2 mm and < 2.0 mm): 1                            Isolated Tumor Cells (<0.2 mm): 0                       Laterality of Lymph Node with Tumor: 0                             Extracapsular Extension: N/A  Distant Metastasis:       Distant Site(s) Involved: N/A  Pathologic Stage Classification (pTNM, AJCC 8th Edition): pT1a, pN70m  Ancillary Studies: MMR / MSI testing will be ordered  Representative Tumor Block: C1  Comment(s): Cytokeratin AE1/3 was performed on the lymph nodes (parts a and B) and supports the presence of micrometastasis (part B only). Drs. PSaralyn Pilarand CNorthern Westchester Hospitalreviewed select parts of the case and agree with the above diagnosis.   (v4.2.0.1)     12/29/2020 Surgery   Surgeon: RDonaciano EvaPre-operative Diagnosis: endometrial cancer grade 3    Operation: Robotic-assisted laparoscopic total hysterectomy with bilateral salpingoophorectomy, SLN biopsy   Surgeon: RDonaciano Eva Specimens:  uterus, cervix, bilateral tubes and ovaries, right and left obturator SLN           01/22/2021 Cancer Staging   Staging form: Corpus Uteri - Carcinoma and Carcinosarcoma, AJCC 8th Edition - Pathologic stage from 01/22/2021: Stage IIIC1 (pT1a, pN130m cM0) - Signed by GoHeath LarkMD on 01/22/2021 Stage prefix: Initial diagnosis   01/25/2021 Procedure   Successful placement of a right IJ approach Power Port with ultrasound and fluoroscopic guidance. The catheter is ready for use.   02/07/2021 - 05/19/2021 Chemotherapy   Patient is on Treatment Plan : UTERINE Carboplatin AUC 6 / Paclitaxel q21d     02/11/2021 Genetic Testing   Negative hereditary cancer genetic testing: no pathogenic variants detected in [facility] Common Hereditary Cancers +RNA Panel.  The report date is February 11, 2021.    The Common Hereditary Cancers + RNA Panel offered by [facility] includes sequencing, deletion/duplication, and RNA testing of the following 47 genes: APC, ATM, AXIN2, BARD1, BMPR1A, BRCA1, BRCA2, BRIP1, CDH1, CDK4*, CDKN2A (p14ARF)*, CDKN2A (p16INK4a)*, CHEK2, CTNNA1, DICER1, EPCAM (Deletion/duplication testing only), GREM1 (promoter region deletion/duplication testing only), KIT, MEN1, MLH1, MSH2, MSH3, MSH6, MUTYH, NBN, NF1, NHTL1, PALB2, PDGFRA*, PMS2, POLD1, POLE, PTEN, RAD50, RAD51C, RAD51D, SDHB, SDHC, SDHD, SMAD4, SMARCA4. STK11, TP53, TSC1, TSC2, and VHL.  The following genes were evaluated for sequence changes only: SDHA and HOXB13 c.251G>A variant only.  RNA analysis is not performed for the * genes.     06/14/2021 Imaging   1. Interval hysterectomy and bilateral salpingo oophorectomy. No findings of residual/recurrent malignancy. 2. Substantial lumbar spondylosis and degenerative disc disease. In addition, a right eccentric intervertebral spur at T8-9 causes prominent central narrowing of the thecal sac. 3. Other imaging findings of potential clinical significance: Aortic Atherosclerosis (ICD10-I70.0). Systemic  atherosclerosis. Small type 1 hiatal hernia.       PHYSICAL EXAMINATION:  ECOG PERFORMANCE STATUS: 0 - Asymptomatic  Vitals:   08/21/22 1044  BP: (!) 165/63  Pulse: 72  Resp: 18  Temp: (!) 97.5 F (36.4 C)  SpO2: 100%   Filed Weights   08/21/22 1044  Weight: 236 lb 9.6 oz (107.3 kg)    GENERAL:alert, no distress and comfortable SKIN: skin color, texture, turgor are normal, no rashes or significant lesions EYES: normal, Conjunctiva are pink and non-injected, sclera clear OROPHARYNX:no exudate, no erythema and lips, buccal mucosa, and tongue normal  NECK: supple, thyroid normal size, non-tender, without nodularity LYMPH:  no palpable lymphadenopathy in the cervical, axillary or inguinal LUNGS: clear to auscultation and percussion with normal breathing effort HEART: regular rate & rhythm and no murmurs and no lower extremity edema ABDOMEN:abdomen soft, non-tender and normal bowel sounds Musculoskeletal:no cyanosis of digits and no clubbing  NEURO: alert & oriented x 3 with fluent speech, no focal motor/sensory deficits  LABORATORY DATA:  I have reviewed the data as listed    Component Value Date/Time   NA 140 02/06/2022 1024   NA 141 08/31/2020 1149   K 4.1 02/06/2022 1024   CL 108 02/06/2022 1024   CO2 31 02/06/2022 1024   GLUCOSE 94 02/06/2022 1024   BUN 20 02/06/2022 1024   BUN 15 08/31/2020 1149   CREATININE 0.65 02/06/2022 1024   CREATININE 0.60 11/03/2012 1228   CALCIUM 9.2 02/06/2022 1024   PROT 6.3 (L) 02/06/2022 1024   PROT 7.3 08/31/2020 1149   ALBUMIN 3.8 02/06/2022 1024   ALBUMIN 4.7 08/31/2020 1149   AST 12 (L) 02/06/2022 1024   ALT 13 02/06/2022 1024   ALKPHOS 75 02/06/2022 1024   BILITOT 0.3 02/06/2022 1024   GFRNONAA >60 02/06/2022 1024   GFRAA 91 08/31/2020 1149    No results found for: "SPEP", "UPEP"  Lab Results  Component Value Date   WBC 5.4 08/21/2022   NEUTROABS 3.1 08/21/2022   HGB 12.7 08/21/2022   HCT 36.9 08/21/2022   MCV  93.9 08/21/2022   PLT 193 08/21/2022      Chemistry      Component Value Date/Time   NA 140 02/06/2022 1024   NA 141 08/31/2020 1149   K 4.1 02/06/2022 1024   CL 108 02/06/2022 1024   CO2 31 02/06/2022 1024   BUN 20 02/06/2022 1024   BUN 15 08/31/2020 1149   CREATININE 0.65 02/06/2022 1024   CREATININE 0.60 11/03/2012 1228   GLU 81 07/24/2012 0000      Component Value Date/Time   CALCIUM 9.2 02/06/2022 1024   ALKPHOS 75 02/06/2022 1024   AST 12 (L) 02/06/2022 1024   ALT 13 02/06/2022 1024   BILITOT 0.3 02/06/2022 1024

## 2022-08-21 NOTE — Assessment & Plan Note (Signed)
Clinically, she has no signs or symptoms to suggest cancer recurrence However, examination is limited due to central obesity Due to high risk disease, I plan to repeat CT imaging next week We will continue port maintenance Once we have reached 2 years cancer free interval, we will get her port removed

## 2022-08-23 ENCOUNTER — Telehealth: Payer: Self-pay | Admitting: Oncology

## 2022-08-23 NOTE — Telephone Encounter (Signed)
Called Stacie Russell and reminded her to schedule her CT scan.  She said she is going to call now to schedule it.

## 2022-08-26 ENCOUNTER — Other Ambulatory Visit: Payer: Self-pay | Admitting: Family Medicine

## 2022-08-26 DIAGNOSIS — N3281 Overactive bladder: Secondary | ICD-10-CM

## 2022-08-28 ENCOUNTER — Ambulatory Visit (HOSPITAL_COMMUNITY)
Admission: RE | Admit: 2022-08-28 | Discharge: 2022-08-28 | Disposition: A | Discharge status: Home / Self Care | Payer: PPO | Source: Ambulatory Visit | Attending: Hematology and Oncology | Admitting: Hematology and Oncology

## 2022-08-28 DIAGNOSIS — R195 Other fecal abnormalities: Secondary | ICD-10-CM | POA: Diagnosis not present

## 2022-08-28 DIAGNOSIS — C541 Malignant neoplasm of endometrium: Secondary | ICD-10-CM | POA: Diagnosis not present

## 2022-08-28 MED ORDER — IOHEXOL 300 MG/ML  SOLN
100.0000 mL | Freq: Once | INTRAMUSCULAR | Status: AC | PRN
  Administered 2022-08-28: 100 mL via INTRAVENOUS

## 2022-08-28 MED ORDER — SODIUM CHLORIDE (PF) 0.9 % IJ SOLN
INTRAMUSCULAR | Status: AC
  Filled 2022-08-28: qty 50

## 2022-08-28 MED ORDER — IOHEXOL 9 MG/ML PO SOLN
1000.0000 mL | ORAL | Status: AC
  Administered 2022-08-28: 1000 mL via ORAL

## 2022-08-30 ENCOUNTER — Telehealth: Payer: Self-pay

## 2022-08-30 NOTE — Telephone Encounter (Signed)
Called and given below message. She verbalized understanding.

## 2022-08-30 NOTE — Telephone Encounter (Signed)
-----  Message from Heath Lark, MD sent at 08/30/2022  9:07 AM EST ----- Pls call her, CT is normal but suggest constipation. I recommend daily laxatives

## 2022-08-31 ENCOUNTER — Ambulatory Visit (HOSPITAL_COMMUNITY): Payer: PPO

## 2022-09-13 ENCOUNTER — Encounter: Payer: Self-pay | Admitting: Hematology and Oncology

## 2022-09-20 ENCOUNTER — Ambulatory Visit (INDEPENDENT_AMBULATORY_CARE_PROVIDER_SITE_OTHER): Payer: HMO | Admitting: Orthopaedic Surgery

## 2022-09-20 ENCOUNTER — Encounter: Payer: Self-pay | Admitting: Orthopaedic Surgery

## 2022-09-20 ENCOUNTER — Ambulatory Visit: Payer: Self-pay

## 2022-09-20 VITALS — Ht 61.0 in | Wt 236.0 lb

## 2022-09-20 DIAGNOSIS — M25551 Pain in right hip: Secondary | ICD-10-CM

## 2022-09-20 DIAGNOSIS — M25552 Pain in left hip: Secondary | ICD-10-CM | POA: Diagnosis not present

## 2022-09-20 DIAGNOSIS — M65321 Trigger finger, right index finger: Secondary | ICD-10-CM | POA: Diagnosis not present

## 2022-09-20 NOTE — Progress Notes (Signed)
Office Visit Note   Patient: Stacie Russell           Date of Birth: [birth year]           MRN: [id number] Visit Date: 09/20/2022              Requested by: Janora Norlander, DO [place],  [place] [id number] PCP: Janora Norlander, DO   Assessment & Plan: Visit Diagnoses:  1. Bilateral hip pain     Plan: Trigger finger injection performed which she tolerated well.  She will follow-up if she has ongoing problems.  Follow-Up Instructions: No follow-ups on file.   Orders:  Orders Placed This Encounter  Procedures   XR Lumbar Spine 2-3 Views   XR HIPS BILAT W OR W/O PELVIS 3-4 VIEWS   No orders of the defined types were placed in this encounter.     Procedures: Hand/UE Inj: R index A1 for trigger finger on 09/20/2022 10:11 AM Medications: 0.5 mL lidocaine 1 %; 0.5 mL bupivacaine 0.25 %; 20 mg methylPREDNISolone acetate 40 MG/ML      Clinical Data: No additional findings.   Subjective: Chief Complaint  Patient presents with   Lower Back - Pain   Right Hip - Pain   Left Hip - Pain    HPI 72 year old female not seen couple years with problems with pain in her hips radiating down to her knees.  History of endometrial cancer but principal problem is right index finger triggering and catching which she is able to demonstrate.  I previously treated her for right trigger thumb this is now the index finger.  She is requesting an injection which previously worked well for her thumb.  Review of Systems systems noncontributory.   Objective: Vital Signs: Ht 5' 1"$  (1.549 m)   Wt 236 lb (107 kg)   BMI 44.59 kg/m   Physical Exam Constitutional:      Appearance: She is well-developed.  HENT:     Head: Normocephalic.     Right Ear: External ear normal.     Left Ear: External ear normal. There is no impacted cerumen.  Eyes:     Pupils: Pupils are equal, round, and reactive to light.  Neck:     Thyroid: No thyromegaly.     Trachea: No tracheal  deviation.  Cardiovascular:     Rate and Rhythm: Normal rate.  Pulmonary:     Effort: Pulmonary effort is normal.  Abdominal:     Palpations: Abdomen is soft.  Musculoskeletal:     Cervical back: No rigidity.  Skin:    General: Skin is warm and dry.  Neurological:     Mental Status: She is alert and oriented to person, place, and time.  Psychiatric:        Behavior: Behavior normal.     Ortho Exam patient demonstrates triggering right index finger tenderness over the A1 pulley.  All of the digits no triggering.  Specialty Comments:  No specialty comments available.  Imaging: No results found.   PMFS History: Patient Active Problem List   Diagnosis Date Noted   Pancytopenia, acquired ([place]) 05/19/2021   Dysuria 03/06/2021   Arthralgia 02/28/2021   Other constipation 02/14/2021   Genetic testing 02/14/2021   Family history of breast cancer 01/26/2021   Pelvic floor dysfunction 01/23/2021   Endometrial cancer ([place]) 12/19/2020   Closed displaced comminuted fracture of shaft of left humerus 01/12/2020   Pain in left arm 01/04/2020   Synovitis  of left ankle 09/24/2019   Trigger thumb, right thumb 09/24/2019   Degeneration of lumbar intervertebral disc 10/24/2018   OA (osteoarthritis) of knee 10/07/2017   Severe obstructive sleep apnea 08/27/2017   Oxygen deficiency    Hypertension    Spinal stenosis of lumbar region 03/08/2017   Other fatigue 02/15/2017   Palpitations 01/16/2017   Fundic gland polyps of stomach, benign 07/10/2015   Fundic gland polyposis of stomach 07/10/2015   Family history of colon cancer in mother - dx 40's 07/04/2015   Overactive bladder 12/29/2014   Chest pain [id number]   Metabolic syndrome [id number]   Obesity, Class III, BMI 40-49.9 (morbid obesity) ([place]) 05/07/2013   Morbid obesity ([place]) 05/07/2013   Essential hypertension, benign 11/03/2012   Vitamin D deficiency 11/03/2012   Knee pain    Endometrial hyperplasia    Hemorrhoid    Foot  pain    Esophageal stricture    Diverticulosis    Hip pain    Gastritis    Tendonitis of ankle    Hiatal hernia    Mixed hyperlipidemia    Asthma    GERD 10/03/2009   Past Medical History:  Diagnosis Date   Arthritis    Asthma    Diverticulosis    Dyspnea    with exertion    Endometrial hyperplasia    Esophageal stricture    Family history of breast cancer 01/26/2021   Foot pain    Fundic gland polyps of stomach, benign 07/10/2015   Gastritis    GERD (gastroesophageal reflux disease)    Hemorrhoid    Hiatal hernia    Hip pain    History of kidney stones    cystoscopy   History of surgery on arm    left humerus   Hyperlipidemia    Hypertension    Hypothyroidism    Knee pain    Metabolic syndrome    Obstructive sleep apnea 08/27/2017   pt denies    Oxygen deficiency    pt denies   PONV (postoperative nausea and vomiting)    Spinal headache    few headaches recently    Tendonitis of ankle    Vitamin D deficiency     Family History  Problem Relation Age of Onset   Colon cancer Mother 78   Heart attack Father    Heart disease Sister    Breast cancer Sister 72   Breast cancer Sister        dx 72s   Cancer Maternal Aunt 45       "female cancer", unknown type   Cancer Maternal Grandmother        unknown type; dx 5s   Cancer Maternal Grandfather        unknown type; dx 25s   Cancer Cousin        maternal female cousin; dx 11s; unknown type   Cancer Cousin        maternal female cousin; dx 17s; unknown type   Cancer Cousin        maternal female cousin; dx after 55; unknown type   Esophageal cancer Neg Hx    Stomach cancer Neg Hx    Rectal cancer Neg Hx     Past Surgical History:  Procedure Laterality Date   ANAL FISSURE REPAIR     CATARACT EXTRACTION  May 2009   COLONOSCOPY  12/26/07   ESOPHAGEAL DILATION     ESOPHAGOGASTRODUODENOSCOPY  12/26/07, 05/13/08   EYE SURGERY Bilateral    bilateral cataract  with lens implants   IR IMAGING GUIDED PORT  INSERTION  01/25/2021   KNEE CARTILAGE SURGERY Right Sept 1999   ORIF HUMERUS FRACTURE Left 01/12/2020   Procedure: OPEN REDUCTION INTERNAL FIXATION (ORIF) LEFT HUMERUS FRACTURE;  Surgeon: Roseanne Kaufman, MD;  Location: [facility];  Service: Orthopedics;  Laterality: Left;   ROBOTIC ASSISTED TOTAL HYSTERECTOMY WITH BILATERAL SALPINGO OOPHERECTOMY Bilateral 12/29/2020   Procedure: XI ROBOTIC ASSISTED TOTAL HYSTERECTOMY WITH BILATERAL SALPINGO OOPHORECTOMY;  Surgeon: Everitt Amber, MD;  Location: [facility];  Service: Gynecology;  Laterality: Bilateral;   SENTINEL NODE BIOPSY N/A 12/29/2020   Procedure: SENTINEL NODE BIOPSY;  Surgeon: Everitt Amber, MD;  Location[facility];  Service: Gynecology;  Laterality: N/A;   TOTAL KNEE ARTHROPLASTY Right 10/07/2017   Procedure: RIGHT TOTAL KNEE ARTHROPLASTY;  Surgeon: Gaynelle Arabian, MD;  Location[facility];  Service: Orthopedics;  Laterality: Right;  Adductor Block   UMBILICAL HERNIA REPAIR  05/20/06   Social History   Occupational History   Occupation: Retired  Tobacco Use   Smoking status: Never   Smokeless tobacco: Never  Vaping Use   Vaping Use: Never used  Substance and Sexual Activity   Alcohol use: No   Drug use: No   Sexual activity: Not Currently    Birth control/protection: Post-menopausal

## 2022-09-22 MED ORDER — BUPIVACAINE HCL 0.25 % IJ SOLN
0.5000 mL | INTRAMUSCULAR | Status: AC | PRN
  Administered 2022-09-20: .5 mL

## 2022-09-22 MED ORDER — METHYLPREDNISOLONE ACETATE 40 MG/ML IJ SUSP
20.0000 mg | INTRAMUSCULAR | Status: AC | PRN
  Administered 2022-09-20: 20 mg

## 2022-09-22 MED ORDER — LIDOCAINE HCL 1 % IJ SOLN
0.5000 mL | INTRAMUSCULAR | Status: AC | PRN
  Administered 2022-09-20: .5 mL

## 2022-09-24 ENCOUNTER — Encounter: Payer: Self-pay | Admitting: Hematology and Oncology

## 2022-10-05 ENCOUNTER — Other Ambulatory Visit: Payer: Self-pay

## 2022-10-05 ENCOUNTER — Inpatient Hospital Stay: Payer: HMO | Attending: Gynecologic Oncology | Admitting: Gynecologic Oncology

## 2022-10-05 ENCOUNTER — Encounter: Payer: Self-pay | Admitting: Gynecologic Oncology

## 2022-10-05 VITALS — BP 157/57 | HR 75 | Temp 98.0°F | Resp 14 | Wt 239.0 lb

## 2022-10-05 DIAGNOSIS — Z90710 Acquired absence of both cervix and uterus: Secondary | ICD-10-CM | POA: Insufficient documentation

## 2022-10-05 DIAGNOSIS — Z90722 Acquired absence of ovaries, bilateral: Secondary | ICD-10-CM | POA: Diagnosis not present

## 2022-10-05 DIAGNOSIS — Z9221 Personal history of antineoplastic chemotherapy: Secondary | ICD-10-CM | POA: Insufficient documentation

## 2022-10-05 DIAGNOSIS — C541 Malignant neoplasm of endometrium: Secondary | ICD-10-CM

## 2022-10-05 DIAGNOSIS — Z8542 Personal history of malignant neoplasm of other parts of uterus: Secondary | ICD-10-CM

## 2022-10-05 NOTE — Patient Instructions (Signed)
It was good to see you today.  I do not see or feel any evidence of cancer recurrence on your exam.  I will see you for follow-up in 3 months.  As always, if you develop any new and concerning symptoms before your next visit, please call to see me sooner.

## 2022-10-05 NOTE — Progress Notes (Signed)
Gynecologic Oncology Return Clinic Visit  10/05/22  Reason for Visit: surveillance in the setting of high risk uterine cancer   Treatment History: Oncology History Overview Note  MSI stable, endometrioid grade 2 Neg genetics   Endometrial cancer ([place])  12/19/2020 Initial Diagnosis   Endometrial cancer ([place])   12/26/2020 Imaging   1. Lower uterine segment/cervical mass without evidence of metastatic disease. 2. Hepatic steatosis. 3. Aortic atherosclerosis (ICD10-I70.0). Coronary artery calcification.   12/29/2020 Pathology Results   FINAL MICROSCOPIC DIAGNOSIS:   A. SENTINEL LYMPH NODE, RIGHT OBTURATOR, EXCISION:  -  No carcinoma identified in one lymph node (0/1)  -  See comment   B. SENTINEL LYMPH NODE, LEFT OBTURATOR, EXCISION:  -  Micrometastasis involving one lymph node (14m/1)  -  See comment   C. UTERUS, CERVIX, BILATERAL TUBES AND OVARIES:  Uterus:  -  Endometrioid carcinoma, FIGO grade 2  -  See oncology table and comment below   Cervix:  -  No carcinoma identified   Bilateral Ovaries:  -  No carcinoma identified   Bilateral Fallopian tubes:  -  No carcinoma identified   ONCOLOGY TABLE:   UTERUS, CARCINOMA OR CARCINOSARCOMA: Resection   Procedure: Total hysterectomy and bilateral salpingo-oophorectomy  Histologic Type: Endometrioid carcinoma  Histologic Grade: FIGO grade 2  Myometrial Invasion:       Percentage of Myometrial Invasion: Estimated to be less than 50%  Uterine Serosa Involvement: Not identified  Cervical stromal Involvement: Not identified  Extent of involvement of other tissue/organs: Not identified  Peritoneal/Ascitic Fluid: Not submitted/unknown  Lymphovascular Invasion: Not identified  Regional Lymph Nodes:       Pelvic Lymph Nodes Examined:  2 Sentinel                                0 non-sentinel                                   2 total       Pelvic Lymph Nodes with Metastasis: 0                           Macrometastasis: (>2.0  mm): 0                          Micrometastasis: (>0.2 mm and < 2.0 mm): 1                            Isolated Tumor Cells (<0.2 mm): 0                       Laterality of Lymph Node with Tumor: 0                             Extracapsular Extension: N/A  Distant Metastasis:       Distant Site(s) Involved: N/A  Pathologic Stage Classification (pTNM, AJCC 8th Edition): pT1a, pN129m Ancillary Studies: MMR / MSI testing will be ordered  Representative Tumor Block: C1  Comment(s): Cytokeratin AE1/3 was performed on the lymph nodes (parts a and B) and supports the presence of micrometastasis (part B only). Drs. PaSaralyn Pilarnd [facility]viewed select parts of the case and agree  with the above diagnosis.   (v4.2.0.1)     12/29/2020 Surgery   Surgeon: Donaciano Eva Pre-operative Diagnosis: endometrial cancer grade 3    Operation: Robotic-assisted laparoscopic total hysterectomy with bilateral salpingoophorectomy, SLN biopsy   Surgeon: Donaciano Eva  Specimens: uterus, cervix, bilateral tubes and ovaries, right and left obturator SLN           01/22/2021 Cancer Staging   Staging form: Corpus Uteri - Carcinoma and Carcinosarcoma, AJCC 8th Edition - Pathologic stage from 01/22/2021: Stage IIIC1 (pT1a, pN31m, cM0) - Signed by GHeath Lark MD on 01/22/2021 Stage prefix: Initial diagnosis   01/25/2021 Procedure   Successful placement of a right IJ approach Power Port with ultrasound and fluoroscopic guidance. The catheter is ready for use.   02/07/2021 - 05/19/2021 Chemotherapy   Patient is on Treatment Plan : UTERINE Carboplatin AUC 6 / Paclitaxel q21d     02/11/2021 Genetic Testing   Negative hereditary cancer genetic testing: no pathogenic variants detected in [facility] Common Hereditary Cancers +RNA Panel.  The report date is February 11, 2021.    The Common Hereditary Cancers + RNA Panel offered by [facility] includes sequencing, deletion/duplication, and RNA testing of the following 47 genes:  APC, ATM, AXIN2, BARD1, BMPR1A, BRCA1, BRCA2, BRIP1, CDH1, CDK4*, CDKN2A (p14ARF)*, CDKN2A (p16INK4a)*, CHEK2, CTNNA1, DICER1, EPCAM (Deletion/duplication testing only), GREM1 (promoter region deletion/duplication testing only), KIT, MEN1, MLH1, MSH2, MSH3, MSH6, MUTYH, NBN, NF1, NHTL1, PALB2, PDGFRA*, PMS2, POLD1, POLE, PTEN, RAD50, RAD51C, RAD51D, SDHB, SDHC, SDHD, SMAD4, SMARCA4. STK11, TP53, TSC1, TSC2, and VHL.  The following genes were evaluated for sequence changes only: SDHA and HOXB13 c.251G>A variant only.  RNA analysis is not performed for the * genes.     06/14/2021 Imaging   1. Interval hysterectomy and bilateral salpingo oophorectomy. No findings of residual/recurrent malignancy. 2. Substantial lumbar spondylosis and degenerative disc disease. In addition, a right eccentric intervertebral spur at T8-9 causes prominent central narrowing of the thecal sac. 3. Other imaging findings of potential clinical significance: Aortic Atherosclerosis (ICD10-I70.0). Systemic atherosclerosis. Small type 1 hiatal hernia.     08/30/2022 Imaging   1. Status post hysterectomy without evidence of local recurrence. 2. No evidence of metastatic disease within the abdomen or pelvis. 3. Moderate volume of formed stool in the colon. Correlate for constipation. 4. Moderate-sized hiatal hernia. 5.  Aortic Atherosclerosis (ICD10-I70.0).     Interval History: Doing well.  Denies any vaginal bleeding or discharge.  Denies any abdominal or pelvic pain.  Reports similar urinary symptoms.  Has had some improvement recently of her bowel movements.  Past Medical/Surgical History: Past Medical History:  Diagnosis Date   Arthritis    Asthma    Diverticulosis    Dyspnea    with exertion    Endometrial hyperplasia    Esophageal stricture    Family history of breast cancer 01/26/2021   Foot pain    Fundic gland polyps of stomach, benign 07/10/2015   Gastritis    GERD (gastroesophageal reflux disease)     Hemorrhoid    Hiatal hernia    Hip pain    History of kidney stones    cystoscopy   History of surgery on arm    left humerus   Hyperlipidemia    Hypertension    Hypothyroidism    Knee pain    Metabolic syndrome    Obstructive sleep apnea 08/27/2017   pt denies    Oxygen deficiency    pt denies   PONV (postoperative nausea  and vomiting)    Spinal headache    few headaches recently    Tendonitis of ankle    Vitamin D deficiency     Past Surgical History:  Procedure Laterality Date   ANAL FISSURE REPAIR     CATARACT EXTRACTION  May 2009   COLONOSCOPY  12/26/07   ESOPHAGEAL DILATION     ESOPHAGOGASTRODUODENOSCOPY  12/26/07, 05/13/08   EYE SURGERY Bilateral    bilateral cataract with lens implants   IR IMAGING GUIDED PORT INSERTION  01/25/2021   KNEE CARTILAGE SURGERY Right Sept 1999   ORIF HUMERUS FRACTURE Left 01/12/2020   Procedure: OPEN REDUCTION INTERNAL FIXATION (ORIF) LEFT HUMERUS FRACTURE;  Surgeon: Roseanne Kaufman, MD;  Location: [facility];  Service: Orthopedics;  Laterality: Left;   ROBOTIC ASSISTED TOTAL HYSTERECTOMY WITH BILATERAL SALPINGO OOPHERECTOMY Bilateral 12/29/2020   Procedure: XI ROBOTIC ASSISTED TOTAL HYSTERECTOMY WITH BILATERAL SALPINGO OOPHORECTOMY;  Surgeon: Everitt Amber, MD;  [facility];  Service: Gynecology;  Laterality: Bilateral;   SENTINEL NODE BIOPSY N/A 12/29/2020   Procedure: SENTINEL NODE BIOPSY;  Surgeon: Everitt Amber, MD;  [facility];  Service: Gynecology;  Laterality: N/A;   TOTAL KNEE ARTHROPLASTY Right 10/07/2017   Procedure: RIGHT TOTAL KNEE ARTHROPLASTY;  Surgeon: Gaynelle Arabian, MD;  [facility];  Service: Orthopedics;  Laterality: Right;  Adductor Block   UMBILICAL HERNIA REPAIR  05/20/06    Family History  Problem Relation Age of Onset   Colon cancer Mother 85   Heart attack Father    Heart disease Sister    Breast cancer Sister 18   Breast cancer Sister        dx 63s   Cancer Maternal Aunt 22       "female cancer",  unknown type   Cancer Maternal Grandmother        unknown type; dx 76s   Cancer Maternal Grandfather        unknown type; dx 26s   Cancer Cousin        maternal female cousin; dx 25s; unknown type   Cancer Cousin        maternal female cousin; dx 42s; unknown type   Cancer Cousin        maternal female cousin; dx after 26; unknown type   Esophageal cancer Neg Hx    Stomach cancer Neg Hx    Rectal cancer Neg Hx     Social History   Socioeconomic History   Marital status: Married    Spouse name: Marcello Moores   Number of children: 2   Years of education: 14   Highest education level: Associate degree: academic program  Occupational History   Occupation: Retired  Tobacco Use   Smoking status: Never   Smokeless tobacco: Never  Scientific laboratory technician Use: Never used  Substance and Sexual Activity   Alcohol use: No   Drug use: No   Sexual activity: Not Currently    Birth control/protection: Post-menopausal  Other Topics Concern   Not on file  Social History Narrative   Married and retired   No EtOH, tobacco or drug use   Social Determinants of Health   Financial Resource Strain: Low Risk  (05/12/2021)   Overall Financial Resource Strain (CARDIA)    Difficulty of Paying Living Expenses: Not hard at all  Food Insecurity: No Food Insecurity (05/12/2021)   Hunger Vital Sign    Worried About Running Out of Food in the Last Year: Never true    Ran Out of  Food in the Last Year: Never true  Transportation Needs: No Transportation Needs (05/12/2021)   PRAPARE - Hydrologist (Medical): No    Lack of Transportation (Non-Medical): No  Physical Activity: Inactive (05/12/2021)   Exercise Vital Sign    Days of Exercise per Week: 0 days    Minutes of Exercise per Session: 0 min  Stress: Stress Concern Present (05/12/2021)   [place]    Feeling of Stress : To some extent  Social Connections: Socially  Integrated (05/12/2021)   Social Connection and Isolation Panel [NHANES]    Frequency of Communication with Friends and Family: More than three times a week    Frequency of Social Gatherings with Friends and Family: More than three times a week    Attends Religious Services: More than 4 times per year    Active Member of [employer] or Organizations: Yes    Attends Archivist Meetings: 1 to 4 times per year    Marital Status: Married    Current Medications:  Current Outpatient Medications:    acetaminophen (TYLENOL) 500 MG tablet, Take 1,000 mg by mouth as needed (pain.)., Disp: , Rfl:    albuterol (VENTOLIN HFA) 108 (90 Base) MCG/ACT inhaler, Inhale 2 puffs into the lungs every 6 (six) hours as needed for wheezing or shortness of breath. (Patient taking differently: Inhale 2 puffs into the lungs as needed for wheezing or shortness of breath. PRN), Disp: 6.7 g, Rfl: 1   budesonide-formoterol (SYMBICORT) 80-4.5 MCG/ACT inhaler, Inhale 2 puffs into the lungs 2 (two) times daily. (Patient taking differently: Inhale 2 puffs into the lungs 2 (two) times daily as needed.), Disp: 3 each, Rfl: 11   Calcium Carbonate-Vit D-Min (CALCIUM 1200 PO), Take 1 tablet by mouth in the morning., Disp: , Rfl:    Cholecalciferol (VITAMIN D3) 2000 units TABS, Take 2,000 Units by mouth in the morning., Disp: , Rfl:    clobetasol ointment (TEMOVATE) [id number] %, Apply 1 Application topically 3 (three) times a week. Apply cream to the area where you have had itching 3 times a week at night., Disp: 30 g, Rfl: 0   DULoxetine (CYMBALTA) 30 MG capsule, Take 2 capsules (60 mg total) by mouth daily., Disp: 90 capsule, Rfl: 3   furosemide (LASIX) 20 MG tablet, TAKE 1 TABLET BY MOUTH DAILY AS  NEEDED FOR FLUID, Disp: 100 tablet, Rfl: 0   levothyroxine (SYNTHROID) 50 MCG tablet, Take 1 tablet (50 mcg total) by mouth daily before breakfast., Disp: 90 tablet, Rfl: 3   lidocaine-prilocaine (EMLA) cream, Apply to affected area once,  Disp: 30 g, Rfl: 3   losartan-hydrochlorothiazide (HYZAAR) 100-12.5 MG tablet, Take 1 tablet by mouth daily., Disp: 100 tablet, Rfl: 3   mirabegron ER (MYRBETRIQ) 50 MG TB24 tablet, Take 1 tablet (50 mg total) by mouth daily., Disp: 30 tablet, Rfl: 0   Omega-3 Fatty Acids (FISH OIL) 1000 MG CAPS, Take 1,000 mg by mouth in the morning., Disp: , Rfl:    oxybutynin (DITROPAN) 5 MG tablet, TAKE 2 TABLETS BY MOUTH TWICE  DAILY, Disp: 400 tablet, Rfl: 1   pantoprazole (PROTONIX) 40 MG tablet, TAKE 1 TABLET BY MOUTH DAILY, Disp: 100 tablet, Rfl: 2   valACYclovir (VALTREX) 1000 MG tablet, TAKE 1 TABLET BY MOUTH TWICE  DAILY FOR 7 DAYS; IF HAVING  ANOTHER FLARE, TAKE 2 TABLETS  TWICE DAILY FOR 1 DAY ONLY PER  FLAREUP, Disp: 100 tablet, Rfl:  0   vitamin B-12 (CYANOCOBALAMIN) 1000 MCG tablet, Take 1,000 mcg by mouth in the morning., Disp: , Rfl:   Review of Systems: Denies appetite changes, fevers, chills, fatigue, unexplained weight changes. Denies hearing loss, neck lumps or masses, mouth sores, ringing in ears or voice changes. Denies cough or wheezing.  Denies shortness of breath. Denies chest pain or palpitations. Denies leg swelling. Denies abdominal distention, pain, blood in stools, constipation, diarrhea, nausea, vomiting, or early satiety. Denies pain with intercourse, dysuria, frequency, hematuria or incontinence. Denies hot flashes, pelvic pain, vaginal bleeding or vaginal discharge.   Denies joint pain, back pain or muscle pain/cramps. Denies itching, rash, or wounds. Denies dizziness, headaches, numbness or seizures. Denies swollen lymph nodes or glands, denies easy bruising or bleeding. Denies anxiety, depression, confusion, or decreased concentration.  Physical Exam: BP (!) 157/57 (BP Location: Left Arm, Patient Position: Sitting)   Pulse 75   Temp 98 F (36.7 C) (Oral)   Resp 14   Wt 239 lb (108.4 kg)   SpO2 100%   BMI 45.16 kg/m  General: Alert, oriented, no acute  distress. HEENT: Normocephalic, atraumatic, sclera anicteric. Some white patches on her tongue. Chest: Clear to auscultation bilaterally.  No wheezes or rhonchi. Cardiovascular: Regular rate and rhythm, no murmurs. Abdomen: Obese, soft, nontender.  Normoactive bowel sounds.  No masses or hepatosplenomegaly appreciated.  Well-healed incisions. Extremities: Grossly normal range of motion.  Warm, well perfused.  No edema bilaterally. Skin: No rashes or lesions noted. Lymphatics: No cervical, supraclavicular, or inguinal adenopathy. GU: Normal appearing external genitalia without erythema, excoriation, or lesions.  No loss of architecture, no visible lesions along the right labia on exam today.  Speculum exam reveals moderately atrophic vaginal mucosa, no lesions or masses, no bleeding or discharge noted.  Laxity of the vaginal wall noted.  Bimanual exam reveals cuff and vaginal mucosa smooth, no nodularity.  Rectovaginal exam confirms these findings.    Laboratory & Radiologic Studies: CT A/P 08/2022: 1. Status post hysterectomy without evidence of local recurrence. 2. No evidence of metastatic disease within the abdomen or pelvis. 3. Moderate volume of formed stool in the colon. Correlate for constipation. 4. Moderate-sized hiatal hernia. 5.  Aortic Atherosclerosis (ICD10-I70.0).  Assessment & Plan: Stacie Russell is a 72 y.o. 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  Completed adjuvant chemotherapy in 05/2021.  Posttreatment imaging shows no evidence of disease.   Patient is overall doing well and is NED on exam today.   She continues to have urinary frequency and incontinence, has some improvement with oxybutynin.  Not much change since her last visit.   I will plan to see her back in 3 months for surveillance visit.  Discussed signs and symptoms that would be concerning for cancer recurrence and stressed the importance of calling if she  develops any of these.  20 minutes of total time was spent for this patient encounter, including preparation, face-to-face counseling with the patient and coordination of care, and documentation of the encounter.  Jeral Pinch, MD  Division of Gynecologic Oncology  Department of Obstetrics and Gynecology  [facility]

## 2022-10-10 ENCOUNTER — Telehealth: Payer: Self-pay | Admitting: Hematology and Oncology

## 2022-10-10 IMAGING — RF DG ESOPHAGUS
8 series · 15 of 24 positions shown · non-contrast
Comparison: None

CLINICAL DATA: Dysphagia, GERD, history of esophageal dilatation x
2 most recently 0799

EXAM:
ESOPHOGRAM / BARIUM SWALLOW / BARIUM TABLET STUDY
TECHNIQUE: Combined double contrast and single contrast examination performed
using effervescent crystals, thick barium liquid, and thin barium
liquid. The patient was observed with fluoroscopy swallowing a 13 mm
barium sulphate tablet.
FLUOROSCOPY TIME:  Fluoroscopy Time:  1 minutes 36 seconds
Radiation Exposure Index (if provided by the fluoroscopic device):
27.1 mGy
Number of Acquired Spot Images: multiple fluoroscopic screen
captures

[Series 1: cp_standard · 0.17mm/px · 2 of 153 frames shown (1 of 8)]
[frame 23/153]
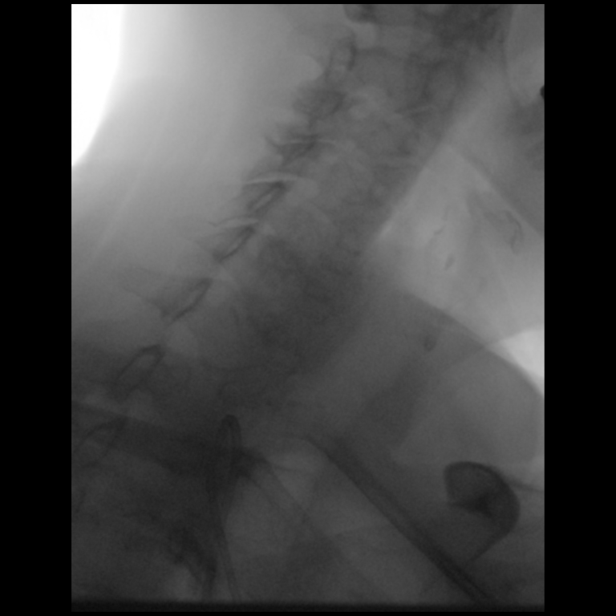
[frame 131/153]
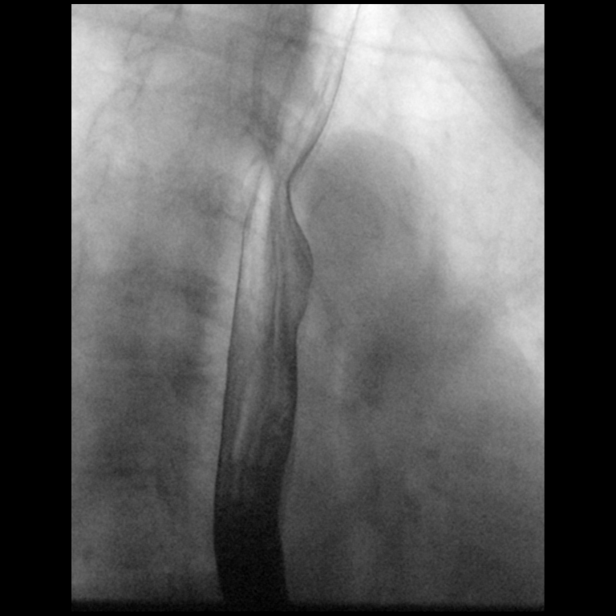

[Series 2: cp_standard · 0.17mm/px · 2 of 124 frames shown (2 of 8)]
[frame 27/124]
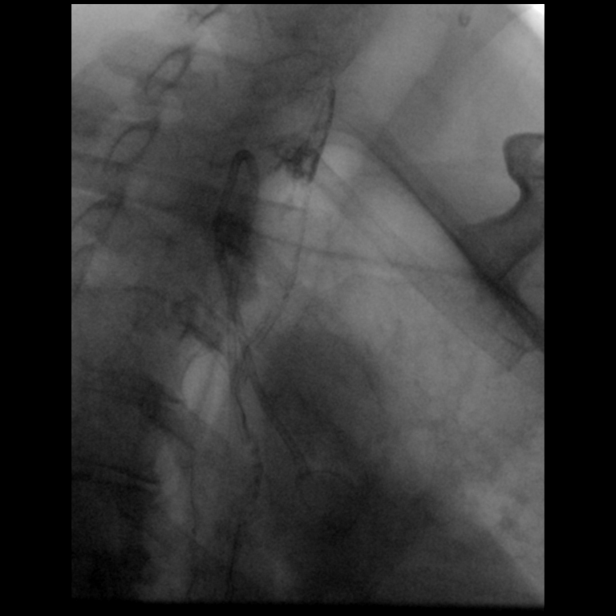
[frame 106/124]
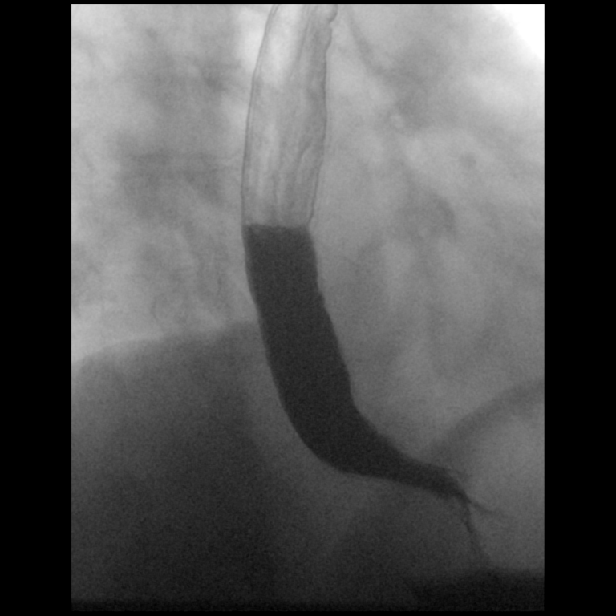

[Series 3: cp_standard · 0.17mm/px · 2 of 82 frames shown (3 of 8)]
[frame 42/82]
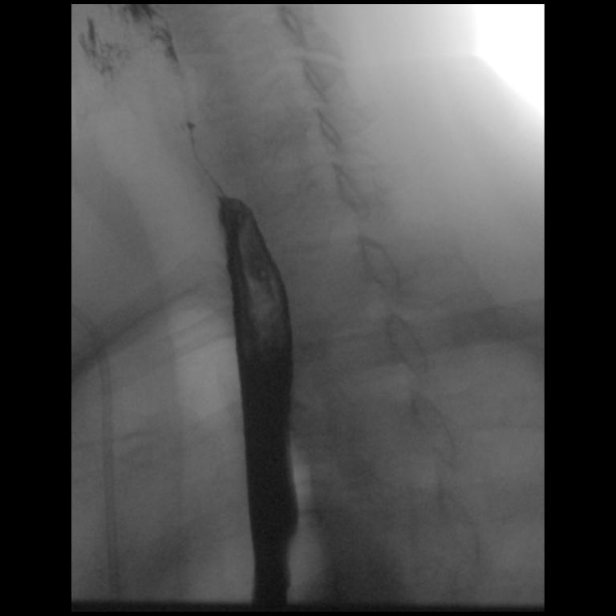
[frame 70/82]
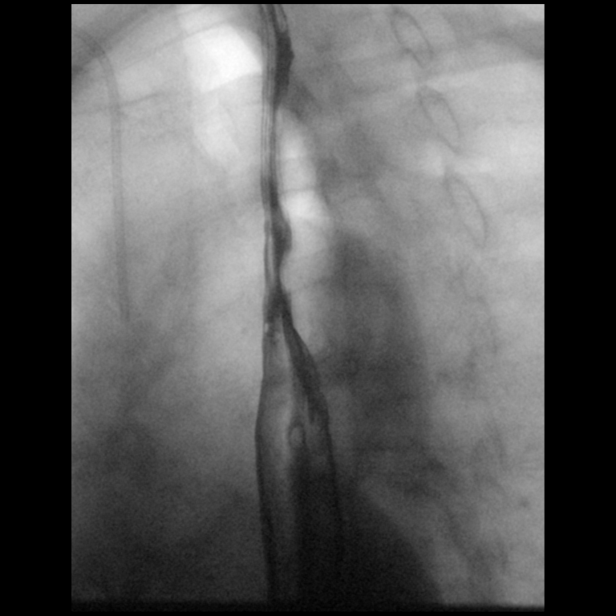

[Series 4: cp_standard · 0.17mm/px · 1 of 117 frames shown (4 of 8)]
[frame 52/117]
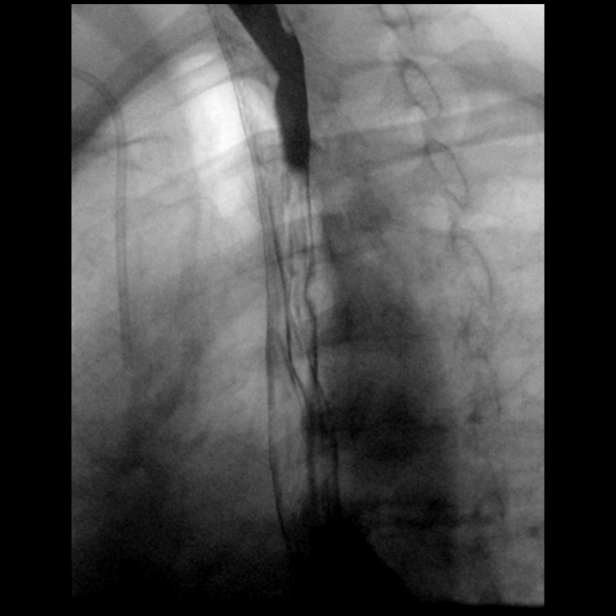

[Series 5: cp_standard · 0.25mm/px · 2 of 95 frames shown (5 of 8)]
[frame 1/95]
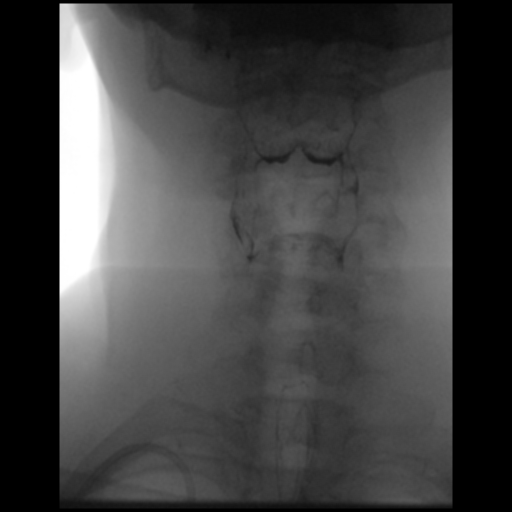
[frame 48/95]
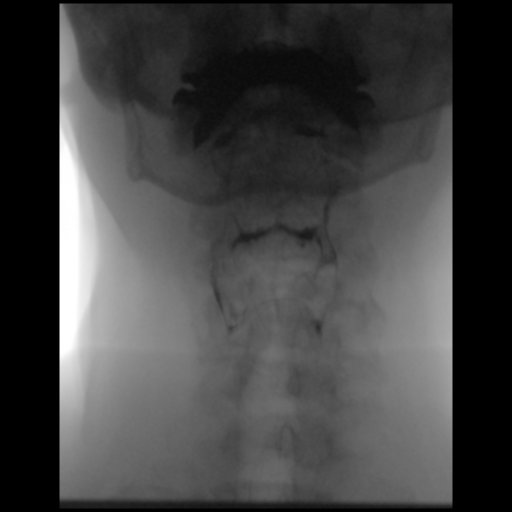

[Series 6: cp_standard · 0.25mm/px · 2 of 166 frames shown (6 of 8)]
[frame 25/166]
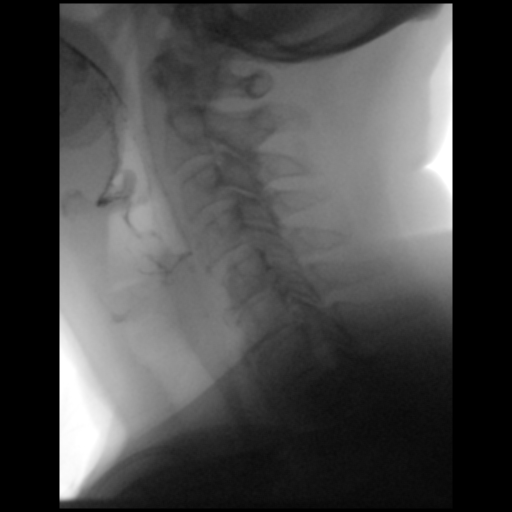
[frame 84/166]
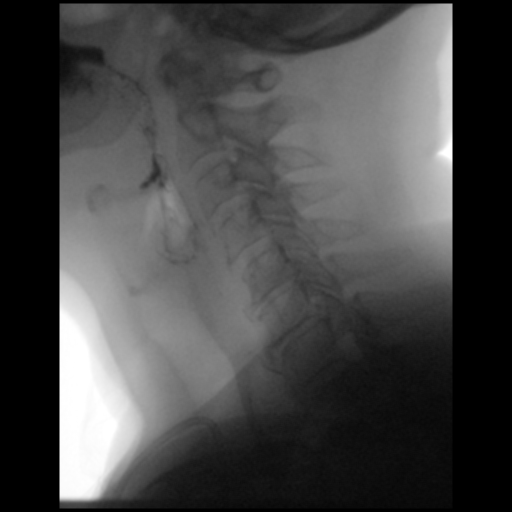

[Series 7: cp_standard · 0.17mm/px · 2 of 126 frames shown (7 of 8)]
[frame 7/126]
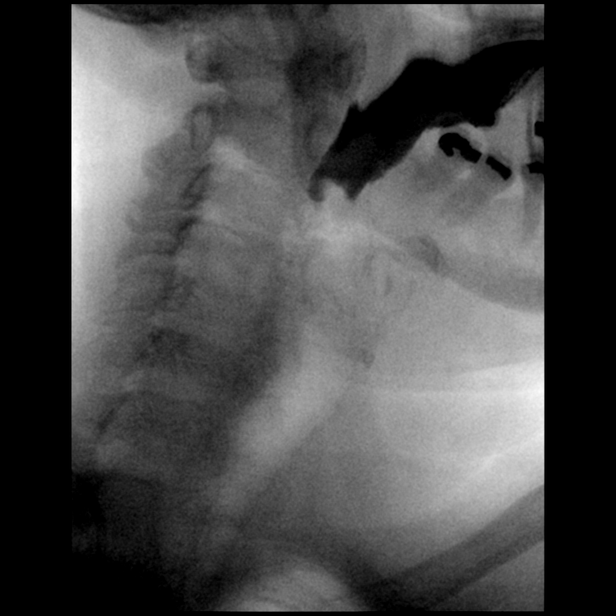
[frame 108/126]
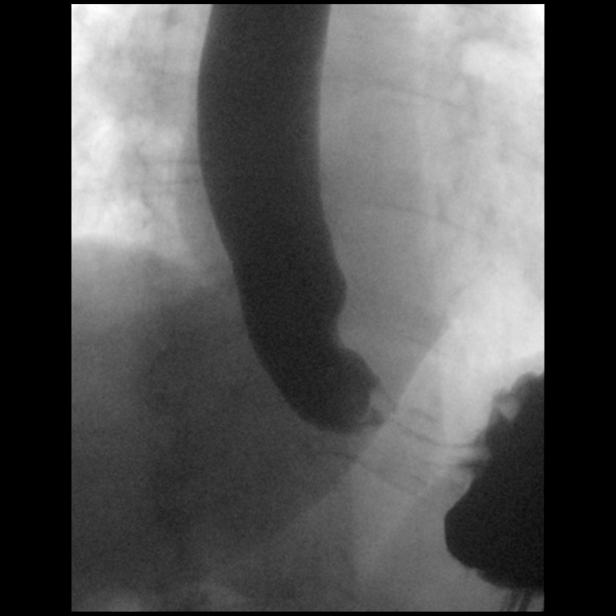

[Series 8: cp_standard · 0.17mm/px · 2 of 107 frames shown (8 of 8)]
[frame 17/107]
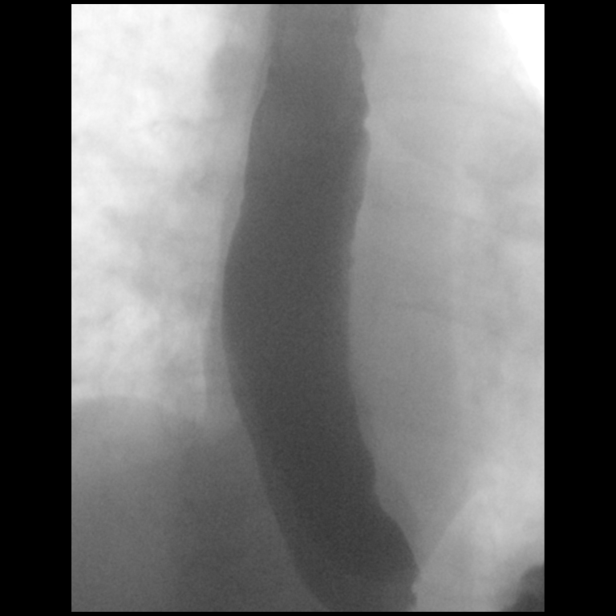
[frame 91/107]
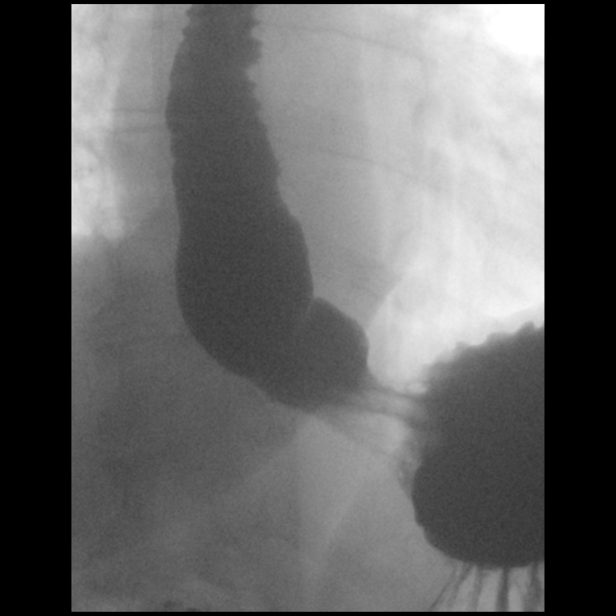

[15 of 24 positions shown; findings below may reference images not displayed]

FINDINGS: Esophageal distention: Normal distention without mass or stricture

Filling defects: Thin mucosal web at anterior wall of upper cervical
esophagus, nonobstructive. No additional filling defects.

12.5 mm barium tablet: Easily passed from oral cavity to stomach
without obstruction

Motility:  Age expected diffuse esophageal dysmotility

Mucosa:  Smooth without irregularity or ulceration

Hypopharynx/cervical esophagus: No laryngeal penetration or
aspiration. No residuals.

Hiatal hernia:  Absent

GE reflux:  Not witnessed during exam

Other:  N/A
IMPRESSION: Thin anterior wall upper cervical esophageal web, nonobstructive.

Age expected diffuse esophageal dysmotility.

Remainder of exam unremarkable.

## 2022-10-10 NOTE — Telephone Encounter (Signed)
Per 2/28 IB reached out to patient ; patient aware of date and time of appointment.

## 2022-10-16 ENCOUNTER — Inpatient Hospital Stay: Payer: HMO | Attending: Gynecologic Oncology

## 2022-10-16 ENCOUNTER — Inpatient Hospital Stay: Payer: HMO

## 2022-10-16 ENCOUNTER — Other Ambulatory Visit: Payer: Self-pay

## 2022-10-16 DIAGNOSIS — Z452 Encounter for adjustment and management of vascular access device: Secondary | ICD-10-CM | POA: Diagnosis not present

## 2022-10-16 DIAGNOSIS — C541 Malignant neoplasm of endometrium: Secondary | ICD-10-CM

## 2022-10-16 DIAGNOSIS — Z8542 Personal history of malignant neoplasm of other parts of uterus: Secondary | ICD-10-CM | POA: Diagnosis not present

## 2022-10-16 MED ORDER — HEPARIN SOD (PORK) LOCK FLUSH 100 UNIT/ML IV SOLN
500.0000 [IU] | Freq: Once | INTRAVENOUS | Status: AC
  Administered 2022-10-16: 500 [IU]

## 2022-10-16 MED ORDER — SODIUM CHLORIDE 0.9% FLUSH
10.0000 mL | Freq: Once | INTRAVENOUS | Status: AC
  Administered 2022-10-16: 10 mL

## 2022-10-18 ENCOUNTER — Encounter: Payer: Self-pay | Admitting: Orthopaedic Surgery

## 2022-10-18 ENCOUNTER — Ambulatory Visit (INDEPENDENT_AMBULATORY_CARE_PROVIDER_SITE_OTHER): Payer: HMO

## 2022-10-18 ENCOUNTER — Ambulatory Visit (INDEPENDENT_AMBULATORY_CARE_PROVIDER_SITE_OTHER): Payer: HMO | Admitting: Orthopaedic Surgery

## 2022-10-18 VITALS — Ht 61.0 in | Wt 235.0 lb

## 2022-10-18 DIAGNOSIS — M79672 Pain in left foot: Secondary | ICD-10-CM

## 2022-10-18 DIAGNOSIS — L84 Corns and callosities: Secondary | ICD-10-CM

## 2022-10-18 NOTE — Progress Notes (Signed)
Office Visit Note   Patient: Stacie Russell           Date of Birth: [birth year]           MRN: [id number] Visit Date: 10/18/2022              Requested by: Janora Norlander, DO [place],  [place] [id number] PCP: Janora Norlander, DO   Assessment & Plan: Visit Diagnoses:  1. Pain in left foot   2. Callus of foot     Plan: Calluses were trimmed x 2 using a 10 scalpel blade.  We spent considerable time discussing callus debridement appropriate use of files that she could get at the drugstore.  She will have to work on this couple times a week to keep them soft.  Good improvement in foot pain.  We discussed using inserts with more arch support to help unload metatarsal heads.  She can look into having her children order some [employer] colored inserts.  She can follow-up with me as needed.  Follow-Up Instructions: No follow-ups on file.   Orders:  Orders Placed This Encounter  Procedures   XR Foot Complete Left   No orders of the defined types were placed in this encounter.     Procedures: No procedures performed   Clinical Data: No additional findings.   Subjective: Chief Complaint  Patient presents with   Left Foot - Pain    HPI 72 year old female seen by Dr.Gottschalk with problems with left dorsal foot pain.  She has had painful calluses on the dorsum of the left foot and has had pain over the metatarsal heads.  She thinks she might have a little bit of mild swelling.  No plantar foot ulcerations.  She states it is hard for her to continue walking with tennis shoes due to the pain.  She does some trimming over the calluses but they remain extremely thick.  BMI 44.  Previous right total knee arthroplasty and ORIF left humerus fracture.  Review of Systems all the systems noncontributory to HPI.   Objective: Vital Signs: Ht '5\' 1"'$  (1.549 m)   Wt 235 lb (106.6 kg)   BMI 44.40 kg/m   Physical Exam Constitutional:      Appearance: She is  well-developed.  HENT:     Head: Normocephalic.     Right Ear: External ear normal.     Left Ear: External ear normal. There is no impacted cerumen.  Eyes:     Pupils: Pupils are equal, round, and reactive to light.  Neck:     Thyroid: No thyromegaly.     Trachea: No tracheal deviation.  Cardiovascular:     Rate and Rhythm: Normal rate.  Pulmonary:     Effort: Pulmonary effort is normal.  Abdominal:     Palpations: Abdomen is soft.  Musculoskeletal:     Cervical back: No rigidity.  Skin:    General: Skin is warm and dry.  Neurological:     Mental Status: She is alert and oriented to person, place, and time.  Psychiatric:        Behavior: Behavior normal.     Ortho Exam well-healed right midline knee incision collaterally with balance.  Painful calluses over the metatarsal heads left foot without ulceration thickened 8 to 10 mm.  Specialty Comments:  No specialty comments available.  Imaging: No results found.   PMFS History: Patient Active Problem List   Diagnosis Date Noted   Callus of  foot 10/19/2022   Pancytopenia, acquired ([place]) 05/19/2021   Dysuria 03/06/2021   Arthralgia 02/28/2021   Other constipation 02/14/2021   Genetic testing 02/14/2021   Family history of breast cancer 01/26/2021   Pelvic floor dysfunction 01/23/2021   Endometrial cancer ([place]) 12/19/2020   Closed displaced comminuted fracture of shaft of left humerus 01/12/2020   Pain in left arm 01/04/2020   Synovitis of left ankle 09/24/2019   Trigger thumb, right thumb 09/24/2019   Degeneration of lumbar intervertebral disc 10/24/2018   OA (osteoarthritis) of knee 10/07/2017   Severe obstructive sleep apnea 08/27/2017   Oxygen deficiency    Hypertension    Spinal stenosis of lumbar region 03/08/2017   Other fatigue 02/15/2017   Palpitations 01/16/2017   Fundic gland polyps of stomach, benign 07/10/2015   Fundic gland polyposis of stomach 07/10/2015   Family history of colon cancer in mother  - dx 40's 07/04/2015   Overactive bladder 12/29/2014   Chest pain [id number]   Metabolic syndrome [id number]   Obesity, Class III, BMI 40-49.9 (morbid obesity) ([place]) 05/07/2013   Morbid obesity ([place]) 05/07/2013   Essential hypertension, benign 11/03/2012   Vitamin D deficiency 11/03/2012   Knee pain    Endometrial hyperplasia    Hemorrhoid    Foot pain    Esophageal stricture    Diverticulosis    Hip pain    Gastritis    Tendonitis of ankle    Hiatal hernia    Mixed hyperlipidemia    Asthma    GERD 10/03/2009   Past Medical History:  Diagnosis Date   Arthritis    Asthma    Diverticulosis    Dyspnea    with exertion    Endometrial hyperplasia    Esophageal stricture    Family history of breast cancer 01/26/2021   Foot pain    Fundic gland polyps of stomach, benign 07/10/2015   Gastritis    GERD (gastroesophageal reflux disease)    Hemorrhoid    Hiatal hernia    Hip pain    History of kidney stones    cystoscopy   History of surgery on arm    left humerus   Hyperlipidemia    Hypertension    Hypothyroidism    Knee pain    Metabolic syndrome    Obstructive sleep apnea 08/27/2017   pt denies    Oxygen deficiency    pt denies   PONV (postoperative nausea and vomiting)    Spinal headache    few headaches recently    Tendonitis of ankle    Vitamin D deficiency     Family History  Problem Relation Age of Onset   Colon cancer Mother 68   Heart attack Father    Heart disease Sister    Breast cancer Sister 68   Breast cancer Sister        dx 6s   Cancer Maternal Aunt 64       "female cancer", unknown type   Cancer Maternal Grandmother        unknown type; dx 84s   Cancer Maternal Grandfather        unknown type; dx 10s   Cancer Cousin        maternal female cousin; dx 1s; unknown type   Cancer Cousin        maternal female cousin; dx 62s; unknown type   Cancer Cousin        maternal female cousin; dx after 63; unknown type   Esophageal cancer  Neg Hx     Stomach cancer Neg Hx    Rectal cancer Neg Hx     Past Surgical History:  Procedure Laterality Date   ANAL FISSURE REPAIR     CATARACT EXTRACTION  May 2009   COLONOSCOPY  12/26/07   ESOPHAGEAL DILATION     ESOPHAGOGASTRODUODENOSCOPY  12/26/07, 05/13/08   EYE SURGERY Bilateral    bilateral cataract with lens implants   IR IMAGING GUIDED PORT INSERTION  01/25/2021   KNEE CARTILAGE SURGERY Right Sept 1999   ORIF HUMERUS FRACTURE Left 01/12/2020   Procedure: OPEN REDUCTION INTERNAL FIXATION (ORIF) LEFT HUMERUS FRACTURE;  Surgeon: Roseanne Kaufman, MD;  Location: [facility];  Service: Orthopedics;  Laterality: Left;   ROBOTIC ASSISTED TOTAL HYSTERECTOMY WITH BILATERAL SALPINGO OOPHERECTOMY Bilateral 12/29/2020   Procedure: XI ROBOTIC ASSISTED TOTAL HYSTERECTOMY WITH BILATERAL SALPINGO OOPHORECTOMY;  Surgeon: Everitt Amber, MD;  Location: [facility];  Service: Gynecology;  Laterality: Bilateral;   SENTINEL NODE BIOPSY N/A 12/29/2020   Procedure: SENTINEL NODE BIOPSY;  Surgeon: Everitt Amber, MD;  Location: [facility];  Service: Gynecology;  Laterality: N/A;   TOTAL KNEE ARTHROPLASTY Right 10/07/2017   Procedure: RIGHT TOTAL KNEE ARTHROPLASTY;  Surgeon: Gaynelle Arabian, MD;  Location: [facility];  Service: Orthopedics;  Laterality: Right;  Adductor Block   UMBILICAL HERNIA REPAIR  05/20/06   Social History   Occupational History   Occupation: Retired  Tobacco Use   Smoking status: Never   Smokeless tobacco: Never  Vaping Use   Vaping Use: Never used  Substance and Sexual Activity   Alcohol use: No   Drug use: No   Sexual activity: Not Currently    Birth control/protection: Post-menopausal

## 2022-10-19 DIAGNOSIS — L84 Corns and callosities: Secondary | ICD-10-CM | POA: Insufficient documentation

## 2022-10-24 DIAGNOSIS — Z20822 Contact with and (suspected) exposure to covid-19: Secondary | ICD-10-CM | POA: Diagnosis not present

## 2022-10-24 DIAGNOSIS — J029 Acute pharyngitis, unspecified: Secondary | ICD-10-CM | POA: Diagnosis not present

## 2022-10-24 DIAGNOSIS — J020 Streptococcal pharyngitis: Secondary | ICD-10-CM | POA: Diagnosis not present

## 2022-11-07 ENCOUNTER — Telehealth: Payer: Self-pay | Admitting: Family Medicine

## 2022-11-07 ENCOUNTER — Telehealth (INDEPENDENT_AMBULATORY_CARE_PROVIDER_SITE_OTHER): Payer: HMO | Admitting: Family Medicine

## 2022-11-07 ENCOUNTER — Telehealth: Payer: HMO

## 2022-11-07 ENCOUNTER — Encounter: Payer: Self-pay | Admitting: Family Medicine

## 2022-11-07 ENCOUNTER — Other Ambulatory Visit: Payer: Self-pay

## 2022-11-07 DIAGNOSIS — R399 Unspecified symptoms and signs involving the genitourinary system: Secondary | ICD-10-CM | POA: Diagnosis not present

## 2022-11-07 DIAGNOSIS — R300 Dysuria: Secondary | ICD-10-CM

## 2022-11-07 LAB — URINALYSIS
Bilirubin, UA: NEGATIVE
Glucose, UA: NEGATIVE
Ketones, UA: NEGATIVE
Nitrite, UA: POSITIVE — AB
Specific Gravity, UA: 1.025 (ref 1.005–1.030)
Urobilinogen, Ur: 1 mg/dL (ref 0.2–1.0)
pH, UA: 7 (ref 5.0–7.5)

## 2022-11-07 MED ORDER — FLUCONAZOLE 150 MG PO TABS: 150.0000 mg | ORAL_TABLET | Freq: Once | ORAL | 0 refills | Status: AC

## 2022-11-07 MED ORDER — CEPHALEXIN 500 MG PO CAPS: 500.0000 mg | ORAL_CAPSULE | Freq: Four times a day (QID) | ORAL | 0 refills | Status: DC

## 2022-11-07 NOTE — Progress Notes (Signed)
Virtual Visit via mychart video Note  I connected with Stacie Russell on 11/07/22 at 1517 by video and verified that I am speaking with the correct person using two identifiers. Stacie Russell is currently located at home and patient are currently with her during visit. The provider, Fransisca Kaufmann Jissell Trafton, MD is located in their office at time of visit.  Call ended at 1528  I discussed the limitations, risks, security and privacy concerns of performing an evaluation and management service by video and the availability of in person appointments. I also discussed with the patient that there may be a patient responsible charge related to this service. The patient expressed understanding and agreed to proceed.   History and Present Illness: 1. UTI symptoms   Patient is having burning and pain in back and lower stomach.  She denies hematuria.  She started with urinary symptoms 2-3 days ago. She denies recurrent uti's.   Outpatient Encounter Medications as of 11/07/2022  Medication Sig   cephALEXin (KEFLEX) 500 MG capsule Take 1 capsule (500 mg total) by mouth 4 (four) times daily.   fluconazole (DIFLUCAN) 150 MG tablet Take 1 tablet (150 mg total) by mouth once for 1 dose.   acetaminophen (TYLENOL) 500 MG tablet Take 1,000 mg by mouth as needed (pain.).   albuterol (VENTOLIN HFA) 108 (90 Base) MCG/ACT inhaler Inhale 2 puffs into the lungs every 6 (six) hours as needed for wheezing or shortness of breath. (Patient taking differently: Inhale 2 puffs into the lungs as needed for wheezing or shortness of breath. PRN)   budesonide-formoterol (SYMBICORT) 80-4.5 MCG/ACT inhaler Inhale 2 puffs into the lungs 2 (two) times daily. (Patient taking differently: Inhale 2 puffs into the lungs 2 (two) times daily as needed.)   Calcium Carbonate-Vit D-Min (CALCIUM 1200 PO) Take 1 tablet by mouth in the morning.   Cholecalciferol (VITAMIN D3) 2000 units TABS Take 2,000 Units by mouth in the morning.    clobetasol ointment (TEMOVATE) [id number] % Apply 1 Application topically 3 (three) times a week. Apply cream to the area where you have had itching 3 times a week at night.   DULoxetine (CYMBALTA) 30 MG capsule Take 2 capsules (60 mg total) by mouth daily.   furosemide (LASIX) 20 MG tablet TAKE 1 TABLET BY MOUTH DAILY AS  NEEDED FOR FLUID   levothyroxine (SYNTHROID) 50 MCG tablet Take 1 tablet (50 mcg total) by mouth daily before breakfast.   lidocaine-prilocaine (EMLA) cream Apply to affected area once   losartan-hydrochlorothiazide (HYZAAR) 100-12.5 MG tablet Take 1 tablet by mouth daily.   mirabegron ER (MYRBETRIQ) 50 MG TB24 tablet Take 1 tablet (50 mg total) by mouth daily.   Omega-3 Fatty Acids (FISH OIL) 1000 MG CAPS Take 1,000 mg by mouth in the morning.   oxybutynin (DITROPAN) 5 MG tablet TAKE 2 TABLETS BY MOUTH TWICE  DAILY   pantoprazole (PROTONIX) 40 MG tablet TAKE 1 TABLET BY MOUTH DAILY   valACYclovir (VALTREX) 1000 MG tablet TAKE 1 TABLET BY MOUTH TWICE  DAILY FOR 7 DAYS; IF HAVING  ANOTHER FLARE, TAKE 2 TABLETS  TWICE DAILY FOR 1 DAY ONLY PER  FLAREUP   vitamin B-12 (CYANOCOBALAMIN) 1000 MCG tablet Take 1,000 mcg by mouth in the morning.   No facility-administered encounter medications on file as of 11/07/2022.    Review of Systems  Constitutional:  Negative for chills and fever.  Eyes:  Negative for visual disturbance.  Respiratory:  Negative for chest tightness and shortness of  breath.   Cardiovascular:  Negative for chest pain and leg swelling.  Gastrointestinal:  Positive for abdominal pain.  Genitourinary:  Positive for dysuria, flank pain, frequency and urgency. Negative for difficulty urinating, hematuria, vaginal bleeding, vaginal discharge and vaginal pain.  Musculoskeletal:  Negative for back pain and gait problem.  Skin:  Negative for rash.  Neurological:  Negative for light-headedness and headaches.  Psychiatric/Behavioral:  Negative for agitation and behavioral  problems.   All other systems reviewed and are negative.   Observations/Objective:   Assessment and Plan: Problem List Items Addressed This Visit   None Visit Diagnoses     UTI symptoms    -  Primary   Relevant Medications   cephALEXin (KEFLEX) 500 MG capsule   fluconazole (DIFLUCAN) 150 MG tablet   Other Relevant Orders   Urine Culture   Urinalysis       Urinalysis: Nitrite positive and 3+ leukocytes and 2+ blood Follow up plan: Return if symptoms worsen or fail to improve.     I discussed the assessment and treatment plan with the patient. The patient was provided an opportunity to ask questions and all were answered. The patient agreed with the plan and demonstrated an understanding of the instructions.   The patient was advised to call back or seek an in-person evaluation if the symptoms worsen or if the condition fails to improve as anticipated.  The above assessment and management plan was discussed with the patient. The patient verbalized understanding of and has agreed to the management plan. Patient is aware to call the clinic if symptoms persist or worsen. Patient is aware when to return to the clinic for a follow-up visit. Patient educated on when it is appropriate to go to the emergency department.    I provided 11 minutes of non-face-to-face time during this encounter.    Worthy Rancher, MD

## 2022-11-07 NOTE — Telephone Encounter (Signed)
Patient changed insurance and now uses [facility] in [place]. Needs all of her refills sent to [facility] in [place].

## 2022-11-07 NOTE — Telephone Encounter (Signed)
Patient aware to call the pharmacy and get them all transferred

## 2022-11-07 NOTE — Addendum Note (Signed)
Addended by: Michaela Corner on: 11/07/2022 12:15 PM   Modules accepted: Orders

## 2022-11-08 ENCOUNTER — Ambulatory Visit: Payer: Self-pay

## 2022-11-09 LAB — URINE CULTURE

## 2022-11-15 ENCOUNTER — Other Ambulatory Visit: Payer: Self-pay | Admitting: *Deleted

## 2022-11-15 ENCOUNTER — Telehealth: Payer: Self-pay | Admitting: *Deleted

## 2022-11-15 DIAGNOSIS — N3281 Overactive bladder: Secondary | ICD-10-CM

## 2022-11-15 NOTE — Telephone Encounter (Signed)
[facility] contacted our office stating that patient contacted them and asked to have her medications transferrred. Patient should have called [facility] mail order and have them transfer rx's to [place].  Nevertheless, patient needs her prescriptions sent to [facility] in [place].

## 2022-11-16 ENCOUNTER — Other Ambulatory Visit: Payer: Self-pay

## 2022-11-16 DIAGNOSIS — E039 Hypothyroidism, unspecified: Secondary | ICD-10-CM

## 2022-11-16 DIAGNOSIS — N3281 Overactive bladder: Secondary | ICD-10-CM

## 2022-11-16 DIAGNOSIS — Z6379 Other stressful life events affecting family and household: Secondary | ICD-10-CM

## 2022-11-16 DIAGNOSIS — I10 Essential (primary) hypertension: Secondary | ICD-10-CM

## 2022-11-30 ENCOUNTER — Other Ambulatory Visit: Payer: Self-pay | Admitting: *Deleted

## 2022-11-30 DIAGNOSIS — Z6379 Other stressful life events affecting family and household: Secondary | ICD-10-CM

## 2022-11-30 MED ORDER — PANTOPRAZOLE SODIUM 40 MG PO TBEC: 40.0000 mg | DELAYED_RELEASE_TABLET | Freq: Every day | ORAL | 2 refills | Status: DC

## 2022-12-14 ENCOUNTER — Telehealth: Payer: Self-pay | Admitting: Hematology and Oncology

## 2022-12-18 ENCOUNTER — Inpatient Hospital Stay: Payer: HMO

## 2022-12-18 ENCOUNTER — Other Ambulatory Visit: Payer: Self-pay

## 2022-12-18 ENCOUNTER — Inpatient Hospital Stay: Payer: HMO | Attending: Gynecologic Oncology

## 2022-12-18 DIAGNOSIS — Z8542 Personal history of malignant neoplasm of other parts of uterus: Secondary | ICD-10-CM | POA: Insufficient documentation

## 2022-12-18 DIAGNOSIS — C541 Malignant neoplasm of endometrium: Secondary | ICD-10-CM

## 2022-12-18 MED ORDER — HEPARIN SOD (PORK) LOCK FLUSH 100 UNIT/ML IV SOLN
500.0000 [IU] | Freq: Once | INTRAVENOUS | Status: AC
  Administered 2022-12-18: 500 [IU]

## 2022-12-18 MED ORDER — SODIUM CHLORIDE 0.9% FLUSH
10.0000 mL | Freq: Once | INTRAVENOUS | Status: AC
  Administered 2022-12-18: 10 mL

## 2023-01-04 ENCOUNTER — Ambulatory Visit: Payer: HMO | Admitting: Gynecologic Oncology

## 2023-01-09 ENCOUNTER — Telehealth: Payer: Self-pay | Admitting: *Deleted

## 2023-01-09 NOTE — Telephone Encounter (Signed)
Patient rescheduled her appt from 5/31 to 6/27

## 2023-01-11 ENCOUNTER — Ambulatory Visit: Payer: HMO | Admitting: Gynecologic Oncology

## 2023-01-18 ENCOUNTER — Ambulatory Visit (INDEPENDENT_AMBULATORY_CARE_PROVIDER_SITE_OTHER): Payer: HMO | Admitting: Family Medicine

## 2023-01-18 ENCOUNTER — Encounter: Payer: Self-pay | Admitting: Family Medicine

## 2023-01-18 VITALS — BP 111/65 | HR 80 | Temp 98.5°F | Ht 61.0 in | Wt 237.0 lb

## 2023-01-18 DIAGNOSIS — G4733 Obstructive sleep apnea (adult) (pediatric): Secondary | ICD-10-CM

## 2023-01-18 DIAGNOSIS — C541 Malignant neoplasm of endometrium: Secondary | ICD-10-CM

## 2023-01-18 DIAGNOSIS — E039 Hypothyroidism, unspecified: Secondary | ICD-10-CM | POA: Diagnosis not present

## 2023-01-18 DIAGNOSIS — I10 Essential (primary) hypertension: Secondary | ICD-10-CM | POA: Diagnosis not present

## 2023-01-18 DIAGNOSIS — Z6841 Body Mass Index (BMI) 40.0 and over, adult: Secondary | ICD-10-CM | POA: Diagnosis not present

## 2023-01-18 DIAGNOSIS — I700 Atherosclerosis of aorta: Secondary | ICD-10-CM

## 2023-01-18 MED ORDER — PANTOPRAZOLE SODIUM 40 MG PO TBEC: 40.0000 mg | DELAYED_RELEASE_TABLET | Freq: Two times a day (BID) | ORAL | 3 refills | Status: DC

## 2023-01-18 NOTE — Progress Notes (Signed)
Subjective: CC:HTN/ HLD/ Thyroid PCP: Raliegh Ip, DO ZOX:[id number] Stacie Russell is a 72 y.o. female presenting to clinic today for:  1. Hypothyroidism Reports compliance with her levothyroxine.  Reports fatigue and this is been a chronic issue for her.  She notes that she has had 3 different sleep studies done but has never heard from anyone regarding them.  Sometimes she sleeps just fine but most the time she wakes up still tired in the morning.  She just pushes through it.  2. HTN/ HLD/Aortic atherosclerosis associated with morbid obesity She is compliant with her antihypertensives.  She does not follow any specific diet or exercise regimen.  She does report acid reflux that is not controlled so she self titrated Protonix up to twice daily dosing because her husband, who suffered a stomach bleed does that regimen.   ROS: Per HPI  Allergies  Allergen Reactions   Tramadol Other (See Comments)    Unsure of reaction   Past Medical History:  Diagnosis Date   Arthritis    Asthma    Diverticulosis    Dyspnea    with exertion    Endometrial hyperplasia    Esophageal stricture    Family history of breast cancer 01/26/2021   Foot pain    Fundic gland polyps of stomach, benign 07/10/2015   Gastritis    GERD (gastroesophageal reflux disease)    Hemorrhoid    Hiatal hernia    Hip pain    History of kidney stones    cystoscopy   History of surgery on arm    left humerus   Hyperlipidemia    Hypertension    Hypothyroidism    Knee pain    Metabolic syndrome    Obstructive sleep apnea 08/27/2017   pt denies    Oxygen deficiency    pt denies   PONV (postoperative nausea and vomiting)    Spinal headache    few headaches recently    Tendonitis of ankle    Vitamin D deficiency     Current Outpatient Medications:    acetaminophen (TYLENOL) 500 MG tablet, Take 1,000 mg by mouth as needed (pain.)., Disp: , Rfl:    albuterol (VENTOLIN HFA) 108 (90 Base) MCG/ACT inhaler, Inhale  2 puffs into the lungs every 6 (six) hours as needed for wheezing or shortness of breath. (Patient taking differently: Inhale 2 puffs into the lungs as needed for wheezing or shortness of breath. PRN), Disp: 6.7 g, Rfl: 1   budesonide-formoterol (SYMBICORT) 80-4.5 MCG/ACT inhaler, Inhale 2 puffs into the lungs 2 (two) times daily. (Patient taking differently: Inhale 2 puffs into the lungs 2 (two) times daily as needed.), Disp: 3 each, Rfl: 11   Calcium Carbonate-Vit D-Min (CALCIUM 1200 PO), Take 1 tablet by mouth in the morning., Disp: , Rfl:    cephALEXin (KEFLEX) 500 MG capsule, Take 1 capsule (500 mg total) by mouth 4 (four) times daily., Disp: 28 capsule, Rfl: 0   Cholecalciferol (VITAMIN D3) 2000 units TABS, Take 2,000 Units by mouth in the morning., Disp: , Rfl:    clobetasol ointment (TEMOVATE) 0.05 %, Apply 1 Application topically 3 (three) times a week. Apply cream to the area where you have had itching 3 times a week at night., Disp: 30 g, Rfl: 0   DULoxetine (CYMBALTA) 30 MG capsule, Take 2 capsules (60 mg total) by mouth daily., Disp: 90 capsule, Rfl: 3   furosemide (LASIX) 20 MG tablet, TAKE 1 TABLET BY MOUTH DAILY AS  NEEDED  FOR FLUID, Disp: 100 tablet, Rfl: 0   levothyroxine (SYNTHROID) 50 MCG tablet, Take 1 tablet (50 mcg total) by mouth daily before breakfast., Disp: 90 tablet, Rfl: 3   lidocaine-prilocaine (EMLA) cream, Apply to affected area once, Disp: 30 g, Rfl: 3   losartan-hydrochlorothiazide (HYZAAR) 100-12.5 MG tablet, Take 1 tablet by mouth daily., Disp: 100 tablet, Rfl: 3   mirabegron ER (MYRBETRIQ) 50 MG TB24 tablet, Take 1 tablet (50 mg total) by mouth daily., Disp: 30 tablet, Rfl: 0   Omega-3 Fatty Acids (FISH OIL) 1000 MG CAPS, Take 1,000 mg by mouth in the morning., Disp: , Rfl:    oxybutynin (DITROPAN) 5 MG tablet, TAKE 2 TABLETS BY MOUTH TWICE  DAILY, Disp: 400 tablet, Rfl: 1   pantoprazole (PROTONIX) 40 MG tablet, Take 1 tablet (40 mg total) by mouth daily., Disp: 100  tablet, Rfl: 2   valACYclovir (VALTREX) 1000 MG tablet, TAKE 1 TABLET BY MOUTH TWICE  DAILY FOR 7 DAYS; IF HAVING  ANOTHER FLARE, TAKE 2 TABLETS  TWICE DAILY FOR 1 DAY ONLY PER  FLAREUP, Disp: 100 tablet, Rfl: 0   vitamin B-12 (CYANOCOBALAMIN) 1000 MCG tablet, Take 1,000 mcg by mouth in the morning., Disp: , Rfl:  Social History   Socioeconomic History   Marital status: Married    Spouse name: Maisie Fus   Number of children: 2   Years of education: 14   Highest education level: Associate degree: academic program  Occupational History   Occupation: Retired  Tobacco Use   Smoking status: Never   Smokeless tobacco: Never  Building services engineer Use: Never used  Substance and Sexual Activity   Alcohol use: No   Drug use: No   Sexual activity: Not Currently    Birth control/protection: Post-menopausal  Other Topics Concern   Not on file  Social History Narrative   Married and retired   No EtOH, tobacco or drug use   Social Determinants of Health   Financial Resource Strain: Low Risk  (05/12/2021)   Overall Financial Resource Strain (CARDIA)    Difficulty of Paying Living Expenses: Not hard at all  Food Insecurity: No Food Insecurity (05/12/2021)   Hunger Vital Sign    Worried About Running Out of Food in the Last Year: Never true    Ran Out of Food in the Last Year: Never true  Transportation Needs: No Transportation Needs (05/12/2021)   PRAPARE - Administrator, Civil Service (Medical): No    Lack of Transportation (Non-Medical): No  Physical Activity: Inactive (05/12/2021)   Exercise Vital Sign    Days of Exercise per Week: 0 days    Minutes of Exercise per Session: 0 min  Stress: Stress Concern Present (05/12/2021)   [employer] of Occupational Health - Occupational Stress Questionnaire    Feeling of Stress : To some extent  Social Connections: Socially Integrated (05/12/2021)   Social Connection and Isolation Panel [NHANES]    Frequency of Communication with  Friends and Family: More than three times a week    Frequency of Social Gatherings with Friends and Family: More than three times a week    Attends Religious Services: More than 4 times per year    Active Member of [employer] or Organizations: Yes    Attends Banker Meetings: 1 to 4 times per year    Marital Status: Married  Catering manager Violence: Not At Risk (05/12/2021)   Humiliation, Afraid, Rape, and Kick questionnaire    Fear  of Current or Ex-Partner: No    Emotionally Abused: No    Physically Abused: No    Sexually Abused: No   Family History  Problem Relation Age of Onset   Colon cancer Mother 43   Heart attack Father    Heart disease Sister    Breast cancer Sister 55   Breast cancer Sister        dx 35s   Cancer Maternal Aunt 36       "female cancer", unknown type   Cancer Maternal Grandmother        unknown type; dx 24s   Cancer Maternal Grandfather        unknown type; dx 20s   Cancer Cousin        maternal female cousin; dx 38s; unknown type   Cancer Cousin        maternal female cousin; dx 16s; unknown type   Cancer Cousin        maternal female cousin; dx after 16; unknown type   Esophageal cancer Neg Hx    Stomach cancer Neg Hx    Rectal cancer Neg Hx     Objective: Office vital signs reviewed. BP 111/65   Pulse 80   Temp 98.5 F (36.9 C)   Ht 5\' 1"  (1.549 m)   Wt 237 lb (107.5 kg)   SpO2 94%   BMI 44.78 kg/m   Physical Examination:  General: Awake, alert, morbidly obese, No acute distress HEENT: Sclera white.  No exophthalmos.  No goiter Cardio: regular rate and rhythm, S1S2 heard, no murmurs appreciated Pulm: clear to auscultation bilaterally, no wheezes, rhonchi or rales; normal work of breathing on room air Breast: Macromastia present  Assessment/ Plan: 72 y.o. female   Acquired hypothyroidism - Plan: T4, Free, TSH, TSH, T4, Free, CANCELED: TSH, CANCELED: T4, Free  Essential hypertension, benign - Plan: CANCELED:  CMP14+EGFR  Endometrial cancer (HCC) - Plan: CANCELED: CBC  Aortic atherosclerosis (HCC) - Plan: Lipid Panel, Lipid Panel, CANCELED: CMP14+EGFR, CANCELED: Lipid Panel  Obesity, Class III, BMI 40-49.9 (morbid obesity) (HCC) - Plan: Bayer DCA Hb A1c Waived, VITAMIN D 25 Hydroxy (Vit-D Deficiency, Fractures), VITAMIN D 25 Hydroxy (Vit-D Deficiency, Fractures), CANCELED: Bayer DCA Hb A1c Waived, CANCELED: VITAMIN D 25 Hydroxy (Vit-D Deficiency, Fractures)  OSA (obstructive sleep apnea)  Check thyroid levels given reports of excessive fatigue but I suspect this is due to untreated obstructive sleep apnea.  I was able to locate a 2019 study which showed need for BiPAP though I do not see where this was ever discussed with the patient are ordered for her.  I suspect we will have to repeat the sleep study to arrange BiPAP orders.  Will see if this is something patient is amenable to.  In the interim, I have given her printed orders for labs and she will get this collected along with her next set of labs with her cancer specialist.  Reinforced diet modification, increase physical exercise as tolerated to reduce body weight and I think this should improve energy.  No orders of the defined types were placed in this encounter.  No orders of the defined types were placed in this encounter.    Raliegh Ip, DO [facility] [phone]

## 2023-02-05 ENCOUNTER — Telehealth: Payer: Self-pay | Admitting: Hematology and Oncology

## 2023-02-05 ENCOUNTER — Encounter: Payer: Self-pay | Admitting: Gynecologic Oncology

## 2023-02-05 NOTE — Telephone Encounter (Signed)
Rescheduled appointments per patients request via incoming call. Patient is aware of the changes made to her upcoming appointments. 

## 2023-02-07 ENCOUNTER — Other Ambulatory Visit: Payer: Self-pay

## 2023-02-07 ENCOUNTER — Inpatient Hospital Stay: Payer: HMO | Attending: Gynecologic Oncology | Admitting: Gynecologic Oncology

## 2023-02-07 ENCOUNTER — Encounter: Payer: Self-pay | Admitting: Gynecologic Oncology

## 2023-02-07 VITALS — BP 138/77 | HR 68 | Temp 98.0°F | Resp 16 | Ht 62.0 in | Wt 236.0 lb

## 2023-02-07 DIAGNOSIS — Z90710 Acquired absence of both cervix and uterus: Secondary | ICD-10-CM | POA: Insufficient documentation

## 2023-02-07 DIAGNOSIS — Z08 Encounter for follow-up examination after completed treatment for malignant neoplasm: Secondary | ICD-10-CM

## 2023-02-07 DIAGNOSIS — R32 Unspecified urinary incontinence: Secondary | ICD-10-CM | POA: Diagnosis not present

## 2023-02-07 DIAGNOSIS — Z90722 Acquired absence of ovaries, bilateral: Secondary | ICD-10-CM | POA: Insufficient documentation

## 2023-02-07 DIAGNOSIS — R350 Frequency of micturition: Secondary | ICD-10-CM | POA: Insufficient documentation

## 2023-02-07 DIAGNOSIS — Z79899 Other long term (current) drug therapy: Secondary | ICD-10-CM | POA: Insufficient documentation

## 2023-02-07 DIAGNOSIS — Z8542 Personal history of malignant neoplasm of other parts of uterus: Secondary | ICD-10-CM | POA: Diagnosis not present

## 2023-02-07 DIAGNOSIS — Z9221 Personal history of antineoplastic chemotherapy: Secondary | ICD-10-CM | POA: Insufficient documentation

## 2023-02-07 DIAGNOSIS — C541 Malignant neoplasm of endometrium: Secondary | ICD-10-CM

## 2023-02-07 NOTE — Patient Instructions (Signed)
It was good to see you today.  I do not see or feel any evidence of cancer recurrence on your exam.  I will see you for follow-up in 3 months.  As always, if you develop any new and concerning symptoms before your next visit, please call to see me sooner.  

## 2023-02-07 NOTE — Progress Notes (Signed)
Gynecologic Oncology Return Clinic Visit  02/07/23  Reason for Visit: surveillance in the setting of high risk uterine cancer   Treatment History: Oncology History Overview Note  MSI stable, endometrioid grade 2 Neg genetics   Endometrial cancer (HCC)  12/19/2020 Initial Diagnosis   Endometrial cancer (HCC)   12/26/2020 Imaging   1. Lower uterine segment/cervical mass without evidence of metastatic disease. 2. Hepatic steatosis. 3. Aortic atherosclerosis (ICD10-I70.0). Coronary artery calcification.   12/29/2020 Pathology Results   FINAL MICROSCOPIC DIAGNOSIS:   A. SENTINEL LYMPH NODE, RIGHT OBTURATOR, EXCISION:  -  No carcinoma identified in one lymph node (0/1)  -  See comment   B. SENTINEL LYMPH NODE, LEFT OBTURATOR, EXCISION:  -  Micrometastasis involving one lymph node (77mi/1)  -  See comment   C. UTERUS, CERVIX, BILATERAL TUBES AND OVARIES:  Uterus:  -  Endometrioid carcinoma, FIGO grade 2  -  See oncology table and comment below   Cervix:  -  No carcinoma identified   Bilateral Ovaries:  -  No carcinoma identified   Bilateral Fallopian tubes:  -  No carcinoma identified   ONCOLOGY TABLE:   UTERUS, CARCINOMA OR CARCINOSARCOMA: Resection   Procedure: Total hysterectomy and bilateral salpingo-oophorectomy  Histologic Type: Endometrioid carcinoma  Histologic Grade: FIGO grade 2  Myometrial Invasion:       Percentage of Myometrial Invasion: Estimated to be less than 50%  Uterine Serosa Involvement: Not identified  Cervical stromal Involvement: Not identified  Extent of involvement of other tissue/organs: Not identified  Peritoneal/Ascitic Fluid: Not submitted/unknown  Lymphovascular Invasion: Not identified  Regional Lymph Nodes:       Pelvic Lymph Nodes Examined:  2 Sentinel                                0 non-sentinel                                   2 total       Pelvic Lymph Nodes with Metastasis: 0                           Macrometastasis: (>2.0  mm): 0                          Micrometastasis: (>0.2 mm and < 2.0 mm): 1                            Isolated Tumor Cells (<0.2 mm): 0                       Laterality of Lymph Node with Tumor: 0                             Extracapsular Extension: N/A  Distant Metastasis:       Distant Site(s) Involved: N/A  Pathologic Stage Classification (pTNM, AJCC 8th Edition): pT1a, pN76mi  Ancillary Studies: MMR / MSI testing will be ordered  Representative Tumor Block: C1  Comment(s): Cytokeratin AE1/3 was performed on the lymph nodes (parts a and B) and supports the presence of micrometastasis (part B only). Drs. Luisa Hart and [facility] reviewed select parts of the case and agree  with the above diagnosis.   (v4.2.0.1)     12/29/2020 Surgery   Surgeon: Quinn Axe Pre-operative Diagnosis: endometrial cancer grade 3    Operation: Robotic-assisted laparoscopic total hysterectomy with bilateral salpingoophorectomy, SLN biopsy   Surgeon: Quinn Axe  Specimens: uterus, cervix, bilateral tubes and ovaries, right and left obturator SLN           01/22/2021 Cancer Staging   Staging form: Corpus Uteri - Carcinoma and Carcinosarcoma, AJCC 8th Edition - Pathologic stage from 01/22/2021: Stage IIIC1 (pT1a, pN20mi, cM0) - Signed by Artis Delay, MD on 01/22/2021 Stage prefix: Initial diagnosis   01/25/2021 Procedure   Successful placement of a right IJ approach Power Port with ultrasound and fluoroscopic guidance. The catheter is ready for use.   02/07/2021 - 05/19/2021 Chemotherapy   Patient is on Treatment Plan : UTERINE Carboplatin AUC 6 / Paclitaxel q21d     02/11/2021 Genetic Testing   Negative hereditary cancer genetic testing: no pathogenic variants detected in [facility] Common Hereditary Cancers +RNA Panel.  The report date is February 11, 2021.    The Common Hereditary Cancers + RNA Panel offered by [facility] includes sequencing, deletion/duplication, and RNA testing of the following 47 genes:  APC, ATM, AXIN2, BARD1, BMPR1A, BRCA1, BRCA2, BRIP1, CDH1, CDK4*, CDKN2A (p14ARF)*, CDKN2A (p16INK4a)*, CHEK2, CTNNA1, DICER1, EPCAM (Deletion/duplication testing only), GREM1 (promoter region deletion/duplication testing only), KIT, MEN1, MLH1, MSH2, MSH3, MSH6, MUTYH, NBN, NF1, NHTL1, PALB2, PDGFRA*, PMS2, POLD1, POLE, PTEN, RAD50, RAD51C, RAD51D, SDHB, SDHC, SDHD, SMAD4, SMARCA4. STK11, TP53, TSC1, TSC2, and VHL.  The following genes were evaluated for sequence changes only: SDHA and HOXB13 c.251G>A variant only.  RNA analysis is not performed for the * genes.     06/14/2021 Imaging   1. Interval hysterectomy and bilateral salpingo oophorectomy. No findings of residual/recurrent malignancy. 2. Substantial lumbar spondylosis and degenerative disc disease. In addition, a right eccentric intervertebral spur at T8-9 causes prominent central narrowing of the thecal sac. 3. Other imaging findings of potential clinical significance: Aortic Atherosclerosis (ICD10-I70.0). Systemic atherosclerosis. Small type 1 hiatal hernia.     08/30/2022 Imaging   1. Status post hysterectomy without evidence of local recurrence. 2. No evidence of metastatic disease within the abdomen or pelvis. 3. Moderate volume of formed stool in the colon. Correlate for constipation. 4. Moderate-sized hiatal hernia. 5.  Aortic Atherosclerosis (ICD10-I70.0).    Interval History: Doing well.  Denies any vaginal bleeding.  She and her husband have both had some loose stools for the last few weeks.  Denies any change to urinary function.  Reports good appetite, denies any abdominal or pelvic pain.  Her grandson is getting married next month.  Past Medical/Surgical History: Past Medical History:  Diagnosis Date   Arthritis    Asthma    Diverticulosis    Dyspnea    with exertion    Endometrial hyperplasia    Esophageal stricture    Family history of breast cancer 01/26/2021   Foot pain    Fundic gland polyps of stomach, benign  07/10/2015   Gastritis    GERD (gastroesophageal reflux disease)    Hemorrhoid    Hiatal hernia    Hip pain    History of kidney stones    cystoscopy   History of surgery on arm    left humerus   Hyperlipidemia    Hypertension    Hypothyroidism    Knee pain    Metabolic syndrome    Obstructive sleep apnea 08/27/2017   pt  denies    Oxygen deficiency    pt denies   PONV (postoperative nausea and vomiting)    Spinal headache    few headaches recently    Tendonitis of ankle    Vitamin D deficiency     Past Surgical History:  Procedure Laterality Date   ANAL FISSURE REPAIR     CATARACT EXTRACTION  May 2009   COLONOSCOPY  12/26/07   ESOPHAGEAL DILATION     ESOPHAGOGASTRODUODENOSCOPY  12/26/07, 05/13/08   EYE SURGERY Bilateral    bilateral cataract with lens implants   IR IMAGING GUIDED PORT INSERTION  01/25/2021   KNEE CARTILAGE SURGERY Right Sept 1999   ORIF HUMERUS FRACTURE Left 01/12/2020   Procedure: OPEN REDUCTION INTERNAL FIXATION (ORIF) LEFT HUMERUS FRACTURE;  Surgeon: Dominica Severin, MD;  Location: [facility] OR;  Service: Orthopedics;  Laterality: Left;   ROBOTIC ASSISTED TOTAL HYSTERECTOMY WITH BILATERAL SALPINGO OOPHERECTOMY Bilateral 12/29/2020   Procedure: XI ROBOTIC ASSISTED TOTAL HYSTERECTOMY WITH BILATERAL SALPINGO OOPHORECTOMY;  Surgeon: Adolphus Birchwood, MD;  Location: [facility];  Service: Gynecology;  Laterality: Bilateral;   SENTINEL NODE BIOPSY N/A 12/29/2020   Procedure: SENTINEL NODE BIOPSY;  Surgeon: Adolphus Birchwood, MD;  Location: [facility];  Service: Gynecology;  Laterality: N/A;   TOTAL KNEE ARTHROPLASTY Right 10/07/2017   Procedure: RIGHT TOTAL KNEE ARTHROPLASTY;  Surgeon: Ollen Gross, MD;  Location: [facility];  Service: Orthopedics;  Laterality: Right;  Adductor Block   UMBILICAL HERNIA REPAIR  05/20/06    Family History  Problem Relation Age of Onset   Colon cancer Mother 30   Heart attack Father    Heart disease Sister    Breast cancer Sister 70   Breast cancer Sister         dx 48s   Cancer Maternal Aunt 78       "female cancer", unknown type   Cancer Maternal Grandmother        unknown type; dx 73s   Cancer Maternal Grandfather        unknown type; dx 18s   Cancer Cousin        maternal female cousin; dx 20s; unknown type   Cancer Cousin        maternal female cousin; dx 42s; unknown type   Cancer Cousin        maternal female cousin; dx after 64; unknown type   Esophageal cancer Neg Hx    Stomach cancer Neg Hx    Rectal cancer Neg Hx     Social History   Socioeconomic History   Marital status: Married    Spouse name: Maisie Fus   Number of children: 2   Years of education: 14   Highest education level: Associate degree: academic program  Occupational History   Occupation: Retired  Tobacco Use   Smoking status: Never   Smokeless tobacco: Never  Building services engineer Use: Never used  Substance and Sexual Activity   Alcohol use: No   Drug use: No   Sexual activity: Not Currently    Birth control/protection: Post-menopausal  Other Topics Concern   Not on file  Social History Narrative   Married and retired   No EtOH, tobacco or drug use   Social Determinants of Health   Financial Resource Strain: Low Risk  (05/12/2021)   Overall Financial Resource Strain (CARDIA)    Difficulty of Paying Living Expenses: Not hard at all  Food Insecurity: No Food Insecurity (05/12/2021)   Hunger Vital Sign    Worried About  Running Out of Food in the Last Year: Never true    Ran Out of Food in the Last Year: Never true  Transportation Needs: No Transportation Needs (05/12/2021)   PRAPARE - Administrator, Civil Service (Medical): No    Lack of Transportation (Non-Medical): No  Physical Activity: Inactive (05/12/2021)   Exercise Vital Sign    Days of Exercise per Week: 0 days    Minutes of Exercise per Session: 0 min  Stress: Stress Concern Present (05/12/2021)   [employer] of Occupational Health - Occupational Stress Questionnaire     Feeling of Stress : To some extent  Social Connections: Socially Integrated (05/12/2021)   Social Connection and Isolation Panel [NHANES]    Frequency of Communication with Friends and Family: More than three times a week    Frequency of Social Gatherings with Friends and Family: More than three times a week    Attends Religious Services: More than 4 times per year    Active Member of [employer] or Organizations: Yes    Attends Banker Meetings: 1 to 4 times per year    Marital Status: Married    Current Medications:  Current Outpatient Medications:    acetaminophen (TYLENOL) 500 MG tablet, Take 1,000 mg by mouth as needed (pain.)., Disp: , Rfl:    albuterol (VENTOLIN HFA) 108 (90 Base) MCG/ACT inhaler, Inhale 2 puffs into the lungs every 6 (six) hours as needed for wheezing or shortness of breath. (Patient taking differently: Inhale 2 puffs into the lungs as needed for wheezing or shortness of breath. PRN), Disp: 6.7 g, Rfl: 1   budesonide-formoterol (SYMBICORT) 80-4.5 MCG/ACT inhaler, Inhale 2 puffs into the lungs 2 (two) times daily. (Patient taking differently: Inhale 2 puffs into the lungs 2 (two) times daily as needed.), Disp: 3 each, Rfl: 11   Calcium Carbonate-Vit D-Min (CALCIUM 1200 PO), Take 1 tablet by mouth in the morning., Disp: , Rfl:    Cholecalciferol (VITAMIN D3) 2000 units TABS, Take 2,000 Units by mouth in the morning., Disp: , Rfl:    clobetasol ointment (TEMOVATE) 0.05 %, Apply 1 Application topically 3 (three) times a week. Apply cream to the area where you have had itching 3 times a week at night., Disp: 30 g, Rfl: 0   DULoxetine (CYMBALTA) 30 MG capsule, Take 2 capsules (60 mg total) by mouth daily., Disp: 90 capsule, Rfl: 3   furosemide (LASIX) 20 MG tablet, TAKE 1 TABLET BY MOUTH DAILY AS  NEEDED FOR FLUID, Disp: 100 tablet, Rfl: 0   levothyroxine (SYNTHROID) 50 MCG tablet, Take 1 tablet (50 mcg total) by mouth daily before breakfast., Disp: 90 tablet, Rfl: 3    lidocaine-prilocaine (EMLA) cream, Apply to affected area once, Disp: 30 g, Rfl: 3   losartan-hydrochlorothiazide (HYZAAR) 100-12.5 MG tablet, Take 1 tablet by mouth daily., Disp: 100 tablet, Rfl: 3   mirabegron ER (MYRBETRIQ) 50 MG TB24 tablet, Take 1 tablet (50 mg total) by mouth daily., Disp: 30 tablet, Rfl: 0   Omega-3 Fatty Acids (FISH OIL) 1000 MG CAPS, Take 1,000 mg by mouth in the morning., Disp: , Rfl:    oxybutynin (DITROPAN) 5 MG tablet, TAKE 2 TABLETS BY MOUTH TWICE  DAILY, Disp: 400 tablet, Rfl: 1   pantoprazole (PROTONIX) 40 MG tablet, Take 1 tablet (40 mg total) by mouth 2 (two) times daily before a meal., Disp: 200 tablet, Rfl: 3   vitamin B-12 (CYANOCOBALAMIN) 1000 MCG tablet, Take 1,000 mcg by mouth in the  morning., Disp: , Rfl:    cephALEXin (KEFLEX) 500 MG capsule, Take 1 capsule (500 mg total) by mouth 4 (four) times daily. (Patient not taking: Reported on 02/05/2023), Disp: 28 capsule, Rfl: 0   valACYclovir (VALTREX) 1000 MG tablet, TAKE 1 TABLET BY MOUTH TWICE  DAILY FOR 7 DAYS; IF HAVING  ANOTHER FLARE, TAKE 2 TABLETS  TWICE DAILY FOR 1 DAY ONLY PER  FLAREUP (Patient not taking: Reported on 02/05/2023), Disp: 100 tablet, Rfl: 0  Review of Systems: + Fatigue, incontinence, easy bruising/bleeding. Denies appetite changes, fevers, chills, unexplained weight changes. Denies hearing loss, neck lumps or masses, mouth sores, ringing in ears or voice changes. Denies cough or wheezing.  Denies shortness of breath. Denies chest pain or palpitations. Denies leg swelling. Denies abdominal distention, pain, blood in stools, constipation, diarrhea, nausea, vomiting, or early satiety. Denies pain with intercourse, dysuria, frequency, hematuria. Denies hot flashes, pelvic pain, vaginal bleeding or vaginal discharge.   Denies joint pain, back pain or muscle pain/cramps. Denies itching, rash, or wounds. Denies dizziness, headaches, numbness or seizures. Denies swollen lymph nodes or  glands. Denies anxiety, depression, confusion, or decreased concentration.  Physical Exam: BP 138/77 (BP Location: Right Arm, Patient Position: Sitting)   Pulse 68   Temp 98 F (36.7 C) (Oral)   Resp 16   Ht 5\' 2"  (1.575 m)   Wt 236 lb (107 kg)   SpO2 97% Comment: RA  BMI 43.16 kg/m  General: Alert, oriented, no acute distress. HEENT: Normocephalic, atraumatic, sclera anicteric. Some white patches on her tongue. Chest: Clear to auscultation bilaterally.  No wheezes or rhonchi. Cardiovascular: Regular rate and rhythm, no murmurs. Abdomen: Obese, soft, nontender.  Normoactive bowel sounds.  No masses or hepatosplenomegaly appreciated.  Well-healed incisions. Extremities: Grossly normal range of motion.  Warm, well perfused.  No edema bilaterally. Skin: No rashes or lesions noted. Lymphatics: No cervical, supraclavicular, or inguinal adenopathy. GU: Normal appearing external genitalia without erythema, excoriation, or lesions.  No loss of architecture, no visible lesions along the right labia on exam today.  Speculum exam reveals moderately atrophic vaginal mucosa, no lesions or masses, no bleeding or discharge noted.  Laxity of the vaginal wall noted.  Bimanual exam reveals cuff and vaginal mucosa smooth, no nodularity.  Rectovaginal exam confirms these findings.    Laboratory & Radiologic Studies: None new  Assessment & Plan: Stacie Russell is a 72 y.o. 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  Completed adjuvant chemotherapy in 05/2021.  Posttreatment imaging shows no evidence of disease. Last imaging 08/2022.   Patient is overall doing well and is NED on exam today.   She continues to have urinary frequency and incontinence, has some improvement with oxybutynin.  Symptoms stable.   I will plan to see her back in 3 months for surveillance visit.  Discussed signs and symptoms that would be concerning for cancer recurrence and stressed  the importance of calling if she develops any of these.  20 minutes of total time was spent for this patient encounter, including preparation, face-to-face counseling with the patient and coordination of care, and documentation of the encounter.  Eugene Garnet, MD  Division of Gynecologic Oncology  Department of Obstetrics and Gynecology  [facility]

## 2023-02-26 ENCOUNTER — Inpatient Hospital Stay: Payer: HMO

## 2023-02-26 ENCOUNTER — Inpatient Hospital Stay: Payer: HMO | Admitting: Hematology and Oncology

## 2023-03-05 ENCOUNTER — Encounter: Payer: Self-pay | Admitting: Hematology and Oncology

## 2023-03-05 ENCOUNTER — Encounter: Payer: Self-pay | Admitting: Family Medicine

## 2023-03-05 ENCOUNTER — Inpatient Hospital Stay: Payer: HMO | Attending: Gynecologic Oncology

## 2023-03-05 ENCOUNTER — Other Ambulatory Visit: Payer: Self-pay

## 2023-03-05 ENCOUNTER — Inpatient Hospital Stay (HOSPITAL_BASED_OUTPATIENT_CLINIC_OR_DEPARTMENT_OTHER): Payer: HMO | Admitting: Hematology and Oncology

## 2023-03-05 ENCOUNTER — Other Ambulatory Visit: Payer: Self-pay | Admitting: Family Medicine

## 2023-03-05 ENCOUNTER — Ambulatory Visit (INDEPENDENT_AMBULATORY_CARE_PROVIDER_SITE_OTHER): Payer: HMO | Admitting: Family Medicine

## 2023-03-05 VITALS — BP 109/61 | HR 72 | Temp 98.1°F | Resp 18 | Ht 62.0 in | Wt 240.6 lb

## 2023-03-05 VITALS — BP 131/73 | HR 79 | Temp 98.1°F | Ht 62.0 in | Wt 238.2 lb

## 2023-03-05 DIAGNOSIS — C541 Malignant neoplasm of endometrium: Secondary | ICD-10-CM

## 2023-03-05 DIAGNOSIS — I10 Essential (primary) hypertension: Secondary | ICD-10-CM | POA: Diagnosis not present

## 2023-03-05 DIAGNOSIS — I700 Atherosclerosis of aorta: Secondary | ICD-10-CM | POA: Diagnosis not present

## 2023-03-05 DIAGNOSIS — E66813 Obesity, class 3: Secondary | ICD-10-CM

## 2023-03-05 DIAGNOSIS — E039 Hypothyroidism, unspecified: Secondary | ICD-10-CM

## 2023-03-05 DIAGNOSIS — Z6841 Body Mass Index (BMI) 40.0 and over, adult: Secondary | ICD-10-CM | POA: Diagnosis not present

## 2023-03-05 DIAGNOSIS — Z885 Allergy status to narcotic agent status: Secondary | ICD-10-CM | POA: Insufficient documentation

## 2023-03-05 DIAGNOSIS — F419 Anxiety disorder, unspecified: Secondary | ICD-10-CM

## 2023-03-05 DIAGNOSIS — K449 Diaphragmatic hernia without obstruction or gangrene: Secondary | ICD-10-CM | POA: Insufficient documentation

## 2023-03-05 DIAGNOSIS — Z79899 Other long term (current) drug therapy: Secondary | ICD-10-CM | POA: Diagnosis not present

## 2023-03-05 DIAGNOSIS — M47816 Spondylosis without myelopathy or radiculopathy, lumbar region: Secondary | ICD-10-CM | POA: Insufficient documentation

## 2023-03-05 DIAGNOSIS — Z90710 Acquired absence of both cervix and uterus: Secondary | ICD-10-CM | POA: Insufficient documentation

## 2023-03-05 DIAGNOSIS — Z08 Encounter for follow-up examination after completed treatment for malignant neoplasm: Secondary | ICD-10-CM | POA: Diagnosis not present

## 2023-03-05 DIAGNOSIS — Z8542 Personal history of malignant neoplasm of other parts of uterus: Secondary | ICD-10-CM | POA: Insufficient documentation

## 2023-03-05 DIAGNOSIS — Z713 Dietary counseling and surveillance: Secondary | ICD-10-CM | POA: Diagnosis not present

## 2023-03-05 LAB — CMP (CANCER CENTER ONLY)
ALT: 15 U/L (ref 0–44)
AST: 13 U/L — ABNORMAL LOW (ref 15–41)
Albumin: 4 g/dL (ref 3.5–5.0)
Alkaline Phosphatase: 86 U/L (ref 38–126)
Anion gap: 6 (ref 5–15)
BUN: 19 mg/dL (ref 8–23)
CO2: 29 mmol/L (ref 22–32)
Calcium: 9.7 mg/dL (ref 8.9–10.3)
Chloride: 104 mmol/L (ref 98–111)
Creatinine: 0.77 mg/dL (ref 0.44–1.00)
GFR, Estimated: >60 mL/min (ref >60)
Glucose, Bld: 96 mg/dL (ref 70–99)
Potassium: 3.7 mmol/L (ref 3.5–5.1)
Sodium: 139 mmol/L (ref 135–145)
Total Bilirubin: 0.6 mg/dL (ref 0.3–1.2)
Total Protein: 6.6 g/dL (ref 6.5–8.1)

## 2023-03-05 LAB — CBC WITH DIFFERENTIAL (CANCER CENTER ONLY)
Abs Immature Granulocytes: 0.02 10*3/uL (ref 0.00–0.07)
Basophils Absolute: 0 10*3/uL (ref 0.0–0.1)
Basophils Relative: 1 %
Eosinophils Absolute: 0.2 10*3/uL (ref 0.0–0.5)
Eosinophils Relative: 4 %
HCT: 39.3 % (ref 36.0–46.0)
Hemoglobin: 13.6 g/dL (ref 12.0–15.0)
Immature Granulocytes: 0 %
Lymphocytes Relative: 33 %
Lymphs Abs: 2.1 10*3/uL (ref 0.7–4.0)
MCH: 32.2 pg (ref 26.0–34.0)
MCHC: 34.6 g/dL (ref 30.0–36.0)
MCV: 93.1 fL (ref 80.0–100.0)
Monocytes Absolute: 0.5 10*3/uL (ref 0.1–1.0)
Monocytes Relative: 8 %
Neutro Abs: 3.5 10*3/uL (ref 1.7–7.7)
Neutrophils Relative %: 54 %
Platelet Count: 225 10*3/uL (ref 150–400)
RBC: 4.22 MIL/uL (ref 3.87–5.11)
RDW: 13 % (ref 11.5–15.5)
WBC Count: 6.3 10*3/uL (ref 4.0–10.5)
nRBC: 0 % (ref 0.0–0.2)

## 2023-03-05 MED ORDER — PANTOPRAZOLE SODIUM 40 MG PO TBEC: 40.0000 mg | DELAYED_RELEASE_TABLET | Freq: Two times a day (BID) | ORAL | 3 refills | Status: AC

## 2023-03-05 MED ORDER — HEPARIN SOD (PORK) LOCK FLUSH 100 UNIT/ML IV SOLN
500.0000 [IU] | Freq: Once | INTRAVENOUS | Status: AC
  Administered 2023-03-05: 500 [IU]

## 2023-03-05 MED ORDER — SODIUM CHLORIDE 0.9% FLUSH
10.0000 mL | Freq: Once | INTRAVENOUS | Status: AC
  Administered 2023-03-05: 10 mL

## 2023-03-05 MED ORDER — BUSPIRONE HCL 7.5 MG PO TABS
7.5000 mg | ORAL_TABLET | Freq: Two times a day (BID) | ORAL | 3 refills | Status: AC
Start: 2023-03-05 — End: ?

## 2023-03-05 NOTE — Progress Notes (Signed)
Subjective: CC: Anxiety = PCP: Raliegh Ip, DO QMV:[id number] Stacie Russell is a 72 y.o. female presenting to clinic today for:  1.  Anxiety Patient reports that she has been having episodes of anxiety.  She utilize some of her husbands buspirone and notes this really made a big difference.  She would like to get a prescription of her own.  2.  GERD Patient notes that she never did receive her pantoprazole despite this being sent last visit in June.  She reports acid reflux symptoms.  3.  Morbid obesity Patient is very interested in potentially pursuing GIP or GLP treatment for morbid obesity.  There is no personal or family history of medullary thyroid cancer, multiple endocrine type II neoplasia syndrome or pancreatitis.  She does report overeating and often eating the wrong things.  She is try diet modification in the past but this is never been sustainable.  Her mobility sometimes is limited due to the morbid obesity and she reports fatigue secondary to this as well.   ROS: Per HPI  Allergies  Allergen Reactions   Tramadol Other (See Comments)    Unsure of reaction   Past Medical History:  Diagnosis Date   Arthritis    Asthma    Diverticulosis    Dyspnea    with exertion    Endometrial hyperplasia    Esophageal stricture    Family history of breast cancer 01/26/2021   Foot pain    Fundic gland polyps of stomach, benign 07/10/2015   Gastritis    GERD (gastroesophageal reflux disease)    Hemorrhoid    Hiatal hernia    Hip pain    History of kidney stones    cystoscopy   History of surgery on arm    left humerus   Hyperlipidemia    Hypertension    Hypothyroidism    Knee pain    Metabolic syndrome    Obstructive sleep apnea 08/27/2017   pt denies    Oxygen deficiency    pt denies   PONV (postoperative nausea and vomiting)    Spinal headache    few headaches recently    Tendonitis of ankle    Vitamin D deficiency     Current Outpatient Medications:     acetaminophen (TYLENOL) 500 MG tablet, Take 1,000 mg by mouth as needed (pain.)., Disp: , Rfl:    albuterol (VENTOLIN HFA) 108 (90 Base) MCG/ACT inhaler, Inhale 2 puffs into the lungs every 6 (six) hours as needed for wheezing or shortness of breath. (Patient taking differently: Inhale 2 puffs into the lungs as needed for wheezing or shortness of breath. PRN), Disp: 6.7 g, Rfl: 1   budesonide-formoterol (SYMBICORT) 80-4.5 MCG/ACT inhaler, Inhale 2 puffs into the lungs 2 (two) times daily. (Patient taking differently: Inhale 2 puffs into the lungs 2 (two) times daily as needed.), Disp: 3 each, Rfl: 11   Calcium Carbonate-Vit D-Min (CALCIUM 1200 PO), Take 1 tablet by mouth in the morning., Disp: , Rfl:    cephALEXin (KEFLEX) 500 MG capsule, Take 1 capsule (500 mg total) by mouth 4 (four) times daily. (Patient not taking: Reported on 02/05/2023), Disp: 28 capsule, Rfl: 0   Cholecalciferol (VITAMIN D3) 2000 units TABS, Take 2,000 Units by mouth in the morning., Disp: , Rfl:    clobetasol ointment (TEMOVATE) 0.05 %, Apply 1 Application topically 3 (three) times a week. Apply cream to the area where you have had itching 3 times a week at night., Disp: 30 g, Rfl:  0   DULoxetine (CYMBALTA) 30 MG capsule, Take 2 capsules (60 mg total) by mouth daily., Disp: 90 capsule, Rfl: 3   furosemide (LASIX) 20 MG tablet, TAKE 1 TABLET BY MOUTH DAILY AS  NEEDED FOR FLUID, Disp: 100 tablet, Rfl: 0   levothyroxine (SYNTHROID) 50 MCG tablet, Take 1 tablet (50 mcg total) by mouth daily before breakfast., Disp: 90 tablet, Rfl: 3   lidocaine-prilocaine (EMLA) cream, Apply to affected area once, Disp: 30 g, Rfl: 3   losartan-hydrochlorothiazide (HYZAAR) 100-12.5 MG tablet, Take 1 tablet by mouth daily., Disp: 100 tablet, Rfl: 3   mirabegron ER (MYRBETRIQ) 50 MG TB24 tablet, Take 1 tablet (50 mg total) by mouth daily., Disp: 30 tablet, Rfl: 0   Omega-3 Fatty Acids (FISH OIL) 1000 MG CAPS, Take 1,000 mg by mouth in the morning.,  Disp: , Rfl:    oxybutynin (DITROPAN) 5 MG tablet, TAKE 2 TABLETS BY MOUTH TWICE  DAILY, Disp: 400 tablet, Rfl: 1   pantoprazole (PROTONIX) 40 MG tablet, Take 1 tablet (40 mg total) by mouth 2 (two) times daily before a meal., Disp: 200 tablet, Rfl: 3   valACYclovir (VALTREX) 1000 MG tablet, TAKE 1 TABLET BY MOUTH TWICE  DAILY FOR 7 DAYS; IF HAVING  ANOTHER FLARE, TAKE 2 TABLETS  TWICE DAILY FOR 1 DAY ONLY PER  FLAREUP (Patient not taking: Reported on 02/05/2023), Disp: 100 tablet, Rfl: 0   vitamin B-12 (CYANOCOBALAMIN) 1000 MCG tablet, Take 1,000 mcg by mouth in the morning., Disp: , Rfl:  Social History   Socioeconomic History   Marital status: Married    Spouse name: Maisie Fus   Number of children: 2   Years of education: 14   Highest education level: Associate degree: academic program  Occupational History   Occupation: Retired  Tobacco Use   Smoking status: Never   Smokeless tobacco: Never  Vaping Use   Vaping status: Never Used  Substance and Sexual Activity   Alcohol use: No   Drug use: No   Sexual activity: Not Currently    Birth control/protection: Post-menopausal  Other Topics Concern   Not on file  Social History Narrative   Married and retired   No EtOH, tobacco or drug use   Social Determinants of Health   Financial Resource Strain: Low Risk  (05/12/2021)   Overall Financial Resource Strain (CARDIA)    Difficulty of Paying Living Expenses: Not hard at all  Food Insecurity: No Food Insecurity (05/12/2021)   Hunger Vital Sign    Worried About Running Out of Food in the Last Year: Never true    Ran Out of Food in the Last Year: Never true  Transportation Needs: No Transportation Needs (05/12/2021)   PRAPARE - Administrator, Civil Service (Medical): No    Lack of Transportation (Non-Medical): No  Physical Activity: Inactive (05/12/2021)   Exercise Vital Sign    Days of Exercise per Week: 0 days    Minutes of Exercise per Session: 0 min  Stress: Stress  Concern Present (05/12/2021)   [employer] of Occupational Health - Occupational Stress Questionnaire    Feeling of Stress : To some extent  Social Connections: Socially Integrated (05/12/2021)   Social Connection and Isolation Panel [NHANES]    Frequency of Communication with Friends and Family: More than three times a week    Frequency of Social Gatherings with Friends and Family: More than three times a week    Attends Religious Services: More than 4 times per year  Active Member of Clubs or Organizations: Yes    Attends Banker Meetings: 1 to 4 times per year    Marital Status: Married  Catering manager Violence: Not At Risk (05/12/2021)   Humiliation, Afraid, Rape, and Kick questionnaire    Fear of Current or Ex-Partner: No    Emotionally Abused: No    Physically Abused: No    Sexually Abused: No   Family History  Problem Relation Age of Onset   Colon cancer Mother 60   Heart attack Father    Heart disease Sister    Breast cancer Sister 26   Breast cancer Sister        dx 66s   Cancer Maternal Aunt 34       "female cancer", unknown type   Cancer Maternal Grandmother        unknown type; dx 21s   Cancer Maternal Grandfather        unknown type; dx 27s   Cancer Cousin        maternal female cousin; dx 49s; unknown type   Cancer Cousin        maternal female cousin; dx 26s; unknown type   Cancer Cousin        maternal female cousin; dx after 31; unknown type   Esophageal cancer Neg Hx    Stomach cancer Neg Hx    Rectal cancer Neg Hx     Objective: Office vital signs reviewed. BP 131/73   Pulse 79   Temp 98.1 F (36.7 C)   Ht 5\' 2"  (1.575 m)   Wt 238 lb 3.2 oz (108 kg)   SpO2 92%   BMI 43.57 kg/m   Physical Examination:  General: Awake, alert, morbidly obese, No acute distress HEENT: No exophthalmos.  No goiter Cardio: regular rate and rhythm, S1S2 heard, no murmurs appreciated Pulm: clear to auscultation bilaterally, no wheezes, rhonchi or  rales; normal work of breathing on room air Psych: Mood stable, speech normal, affect appropriate.  Very pleasant, interactive   Assessment/ Plan: 72 y.o. female   Obesity, Class III, BMI 40-49.9 (morbid obesity) (HCC) - Plan: Hemoglobin A1c, CANCELED: Bayer DCA Hb A1c Waived  Weight loss counseling, encounter for  Anxiety - Plan: busPIRone (BUSPAR) 7.5 MG tablet  Endometrial cancer (HCC) - Plan: CBC, VITAMIN D 25 Hydroxy (Vit-D Deficiency, Fractures), CANCELED: CBC, CANCELED: VITAMIN D 25 Hydroxy (Vit-D Deficiency, Fractures)  Essential hypertension, benign - Plan: CMP14+EGFR, CANCELED: CMP14+EGFR  Acquired hypothyroidism - Plan: CMP14+EGFR, T4, free, TSH, CANCELED: CMP14+EGFR, CANCELED: TSH, CANCELED: T4, free  Aortic atherosclerosis (HCC) - Plan: CMP14+EGFR, Lipid panel, CANCELED: CMP14+EGFR, CANCELED: Lipid panel  I counseled her on risk versus benefits of GIP and GLP class.  I know that her insurance will not cover this at this time as she is a nondiabetic.  I have placed future orders to be collected with her next hematology/oncology visit.  If this demonstrates type 2 diabetes of course we can proceed with this injectable through insurance.  Otherwise, she is going to consider having this compounded in [facility].  We discussed the potential side effects of medication and indications for discontinuation.  She will let me know if she wishes to proceed with medication.  I would like to see her back in 3 to 4 months for weight check and ongoing monitoring of new start of anxiety medicine.  See below  Buspirone 7.5 mg prescribed.  She will follow-up with me in the next 3 to 4 months for ongoing  surveillance and management  Blood pressure is controlled.  Continue current regimen.  Fasting labs ordered.  Check thyroid levels.  Did not discuss thyroid today  PPI recent to pharmacy along with BuSpar as above  No orders of the defined types were placed in this encounter.  No orders of  the defined types were placed in this encounter.    Raliegh Ip, DO [facility] [phone]

## 2023-03-05 NOTE — Patient Instructions (Signed)
Tips for success with Semaglutide (and by success, how not to be super sick on your stomach): Eat small meals AVOID heavy foods (fried/ high in carbs like bread, pasta, rice) AVOID carbonated beverages (soda/ beer, as these can increase bloating) DOUBLE your water intake (will help you avoid constipation/ dehydration)  Semaglutide CAN cause: Nausea Abdominal pain Increased acid reflux (sometimes presents as "sour burps") Constipation OR Diarrhea Fatigue (especially when you first start it)  

## 2023-03-05 NOTE — Assessment & Plan Note (Signed)
Clinically, she has no signs or symptoms to suggest cancer recurrence However, examination is limited due to central obesity Due to high risk disease, I plan to repeat CT imaging in 6 months Once we have reached 2 years cancer free interval, we will get her port removed

## 2023-03-05 NOTE — Assessment & Plan Note (Signed)
She has progressive weight gain The patient has stopped exercising and identified many barriers She stated that her primary care doctor might have suggestion with weight loss medicines I encouraged the patient to try to schedule regular exercise and to proceed with weight loss medicine as indicated We discussed risk and association of cancer relapse with her obesity

## 2023-03-05 NOTE — Progress Notes (Signed)
[facility] OFFICE PROGRESS NOTE  Patient Care Team: Raliegh Ip, DO as PCP - General (Family Medicine) Runell Gess, MD as PCP - Cardiology (Cardiology) Bjorn Pippin, MD (Urology) Huel Cote, MD (Obstetrics and Gynecology) Iva Boop, MD (Gastroenterology) Ollen Gross, MD as Consulting Physician (Orthopedic Surgery) Artis Delay, MD as Consulting Physician (Hematology and Oncology)  ASSESSMENT & PLAN:  Endometrial cancer [facility]) Clinically, she has no signs or symptoms to suggest cancer recurrence However, examination is limited due to central obesity Due to high risk disease, I plan to repeat CT imaging in 6 months Once we have reached 2 years cancer free interval, we will get her port removed  Obesity, Class III, BMI 40-49.9 (morbid obesity) (HCC) She has progressive weight gain The patient has stopped exercising and identified many barriers She stated that her primary care doctor might have suggestion with weight loss medicines I encouraged the patient to try to schedule regular exercise and to proceed with weight loss medicine as indicated We discussed risk and association of cancer relapse with her obesity  Orders Placed This Encounter  Procedures   CT ABDOMEN PELVIS W CONTRAST    Standing Status:   Future    Standing Expiration Date:   03/04/2024    Order Specific Question:   If indicated for the ordered procedure, I authorize the administration of contrast media per Radiology protocol    Answer:   Yes    Order Specific Question:   Does the patient have a contrast media/X-ray dye allergy?    Answer:   No    Order Specific Question:   Preferred imaging location?    Answer:   [facility]    Order Specific Question:   If indicated for the ordered procedure, I authorize the administration of oral contrast media per Radiology protocol    Answer:   Yes   CBC with Differential/Platelet    Standing Status:   Standing    Number of  Occurrences:   22    Standing Expiration Date:   03/04/2024   Comprehensive metabolic panel    Standing Status:   Standing    Number of Occurrences:   33    Standing Expiration Date:   03/04/2024    All questions were answered. The patient knows to call the clinic with any problems, questions or concerns. The total time spent in the appointment was 25 minutes encounter with patients including review of chart and various tests results, discussions about plan of care and coordination of care plan   Artis Delay, MD 03/05/2023 1:23 PM  INTERVAL HISTORY: Please see below for problem oriented charting. she returns for surveillance follow-up She is not symptomatic with no signs and symptoms of cancer recurrence She is concerned about her progressive weight gain She leads a very sedentary lifestyle  REVIEW OF SYSTEMS:   Constitutional: Denies fevers, chills or abnormal weight loss Eyes: Denies blurriness of vision Ears, nose, mouth, throat, and face: Denies mucositis or sore throat Respiratory: Denies cough, dyspnea or wheezes Cardiovascular: Denies palpitation, chest discomfort or lower extremity swelling Gastrointestinal:  Denies nausea, heartburn or change in bowel habits Skin: Denies abnormal skin rashes Lymphatics: Denies new lymphadenopathy or easy bruising Neurological:Denies numbness, tingling or new weaknesses Behavioral/Psych: Mood is stable, no new changes  All other systems were reviewed with the patient and are negative.  I have reviewed the past medical history, past surgical history, social history and family history with the patient and they are  unchanged from previous note.  ALLERGIES:  is allergic to tramadol.  MEDICATIONS:  Current Outpatient Medications  Medication Sig Dispense Refill   acetaminophen (TYLENOL) 500 MG tablet Take 1,000 mg by mouth as needed (pain.).     albuterol (VENTOLIN HFA) 108 (90 Base) MCG/ACT inhaler Inhale 2 puffs into the lungs every 6 (six)  hours as needed for wheezing or shortness of breath. (Patient taking differently: Inhale 2 puffs into the lungs as needed for wheezing or shortness of breath. PRN) 6.7 g 1   budesonide-formoterol (SYMBICORT) 80-4.5 MCG/ACT inhaler Inhale 2 puffs into the lungs 2 (two) times daily. (Patient taking differently: Inhale 2 puffs into the lungs 2 (two) times daily as needed.) 3 each 11   busPIRone (BUSPAR) 7.5 MG tablet Take 1 tablet (7.5 mg total) by mouth 2 (two) times daily. 180 tablet 3   Calcium Carbonate-Vit D-Min (CALCIUM 1200 PO) Take 1 tablet by mouth in the morning.     cephALEXin (KEFLEX) 500 MG capsule Take 1 capsule (500 mg total) by mouth 4 (four) times daily. 28 capsule 0   Cholecalciferol (VITAMIN D3) 2000 units TABS Take 2,000 Units by mouth in the morning.     clobetasol ointment (TEMOVATE) 0.05 % Apply 1 Application topically 3 (three) times a week. Apply cream to the area where you have had itching 3 times a week at night. 30 g 0   DULoxetine (CYMBALTA) 30 MG capsule Take 2 capsules (60 mg total) by mouth daily. 90 capsule 3   furosemide (LASIX) 20 MG tablet TAKE 1 TABLET BY MOUTH DAILY AS  NEEDED FOR FLUID 100 tablet 0   levothyroxine (SYNTHROID) 50 MCG tablet Take 1 tablet (50 mcg total) by mouth daily before breakfast. 90 tablet 3   lidocaine-prilocaine (EMLA) cream Apply to affected area once 30 g 3   losartan-hydrochlorothiazide (HYZAAR) 100-12.5 MG tablet Take 1 tablet by mouth daily. 100 tablet 3   mirabegron ER (MYRBETRIQ) 50 MG TB24 tablet Take 1 tablet (50 mg total) by mouth daily. 30 tablet 0   Omega-3 Fatty Acids (FISH OIL) 1000 MG CAPS Take 1,000 mg by mouth in the morning.     oxybutynin (DITROPAN) 5 MG tablet TAKE 2 TABLETS BY MOUTH TWICE  DAILY 400 tablet 1   pantoprazole (PROTONIX) 40 MG tablet Take 1 tablet (40 mg total) by mouth 2 (two) times daily before a meal. 200 tablet 3   valACYclovir (VALTREX) 1000 MG tablet TAKE 1 TABLET BY MOUTH TWICE  DAILY FOR 7 DAYS; IF  HAVING  ANOTHER FLARE, TAKE 2 TABLETS  TWICE DAILY FOR 1 DAY ONLY PER  FLAREUP 100 tablet 0   vitamin B-12 (CYANOCOBALAMIN) 1000 MCG tablet Take 1,000 mcg by mouth in the morning.     No current facility-administered medications for this visit.    SUMMARY OF ONCOLOGIC HISTORY: Oncology History Overview Note  MSI stable, endometrioid grade 2 Neg genetics   Endometrial cancer (HCC)  12/19/2020 Initial Diagnosis   Endometrial cancer (HCC)   12/26/2020 Imaging   1. Lower uterine segment/cervical mass without evidence of metastatic disease. 2. Hepatic steatosis. 3. Aortic atherosclerosis (ICD10-I70.0). Coronary artery calcification.   12/29/2020 Pathology Results   FINAL MICROSCOPIC DIAGNOSIS:   A. SENTINEL LYMPH NODE, RIGHT OBTURATOR, EXCISION:  -  No carcinoma identified in one lymph node (0/1)  -  See comment   B. SENTINEL LYMPH NODE, LEFT OBTURATOR, EXCISION:  -  Micrometastasis involving one lymph node (50mi/1)  -  See comment   C.  UTERUS, CERVIX, BILATERAL TUBES AND OVARIES:  Uterus:  -  Endometrioid carcinoma, FIGO grade 2  -  See oncology table and comment below   Cervix:  -  No carcinoma identified   Bilateral Ovaries:  -  No carcinoma identified   Bilateral Fallopian tubes:  -  No carcinoma identified   ONCOLOGY TABLE:   UTERUS, CARCINOMA OR CARCINOSARCOMA: Resection   Procedure: Total hysterectomy and bilateral salpingo-oophorectomy  Histologic Type: Endometrioid carcinoma  Histologic Grade: FIGO grade 2  Myometrial Invasion:       Percentage of Myometrial Invasion: Estimated to be less than 50%  Uterine Serosa Involvement: Not identified  Cervical stromal Involvement: Not identified  Extent of involvement of other tissue/organs: Not identified  Peritoneal/Ascitic Fluid: Not submitted/unknown  Lymphovascular Invasion: Not identified  Regional Lymph Nodes:       Pelvic Lymph Nodes Examined:  2 Sentinel                                0 non-sentinel                                    2 total       Pelvic Lymph Nodes with Metastasis: 0                           Macrometastasis: (>2.0 mm): 0                          Micrometastasis: (>0.2 mm and < 2.0 mm): 1                            Isolated Tumor Cells (<0.2 mm): 0                       Laterality of Lymph Node with Tumor: 0                             Extracapsular Extension: N/A  Distant Metastasis:       Distant Site(s) Involved: N/A  Pathologic Stage Classification (pTNM, AJCC 8th Edition): pT1a, pN65mi  Ancillary Studies: MMR / MSI testing will be ordered  Representative Tumor Block: C1  Comment(s): Cytokeratin AE1/3 was performed on the lymph nodes (parts a and B) and supports the presence of micrometastasis (part B only). Drs. Luisa Hart and [facility] reviewed select parts of the case and agree with the above diagnosis.   (v4.2.0.1)     12/29/2020 Surgery   Surgeon: Quinn Axe Pre-operative Diagnosis: endometrial cancer grade 3    Operation: Robotic-assisted laparoscopic total hysterectomy with bilateral salpingoophorectomy, SLN biopsy   Surgeon: Quinn Axe  Specimens: uterus, cervix, bilateral tubes and ovaries, right and left obturator SLN           01/22/2021 Cancer Staging   Staging form: Corpus Uteri - Carcinoma and Carcinosarcoma, AJCC 8th Edition - Pathologic stage from 01/22/2021: Stage IIIC1 (pT1a, pN11mi, cM0) - Signed by Artis Delay, MD on 01/22/2021 Stage prefix: Initial diagnosis   01/25/2021 Procedure   Successful placement of a right IJ approach Power Port with ultrasound and fluoroscopic guidance. The catheter is ready for use.   02/07/2021 - 05/19/2021 Chemotherapy  Patient is on Treatment Plan : UTERINE Carboplatin AUC 6 / Paclitaxel q21d     02/11/2021 Genetic Testing   Negative hereditary cancer genetic testing: no pathogenic variants detected in [facility] Common Hereditary Cancers +RNA Panel.  The report date is February 11, 2021.    The Common  Hereditary Cancers + RNA Panel offered by [facility] includes sequencing, deletion/duplication, and RNA testing of the following 47 genes: APC, ATM, AXIN2, BARD1, BMPR1A, BRCA1, BRCA2, BRIP1, CDH1, CDK4*, CDKN2A (p14ARF)*, CDKN2A (p16INK4a)*, CHEK2, CTNNA1, DICER1, EPCAM (Deletion/duplication testing only), GREM1 (promoter region deletion/duplication testing only), KIT, MEN1, MLH1, MSH2, MSH3, MSH6, MUTYH, NBN, NF1, NHTL1, PALB2, PDGFRA*, PMS2, POLD1, POLE, PTEN, RAD50, RAD51C, RAD51D, SDHB, SDHC, SDHD, SMAD4, SMARCA4. STK11, TP53, TSC1, TSC2, and VHL.  The following genes were evaluated for sequence changes only: SDHA and HOXB13 c.251G>A variant only.  RNA analysis is not performed for the * genes.     06/14/2021 Imaging   1. Interval hysterectomy and bilateral salpingo oophorectomy. No findings of residual/recurrent malignancy. 2. Substantial lumbar spondylosis and degenerative disc disease. In addition, a right eccentric intervertebral spur at T8-9 causes prominent central narrowing of the thecal sac. 3. Other imaging findings of potential clinical significance: Aortic Atherosclerosis (ICD10-I70.0). Systemic atherosclerosis. Small type 1 hiatal hernia.     08/30/2022 Imaging   1. Status post hysterectomy without evidence of local recurrence. 2. No evidence of metastatic disease within the abdomen or pelvis. 3. Moderate volume of formed stool in the colon. Correlate for constipation. 4. Moderate-sized hiatal hernia. 5.  Aortic Atherosclerosis (ICD10-I70.0).     PHYSICAL EXAMINATION: ECOG PERFORMANCE STATUS: 0 - Asymptomatic  Vitals:   03/05/23 1233  BP: 109/61  Pulse: 72  Resp: 18  Temp: 98.1 F (36.7 C)  SpO2: 100%   Filed Weights   03/05/23 1233  Weight: 240 lb 9.6 oz (109.1 kg)    GENERAL:alert, no distress and comfortable NEURO: alert & oriented x 3 with fluent speech, no focal motor/sensory deficits  LABORATORY DATA:  I have reviewed the data as listed    Component Value  Date/Time   NA 139 03/05/2023 1156   NA 141 08/31/2020 1149   K 3.7 03/05/2023 1156   CL 104 03/05/2023 1156   CO2 29 03/05/2023 1156   GLUCOSE 96 03/05/2023 1156   BUN 19 03/05/2023 1156   BUN 15 08/31/2020 1149   CREATININE 0.77 03/05/2023 1156   CREATININE 0.60 11/03/2012 1228   CALCIUM 9.7 03/05/2023 1156   PROT 6.6 03/05/2023 1156   PROT 7.3 08/31/2020 1149   ALBUMIN 4.0 03/05/2023 1156   ALBUMIN 4.7 08/31/2020 1149   AST 13 (L) 03/05/2023 1156   ALT 15 03/05/2023 1156   ALKPHOS 86 03/05/2023 1156   BILITOT 0.6 03/05/2023 1156   GFRNONAA >60 03/05/2023 1156   GFRAA 91 08/31/2020 1149    No results found for: "SPEP", "UPEP"  Lab Results  Component Value Date   WBC 6.3 03/05/2023   NEUTROABS 3.5 03/05/2023   HGB 13.6 03/05/2023   HCT 39.3 03/05/2023   MCV 93.1 03/05/2023   PLT 225 03/05/2023      Chemistry      Component Value Date/Time   NA 139 03/05/2023 1156   NA 141 08/31/2020 1149   K 3.7 03/05/2023 1156   CL 104 03/05/2023 1156   CO2 29 03/05/2023 1156   BUN 19 03/05/2023 1156   BUN 15 08/31/2020 1149   CREATININE 0.77 03/05/2023 1156   CREATININE 0.60 11/03/2012 1228  GLU 81 07/24/2012 0000      Component Value Date/Time   CALCIUM 9.7 03/05/2023 1156   ALKPHOS 86 03/05/2023 1156   AST 13 (L) 03/05/2023 1156   ALT 15 03/05/2023 1156   BILITOT 0.6 03/05/2023 1156

## 2023-03-06 ENCOUNTER — Other Ambulatory Visit: Payer: Self-pay | Admitting: Family Medicine

## 2023-03-06 DIAGNOSIS — E039 Hypothyroidism, unspecified: Secondary | ICD-10-CM

## 2023-03-06 LAB — LIPID PANEL
Chol/HDL Ratio: 3.5 ratio (ref 0.0–4.4)
Cholesterol, Total: 187 mg/dL (ref 100–199)
HDL: 54 mg/dL (ref >39)
LDL Chol Calc (NIH): 109 mg/dL — ABNORMAL HIGH (ref 0–99)
Triglycerides: 139 mg/dL (ref 0–149)
VLDL Cholesterol Cal: 24 mg/dL (ref 5–40)

## 2023-03-06 LAB — HEMOGLOBIN A1C
Est. average glucose Bld gHb Est-mCnc: 123 mg/dL
Hgb A1c MFr Bld: 5.9 % — ABNORMAL HIGH (ref 4.8–5.6)

## 2023-03-06 LAB — T4, FREE: Free T4: 0.96 ng/dL (ref 0.82–1.77)

## 2023-03-06 LAB — TSH: TSH: 7.81 u[IU]/mL — ABNORMAL HIGH (ref 0.450–4.500)

## 2023-03-06 LAB — VITAMIN D 25 HYDROXY (VIT D DEFICIENCY, FRACTURES): Vit D, 25-Hydroxy: 35.1 ng/mL (ref 30.0–100.0)

## 2023-03-06 MED ORDER — LEVOTHYROXINE SODIUM 75 MCG PO TABS: 75.0000 ug | ORAL_TABLET | Freq: Every day | ORAL | 3 refills | Status: DC

## 2023-03-18 ENCOUNTER — Ambulatory Visit (INDEPENDENT_AMBULATORY_CARE_PROVIDER_SITE_OTHER): Payer: HMO | Admitting: Nurse Practitioner

## 2023-03-18 ENCOUNTER — Encounter: Payer: Self-pay | Admitting: Nurse Practitioner

## 2023-03-18 VITALS — BP 124/72 | HR 60 | Temp 97.5°F | Ht 62.0 in | Wt 238.0 lb

## 2023-03-18 DIAGNOSIS — R051 Acute cough: Secondary | ICD-10-CM | POA: Insufficient documentation

## 2023-03-18 DIAGNOSIS — J029 Acute pharyngitis, unspecified: Secondary | ICD-10-CM | POA: Insufficient documentation

## 2023-03-18 LAB — VERITOR FLU A/B WAIVED
Influenza A: NEGATIVE
Influenza B: NEGATIVE

## 2023-03-18 LAB — CULTURE, GROUP A STREP

## 2023-03-18 LAB — RAPID STREP SCREEN (MED CTR MEBANE ONLY): Strep Gp A Ag, IA W/Reflex: NEGATIVE

## 2023-03-18 NOTE — Progress Notes (Signed)
Acute Office Visit  Subjective:     Patient ID: Stacie Russell, female    DOB: Jan 01, 1951, 72 y.o.   MRN: [id number]  Chief Complaint  Patient presents with   Cough    Started Friday. Husband is also sick   Sore Throat    Started friday   Fatigue    Started friday    HPI Upper Respiratory Infection: Patient complains of symptoms of a URI. Symptoms include cough and sore throat. Onset of symptoms was 3 days ago, unchanged since that time. She also c/o cough described as productive of clear sputum, fever-duration 1  day, low grade fever, productive cough with  clear colored sputum, and shortness of breath for the past 1 day .  She is not drinking much. Evaluation to date: none. Treatment to date: cough suppressants.   POC Flu and Strep negative POC COVID administered awaiting fr results  Review of Systems  Constitutional:  Negative for chills and fever.       On day one, was relieved with OTC  Respiratory:  Positive for cough and shortness of breath. Negative for wheezing.        Clear sputum SOB very minimal  Gastrointestinal:  Negative for blood in stool, diarrhea, melena, nausea and vomiting.  Musculoskeletal:  Negative for joint pain and myalgias.  Skin:  Negative for itching and rash.  Neurological:  Negative for dizziness, weakness and headaches.   Negative unless indicated in HPI    Objective:    BP 124/72   Pulse 60   Temp (!) 97.5 F (36.4 C) (Temporal)   Ht 5\' 2"  (1.575 m)   Wt 238 lb (108 kg)   SpO2 95%   BMI 43.53 kg/m  BP Readings from Last 3 Encounters:  03/18/23 124/72  03/05/23 109/61  03/05/23 131/73   Wt Readings from Last 3 Encounters:  03/18/23 238 lb (108 kg)  03/05/23 240 lb 9.6 oz (109.1 kg)  03/05/23 238 lb 3.2 oz (108 kg)      Physical Exam Vitals and nursing note reviewed.  Constitutional:      Appearance: She is obese.  HENT:     Head: Normocephalic and atraumatic.     Nose: Nose normal. No congestion or rhinorrhea.      Right Sinus: No maxillary sinus tenderness or frontal sinus tenderness.     Left Sinus: No maxillary sinus tenderness or frontal sinus tenderness.     Mouth/Throat:     Lips: Pink.     Mouth: Mucous membranes are moist.     Pharynx: Oropharynx is clear. Uvula midline. No pharyngeal swelling, oropharyngeal exudate, posterior oropharyngeal erythema or postnasal drip.     Tonsils: No tonsillar exudate or tonsillar abscesses.  Cardiovascular:     Rate and Rhythm: Normal rate and regular rhythm.  Pulmonary:     Effort: Pulmonary effort is normal.     Breath sounds: Normal breath sounds.  Musculoskeletal:     Right lower leg: No edema.     Left lower leg: No edema.  Skin:    General: Skin is warm and dry.     Findings: No rash.  Neurological:     General: No focal deficit present.     Mental Status: She is oriented to person, place, and time. Mental status is at baseline.     Results for orders placed or performed in visit on 03/18/23  Rapid Strep Screen ([facility] ONLY)   Specimen: Other   Other  Result Value Ref Range   Strep Gp A Ag, IA W/Reflex Negative Negative  Culture, Group A Strep   Other  Result Value Ref Range   Strep A Culture CANCELED   Veritor Flu A/B Waived  Result Value Ref Range   Influenza A Negative Negative   Influenza B Negative Negative        Assessment & Plan:  Acute cough -     Veritor Flu A/B Waived -     Novel Coronavirus, NAA ([facility])  Acute sore throat -     Rapid Strep Screen ([facility] ONLY)  Other orders -     Culture, Group A Strep  Pam was seen today for URI symptoms - Negative FLU/STRep Awakening COVID results - increase hydration - take OTC cough syrup of choice - no ATB will be prescribed - client left before the Strep was resulted. I called her discuss the results and the plan of care. All questions answered.  Return if symptoms worsen or fail to improve.  Arrie Aran Santa Lighter, DNP [facility]  Medicine [address] [place], [place] [address] [phone]

## 2023-03-21 ENCOUNTER — Telehealth (INDEPENDENT_AMBULATORY_CARE_PROVIDER_SITE_OTHER): Payer: HMO | Admitting: Family Medicine

## 2023-03-21 ENCOUNTER — Encounter: Payer: Self-pay | Admitting: Family Medicine

## 2023-03-21 ENCOUNTER — Telehealth: Payer: Self-pay | Admitting: Family Medicine

## 2023-03-21 DIAGNOSIS — J208 Acute bronchitis due to other specified organisms: Secondary | ICD-10-CM

## 2023-03-21 DIAGNOSIS — B9689 Other specified bacterial agents as the cause of diseases classified elsewhere: Secondary | ICD-10-CM

## 2023-03-21 MED ORDER — AMOXICILLIN-POT CLAVULANATE 875-125 MG PO TABS
1.0000 | ORAL_TABLET | Freq: Two times a day (BID) | ORAL | 0 refills | Status: DC
Start: 2023-03-21 — End: 2023-05-07

## 2023-03-21 MED ORDER — FLUCONAZOLE 150 MG PO TABS: 150.0000 mg | ORAL_TABLET | Freq: Once | ORAL | 0 refills | Status: AC

## 2023-03-21 MED ORDER — BENZONATATE 100 MG PO CAPS: 100.0000 mg | ORAL_CAPSULE | Freq: Three times a day (TID) | ORAL | 0 refills | Status: DC | PRN

## 2023-03-21 NOTE — Telephone Encounter (Signed)
Scheduled virtual visit with Dr Reece Agar.
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
## 2023-05-06 ENCOUNTER — Inpatient Hospital Stay: Payer: HMO

## 2023-05-10 ENCOUNTER — Inpatient Hospital Stay: Payer: HMO | Attending: Gynecologic Oncology | Admitting: Gynecologic Oncology

## 2023-05-10 ENCOUNTER — Inpatient Hospital Stay (HOSPITAL_BASED_OUTPATIENT_CLINIC_OR_DEPARTMENT_OTHER): Payer: HMO

## 2023-05-10 ENCOUNTER — Encounter: Payer: Self-pay | Admitting: Gynecologic Oncology

## 2023-05-10 VITALS — BP 154/92 | HR 72 | Temp 98.6°F | Resp 18

## 2023-05-10 DIAGNOSIS — Z803 Family history of malignant neoplasm of breast: Secondary | ICD-10-CM | POA: Insufficient documentation

## 2023-05-10 DIAGNOSIS — C541 Malignant neoplasm of endometrium: Secondary | ICD-10-CM

## 2023-05-10 DIAGNOSIS — Z9221 Personal history of antineoplastic chemotherapy: Secondary | ICD-10-CM | POA: Insufficient documentation

## 2023-05-10 DIAGNOSIS — Z8542 Personal history of malignant neoplasm of other parts of uterus: Secondary | ICD-10-CM | POA: Insufficient documentation

## 2023-05-10 DIAGNOSIS — Z800 Family history of malignant neoplasm of digestive organs: Secondary | ICD-10-CM | POA: Diagnosis not present

## 2023-05-10 DIAGNOSIS — N3946 Mixed incontinence: Secondary | ICD-10-CM

## 2023-05-10 DIAGNOSIS — Z79899 Other long term (current) drug therapy: Secondary | ICD-10-CM | POA: Diagnosis not present

## 2023-05-10 DIAGNOSIS — Z808 Family history of malignant neoplasm of other organs or systems: Secondary | ICD-10-CM | POA: Insufficient documentation

## 2023-05-10 DIAGNOSIS — R350 Frequency of micturition: Secondary | ICD-10-CM | POA: Insufficient documentation

## 2023-05-10 MED ORDER — HEPARIN SOD (PORK) LOCK FLUSH 100 UNIT/ML IV SOLN
500.0000 [IU] | Freq: Once | INTRAVENOUS | Status: AC
  Administered 2023-05-10: 500 [IU]

## 2023-05-10 NOTE — Patient Instructions (Signed)
It was good to see you today.  I do not see or feel any evidence of cancer recurrence on your exam.  I will see you for follow-up in 4-5 months.  As always, if you develop any new and concerning symptoms before your next visit, please call to see me sooner.

## 2023-05-10 NOTE — Progress Notes (Signed)
Gynecologic Oncology Return Clinic Visit  05/10/23  Reason for Visit: surveillance in the setting of high risk uterine cancer   Treatment History: Oncology History Overview Note  MSI stable, endometrioid grade 2 Neg genetics   Endometrial cancer (HCC)  12/19/2020 Initial Diagnosis   Endometrial cancer (HCC)   12/26/2020 Imaging   1. Lower uterine segment/cervical mass without evidence of metastatic disease. 2. Hepatic steatosis. 3. Aortic atherosclerosis (ICD10-I70.0). Coronary artery calcification.   12/29/2020 Pathology Results   FINAL MICROSCOPIC DIAGNOSIS:   A. SENTINEL LYMPH NODE, RIGHT OBTURATOR, EXCISION:  -  No carcinoma identified in one lymph node (0/1)  -  See comment   B. SENTINEL LYMPH NODE, LEFT OBTURATOR, EXCISION:  -  Micrometastasis involving one lymph node (47mi/1)  -  See comment   C. UTERUS, CERVIX, BILATERAL TUBES AND OVARIES:  Uterus:  -  Endometrioid carcinoma, FIGO grade 2  -  See oncology table and comment below   Cervix:  -  No carcinoma identified   Bilateral Ovaries:  -  No carcinoma identified   Bilateral Fallopian tubes:  -  No carcinoma identified   ONCOLOGY TABLE:   UTERUS, CARCINOMA OR CARCINOSARCOMA: Resection   Procedure: Total hysterectomy and bilateral salpingo-oophorectomy  Histologic Type: Endometrioid carcinoma  Histologic Grade: FIGO grade 2  Myometrial Invasion:       Percentage of Myometrial Invasion: Estimated to be less than 50%  Uterine Serosa Involvement: Not identified  Cervical stromal Involvement: Not identified  Extent of involvement of other tissue/organs: Not identified  Peritoneal/Ascitic Fluid: Not submitted/unknown  Lymphovascular Invasion: Not identified  Regional Lymph Nodes:       Pelvic Lymph Nodes Examined:  2 Sentinel                                0 non-sentinel                                   2 total       Pelvic Lymph Nodes with Metastasis: 0                           Macrometastasis: (>2.0  mm): 0                          Micrometastasis: (>0.2 mm and < 2.0 mm): 1                            Isolated Tumor Cells (<0.2 mm): 0                       Laterality of Lymph Node with Tumor: 0                             Extracapsular Extension: N/A  Distant Metastasis:       Distant Site(s) Involved: N/A  Pathologic Stage Classification (pTNM, AJCC 8th Edition): pT1a, pN71mi  Ancillary Studies: MMR / MSI testing will be ordered  Representative Tumor Block: C1  Comment(s): Cytokeratin AE1/3 was performed on the lymph nodes (parts a and B) and supports the presence of micrometastasis (part B only). Drs. Luisa Hart and [facility] reviewed select parts of the case and agree  with the above diagnosis.   (v4.2.0.1)     12/29/2020 Surgery   Surgeon: Quinn Axe Pre-operative Diagnosis: endometrial cancer grade 3    Operation: Robotic-assisted laparoscopic total hysterectomy with bilateral salpingoophorectomy, SLN biopsy   Surgeon: Quinn Axe  Specimens: uterus, cervix, bilateral tubes and ovaries, right and left obturator SLN           01/22/2021 Cancer Staging   Staging form: Corpus Uteri - Carcinoma and Carcinosarcoma, AJCC 8th Edition - Pathologic stage from 01/22/2021: Stage IIIC1 (pT1a, pN54mi, cM0) - Signed by Artis Delay, MD on 01/22/2021 Stage prefix: Initial diagnosis   01/25/2021 Procedure   Successful placement of a right IJ approach Power Port with ultrasound and fluoroscopic guidance. The catheter is ready for use.   02/07/2021 - 05/19/2021 Chemotherapy   Patient is on Treatment Plan : UTERINE Carboplatin AUC 6 / Paclitaxel q21d     02/11/2021 Genetic Testing   Negative hereditary cancer genetic testing: no pathogenic variants detected in [facility] Common Hereditary Cancers +RNA Panel.  The report date is February 11, 2021.    The Common Hereditary Cancers + RNA Panel offered by [facility] includes sequencing, deletion/duplication, and RNA testing of the following 47 genes:  APC, ATM, AXIN2, BARD1, BMPR1A, BRCA1, BRCA2, BRIP1, CDH1, CDK4*, CDKN2A (p14ARF)*, CDKN2A (p16INK4a)*, CHEK2, CTNNA1, DICER1, EPCAM (Deletion/duplication testing only), GREM1 (promoter region deletion/duplication testing only), KIT, MEN1, MLH1, MSH2, MSH3, MSH6, MUTYH, NBN, NF1, NHTL1, PALB2, PDGFRA*, PMS2, POLD1, POLE, PTEN, RAD50, RAD51C, RAD51D, SDHB, SDHC, SDHD, SMAD4, SMARCA4. STK11, TP53, TSC1, TSC2, and VHL.  The following genes were evaluated for sequence changes only: SDHA and HOXB13 c.251G>A variant only.  RNA analysis is not performed for the * genes.     06/14/2021 Imaging   1. Interval hysterectomy and bilateral salpingo oophorectomy. No findings of residual/recurrent malignancy. 2. Substantial lumbar spondylosis and degenerative disc disease. In addition, a right eccentric intervertebral spur at T8-9 causes prominent central narrowing of the thecal sac. 3. Other imaging findings of potential clinical significance: Aortic Atherosclerosis (ICD10-I70.0). Systemic atherosclerosis. Small type 1 hiatal hernia.     08/30/2022 Imaging   1. Status post hysterectomy without evidence of local recurrence. 2. No evidence of metastatic disease within the abdomen or pelvis. 3. Moderate volume of formed stool in the colon. Correlate for constipation. 4. Moderate-sized hiatal hernia. 5.  Aortic Atherosclerosis (ICD10-I70.0).     Interval History: Patient reports doing well.  She has had worsening of urinary symptoms including frequency and incontinence.  Describes these as both urge incontinence as well as stress incontinence.  Continues to take oxybutynin without significant change in her symptoms.  She denies any vaginal bleeding or discharge.  Reports baseline bowel function.  Denies any abdominal or pelvic pain.  Past Medical/Surgical History: Past Medical History:  Diagnosis Date   Arthritis    Asthma    Diverticulosis    Dyspnea    with exertion    Endometrial hyperplasia    Esophageal  stricture    Family history of breast cancer 01/26/2021   Foot pain    Fundic gland polyps of stomach, benign 07/10/2015   Gastritis    GERD (gastroesophageal reflux disease)    Hemorrhoid    Hiatal hernia    Hip pain    History of kidney stones    cystoscopy   History of surgery on arm    left humerus   Hyperlipidemia    Hypertension    Hypothyroidism    Knee pain  Metabolic syndrome    Obstructive sleep apnea 08/27/2017   pt denies    Oxygen deficiency    pt denies   PONV (postoperative nausea and vomiting)    Spinal headache    few headaches recently    Tendonitis of ankle    Vitamin D deficiency     Past Surgical History:  Procedure Laterality Date   ANAL FISSURE REPAIR     CATARACT EXTRACTION  May 2009   COLONOSCOPY  12/26/07   ESOPHAGEAL DILATION     ESOPHAGOGASTRODUODENOSCOPY  12/26/07, 05/13/08   EYE SURGERY Bilateral    bilateral cataract with lens implants   IR IMAGING GUIDED PORT INSERTION  01/25/2021   KNEE CARTILAGE SURGERY Right Sept 1999   ORIF HUMERUS FRACTURE Left 01/12/2020   Procedure: OPEN REDUCTION INTERNAL FIXATION (ORIF) LEFT HUMERUS FRACTURE;  Surgeon: Dominica Severin, MD;  Location: [facility] OR;  Service: Orthopedics;  Laterality: Left;   ROBOTIC ASSISTED TOTAL HYSTERECTOMY WITH BILATERAL SALPINGO OOPHERECTOMY Bilateral 12/29/2020   Procedure: XI ROBOTIC ASSISTED TOTAL HYSTERECTOMY WITH BILATERAL SALPINGO OOPHORECTOMY;  Surgeon: Adolphus Birchwood, MD;  Location: [facility];  Service: Gynecology;  Laterality: Bilateral;   SENTINEL NODE BIOPSY N/A 12/29/2020   Procedure: SENTINEL NODE BIOPSY;  Surgeon: Adolphus Birchwood, MD;  Location: [facility];  Service: Gynecology;  Laterality: N/A;   TOTAL KNEE ARTHROPLASTY Right 10/07/2017   Procedure: RIGHT TOTAL KNEE ARTHROPLASTY;  Surgeon: Ollen Gross, MD;  Location: [facility];  Service: Orthopedics;  Laterality: Right;  Adductor Block   UMBILICAL HERNIA REPAIR  05/20/06    Family History  Problem Relation Age of Onset   Colon  cancer Mother 61   Heart attack Father    Heart disease Sister    Breast cancer Sister 71   Breast cancer Sister        dx 58s   Cancer Maternal Aunt 29       "female cancer", unknown type   Cancer Maternal Grandmother        unknown type; dx 35s   Cancer Maternal Grandfather        unknown type; dx 34s   Cancer Cousin        maternal female cousin; dx 22s; unknown type   Cancer Cousin        maternal female cousin; dx 24s; unknown type   Cancer Cousin        maternal female cousin; dx after 91; unknown type   Esophageal cancer Neg Hx    Stomach cancer Neg Hx    Rectal cancer Neg Hx     Social History   Socioeconomic History   Marital status: Married    Spouse name: Maisie Fus   Number of children: 2   Years of education: 14   Highest education level: Associate degree: academic program  Occupational History   Occupation: Retired  Tobacco Use   Smoking status: Never   Smokeless tobacco: Never  Vaping Use   Vaping status: Never Used  Substance and Sexual Activity   Alcohol use: No   Drug use: No   Sexual activity: Not Currently    Birth control/protection: Post-menopausal  Other Topics Concern   Not on file  Social History Narrative   Married and retired   No EtOH, tobacco or drug use   Social Determinants of Health   Financial Resource Strain: Low Risk  (05/12/2021)   Overall Financial Resource Strain (CARDIA)    Difficulty of Paying Living Expenses: Not hard at all  Food Insecurity: No Food  Insecurity (05/12/2021)   Hunger Vital Sign    Worried About Running Out of Food in the Last Year: Never true    Ran Out of Food in the Last Year: Never true  Transportation Needs: No Transportation Needs (05/12/2021)   PRAPARE - Administrator, Civil Service (Medical): No    Lack of Transportation (Non-Medical): No  Physical Activity: Inactive (05/12/2021)   Exercise Vital Sign    Days of Exercise per Week: 0 days    Minutes of Exercise per Session: 0 min   Stress: Stress Concern Present (05/12/2021)   [employer] of Occupational Health - Occupational Stress Questionnaire    Feeling of Stress : To some extent  Social Connections: Socially Integrated (05/12/2021)   Social Connection and Isolation Panel [NHANES]    Frequency of Communication with Friends and Family: More than three times a week    Frequency of Social Gatherings with Friends and Family: More than three times a week    Attends Religious Services: More than 4 times per year    Active Member of [employer] or Organizations: Yes    Attends Banker Meetings: 1 to 4 times per year    Marital Status: Married    Current Medications:  Current Outpatient Medications:    acetaminophen (TYLENOL) 500 MG tablet, Take 1,000 mg by mouth as needed (pain.)., Disp: , Rfl:    albuterol (VENTOLIN HFA) 108 (90 Base) MCG/ACT inhaler, Inhale 2 puffs into the lungs every 6 (six) hours as needed for wheezing or shortness of breath. (Patient taking differently: Inhale 2 puffs into the lungs as needed for wheezing or shortness of breath. PRN), Disp: 6.7 g, Rfl: 1   benzonatate (TESSALON PERLES) 100 MG capsule, Take 1 capsule (100 mg total) by mouth 3 (three) times daily as needed for cough., Disp: 20 capsule, Rfl: 0   budesonide-formoterol (SYMBICORT) 80-4.5 MCG/ACT inhaler, Inhale 2 puffs into the lungs 2 (two) times daily. (Patient taking differently: Inhale 2 puffs into the lungs 2 (two) times daily as needed.), Disp: 3 each, Rfl: 11   busPIRone (BUSPAR) 7.5 MG tablet, Take 1 tablet (7.5 mg total) by mouth 2 (two) times daily., Disp: 180 tablet, Rfl: 3   Calcium Carbonate-Vit D-Min (CALCIUM 1200 PO), Take 1 tablet by mouth in the morning., Disp: , Rfl:    Cholecalciferol (VITAMIN D3) 2000 units TABS, Take 2,000 Units by mouth in the morning., Disp: , Rfl:    DULoxetine (CYMBALTA) 30 MG capsule, Take 2 capsules (60 mg total) by mouth daily., Disp: 90 capsule, Rfl: 3   levothyroxine (SYNTHROID)  75 MCG tablet, Take 1 tablet (75 mcg total) by mouth daily before breakfast., Disp: 90 tablet, Rfl: 3   lidocaine-prilocaine (EMLA) cream, Apply to affected area once, Disp: 30 g, Rfl: 3   losartan-hydrochlorothiazide (HYZAAR) 100-12.5 MG tablet, Take 1 tablet by mouth daily., Disp: 100 tablet, Rfl: 3   Omega-3 Fatty Acids (FISH OIL) 1000 MG CAPS, Take 1,000 mg by mouth in the morning., Disp: , Rfl:    oxybutynin (DITROPAN) 5 MG tablet, TAKE 2 TABLETS BY MOUTH TWICE  DAILY, Disp: 400 tablet, Rfl: 1   pantoprazole (PROTONIX) 40 MG tablet, Take 1 tablet (40 mg total) by mouth 2 (two) times daily before a meal., Disp: 200 tablet, Rfl: 3   vitamin B-12 (CYANOCOBALAMIN) 1000 MCG tablet, Take 1,000 mcg by mouth in the morning., Disp: , Rfl:    clobetasol ointment (TEMOVATE) 0.05 %, Apply 1 Application topically 3 (three)  times a week. Apply cream to the area where you have had itching 3 times a week at night. (Patient not taking: Reported on 05/07/2023), Disp: 30 g, Rfl: 0   furosemide (LASIX) 20 MG tablet, TAKE 1 TABLET BY MOUTH DAILY AS  NEEDED FOR FLUID (Patient not taking: Reported on 05/07/2023), Disp: 100 tablet, Rfl: 0   mirabegron ER (MYRBETRIQ) 50 MG TB24 tablet, Take 1 tablet (50 mg total) by mouth daily. (Patient not taking: Reported on 05/07/2023), Disp: 30 tablet, Rfl: 0   valACYclovir (VALTREX) 1000 MG tablet, TAKE 1 TABLET BY MOUTH TWICE  DAILY FOR 7 DAYS; IF HAVING  ANOTHER FLARE, TAKE 2 TABLETS  TWICE DAILY FOR 1 DAY ONLY PER  FLAREUP (Patient not taking: Reported on 05/07/2023), Disp: 100 tablet, Rfl: 0  Review of Systems: + Frequency, incontinence Denies appetite changes, fevers, chills, fatigue, unexplained weight changes. Denies hearing loss, neck lumps or masses, mouth sores, ringing in ears or voice changes. Denies cough or wheezing.  Denies shortness of breath. Denies chest pain or palpitations. Denies leg swelling. Denies abdominal distention, pain, blood in stools, constipation,  diarrhea, nausea, vomiting, or early satiety. Denies pain with intercourse, dysuria, hematuria. Denies hot flashes, pelvic pain, vaginal bleeding or vaginal discharge.   Denies joint pain, back pain or muscle pain/cramps. Denies itching, rash, or wounds. Denies dizziness, headaches, numbness or seizures. Denies swollen lymph nodes or glands, denies easy bruising or bleeding. Denies anxiety, depression, confusion, or decreased concentration.  Physical Exam: BP (!) 154/92   Pulse 72   Temp 98.6 F (37 C)   Resp 18   SpO2 98%  General: Alert, oriented, no acute distress. HEENT: Normocephalic, atraumatic, sclera anicteric. Some white patches on her tongue. Chest: Clear to auscultation bilaterally.  No wheezes or rhonchi. Cardiovascular: Regular rate and rhythm, no murmurs. Abdomen: Obese, soft, nontender.  Normoactive bowel sounds.  No masses or hepatosplenomegaly appreciated.  Well-healed incisions. Extremities: Grossly normal range of motion.  Warm, well perfused.  No edema bilaterally. Skin: No rashes or lesions noted. Lymphatics: No cervical, supraclavicular, or inguinal adenopathy. GU: Normal appearing external genitalia without erythema, excoriation, or lesions.  No loss of architecture, no visible lesions along the right labia on exam today.  Speculum exam reveals moderately atrophic vaginal mucosa, no lesions or masses, no bleeding or discharge noted.  Laxity of the vaginal wall noted.  Bimanual exam reveals cuff and vaginal mucosa smooth, no nodularity.  Rectovaginal exam confirms these findings.    Laboratory & Radiologic Studies: None new  Assessment & Plan: Stacie Russell is a 72 y.o. 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  Completed adjuvant chemotherapy in 05/2021.  Posttreatment imaging shows no evidence of disease. Last imaging 08/2022.   Patient is overall doing well and is NED on exam today.   She continues to have  urinary frequency and incontinence, no noticeable improvement with oxybutynin.  Given worsening symptoms, discussed having her see Dr. Annabell Howells (who she has seen before).  Also discussed that we now have urogynecologist here at the hospital.  She was interested in referral to urogynecology.  This was placed today.   I will plan to see her back in 3 months for surveillance visit.  Discussed signs and symptoms that would be concerning for cancer recurrence and stressed the importance of calling if she develops any of these.  Asked office to schedule CT scan due in January ordered by Dr. Bertis Ruddy.  20 minutes of total time was  spent for this patient encounter, including preparation, face-to-face counseling with the patient and coordination of care, and documentation of the encounter.  Eugene Garnet, MD  Division of Gynecologic Oncology  Department of Obstetrics and Gynecology  [facility]

## 2023-05-13 ENCOUNTER — Telehealth: Payer: Self-pay | Admitting: Family Medicine

## 2023-05-13 DIAGNOSIS — I10 Essential (primary) hypertension: Secondary | ICD-10-CM

## 2023-05-13 DIAGNOSIS — Z6379 Other stressful life events affecting family and household: Secondary | ICD-10-CM

## 2023-05-13 DIAGNOSIS — N3281 Overactive bladder: Secondary | ICD-10-CM

## 2023-05-13 MED ORDER — OXYBUTYNIN CHLORIDE 5 MG PO TABS: 10.0000 mg | ORAL_TABLET | Freq: Two times a day (BID) | ORAL | 0 refills | Status: DC

## 2023-05-13 MED ORDER — DULOXETINE HCL 30 MG PO CPEP: 60.0000 mg | ORAL_CAPSULE | Freq: Every day | ORAL | 0 refills | Status: DC

## 2023-05-13 MED ORDER — LOSARTAN POTASSIUM-HCTZ 100-12.5 MG PO TABS: 1.0000 | ORAL_TABLET | Freq: Every day | ORAL | 0 refills | Status: AC

## 2023-05-13 NOTE — Telephone Encounter (Signed)
Aware refill sent to pharmacy ?

## 2023-05-13 NOTE — Telephone Encounter (Signed)
  Prescription Request  05/13/2023  Is this a "Controlled Substance" medicine? no  Have you seen your PCP in the last 2 weeks? no  If YES, route message to pool  -  If NO, patient needs to be scheduled for appointment.  What is the name of the medication or equipment? Duloxetine 30 mg, Losartan 100-12.5 mg,Oxybutynin 5 mg  Have you contacted your pharmacy to request a refill? yes   Which pharmacy would you like this sent to? [facility]   Patient notified that their request is being sent to the clinical staff for review and that they should receive a response within 2 business days.

## 2023-05-23 ENCOUNTER — Ambulatory Visit: Payer: HMO

## 2023-05-23 VITALS — Ht 62.0 in | Wt 238.0 lb

## 2023-05-23 DIAGNOSIS — Z0000 Encounter for general adult medical examination without abnormal findings: Secondary | ICD-10-CM | POA: Diagnosis not present

## 2023-05-23 NOTE — Patient Instructions (Signed)
Ms. Leidy , Thank you for taking time to come for your Medicare Wellness Visit. I appreciate your ongoing commitment to your health goals. Please review the following plan we discussed and let me know if I can assist you in the future.   Referrals/Orders/Follow-Ups/Clinician Recommendations: Aim for 30 minutes of exercise or brisk walking, 6-8 glasses of water, and 5 servings of fruits and vegetables each day.   This is a list of the screening recommended for you and due dates:  Health Maintenance  Topic Date Due   Zoster (Shingles) Vaccine (1 of 2) 08/08/1970   COVID-19 Vaccine (3 - Moderna risk series) 09/28/2020   Flu Shot  03/14/2023   Mammogram  07/17/2023   Medicare Annual Wellness Visit  05/22/2024   Colon Cancer Screening  06/27/2026   DTaP/Tdap/Td vaccine (3 - Td or Tdap) 07/18/2032   Pneumonia Vaccine  Completed   DEXA scan (bone density measurement)  Completed   Hepatitis C Screening  Completed   HPV Vaccine  Aged Out    Advanced directives: (In Chart) A copy of your advanced directives are scanned into your chart should your provider ever need it.  Next Medicare Annual Wellness Visit scheduled for next year: Yes  Insert Preventive Care attachment Insert FALL PREVENTION attachment if needed

## 2023-05-23 NOTE — Progress Notes (Signed)
Subjective:   Stacie Russell is a 72 y.o. female who presents for Medicare Annual (Subsequent) preventive examination.  Visit Complete: Virtual I connected with  Stacie Russell on 05/23/23 by a audio enabled telemedicine application and verified that I am speaking with the correct person using two identifiers.  Patient Location: Home  Provider Location: Home Office  I discussed the limitations of evaluation and management by telemedicine. The patient expressed understanding and agreed to proceed.  Vital Signs: Because this visit was a virtual/telehealth visit, some criteria may be missing or patient reported. Any vitals not documented were not able to be obtained and vitals that have been documented are patient reported.  Patient Medicare AWV questionnaire was completed by the patient on 05/23/2023; I have confirmed that all information answered by patient is correct and no changes since this date.  Cardiac Risk Factors include: advanced age (>98men, >85 women);dyslipidemia;hypertension     Objective:    Today's Vitals   05/23/23 0803  Weight: 238 lb (108 kg)  Height: 5\' 2"  (1.575 m)   Body mass index is 43.53 kg/m.     05/23/2023    8:06 AM 02/05/2023    3:02 PM 05/14/2022    1:20 PM 08/03/2021    2:39 PM 05/12/2021   12:54 PM 05/03/2021    4:38 PM 03/21/2021   11:59 AM  Advanced Directives  Does Patient Have a Medical Advance Directive? Yes Yes Yes Yes Yes Yes Yes  Type of Estate agent of [place];Living will Healthcare Power of [place];Living will Healthcare Power of [place];Living will Healthcare Power of [place];Living will Healthcare Power of [place];Living will Healthcare Power of [place];Living will Healthcare Power of [place];Living will  Does patient want to make changes to medical advance directive? No - Patient declined  No - Patient declined No - Patient declined  No - Patient declined   Copy of Healthcare Power of Attorney in  Chart? Yes - validated most recent copy scanned in chart (See row information)  No - copy requested  Yes - validated most recent copy scanned in chart (See row information) No - copy requested No - copy requested    Current Medications (verified) Outpatient Encounter Medications as of 05/23/2023  Medication Sig   acetaminophen (TYLENOL) 500 MG tablet Take 1,000 mg by mouth as needed (pain.).   albuterol (VENTOLIN HFA) 108 (90 Base) MCG/ACT inhaler Inhale 2 puffs into the lungs every 6 (six) hours as needed for wheezing or shortness of breath. (Patient taking differently: Inhale 2 puffs into the lungs as needed for wheezing or shortness of breath. PRN)   benzonatate (TESSALON PERLES) 100 MG capsule Take 1 capsule (100 mg total) by mouth 3 (three) times daily as needed for cough.   budesonide-formoterol (SYMBICORT) 80-4.5 MCG/ACT inhaler Inhale 2 puffs into the lungs 2 (two) times daily. (Patient taking differently: Inhale 2 puffs into the lungs 2 (two) times daily as needed.)   busPIRone (BUSPAR) 7.5 MG tablet Take 1 tablet (7.5 mg total) by mouth 2 (two) times daily.   Calcium Carbonate-Vit D-Min (CALCIUM 1200 PO) Take 1 tablet by mouth in the morning.   Cholecalciferol (VITAMIN D3) 2000 units TABS Take 2,000 Units by mouth in the morning.   clobetasol ointment (TEMOVATE) 0.05 % Apply 1 Application topically 3 (three) times a week. Apply cream to the area where you have had itching 3 times a week at night.   DULoxetine (CYMBALTA) 30 MG capsule Take 2 capsules (60 mg total) by mouth  daily.   furosemide (LASIX) 20 MG tablet TAKE 1 TABLET BY MOUTH DAILY AS  NEEDED FOR FLUID   levothyroxine (SYNTHROID) 75 MCG tablet Take 1 tablet (75 mcg total) by mouth daily before breakfast.   lidocaine-prilocaine (EMLA) cream Apply to affected area once   losartan-hydrochlorothiazide (HYZAAR) 100-12.5 MG tablet Take 1 tablet by mouth daily.   mirabegron ER (MYRBETRIQ) 50 MG TB24 tablet Take 1 tablet (50 mg total)  by mouth daily.   Omega-3 Fatty Acids (FISH OIL) 1000 MG CAPS Take 1,000 mg by mouth in the morning.   oxybutynin (DITROPAN) 5 MG tablet Take 2 tablets (10 mg total) by mouth 2 (two) times daily.   pantoprazole (PROTONIX) 40 MG tablet Take 1 tablet (40 mg total) by mouth 2 (two) times daily before a meal.   valACYclovir (VALTREX) 1000 MG tablet TAKE 1 TABLET BY MOUTH TWICE  DAILY FOR 7 DAYS; IF HAVING  ANOTHER FLARE, TAKE 2 TABLETS  TWICE DAILY FOR 1 DAY ONLY PER  FLAREUP   vitamin B-12 (CYANOCOBALAMIN) 1000 MCG tablet Take 1,000 mcg by mouth in the morning.   No facility-administered encounter medications on file as of 05/23/2023.    Allergies (verified) Tramadol   History: Past Medical History:  Diagnosis Date   Arthritis    Asthma    Diverticulosis    Dyspnea    with exertion    Endometrial hyperplasia    Esophageal stricture    Family history of breast cancer 01/26/2021   Foot pain    Fundic gland polyps of stomach, benign 07/10/2015   Gastritis    GERD (gastroesophageal reflux disease)    Hemorrhoid    Hiatal hernia    Hip pain    History of kidney stones    cystoscopy   History of surgery on arm    left humerus   Hyperlipidemia    Hypertension    Hypothyroidism    Knee pain    Metabolic syndrome    Obstructive sleep apnea 08/27/2017   pt denies    Oxygen deficiency    pt denies   PONV (postoperative nausea and vomiting)    Spinal headache    few headaches recently    Tendonitis of ankle    Vitamin D deficiency    Past Surgical History:  Procedure Laterality Date   ANAL FISSURE REPAIR     CATARACT EXTRACTION  May 2009   COLONOSCOPY  12/26/07   ESOPHAGEAL DILATION     ESOPHAGOGASTRODUODENOSCOPY  12/26/07, 05/13/08   EYE SURGERY Bilateral    bilateral cataract with lens implants   IR IMAGING GUIDED PORT INSERTION  01/25/2021   KNEE CARTILAGE SURGERY Right Sept 1999   ORIF HUMERUS FRACTURE Left 01/12/2020   Procedure: OPEN REDUCTION INTERNAL FIXATION (ORIF)  LEFT HUMERUS FRACTURE;  Surgeon: Dominica Severin, MD;  Location: [facility];  Service: Orthopedics;  Laterality: Left;   ROBOTIC ASSISTED TOTAL HYSTERECTOMY WITH BILATERAL SALPINGO OOPHERECTOMY Bilateral 12/29/2020   Procedure: XI ROBOTIC ASSISTED TOTAL HYSTERECTOMY WITH BILATERAL SALPINGO OOPHORECTOMY;  Surgeon: Adolphus Birchwood, MD;  Location: [facility];  Service: Gynecology;  Laterality: Bilateral;   SENTINEL NODE BIOPSY N/A 12/29/2020   Procedure: SENTINEL NODE BIOPSY;  Surgeon: Adolphus Birchwood, MD;  Location: [facility];  Service: Gynecology;  Laterality: N/A;   TOTAL KNEE ARTHROPLASTY Right 10/07/2017   Procedure: RIGHT TOTAL KNEE ARTHROPLASTY;  Surgeon: Ollen Gross, MD;  Location: [facility];  Service: Orthopedics;  Laterality: Right;  Adductor Block   UMBILICAL HERNIA REPAIR  05/20/06  Family History  Problem Relation Age of Onset   Colon cancer Mother 17   Heart attack Father    Heart disease Sister    Breast cancer Sister 104   Breast cancer Sister        dx 67s   Cancer Maternal Aunt 93       "female cancer", unknown type   Cancer Maternal Grandmother        unknown type; dx 43s   Cancer Maternal Grandfather        unknown type; dx 26s   Cancer Cousin        maternal female cousin; dx 40s; unknown type   Cancer Cousin        maternal female cousin; dx 48s; unknown type   Cancer Cousin        maternal female cousin; dx after 80; unknown type   Esophageal cancer Neg Hx    Stomach cancer Neg Hx    Rectal cancer Neg Hx    Social History   Socioeconomic History   Marital status: Married    Spouse name: Maisie Fus   Number of children: 2   Years of education: 14   Highest education level: Associate degree: academic program  Occupational History   Occupation: Retired  Tobacco Use   Smoking status: Never   Smokeless tobacco: Never  Vaping Use   Vaping status: Never Used  Substance and Sexual Activity   Alcohol use: No   Drug use: No   Sexual activity: Not Currently    Birth  control/protection: Post-menopausal  Other Topics Concern   Not on file  Social History Narrative   Married and retired   No EtOH, tobacco or drug use   Social Determinants of Health   Financial Resource Strain: Low Risk  (05/23/2023)   Overall Financial Resource Strain (CARDIA)    Difficulty of Paying Living Expenses: Not hard at all  Food Insecurity: No Food Insecurity (05/23/2023)   Hunger Vital Sign    Worried About Running Out of Food in the Last Year: Never true    Ran Out of Food in the Last Year: Never true  Transportation Needs: No Transportation Needs (05/23/2023)   PRAPARE - Administrator, Civil Service (Medical): No    Lack of Transportation (Non-Medical): No  Physical Activity: Inactive (05/23/2023)   Exercise Vital Sign    Days of Exercise per Week: 0 days    Minutes of Exercise per Session: 0 min  Stress: No Stress Concern Present (05/23/2023)   [employer] of Occupational Health - Occupational Stress Questionnaire    Feeling of Stress : Not at all  Social Connections: Socially Integrated (05/23/2023)   Social Connection and Isolation Panel [NHANES]    Frequency of Communication with Friends and Family: More than three times a week    Frequency of Social Gatherings with Friends and Family: More than three times a week    Attends Religious Services: More than 4 times per year    Active Member of [employer] or Organizations: Yes    Attends Engineer, structural: More than 4 times per year    Marital Status: Married    Tobacco Counseling Counseling given: Not Answered   Clinical Intake:  Pre-visit preparation completed: Yes  Pain : No/denies pain     Nutritional Risks: None Diabetes: No  How often do you need to have someone help you when you read instructions, pamphlets, or other written materials from your doctor or pharmacy?: 1 -  Never  Interpreter Needed?: No  Information entered by :: Renie Ora, LPN   Activities of  Daily Living    05/23/2023    8:06 AM  In your present state of health, do you have any difficulty performing the following activities:  Hearing? 0  Vision? 0  Difficulty concentrating or making decisions? 0  Walking or climbing stairs? 0  Dressing or bathing? 0  Doing errands, shopping? 0  Preparing Food and eating ? N  Using the Toilet? N  In the past six months, have you accidently leaked urine? N  Do you have problems with loss of bowel control? N  Managing your Medications? N  Managing your Finances? N  Housekeeping or managing your Housekeeping? N    Patient Care Team: Raliegh Ip, DO as PCP - General (Family Medicine) Runell Gess, MD as PCP - Cardiology (Cardiology) Bjorn Pippin, MD (Urology) Huel Cote, MD (Obstetrics and Gynecology) Iva Boop, MD (Gastroenterology) Ollen Gross, MD as Consulting Physician (Orthopedic Surgery) Artis Delay, MD as Consulting Physician (Hematology and Oncology)  Indicate any recent Medical Services you may have received from other than [facility] providers in the past year (date may be approximate).     Assessment:   This is a routine wellness examination for Emmeline.  Hearing/Vision screen Vision Screening - Comments:: Wears rx glasses - up to date with routine eye exams with  Dr.Groat    Goals Addressed             This Visit's Progress    DIET - INCREASE WATER INTAKE   On track    Try to drink 6-8 glasses of water daily.       Depression Screen    05/23/2023    8:05 AM 03/18/2023   11:26 AM 01/18/2023   10:18 AM 08/07/2022    2:51 PM 07/18/2022    9:35 AM 07/19/2021   10:35 AM 05/12/2021   12:50 PM  PHQ 2/9 Scores  PHQ - 2 Score 0 0 2 0 0 1 3  PHQ- 9 Score  4 10   8 10     Fall Risk    05/23/2023    8:04 AM 03/18/2023   11:26 AM 03/05/2023    9:52 AM 08/07/2022    2:50 PM 07/18/2022    9:48 AM  Fall Risk   Falls in the past year? 0 0 0 0 0  Number falls in past yr: 0 0 0    Injury with Fall?  0 0 0    Risk for fall due to : No Fall Risks No Fall Risks No Fall Risks    Follow up Falls prevention discussed Falls evaluation completed Education provided      MEDICARE RISK AT HOME: Medicare Risk at Home Any stairs in or around the home?: Yes If so, are there any without handrails?: No Home free of loose throw rugs in walkways, pet beds, electrical cords, etc?: Yes Adequate lighting in your home to reduce risk of falls?: Yes Life alert?: No Use of a cane, walker or w/c?: No Grab bars in the bathroom?: Yes Shower chair or bench in shower?: Yes Elevated toilet seat or a handicapped toilet?: Yes  TIMED UP AND GO:  Was the test performed?  No    Cognitive Function:    04/22/2018   11:18 AM  MMSE - Mini Mental State Exam  Orientation to time 5  Orientation to Place 5  Registration 3  Attention/ Calculation 5  Recall  3  Language- name 2 objects 2  Language- repeat 1  Language- follow 3 step command 3  Language- read & follow direction 1  Write a sentence 1  Copy design 1  Total score 30        05/23/2023    8:07 AM 05/14/2022    1:21 PM 05/11/2020    2:04 PM 04/29/2019   10:46 AM  6CIT Screen  What Year? 0 points 0 points 0 points 0 points  What month? 0 points 0 points 0 points 0 points  What time? 0 points 0 points 0 points 0 points  Count back from 20 0 points 0 points 0 points 0 points  Months in reverse 0 points 0 points 0 points 0 points  Repeat phrase 0 points 0 points 0 points 0 points  Total Score 0 points 0 points 0 points 0 points    Immunizations Immunization History  Administered Date(s) Administered   Fluad Quad(high Dose 65+) 06/16/2019, 08/31/2020, 05/19/2021, 07/18/2022   Influenza Whole 07/25/2012   Influenza, High Dose Seasonal PF 06/11/2017, 06/25/2018   Influenza,inj,Quad PF,6+ Mos 05/06/2013, 06/25/2014, 06/20/2015   Moderna SARS-COV2 Booster Vaccination 08/31/2020   Moderna Sars-Covid-2 Vaccination 09/14/2019, 10/05/2019    Pneumococcal Conjugate-13 05/06/2013   Pneumococcal Polysaccharide-23 09/05/2016   Td 07/18/2022   Tdap 04/26/2011   Zoster, Live 04/26/2011    TDAP status: Up to date  Flu Vaccine status: Due, Education has been provided regarding the importance of this vaccine. Advised may receive this vaccine at local pharmacy or Health Dept. Aware to provide a copy of the vaccination record if obtained from local pharmacy or Health Dept. Verbalized acceptance and understanding.  Pneumococcal vaccine status: Up to date  Covid-19 vaccine status: Completed vaccines  Qualifies for Shingles Vaccine? Yes   Zostavax completed No   Shingrix Completed?: No.    Education has been provided regarding the importance of this vaccine. Patient has been advised to call insurance company to determine out of pocket expense if they have not yet received this vaccine. Advised may also receive vaccine at local pharmacy or Health Dept. Verbalized acceptance and understanding.  Screening Tests Health Maintenance  Topic Date Due   Zoster Vaccines- Shingrix (1 of 2) 08/08/1970   COVID-19 Vaccine (3 - Moderna risk series) 09/28/2020   INFLUENZA VACCINE  03/14/2023   MAMMOGRAM  07/17/2023   Medicare Annual Wellness (AWV)  05/22/2024   Colonoscopy  06/27/2026   DTaP/Tdap/Td (3 - Td or Tdap) 07/18/2032   Pneumonia Vaccine 43+ Years old  Completed   DEXA SCAN  Completed   Hepatitis C Screening  Completed   HPV VACCINES  Aged Out    Health Maintenance  Health Maintenance Due  Topic Date Due   Zoster Vaccines- Shingrix (1 of 2) 08/08/1970   COVID-19 Vaccine (3 - Moderna risk series) 09/28/2020   INFLUENZA VACCINE  03/14/2023    Colorectal cancer screening: Type of screening: Colonoscopy. Completed 06/27/2021. Repeat every 5 years  Mammogram status: Completed 07/16/2022. Repeat every year  Bone Density status: Completed 08/07/2022. Results reflect: Bone density results: OSTEOPOROSIS. Repeat every 2 years.  Lung  Cancer Screening: (Low Dose CT Chest recommended if Age 20-80 years, 20 pack-year currently smoking OR have quit w/in 15years.) does not qualify.   Lung Cancer Screening Referral: n/a  Additional Screening:  Hepatitis C Screening: does not qualify; Completed 03/01/2020  Vision Screening: Recommended annual ophthalmology exams for early detection of glaucoma and other disorders of the eye. Is the patient up  to date with their annual eye exam?  Yes  Who is the provider or what is the name of the office in which the patient attends annual eye exams? Dr.Groat  If pt is not established with a provider, would they like to be referred to a provider to establish care? No .   Dental Screening: Recommended annual dental exams for proper oral hygiene   Community Resource Referral / Chronic Care Management: CRR required this visit?  No   CCM required this visit?  No     Plan:     I have personally reviewed and noted the following in the patient's chart:   Medical and social history Use of alcohol, tobacco or illicit drugs  Current medications and supplements including opioid prescriptions. Patient is not currently taking opioid prescriptions. Functional ability and status Nutritional status Physical activity Advanced directives List of other physicians Hospitalizations, surgeries, and ER visits in previous 12 months Vitals Screenings to include cognitive, depression, and falls Referrals and appointments  In addition, I have reviewed and discussed with patient certain preventive protocols, quality metrics, and best practice recommendations. A written personalized care plan for preventive services as well as general preventive health recommendations were provided to patient.     Lorrene Reid, LPN   16/05/[id number]   After Visit Summary: (MyChart) Due to this being a telephonic visit, the after visit summary with patients personalized plan was offered to patient via MyChart   Nurse  Notes: none

## 2023-06-05 ENCOUNTER — Ambulatory Visit: Payer: HMO | Admitting: Family Medicine

## 2023-06-10 DIAGNOSIS — H18593 Other hereditary corneal dystrophies, bilateral: Secondary | ICD-10-CM | POA: Diagnosis not present

## 2023-06-10 DIAGNOSIS — Z961 Presence of intraocular lens: Secondary | ICD-10-CM | POA: Diagnosis not present

## 2023-06-10 DIAGNOSIS — H1045 Other chronic allergic conjunctivitis: Secondary | ICD-10-CM | POA: Diagnosis not present

## 2023-06-10 DIAGNOSIS — H524 Presbyopia: Secondary | ICD-10-CM | POA: Diagnosis not present

## 2023-06-10 DIAGNOSIS — H04123 Dry eye syndrome of bilateral lacrimal glands: Secondary | ICD-10-CM | POA: Diagnosis not present

## 2023-06-10 DIAGNOSIS — H35033 Hypertensive retinopathy, bilateral: Secondary | ICD-10-CM | POA: Diagnosis not present

## 2023-06-10 DIAGNOSIS — H35373 Puckering of macula, bilateral: Secondary | ICD-10-CM | POA: Diagnosis not present

## 2023-06-11 ENCOUNTER — Ambulatory Visit: Payer: HMO | Admitting: Obstetrics

## 2023-06-11 DIAGNOSIS — R350 Frequency of micturition: Secondary | ICD-10-CM

## 2023-06-11 NOTE — Progress Notes (Deleted)
New Patient Evaluation and Consultation  Referring Provider: Carver Fila, MD PCP: Raliegh Ip, DO Date of Service: 06/11/2023  SUBJECTIVE Chief Complaint: No chief complaint on file.  History of Present Illness: Stacie Russell is a 72 y.o. {ED SANE [phone] female seen in consultation at the request of {Dr, PA, GE:[id number]} Pricilla Holm for evaluation of ***.    ***Review of records significant for: ***  Urinary Symptoms: {urine leakage?:24754} Leaks *** time(s) per {days/wks/mos/yrs:310907}.  Pad use: {NUMBERS 1-10:18281} {pad option:24752} per day.   Patient {ACTION; IS/IS XLK:44010272} bothered by UI symptoms.  Day time voids ***.  Nocturia: *** times per night to void. Voiding dysfunction:  {empties:24755} bladder well.  Patient {DOES NOT does:27190::"does not"} use a catheter to empty bladder.  When urinating, patient feels {urine symptoms:24756} Drinks: *** per day  UTIs: {NUMBERS 1-10:18281} UTI's in the last year.   {ACTIONS;DENIES/REPORTS:21021675::"Denies"} history of {urologic concerns:24757} No results found for the last 90 days.   Pelvic Organ Prolapse Symptoms:                  Patient {denies/ admits to:24761} a feeling of a bulge the vaginal area. It has been present for {NUMBER 1-10:22536} {days/wks/mos/yrs:310907}.  Patient {denies/ admits to:24761} seeing a bulge.  This bulge {ACTION; IS/IS ZDG:64403474} bothersome.  Bowel Symptom: Bowel movements: *** time(s) per {Time; day/week/month:13537} Stool consistency: {stool consistency:24758} Straining: {yes/no:19897}.  Splinting: {yes/no:19897}.  Incomplete evacuation: {yes/no:19897}.  Patient {denies/ admits to:24761} accidental bowel leakage / fecal incontinence  Occurs: *** time(s) per {Time; day/week/month:13537}  Consistency with leakage: {stool consistency:24758} Bowel regimen: {bowel regimen:24759} Last colonoscopy: Date ***, Results *** [facility] Colonoscopy          Colonoscopy (Every 5  Years) Next due on 06/27/2026    06/27/2021  COLONOSCOPY   Only the first 1 history entries have been loaded, but more history exists.            Sexual Function Sexually active: {yes/no:19897}.  Sexual orientation: {Sexual [phone]} Pain with sex: {pain with sex:24762}  Pelvic Pain {denies/ admits to:24761} pelvic pain Location: *** Pain occurs: *** Prior pain treatment: *** Improved by: *** Worsened by: ***   Past Medical History:  Past Medical History:  Diagnosis Date   Arthritis    Asthma    Diverticulosis    Dyspnea    with exertion    Endometrial hyperplasia    Esophageal stricture    Family history of breast cancer 01/26/2021   Foot pain    Fundic gland polyps of stomach, benign 07/10/2015   Gastritis    GERD (gastroesophageal reflux disease)    Hemorrhoid    Hiatal hernia    Hip pain    History of kidney stones    cystoscopy   History of surgery on arm    left humerus   Hyperlipidemia    Hypertension    Hypothyroidism    Knee pain    Metabolic syndrome    Obstructive sleep apnea 08/27/2017   pt denies    Oxygen deficiency    pt denies   PONV (postoperative nausea and vomiting)    Spinal headache    few headaches recently    Tendonitis of ankle    Vitamin D deficiency      Past Surgical History:   Past Surgical History:  Procedure Laterality Date   ANAL FISSURE REPAIR     CATARACT EXTRACTION  May 2009   COLONOSCOPY  12/26/07   ESOPHAGEAL DILATION     ESOPHAGOGASTRODUODENOSCOPY  12/26/07, 05/13/08   EYE SURGERY Bilateral    bilateral cataract with lens implants   IR IMAGING GUIDED PORT INSERTION  01/25/2021   KNEE CARTILAGE SURGERY Right Sept 1999   ORIF HUMERUS FRACTURE Left 01/12/2020   Procedure: OPEN REDUCTION INTERNAL FIXATION (ORIF) LEFT HUMERUS FRACTURE;  Surgeon: Dominica Severin, MD;  Location: [facility];  Service: Orthopedics;  Laterality: Left;   ROBOTIC ASSISTED TOTAL HYSTERECTOMY WITH BILATERAL SALPINGO OOPHERECTOMY  Bilateral 12/29/2020   Procedure: XI ROBOTIC ASSISTED TOTAL HYSTERECTOMY WITH BILATERAL SALPINGO OOPHORECTOMY;  Surgeon: Adolphus Birchwood, MD;  Location: [facility];  Service: Gynecology;  Laterality: Bilateral;   SENTINEL NODE BIOPSY N/A 12/29/2020   Procedure: SENTINEL NODE BIOPSY;  Surgeon: Adolphus Birchwood, MD;  Location: [facility];  Service: Gynecology;  Laterality: N/A;   TOTAL KNEE ARTHROPLASTY Right 10/07/2017   Procedure: RIGHT TOTAL KNEE ARTHROPLASTY;  Surgeon: Ollen Gross, MD;  Location: [facility];  Service: Orthopedics;  Laterality: Right;  Adductor Block   UMBILICAL HERNIA REPAIR  05/20/06     Past OB/GYN History: OB History  No obstetric history on file.    Vaginal deliveries: ***,  Forceps/ Vacuum deliveries: ***, Cesarean section: *** Menopausal: {menopausal:24763} Contraception: ***. Last pap smear was ***.  Any history of abnormal pap smears: {yes/no:19897}. No results found for: "DIAGPAP", "HPVHIGH", "ADEQPAP"  Medications: Patient has a current medication list which includes the following prescription(s): acetaminophen, albuterol, benzonatate, budesonide-formoterol, buspirone, calcium carbonate-vit d-min, vitamin d3, clobetasol ointment, duloxetine, furosemide, levothyroxine, lidocaine-prilocaine, losartan-hydrochlorothiazide, mirabegron er, fish oil, oxybutynin, pantoprazole, valacyclovir, and cyanocobalamin.   Allergies: Patient is allergic to tramadol.   Social History:  Social History   Tobacco Use   Smoking status: Never   Smokeless tobacco: Never  Vaping Use   Vaping status: Never Used  Substance Use Topics   Alcohol use: No   Drug use: No    Relationship status: {relationship status:24764} Patient lives with ***.   Patient {ACTION; IS/IS GNF:62130865} employed ***. Regular exercise: {Yes/No:304960894} History of abuse: {Yes/No:304960894}  Family History:   Family History  Problem Relation Age of Onset   Colon cancer Mother 12   Heart attack Father    Heart  disease Sister    Breast cancer Sister 29   Breast cancer Sister        dx 85s   Cancer Maternal Aunt 87       "female cancer", unknown type   Cancer Maternal Grandmother        unknown type; dx 89s   Cancer Maternal Grandfather        unknown type; dx 31s   Cancer Cousin        maternal female cousin; dx 23s; unknown type   Cancer Cousin        maternal female cousin; dx 71s; unknown type   Cancer Cousin        maternal female cousin; dx after 56; unknown type   Esophageal cancer Neg Hx    Stomach cancer Neg Hx    Rectal cancer Neg Hx      Review of Systems: ROS   OBJECTIVE Physical Exam: There were no vitals filed for this visit.  Physical Exam   GU / Detailed Urogynecologic Evaluation:  Pelvic Exam: Normal external female genitalia; Bartholin's and Skene's glands normal in appearance; urethral meatus normal in appearance, no urethral masses or discharge.   CST: {gen negative/positive:315881}  Reflexes: bulbocavernosis {DESC; PRESENT/NOT PRESENT:21021351}, anocutaneous {DESC; PRESENT/NOT PRESENT:21021351} ***bilaterally.  Speculum exam reveals normal vaginal mucosa {With/Without:20273} atrophy. Cervix {exam;  gyn cervix:30847}. Uterus {exam; pelvic uterus:30849}. Adnexa {exam; adnexa:12223}.    s/p hysterectomy: Speculum exam reveals normal vaginal mucosa {With/Without:20273}  atrophy and normal vaginal cuff.  Adnexa {exam; adnexa:12223}.    With apex supported, anterior compartment defect was {reduced:24765}  Pelvic floor strength {Roman # I-V:19040}/V, puborectalis {Roman # I-V:19040}/V external anal sphincter {Roman # I-V:19040}/V  Pelvic floor musculature: Right levator {Tender/Non-tender:20250}, Right obturator {Tender/Non-tender:20250}, Left levator {Tender/Non-tender:20250}, Left obturator {Tender/Non-tender:20250}  POP-Q:   POP-Q                                               Aa                                               Ba                                                  C                                                Gh                                               Pb                                               tvl                                                Ap                                               Bp                                                 D      Rectal Exam:  Normal sphincter tone, {rectocele:24766} distal rectocele, enterocoele {DESC; PRESENT/NOT PRESENT:21021351}, no rectal masses, {sign of:24767} dyssynergia when asking the patient to bear down.  Post-Void Residual (PVR) by Bladder Scan: In order to evaluate bladder emptying, we discussed obtaining a postvoid residual and patient agreed to this procedure.  Procedure: The ultrasound unit was placed on the patient's abdomen in the suprapubic region after the patient had voided.      Laboratory Results: Lab Results  Component Value Date  COLORU Yellow 11/07/2022   CLARITYU clear 12/29/2014   GLUCOSEUR negative 12/29/2014   BILIRUBINUR Negative 11/07/2022   KETONESU Negative 11/07/2022   SPECGRAV 1.025 11/07/2022   RBCUR negative 12/29/2014   PHUR 7.0 11/07/2022   PROTEINUR 2+ (A) 11/07/2022   UROBILINOGEN negative 12/29/2014   LEUKOCYTESUR 3+ (A) 11/07/2022    Lab Results  Component Value Date   CREATININE 0.77 03/05/2023   CREATININE 0.75 08/21/2022   CREATININE 0.65 02/06/2022    Lab Results  Component Value Date   HGBA1C 5.9 (H) 03/05/2023    Lab Results  Component Value Date   HGB 13.6 03/05/2023     ASSESSMENT AND PLAN Stacie Russell is a 72 y.o. with:  1. Urinary frequency       Loleta Chance, MD

## 2023-06-13 ENCOUNTER — Ambulatory Visit (INDEPENDENT_AMBULATORY_CARE_PROVIDER_SITE_OTHER): Payer: HMO | Admitting: Obstetrics

## 2023-06-13 ENCOUNTER — Other Ambulatory Visit (HOSPITAL_COMMUNITY)
Admission: RE | Admit: 2023-06-13 | Discharge: 2023-06-13 | Disposition: A | Discharge status: Home / Self Care | Payer: HMO | Source: Ambulatory Visit | Attending: Obstetrics | Admitting: Obstetrics

## 2023-06-13 ENCOUNTER — Encounter: Payer: Self-pay | Admitting: Obstetrics

## 2023-06-13 VITALS — BP 113/71 | HR 90 | Ht 59.45 in | Wt 235.0 lb

## 2023-06-13 DIAGNOSIS — N3946 Mixed incontinence: Secondary | ICD-10-CM

## 2023-06-13 DIAGNOSIS — R300 Dysuria: Secondary | ICD-10-CM | POA: Diagnosis not present

## 2023-06-13 DIAGNOSIS — K5909 Other constipation: Secondary | ICD-10-CM | POA: Diagnosis not present

## 2023-06-13 DIAGNOSIS — N898 Other specified noninflammatory disorders of vagina: Secondary | ICD-10-CM | POA: Diagnosis not present

## 2023-06-13 DIAGNOSIS — N952 Postmenopausal atrophic vaginitis: Secondary | ICD-10-CM

## 2023-06-13 DIAGNOSIS — R82998 Other abnormal findings in urine: Secondary | ICD-10-CM | POA: Diagnosis not present

## 2023-06-13 LAB — POCT URINALYSIS DIPSTICK
Bilirubin, UA: NEGATIVE
Glucose, UA: NEGATIVE
Ketones, UA: NEGATIVE
Nitrite, UA: NEGATIVE
Protein, UA: NEGATIVE
Spec Grav, UA: 1.01 (ref 1.010–1.025)
Urobilinogen, UA: 0.2 U/dL
pH, UA: 6 (ref 5.0–8.0)

## 2023-06-13 MED ORDER — GEMTESA 75 MG PO TABS
75.0000 mg | ORAL_TABLET | Freq: Every day | ORAL | Status: DC
Start: 2023-06-13 — End: 2023-07-01

## 2023-06-13 MED ORDER — PHENAZOPYRIDINE HCL 200 MG PO TABS
200.0000 mg | ORAL_TABLET | Freq: Three times a day (TID) | ORAL | 0 refills | Status: DC | PRN
Start: 2023-06-13 — End: 2023-08-13

## 2023-06-13 MED ORDER — ESTRADIOL 0.1 MG/GM VA CREA: 0.5000 g | TOPICAL_CREAM | VAGINAL | 3 refills | Status: DC

## 2023-06-13 MED ORDER — GEMTESA 75 MG PO TABS
75.0000 mg | ORAL_TABLET | Freq: Every day | ORAL | 2 refills | Status: DC
Start: 2023-06-13 — End: 2023-07-01

## 2023-06-13 NOTE — Assessment & Plan Note (Signed)
-   pending Nuswab, no discharge or bleeding noted on exam

## 2023-06-13 NOTE — Assessment & Plan Note (Addendum)
-   pt to request ROI from Dr. 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  While we do not know the exact etiology of OAB, several treatment options exist. We discussed management including behavioral therapy (decreasing bladder irritants, urge suppression strategies, timed voids, bladder retraining), physical therapy, medication; for refractory cases posterior tibial nerve stimulation, sacral neuromodulation, and intravesical botulinum toxin injection.  For anticholinergic medications, we discussed the potential side effects of anticholinergics including dry eyes, dry mouth, constipation, cognitive impairment and urinary retention. Discontinue oxybutynin For Beta-3 agonist medication, we discussed the potential side effect of elevated blood pressure which is more likely to occur in individuals with uncontrolled hypertension. - samples and Rx gemtesa provided to start - For treatment of stress urinary incontinence,  non-surgical options include expectant management, weight loss, physical therapy, as well as a pessary.  Surgical options include a midurethral sling, Burch urethropexy, and transurethral injection of a bulking agent. - reviewed risks and benefits of botox injection, SNM for OAB, periurethral bulking and midurethral sling for SUI. Encouraged pt to review and consider options if refractory to medications and treatment of constipation

## 2023-06-13 NOTE — Patient Instructions (Addendum)
We discussed the symptoms of overactive bladder (OAB), which include urinary urgency, urinary frequency, night-time urination, with or without urge incontinence.  We discussed management including behavioral therapy (decreasing bladder irritants by following a bladder diet, urge suppression strategies, timed voids, bladder retraining), physical therapy, medication; and for refractory cases posterior tibial nerve stimulation, sacral neuromodulation, and intravesical botulinum toxin injection.   For Beta-3 agonist medication, we discussed the potential side effect of elevated blood pressure which is more likely to occur in individuals with uncontrolled hypertension. You were given samples for Gemtesa 75 mg.  It can take a month to start working so give it time, but if you have bothersome side effects call sooner and we can try a different medication.  Call us if you have trouble filling the prescription or if it's not covered by your insurance.  For refractory OAB we reviewed the procedure for intravesical Botox injection with cystoscopy in the office and reviewed the risks, benefits and alternatives of treatment including but not limited to infection, need for self-catheterization and need for repeat therapy.  We discussed that there is a 5-15% chance of needing to catheterize with Botox and that this usually resolves in a few months; however can persist for longer periods of time.  Typically Botox injections would need to be repeated every 3-12 months since this is not a permanent therapy.   We discussed the role of sacral neuromodulation and how it works. It requires a test phase, and documentation of bladder function via diary. After a successful test period, a permanent wire and generator are placed in the OR. The battery lasts 5 years on average and would need to be replaced surgically.  The goal of this therapy is at least a 50% improvement in symptoms. It is NOT realistic to expect a 100% cure.  We  reviewed the fact that about 30% of patients fail the test phase and are not candidates for permanent generator placement.  We discussed the risk of infection and that the patient would not be able to get an MRI once the device is placed. There are two companies that provide this therapy: [employer] and [employer]. [employer]' product is new and is similar to [employer]'s, but has advantages of a smaller and rechargeable battery and being able to have an MRI with the implant. For all procedures, we discussed risks of bleeding, infection, damage to surrounding organs including bowel, bladder, blood vessels, ureters and nerves, need for further surgery, risk of postoperative urinary incontinence or retention with need to catheterize, recurrent prolapse, numbness and weakness at any body site, buttock pain, and the rarer risks of blood clot, heart attack, pneumonia, death.    We also discussed the role of percutaneous tibial nerve stimulation and how it works.  She understands it requires 12 weekly visits for temporary neuromodulation of the sacral nerve roots via the tibial nerve and that she may then require continued tapered treatment.  She will return for the procedure. All questions were answered.   For treatment of stress urinary incontinence, which is leakage with physical activity/movement/strainging/coughing, we discussed expectant management versus nonsurgical options versus surgery. Nonsurgical options include weight loss, physical therapy, as well as a pessary.  Surgical options include a midurethral sling, which is a synthetic mesh sling that acts like a hammock under the urethra to prevent leakage of urine, a Burch urethropexy, and transurethral injection of a bulking agent.   Constipation: Our goal is to achieve formed bowel movements daily or every-other-day.  You may  need to try different combinations of the following options to find what works best for you - everybody's body works differently so feel  free to adjust the dosages as needed.  Some options to help maintain bowel health include:  Dietary changes (more leafy greens, vegetables and fruits; less processed foods) Fiber supplementation (Benefiber, FiberCon, Metamucil or Psyllium). Start slow and increase gradually to full dose. Over-the-counter agents such as: stool softeners (Docusate or Colace) and/or laxatives (Miralax, milk of magnesia)  "Power Pudding" is a natural mixture that may help your constipation.  To make blend 1 cup applesauce, 1 cup wheat bran, and 3/4 cup prune juice, refrigerate and then take 1 tablespoon daily with a large glass of water as needed.   For vaginal atrophy (thinning of the vaginal tissue that can cause dryness and burning) and UTI prevention we discussed estrogen replacement in the form of vaginal cream.   Start vaginal estrogen therapy nightly for two weeks then 2 times weekly at night. This can be placed with your finger or an applicator inside the vagina and around the urethra.  Please let us know if the prescription is too expensive and we can look for alternative options.   Is vaginal estrogen therapy safe for me? Vaginal estrogen preparations act on the vaginal skin, and only a very tiny amount is absorbed into the bloodstream (0.01%).  They work in a similar way to hand or face cream.  There is minimal absorption and they are therefore perfectly safe. If you have had breast cancer and have persistent troublesome symptoms which aren't settling with vaginal moisturisers and lubricants, local estrogen treatment may be a possibility, but consultation with your oncologist should take place first.   Start vaginal estrogen therapy nightly for two weeks then 2 times weekly at night for treatment of vaginal atrophy (dryness of the vaginal tissues).  Please let us know if the prescription is too expensive and we can look for alternative options.

## 2023-06-13 NOTE — Assessment & Plan Note (Addendum)
-   chronic constipation exacerbated by anti-cholinergics - reviewed association with urinary symptoms, discontinued oxybutynin - For constipation, we reviewed the importance of a better bowel regimen.  We also discussed the importance of avoiding chronic straining, as it can exacerbate her pelvic floor symptoms; we discussed treating constipation and straining prior to surgery, as postoperative straining can lead to damage to the repair and recurrence of symptoms. We discussed initiating therapy with increasing fluid intake, fiber supplementation, stool softeners, and laxatives such as miralax.

## 2023-06-13 NOTE — Progress Notes (Signed)
New Patient Evaluation and Consultation  Referring Provider: Carver Fila, MD PCP: Raliegh Ip, DO Date of Service: 06/13/2023  SUBJECTIVE Chief Complaint: New Patient (Initial Visit) Stacie Russell is a 72 y.o. female here today for incontinence./)  History of Present Illness: Stacie Russell is a 72 y.o. 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    Reports worsening urinary frequency and stress incontinence, uses oxybutynin with minimal relief with history of constipation Evaluated by Dr. Annabell Howells in the past around 5-6 years ago and tried Kegels, pelvic floor PT with minimal relief, and Toviaz, Ditropan, mirabegron medications with some relief. 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 Andrey Farmer.  Underwent chemotherapy with port in place  Review of records significant for: Spinal stenosis managed with extra strength tylenol 2x/month  Urinary Symptoms: Leaks urine with cough/ sneeze, laughing, exercise, lifting, going from sitting to standing, with a full bladder, with movement to the bathroom, with urgency, without sensation, while asleep, and continuously Leaks with urgency 5-6x/day  Leaks 3-4 time(s) per days with small volume leakage due to recent bronchitis  Pad use:  5-6  pads per day.   Patient is bothered by UI symptoms.  Day time voids 7-10.  Nocturia: 2-4 times per night to void with unclear history of OSA and did not start Bipap use  Avoids fluid intake after 6-7 pm Lasix use every 3 months PRN LE swelling Voiding dysfunction:  empties bladder well.  Patient does not use a catheter to empty bladder.  When urinating, patient feels a weak stream, difficulty starting urine stream, and the need to urinate multiple  times in a row Drinks: 32-48oz water per day, sprite 1-2x/week  UTIs:  0  UTI's in the last year.  Reports burning this morning.  Denies history of kidney or bladder stones 40 years ago required surgical intervention   Pelvic Organ Prolapse Symptoms:                  Patient Denies a feeling of a bulge the vaginal area.  Bowel Symptom: Bowel movements: 2-3 time(s) per week with history of chronic constipation and diverticulosis with history of anal fissure repair. Reports exacerbation during chemotherapy Stool consistency: hard or soft  Straining: yes.  Splinting: yes.  Incomplete evacuation: yes.  Patient Denies accidental bowel leakage / fecal incontinence Bowel regimen: none Miralax PRN use  Last colonoscopy: [facility]          Colonoscopy (Every 5 Years) Next due on 06/27/2026    06/27/2021  COLONOSCOPY   Only the first 1 history entries have been loaded, but more history exists.            Sexual Function Sexually active: no.  Sexual orientation: Straight Pain with sex: Yes, at the vaginal opening, has discomfort due to dryness  Pelvic Pain Denies pelvic pain   Past Medical History:  Past Medical History:  Diagnosis Date   Arthritis    Asthma    Diverticulosis    Dyspnea    with exertion    Endometrial hyperplasia    Esophageal stricture    Family history of breast cancer 01/26/2021   Foot pain    Fundic gland polyps of stomach, benign 07/10/2015   Gastritis    GERD (gastroesophageal reflux disease)  Hemorrhoid    Hiatal hernia    Hip pain    History of kidney stones    cystoscopy   History of surgery on arm    left humerus   Hyperlipidemia    Hypertension    Hypothyroidism    Knee pain    Metabolic syndrome    Obstructive sleep apnea 08/27/2017   pt denies    Oxygen deficiency    pt denies   PONV (postoperative nausea and vomiting)    Spinal headache    few headaches recently    Tendonitis of ankle    Vitamin D deficiency       Past Surgical History:   Past Surgical History:  Procedure Laterality Date   ANAL FISSURE REPAIR     CATARACT EXTRACTION  May 2009   COLONOSCOPY  12/26/07   ESOPHAGEAL DILATION     ESOPHAGOGASTRODUODENOSCOPY  12/26/07, 05/13/08   EYE SURGERY Bilateral    bilateral cataract with lens implants   IR IMAGING GUIDED PORT INSERTION  01/25/2021   KNEE CARTILAGE SURGERY Right Sept 1999   ORIF HUMERUS FRACTURE Left 01/12/2020   Procedure: OPEN REDUCTION INTERNAL FIXATION (ORIF) LEFT HUMERUS FRACTURE;  Surgeon: Dominica Severin, MD;  Location: [facility];  Service: Orthopedics;  Laterality: Left;   ROBOTIC ASSISTED TOTAL HYSTERECTOMY WITH BILATERAL SALPINGO OOPHERECTOMY Bilateral 12/29/2020   Procedure: XI ROBOTIC ASSISTED TOTAL HYSTERECTOMY WITH BILATERAL SALPINGO OOPHORECTOMY;  Surgeon: Adolphus Birchwood, MD;  Location: [facility];  Service: Gynecology;  Laterality: Bilateral;   SENTINEL NODE BIOPSY N/A 12/29/2020   Procedure: SENTINEL NODE BIOPSY;  Surgeon: Adolphus Birchwood, MD;  Location: [facility];  Service: Gynecology;  Laterality: N/A;   TOTAL KNEE ARTHROPLASTY Right 10/07/2017   Procedure: RIGHT TOTAL KNEE ARTHROPLASTY;  Surgeon: Ollen Gross, MD;  Location: [facility];  Service: Orthopedics;  Laterality: Right;  Adductor Block   UMBILICAL HERNIA REPAIR  05/20/06     Past OB/GYN History: OB History  Gravida Para Term Preterm AB Living  2 2 2     2   SAB IAB Ectopic Multiple Live Births          2    # Outcome Date GA Lbr Len/2nd Weight Sex Type Anes PTL Lv  2 Term     M Vag-Spont   LIV  1 Term     F Vag-Spont   LIV    Vaginal deliveries: 2, episiotomy. Largest infant 8lb 15oz Forceps/ Vacuum deliveries: 0, Cesarean section: 0 Menopausal: Yes, at age 6, Denies vaginal bleeding since menopause after surgery Contraception: none. Last pap smear was 2022.  Any history of abnormal pap smears: no.  Medications: Patient has a current medication list which includes the following prescription(s): acetaminophen,  albuterol, benzonatate, budesonide-formoterol, buspirone, calcium carbonate-vit d-min, vitamin d3, clobetasol ointment, duloxetine, estradiol, furosemide, levothyroxine, lidocaine-prilocaine, losartan-hydrochlorothiazide, fish oil, pantoprazole, phenazopyridine, valacyclovir, gemtesa, gemtesa, and cyanocobalamin.   Allergies: Patient is allergic to tramadol.   Social History:  Social History   Tobacco Use   Smoking status: Never   Smokeless tobacco: Never  Vaping Use   Vaping status: Never Used  Substance Use Topics   Alcohol use: No   Drug use: No    Relationship status: married Patient lives with her husband.   Patient is not employed. Regular exercise: No History of abuse: No  Family History:   Family History  Problem Relation Age of Onset   Colon cancer Mother 50   Heart attack Father    Heart disease Sister  Breast cancer Sister 29   Breast cancer Sister        dx 75s   Cancer Maternal Aunt 58       "female cancer", unknown type   Cancer Maternal Grandmother        unknown type; dx 52s   Cancer Maternal Grandfather        unknown type; dx 56s   Cancer Cousin        maternal female cousin; dx 77s; unknown type   Cancer Cousin        maternal female cousin; dx 61s; unknown type   Cancer Cousin        maternal female cousin; dx after 40; unknown type   Esophageal cancer Neg Hx    Stomach cancer Neg Hx    Rectal cancer Neg Hx      Review of Systems: Review of Systems  Constitutional:  Negative for fever, malaise/fatigue and weight loss.  Respiratory:  Positive for cough (recent bronchitis). Negative for shortness of breath and wheezing.   Cardiovascular:  Positive for leg swelling. Negative for chest pain and palpitations.  Gastrointestinal:  Positive for constipation. Negative for abdominal pain and blood in stool.  Genitourinary:  Positive for dysuria, frequency and urgency. Negative for hematuria.  Skin:  Negative for rash.  Neurological:  Negative for  dizziness, weakness and headaches.  Endo/Heme/Allergies:  Bruises/bleeds easily.  Psychiatric/Behavioral:  Negative for depression. The patient is not nervous/anxious.      OBJECTIVE Physical Exam: Vitals:   06/13/23 1437  BP: 113/71  Pulse: 90  Weight: 235 lb (106.6 kg)  Height: 4' 11.45" (1.51 m)    Physical Exam Constitutional:      General: She is not in acute distress.    Appearance: Normal appearance.  Genitourinary:     Bladder and urethral meatus normal.     No lesions in the vagina.     Genitourinary Comments: Limited bimanual exam due to habitus     Right Labia: No rash, tenderness, lesions, skin changes or Bartholin's cyst.    Left Labia: No tenderness, lesions, skin changes, Bartholin's cyst or rash.    No vaginal discharge, erythema, tenderness, bleeding, ulceration or granulation tissue.     Moderate vaginal atrophy present.     Right Adnexa: not tender, not full and no mass present.    Left Adnexa: not tender, not full and no mass present.    Cervix is absent.     Uterus is absent.     Urethral meatus caruncle not present.    Urethral stress urinary incontinence with cough stress test (positive with valsalva) present.     No urethral prolapse, tenderness, mass, hypermobility or discharge present.     Bladder is not tender, urgency on palpation not present and masses not present.      Pelvic Floor: Levator muscle strength is 4/5.    Levator ani not tender, obturator internus not tender, no asymmetrical contractions present and no pelvic spasms present.    Anal wink absent and BC reflex absent.     Symmetrical pelvic sensation. Cardiovascular:     Rate and Rhythm: Normal rate.  Pulmonary:     Effort: Pulmonary effort is normal. No respiratory distress.  Abdominal:     General: There is no distension.     Palpations: There is no mass.     Tenderness: There is no abdominal tenderness.     Hernia: No hernia is present.    Neurological:  Mental Status:  She is alert.  Vitals reviewed. Exam conducted with a chaperone present.     POP-Q:   POP-Q  -3                                            Aa   -3                                           Ba  -5                                              C   2                                            Gh  3                                            Pb  7                                            tvl   -3                                            Ap  -3                                            Bp                                                 D    Post-Void Residual (PVR) by Bladder Scan: In order to evaluate bladder emptying, we discussed obtaining a postvoid residual and patient agreed to this procedure.  Procedure: The ultrasound unit was placed on the patient's abdomen in the suprapubic region after the patient had voided.    Post Void Residual - 06/13/23 1453       Post Void Residual   Post Void Residual 45 mL              Laboratory Results: Lab Results  Component Value Date   COLORU yellow 06/13/2023   CLARITYU cloudy 06/13/2023   GLUCOSEUR Negative 06/13/2023   BILIRUBINUR negative 06/13/2023   KETONESU negative 06/13/2023   SPECGRAV 1.010 06/13/2023   RBCUR trace 06/13/2023   PHUR 6.0 06/13/2023   PROTEINUR Negative 06/13/2023   UROBILINOGEN 0.2 06/13/2023   LEUKOCYTESUR Moderate (2+) (A) 06/13/2023    Lab Results  Component Value Date   CREATININE 0.77 03/05/2023   CREATININE 0.75 08/21/2022   CREATININE 0.65 02/06/2022    Lab Results  Component Value Date   HGBA1C 5.9 (H) 03/05/2023    Lab Results  Component Value Date   HGB 13.6 03/05/2023     ASSESSMENT AND PLAN Ms. Ransom is a 72 y.o. with:  1. Dysuria   2. Vaginal itching   3. Vaginal atrophy   4. Other constipation   5. Mixed stress and urge urinary incontinence     Dysuria Assessment & Plan: - symptoms started today - POCT UA + leuk/heme, pending culture and  micro - Rx pyridium for comfort - pt to contact office if symptoms worsens for treatment - prefer catheterized sample to minimize contamination - start vaginal estrogen to r/o atrophy    Orders: -     Phenazopyridine HCl; Take 1 tablet (200 mg total) by mouth 3 (three) times daily as needed for pain.  Dispense: 10 tablet; Refill: 0 -     Urine Culture; Future -     Urine Microscopic; Future  Vaginal itching -     Cervicovaginal ancillary only  Vaginal atrophy Assessment & Plan: - pending Nuswab, no discharge or bleeding noted on exam   Other constipation Assessment & Plan: - chronic constipation exacerbated by anti-cholinergics - reviewed association with urinary symptoms, discontinued oxybutynin - For constipation, we reviewed the importance of a better bowel regimen.  We also discussed the importance of avoiding chronic straining, as it can exacerbate her pelvic floor symptoms; we discussed treating constipation and straining prior to surgery, as postoperative straining can lead to damage to the repair and recurrence of symptoms. We discussed initiating therapy with increasing fluid intake, fiber supplementation, stool softeners, and laxatives such as miralax.    Mixed stress and urge urinary incontinence Assessment & Plan: - pt to request ROI from Dr. 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  While we do not know the exact etiology of OAB, several treatment options exist. We discussed management including behavioral therapy (decreasing bladder irritants, urge suppression strategies, timed voids, bladder retraining), physical therapy, medication; for refractory cases posterior tibial nerve stimulation, sacral neuromodulation, and intravesical  botulinum toxin injection.  For anticholinergic medications, we discussed the potential side effects of anticholinergics including dry eyes, dry mouth, constipation, cognitive impairment and urinary retention. Discontinue oxybutynin For Beta-3 agonist medication, we discussed the potential side effect of elevated blood pressure which is more likely to occur in individuals with uncontrolled hypertension. - samples and Rx gemtesa provided to start - For treatment of stress urinary incontinence,  non-surgical options include expectant management, weight loss, physical therapy, as well as a pessary.  Surgical options include a midurethral sling, Burch urethropexy, and transurethral injection of a bulking agent. - reviewed risks and benefits of botox injection, SNM for OAB, periurethral bulking and midurethral sling for SUI. Encouraged pt to review and consider options if refractory to medications and treatment of constipation  Orders: -     POCT urinalysis dipstick -     Gemtesa; Take 1 tablet (75 mg total) by mouth daily.  Dispense: 30 tablet; Refill: 2 -     Urine Culture; Future -     Urine Microscopic; Future -     Gemtesa; Take 1 tablet (75  mg total) by mouth daily.  Other orders -     Estradiol; Place 0.5 g vaginally 2 (two) times a week. Place 0.5g nightly for two weeks then twice a week after  Dispense: 30 g; Refill: 3  Time spent: I spent 65 minutes dedicated to the care of this patient on the date of this encounter to include pre-visit review of records, face-to-face time with the patient discussing mixed urinary incontinence, dysuria,  and post visit documentation and ordering medication/ testing.    Loleta Chance, MD

## 2023-06-13 NOTE — Assessment & Plan Note (Signed)
-   symptoms started today - POCT UA + leuk/heme, pending culture and micro - Rx pyridium for comfort - pt to contact office if symptoms worsens for treatment - prefer catheterized sample to minimize contamination - start vaginal estrogen to r/o atrophy

## 2023-06-14 ENCOUNTER — Other Ambulatory Visit: Payer: Self-pay | Admitting: Obstetrics

## 2023-06-14 DIAGNOSIS — N898 Other specified noninflammatory disorders of vagina: Secondary | ICD-10-CM

## 2023-06-14 LAB — CERVICOVAGINAL ANCILLARY ONLY
Bacterial Vaginitis (gardnerella): POSITIVE — AB
Candida Glabrata: NEGATIVE
Candida Vaginitis: NEGATIVE
Comment: NEGATIVE
Comment: NEGATIVE
Comment: NEGATIVE

## 2023-06-14 LAB — URINALYSIS, ROUTINE W REFLEX MICROSCOPIC
Bilirubin Urine: NEGATIVE
Glucose, UA: NEGATIVE mg/dL
Ketones, ur: NEGATIVE mg/dL
Nitrite: NEGATIVE
Protein, ur: NEGATIVE mg/dL
Specific Gravity, Urine: 1.013 (ref 1.005–1.030)
WBC, UA: >50 WBC/hpf (ref 0–5)
pH: 6 (ref 5.0–8.0)

## 2023-06-14 MED ORDER — FLUCONAZOLE 150 MG PO TABS: 150.0000 mg | ORAL_TABLET | Freq: Once | ORAL | 1 refills | Status: AC

## 2023-06-14 NOTE — Addendum Note (Signed)
Addended by: Salina April on: 06/14/2023 11:54 AM   Modules accepted: Orders

## 2023-06-15 LAB — URINE CULTURE

## 2023-06-24 ENCOUNTER — Other Ambulatory Visit: Payer: Self-pay

## 2023-06-24 DIAGNOSIS — N898 Other specified noninflammatory disorders of vagina: Secondary | ICD-10-CM

## 2023-06-24 MED ORDER — FLUCONAZOLE 150 MG PO TABS: 150.0000 mg | ORAL_TABLET | Freq: Once | ORAL | 0 refills | Status: AC

## 2023-06-24 NOTE — Progress Notes (Signed)
Stacie Russell is a 72 y.o. female called in complains of vagina itching with a vengeance.  Another order od diflucan was ordered to tot he pharmacy per the patients request. Dr Olena Leatherwood will be notified

## 2023-06-28 ENCOUNTER — Ambulatory Visit: Payer: HMO | Admitting: Obstetrics

## 2023-07-01 ENCOUNTER — Encounter: Payer: Self-pay | Admitting: Obstetrics

## 2023-07-01 ENCOUNTER — Ambulatory Visit (INDEPENDENT_AMBULATORY_CARE_PROVIDER_SITE_OTHER): Payer: HMO | Admitting: Obstetrics

## 2023-07-01 ENCOUNTER — Other Ambulatory Visit (HOSPITAL_COMMUNITY)
Admission: RE | Admit: 2023-07-01 | Discharge: 2023-07-01 | Disposition: A | Discharge status: Home / Self Care | Payer: HMO | Source: Ambulatory Visit | Attending: Obstetrics | Admitting: Obstetrics

## 2023-07-01 VITALS — BP 125/78 | HR 81

## 2023-07-01 DIAGNOSIS — R300 Dysuria: Secondary | ICD-10-CM | POA: Diagnosis not present

## 2023-07-01 DIAGNOSIS — R102 Pelvic and perineal pain: Secondary | ICD-10-CM | POA: Diagnosis not present

## 2023-07-01 DIAGNOSIS — N898 Other specified noninflammatory disorders of vagina: Secondary | ICD-10-CM | POA: Diagnosis present

## 2023-07-01 DIAGNOSIS — N3946 Mixed incontinence: Secondary | ICD-10-CM

## 2023-07-01 LAB — POCT URINALYSIS DIPSTICK
Bilirubin, UA: NEGATIVE
Blood, UA: NEGATIVE
Glucose, UA: NEGATIVE
Ketones, UA: NEGATIVE
Leukocytes, UA: NEGATIVE
Nitrite, UA: NEGATIVE
Protein, UA: NEGATIVE
Spec Grav, UA: 1.02 (ref 1.010–1.025)
Urobilinogen, UA: 0.2 U/dL
pH, UA: 7 (ref 5.0–8.0)

## 2023-07-01 MED ORDER — TROSPIUM CHLORIDE ER 60 MG PO CP24: 1.0000 | ORAL_CAPSULE | Freq: Every day | ORAL | 2 refills | Status: DC

## 2023-07-01 MED ORDER — TROSPIUM CHLORIDE ER 60 MG PO CP24: 1.0000 | ORAL_CAPSULE | Freq: Every day | ORAL | 0 refills | Status: DC

## 2023-07-01 MED ORDER — LIDOCAINE 5 % EX OINT: 1.0000 | TOPICAL_OINTMENT | CUTANEOUS | 0 refills | Status: AC | PRN

## 2023-07-01 MED ORDER — METRONIDAZOLE 500 MG PO TABS: 500.0000 mg | ORAL_TABLET | Freq: Two times a day (BID) | ORAL | 0 refills | Status: AC

## 2023-07-01 NOTE — Progress Notes (Signed)
[facility] Return Visit  SUBJECTIVE  History of Present Illness: Stacie Russell is a 72 y.o. female seen in follow-up for dysuria, mixed urinary incontinence. Plan at last visit was Gemtesa, urine testing, start vaginal estrogen, discontinue oxybutynin.   Reports burning with urination worsened after last visit.  Continue to experience external vulvar itching, reports some intermittent relief with Diflucan x 2  Started vaginal estrogen, now 1g twice a week. Denies relief of vaginal irritation.  Pyridium with some relief Voids 12x/day Denies relief with PTNS without relief Reports prior evaluation by Dr. Annabell Howells over 10 years ago with urodynamics  Tried Kegels, pelvic floor PT with some relief however discontinued due to cost, PTNS (no relief), and Toviaz, Ditropan with relief, mirabegron medications with some relief.   Past Medical History: Patient  has a past medical history of Arthritis, Asthma, Diverticulosis, Dyspnea, Endometrial hyperplasia, Esophageal stricture, Family history of breast cancer (01/26/2021), Foot pain, Fundic gland polyps of stomach, benign (07/10/2015), Gastritis, GERD (gastroesophageal reflux disease), Hemorrhoid, Hiatal hernia, Hip pain, History of kidney stones, History of surgery on arm, Hyperlipidemia, Hypertension, Hypothyroidism, Knee pain, Metabolic syndrome, Obstructive sleep apnea (08/27/2017), Oxygen deficiency, PONV (postoperative nausea and vomiting), Spinal headache, Tendonitis of ankle, and Vitamin D deficiency.   Past Surgical History: She  has a past surgical history that includes Cataract extraction (May 2009); Knee cartilage surgery (Right, Sept 1999); Umbilical hernia repair (05/20/06); Esophagogastroduodenoscopy (12/26/07, 05/13/08); Colonoscopy (12/26/07); Anal fissure repair; Eye surgery (Bilateral); Total knee arthroplasty (Right, 10/07/2017); Esophageal dilation; ORIF humerus fracture (Left, 01/12/2020); Robotic assisted total hysterectomy  with bilateral salpingo oophorectomy (Bilateral, 12/29/2020); Sentinel node biopsy (N/A, 12/29/2020); and IR IMAGING GUIDED PORT INSERTION (01/25/2021).   Medications: She has a current medication list which includes the following prescription(s): acetaminophen, albuterol, benzonatate, budesonide-formoterol, buspirone, calcium carbonate-vit d-min, vitamin d3, clobetasol ointment, duloxetine, estradiol, furosemide, levothyroxine, lidocaine, losartan-hydrochlorothiazide, metronidazole, fish oil, pantoprazole, phenazopyridine, valacyclovir, cyanocobalamin, and trospium chloride.   Allergies: Patient is allergic to tramadol.   Social History: Patient  reports that she has never smoked. She has never used smokeless tobacco. She reports that she does not drink alcohol and does not use drugs.      OBJECTIVE     Physical Exam: Vitals:   07/01/23 1440  BP: 125/78  Pulse: 81   Physical Exam Constitutional:      General: She is not in acute distress.    Appearance: Normal appearance.  Genitourinary:     Bladder and urethral meatus normal.     No lesions in the vagina.     Right Labia: No rash, tenderness, lesions, skin changes or Bartholin's cyst.    Left Labia: No tenderness, lesions, skin changes, Bartholin's cyst or rash.       No vaginal discharge, erythema, tenderness, bleeding, ulceration or granulation tissue.     Urethral meatus caruncle not present.    No urethral prolapse, tenderness, mass, hypermobility or discharge present.     Bladder is not tender, urgency on palpation not present and masses not present.      Levator ani is tender (left sided).     Obturator internus not tender, no asymmetrical contractions present and no pelvic spasms present.    Symmetrical pelvic sensation, anal wink present and BC reflex present. Cardiovascular:     Rate and Rhythm: Normal rate.  Pulmonary:     Effort: Pulmonary effort is normal. No respiratory distress.  Neurological:     Mental  Status: She is alert.  Vitals reviewed. Exam conducted  with a chaperone present.      ASSESSMENT AND PLAN    Stacie Russell is a 72 y.o. with:  1. Dysuria   2. Vaginal itching   3. Mixed stress and urge urinary incontinence   4. Vulvar pain     Dysuria Assessment & Plan: - symptoms started 06/13/23. POCT UA + leuk/heme, culture with multiple species and micro 0-5 RBC/hpf - Rx pyridium, continue PRN use for comfort - pt to contact office if symptoms worsens for treatment - prefer catheterized sample to minimize contamination - continue vaginal estrogen to r/o atrophy - dilute urine with warm water during void - topical barrier ointment to reduce burning reproducible with vulvar palpation    Orders: -     POCT urinalysis dipstick  Vaginal itching Assessment & Plan: - 06/13/23 Nuswab + BV - s/p diflucan with some relief, Rx metronidazole - repeat Nuswab - no discharge or bleeding noted on exam - continue vaginal estrogen 1g twice a week  Orders: -     metroNIDAZOLE; Take 1 tablet (500 mg total) by mouth 2 (two) times daily for 7 days.  Dispense: 14 tablet; Refill: 0 -     Cervicovaginal ancillary only  Mixed stress and urge urinary incontinence Assessment & Plan: - pt to request ROI from Dr. 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 Gemtesa ($100) with no relief. Rx Trospium for patient to assess cost - We discussed the symptoms of overactive bladder (OAB), which include urinary urgency, urinary frequency, nocturia, with or without urge incontinence.  While we do not know the exact etiology of OAB, several treatment options exist. We discussed management including behavioral therapy (decreasing bladder irritants, urge suppression strategies, timed voids, bladder retraining), physical therapy, medication; for refractory cases posterior tibial nerve stimulation, sacral neuromodulation,  and intravesical botulinum toxin injection.  For anticholinergic medications, we discussed the potential side effects of anticholinergics including dry eyes, dry mouth, constipation, cognitive impairment and urinary retention. Discontinue oxybutynin For Beta-3 agonist medication, we discussed the potential side effect of elevated blood pressure which is more likely to occur in individuals with uncontrolled hypertension. - For treatment of stress urinary incontinence,  non-surgical options include expectant management, weight loss, physical therapy, as well as a pessary.  Surgical options include a midurethral sling, Burch urethropexy, and transurethral injection of a bulking agent. - reviewed risks and benefits of botox injection, SNM for OAB, periurethral bulking and midurethral sling for SUI. Encouraged pt to review and consider options if refractory to medications and treatment of constipation - urodynamics and cystoscopy scheduled if refractory symptoms  Orders: -     Trospium Chloride ER; Take 1 capsule (60 mg total) by mouth daily.  Dispense: 30 capsule; Refill: 0  Vulvar pain Assessment & Plan: - provided handout regarding vulvodynia, reviewed comfort measures and treatment options.  - Rx topical lidocaine 1g PRN pain up to 3x/day - consider Rx Amitriptyline 2.5%/ gabapentin 2.5%/ baclofen 2.5% in vaginal cream. - discussed conservative management options with cold compress after intercourse  - prior pelvic floor PT with relief, cost prohibitive.   Orders: -     Lidocaine; Apply 1 Application topically as needed. Up to 3 times a day  Dispense: 35.44 g; Refill: 0  Time spent: I spent 49 minutes dedicated to the care of this patient on the date of this encounter to include pre-visit review of records, face-to-face time with the patient discussing mixed urinary incontinence,  dysuria, vulvar pain, and post visit documentation and ordering medication/ testing.   Loleta Chance, MD

## 2023-07-01 NOTE — Patient Instructions (Addendum)
Start metronidazole 500mg  twice daily for 7 days by mouth for bacterial vaginosis.   Please assess cost of Trospium at your pharmacy, do not fill if it is too expensive.   Continue vaginal estrogen 1g twice a week.   - discussed proper vulvar care, warm compression, avoid pad use, cotton only underwear and barrier ointment with Vaseline if needed   - dilute urine with warm water during void to reduce burning.   - use pyridium as needed up to 3 times a day for burning   - If no relief, please return for urodynamics and cystoscopy.

## 2023-07-01 NOTE — Assessment & Plan Note (Signed)
-   provided handout regarding vulvodynia, reviewed comfort measures and treatment options.  - Rx topical lidocaine 1g PRN pain up to 3x/day - consider Rx Amitriptyline 2.5%/ gabapentin 2.5%/ baclofen 2.5% in vaginal cream. - discussed conservative management options with cold compress after intercourse  - prior pelvic floor PT with relief, cost prohibitive.

## 2023-07-01 NOTE — Assessment & Plan Note (Signed)
-   symptoms started 06/13/23. POCT UA + leuk/heme, culture with multiple species and micro 0-5 RBC/hpf - Rx pyridium, continue PRN use for comfort - pt to contact office if symptoms worsens for treatment - prefer catheterized sample to minimize contamination - continue vaginal estrogen to r/o atrophy - dilute urine with warm water during void - topical barrier ointment to reduce burning reproducible with vulvar palpation

## 2023-07-01 NOTE — Assessment & Plan Note (Addendum)
-   06/13/23 Nuswab + BV - s/p diflucan with some relief, Rx metronidazole - repeat Nuswab - no discharge or bleeding noted on exam - continue vaginal estrogen 1g twice a week

## 2023-07-01 NOTE — Assessment & Plan Note (Signed)
-   pt to request ROI from Dr. 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 Gemtesa ($100) with no relief. Rx Trospium for patient to assess cost - We discussed the symptoms of overactive bladder (OAB), which include urinary urgency, urinary frequency, nocturia, with or without urge incontinence.  While we do not know the exact etiology of OAB, several treatment options exist. We discussed management including behavioral therapy (decreasing bladder irritants, urge suppression strategies, timed voids, bladder retraining), physical therapy, medication; for refractory cases posterior tibial nerve stimulation, sacral neuromodulation, and intravesical botulinum toxin injection.  For anticholinergic medications, we discussed the potential side effects of anticholinergics including dry eyes, dry mouth, constipation, cognitive impairment and urinary retention. Discontinue oxybutynin For Beta-3 agonist medication, we discussed the potential side effect of elevated blood pressure which is more likely to occur in individuals with uncontrolled hypertension. - For treatment of stress urinary incontinence,  non-surgical options include expectant management, weight loss, physical therapy, as well as a pessary.  Surgical options include a midurethral sling, Burch urethropexy, and transurethral injection of a bulking agent. - reviewed risks and benefits of botox injection, SNM for OAB, periurethral bulking and midurethral sling for SUI. Encouraged pt to review and consider options if refractory to medications and treatment of constipation - urodynamics and cystoscopy scheduled if refractory symptoms

## 2023-07-02 LAB — CERVICOVAGINAL ANCILLARY ONLY
Bacterial Vaginitis (gardnerella): POSITIVE — AB
Candida Glabrata: NEGATIVE
Candida Vaginitis: NEGATIVE
Comment: NEGATIVE
Comment: NEGATIVE
Comment: NEGATIVE

## 2023-07-05 ENCOUNTER — Inpatient Hospital Stay: Payer: HMO

## 2023-07-08 ENCOUNTER — Inpatient Hospital Stay: Payer: HMO | Attending: Gynecologic Oncology

## 2023-07-08 DIAGNOSIS — Z452 Encounter for adjustment and management of vascular access device: Secondary | ICD-10-CM | POA: Diagnosis not present

## 2023-07-08 DIAGNOSIS — Z8542 Personal history of malignant neoplasm of other parts of uterus: Secondary | ICD-10-CM | POA: Insufficient documentation

## 2023-07-08 DIAGNOSIS — C541 Malignant neoplasm of endometrium: Secondary | ICD-10-CM

## 2023-07-08 MED ORDER — SODIUM CHLORIDE 0.9% FLUSH
10.0000 mL | Freq: Once | INTRAVENOUS | Status: AC
  Administered 2023-07-08: 10 mL

## 2023-07-08 MED ORDER — HEPARIN SOD (PORK) LOCK FLUSH 100 UNIT/ML IV SOLN
500.0000 [IU] | Freq: Once | INTRAVENOUS | Status: AC
  Administered 2023-07-08: 500 [IU]

## 2023-07-17 ENCOUNTER — Other Ambulatory Visit (HOSPITAL_COMMUNITY)
Admission: RE | Admit: 2023-07-17 | Discharge: 2023-07-17 | Disposition: A | Discharge status: Home / Self Care | Payer: HMO | Source: Other Acute Inpatient Hospital | Attending: Obstetrics and Gynecology | Admitting: Obstetrics and Gynecology

## 2023-07-17 ENCOUNTER — Ambulatory Visit (INDEPENDENT_AMBULATORY_CARE_PROVIDER_SITE_OTHER): Payer: HMO | Admitting: Obstetrics and Gynecology

## 2023-07-17 ENCOUNTER — Encounter: Payer: Self-pay | Admitting: Obstetrics and Gynecology

## 2023-07-17 VITALS — BP 134/82 | HR 66

## 2023-07-17 DIAGNOSIS — N3281 Overactive bladder: Secondary | ICD-10-CM | POA: Diagnosis not present

## 2023-07-17 DIAGNOSIS — R350 Frequency of micturition: Secondary | ICD-10-CM

## 2023-07-17 DIAGNOSIS — N393 Stress incontinence (female) (male): Secondary | ICD-10-CM | POA: Diagnosis not present

## 2023-07-17 DIAGNOSIS — R82998 Other abnormal findings in urine: Secondary | ICD-10-CM | POA: Insufficient documentation

## 2023-07-17 DIAGNOSIS — N3946 Mixed incontinence: Secondary | ICD-10-CM

## 2023-07-17 LAB — POCT URINALYSIS DIPSTICK
Blood, UA: NEGATIVE
Glucose, UA: NEGATIVE
Ketones, UA: NEGATIVE
Nitrite, UA: NEGATIVE
Protein, UA: NEGATIVE
Spec Grav, UA: 1.015 (ref 1.010–1.025)
Urobilinogen, UA: 0.2 U/dL — NL
pH, UA: 7 (ref 5.0–8.0)

## 2023-07-17 NOTE — Progress Notes (Signed)
[facility] Urogynecology Urodynamics Procedure  Referring Physician: Raliegh Ip, DO Date of Procedure: 07/17/2023  Stacie Russell is a 72 y.o. female who presents for urodynamic evaluation. Indication(s) for study: mixed incontinence  Vital Signs: BP 134/82   Pulse 66   Laboratory Results: A catheterized urine specimen revealed:  Lab Results  Component Value Date   COLORU yellow 07/17/2023   CLARITYU clear 07/17/2023   GLUCOSEUR Negative 07/17/2023   BILIRUBINUR neagtive 07/17/2023   KETONESU negative 07/17/2023   SPECGRAV 1.015 07/17/2023   RBCUR negative 07/17/2023   PHUR 7.0 07/17/2023   PROTEINUR Negative 07/17/2023   UROBILINOGEN 0.2 07/17/2023   LEUKOCYTESUR Trace (A) 07/17/2023      Voiding Diary: Deferred   Procedure Timeout:  The correct patient was verified and the correct procedure was verified. The patient was in the correct position and safety precautions were reviewed based on at the patient's history.  Urodynamic Procedure A 70F dual lumen urodynamics catheter was placed under sterile conditions into the patient's bladder. A 70F catheter was placed into the rectum in order to measure abdominal pressure. EMG patches were placed in the appropriate position.  All connections were confirmed and calibrations/adjusted made. Saline was instilled into the bladder through the dual lumen catheters.  Cough/valsalva pressures were measured periodically during filling.  Patient was allowed to void.  The bladder was then emptied of its residual.  UROFLOW: Revealed a Qmax of 10 mL/sec.  She voided 78 mL and had a residual of 10 mL.  It was a normal pattern and represented normal habits though interpretation limited due to low voided volume. Of note, patient reported that that was "a lot of urine" for her, as she normally leaks so much trying to get to the bathroom that she does not urinate much at all in the toilet  CMG: This was performed with sterile water in  the sitting position at a fill rate of 10-30 mL/min.    First sensation of fullness was 87 mLs,  First urge was 111 mLs,  Strong urge was 126 mLs and  Capacity was 130 mLs  Stress incontinence was demonstrated Highest positive CLPP was 164 cmH20 at 126 ml. Highest positive VLPP was 167 cmH20at 126 ml.   Detrusor function was underactive, with phasic contractions seen.  The first occurred at 74 mL to 60 cm of water and was associated with leakage.  Compliance:  Low. End fill detrusor pressure was 47cmH20.  Calculated compliance was 36mL/cmH20  UPP: MUCP without barrier reduction was 45 cm of water.    MICTURITION STUDY: Voiding was performed without reduction in the sitting position.  Pdet at Qmax was 24.7 cm of water.  Qmax was 11 mL/sec.  It was a normal pattern.  She voided 125 mL and had a residual of 5 mL.  It was not a volitional void, sustained detrusor contraction was present and abdominal straining was not present  EMG: This was performed with patches.  She had voluntary contractions, recruitment with fill was present and urethral sphincter was relaxed with void.  The details of the procedure with the study tracings have been scanned into EPIC.   Urodynamic Impression:  1. Sensation was increased; capacity was reduced 2. Stress Incontinence was demonstrated at normal pressures; 3. Detrusor Overactivity was demonstrated with leakage. 4. Emptying was normal with a elevated PVR, a sustained detrusor contraction present,  abdominal straining not present, normal urethral sphincter activity on EMG.  Plan: - The patient will follow up  to discuss the findings and treatment options.  -We briefly discussed office procedures that could be done to treat both forms of leakage, including bladder botox for the urge incontinence and urethral bulking for the stress incontinence. She is considering these options.  -Has tried PT, Myrbetriq, Oxybutynin, Gemtesa, Ditropan, and Toviaz.

## 2023-07-18 LAB — URINE CULTURE: Culture: NO GROWTH

## 2023-07-24 ENCOUNTER — Telehealth: Payer: Self-pay | Admitting: *Deleted

## 2023-07-24 NOTE — Telephone Encounter (Signed)
Per provider moved appt from 2/7 to 1/24

## 2023-08-12 NOTE — Progress Notes (Signed)
  Urogynecology Return Visit  SUBJECTIVE  History of Present Illness: Stacie Russell is a 72 y.o. female seen in follow-up for mixed urinary incontinence, vaginal atrophy, vulvar pain, and constipation. Plan at last visit was urodynamic testing.   Urodynamic Impression: 07/17/23 Uroflow  - voided 78mL, PVR 10mL. Qmax 73mL/sec (insufficient volume)  CMG 1. Sensation was increased; capacity was reduced at with strong urgency 2. Stress Incontinence was demonstrated at normal pressures, VLPP at . MUCP 45cm H2O 3. Detrusor Overactivity was demonstrated with leakage at 74mL 4. Emptying was normal with a normal PVR 5mL, a sustained detrusor contraction present,  abdominal straining not present, normal urethral sphincter activity on EMG. Qmax 51mL/sec, Pdet at Qmax 25cm H2O.  Resolution of burning with urination after flagyl . Negative urine culture 07/17/23 and UA + trace leuk External vulvar itching with some intermittent relief with Diflucan  x 2 and resolved after vaginal estrogen and flagyl . Did not use Rx topical lidocaine  due to resolution Nuswab + BV s/p flagyl  07/01/23 Continues vaginal estrogen 1g twice a week. Denies relief of vaginal irritation or need for barrier ointment use Prior use of pyridium  with some relief Tried Kegels, pelvic floor PT with some relief however discontinued due to cost and left sided PFM tension on exam, PTNS (no relief), and Toviaz (cost prohibitive), Ditropan  with relief, mirabegron  medications with some relief, and gemtesa  with no relief. Denies using Trospium  due to cost.  Reports loose stool attributed to diet around the holidays, denies FI. Denies fiber supplementation.   Past Medical History: Patient  has a past medical history of Arthritis, Asthma, Diverticulosis, Dyspnea, Endometrial hyperplasia, Esophageal stricture, Family history of breast cancer (01/26/2021), Foot pain, Fundic gland polyps of stomach, benign (07/10/2015),  Gastritis, GERD (gastroesophageal reflux disease), Hemorrhoid, Hiatal hernia, Hip pain, History of kidney stones, History of surgery on arm, Hyperlipidemia, Hypertension, Hypothyroidism, Knee pain, Metabolic syndrome, Obstructive sleep apnea (08/27/2017), Oxygen deficiency, PONV (postoperative nausea and vomiting), Spinal headache, Tendonitis of ankle, and Vitamin D  deficiency.   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   Medications: She has a current medication list which includes the following prescription(s): acetaminophen , albuterol , benzonatate , budesonide -formoterol , buspirone , calcium  carbonate-vit d-min, vitamin d3, clobetasol  ointment, duloxetine , [START ON 08/15/2023] estradiol , furosemide , levothyroxine , lidocaine , losartan -hydrochlorothiazide , nitrofurantoin  (macrocrystal-monohydrate), fish oil, pantoprazole , valacyclovir , and cyanocobalamin.   Allergies: Patient is allergic to tramadol .   Social History: Patient  reports that she has never smoked. She has never used smokeless tobacco. She reports that she does not drink alcohol and does not use drugs.     OBJECTIVE     Physical Exam: Vitals:   08/13/23 1028  BP: 134/84  Pulse: 74   Gen: No apparent distress, A&O x 3.  Detailed Urogynecologic Evaluation:  Deferred. Prior exam showed:  CIC teaching: Reviewed anatomy, handwashing, supplies needed for CIC. After verbal consent was obtained  from the patient for catheterization prior to bladder botox  injection. Patient performed an in and out catheterization with minimal assistance.  The patient tolerated the procedure well.      No data to display  ASSESSMENT AND PLAN    Stacie Russell is a 72 y.o. with:  1. Mixed stress and urge urinary incontinence   2. Vaginal atrophy   3. Other constipation     Mixed stress and urge urinary incontinence Assessment & Plan: - pt to request ROI from Dr. 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 Gemtesa  ($100) with no relief. Trospium  cost prohibitive - We discussed the symptoms of overactive bladder (OAB), which include urinary urgency, urinary frequency, nocturia, with or without urge incontinence.  While we do not know the exact etiology of OAB, several treatment options exist. We discussed management including behavioral therapy (decreasing bladder irritants, urge suppression strategies, timed voids, bladder retraining), physical therapy, medication; for refractory cases posterior tibial nerve stimulation, sacral neuromodulation, and intravesical botulinum toxin injection. Patient desires to proceed with 3rd line therapy.  - UDS 07/17/23 with increased sensation, decreased capacity at , SUI at and DO. Pressure flow with normal PVR, normal detrusor activity and synergic EMG.  - For refractory OAB we reviewed the procedure for intravesical Botox  injection with cystoscopy in the office and reviewed the risks, benefits and alternatives of treatment including but not limited to infection, need for self-catheterization and need for repeat therapy.  We discussed that there is a 5-15% chance of needing to catheterize with Botox  and that this usually resolves in a few months; however can persist for longer periods of time.  Typically Botox  injections would need to be repeated every 3-12  months since this is not a permanent therapy.  - We discussed the role of sacral neuromodulation and how it works. It requires a test phase, and documentation of bladder function via diary. After a successful test period, a permanent wire and generator are placed in the OR. The battery lasts 5 years on average and would need to be replaced surgically.  The goal of this therapy is at least a 50% improvement in symptoms. It is NOT realistic to expect a 100% cure.  We reviewed the fact that about 30% of patients fail the test phase and are not candidates for permanent generator placement.  We discussed the risk of infection and that the patient would not be able to get an MRI once the device is placed. There are two companies that provide this therapy: Medtronic and [employer]. [employer]' product is new and is similar to Medtronic's, but has advantages of a smaller and rechargeable battery and being able to have an MRI with the implant. For all procedures, we discussed risks of bleeding, infection, damage to surrounding organs including bowel, bladder, blood vessels, ureters and nerves, need for further surgery, risk of postoperative urinary incontinence or retention with need to catheterize, recurrent prolapse, numbness and weakness at any body site, buttock pain, and the rarer risks of blood clot, heart attack, pneumonia, death.   - underwent CIC teaching, Rx macrobid  to start 3 days prior to botox  injection   - For treatment of stress urinary incontinence,  non-surgical options include expectant management, weight loss, physical therapy, as well as a pessary.  Surgical options include a midurethral sling, Burch urethropexy, and transurethral injection of a bulking agent. - reviewed risks and benefits of botox  injection, SNM for OAB, periurethral bulking and midurethral sling for SUI.  - discussed need for cystoscopy due to refractory symptoms - discussed office procedure with urethral bulking (Bulkamid) due to  mixed urinary incontinence. We discussed success rate of approximately 70-80%  and possible need for second injection. We reviewed that this is not a permanent procedure and the Bulkamid does dissolve over time. Risks reviewed including injury to bladder or urethra, UTI, urinary retention and hematuria.     Vaginal atrophy Assessment & Plan: - For symptomatic vaginal atrophy options include lubrication with a water -based lubricant, personal hygiene measures and barrier protection against wetness, and estrogen replacement in the form of vaginal cream, vaginal tablets, or a time-released vaginal ring.   - continue low dose vaginal estrogen 1g twice a week  Orders: -     Estradiol ; Place 0.5 g vaginally 2 (two) times a week. Place 0.5g twice a week after  Dispense: 30 g; Refill: 3  Other constipation Assessment & Plan: - encouraged to start fiber supplementation with instructions reviewed due to recent changes in loose stool attributed to dietary changes, history of hemorrhoids and diverticulosis - chronic constipation previously exacerbated by anti-cholinergics - reviewed association with urinary symptoms, discontinued oxybutynin  - For constipation, we reviewed the importance of a better bowel regimen.  We also discussed the importance of avoiding chronic straining, as it can exacerbate her pelvic floor symptoms; we discussed treating constipation and straining prior to surgery, as postoperative straining can lead to damage to the repair and recurrence of symptoms. We discussed initiating therapy with increasing fluid intake, fiber supplementation, stool softeners, and laxatives such as miralax .    Other orders -     Nitrofurantoin  Monohyd Macro; Take 1 capsule (100 mg total) by mouth 2 (two) times daily for 5 days. Start 3 days prior to botox  injection  Dispense: 10 capsule; Refill: 0  Time spent: I spent 42 minutes dedicated to the care of this patient on the date of this encounter to include  pre-visit review of records, face-to-face time with the patient discussing mixed urinary incontinence, vaginal atrophy, history of constipation, and post visit documentation and ordering medication/ testing.   Lianne ONEIDA Gillis, MD

## 2023-08-13 ENCOUNTER — Encounter: Payer: Self-pay | Admitting: Obstetrics

## 2023-08-13 ENCOUNTER — Ambulatory Visit (INDEPENDENT_AMBULATORY_CARE_PROVIDER_SITE_OTHER): Payer: HMO | Admitting: Obstetrics

## 2023-08-13 VITALS — BP 134/84 | HR 74

## 2023-08-13 DIAGNOSIS — N3946 Mixed incontinence: Secondary | ICD-10-CM | POA: Diagnosis not present

## 2023-08-13 DIAGNOSIS — N952 Postmenopausal atrophic vaginitis: Secondary | ICD-10-CM | POA: Diagnosis not present

## 2023-08-13 DIAGNOSIS — Z1231 Encounter for screening mammogram for malignant neoplasm of breast: Secondary | ICD-10-CM | POA: Diagnosis not present

## 2023-08-13 DIAGNOSIS — K5909 Other constipation: Secondary | ICD-10-CM | POA: Diagnosis not present

## 2023-08-13 MED ORDER — ESTRADIOL 0.1 MG/GM VA CREA: 0.5000 g | TOPICAL_CREAM | VAGINAL | 3 refills | Status: DC

## 2023-08-13 MED ORDER — NITROFURANTOIN MONOHYD MACRO 100 MG PO CAPS: 100.0000 mg | ORAL_CAPSULE | Freq: Two times a day (BID) | ORAL | 0 refills | Status: AC

## 2023-08-13 NOTE — Patient Instructions (Addendum)
 Please complete your bladder diary for review at your follow-up.   Start macrobid  1 tab twice a day prior to your botox  injection.  For refractory OAB we reviewed the procedure for intravesical Botox  injection with cystoscopy in the office and reviewed the risks, benefits and alternatives of treatment including but not limited to infection, need for self-catheterization and need for repeat therapy.  We discussed that there is a 5-15% chance of needing to catheterize with Botox  and that this usually resolves in a few months; however can persist for longer periods of time.  Typically Botox  injections would need to be repeated every 3-12 months since this is not a permanent therapy.   We discussed the role of sacral neuromodulation and how it works. It requires a test phase, and documentation of bladder function via diary. After a successful test period, a permanent wire and generator are placed in the OR. The battery lasts 5 years on average and would need to be replaced surgically.  The goal of this therapy is at least a 50% improvement in symptoms. It is NOT realistic to expect a 100% cure.  We reviewed the fact that about 30% of patients fail the test phase and are not candidates for permanent generator placement.  We discussed the risk of infection and that the patient would not be able to get an MRI once the device is placed. There are two companies that provide this therapy: [employer] and [employer]. [employer]' product is new and is similar to Medtr[employer]'s, but has advantages of a smaller and rechargeable battery and being able to have an MRI with the implant. For all procedures, we discussed risks of bleeding, infection, damage to surrounding organs including bowel, bladder, blood vessels, ureters and nerves, need for further surgery, risk of postoperative urinary incontinence or retention with need to catheterize, recurrent prolapse, numbness and weakness at any body site, buttock pain, and the rarer risks of  blood clot, heart attack, pneumonia, death.    Start fiber supplementation.  Women should try to eat at least 21 to 25 grams of fiber a day, while men should aim for 30 to 38 grams a day. You can add fiber to your diet with food or a fiber supplement such as psyllium (metamucil), benefiber, or fibercon.   Here's a look at how much dietary fiber is found in some common foods. When buying packaged foods, check the Nutrition Facts label for fiber content. It can vary among brands.  Fruits Serving size Total fiber (grams)*  Raspberries 1 cup 8.0  Pear 1 medium 5.5  Apple, with skin 1 medium 4.5  Banana 1 medium 3.0  Orange 1 medium 3.0  Strawberries 1 cup 3.0   Vegetables Serving size Total fiber (grams)*  Green peas, boiled 1 cup 9.0  Broccoli, boiled 1 cup chopped 5.0  Turnip greens, boiled 1 cup 5.0  Brussels sprouts, boiled 1 cup 4.0  Potato, with skin, baked 1 medium 4.0  Sweet corn, boiled 1 cup 3.5  Cauliflower, raw 1 cup chopped 2.0  Carrot, raw 1 medium 1.5   Grains Serving size Total fiber (grams)*  Spaghetti, whole-wheat, cooked 1 cup 6.0  Barley, pearled, cooked 1 cup 6.0  Bran flakes 3/4 cup 5.5  Quinoa, cooked 1 cup 5.0  Oat bran muffin 1 medium 5.0  Oatmeal, instant, cooked 1 cup 5.0  Popcorn, air-popped 3 cups 3.5  Brown rice, cooked 1 cup 3.5  Bread, whole-wheat 1 slice 2.0  Bread, rye 1 slice 2.0  Legumes, nuts and seeds Serving size Total fiber (grams)*  Split peas, boiled 1 cup 16.0  Lentils, boiled 1 cup 15.5  Black beans, boiled 1 cup 15.0  Baked beans, canned 1 cup 10.0  Chia seeds 1 ounce 10.0  Almonds 1 ounce (23 nuts) 3.5  Pistachios 1 ounce (49 nuts) 3.0  Sunflower kernels 1 ounce 3.0  *Rounded to nearest 0.5 gram. Source: [employer] for [employer], Legacy Release    Continue vaginal estrogen 1g twice a week.

## 2023-08-13 NOTE — Assessment & Plan Note (Signed)
-   For symptomatic vaginal atrophy options include lubrication with a water-based lubricant, personal hygiene measures and barrier protection against wetness, and estrogen replacement in the form of vaginal cream, vaginal tablets, or a time-released vaginal ring.   - continue low dose vaginal estrogen 1g twice a week.

## 2023-08-13 NOTE — Assessment & Plan Note (Signed)
-   encouraged to start fiber supplementation with instructions reviewed due to recent changes in loose stool attributed to dietary changes, history of hemorrhoids and diverticulosis - chronic constipation previously exacerbated by anti-cholinergics - reviewed association with urinary symptoms, discontinued oxybutynin  - For constipation, we reviewed the importance of a better bowel regimen.  We also discussed the importance of avoiding chronic straining, as it can exacerbate her pelvic floor symptoms; we discussed treating constipation and straining prior to surgery, as postoperative straining can lead to damage to the repair and recurrence of symptoms. We discussed initiating therapy with increasing fluid intake, fiber supplementation, stool softeners, and laxatives such as miralax .

## 2023-08-13 NOTE — Assessment & Plan Note (Signed)
-   pt to request ROI from Dr. 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 Gemtesa  ($100) with no relief. Trospium  cost prohibitive - We discussed the symptoms of overactive bladder (OAB), which include urinary urgency, urinary frequency, nocturia, with or without urge incontinence.  While we do not know the exact etiology of OAB, several treatment options exist. We discussed management including behavioral therapy (decreasing bladder irritants, urge suppression strategies, timed voids, bladder retraining), physical therapy, medication; for refractory cases posterior tibial nerve stimulation, sacral neuromodulation, and intravesical botulinum toxin injection. Patient desires to proceed with 3rd line therapy.  - UDS 07/17/23 with increased sensation, decreased capacity at , SUI at and DO. Pressure flow with normal PVR, normal detrusor activity and synergic EMG.  - For refractory OAB we reviewed the procedure for intravesical Botox  injection with cystoscopy in the office and reviewed the risks, benefits and alternatives of treatment including but not limited to infection, need for self-catheterization and need for repeat therapy.  We discussed that there is a 5-15% chance of needing to catheterize with Botox  and that this usually resolves in a few months; however can persist for longer periods of time.  Typically Botox  injections would need to be repeated every 3-12 months since this is not a permanent therapy.  - We discussed the role of sacral neuromodulation and how it works. It requires a test phase, and documentation of bladder function via diary. After a successful test period, a permanent wire and generator are placed in the OR. The battery lasts 5 years on average and would need to be replaced surgically.  The goal of this therapy is at least a 50% improvement in symptoms. It is NOT realistic to expect a 100%  cure.  We reviewed the fact that about 30% of patients fail the test phase and are not candidates for permanent generator placement.  We discussed the risk of infection and that the patient would not be able to get an MRI once the device is placed. There are two companies that provide this therapy: [employer]tronic and [employer]. [employer]' product is new and is similar to Medtronic's, but has advantages of a smaller and rechargeable battery and being able to have an MRI with the implant. For all procedures, we discussed risks of bleeding, infection, damage to surrounding organs including bowel, bladder, blood vessels, ureters and nerves, need for further surgery, risk of postoperative urinary incontinence or retention with need to catheterize, recurrent prolapse, numbness and weakness at any body site, buttock pain, and the rarer risks of blood clot, heart attack, pneumonia, death.   - underwent [facility]IC teaching, Rx macrobid  to start 3 days prior to botox  injection   - For treatment of stress urinary incontinence,  non-surgical options include expectant management, weight loss, physical therapy, as well as a pessary.  Surgical options include a midurethral sling, Burch urethropexy, and transurethral injection of a bulking agent. - reviewed risks and benefits of botox  injection, SNM for OAB, periurethral bulking and midurethral sling for SUI.  - discussed need for cystoscopy due to refractory symptoms - discussed office procedure with urethral bulking (Bulkamid) due to mixed urinary incontinence. We discussed success rate of approximately 70-80% and possible need for second injection. We reviewed that this is not a permanent procedure and the Bulkamid does dissolve over time. Risks reviewed including injury to bladder or urethra, UTI, urinary retention and hematuria.

## 2023-08-15 ENCOUNTER — Ambulatory Visit: Payer: HMO | Admitting: Obstetrics and Gynecology

## 2023-08-20 ENCOUNTER — Ambulatory Visit: Payer: HMO | Admitting: Family Medicine

## 2023-08-20 ENCOUNTER — Encounter: Payer: Self-pay | Admitting: Family Medicine

## 2023-08-20 VITALS — BP 118/67 | HR 70 | Temp 98.4°F | Ht 59.45 in | Wt 243.0 lb

## 2023-08-20 DIAGNOSIS — E039 Hypothyroidism, unspecified: Secondary | ICD-10-CM | POA: Diagnosis not present

## 2023-08-20 DIAGNOSIS — I10 Essential (primary) hypertension: Secondary | ICD-10-CM

## 2023-08-20 DIAGNOSIS — Z23 Encounter for immunization: Secondary | ICD-10-CM | POA: Diagnosis not present

## 2023-08-20 DIAGNOSIS — R7303 Prediabetes: Secondary | ICD-10-CM

## 2023-08-20 DIAGNOSIS — G4733 Obstructive sleep apnea (adult) (pediatric): Secondary | ICD-10-CM | POA: Diagnosis not present

## 2023-08-20 DIAGNOSIS — E782 Mixed hyperlipidemia: Secondary | ICD-10-CM

## 2023-08-20 DIAGNOSIS — Z0001 Encounter for general adult medical examination with abnormal findings: Secondary | ICD-10-CM

## 2023-08-20 DIAGNOSIS — L989 Disorder of the skin and subcutaneous tissue, unspecified: Secondary | ICD-10-CM

## 2023-08-20 DIAGNOSIS — I700 Atherosclerosis of aorta: Secondary | ICD-10-CM

## 2023-08-20 DIAGNOSIS — G4719 Other hypersomnia: Secondary | ICD-10-CM

## 2023-08-20 DIAGNOSIS — C541 Malignant neoplasm of endometrium: Secondary | ICD-10-CM

## 2023-08-20 DIAGNOSIS — Z0000 Encounter for general adult medical examination without abnormal findings: Secondary | ICD-10-CM

## 2023-08-20 NOTE — Progress Notes (Signed)
 Stacie Russell is a 73 y.o. female presents to office today for annual physical exam examination.    Concerns today include: 1.  Fatigue,?  Hypoglycemic episodes She reports that she thinks she may be having some intermittent hypoglycemic episodes.  This seems to happen when she has not eaten for a while where she will become a little bit nauseated and fatigued.  She has not tried checking her blood sugar or blood pressure during these episodes but is willing to do so.  Upon further questioning she admits to snoring.  This has been observed by her husband even.  She notes that she been evaluated several times for obstructive sleep apnea and in 2019 was noted that she had severe obstructive sleep apnea but she denies ever having been told that nor prescribed a CPAP machine.  We discussed this issue back in 2021 but she declined seeing the specialist again for CPAP titration.  At that time she did not want to pursue further testing despite risk of cardiovascular events if left untreated  2.  Bladder incontinence Patient will actually be seeing a specialist on the 22nd of the month for Botox .  She has failed multiple bladder medications and continues to have lots of issues with incontinence etc.  3.  Skin lesions She reports some skin lesions on her right hand and left cheek.  She notes that these seem to be responding to the treatment she received her uterine cancer.  She is worried of course that these are cancerous themselves.  Has not seen a dermatologist for full-body exam but would be interested in doing so.  Occupation: retired, Marital status: married, Substance use: none Health Maintenance Due  Topic Date Due   Zoster Vaccines- Shingrix  (1 of 2) 08/08/1970   COVID-19 Vaccine (3 - Moderna risk series) 09/28/2020   INFLUENZA VACCINE  03/14/2023   MAMMOGRAM  07/17/2023   Refills needed today: none  Immunization History  Administered Date(s) Administered   Fluad Quad(high Dose 65+)  06/16/2019, 08/31/2020, 05/19/2021, 07/18/2022   Influenza Whole 07/25/2012   Influenza, High Dose Seasonal PF 06/11/2017, 06/25/2018   Influenza,inj,Quad PF,6+ Mos 05/06/2013, 06/25/2014, 06/20/2015   Moderna SARS-COV2 Booster Vaccination 08/31/2020   Moderna Sars-Covid-2 Vaccination 09/14/2019, 10/05/2019   Pneumococcal Conjugate-13 05/06/2013   Pneumococcal Polysaccharide-23 09/05/2016   Td 07/18/2022   Tdap 04/26/2011   Zoster, Live 04/26/2011   Past Medical History:  Diagnosis Date   Arthritis    Asthma    Diverticulosis    Dyspnea    with exertion    Endometrial hyperplasia    Esophageal stricture    Family history of breast cancer 01/26/2021   Foot pain    Fundic gland polyps of stomach, benign 07/10/2015   Gastritis    GERD (gastroesophageal reflux disease)    Hemorrhoid    Hiatal hernia    Hip pain    History of kidney stones    cystoscopy   History of surgery on arm    left humerus   Hyperlipidemia    Hypertension    Hypothyroidism    Knee pain    Metabolic syndrome    Obstructive sleep apnea 08/27/2017   pt denies    Oxygen deficiency    pt denies   PONV (postoperative nausea and vomiting)    Spinal headache    few headaches recently    Tendonitis of ankle    Vitamin D  deficiency    Social History   Socioeconomic History   Marital status:  Married    Spouse name: Debby   Number of children: 2   Years of education: 14   Highest education level: Associate degree: academic program  Occupational History   Occupation: Retired  Tobacco Use   Smoking status: Never   Smokeless tobacco: Never  Vaping Use   Vaping status: Never Used  Substance and Sexual Activity   Alcohol use: No   Drug use: No   Sexual activity: Not Currently    Birth control/protection: Post-menopausal  Other Topics Concern   Not on file  Social History Narrative   Married and retired   No EtOH, tobacco or drug use   Social Drivers of Corporate Investment Banker Strain:  Low Risk  (05/23/2023)   Overall Financial Resource Strain (CARDIA)    Difficulty of Paying Living Expenses: Not hard at all  Food Insecurity: No Food Insecurity (05/23/2023)   Hunger Vital Sign    Worried About Running Out of Food in the Last Year: Never true    Ran Out of Food in the Last Year: Never true  Transportation Needs: No Transportation Needs (05/23/2023)   PRAPARE - Administrator, Civil Service (Medical): No    Lack of Transportation (Non-Medical): No  Physical Activity: Inactive (05/23/2023)   Exercise Vital Sign    Days of Exercise per Week: 0 days    Minutes of Exercise per Session: 0 min  Stress: No Stress Concern Present (05/23/2023)   [employer] of Occupational Health - Occupational Stress Questionnaire    Feeling of Stress : Not at all  Social Connections: Socially Integrated (05/23/2023)   Social Connection and Isolation Panel [NHANES]    Frequency of Communication with Friends and Family: More than three times a week    Frequency of Social Gatherings with Friends and Family: More than three times a week    Attends Religious Services: More than 4 times per year    Active Member of Clubs or Organizations: Yes    Attends Banker Meetings: More than 4 times per year    Marital Status: Married  Catering Manager Violence: Not At Risk (05/23/2023)   Humiliation, Afraid, Rape, and Kick questionnaire    Fear of Current or Ex-Partner: No    Emotionally Abused: No    Physically Abused: No    Sexually Abused: No   Past Surgical History:  Procedure Laterality Date   ANAL FISSURE REPAIR     CATARACT EXTRACTION  May 2009   COLONOSCOPY  12/26/07   ESOPHAGEAL DILATION     ESOPHAGOGASTRODUODENOSCOPY  12/26/07, 05/13/08   EYE SURGERY Bilateral    bilateral cataract with lens implants   IR IMAGING GUIDED PORT INSERTION  01/25/2021   KNEE CARTILAGE SURGERY Right Sept 1999   ORIF HUMERUS FRACTURE Left 01/12/2020   Procedure: OPEN REDUCTION  INTERNAL FIXATION (ORIF) LEFT HUMERUS FRACTURE;  Surgeon: Camella Fallow, MD;  Location: [facility] OR;  Service: Orthopedics;  Laterality: Left;   ROBOTIC ASSISTED TOTAL HYSTERECTOMY WITH BILATERAL SALPINGO OOPHERECTOMY Bilateral 12/29/2020   Procedure: XI ROBOTIC ASSISTED TOTAL HYSTERECTOMY WITH BILATERAL SALPINGO OOPHORECTOMY;  Surgeon: Eloy Herring, MD;  Location: [facility];  Service: Gynecology;  Laterality: Bilateral;   SENTINEL NODE BIOPSY N/A 12/29/2020   Procedure: SENTINEL NODE BIOPSY;  Surgeon: Eloy Herring, MD;  Location: [facility];  Service: Gynecology;  Laterality: N/A;   TOTAL KNEE ARTHROPLASTY Right 10/07/2017   Procedure: RIGHT TOTAL KNEE ARTHROPLASTY;  Surgeon: Melodi Lerner, MD;  Location: [facility];  Service: Orthopedics;  Laterality: Right;  Adductor Block   UMBILICAL HERNIA REPAIR  05/20/06   Family History  Problem Relation Age of Onset   Colon cancer Mother 13   Heart attack Father    Heart disease Sister    Breast cancer Sister 68   Breast cancer Sister        dx 20s   Cancer Maternal Aunt 12       female cancer, unknown type   Cancer Maternal Grandmother        unknown type; dx 32s   Cancer Maternal Grandfather        unknown type; dx 23s   Cancer Cousin        maternal female cousin; dx 46s; unknown type   Cancer Cousin        maternal female cousin; dx 62s; unknown type   Cancer Cousin        maternal female cousin; dx after 9; unknown type   Esophageal cancer Neg Hx    Stomach cancer Neg Hx    Rectal cancer Neg Hx     Current Outpatient Medications:    acetaminophen  (TYLENOL ) 500 MG tablet, Take 1,000 mg by mouth as needed (pain.)., Disp: , Rfl:    albuterol  (VENTOLIN  HFA) 108 (90 Base) MCG/ACT inhaler, Inhale 2 puffs into the lungs every 6 (six) hours as needed for wheezing or shortness of breath. (Patient taking differently: Inhale 2 puffs into the lungs as needed for wheezing or shortness of breath. PRN), Disp: 6.7 g, Rfl: 1   benzonatate  (TESSALON  PERLES) 100 MG  capsule, Take 1 capsule (100 mg total) by mouth 3 (three) times daily as needed for cough., Disp: 20 capsule, Rfl: 0   budesonide -formoterol  (SYMBICORT ) 80-4.5 MCG/ACT inhaler, Inhale 2 puffs into the lungs 2 (two) times daily. (Patient taking differently: Inhale 2 puffs into the lungs 2 (two) times daily as needed.), Disp: 3 each, Rfl: 11   busPIRone  (BUSPAR ) 7.5 MG tablet, Take 1 tablet (7.5 mg total) by mouth 2 (two) times daily., Disp: 180 tablet, Rfl: 3   Calcium  Carbonate-Vit D-Min (CALCIUM  1200 PO), Take 1 tablet by mouth in the morning., Disp: , Rfl:    Cholecalciferol  (VITAMIN D3) 2000 units TABS, Take 2,000 Units by mouth in the morning., Disp: , Rfl:    clobetasol  ointment (TEMOVATE ) 0.05 %, Apply 1 Application topically 3 (three) times a week. Apply cream to the area where you have had itching 3 times a week at night., Disp: 30 g, Rfl: 0   DULoxetine  (CYMBALTA ) 30 MG capsule, Take 2 capsules (60 mg total) by mouth daily., Disp: 180 capsule, Rfl: 0   estradiol  (ESTRACE ) 0.1 MG/GM vaginal cream, Place 0.5 g vaginally 2 (two) times a week. Place 0.5g twice a week after, Disp: 30 g, Rfl: 3   furosemide  (LASIX ) 20 MG tablet, TAKE 1 TABLET BY MOUTH DAILY AS  NEEDED FOR FLUID, Disp: 100 tablet, Rfl: 0   levothyroxine  (SYNTHROID ) 75 MCG tablet, Take 1 tablet (75 mcg total) by mouth daily before breakfast., Disp: 90 tablet, Rfl: 3   lidocaine  (XYLOCAINE ) 5 % ointment, Apply 1 Application topically as needed. Up to 3 times a day, Disp: 35.44 g, Rfl: 0   losartan -hydrochlorothiazide  (HYZAAR) 100-12.5 MG tablet, Take 1 tablet by mouth daily., Disp: 100 tablet, Rfl: 0   Omega-3 Fatty Acids (FISH OIL) 1000 MG CAPS, Take 1,000 mg by mouth in the morning., Disp: , Rfl:    pantoprazole  (PROTONIX ) 40 MG tablet, Take 1 tablet (40 mg  total) by mouth 2 (two) times daily before a meal., Disp: 200 tablet, Rfl: 3   valACYclovir  (VALTREX ) 1000 MG tablet, TAKE 1 TABLET BY MOUTH TWICE  DAILY FOR 7 DAYS; IF HAVING   ANOTHER FLARE, TAKE 2 TABLETS  TWICE DAILY FOR 1 DAY ONLY PER  FLAREUP, Disp: 100 tablet, Rfl: 0   vitamin B-12 (CYANOCOBALAMIN) 1000 MCG tablet, Take 1,000 mcg by mouth in the morning., Disp: , Rfl:   Allergies  Allergen Reactions   Tramadol  Other (See Comments)    Unsure of reaction     ROS: Review of Systems Pertinent items noted in HPI and remainder of comprehensive ROS otherwise negative.    Physical exam BP 118/67   Pulse 70   Temp 98.4 F (36.9 C) (Temporal)   Ht 4' 11.45 (1.51 m)   Wt 243 lb (110.2 kg)   SpO2 95%   BMI 48.34 kg/m  General appearance: alert, cooperative, appears stated age, and morbidly obese Head: Normocephalic, without obvious abnormality, atraumatic Eyes: negative findings: lids and lashes normal, conjunctivae and sclerae normal, corneas clear, and pupils equal, round, reactive to light and accomodation Ears: normal TM's and external ear canals both ears Nose: Nares normal. Septum midline. Mucosa normal. No drainage or sinus tenderness. Throat: lips, mucosa, and tongue normal; teeth and gums normal Neck: no adenopathy, supple, symmetrical, trachea midline, and thyroid  not enlarged, symmetric, no tenderness/mass/nodules Back: symmetric, no curvature. ROM normal. No CVA tenderness. Lungs: clear to auscultation bilaterally Heart: regular rate and rhythm, S1, S2 normal, no murmur, click, rub or gallop Abdomen: soft, non-tender; bowel sounds normal; no masses,  no organomegaly Extremities: extremities normal, atraumatic, no cyanosis or edema Pulses: 2+ and symmetric Skin:  Flesh-colored, rough, irregularly shaped lesion noted along the dorsal aspect of the right hand.  She has several pigmented nevi noted along the chest and dcolletage Lymph nodes: Cervical, supraclavicular, and axillary nodes normal. Neurologic: Grossly normal      08/20/2023    9:17 AM 05/23/2023    8:05 AM 03/18/2023   11:26 AM  Depression screen PHQ 2/9  Decreased Interest 1 0 0   Down, Depressed, Hopeless 0 0 0  PHQ - 2 Score 1 0 0  Altered sleeping 0  1  Tired, decreased energy 2  2  Change in appetite 1  0  Feeling bad or failure about yourself  0  0  Trouble concentrating 0  0  Moving slowly or fidgety/restless 0  1  Suicidal thoughts 0  0  PHQ-9 Score 4  4  Difficult doing work/chores   Not difficult at all      03/18/2023   11:26 AM 01/18/2023   10:19 AM 08/07/2022    3:35 PM 08/07/2022    2:51 PM  GAD 7 : Generalized Anxiety Score  Nervous, Anxious, on Edge 1 1 0 1  Control/stop worrying 0 0  0  Worry too much - different things 0 0  1  Trouble relaxing 0 1  1  Restless 0 0  1  Easily annoyed or irritable 1 0  0  Afraid - awful might happen 0 0  0  Total GAD 7 Score 2 2  4   Anxiety Difficulty Not difficult at all   Somewhat difficult   The 10-year ASCVD risk score (Arnett DK, et al., 2019) is: 16.3%   Values used to calculate the score:     Age: 44 years     Sex: Female     Is Non-Hispanic  African American: No     Diabetic: No     Tobacco smoker: No     Systolic Blood Pressure: 118 mmHg     Is BP treated: Yes     HDL Cholesterol: 54 mg/dL     Total Cholesterol: 187 mg/dL   Assessment/ Plan: Stacie Russell CHRISTELLA Pouch here for annual physical exam.   Annual physical exam  Severe obstructive sleep apnea - Plan: Ambulatory referral to Sleep Studies  Excessive daytime sleepiness - Plan: Ambulatory referral to Sleep Studies  Skin lesions - Plan: Ambulatory referral to Dermatology  Acquired hypothyroidism - Plan: TSH + free T4, TSH + free T4, CANCELED: TSH + free T4  Aortic atherosclerosis (HCC) - Plan: CMP14+EGFR, Lipid panel, CMP14+EGFR, Lipid panel, CANCELED: Lipid panel, CANCELED: CMP14+EGFR  Mixed hyperlipidemia - Plan: CMP14+EGFR, Lipid panel, CMP14+EGFR, Lipid panel, CANCELED: Lipid panel, CANCELED: CMP14+EGFR  Essential hypertension, benign - Plan: CMP14+EGFR, CMP14+EGFR, CANCELED: CMP14+EGFR  Endometrial cancer (HCC) - Plan: CBC with  Differential, CMP14+EGFR, CBC with Differential, CMP14+EGFR, CANCELED: CMP14+EGFR, CANCELED: CBC with Differential  Pre-diabetes - Plan: Hemoglobin A1c, Hemoglobin A1c, CANCELED: Bayer DCA Hb A1c Waived  Encounter for immunization - Plan: Flu Vaccine Trivalent High Dose (Fluad)  Thyroid  levels have been ordered and paper copies have been given of all of her labs so that she can have these collected with her next set of labs with hematology/oncology.  She reported quite a bit of excessive daytime fatigue and I think this is likely secondary to the severe sleep apnea that was noted in 2019.  I am placing a new sleep study referral and hope that they can maybe get her set up with a CPAP machine.  I think this will greatly improve her overall fatigue issues as well as maybe aid in some weight loss for her.  We discussed the cardiovascular risks of untreated sleep apnea and she voiced good understanding and was amenable to plan. However, a sample glucometer was provided to the patient so that she can check for any blood sugar drops during the episode she described today.  This was demonstrated to the patient today and she will follow-up with any abnormal blood sugars  I placed a referral to dermatology as well and she will contact us  with the specific dermatology group she would like to see  I highly recommend that she again consider statin therapy for treatment of cholesterol and hopeful reduction in progression of aortic atherosclerosis.  Her CV risk score is 16.3% which of course is quite above acceptable levels and again reinforced need for cholesterol medication initiation.  Her blood pressure is controlled.  No changes are needed.  Continue Hyzaar.  Will plan for A1c given prediabetes noted on last lab draw.  She will continue to follow-up with hematology/oncology for management of her endometrial cancer  Vaccinations were updated today.  We are awaiting the results of her mammogram which was  attained last week.  Counseled on healthy lifestyle choices, including diet (rich in fruits, vegetables and lean meats and low in salt and simple carbohydrates) and exercise (at least 30 minutes of moderate physical activity daily).  Patient to follow up 6-78m  Anetra Czerwinski M. Jolinda, DO

## 2023-08-21 ENCOUNTER — Telehealth: Payer: Self-pay | Admitting: Family Medicine

## 2023-08-21 ENCOUNTER — Other Ambulatory Visit: Payer: Self-pay | Admitting: Family Medicine

## 2023-08-21 ENCOUNTER — Encounter: Payer: Self-pay | Admitting: Family Medicine

## 2023-08-21 DIAGNOSIS — Z6379 Other stressful life events affecting family and household: Secondary | ICD-10-CM

## 2023-08-21 NOTE — Telephone Encounter (Signed)
 Copied from CRM [id number]. Topic: Referral - Request for Referral >> Aug 21, 2023  1:51 PM Antonio H wrote: Patient wants to let Dr. Jolinda know the dermatologist she would like the referral sent to is Dr. Loris Leeds at [facility], number is [phone]

## 2023-08-21 NOTE — Telephone Encounter (Signed)
 This is the dermatologist she wants to see. I put in referral yesterday.

## 2023-08-22 NOTE — Telephone Encounter (Signed)
 I have faxed Referral to Office requested by Patient :)

## 2023-08-26 ENCOUNTER — Telehealth: Payer: Self-pay

## 2023-08-26 NOTE — Telephone Encounter (Signed)
 Attempted to contact Stacie Russell is a 72 y.o. female  to get her new insurance information to check CPT codes for procedure : Urethral Bulking and Botox   Pt was not available. LM on the VM for the pt to upload her new insurace information  so we can check her insurance

## 2023-08-29 ENCOUNTER — Ambulatory Visit (HOSPITAL_COMMUNITY)
Admission: RE | Admit: 2023-08-29 | Discharge: 2023-08-29 | Disposition: A | Discharge status: Home / Self Care | Payer: HMO | Source: Ambulatory Visit | Attending: Hematology and Oncology | Admitting: Hematology and Oncology

## 2023-08-29 ENCOUNTER — Inpatient Hospital Stay: Payer: HMO | Attending: Gynecologic Oncology

## 2023-08-29 DIAGNOSIS — Z90710 Acquired absence of both cervix and uterus: Secondary | ICD-10-CM | POA: Insufficient documentation

## 2023-08-29 DIAGNOSIS — C541 Malignant neoplasm of endometrium: Secondary | ICD-10-CM | POA: Diagnosis not present

## 2023-08-29 DIAGNOSIS — Z9221 Personal history of antineoplastic chemotherapy: Secondary | ICD-10-CM | POA: Insufficient documentation

## 2023-08-29 DIAGNOSIS — K449 Diaphragmatic hernia without obstruction or gangrene: Secondary | ICD-10-CM | POA: Diagnosis not present

## 2023-08-29 DIAGNOSIS — Z90722 Acquired absence of ovaries, bilateral: Secondary | ICD-10-CM | POA: Diagnosis not present

## 2023-08-29 DIAGNOSIS — Z8542 Personal history of malignant neoplasm of other parts of uterus: Secondary | ICD-10-CM | POA: Insufficient documentation

## 2023-08-29 DIAGNOSIS — E66813 Obesity, class 3: Secondary | ICD-10-CM

## 2023-08-29 LAB — CBC WITH DIFFERENTIAL/PLATELET
Abs Immature Granulocytes: 0.01 10*3/uL (ref 0.00–0.07)
Basophils Absolute: 0 10*3/uL (ref 0.0–0.1)
Basophils Relative: 1 %
Eosinophils Absolute: 0.2 10*3/uL (ref 0.0–0.5)
Eosinophils Relative: 3 %
HCT: 40.8 % (ref 36.0–46.0)
Hemoglobin: 13.7 g/dL (ref 12.0–15.0)
Immature Granulocytes: 0 %
Lymphocytes Relative: 29 %
Lymphs Abs: 2.1 10*3/uL (ref 0.7–4.0)
MCH: 31.1 pg (ref 26.0–34.0)
MCHC: 33.6 g/dL (ref 30.0–36.0)
MCV: 92.7 fL (ref 80.0–100.0)
Monocytes Absolute: 0.5 10*3/uL (ref 0.1–1.0)
Monocytes Relative: 7 %
Neutro Abs: 4.3 10*3/uL (ref 1.7–7.7)
Neutrophils Relative %: 60 %
Platelets: 218 10*3/uL (ref 150–400)
RBC: 4.4 MIL/uL (ref 3.87–5.11)
RDW: 13.8 % (ref 11.5–15.5)
WBC: 7.2 10*3/uL (ref 4.0–10.5)
nRBC: 0 % (ref 0.0–0.2)

## 2023-08-29 LAB — COMPREHENSIVE METABOLIC PANEL
ALT: 19 U/L (ref 0–44)
AST: 15 U/L (ref 15–41)
Albumin: 4 g/dL (ref 3.5–5.0)
Alkaline Phosphatase: 95 U/L (ref 38–126)
Anion gap: 4 — ABNORMAL LOW (ref 5–15)
BUN: 20 mg/dL (ref 8–23)
CO2: 30 mmol/L (ref 22–32)
Calcium: 9.4 mg/dL (ref 8.9–10.3)
Chloride: 105 mmol/L (ref 98–111)
Creatinine, Ser: 0.64 mg/dL (ref 0.44–1.00)
GFR, Estimated: >60 mL/min (ref >60)
Glucose, Bld: 93 mg/dL (ref 70–99)
Potassium: 4.1 mmol/L (ref 3.5–5.1)
Sodium: 139 mmol/L (ref 135–145)
Total Bilirubin: 0.4 mg/dL (ref 0.0–1.2)
Total Protein: 7 g/dL (ref 6.5–8.1)

## 2023-08-29 MED ORDER — SODIUM CHLORIDE 0.9% FLUSH
10.0000 mL | Freq: Once | INTRAVENOUS | Status: AC
  Administered 2023-08-29: 10 mL

## 2023-08-29 MED ORDER — IOHEXOL 300 MG/ML  SOLN
30.0000 mL | Freq: Once | INTRAMUSCULAR | Status: AC | PRN
  Administered 2023-08-29: 30 mL via ORAL

## 2023-08-29 MED ORDER — HEPARIN SOD (PORK) LOCK FLUSH 100 UNIT/ML IV SOLN
500.0000 [IU] | Freq: Once | INTRAVENOUS | Status: AC
  Administered 2023-08-29: 500 [IU] via INTRAVENOUS

## 2023-08-29 MED ORDER — HEPARIN SOD (PORK) LOCK FLUSH 100 UNIT/ML IV SOLN
INTRAVENOUS | Status: AC
Start: 2023-08-29 — End: ?
  Filled 2023-08-29: qty 5

## 2023-08-29 MED ORDER — IOHEXOL 300 MG/ML  SOLN
100.0000 mL | Freq: Once | INTRAMUSCULAR | Status: AC | PRN
  Administered 2023-08-29: 100 mL via INTRAVENOUS

## 2023-08-29 MED ORDER — HEPARIN SOD (PORK) LOCK FLUSH 100 UNIT/ML IV SOLN: 500.0000 [IU] | Freq: Once | INTRAVENOUS | Status: DC

## 2023-09-02 ENCOUNTER — Ambulatory Visit: Payer: HMO | Admitting: Family Medicine

## 2023-09-02 NOTE — Progress Notes (Signed)
PA request was faxed to [employer] at [phone] for procedure : UB and Botox CPT code: 29518 and [phone] PA is needed for this procedure. PA request is: PENDING  Fax confirmation received

## 2023-09-03 DIAGNOSIS — G4719 Other hypersomnia: Secondary | ICD-10-CM | POA: Diagnosis not present

## 2023-09-04 DIAGNOSIS — G4719 Other hypersomnia: Secondary | ICD-10-CM | POA: Diagnosis not present

## 2023-09-04 NOTE — Progress Notes (Addendum)
PA has been APPROVED. See scanned document

## 2023-09-05 ENCOUNTER — Encounter: Payer: Self-pay | Admitting: Obstetrics

## 2023-09-05 ENCOUNTER — Inpatient Hospital Stay: Payer: HMO | Admitting: Hematology and Oncology

## 2023-09-05 ENCOUNTER — Ambulatory Visit: Payer: HMO | Admitting: Obstetrics

## 2023-09-05 VITALS — BP 137/89 | HR 73

## 2023-09-05 DIAGNOSIS — N3281 Overactive bladder: Secondary | ICD-10-CM

## 2023-09-05 DIAGNOSIS — N3946 Mixed incontinence: Secondary | ICD-10-CM

## 2023-09-05 LAB — POCT URINALYSIS DIPSTICK
Bilirubin, UA: NEGATIVE
Blood, UA: NEGATIVE
Glucose, UA: NEGATIVE
Ketones, UA: NEGATIVE
Leukocytes, UA: NEGATIVE
Nitrite, UA: NEGATIVE
Protein, UA: NEGATIVE
Spec Grav, UA: 1.015 (ref 1.010–1.025)
Urobilinogen, UA: 0.2 U/dL
pH, UA: 7 (ref 5.0–8.0)

## 2023-09-05 MED ORDER — ONABOTULINUMTOXINA 100 UNITS IJ SOLR
100.0000 [IU] | Freq: Once | INTRAMUSCULAR | Status: AC
  Administered 2023-09-05: 100 [IU] via INTRAMUSCULAR

## 2023-09-05 MED ORDER — LIDOCAINE-EPINEPHRINE 1 %-1:100000 IJ SOLN
8.0000 mL | Freq: Once | INTRAMUSCULAR | Status: AC
  Administered 2023-09-05: 8 mL via INTRADERMAL

## 2023-09-05 MED ORDER — CIPROFLOXACIN HCL 500 MG PO TABS: 500.0000 mg | ORAL_TABLET | Freq: Once | ORAL | Status: DC

## 2023-09-05 MED ORDER — LIDOCAINE HCL (PF) 2 % IJ SOLN: 8.0000 mL | Freq: Once | INTRAMUSCULAR | Status: DC

## 2023-09-05 MED ORDER — LIDOCAINE HCL URETHRAL/MUCOSAL 2 % EX GEL
1.0000 | Freq: Once | CUTANEOUS | Status: AC
  Administered 2023-09-05: 1 via URETHRAL

## 2023-09-05 MED ORDER — LIDOCAINE HCL 2 % IJ SOLN
20.0000 mL | Freq: Once | INTRAMUSCULAR | Status: AC
  Administered 2023-09-05: 400 mg

## 2023-09-05 NOTE — Progress Notes (Signed)
Intravesical Botox injection and Periurethral bulking Procedure:  73 y.o. yo F with mixed urinary incontinence presenting today for intravesical botox injection and periurethral bulking.   Vitals:   09/05/23 1218 09/05/23 1330  BP: (!) 163/85 137/89  Pulse: 68 73    Results for orders placed or performed in visit on 09/05/23 (from the past 24 hours)  POCT Urinalysis Dipstick     Status: None   Collection Time: 09/05/23 12:28 PM  Result Value Ref Range   Color, UA Yellow    Clarity, UA Clear    Glucose, UA Negative Negative   Bilirubin, UA Negative    Ketones, UA Negative    Spec Grav, UA 1.015 1.010 - 1.025   Blood, UA Negative    pH, UA 7.0 5.0 - 8.0   Protein, UA Negative Negative   Urobilinogen, UA 0.2 0.2 or 1.0 E.U./dL   Nitrite, UA Negative    Leukocytes, UA Negative Negative   Appearance     Odor      Procedure:  Prior to the procedure, the patient took cipro at home this morning for antibiotic prophylaxis. Time out was performed. The bladder was catheterized, 50 ml of 2% lidocaine was placed in the bladder and 10 ml of 2% lidocaine jelly placed in the urethra. A urethral block was performed by injecting 3ml of 1% lidocaine with epinephrine at 3 and 9 o'clock adjacent to the urethra.  After 30 minutes the lidocaine was drained. Cystoscopy was performed with sterile H2O and a 70 degree scope. Bladder mucosa was noted to be within normal limits. A total of 10 ml / 100 units of Botox A,  Lot # [id number] Exp 10/2025  was injected in the detrusor muscle via 5 injections of 2ml each. These were spaced about 1 cm apart. Care was taken to avoid the ureteral orifices and the trigone. Injection sites were noted to be hemostatic. The cystoscopy was removed under direct visualization.  The needle was primed.  The urethroscope was inserted to the level of the bladder neck.  The needle was inserted 2 cm and the scope was pulled back into the urethra 2 cm.  The needle was inserted bevel up at  the 5 o'clock position and the Bulkamid was injected to obtain coaptation.  This was repeated at the 2 o'clock,  10 o'clock and 7 o'clock positions.   A total of 2- 1ml syringes were used and good circumferential coaptation was noted.  The patient tolerated the procedure well. She was asked to void after the procedure.  Time out was performed.   Post-Void Residual (PVR) by Bladder Scan: In order to evaluate bladder emptying, we discussed obtaining a postvoid residual and she agreed to this procedure.  Procedure: The ultrasound unit was placed on the patient's abdomen in the suprapubic region after the patient had voided.   Post Void Residual - 09/05/23 1330       Post Void Residual   Post Void Residual 95 mL              ASSESSMENT: 73 y.o. y.o. s/p cystoscopic injection of BOTOX A for detrusor overactivity and transurethral Bulkamid injection for stress incontinence.   PLAN: - Patient will follow up in 4 weeks to reassess. Voiding and post-procedure precautions were given. She will return for heavy bleeding, fevers, dysuria lasting beyond today and incomplete emptying. - Self-catheterization teaching was previously performed.   All questions were answered.  Loleta Chance, MD

## 2023-09-05 NOTE — Patient Instructions (Signed)

## 2023-09-06 ENCOUNTER — Encounter: Payer: Self-pay | Admitting: Hematology and Oncology

## 2023-09-06 ENCOUNTER — Inpatient Hospital Stay (HOSPITAL_BASED_OUTPATIENT_CLINIC_OR_DEPARTMENT_OTHER): Payer: HMO | Admitting: Hematology and Oncology

## 2023-09-06 ENCOUNTER — Inpatient Hospital Stay (HOSPITAL_BASED_OUTPATIENT_CLINIC_OR_DEPARTMENT_OTHER): Payer: HMO | Admitting: Gynecologic Oncology

## 2023-09-06 ENCOUNTER — Encounter: Payer: Self-pay | Admitting: Gynecologic Oncology

## 2023-09-06 VITALS — BP 138/56 | HR 72 | Resp 18 | Ht 59.45 in | Wt 243.6 lb

## 2023-09-06 VITALS — BP 135/77 | HR 70 | Temp 98.7°F | Resp 20 | Wt 241.8 lb

## 2023-09-06 DIAGNOSIS — Z9221 Personal history of antineoplastic chemotherapy: Secondary | ICD-10-CM

## 2023-09-06 DIAGNOSIS — Z8542 Personal history of malignant neoplasm of other parts of uterus: Secondary | ICD-10-CM | POA: Diagnosis not present

## 2023-09-06 DIAGNOSIS — Z90722 Acquired absence of ovaries, bilateral: Secondary | ICD-10-CM

## 2023-09-06 DIAGNOSIS — C541 Malignant neoplasm of endometrium: Secondary | ICD-10-CM

## 2023-09-06 DIAGNOSIS — Z90710 Acquired absence of both cervix and uterus: Secondary | ICD-10-CM

## 2023-09-06 NOTE — Progress Notes (Signed)
Gynecologic Oncology Return Clinic Visit  09/06/23  Reason for Visit: surveillance in the setting of high risk uterine cancer   Treatment History: Oncology History Overview Note  MSI stable, endometrioid grade 2 Neg genetics   Endometrial cancer (HCC)  12/19/2020 Initial Diagnosis   Endometrial cancer (HCC)   12/26/2020 Imaging   1. Lower uterine segment/cervical mass without evidence of metastatic disease. 2. Hepatic steatosis. 3. Aortic atherosclerosis (ICD10-I70.0). Coronary artery calcification.   12/29/2020 Pathology Results   FINAL MICROSCOPIC DIAGNOSIS:   A. SENTINEL LYMPH NODE, RIGHT OBTURATOR, EXCISION:  -  No carcinoma identified in one lymph node (0/1)  -  See comment   B. SENTINEL LYMPH NODE, LEFT OBTURATOR, EXCISION:  -  Micrometastasis involving one lymph node (56mi/1)  -  See comment   C. UTERUS, CERVIX, BILATERAL TUBES AND OVARIES:  Uterus:  -  Endometrioid carcinoma, FIGO grade 2  -  See oncology table and comment below   Cervix:  -  No carcinoma identified   Bilateral Ovaries:  -  No carcinoma identified   Bilateral Fallopian tubes:  -  No carcinoma identified   ONCOLOGY TABLE:   UTERUS, CARCINOMA OR CARCINOSARCOMA: Resection   Procedure: Total hysterectomy and bilateral salpingo-oophorectomy  Histologic Type: Endometrioid carcinoma  Histologic Grade: FIGO grade 2  Myometrial Invasion:       Percentage of Myometrial Invasion: Estimated to be less than 50%  Uterine Serosa Involvement: Not identified  Cervical stromal Involvement: Not identified  Extent of involvement of other tissue/organs: Not identified  Peritoneal/Ascitic Fluid: Not submitted/unknown  Lymphovascular Invasion: Not identified  Regional Lymph Nodes:       Pelvic Lymph Nodes Examined:  2 Sentinel                                0 non-sentinel                                   2 total       Pelvic Lymph Nodes with Metastasis: 0                           Macrometastasis: (>2.0  mm): 0                          Micrometastasis: (>0.2 mm and < 2.0 mm): 1                            Isolated Tumor Cells (<0.2 mm): 0                       Laterality of Lymph Node with Tumor: 0                             Extracapsular Extension: N/A  Distant Metastasis:       Distant Site(s) Involved: N/A  Pathologic Stage Classification (pTNM, AJCC 8th Edition): pT1a, pN29mi  Ancillary Studies: MMR / MSI testing will be ordered  Representative Tumor Block: C1  Comment(s): Cytokeratin AE1/3 was performed on the lymph nodes (parts a and B) and supports the presence of micrometastasis (part B only). Drs. Luisa Hart and [facility] reviewed select parts of the case and agree  with the above diagnosis.   (v4.2.0.1)     12/29/2020 Surgery   Surgeon: Quinn Axe Pre-operative Diagnosis: endometrial cancer grade 3    Operation: Robotic-assisted laparoscopic total hysterectomy with bilateral salpingoophorectomy, SLN biopsy   Surgeon: Quinn Axe  Specimens: uterus, cervix, bilateral tubes and ovaries, right and left obturator SLN           01/22/2021 Cancer Staging   Staging form: Corpus Uteri - Carcinoma and Carcinosarcoma, AJCC 8th Edition - Pathologic stage from 01/22/2021: Stage IIIC1 (pT1a, pN26mi, cM0) - Signed by Artis Delay, MD on 01/22/2021 Stage prefix: Initial diagnosis   01/25/2021 Procedure   Successful placement of a right IJ approach Power Port with ultrasound and fluoroscopic guidance. The catheter is ready for use.   02/07/2021 - 05/19/2021 Chemotherapy   Patient is on Treatment Plan : UTERINE Carboplatin AUC 6 / Paclitaxel q21d     02/11/2021 Genetic Testing   Negative hereditary cancer genetic testing: no pathogenic variants detected in [facility] Common Hereditary Cancers +RNA Panel.  The report date is February 11, 2021.    The Common Hereditary Cancers + RNA Panel offered by [facility] includes sequencing, deletion/duplication, and RNA testing of the following 47 genes:  APC, ATM, AXIN2, BARD1, BMPR1A, BRCA1, BRCA2, BRIP1, CDH1, CDK4*, CDKN2A (p14ARF)*, CDKN2A (p16INK4a)*, CHEK2, CTNNA1, DICER1, EPCAM (Deletion/duplication testing only), GREM1 (promoter region deletion/duplication testing only), KIT, MEN1, MLH1, MSH2, MSH3, MSH6, MUTYH, NBN, NF1, NHTL1, PALB2, PDGFRA*, PMS2, POLD1, POLE, PTEN, RAD50, RAD51C, RAD51D, SDHB, SDHC, SDHD, SMAD4, SMARCA4. STK11, TP53, TSC1, TSC2, and VHL.  The following genes were evaluated for sequence changes only: SDHA and HOXB13 c.251G>A variant only.  RNA analysis is not performed for the * genes.     06/14/2021 Imaging   1. Interval hysterectomy and bilateral salpingo oophorectomy. No findings of residual/recurrent malignancy. 2. Substantial lumbar spondylosis and degenerative disc disease. In addition, a right eccentric intervertebral spur at T8-9 causes prominent central narrowing of the thecal sac. 3. Other imaging findings of potential clinical significance: Aortic Atherosclerosis (ICD10-I70.0). Systemic atherosclerosis. Small type 1 hiatal hernia.     08/30/2022 Imaging   1. Status post hysterectomy without evidence of local recurrence. 2. No evidence of metastatic disease within the abdomen or pelvis. 3. Moderate volume of formed stool in the colon. Correlate for constipation. 4. Moderate-sized hiatal hernia. 5.  Aortic Atherosclerosis (ICD10-I70.0).   08/29/2023 Imaging   CT ABDOMEN PELVIS W CONTRAST Result Date: 09/05/2023 CLINICAL DATA:  Follow-up endometrial carcinoma. Status post chemotherapy. Restaging. * Tracking Code: BO * EXAM: CT ABDOMEN AND PELVIS WITH CONTRAST TECHNIQUE: Multidetector CT imaging of the abdomen and pelvis was performed using the standard protocol following bolus administration of intravenous contrast. RADIATION DOSE REDUCTION: This exam was performed according to the departmental dose-optimization program which includes automated exposure control, adjustment of the mA and/or kV according to patient  size and/or use of iterative reconstruction technique. CONTRAST:  OMNIPAQUE IOHEXOL 300 MG/ML  SOLN COMPARISON:  08/28/2022 FINDINGS: Lower Chest: No acute findings. Hepatobiliary: No suspicious hepatic masses identified. Gallbladder is unremarkable. No evidence of biliary ductal dilatation. Pancreas:  No mass or inflammatory changes. Spleen: Within normal limits in size and appearance. Adrenals/Urinary Tract: No suspicious masses identified. No evidence of ureteral calculi or hydronephrosis. Tiny amount of gas noted in urinary bladder, presumably from recent bladder catheterization/instrumentation. Stomach/Bowel: Moderate size hiatal hernia again seen. No evidence of obstruction, inflammatory process or abnormal fluid collections. Vascular/Lymphatic: No pathologically enlarged lymph nodes. No acute vascular findings. Reproductive: Prior  hysterectomy noted. No mass seen involving the vaginal cuff. Adnexal regions are unremarkable in appearance. Other:  None. Musculoskeletal:  No suspicious bone lesions identified. IMPRESSION: No evidence of recurrent or metastatic disease within the abdomen or pelvis. Stable moderate hiatal hernia. Electronically Signed   By: Danae Orleans M.D.   On: 09/05/2023 10:46   HM MAMMOGRAPHY Negative - [employer]       Interval History: Doing well.  Denies any abdominal or pelvic pain.  Had Botox injection for urinary symptoms yesterday.  Nuys any vaginal bleeding or discharge.  Since stopping oxybutynin, bowel function has normalized.  Past Medical/Surgical History: Past Medical History:  Diagnosis Date   Arthritis    Asthma    Diverticulosis    Dyspnea    with exertion    Endometrial hyperplasia    Esophageal stricture    Family history of breast cancer 01/26/2021   Foot pain    Fundic gland polyps of stomach, benign 07/10/2015   Gastritis    GERD (gastroesophageal reflux disease)    Hemorrhoid    Hiatal hernia    Hip pain    History of kidney stones     cystoscopy   History of surgery on arm    left humerus   Hyperlipidemia    Hypertension    Hypothyroidism    Knee pain    Metabolic syndrome    Obstructive sleep apnea 08/27/2017   pt denies    Oxygen deficiency    pt denies   PONV (postoperative nausea and vomiting)    Spinal headache    few headaches recently    Tendonitis of ankle    Vitamin D deficiency     Past Surgical History:  Procedure Laterality Date   ANAL FISSURE REPAIR     CATARACT EXTRACTION  May 2009   COLONOSCOPY  12/26/07   ESOPHAGEAL DILATION     ESOPHAGOGASTRODUODENOSCOPY  12/26/07, 05/13/08   EYE SURGERY Bilateral    bilateral cataract with lens implants   IR IMAGING GUIDED PORT INSERTION  01/25/2021   KNEE CARTILAGE SURGERY Right Sept 1999   ORIF HUMERUS FRACTURE Left 01/12/2020   Procedure: OPEN REDUCTION INTERNAL FIXATION (ORIF) LEFT HUMERUS FRACTURE;  Surgeon: Dominica Severin, MD;  Location: [facility] OR;  Service: Orthopedics;  Laterality: Left;   ROBOTIC ASSISTED TOTAL HYSTERECTOMY WITH BILATERAL SALPINGO OOPHERECTOMY Bilateral 12/29/2020   Procedure: XI ROBOTIC ASSISTED TOTAL HYSTERECTOMY WITH BILATERAL SALPINGO OOPHORECTOMY;  Surgeon: Adolphus Birchwood, MD;  Location: [facility];  Service: Gynecology;  Laterality: Bilateral;   SENTINEL NODE BIOPSY N/A 12/29/2020   Procedure: SENTINEL NODE BIOPSY;  Surgeon: Adolphus Birchwood, MD;  Location: [facility];  Service: Gynecology;  Laterality: N/A;   TOTAL KNEE ARTHROPLASTY Right 10/07/2017   Procedure: RIGHT TOTAL KNEE ARTHROPLASTY;  Surgeon: Ollen Gross, MD;  Location: [facility];  Service: Orthopedics;  Laterality: Right;  Adductor Block   UMBILICAL HERNIA REPAIR  05/20/06    Family History  Problem Relation Age of Onset   Colon cancer Mother 60   Heart attack Father    Heart disease Sister    Breast cancer Sister 41   Breast cancer Sister        dx 45s   Cancer Maternal Aunt 57       "female cancer", unknown type   Cancer Maternal Grandmother        unknown type; dx 80s    Cancer Maternal Grandfather        unknown type; dx 76s   Cancer Cousin  maternal female cousin; dx 96s; unknown type   Cancer Cousin        maternal female cousin; dx 74s; unknown type   Cancer Cousin        maternal female cousin; dx after 93; unknown type   Esophageal cancer Neg Hx    Stomach cancer Neg Hx    Rectal cancer Neg Hx     Social History   Socioeconomic History   Marital status: Married    Spouse name: Maisie Fus   Number of children: 2   Years of education: 14   Highest education level: Associate degree: academic program  Occupational History   Occupation: Retired  Tobacco Use   Smoking status: Never   Smokeless tobacco: Never  Vaping Use   Vaping status: Never Used  Substance and Sexual Activity   Alcohol use: No   Drug use: No   Sexual activity: Not Currently    Birth control/protection: Post-menopausal  Other Topics Concern   Not on file  Social History Narrative   Married and retired   No EtOH, tobacco or drug use   Social Drivers of Corporate investment banker Strain: Low Risk  (05/23/2023)   Overall Financial Resource Strain (CARDIA)    Difficulty of Paying Living Expenses: Not hard at all  Food Insecurity: No Food Insecurity (05/23/2023)   Hunger Vital Sign    Worried About Running Out of Food in the Last Year: Never true    Ran Out of Food in the Last Year: Never true  Transportation Needs: No Transportation Needs (05/23/2023)   PRAPARE - Administrator, Civil Service (Medical): No    Lack of Transportation (Non-Medical): No  Physical Activity: Inactive (05/23/2023)   Exercise Vital Sign    Days of Exercise per Week: 0 days    Minutes of Exercise per Session: 0 min  Stress: No Stress Concern Present (05/23/2023)   [employer] of Occupational Health - Occupational Stress Questionnaire    Feeling of Stress : Not at all  Social Connections: Socially Integrated (05/23/2023)   Social Connection and Isolation Panel [NHANES]     Frequency of Communication with Friends and Family: More than three times a week    Frequency of Social Gatherings with Friends and Family: More than three times a week    Attends Religious Services: More than 4 times per year    Active Member of [employer] or Organizations: Yes    Attends Engineer, structural: More than 4 times per year    Marital Status: Married    Current Medications:  Current Outpatient Medications:    acetaminophen (TYLENOL) 500 MG tablet, Take 1,000 mg by mouth as needed (pain.)., Disp: , Rfl:    albuterol (VENTOLIN HFA) 108 (90 Base) MCG/ACT inhaler, Inhale 2 puffs into the lungs every 6 (six) hours as needed for wheezing or shortness of breath. (Patient taking differently: Inhale 2 puffs into the lungs as needed for wheezing or shortness of breath. PRN), Disp: 6.7 g, Rfl: 1   benzonatate (TESSALON PERLES) 100 MG capsule, Take 1 capsule (100 mg total) by mouth 3 (three) times daily as needed for cough., Disp: 20 capsule, Rfl: 0   budesonide-formoterol (SYMBICORT) 80-4.5 MCG/ACT inhaler, Inhale 2 puffs into the lungs 2 (two) times daily. (Patient taking differently: Inhale 2 puffs into the lungs 2 (two) times daily as needed.), Disp: 3 each, Rfl: 11   busPIRone (BUSPAR) 7.5 MG tablet, Take 1 tablet (7.5 mg total) by mouth 2 (  two) times daily., Disp: 180 tablet, Rfl: 3   Calcium Carbonate-Vit D-Min (CALCIUM 1200 PO), Take 1 tablet by mouth in the morning., Disp: , Rfl:    Cholecalciferol (VITAMIN D3) 2000 units TABS, Take 2,000 Units by mouth in the morning., Disp: , Rfl:    clobetasol ointment (TEMOVATE) 0.05 %, Apply 1 Application topically 3 (three) times a week. Apply cream to the area where you have had itching 3 times a week at night., Disp: 30 g, Rfl: 0   DULoxetine (CYMBALTA) 30 MG capsule, Take 2 capsules by mouth once daily, Disp: 180 capsule, Rfl: 1   estradiol (ESTRACE) 0.1 MG/GM vaginal cream, Place 0.5 g vaginally 2 (two) times a week. Place 0.5g twice  a week after, Disp: 30 g, Rfl: 3   furosemide (LASIX) 20 MG tablet, TAKE 1 TABLET BY MOUTH DAILY AS  NEEDED FOR FLUID, Disp: 100 tablet, Rfl: 0   levothyroxine (SYNTHROID) 75 MCG tablet, Take 1 tablet (75 mcg total) by mouth daily before breakfast., Disp: 90 tablet, Rfl: 3   lidocaine (XYLOCAINE) 5 % ointment, Apply 1 Application topically as needed. Up to 3 times a day, Disp: 35.44 g, Rfl: 0   losartan-hydrochlorothiazide (HYZAAR) 100-12.5 MG tablet, Take 1 tablet by mouth daily., Disp: 100 tablet, Rfl: 0   Omega-3 Fatty Acids (FISH OIL) 1000 MG CAPS, Take 1,000 mg by mouth in the morning., Disp: , Rfl:    pantoprazole (PROTONIX) 40 MG tablet, Take 1 tablet (40 mg total) by mouth 2 (two) times daily before a meal., Disp: 200 tablet, Rfl: 3   valACYclovir (VALTREX) 1000 MG tablet, TAKE 1 TABLET BY MOUTH TWICE  DAILY FOR 7 DAYS; IF HAVING  ANOTHER FLARE, TAKE 2 TABLETS  TWICE DAILY FOR 1 DAY ONLY PER  FLAREUP, Disp: 100 tablet, Rfl: 0   vitamin B-12 (CYANOCOBALAMIN) 1000 MCG tablet, Take 1,000 mcg by mouth in the morning., Disp: , Rfl:   Current Facility-Administered Medications:    lidocaine HCl (PF) (XYLOCAINE) 2 % injection 8 mL, 8 mL, Infiltration, Once,   Review of Systems: Denies appetite changes, fevers, chills, fatigue, unexplained weight changes. Denies hearing loss, neck lumps or masses, mouth sores, ringing in ears or voice changes. Denies cough or wheezing.  Denies shortness of breath. Denies chest pain or palpitations. Denies leg swelling. Denies abdominal distention, pain, blood in stools, constipation, diarrhea, nausea, vomiting, or early satiety. Denies pain with intercourse, dysuria, frequency, hematuria or incontinence. Denies hot flashes, pelvic pain, vaginal bleeding or vaginal discharge.   Denies joint pain, back pain or muscle pain/cramps. Denies itching, rash, or wounds. Denies dizziness, headaches, numbness or seizures. Denies swollen lymph nodes or glands, denies easy  bruising or bleeding. Denies anxiety, depression, confusion, or decreased concentration.  Physical Exam: BP 135/77 (BP Location: Left Arm, Patient Position: Sitting)   Pulse 70   Temp 98.7 F (37.1 C) (Oral)   Resp 20   Wt 241 lb 12.8 oz (109.7 kg)   SpO2 96%   BMI 48.10 kg/m  General: Alert, oriented, no acute distress. HEENT: Normocephalic, atraumatic, sclera anicteric. Some white patches on her tongue. Chest: Clear to auscultation bilaterally.  No wheezes or rhonchi. Cardiovascular: Regular rate and rhythm, no murmurs. Abdomen: Obese, soft, nontender.  Normoactive bowel sounds.  No masses or hepatosplenomegaly appreciated.  Well-healed incisions. Extremities: Grossly normal range of motion.  Warm, well perfused.  No edema bilaterally. Skin: No rashes or lesions noted. Lymphatics: No cervical, supraclavicular, or inguinal adenopathy. GU: Normal appearing external genitalia  without erythema, excoriation, or lesions.  No loss of architecture, no visible lesions along the right labia on exam today.  Speculum exam reveals moderately atrophic vaginal mucosa, no lesions or masses, no bleeding or discharge noted.  Laxity of the vaginal wall noted.  Bimanual exam reveals cuff and vaginal mucosa smooth, no nodularity.    Laboratory & Radiologic Studies: None new  Assessment & Plan: Stacie Russell is a 73 y.o. 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  Completed adjuvant chemotherapy in 05/2021.  Posttreatment imaging shows no evidence of disease. Imaging 08/29/23 negative for recurrent disease.   Patient is overall doing well and is NED on exam today.   She is seeing Dr. Olena Leatherwood now for her urinary symptoms.   I will plan to see her back in 4 months for surveillance visit.  Discussed signs and symptoms that would be concerning for cancer recurrence and stressed the importance of calling if she develops any of these.    20 minutes of  total time was spent for this patient encounter, including preparation, face-to-face counseling with the patient and coordination of care, and documentation of the encounter.  Eugene Garnet, MD  Division of Gynecologic Oncology  Department of Obstetrics and Gynecology  [facility]

## 2023-09-06 NOTE — Assessment & Plan Note (Addendum)
I have reviewed imaging study with the patient She has no signs of cancer recurrence I will discontinue surveillance imaging Will see her once a year I will get her port removed

## 2023-09-06 NOTE — Patient Instructions (Signed)
It was good to see you today.  I do not see or feel any evidence of cancer recurrence on your exam.  I will see you for follow-up in 4 months.  As always, if you develop any new and concerning symptoms before your next visit, please call to see me sooner.

## 2023-09-06 NOTE — Progress Notes (Signed)
[facility] OFFICE PROGRESS NOTE  Patient Care Team: Raliegh Ip, DO as PCP - General (Family Medicine) Runell Gess, MD as PCP - Cardiology (Cardiology) Bjorn Pippin, MD (Urology) Huel Cote, MD (Obstetrics and Gynecology) Iva Boop, MD (Gastroenterology) Ollen Gross, MD as Consulting Physician (Orthopedic Surgery) Artis Delay, MD as Consulting Physician (Hematology and Oncology)  ASSESSMENT & PLAN:  Endometrial cancer [facility]) I have reviewed imaging study with the patient She has no signs of cancer recurrence I will discontinue surveillance imaging Will see her once a year I will get her port removed  Orders Placed This Encounter  Procedures   IR REMOVAL TUN ACCESS W/ PORT W/O FL MOD SED    Standing Status:   Future    Expected Date:   09/13/2023    Expiration Date:   09/05/2024    Reason for exam::   no need port    Preferred Imaging Location?:   [facility]    All questions were answered. The patient knows to call the clinic with any problems, questions or concerns. The total time spent in the appointment was 30 minutes encounter with patients including review of chart and various tests results, discussions about plan of care and coordination of care plan   Artis Delay, MD 09/06/2023 1:35 PM  INTERVAL HISTORY: Please see below for problem oriented charting. she returns for surveillance follow-up She is doing well Denies abdominal pain or changes in bowel habits We reviewed imaging studies and discussed plan of care  REVIEW OF SYSTEMS:   Constitutional: Denies fevers, chills or abnormal weight loss Eyes: Denies blurriness of vision Ears, nose, mouth, throat, and face: Denies mucositis or sore throat Respiratory: Denies cough, dyspnea or wheezes Cardiovascular: Denies palpitation, chest discomfort or lower extremity swelling Gastrointestinal:  Denies nausea, heartburn or change in bowel habits Skin: Denies abnormal skin  rashes Lymphatics: Denies new lymphadenopathy or easy bruising Neurological:Denies numbness, tingling or new weaknesses Behavioral/Psych: Mood is stable, no new changes  All other systems were reviewed with the patient and are negative.  I have reviewed the past medical history, past surgical history, social history and family history with the patient and they are unchanged from previous note.  ALLERGIES:  is allergic to tramadol.  MEDICATIONS:  Current Outpatient Medications  Medication Sig Dispense Refill   acetaminophen (TYLENOL) 500 MG tablet Take 1,000 mg by mouth as needed (pain.).     albuterol (VENTOLIN HFA) 108 (90 Base) MCG/ACT inhaler Inhale 2 puffs into the lungs every 6 (six) hours as needed for wheezing or shortness of breath. (Patient taking differently: Inhale 2 puffs into the lungs as needed for wheezing or shortness of breath. PRN) 6.7 g 1   benzonatate (TESSALON PERLES) 100 MG capsule Take 1 capsule (100 mg total) by mouth 3 (three) times daily as needed for cough. 20 capsule 0   budesonide-formoterol (SYMBICORT) 80-4.5 MCG/ACT inhaler Inhale 2 puffs into the lungs 2 (two) times daily. (Patient taking differently: Inhale 2 puffs into the lungs 2 (two) times daily as needed.) 3 each 11   busPIRone (BUSPAR) 7.5 MG tablet Take 1 tablet (7.5 mg total) by mouth 2 (two) times daily. 180 tablet 3   Calcium Carbonate-Vit D-Min (CALCIUM 1200 PO) Take 1 tablet by mouth in the morning.     Cholecalciferol (VITAMIN D3) 2000 units TABS Take 2,000 Units by mouth in the morning.     clobetasol ointment (TEMOVATE) 0.05 % Apply 1 Application topically 3 (three) times a  week. Apply cream to the area where you have had itching 3 times a week at night. 30 g 0   DULoxetine (CYMBALTA) 30 MG capsule Take 2 capsules by mouth once daily 180 capsule 1   estradiol (ESTRACE) 0.1 MG/GM vaginal cream Place 0.5 g vaginally 2 (two) times a week. Place 0.5g twice a week after 30 g 3   furosemide (LASIX) 20  MG tablet TAKE 1 TABLET BY MOUTH DAILY AS  NEEDED FOR FLUID 100 tablet 0   levothyroxine (SYNTHROID) 75 MCG tablet Take 1 tablet (75 mcg total) by mouth daily before breakfast. 90 tablet 3   lidocaine (XYLOCAINE) 5 % ointment Apply 1 Application topically as needed. Up to 3 times a day 35.44 g 0   losartan-hydrochlorothiazide (HYZAAR) 100-12.5 MG tablet Take 1 tablet by mouth daily. 100 tablet 0   Omega-3 Fatty Acids (FISH OIL) 1000 MG CAPS Take 1,000 mg by mouth in the morning.     pantoprazole (PROTONIX) 40 MG tablet Take 1 tablet (40 mg total) by mouth 2 (two) times daily before a meal. 200 tablet 3   valACYclovir (VALTREX) 1000 MG tablet TAKE 1 TABLET BY MOUTH TWICE  DAILY FOR 7 DAYS; IF HAVING  ANOTHER FLARE, TAKE 2 TABLETS  TWICE DAILY FOR 1 DAY ONLY PER  FLAREUP 100 tablet 0   vitamin B-12 (CYANOCOBALAMIN) 1000 MCG tablet Take 1,000 mcg by mouth in the morning.     Current Facility-Administered Medications  Medication Dose Route Frequency Provider Last Rate Last Admin   lidocaine HCl (PF) (XYLOCAINE) 2 % injection 8 mL  8 mL Infiltration Once         SUMMARY OF ONCOLOGIC HISTORY: Oncology History Overview Note  MSI stable, endometrioid grade 2 Neg genetics   Endometrial cancer (HCC)  12/19/2020 Initial Diagnosis   Endometrial cancer (HCC)   12/26/2020 Imaging   1. Lower uterine segment/cervical mass without evidence of metastatic disease. 2. Hepatic steatosis. 3. Aortic atherosclerosis (ICD10-I70.0). Coronary artery calcification.   12/29/2020 Pathology Results   FINAL MICROSCOPIC DIAGNOSIS:   A. SENTINEL LYMPH NODE, RIGHT OBTURATOR, EXCISION:  -  No carcinoma identified in one lymph node (0/1)  -  See comment   B. SENTINEL LYMPH NODE, LEFT OBTURATOR, EXCISION:  -  Micrometastasis involving one lymph node (52mi/1)  -  See comment   C. UTERUS, CERVIX, BILATERAL TUBES AND OVARIES:  Uterus:  -  Endometrioid carcinoma, FIGO grade 2  -  See oncology table and comment below    Cervix:  -  No carcinoma identified   Bilateral Ovaries:  -  No carcinoma identified   Bilateral Fallopian tubes:  -  No carcinoma identified   ONCOLOGY TABLE:   UTERUS, CARCINOMA OR CARCINOSARCOMA: Resection   Procedure: Total hysterectomy and bilateral salpingo-oophorectomy  Histologic Type: Endometrioid carcinoma  Histologic Grade: FIGO grade 2  Myometrial Invasion:       Percentage of Myometrial Invasion: Estimated to be less than 50%  Uterine Serosa Involvement: Not identified  Cervical stromal Involvement: Not identified  Extent of involvement of other tissue/organs: Not identified  Peritoneal/Ascitic Fluid: Not submitted/unknown  Lymphovascular Invasion: Not identified  Regional Lymph Nodes:       Pelvic Lymph Nodes Examined:  2 Sentinel                                0 non-sentinel  2 total       Pelvic Lymph Nodes with Metastasis: 0                           Macrometastasis: (>2.0 mm): 0                          Micrometastasis: (>0.2 mm and < 2.0 mm): 1                            Isolated Tumor Cells (<0.2 mm): 0                       Laterality of Lymph Node with Tumor: 0                             Extracapsular Extension: N/A  Distant Metastasis:       Distant Site(s) Involved: N/A  Pathologic Stage Classification (pTNM, AJCC 8th Edition): pT1a, pN65mi  Ancillary Studies: MMR / MSI testing will be ordered  Representative Tumor Block: C1  Comment(s): Cytokeratin AE1/3 was performed on the lymph nodes (parts a and B) and supports the presence of micrometastasis (part B only). Drs. Luisa Hart and [facility] reviewed select parts of the case and agree with the above diagnosis.   (v4.2.0.1)     12/29/2020 Surgery   Surgeon: Quinn Axe Pre-operative Diagnosis: endometrial cancer grade 3    Operation: Robotic-assisted laparoscopic total hysterectomy with bilateral salpingoophorectomy, SLN biopsy   Surgeon: Quinn Axe  Specimens: uterus, cervix, bilateral tubes and ovaries, right and left obturator SLN           01/22/2021 Cancer Staging   Staging form: Corpus Uteri - Carcinoma and Carcinosarcoma, AJCC 8th Edition - Pathologic stage from 01/22/2021: Stage IIIC1 (pT1a, pN31mi, cM0) - Signed by Artis Delay, MD on 01/22/2021 Stage prefix: Initial diagnosis   01/25/2021 Procedure   Successful placement of a right IJ approach Power Port with ultrasound and fluoroscopic guidance. The catheter is ready for use.   02/07/2021 - 05/19/2021 Chemotherapy   Patient is on Treatment Plan : UTERINE Carboplatin AUC 6 / Paclitaxel q21d     02/11/2021 Genetic Testing   Negative hereditary cancer genetic testing: no pathogenic variants detected in [facility] Common Hereditary Cancers +RNA Panel.  The report date is February 11, 2021.    The Common Hereditary Cancers + RNA Panel offered by [facility] includes sequencing, deletion/duplication, and RNA testing of the following 47 genes: APC, ATM, AXIN2, BARD1, BMPR1A, BRCA1, BRCA2, BRIP1, CDH1, CDK4*, CDKN2A (p14ARF)*, CDKN2A (p16INK4a)*, CHEK2, CTNNA1, DICER1, EPCAM (Deletion/duplication testing only), GREM1 (promoter region deletion/duplication testing only), KIT, MEN1, MLH1, MSH2, MSH3, MSH6, MUTYH, NBN, NF1, NHTL1, PALB2, PDGFRA*, PMS2, POLD1, POLE, PTEN, RAD50, RAD51C, RAD51D, SDHB, SDHC, SDHD, SMAD4, SMARCA4. STK11, TP53, TSC1, TSC2, and VHL.  The following genes were evaluated for sequence changes only: SDHA and HOXB13 c.251G>A variant only.  RNA analysis is not performed for the * genes.     06/14/2021 Imaging   1. Interval hysterectomy and bilateral salpingo oophorectomy. No findings of residual/recurrent malignancy. 2. Substantial lumbar spondylosis and degenerative disc disease. In addition, a right eccentric intervertebral spur at T8-9 causes prominent central narrowing of the thecal sac. 3. Other imaging findings of potential clinical significance: Aortic Atherosclerosis  (ICD10-I70.0). Systemic atherosclerosis. Small type 1 hiatal hernia.  08/30/2022 Imaging   1. Status post hysterectomy without evidence of local recurrence. 2. No evidence of metastatic disease within the abdomen or pelvis. 3. Moderate volume of formed stool in the colon. Correlate for constipation. 4. Moderate-sized hiatal hernia. 5.  Aortic Atherosclerosis (ICD10-I70.0).   08/29/2023 Imaging   CT ABDOMEN PELVIS W CONTRAST Result Date: 09/05/2023 CLINICAL DATA:  Follow-up endometrial carcinoma. Status post chemotherapy. Restaging. * Tracking Code: BO * EXAM: CT ABDOMEN AND PELVIS WITH CONTRAST TECHNIQUE: Multidetector CT imaging of the abdomen and pelvis was performed using the standard protocol following bolus administration of intravenous contrast. RADIATION DOSE REDUCTION: This exam was performed according to the departmental dose-optimization program which includes automated exposure control, adjustment of the mA and/or kV according to patient size and/or use of iterative reconstruction technique. CONTRAST:  OMNIPAQUE IOHEXOL 300 MG/ML  SOLN COMPARISON:  08/28/2022 FINDINGS: Lower Chest: No acute findings. Hepatobiliary: No suspicious hepatic masses identified. Gallbladder is unremarkable. No evidence of biliary ductal dilatation. Pancreas:  No mass or inflammatory changes. Spleen: Within normal limits in size and appearance. Adrenals/Urinary Tract: No suspicious masses identified. No evidence of ureteral calculi or hydronephrosis. Tiny amount of gas noted in urinary bladder, presumably from recent bladder catheterization/instrumentation. Stomach/Bowel: Moderate size hiatal hernia again seen. No evidence of obstruction, inflammatory process or abnormal fluid collections. Vascular/Lymphatic: No pathologically enlarged lymph nodes. No acute vascular findings. Reproductive: Prior hysterectomy noted. No mass seen involving the vaginal cuff. Adnexal regions are unremarkable in appearance. Other:   None. Musculoskeletal:  No suspicious bone lesions identified. IMPRESSION: No evidence of recurrent or metastatic disease within the abdomen or pelvis. Stable moderate hiatal hernia. Electronically Signed   By: Danae Orleans M.D.   On: 09/05/2023 10:46   HM MAMMOGRAPHY Negative - [facility]        PHYSICAL EXAMINATION: ECOG PERFORMANCE STATUS: 0 - Asymptomatic  Vitals:   09/06/23 1108  BP: (!) 138/56  Pulse: 72  Resp: 18  SpO2: 97%   Filed Weights   09/06/23 1108  Weight: 243 lb 9.6 oz (110.5 kg)    GENERAL:alert, no distress and comfortable  LABORATORY DATA:  I have reviewed the data as listed    Component Value Date/Time   NA 139 08/29/2023 1113   NA 141 08/31/2020 1149   K 4.1 08/29/2023 1113   CL 105 08/29/2023 1113   CO2 30 08/29/2023 1113   GLUCOSE 93 08/29/2023 1113   BUN 20 08/29/2023 1113   BUN 15 08/31/2020 1149   CREATININE 0.64 08/29/2023 1113   CREATININE 0.77 03/05/2023 1156   CREATININE 0.60 11/03/2012 1228   CALCIUM 9.4 08/29/2023 1113   PROT 7.0 08/29/2023 1113   PROT 7.3 08/31/2020 1149   ALBUMIN 4.0 08/29/2023 1113   ALBUMIN 4.7 08/31/2020 1149   AST 15 08/29/2023 1113   AST 13 (L) 03/05/2023 1156   ALT 19 08/29/2023 1113   ALT 15 03/05/2023 1156   ALKPHOS 95 08/29/2023 1113   BILITOT 0.4 08/29/2023 1113   BILITOT 0.6 03/05/2023 1156   GFRNONAA >60 08/29/2023 1113   GFRNONAA >60 03/05/2023 1156   GFRAA 91 08/31/2020 1149    No results found for: "SPEP", "UPEP"  Lab Results  Component Value Date   WBC 7.2 08/29/2023   NEUTROABS 4.3 08/29/2023   HGB 13.7 08/29/2023   HCT 40.8 08/29/2023   MCV 92.7 08/29/2023   PLT 218 08/29/2023      Chemistry      Component Value Date/Time   NA 139 08/29/2023  1113   NA 141 08/31/2020 1149   K 4.1 08/29/2023 1113   CL 105 08/29/2023 1113   CO2 30 08/29/2023 1113   BUN 20 08/29/2023 1113   BUN 15 08/31/2020 1149   CREATININE 0.64 08/29/2023 1113   CREATININE 0.77 03/05/2023 1156   CREATININE  0.60 11/03/2012 1228   GLU 81 07/24/2012 0000      Component Value Date/Time   CALCIUM 9.4 08/29/2023 1113   ALKPHOS 95 08/29/2023 1113   AST 15 08/29/2023 1113   AST 13 (L) 03/05/2023 1156   ALT 19 08/29/2023 1113   ALT 15 03/05/2023 1156   BILITOT 0.4 08/29/2023 1113   BILITOT 0.6 03/05/2023 1156       RADIOGRAPHIC STUDIES: I have reviewed multiple imaging studies with the patient I have personally reviewed the radiological images as listed and agreed with the findings in the report. CT ABDOMEN PELVIS W CONTRAST Result Date: 09/05/2023 CLINICAL DATA:  Follow-up endometrial carcinoma. Status post chemotherapy. Restaging. * Tracking Code: BO * EXAM: CT ABDOMEN AND PELVIS WITH CONTRAST TECHNIQUE: Multidetector CT imaging of the abdomen and pelvis was performed using the standard protocol following bolus administration of intravenous contrast. RADIATION DOSE REDUCTION: This exam was performed according to the departmental dose-optimization program which includes automated exposure control, adjustment of the mA and/or kV according to patient size and/or use of iterative reconstruction technique. CONTRAST:  OMNIPAQUE IOHEXOL 300 MG/ML  SOLN COMPARISON:  08/28/2022 FINDINGS: Lower Chest: No acute findings. Hepatobiliary: No suspicious hepatic masses identified. Gallbladder is unremarkable. No evidence of biliary ductal dilatation. Pancreas:  No mass or inflammatory changes. Spleen: Within normal limits in size and appearance. Adrenals/Urinary Tract: No suspicious masses identified. No evidence of ureteral calculi or hydronephrosis. Tiny amount of gas noted in urinary bladder, presumably from recent bladder catheterization/instrumentation. Stomach/Bowel: Moderate size hiatal hernia again seen. No evidence of obstruction, inflammatory process or abnormal fluid collections. Vascular/Lymphatic: No pathologically enlarged lymph nodes. No acute vascular findings. Reproductive: Prior hysterectomy noted.  No mass seen involving the vaginal cuff. Adnexal regions are unremarkable in appearance. Other:  None. Musculoskeletal:  No suspicious bone lesions identified. IMPRESSION: No evidence of recurrent or metastatic disease within the abdomen or pelvis. Stable moderate hiatal hernia. Electronically Signed   By: Danae Orleans M.D.   On: 09/05/2023 10:46   HM MAMMOGRAPHY Negative - [facility]

## 2023-09-20 ENCOUNTER — Ambulatory Visit: Payer: HMO | Admitting: Gynecologic Oncology

## 2023-10-02 ENCOUNTER — Inpatient Hospital Stay (HOSPITAL_COMMUNITY)
Admission: RE | Admit: 2023-10-02 | Discharge: 2023-10-02 | Disposition: A | Discharge status: Home / Self Care | Payer: HMO | Source: Ambulatory Visit | Attending: Hematology and Oncology

## 2023-10-04 ENCOUNTER — Other Ambulatory Visit (HOSPITAL_COMMUNITY)
Admission: RE | Admit: 2023-10-04 | Discharge: 2023-10-04 | Disposition: A | Discharge status: Home / Self Care | Payer: HMO | Source: Ambulatory Visit | Attending: Obstetrics | Admitting: Obstetrics

## 2023-10-04 ENCOUNTER — Encounter: Payer: Self-pay | Admitting: Obstetrics

## 2023-10-04 ENCOUNTER — Ambulatory Visit: Payer: HMO | Admitting: Obstetrics

## 2023-10-04 VITALS — BP 129/84 | HR 82

## 2023-10-04 DIAGNOSIS — N952 Postmenopausal atrophic vaginitis: Secondary | ICD-10-CM

## 2023-10-04 DIAGNOSIS — R300 Dysuria: Secondary | ICD-10-CM

## 2023-10-04 DIAGNOSIS — K5909 Other constipation: Secondary | ICD-10-CM | POA: Diagnosis not present

## 2023-10-04 DIAGNOSIS — N3946 Mixed incontinence: Secondary | ICD-10-CM

## 2023-10-04 LAB — POCT URINALYSIS DIPSTICK
Bilirubin, UA: NEGATIVE
Blood, UA: NEGATIVE
Glucose, UA: NEGATIVE
Ketones, UA: NEGATIVE
Nitrite, UA: NEGATIVE
Protein, UA: NEGATIVE
Spec Grav, UA: 1.02 (ref 1.010–1.025)
Urobilinogen, UA: 0.2 U/dL
pH, UA: 6 (ref 5.0–8.0)

## 2023-10-04 MED ORDER — NITROFURANTOIN MONOHYD MACRO 100 MG PO CAPS: 100.0000 mg | ORAL_CAPSULE | Freq: Two times a day (BID) | ORAL | 0 refills | Status: DC

## 2023-10-04 MED ORDER — ESTRADIOL 0.1 MG/GM VA CREA: TOPICAL_CREAM | VAGINAL | 3 refills | Status: AC

## 2023-10-04 MED ORDER — TROSPIUM CHLORIDE ER 60 MG PO CP24: 1.0000 | ORAL_CAPSULE | Freq: Every day | ORAL | 2 refills | Status: DC

## 2023-10-04 NOTE — Patient Instructions (Addendum)
For vaginal atrophy (thinning of the vaginal tissue that can cause dryness and burning) and UTI prevention we discussed estrogen replacement in the form of vaginal cream.   Start macorbid 1 tab by mouth twice a day for 10 days. Please call if you continue to experience persistent or worsening urinary symptoms such as fever > 100.4, nausea/vomiting, one sided back pain or blood in your urine.   Start vaginal estrogen therapy nightly for two weeks then 2 times weekly at night. This can be placed with your finger or an applicator inside the vagina and around the urethra.  Please let us know if the prescription is too expensive and we can look for alternative options.   Is vaginal estrogen therapy safe for me? Vaginal estrogen preparations act on the vaginal skin, and only a very tiny amount is absorbed into the bloodstream (0.01%).  They work in a similar way to hand or face cream.  There is minimal absorption and they are therefore perfectly safe. If you have had breast cancer and have persistent troublesome symptoms which aren't settling with vaginal moisturisers and lubricants, local estrogen treatment may be a possibility, but consultation with your oncologist should take place first.   We discussed the symptoms of overactive bladder (OAB), which include urinary urgency, urinary frequency, night-time urination, with or without urge incontinence.  We discussed management including behavioral therapy (decreasing bladder irritants by following a bladder diet, urge suppression strategies, timed voids, bladder retraining), physical therapy, medication; and for refractory cases posterior tibial nerve stimulation, sacral neuromodulation, and intravesical botulinum toxin injection.   For anticholinergic medications, we discussed the potential side effects of anticholinergics including dry eyes, dry mouth, constipation, rare risks of cognitive impairment and urinary retention. You were given a prescription to  [facility] for Trospium.  It can take a month to start working so give it time, but if you have bothersome side effects call sooner and we can try a different medication.  Call us if you have trouble filling the prescription or if it's not covered by your insurance.  Follow-up in 4 weeks to see how your symptoms are responding to treatment.   Please go to [facility] to signup for an account at [email or web address] - additional information will be sent to your email address provided - you will receive the medication in the mail  We will contact you with your vaginal testing results.

## 2023-10-04 NOTE — Assessment & Plan Note (Signed)
-   For symptomatic vaginal atrophy options include lubrication with a water-based lubricant, personal hygiene measures and barrier protection against wetness, and estrogen replacement in the form of vaginal cream, vaginal tablets, or a time-released vaginal ring.   - resume low dose vaginal estrogen with Rx provided. Patient to call if she does not receive Rx from pharmacy and will send to [facility]

## 2023-10-04 NOTE — Progress Notes (Signed)
 Marland Kitchen

## 2023-10-04 NOTE — Assessment & Plan Note (Signed)
-   chronic constipation previously exacerbated by anti-cholinergics, Rx Trospium 60XL to assess urinary symptoms. Stop if it worsens constipation - encouraged to start fiber supplementation with instructions reviewed due to prior changes in loose stool attributed to dietary changes, history of hemorrhoids and diverticulosis - reviewed association with urinary symptoms, previously discontinued oxybutynin - For constipation, we reviewed the importance of a better bowel regimen.  We also discussed the importance of avoiding chronic straining, as it can exacerbate her pelvic floor symptoms; we discussed treating constipation and straining prior to surgery, as postoperative straining can lead to damage to the repair and recurrence of symptoms. We discussed initiating therapy with increasing fluid intake, fiber supplementation, stool softeners, and laxatives such as miralax.

## 2023-10-04 NOTE — Assessment & Plan Note (Signed)
-   intermittent symptoms after urethral bulking and botox injection, improved in the past few weeks - resume vaginal estrogen to r/o atrophy, pt to call if pharmacy does not receive Rx and can send to [facility] - prior treatment of BV with resolution of symptoms, pending Nuswab - pending catheterized UA and culture, POCT UA + leuk. PVR WNL - symptoms started 06/13/23. POCT UA + leuk/heme, culture with multiple species and micro 0-5 RBC/hpf - Rx pyridium, continue PRN use for comfort - pt to contact office if symptoms worsens for treatment - prefer catheterized sample to minimize contamination - dilute urine with warm water during void - topical barrier ointment to reduce burning reproducible with vulvar palpation

## 2023-10-04 NOTE — Progress Notes (Signed)
[facility] Return Visit  SUBJECTIVE  History of Present Illness: Stacie Russell is a 73 y.o. female seen in follow-up for urethral bulking and bladder botox injection 09/05/23.   Reports URI after procedure Reports decrease force of stream and worsening leakage. Leaks with urgency 10-12/day up from 5-6x/day  Leaks 3-4x/week down from 3-4/days  Pad use:  increased to 8 pad/day up from 5-6  pads/day started 10 days after Day time voids 12-15 up from 7-10.  Nocturia: 1x/night down from 2-4 times per night to void with unclear history of OSA and did not start Bipap use   Reports burning with urination a few weeks ago, denies hematuria.  Denies back pain or fever. Denies new medications, surgery, bowel changes or weight changes  Prior resolution of burning with urination after flagyl. Negative urine culture 07/17/23 and UA + trace leuk Nuswab + BV s/p flagyl 07/01/23 Stopped vaginal estrogen since botox injection, stopped oxybutynin since 05/2023. Denies relief of vaginal irritation or need for barrier ointment use Prior use of pyridium with some relief Tried Kegels, pelvic floor PT with some relief however discontinued due to cost and left sided PFM tension on exam, PTNS (no relief), and Toviaz (cost prohibitive), Ditropan with relief, mirabegron medications with some relief, and gemtesa with no relief. Denies using Trospium due to cost.  Reports loose stool attributed to diet around the holidays, denies FI. Denies fiber supplementation. However recent bowel movement improved since stopping oxybutynin.   Past Medical History: Patient  has a past medical history of Arthritis, Asthma, Diverticulosis, Dyspnea, Endometrial hyperplasia, Esophageal stricture, Family history of breast cancer (01/26/2021), Foot pain, Fundic gland polyps of stomach, benign (07/10/2015), Gastritis, GERD (gastroesophageal reflux disease), Hemorrhoid, Hiatal hernia, Hip pain, History of kidney stones, History  of surgery on arm, Hyperlipidemia, Hypertension, Hypothyroidism, Knee pain, Metabolic syndrome, Obstructive sleep apnea (08/27/2017), Oxygen deficiency, PONV (postoperative nausea and vomiting), Spinal headache, Tendonitis of ankle, and Vitamin D deficiency.   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   Medications: She has a current medication list which includes the following prescription(s): acetaminophen, albuterol, benzonatate, budesonide-formoterol, buspirone, calcium carbonate-vit d-min, vitamin d3, clobetasol ointment, duloxetine, furosemide, levothyroxine, lidocaine, losartan-hydrochlorothiazide, nitrofurantoin (macrocrystal-monohydrate), fish oil, pantoprazole, trospium chloride, valacyclovir, cyanocobalamin, and [START ON 10/07/2023] estradiol, and the following Facility-Administered Medications: lidocaine hcl (pf).   Allergies: Patient is allergic to tramadol.   Social History: Patient  reports that she has never smoked. She has never used smokeless tobacco. She reports that she does not drink alcohol and does not use drugs.     OBJECTIVE     Physical Exam: Vitals:   10/04/23 1358  BP: 129/84  Pulse: 82   Physical Exam Constitutional:      General: She is not in acute distress.    Appearance: Normal appearance.  Genitourinary:     Urethral meatus normal.     No lesions in the vagina.     Right Labia: No rash, tenderness, lesions, skin  changes or Bartholin's cyst.    Left Labia: No tenderness, lesions, skin changes, Bartholin's cyst or rash.    No vaginal discharge, erythema,  tenderness, bleeding or ulceration.     Urethral meatus caruncle not present.    No urethral prolapse, tenderness, mass, hypermobility, discharge or stress urinary incontinence with cough stress test present.  Cardiovascular:     Rate and Rhythm: Normal rate.  Pulmonary:     Effort: Pulmonary effort is normal. No respiratory distress.  Abdominal:     General: There is no distension.     Palpations: Abdomen is soft. There is no mass.     Tenderness: There is no abdominal tenderness. There is no right CVA tenderness or left CVA tenderness.     Hernia: No hernia is present.  Neurological:     Mental Status: She is alert.  Vitals reviewed. Exam conducted with a chaperone present.    Patient attempted to perform CIC in seated and squatting position over the commode. Difficulty due to inability to visualize and retract labia.    Post Void Residual - 10/04/23 1430       Post Void Residual   Post Void Residual 125 mL           Straight Catheterization Procedure for PVR: After verbal consent was obtained from the patient for catheterization to assess bladder emptying and residual volume the urethra and surrounding tissues were prepped with betadine and an in and out catheterization was performed.  PVR was 50mL.  Urine appeared clear yellow. The patient tolerated the procedure well.      No data to display             ASSESSMENT AND PLAN    Stacie Russell is a 73 y.o. with:  1. Mixed stress and urge urinary incontinence   2. Vaginal atrophy   3. Other constipation   4. Dysuria     Mixed stress and urge urinary incontinence Assessment & Plan: - s/p urethral bulking and bladder botox injection 09/05/23 with reduction of SUI but worsening UUI around 2 weeks after procedure - pending catheterized UA to r/o UTI, Rx macrobid for presumed  UTI - PVR 50mL, repeat CIC teaching however patient unable to perform. Encouraged pt to have husband present to follow-up for teaching if symptoms worsens - pt to request ROI from Dr. 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 Gemtesa ($100) with no relief. Trospium cost prohibitive, Rx sent to [facility] and instructions for pt to signup provided - We discussed the symptoms of overactive bladder (OAB), which include urinary urgency, urinary frequency, nocturia, with or without urge incontinence.  While we do not know the exact etiology of OAB, several treatment options exist. We discussed management including behavioral therapy (decreasing bladder irritants, urge suppression strategies, timed voids, bladder retraining), physical therapy, medication; for refractory cases posterior tibial nerve stimulation, sacral neuromodulation, and intravesical botulinum toxin injection. May consider SNM if refractory symptoms or repeat botox at 200U - UDS 07/17/23 with increased sensation, decreased capacity at , SUI at and DO. Pressure flow with normal PVR, normal detrusor activity and synergic EMG.  - For refractory OAB we reviewed the procedure for intravesical Botox injection with cystoscopy in the office and reviewed the risks, benefits and alternatives of treatment including but not limited to infection, need for self-catheterization and need for repeat therapy.  We discussed that there is a 5-15% chance of needing to catheterize with Botox and that this usually resolves in a few months; however can persist for longer periods of time.  Typically Botox injections would need  to be repeated every 3-12 months since this is not a permanent therapy.   - For treatment of stress urinary incontinence,  non-surgical options include expectant management, weight loss, physical therapy, as well as a pessary.  Surgical options  include a midurethral sling, Burch urethropexy, and transurethral injection of a bulking agent. - reviewed risks and benefits of botox injection, SNM for OAB, periurethral bulking and midurethral sling for SUI.   Orders: -     Nitrofurantoin Monohyd Macro; Take 1 capsule (100 mg total) by mouth 2 (two) times daily for 5 days.  Dispense: 10 capsule; Refill: 0 -     Trospium Chloride ER; Take 1 capsule (60 mg total) by mouth daily.  Dispense: 30 capsule; Refill: 2 -     POCT urinalysis dipstick -     Urine Culture; Future  Vaginal atrophy Assessment & Plan: - For symptomatic vaginal atrophy options include lubrication with a water-based lubricant, personal hygiene measures and barrier protection against wetness, and estrogen replacement in the form of vaginal cream, vaginal tablets, or a time-released vaginal ring.   - resume low dose vaginal estrogen with Rx provided. Patient to call if she does not receive Rx from pharmacy and will send to [facility]  Orders: -     Estradiol; Place 0.5-1g nightly for 2 weeks, then twice a week after  Dispense: 30 g; Refill: 3 -     Cervicovaginal ancillary only  Other constipation Assessment & Plan: - chronic constipation previously exacerbated by anti-cholinergics, Rx Trospium 60XL to assess urinary symptoms. Stop if it worsens constipation - encouraged to start fiber supplementation with instructions reviewed due to prior changes in loose stool attributed to dietary changes, history of hemorrhoids and diverticulosis - reviewed association with urinary symptoms, previously discontinued oxybutynin - For constipation, we reviewed the importance of a better bowel regimen.  We also discussed the importance of avoiding chronic straining, as it can exacerbate her pelvic floor symptoms; we discussed treating constipation and straining prior to surgery, as postoperative straining can lead to damage to the repair and recurrence of symptoms. We discussed initiating  therapy with increasing fluid intake, fiber supplementation, stool softeners, and laxatives such as miralax.    Dysuria Assessment & Plan: - intermittent symptoms after urethral bulking and botox injection, improved in the past few weeks - resume vaginal estrogen to r/o atrophy, pt to call if pharmacy does not receive Rx and can send to [facility] - prior treatment of BV with resolution of symptoms, pending Nuswab - pending catheterized UA and culture, POCT UA + leuk. PVR WNL - symptoms started 06/13/23. POCT UA + leuk/heme, culture with multiple species and micro 0-5 RBC/hpf - Rx pyridium, continue PRN use for comfort - pt to contact office if symptoms worsens for treatment - prefer catheterized sample to minimize contamination - dilute urine with warm water during void - topical barrier ointment to reduce burning reproducible with vulvar palpation    Orders: -     Cervicovaginal ancillary only -     POCT urinalysis dipstick -     Urine Culture; Future  Time spent: I spent 47 minutes dedicated to the care of this patient on the date of this encounter to include pre-visit review of records, face-to-face time with the patient discussing mixed urinary incontinence, dysuria, vaginal atrophy, constipation and post visit documentation and ordering medication/ testing.   Loleta Chance, MD

## 2023-10-04 NOTE — Assessment & Plan Note (Signed)
-   s/p urethral bulking and bladder botox injection 09/05/23 with reduction of SUI but worsening UUI around 2 weeks after procedure - pending catheterized UA to r/o UTI, Rx macrobid for presumed UTI - PVR 50mL, repeat CIC teaching however patient unable to perform. Encouraged pt to have husband present to follow-up for teaching if symptoms worsens - pt to request ROI from Dr. 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 Gemtesa ($100) with no relief. Trospium cost prohibitive, Rx sent to [facility] and instructions for pt to signup provided - We discussed the symptoms of overactive bladder (OAB), which include urinary urgency, urinary frequency, nocturia, with or without urge incontinence.  While we do not know the exact etiology of OAB, several treatment options exist. We discussed management including behavioral therapy (decreasing bladder irritants, urge suppression strategies, timed voids, bladder retraining), physical therapy, medication; for refractory cases posterior tibial nerve stimulation, sacral neuromodulation, and intravesical botulinum toxin injection. May consider SNM if refractory symptoms or repeat botox at 200U - UDS 07/17/23 with increased sensation, decreased capacity at , SUI at and DO. Pressure flow with normal PVR, normal detrusor activity and synergic EMG.  - For refractory OAB we reviewed the procedure for intravesical Botox injection with cystoscopy in the office and reviewed the risks, benefits and alternatives of treatment including but not limited to infection, need for self-catheterization and need for repeat therapy.  We discussed that there is a 5-15% chance of needing to catheterize with Botox and that this usually resolves in a few months; however can persist for longer periods of time.  Typically Botox injections would need to be repeated every 3-12 months since this is not a  permanent therapy.   - For treatment of stress urinary incontinence,  non-surgical options include expectant management, weight loss, physical therapy, as well as a pessary.  Surgical options include a midurethral sling, Burch urethropexy, and transurethral injection of a bulking agent. - reviewed risks and benefits of botox injection, SNM for OAB, periurethral bulking and midurethral sling for SUI.

## 2023-10-07 LAB — CERVICOVAGINAL ANCILLARY ONLY
Bacterial Vaginitis (gardnerella): POSITIVE — AB
Candida Glabrata: NEGATIVE
Candida Vaginitis: NEGATIVE
Comment: NEGATIVE
Comment: NEGATIVE
Comment: NEGATIVE

## 2023-10-07 LAB — URINE CULTURE: Culture: 50000 — AB

## 2023-10-07 MED ORDER — SULFAMETHOXAZOLE-TRIMETHOPRIM 800-160 MG PO TABS: 1.0000 | ORAL_TABLET | Freq: Two times a day (BID) | ORAL | 0 refills | Status: AC

## 2023-10-07 MED ORDER — METRONIDAZOLE 500 MG PO TABS
500.0000 mg | ORAL_TABLET | Freq: Two times a day (BID) | ORAL | 0 refills | Status: AC
Start: 2023-10-07 — End: 2023-10-14

## 2023-10-07 NOTE — Addendum Note (Signed)
 Addended byWyatt Haste T on: 10/07/2023 09:30 PM   Modules accepted: Orders

## 2023-10-07 NOTE — Addendum Note (Signed)
 Addended byWyatt Haste T on: 10/07/2023 09:46 AM   Modules accepted: Orders

## 2023-10-08 ENCOUNTER — Ambulatory Visit (HOSPITAL_COMMUNITY)
Admission: RE | Admit: 2023-10-08 | Discharge: 2023-10-08 | Disposition: A | Discharge status: Home / Self Care | Payer: HMO | Source: Ambulatory Visit | Attending: Hematology and Oncology | Admitting: Hematology and Oncology

## 2023-10-08 DIAGNOSIS — Z452 Encounter for adjustment and management of vascular access device: Secondary | ICD-10-CM | POA: Diagnosis not present

## 2023-10-08 DIAGNOSIS — C541 Malignant neoplasm of endometrium: Secondary | ICD-10-CM | POA: Diagnosis not present

## 2023-10-08 HISTORY — PX: IR REMOVAL TUN ACCESS W/ PORT W/O FL MOD SED: IMG2290

## 2023-10-08 MED ORDER — LIDOCAINE-EPINEPHRINE 1 %-1:100000 IJ SOLN
INTRAMUSCULAR | Status: AC
  Filled 2023-10-08: qty 1

## 2023-10-08 MED ORDER — LIDOCAINE-EPINEPHRINE 1 %-1:100000 IJ SOLN
20.0000 mL | Freq: Once | INTRAMUSCULAR | Status: AC
  Administered 2023-10-08: 10 mL via INTRADERMAL

## 2023-10-25 ENCOUNTER — Encounter: Payer: Self-pay | Admitting: Family Medicine

## 2023-10-25 ENCOUNTER — Ambulatory Visit: Payer: Self-pay | Admitting: Family Medicine

## 2023-10-25 ENCOUNTER — Ambulatory Visit: Admitting: Family Medicine

## 2023-10-25 VITALS — BP 159/83 | HR 99 | Temp 97.5°F | Ht 59.25 in | Wt 249.6 lb

## 2023-10-25 DIAGNOSIS — J40 Bronchitis, not specified as acute or chronic: Secondary | ICD-10-CM | POA: Diagnosis not present

## 2023-10-25 DIAGNOSIS — R062 Wheezing: Secondary | ICD-10-CM

## 2023-10-25 DIAGNOSIS — R051 Acute cough: Secondary | ICD-10-CM

## 2023-10-25 LAB — VERITOR FLU A/B WAIVED
Influenza A: NEGATIVE
Influenza B: NEGATIVE

## 2023-10-25 MED ORDER — PREDNISONE 20 MG PO TABS: 40.0000 mg | ORAL_TABLET | Freq: Every day | ORAL | 0 refills | Status: AC

## 2023-10-25 MED ORDER — AZITHROMYCIN 250 MG PO TABS: ORAL_TABLET | ORAL | 0 refills | Status: DC

## 2023-10-25 MED ORDER — BENZONATATE 100 MG PO CAPS: 100.0000 mg | ORAL_CAPSULE | Freq: Three times a day (TID) | ORAL | 0 refills | Status: DC | PRN

## 2023-10-25 NOTE — Progress Notes (Signed)
 Subjective:  Patient ID: Stacie Russell, female    DOB: 1950/11/10, 73 y.o.   MRN: [id number]  Patient Care Team: Raliegh Ip, DO as PCP - General (Family Medicine) Runell Gess, MD as PCP - Cardiology (Cardiology) Bjorn Pippin, MD (Urology) Huel Cote, MD (Obstetrics and Gynecology) Iva Boop, MD (Gastroenterology) Ollen Gross, MD as Consulting Physician (Orthopedic Surgery) Artis Delay, MD as Consulting Physician (Hematology and Oncology)   Chief Complaint:  Cough, Wheezing, and Chills (X 3 days )   HPI: Stacie Russell is a 73 y.o. female presenting on 10/25/2023 for Cough, Wheezing, and Chills (X 3 days )   Discussed the use of AI scribe software for clinical note transcription with the patient, who gave verbal consent to proceed.  History of Present Illness   Stacie Russell "Pam" is a 73 year old female with asthma who presents with wheezing, cough, and chest pain.  She has been experiencing significant wheezing, coughing, and chest pain since Tuesday, accompanied by chills and fever. These symptoms are reminiscent of previous asthma exacerbations, although she has not had an asthma attack in several years.  She has been using her albuterol inhaler frequently, sometimes every couple of hours, due to severe wheezing. She also uses Symbicort twice daily. Despite these measures, her symptoms are not improving as they initially did.  She reports fluid behind her ears, which she associates with her respiratory symptoms. She has not had recent contact with sick individuals.  She mentions a friend with similar symptoms who found relief with hycodan cough medicine.       Relevant past medical, surgical, family, and social history reviewed and updated as indicated.  Allergies and medications reviewed and updated. Data reviewed: Chart in Epic.   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/2015   Gastritis    GERD (gastroesophageal reflux disease)    Hemorrhoid    Hiatal hernia    Hip pain    History of kidney stones    cystoscopy   History of surgery on arm    left humerus   Hyperlipidemia    Hypertension    Hypothyroidism    Knee pain    Metabolic syndrome    Obstructive sleep apnea 08/27/2017   pt denies    Oxygen deficiency    pt denies   PONV (postoperative nausea and vomiting)    Spinal headache    few headaches recently    Tendonitis of ankle    Vitamin D deficiency     Past Surgical History:  Procedure Laterality Date   ANAL FISSURE REPAIR     CATARACT EXTRACTION  May 2009   COLONOSCOPY  12/26/07   ESOPHAGEAL DILATION     ESOPHAGOGASTRODUODENOSCOPY  12/26/07, 05/13/08   EYE SURGERY Bilateral    bilateral cataract with lens implants   IR IMAGING GUIDED PORT INSERTION  01/25/2021   IR REMOVAL TUN ACCESS W/ PORT W/O FL MOD SED  10/08/2023   KNEE CARTILAGE SURGERY Right Sept 1999   ORIF HUMERUS FRACTURE Left 01/12/2020   Procedure: OPEN REDUCTION INTERNAL FIXATION (ORIF) LEFT HUMERUS FRACTURE;  Surgeon: Dominica Severin, MD;  Location: [facility];  Service:  Orthopedics;  Laterality: Left;   ROBOTIC ASSISTED TOTAL HYSTERECTOMY WITH BILATERAL SALPINGO OOPHERECTOMY Bilateral 12/29/2020   Procedure: XI ROBOTIC ASSISTED TOTAL HYSTERECTOMY WITH BILATERAL SALPINGO OOPHORECTOMY;  Surgeon: Adolphus Birchwood, MD;  Location: [facility];  Service: Gynecology;  Laterality: Bilateral;   SENTINEL NODE BIOPSY N/A 12/29/2020   Procedure: SENTINEL NODE BIOPSY;  Surgeon: Adolphus Birchwood, MD;  Location: [facility];  Service: Gynecology;  Laterality: N/A;   TOTAL KNEE ARTHROPLASTY Right 10/07/2017   Procedure: RIGHT TOTAL KNEE ARTHROPLASTY;  Surgeon: Ollen Gross, MD;  Location: [facility];  Service: Orthopedics;  Laterality: Right;   Adductor Block   UMBILICAL HERNIA REPAIR  05/20/06    Social History   Socioeconomic History   Marital status: Married    Spouse name: Maisie Fus   Number of children: 2   Years of education: 14   Highest education level: Associate degree: academic program  Occupational History   Occupation: Retired  Tobacco Use   Smoking status: Never   Smokeless tobacco: Never  Vaping Use   Vaping status: Never Used  Substance and Sexual Activity   Alcohol use: No   Drug use: No   Sexual activity: Not Currently    Birth control/protection: Post-menopausal  Other Topics Concern   Not on file  Social History Narrative   Married and retired   No EtOH, tobacco or drug use   Social Drivers of Corporate investment banker Strain: Low Risk  (05/23/2023)   Overall Financial Resource Strain (CARDIA)    Difficulty of Paying Living Expenses: Not hard at all  Food Insecurity: No Food Insecurity (05/23/2023)   Hunger Vital Sign    Worried About Running Out of Food in the Last Year: Never true    Ran Out of Food in the Last Year: Never true  Transportation Needs: No Transportation Needs (05/23/2023)   PRAPARE - Administrator, Civil Service (Medical): No    Lack of Transportation (Non-Medical): No  Physical Activity: Inactive (05/23/2023)   Exercise Vital Sign    Days of Exercise per Week: 0 days    Minutes of Exercise per Session: 0 min  Stress: No Stress Concern Present (05/23/2023)   [employer] of Occupational Health - Occupational Stress Questionnaire    Feeling of Stress : Not at all  Social Connections: Socially Integrated (05/23/2023)   Social Connection and Isolation Panel [NHANES]    Frequency of Communication with Friends and Family: More than three times a week    Frequency of Social Gatherings with Friends and Family: More than three times a week    Attends Religious Services: More than 4 times per year    Active Member of [employer] or Organizations: Yes    Attends  Engineer, structural: More than 4 times per year    Marital Status: Married  Catering manager Violence: Not At Risk (05/23/2023)   Humiliation, Afraid, Rape, and Kick questionnaire    Fear of Current or Ex-Partner: No    Emotionally Abused: No    Physically Abused: No    Sexually Abused: No    Outpatient Encounter Medications as of 10/25/2023  Medication Sig   acetaminophen (TYLENOL) 500 MG tablet Take 1,000 mg by mouth as needed (pain.).   albuterol (VENTOLIN HFA) 108 (90 Base) MCG/ACT inhaler Inhale 2 puffs into the lungs every 6 (six) hours as needed for wheezing or shortness of breath. (Patient taking differently: Inhale 2 puffs into the lungs as needed for wheezing or shortness of breath. PRN)  azithromycin (ZITHROMAX Z-PAK) 250 MG tablet As directed   benzonatate (TESSALON PERLES) 100 MG capsule Take 1 capsule (100 mg total) by mouth 3 (three) times daily as needed for cough.   budesonide-formoterol (SYMBICORT) 80-4.5 MCG/ACT inhaler Inhale 2 puffs into the lungs 2 (two) times daily. (Patient taking differently: Inhale 2 puffs into the lungs 2 (two) times daily as needed.)   busPIRone (BUSPAR) 7.5 MG tablet Take 1 tablet (7.5 mg total) by mouth 2 (two) times daily.   Calcium Carbonate-Vit D-Min (CALCIUM 1200 PO) Take 1 tablet by mouth in the morning.   Cholecalciferol (VITAMIN D3) 2000 units TABS Take 2,000 Units by mouth in the morning.   clobetasol ointment (TEMOVATE) 0.05 % Apply 1 Application topically 3 (three) times a week. Apply cream to the area where you have had itching 3 times a week at night.   DULoxetine (CYMBALTA) 30 MG capsule Take 2 capsules by mouth once daily   estradiol (ESTRACE) 0.1 MG/GM vaginal cream Place 0.5-1g nightly for 2 weeks, then twice a week after   furosemide (LASIX) 20 MG tablet TAKE 1 TABLET BY MOUTH DAILY AS  NEEDED FOR FLUID   levothyroxine (SYNTHROID) 75 MCG tablet Take 1 tablet (75 mcg total) by mouth daily before breakfast.   lidocaine  (XYLOCAINE) 5 % ointment Apply 1 Application topically as needed. Up to 3 times a day   losartan-hydrochlorothiazide (HYZAAR) 100-12.5 MG tablet Take 1 tablet by mouth daily.   Omega-3 Fatty Acids (FISH OIL) 1000 MG CAPS Take 1,000 mg by mouth in the morning.   pantoprazole (PROTONIX) 40 MG tablet Take 1 tablet (40 mg total) by mouth 2 (two) times daily before a meal.   predniSONE (DELTASONE) 20 MG tablet Take 2 tablets (40 mg total) by mouth daily with breakfast for 5 days. 2 po daily for 5 days   Trospium Chloride 60 MG CP24 Take 1 capsule (60 mg total) by mouth daily.   valACYclovir (VALTREX) 1000 MG tablet TAKE 1 TABLET BY MOUTH TWICE  DAILY FOR 7 DAYS; IF HAVING  ANOTHER FLARE, TAKE 2 TABLETS  TWICE DAILY FOR 1 DAY ONLY PER  FLAREUP   vitamin B-12 (CYANOCOBALAMIN) 1000 MCG tablet Take 1,000 mcg by mouth in the morning.   [DISCONTINUED] benzonatate (TESSALON PERLES) 100 MG capsule Take 1 capsule (100 mg total) by mouth 3 (three) times daily as needed for cough.   Facility-Administered Encounter Medications as of 10/25/2023  Medication   lidocaine HCl (PF) (XYLOCAINE) 2 % injection 8 mL    Allergies  Allergen Reactions   Tramadol Other (See Comments)    Unsure of reaction    Pertinent ROS per HPI, otherwise unremarkable      Objective:  BP (!) 159/83   Pulse 99   Temp (!) 97.5 F (36.4 C)   Ht 4' 11.25" (1.505 m)   Wt 249 lb 9.6 oz (113.2 kg)   SpO2 93%   BMI 49.99 kg/m    Wt Readings from Last 3 Encounters:  10/25/23 249 lb 9.6 oz (113.2 kg)  09/06/23 241 lb 12.8 oz (109.7 kg)  09/06/23 243 lb 9.6 oz (110.5 kg)    Physical Exam Vitals and nursing note reviewed.  Constitutional:      Appearance: Normal appearance. She is morbidly obese.  HENT:     Head: Normocephalic and atraumatic.     Right Ear: A middle ear effusion is present. Tympanic membrane is not erythematous.     Left Ear: A middle ear effusion is  present. Tympanic membrane is not erythematous.     Nose:  Nose normal.     Mouth/Throat:     Mouth: Mucous membranes are moist.     Pharynx: Oropharynx is clear. No oropharyngeal exudate or posterior oropharyngeal erythema.  Eyes:     Conjunctiva/sclera: Conjunctivae normal.     Pupils: Pupils are equal, round, and reactive to light.  Cardiovascular:     Rate and Rhythm: Normal rate and regular rhythm.     Heart sounds: Normal heart sounds.  Pulmonary:     Effort: Pulmonary effort is normal.     Breath sounds: Wheezing present. No rhonchi.  Musculoskeletal:     Cervical back: Neck supple.  Skin:    General: Skin is warm and dry.     Capillary Refill: Capillary refill takes less than 2 seconds.  Neurological:     General: No focal deficit present.     Mental Status: She is alert and oriented to person, place, and time.  Psychiatric:        Mood and Affect: Mood normal.        Behavior: Behavior normal. Behavior is cooperative.        Thought Content: Thought content normal.        Judgment: Judgment normal.     Results for orders placed or performed during the hospital encounter of 10/04/23  Urine Culture   Collection Time: 10/04/23  5:08 PM   Specimen: Urine, Random  Result Value Ref Range   Specimen Description URINE, RANDOM    Special Requests      NONE Performed at [facility], [address], [place], [place] [address]    Culture 50,000 COLONIES/mL PROTEUS MIRABILIS (A)    Report Status 10/07/2023 FINAL    Organism ID, Bacteria PROTEUS MIRABILIS (A)       Susceptibility   Proteus mirabilis - MIC*    AMPICILLIN <=2 SENSITIVE Sensitive     CEFAZOLIN <=4 SENSITIVE Sensitive     CEFEPIME <=0.12 SENSITIVE Sensitive     CEFTRIAXONE <=0.25 SENSITIVE Sensitive     CIPROFLOXACIN <=0.25 SENSITIVE Sensitive     GENTAMICIN <=1 SENSITIVE Sensitive     IMIPENEM 1 SENSITIVE Sensitive     NITROFURANTOIN 128 RESISTANT Resistant     TRIMETH/SULFA <=20 SENSITIVE Sensitive     AMPICILLIN/SULBACTAM <=2 SENSITIVE Sensitive      PIP/TAZO <=4 SENSITIVE Sensitive ug/mL    * 50,000 COLONIES/mL PROTEUS MIRABILIS       Pertinent labs & imaging results that were available during my care of the patient were reviewed by me and considered in my medical decision making.  Assessment & Plan:  Nalleli "Pam" was seen today for cough, wheezing and chills.  Diagnoses and all orders for this visit:  Bronchitis -     azithromycin (ZITHROMAX Z-PAK) 250 MG tablet; As directed -     benzonatate (TESSALON PERLES) 100 MG capsule; Take 1 capsule (100 mg total) by mouth 3 (three) times daily as needed for cough. -     predniSONE (DELTASONE) 20 MG tablet; Take 2 tablets (40 mg total) by mouth daily with breakfast for 5 days. 2 po daily for 5 days  Acute cough -     Veritor Flu A/B Waived -     COVID-19, Flu A+B and RSV -     azithromycin (ZITHROMAX Z-PAK) 250 MG tablet; As directed -     benzonatate (TESSALON PERLES) 100 MG capsule; Take 1 capsule (100 mg total) by  mouth 3 (three) times daily as needed for cough. -     predniSONE (DELTASONE) 20 MG tablet; Take 2 tablets (40 mg total) by mouth daily with breakfast for 5 days. 2 po daily for 5 days  Wheezing -     benzonatate (TESSALON PERLES) 100 MG capsule; Take 1 capsule (100 mg total) by mouth 3 (three) times daily as needed for cough. -     predniSONE (DELTASONE) 20 MG tablet; Take 2 tablets (40 mg total) by mouth daily with breakfast for 5 days. 2 po daily for 5 days     Assessment and Plan    Asthma exacerbation with possible bronchitis Presents with wheezing, coughing, chest tightness, and chills since Tuesday. Asthma exacerbation suspected, possibly complicated by bronchitis, indicated by fluid behind the ears. Rapid flu test negative; COVID, RSV, and flu results pending. Suspected respiratory infection contributing to exacerbation. Emphasized importance of albuterol and prednisone for bronchial inflammation. Tessalon prescribed for cough, though previous use was  ineffective. - Prescribe Zithromax (azithromycin) with a loading dose of two pills today, then one pill daily until finished. - Prescribe prednisone, two pills every morning until finished. - Prescribe Tessalon (benzonatate), one to two pills every four to six hours as needed for cough. - Instruct to use albuterol inhaler every four to six hours for the next two days while awake. - Advise to use Flonase if available at home. - Await results of COVID, RSV, and flu tests and inform her of results.          Continue all other maintenance medications.  Follow up plan: Return if symptoms worsen or fail to improve.   Continue healthy lifestyle choices, including diet (rich in fruits, vegetables, and lean proteins, and low in salt and simple carbohydrates) and exercise (at least 30 minutes of moderate physical activity daily).   The above assessment and management plan was discussed with the patient. The patient verbalized understanding of and has agreed to the management plan. Patient is aware to call the clinic if they develop any new symptoms or if symptoms persist or worsen. Patient is aware when to return to the clinic for a follow-up visit. Patient educated on when it is appropriate to go to the emergency department.   Kari Baars, FNP-C [facility] [phone]

## 2023-10-25 NOTE — Telephone Encounter (Signed)
  Chief Complaint: Asthma concerns Symptoms: Wheezing/cough Frequency: 2-3 days Pertinent Negatives: Patient denies fever, CP Disposition: [] ED /[] Urgent Care (no appt availability in office) / [x] Appointment(In office/virtual)/ []  [facility]/ [] Home Care/ [] Refused Recommended Disposition /[] [facility]/ []  Follow-up with PCP Additional Notes: patient called with concerns for wheezing and cough. Patient has history of asthma-patient is concerned that her inhalers aren't helping with the wheezing. Patient said "I feel like this has gone into bronchitis." Per protocol, patient is recommended to be seen today. Appointment made for today at 11:20 am with another provider in office. Patient verbalized understanding of plan and all questions answered.    Copied from CRM [id number]. Topic: Clinical - Red Word Triage >> Oct 25, 2023  8:47 AM Ivette P wrote: Red Word that prompted transfer to Nurse Triage: Pt is having asthma attack - going on for 2-3 days. Reason for Disposition  [1] MILD difficulty breathing (e.g., minimal/no SOB at rest, SOB with walking, pulse <100) AND [2] NEW-onset or WORSE than normal  Answer Assessment - Initial Assessment Questions 1. RESPIRATORY STATUS: "Describe your breathing?" (e.g., wheezing, shortness of breath, unable to speak, severe coughing)      wheezing 2. ONSET: "When did this breathing problem begin?"      2-3 days ago 3. PATTERN "Does the difficult breathing come and go, or has it been constant since it started?"      constant 4. SEVERITY: "How bad is your breathing?" (e.g., mild, moderate, severe)    - MILD: No SOB at rest, mild SOB with walking, speaks normally in sentences, can lie down, no retractions, pulse < 100.    - MODERATE: SOB at rest, SOB with minimal exertion and prefers to sit, cannot lie down flat, speaks in phrases, mild retractions, audible wheezing, pulse 100-120.    - SEVERE: Very SOB at rest, speaks in single words,  struggling to breathe, sitting hunched forward, retractions, pulse > 120      Moderate 5. RECURRENT SYMPTOM: "Have you had difficulty breathing before?" If Yes, ask: "When was the last time?" and "What happened that time?"      Yes-several years 6. CARDIAC HISTORY: "Do you have any history of heart disease?" (e.g., heart attack, angina, bypass surgery, angioplasty)      No 7. LUNG HISTORY: "Do you have any history of lung disease?"  (e.g., pulmonary embolus, asthma, emphysema)     asthma 8. CAUSE: "What do you think is causing the breathing problem?"      Asthma that patient thinks she may have gone into bronchitis 9. OTHER SYMPTOMS: "Do you have any other symptoms? (e.g., dizziness, runny nose, cough, chest pain, fever)     Cough, 10. O2 SATURATION MONITOR:  "Do you use an oxygen saturation monitor (pulse oximeter) at home?" If Yes, ask: "What is your reading (oxygen level) today?" "What is your usual oxygen saturation reading?" (e.g., 95%)       N/A 12. TRAVEL: "Have you traveled out of the country in the last month?" (e.g., travel history, exposures)       No  Protocols used: Breathing Difficulty-A-AH

## 2023-10-28 LAB — COVID-19, FLU A+B AND RSV
Influenza A, NAA: NOT DETECTED
Influenza B, NAA: NOT DETECTED
RSV, NAA: NOT DETECTED
SARS-CoV-2, NAA: NOT DETECTED

## 2023-10-29 ENCOUNTER — Encounter: Payer: Self-pay | Admitting: Family Medicine

## 2023-10-30 ENCOUNTER — Encounter: Payer: Self-pay | Admitting: Family Medicine

## 2023-10-30 ENCOUNTER — Ambulatory Visit: Admitting: Family Medicine

## 2023-10-30 ENCOUNTER — Ambulatory Visit: Payer: Self-pay | Admitting: Family Medicine

## 2023-10-30 VITALS — BP 130/80 | HR 91 | Temp 98.2°F | Ht 59.25 in | Wt 246.0 lb

## 2023-10-30 DIAGNOSIS — R062 Wheezing: Secondary | ICD-10-CM

## 2023-10-30 DIAGNOSIS — H66001 Acute suppurative otitis media without spontaneous rupture of ear drum, right ear: Secondary | ICD-10-CM | POA: Diagnosis not present

## 2023-10-30 DIAGNOSIS — R051 Acute cough: Secondary | ICD-10-CM | POA: Diagnosis not present

## 2023-10-30 MED ORDER — AMOXICILLIN-POT CLAVULANATE 875-125 MG PO TABS: 1.0000 | ORAL_TABLET | Freq: Two times a day (BID) | ORAL | 0 refills | Status: AC

## 2023-10-30 MED ORDER — HYDROCODONE BIT-HOMATROP MBR 5-1.5 MG/5ML PO SOLN: 2.5000 mL | Freq: Four times a day (QID) | ORAL | 0 refills | Status: DC | PRN

## 2023-10-30 MED ORDER — IPRATROPIUM-ALBUTEROL 0.5-2.5 (3) MG/3ML IN SOLN
3.0000 mL | Freq: Once | RESPIRATORY_TRACT | Status: AC
  Administered 2023-10-30: 3 mL via RESPIRATORY_TRACT

## 2023-10-30 NOTE — Progress Notes (Signed)
 Subjective:  Patient ID: Stacie Russell, female    DOB: 11-03-1950, 73 y.o.   MRN: [id number]  Patient Care Team: Raliegh Ip, DO as PCP - General (Family Medicine) Runell Gess, MD as PCP - Cardiology (Cardiology) Bjorn Pippin, MD (Urology) Huel Cote, MD (Obstetrics and Gynecology) Iva Boop, MD (Gastroenterology) Ollen Gross, MD as Consulting Physician (Orthopedic Surgery) Artis Delay, MD as Consulting Physician (Hematology and Oncology)   Chief Complaint:  asthma/bronchitis  HPI: Stacie Russell is a 73 y.o. female presenting on 10/30/2023 for asthma/bronchitis Patient presents today with continued cough. She was seen 10/25/23 by [facility] and provided zpack, tessalon perles, and prednisone burst for asthma exacerbation complicated by bronchitis.  Reports that she still has wheezing,cough, productive with clear sputum. She has chest soreness and feels that she cannot get a deep breath. Finished prednisone and azithromycin.   Relevant past medical, surgical, family, and social history reviewed and updated as indicated.  Allergies and medications reviewed and updated. Data reviewed: Chart in Epic.   Past Medical History:  Diagnosis Date   Arthritis    Asthma    Diverticulosis    Dyspnea    with exertion    Endometrial hyperplasia    Esophageal stricture    Family history of breast cancer 01/26/2021   Foot pain    Fundic gland polyps of stomach, benign 07/10/2015   Gastritis    GERD (gastroesophageal reflux disease)    Hemorrhoid    Hiatal hernia    Hip pain    History of kidney stones    cystoscopy   History of surgery on arm    left humerus   Hyperlipidemia    Hypertension    Hypothyroidism    Knee pain    Metabolic syndrome    Obstructive sleep apnea 08/27/2017   pt denies    Oxygen deficiency    pt denies   PONV (postoperative nausea and vomiting)    Spinal headache    few headaches recently    Tendonitis of ankle    Vitamin  D deficiency     Past Surgical History:  Procedure Laterality Date   ANAL FISSURE REPAIR     CATARACT EXTRACTION  May 2009   COLONOSCOPY  12/26/07   ESOPHAGEAL DILATION     ESOPHAGOGASTRODUODENOSCOPY  12/26/07, 05/13/08   EYE SURGERY Bilateral    bilateral cataract with lens implants   IR IMAGING GUIDED PORT INSERTION  01/25/2021   IR REMOVAL TUN ACCESS W/ PORT W/O FL MOD SED  10/08/2023   KNEE CARTILAGE SURGERY Right Sept 1999   ORIF HUMERUS FRACTURE Left 01/12/2020   Procedure: OPEN REDUCTION INTERNAL FIXATION (ORIF) LEFT HUMERUS FRACTURE;  Surgeon: Dominica Severin, MD;  Location: [facility] OR;  Service: Orthopedics;  Laterality: Left;   ROBOTIC ASSISTED TOTAL HYSTERECTOMY WITH BILATERAL SALPINGO OOPHERECTOMY Bilateral 12/29/2020   Procedure: XI ROBOTIC ASSISTED TOTAL HYSTERECTOMY WITH BILATERAL SALPINGO OOPHORECTOMY;  Surgeon: Adolphus Birchwood, MD;  Location: [facility];  Service: Gynecology;  Laterality: Bilateral;   SENTINEL NODE BIOPSY N/A 12/29/2020   Procedure: SENTINEL NODE BIOPSY;  Surgeon: Adolphus Birchwood, MD;  Location: [facility];  Service: Gynecology;  Laterality: N/A;   TOTAL KNEE ARTHROPLASTY Right 10/07/2017   Procedure: RIGHT TOTAL KNEE ARTHROPLASTY;  Surgeon: Ollen Gross, MD;  Location: [facility];  Service: Orthopedics;  Laterality: Right;  Adductor Block   UMBILICAL HERNIA REPAIR  05/20/06    Social History   Socioeconomic History   Marital status: Married  Spouse name: Maisie Fus   Number of children: 2   Years of education: 14   Highest education level: Associate degree: academic program  Occupational History   Occupation: Retired  Tobacco Use   Smoking status: Never   Smokeless tobacco: Never  Vaping Use   Vaping status: Never Used  Substance and Sexual Activity   Alcohol use: No   Drug use: No   Sexual activity: Not Currently    Birth control/protection: Post-menopausal  Other Topics Concern   Not on file  Social History Narrative   Married and retired   No EtOH, tobacco or  drug use   Social Drivers of Corporate investment banker Strain: Low Risk  (05/23/2023)   Overall Financial Resource Strain (CARDIA)    Difficulty of Paying Living Expenses: Not hard at all  Food Insecurity: No Food Insecurity (05/23/2023)   Hunger Vital Sign    Worried About Running Out of Food in the Last Year: Never true    Ran Out of Food in the Last Year: Never true  Transportation Needs: No Transportation Needs (05/23/2023)   PRAPARE - Administrator, Civil Service (Medical): No    Lack of Transportation (Non-Medical): No  Physical Activity: Inactive (05/23/2023)   Exercise Vital Sign    Days of Exercise per Week: 0 days    Minutes of Exercise per Session: 0 min  Stress: No Stress Concern Present (05/23/2023)   [employer] of Occupational Health - Occupational Stress Questionnaire    Feeling of Stress : Not at all  Social Connections: Socially Integrated (05/23/2023)   Social Connection and Isolation Panel [NHANES]    Frequency of Communication with Friends and Family: More than three times a week    Frequency of Social Gatherings with Friends and Family: More than three times a week    Attends Religious Services: More than 4 times per year    Active Member of [employer] or Organizations: Yes    Attends Engineer, structural: More than 4 times per year    Marital Status: Married  Catering manager Violence: Not At Risk (05/23/2023)   Humiliation, Afraid, Rape, and Kick questionnaire    Fear of Current or Ex-Partner: No    Emotionally Abused: No    Physically Abused: No    Sexually Abused: No    Outpatient Encounter Medications as of 10/30/2023  Medication Sig   acetaminophen (TYLENOL) 500 MG tablet Take 1,000 mg by mouth as needed (pain.).   albuterol (VENTOLIN HFA) 108 (90 Base) MCG/ACT inhaler Inhale 2 puffs into the lungs every 6 (six) hours as needed for wheezing or shortness of breath. (Patient taking differently: Inhale 2 puffs into the lungs as  needed for wheezing or shortness of breath. PRN)   azithromycin (ZITHROMAX Z-PAK) 250 MG tablet As directed   benzonatate (TESSALON PERLES) 100 MG capsule Take 1 capsule (100 mg total) by mouth 3 (three) times daily as needed for cough.   budesonide-formoterol (SYMBICORT) 80-4.5 MCG/ACT inhaler Inhale 2 puffs into the lungs 2 (two) times daily. (Patient taking differently: Inhale 2 puffs into the lungs 2 (two) times daily as needed.)   busPIRone (BUSPAR) 7.5 MG tablet Take 1 tablet (7.5 mg total) by mouth 2 (two) times daily.   Calcium Carbonate-Vit D-Min (CALCIUM 1200 PO) Take 1 tablet by mouth in the morning.   Cholecalciferol (VITAMIN D3) 2000 units TABS Take 2,000 Units by mouth in the morning.   clobetasol ointment (TEMOVATE) 0.05 % Apply 1 Application  topically 3 (three) times a week. Apply cream to the area where you have had itching 3 times a week at night.   DULoxetine (CYMBALTA) 30 MG capsule Take 2 capsules by mouth once daily   estradiol (ESTRACE) 0.1 MG/GM vaginal cream Place 0.5-1g nightly for 2 weeks, then twice a week after   furosemide (LASIX) 20 MG tablet TAKE 1 TABLET BY MOUTH DAILY AS  NEEDED FOR FLUID   levothyroxine (SYNTHROID) 75 MCG tablet Take 1 tablet (75 mcg total) by mouth daily before breakfast.   lidocaine (XYLOCAINE) 5 % ointment Apply 1 Application topically as needed. Up to 3 times a day   losartan-hydrochlorothiazide (HYZAAR) 100-12.5 MG tablet Take 1 tablet by mouth daily.   Omega-3 Fatty Acids (FISH OIL) 1000 MG CAPS Take 1,000 mg by mouth in the morning.   pantoprazole (PROTONIX) 40 MG tablet Take 1 tablet (40 mg total) by mouth 2 (two) times daily before a meal.   predniSONE (DELTASONE) 20 MG tablet Take 2 tablets (40 mg total) by mouth daily with breakfast for 5 days. 2 po daily for 5 days   Trospium Chloride 60 MG CP24 Take 1 capsule (60 mg total) by mouth daily.   valACYclovir (VALTREX) 1000 MG tablet TAKE 1 TABLET BY MOUTH TWICE  DAILY FOR 7 DAYS; IF HAVING   ANOTHER FLARE, TAKE 2 TABLETS  TWICE DAILY FOR 1 DAY ONLY PER  FLAREUP   vitamin B-12 (CYANOCOBALAMIN) 1000 MCG tablet Take 1,000 mcg by mouth in the morning.   Facility-Administered Encounter Medications as of 10/30/2023  Medication   lidocaine HCl (PF) (XYLOCAINE) 2 % injection 8 mL    Allergies  Allergen Reactions   Tramadol Other (See Comments)    Unsure of reaction   Review of Systems As per HPI  Objective:  BP 130/80   Pulse 91   Temp 98.2 F (36.8 C)   Ht 4' 11.25" (1.505 m)   Wt 246 lb (111.6 kg)   SpO2 90%   BMI 49.27 kg/m    Wt Readings from Last 3 Encounters:  10/25/23 249 lb 9.6 oz (113.2 kg)  09/06/23 241 lb 12.8 oz (109.7 kg)  09/06/23 243 lb 9.6 oz (110.5 kg)   Physical Exam Constitutional:      General: She is awake. She is not in acute distress.    Appearance: Normal appearance. She is well-developed and well-groomed. She is obese. She is ill-appearing. She is not toxic-appearing or diaphoretic.  HENT:     Right Ear: No drainage, swelling or tenderness. A middle ear effusion is present. There is no impacted cerumen. No foreign body. No mastoid tenderness. No PE tube. No hemotympanum. Tympanic membrane is injected, erythematous and bulging. Tympanic membrane is not scarred, perforated or retracted.     Left Ear: No drainage, swelling or tenderness. A middle ear effusion is present. There is no impacted cerumen. No foreign body. No mastoid tenderness. No PE tube. No hemotympanum. Tympanic membrane is injected. Tympanic membrane is not scarred, perforated, erythematous, retracted or bulging.  Cardiovascular:     Rate and Rhythm: Normal rate and regular rhythm.     Pulses: Normal pulses.          Radial pulses are 2+ on the right side and 2+ on the left side.       Posterior tibial pulses are 2+ on the right side and 2+ on the left side.     Heart sounds: Normal heart sounds. No murmur heard.    No  gallop.  Pulmonary:     Effort: Pulmonary effort is normal.  No respiratory distress.     Breath sounds: Decreased air movement present. No stridor. Examination of the right-middle field reveals wheezing. Examination of the left-middle field reveals wheezing. Examination of the right-lower field reveals wheezing. Examination of the left-lower field reveals wheezing. Wheezing present. No rhonchi or rales.     Comments: Increased WOB.  Improved significantly with duoneb Musculoskeletal:     Cervical back: Full passive range of motion without pain and neck supple.     Right lower leg: No edema.     Left lower leg: No edema.  Lymphadenopathy:     Head:     Right side of head: No submental, submandibular, tonsillar, preauricular or posterior auricular adenopathy.     Left side of head: No submental, submandibular, tonsillar, preauricular or posterior auricular adenopathy.  Skin:    General: Skin is warm.     Capillary Refill: Capillary refill takes less than 2 seconds.  Neurological:     General: No focal deficit present.     Mental Status: She is alert, oriented to person, place, and time and easily aroused. Mental status is at baseline.     GCS: GCS eye subscore is 4. GCS verbal subscore is 5. GCS motor subscore is 6.     Motor: No weakness.  Psychiatric:        Attention and Perception: Attention and perception normal.        Mood and Affect: Mood and affect normal.        Speech: Speech normal.        Behavior: Behavior normal. Behavior is cooperative.        Thought Content: Thought content normal. Thought content does not include homicidal or suicidal ideation. Thought content does not include homicidal or suicidal plan.        Cognition and Memory: Cognition and memory normal.        Judgment: Judgment normal.      Results for orders placed or performed in visit on 10/25/23  Veritor Flu A/B Waived   Collection Time: 10/25/23 11:18 AM  Result Value Ref Range   Influenza A Negative Negative   Influenza B Negative Negative  COVID-19, Flu  A+B and RSV   Collection Time: 10/25/23 11:19 AM   Specimen: Nasopharyngeal(NP) swabs in vial transport medium  Result Value Ref Range   SARS-CoV-2, NAA Not Detected Not Detected   Influenza A, NAA Not Detected Not Detected   Influenza B, NAA Not Detected Not Detected   RSV, NAA Not Detected Not Detected   Test Information: Comment     Pertinent labs & imaging results that were available during my care of the patient were reviewed by me and considered in my medical decision making.  Assessment & Plan:  Stacie "Pam" was seen today for asthma/bronchitis.  Diagnoses and all orders for this visit:  Wheezing Will send in medication as below to cover for AOM and pneumonia. Provided cough syrup as below to assist with symptoms. Discussed with patient at length that medication can cause drowsiness, respiratory depression, and places her at risk for confusion. Patient verbalized understanding. Reviewed PDMP. No red flags. Will provide at lower dose given age.  -     amoxicillin-clavulanate (AUGMENTIN) 875-125 MG tablet; Take 1 tablet by mouth 2 (two) times daily for 7 days. -     ipratropium-albuterol (DUONEB) 0.5-2.5 (3) MG/3ML nebulizer solution 3 mL  Acute cough As above.  -  amoxicillin-clavulanate (AUGMENTIN) 875-125 MG tablet; Take 1 tablet by mouth 2 (two) times daily for 7 days. -     HYDROcodone bit-homatropine (HYCODAN) 5-1.5 MG/5ML syrup; Take 2.5 mLs by mouth every 6 (six) hours as needed for cough.  Non-recurrent acute suppurative otitis media of right ear without spontaneous rupture of tympanic membrane As above.  -     amoxicillin-clavulanate (AUGMENTIN) 875-125 MG tablet; Take 1 tablet by mouth 2 (two) times daily for 7 days.     Continue all other maintenance medications.  Follow up plan: Return if symptoms worsen or fail to improve.   Continue healthy lifestyle choices, including diet (rich in fruits, vegetables, and lean proteins, and low in salt and simple  carbohydrates) and exercise (at least 30 minutes of moderate physical activity daily).  Written and verbal instructions provided   The above assessment and management plan was discussed with the patient. The patient verbalized understanding of and has agreed to the management plan. Patient is aware to call the clinic if they develop any new symptoms or if symptoms persist or worsen. Patient is aware when to return to the clinic for a follow-up visit. Patient educated on when it is appropriate to go to the emergency department.   Neale Burly, DNP-FNP [facility] [address] [place], [place] [address] [phone]

## 2023-10-30 NOTE — Telephone Encounter (Signed)
 Reason for Disposition . [1] MILD difficulty breathing (e.g., minimal/no SOB at rest, SOB with walking, pulse <100) AND [2] NEW-onset or WORSE than normal  Protocols used: Breathing Difficulty-A-AH

## 2023-10-30 NOTE — Telephone Encounter (Addendum)
   Chief Complaint: SOB Symptoms: can't take deep breath  Disposition: [] ED /[] Urgent Care (no appt availability in office) / [x] Appointment(In office/virtual)/ []  [place] [facility]/ [] Home Care/ [] Refused Recommended Disposition /[] [facility]/ []  Follow-up with PCP Additional Notes: Pt was seen 3/14 and was treated for asthma and bronchitis. Pt states she doesn't have productive cough, but feels like there is something in chest. Pt stated the "perles aren't  touching this cough."Pt feels her breathing is on shallow side. Pt states she finished antibiotic and is feeling somewhat better. Pt states the inhaler has not  helped. Pt denies fever. Pt has appt today at 1135. RN gave care advice and pt verbalized understanding.          Copied from CRM [id number]. Topic: Clinical - Red Word Triage >> Oct 30, 2023  9:56 AM Priscille Loveless wrote: Red Word that prompted transfer to Nurse Triage: Pt was diagnosed with asthma and bronchitis last week and is no better. Patient states she is really short of breath and feels worse than before. Answer Assessment - Initial Assessment Questions 1. RESPIRATORY STATUS: "Describe your breathing?" (e.g., wheezing, shortness of breath, unable to speak, severe coughing)      Can't take deep breath 2. ONSET: "When did this breathing problem begin?"      Last week 3. PATTERN "Does the difficult breathing come and go, or has it been constant since it started?"      Constant  4. SEVERITY: "How bad is your breathing?" (e.g., mild, moderate, severe)    - MILD: No SOB at rest, mild SOB with walking, speaks normally in sentences, can lie down, no retractions, pulse < 100.    - MODERATE: SOB at rest, SOB with minimal exertion and prefers to sit, cannot lie down flat, speaks in phrases, mild retractions, audible wheezing, pulse 100-120.    - SEVERE: Very SOB at rest, speaks in single words, struggling to breathe, sitting hunched forward, retractions, pulse > 120       Moderate   6. CARDIAC HISTORY: "Do you have any history of heart disease?" (e.g., heart attack, angina, bypass surgery, angioplasty)      Denies  7. LUNG HISTORY: "Do you have any history of lung disease?"  (e.g., pulmonary embolus, asthma, emphysema)     Asthma  8. CAUSE: "What do you think is causing the breathing problem?"      Asthma and bronchitis  9. OTHER SYMPTOMS: "Do you have any other symptoms? (e.g., dizziness, runny nose, cough, chest pain, fever)     Denies all  10. O2 SATURATION MONITOR:  "Do you use an oxygen saturation monitor (pulse oximeter) at home?" If Yes, ask: "What is your reading (oxygen level) today?" "What is your usual oxygen saturation reading?" (e.g., 95%)       na  Protocols used: Breathing Difficulty-A-AH

## 2023-11-04 ENCOUNTER — Encounter: Payer: Self-pay | Admitting: Obstetrics

## 2023-11-04 ENCOUNTER — Ambulatory Visit: Payer: HMO | Admitting: Obstetrics

## 2023-11-04 VITALS — BP 106/65 | HR 77

## 2023-11-04 DIAGNOSIS — N952 Postmenopausal atrophic vaginitis: Secondary | ICD-10-CM | POA: Diagnosis not present

## 2023-11-04 DIAGNOSIS — N3946 Mixed incontinence: Secondary | ICD-10-CM | POA: Diagnosis not present

## 2023-11-04 MED ORDER — GEMTESA 75 MG PO TABS: 75.0000 mg | ORAL_TABLET | Freq: Every day | ORAL | Status: DC

## 2023-11-04 NOTE — Patient Instructions (Signed)
 We discussed the symptoms of overactive bladder (OAB), which include urinary urgency, urinary frequency, night-time urination, with or without urge incontinence.  We discussed management including behavioral therapy (decreasing bladder irritants by following a bladder diet, urge suppression strategies, timed voids, bladder retraining), physical therapy, medication; and for refractory cases posterior tibial nerve stimulation, sacral neuromodulation, and intravesical botulinum toxin injection.   For Beta-3 agonist medication, we discussed the potential side effect of elevated blood pressure which is more likely to occur in individuals with uncontrolled hypertension. You were given samples for Myrbetriq 75 mg.  It can take a month to start working so give it time, but if you have bothersome side effects call sooner and we can try a different medication.  Call us if you have trouble filling the prescription or if it's not covered by your insurance.  Continue Trospium  For refractory OAB we reviewed the procedure for intravesical Botox injection with cystoscopy in the office and reviewed the risks, benefits and alternatives of treatment including but not limited to infection, need for self-catheterization and need for repeat therapy.  We discussed that there is a 5-15% chance of needing to catheterize with Botox and that this usually resolves in a few months; however can persist for longer periods of time.  Typically Botox injections would need to be repeated every 3-12 months since this is not a permanent therapy.   We discussed the role of sacral neuromodulation and how it works. It requires a test phase, and documentation of bladder function via diary. After a successful test period, a permanent wire and generator are placed in the OR. The battery lasts 5 years on average and would need to be replaced surgically.  The goal of this therapy is at least a 50% improvement in symptoms. It is NOT realistic to  expect a 100% cure.  We reviewed the fact that about 30% of patients fail the test phase and are not candidates for permanent generator placement.  We discussed the risk of infection and that the patient would not be able to get an MRI once the device is placed. There are two companies that provide this therapy: [employer]tronic and [employer]. [employer]' product is new and is similar to [employer]'s, but has advantages of a smaller and rechargeable battery and being able to have an MRI with the implant. For all procedures, we discussed risks of bleeding, infection, damage to surrounding organs including bowel, bladder, blood vessels, ureters and nerves, need for further surgery, risk of postoperative urinary incontinence or retention with need to catheterize, recurrent prolapse, numbness and weakness at any body site, buttock pain, and the rarer risks of blood clot, heart attack, pneumonia, death.    We also discussed the role of percutaneous tibial nerve stimulation and how it works.  She understands it requires 12 weekly visits for temporary neuromodulation of the sacral nerve roots via the tibial nerve and that she may then require continued tapered treatment.  She will return for the procedure. All questions were answered.

## 2023-11-04 NOTE — Assessment & Plan Note (Signed)
-   s/p urethral bulking and bladder botox injection 09/05/23 with reduction of SUI but worsening UUI around 2 weeks after procedure - now improved after Trospium, reports desires for additional relief due to one day of increased symptomatic relief with trospium 2x/day - samples of Gemtesa for trial of 2 medications to reassess symptoms - catheterized UA to r/o UTI, Rx macrobid for presumed UTI changed to Bactrim with resolution of UTI symptoms - PVR 50mL, repeat CIC teaching however patient unable to perform. Encouraged pt to have husband present to follow-up for teaching if symptoms worsens - pt to request ROI from Dr. 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 Gemtesa ($100) with no relief.  - Trospium sent to [facility] due to cost - We discussed the symptoms of overactive bladder (OAB), which include urinary urgency, urinary frequency, nocturia, with or without urge incontinence.  While we do not know the exact etiology of OAB, several treatment options exist. We discussed management including behavioral therapy (decreasing bladder irritants, urge suppression strategies, timed voids, bladder retraining), physical therapy, medication; for refractory cases posterior tibial nerve stimulation, sacral neuromodulation, and intravesical botulinum toxin injection.  - reviewed treatment options, may consider SNM if refractory symptoms or repeat botox at 200U with handout provided and reviewed. Patient dislike idea of repeated botox injections needed - UDS 07/17/23 with increased sensation, decreased capacity at , SUI at and DO. Pressure flow with normal PVR, normal detrusor activity and synergic EMG.  - For refractory OAB we reviewed the procedure for intravesical Botox injection with cystoscopy in the office and reviewed the risks, benefits and alternatives of treatment including but not limited to infection, need  for self-catheterization and need for repeat therapy.  We discussed that there is a 5-15% chance of needing to catheterize with Botox and that this usually resolves in a few months; however can persist for longer periods of time.  Typically Botox injections would need to be repeated every 3-12 months since this is not a permanent therapy.   - For treatment of stress urinary incontinence,  non-surgical options include expectant management, weight loss, physical therapy, as well as a pessary.  Surgical options include a midurethral sling, Burch urethropexy, and transurethral injection of a bulking agent. - reviewed risks and benefits of botox injection, SNM for OAB, periurethral bulking and midurethral sling for SUI.

## 2023-11-04 NOTE — Progress Notes (Signed)
 [facility] Return Visit  SUBJECTIVE  History of Present Illness: Stacie Russell is a 73 y.o. female seen in follow-up for urethral bulking and bladder botox injection 09/05/23.   Reports relief with trospium after 36-48 hours started around 2 weeks ago Cutout coffee.  Took 2 tabs once with improvement of leakage and frequency  Using estradiol every 3 days Nuswab positive BV, treated with flagyl with resolution of burning.  Continues antibiotics for bronchitis, slowly improving.  Urine culture 50K proteus resistant to nitrofurantoin, changed to Bactrim and denies UTI symptoms today.  Reports URI after procedure slowing improving Initially reported decrease force of stream and worsening leakage. Leaks with urgency 7-8x/day down from 10-12/day (baseline 5-6x/day) Pad use:  increased to 6-7pads/day down from 8 pad/day up (previously 5-6  pads/day) Day time voids 12-15 up from 7-10.  Nocturia: 1x/night down from 2-4 times per night to void with unclear history of OSA and did not start Bipap use   Prior resolution of burning with urination after flagyl. Negative urine culture 07/17/23 and UA + trace leuk Nuswab + BV s/p flagyl 07/01/23 Prior use of pyridium with some relief Tried Kegels, pelvic floor PT with some relief however discontinued due to cost and left sided PFM tension on exam, PTNS (no relief), and Toviaz (cost prohibitive), Ditropan with relief, mirabegron medications with some relief, and gemtesa with no relief.  Reports loose stool attributed to diet around the holidays, denies FI. Denies fiber supplementation. However recent bowel movement improved since stopping oxybutynin.   Past Medical History: Patient  has a past medical history of Arthritis, Asthma, Diverticulosis, Dyspnea, Endometrial hyperplasia, Esophageal stricture, Family history of breast cancer (01/26/2021), Foot pain, Fundic gland polyps of stomach, benign (07/10/2015), Gastritis, GERD  (gastroesophageal reflux disease), Hemorrhoid, Hiatal hernia, Hip pain, History of kidney stones, History of surgery on arm, Hyperlipidemia, Hypertension, Hypothyroidism, Knee pain, Metabolic syndrome, Obstructive sleep apnea (08/27/2017), Oxygen deficiency, PONV (postoperative nausea and vomiting), Spinal headache, Tendonitis of ankle, and Vitamin D deficiency.   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   Medications: She has a current medication list which includes the following prescription(s): gemtesa, acetaminophen, albuterol, amoxicillin-clavulanate, benzonatate, budesonide-formoterol, buspirone, calcium carbonate-vit d-min, vitamin d3, clobetasol ointment, duloxetine, estradiol, furosemide, hydrocodone bit-homatropine, levothyroxine, lidocaine, losartan-hydrochlorothiazide, fish oil, pantoprazole, trospium chloride, valacyclovir, and cyanocobalamin.   Allergies: Patient is allergic to tramadol.   Social History: Patient  reports that she has never smoked. She has never used smokeless tobacco. She reports that she does not drink alcohol and does not use drugs.     OBJECTIVE     Physical Exam: Vitals:   11/04/23 1118  BP: 106/65  Pulse: 77  SpO2: 98%         ASSESSMENT AND PLAN    [name] is a 73 y.o. with:  1. Mixed stress and urge urinary incontinence   2. Vaginal atrophy   3.  Obesity, Class III, BMI 40-49.9 (morbid obesity) (HCC)      Mixed stress and urge urinary incontinence Assessment & Plan: - s/p urethral bulking  and bladder botox injection 09/05/23 with reduction of SUI but worsening UUI around 2 weeks after procedure - now improved after Trospium, reports desires for additional relief due to one day of increased symptomatic relief with trospium 2x/day - samples of Gemtesa for trial of 2 medications to reassess symptoms - catheterized UA to r/o UTI, Rx macrobid for presumed UTI changed to Bactrim with resolution of UTI symptoms - PVR 50mL, repeat CIC teaching however patient unable to perform. Encouraged pt to have husband present to follow-up for teaching if symptoms worsens - pt to request ROI from Dr. 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 Gemtesa ($100) with no relief.  - Trospium sent to [facility] due to cost - We discussed the symptoms of overactive bladder (OAB), which include urinary urgency, urinary frequency, nocturia, with or without urge incontinence.  While we do not know the exact etiology of OAB, several treatment options exist. We discussed management including behavioral therapy (decreasing bladder irritants, urge suppression strategies, timed voids, bladder retraining), physical therapy, medication; for refractory cases posterior tibial nerve stimulation, sacral neuromodulation, and intravesical botulinum toxin injection.  - reviewed treatment options, may consider SNM if refractory symptoms or repeat botox at 200U with handout provided and reviewed. Patient dislike idea of repeated botox injections needed - UDS 07/17/23 with increased sensation, decreased capacity at , SUI at and DO. Pressure flow with normal PVR, normal detrusor activity and synergic EMG.  - For refractory OAB we reviewed the procedure for intravesical Botox injection with  cystoscopy in the office and reviewed the risks, benefits and alternatives of treatment including but not limited to infection, need for self-catheterization and need for repeat therapy.  We discussed that there is a 5-15% chance of needing to catheterize with Botox and that this usually resolves in a few months; however can persist for longer periods of time.  Typically Botox injections would need to be repeated every 3-12 months since this is not a permanent therapy.   - For treatment of stress urinary incontinence,  non-surgical options include expectant management, weight loss, physical therapy, as well as a pessary.  Surgical options include a midurethral sling, Burch urethropexy, and transurethral injection of a bulking agent. - reviewed risks and benefits of botox injection, SNM for OAB, periurethral bulking and midurethral sling for SUI.   Orders: Leslye Peer; Take 1 tablet (75 mg total) by mouth daily.  Vaginal atrophy Assessment & Plan: - For symptomatic vaginal atrophy options include lubrication with a water-based lubricant, personal hygiene measures and barrier protection against wetness, and estrogen replacement in the form of vaginal cream, vaginal tablets, or a time-released vaginal ring.   - continue low dose vaginal estrogen    Obesity, Class III, BMI 40-49.9 (morbid obesity) (HCC) Assessment & Plan: - discussed association with pelvic floor disorders - reviewed 5-8% weight reduction with improvement of urinary symptoms - encouraged to resume exercises classes she previously attended with improvement of overall wellbeing    Time spent: I spent 26 minutes dedicated to the care of this patient on the date of this encounter to include pre-visit review of records, face-to-face time with the patient discussing mixed urinary incontinence, vaginal atrophy, BMI, and post visit documentation and ordering medication.   Loleta Chance, MD

## 2023-11-04 NOTE — Assessment & Plan Note (Signed)
-   discussed association with pelvic floor disorders - reviewed 5-8% weight reduction with improvement of urinary symptoms - encouraged to resume exercises classes she previously attended with improvement of overall wellbeing

## 2023-11-04 NOTE — Assessment & Plan Note (Signed)
-   For symptomatic vaginal atrophy options include lubrication with a water-based lubricant, personal hygiene measures and barrier protection against wetness, and estrogen replacement in the form of vaginal cream, vaginal tablets, or a time-released vaginal ring.   - continue low dose vaginal estrogen

## 2023-12-13 ENCOUNTER — Encounter: Payer: Self-pay | Admitting: Radiology

## 2023-12-24 ENCOUNTER — Telehealth: Payer: Self-pay

## 2023-12-24 NOTE — Telephone Encounter (Signed)
 Stacie Russell called stating she needs to reschedule her appointment with Dr.Tucker on 5/23, she will be out of town.   Next available for Dr.Tucker is 7/10 @ 1:00pm. (Patient declined an earlier appointment with Dr.Jackson-Moore)

## 2023-12-25 NOTE — Progress Notes (Unsigned)
  Urogynecology Return Visit  SUBJECTIVE  History of Present Illness: Stacie Russell is a 73 y.o. female seen in 3 month follow-up for urethral bulking and bladder botox  injection 09/05/23.   Prior relief with trospium  after 2 tabs, changed to Trospium  and Gemtesa .  Stopped after 7 days of Trospium   Cutout coffee Leaks continuously per patient, urgency > stress Pad use: 4-5 from baseline 5-6 pads per day.   Patient is bothered by UI symptoms.   Day time voids 10-12.  Nocturia: 1 down from 2-4 times per night to void with  Using estradiol  every 3 days Denies vaginal burning or discharge  Tried Kegels, pelvic floor PT with some relief however discontinued due to cost and left sided PFM tension on exam, PTNS (no relief), and Toviaz (cost prohibitive), Ditropan  with relief, mirabegron  medications with some relief, and gemtesa  alone with no relief.  Reports loose stool attributed to diet around the holidays, denies FI. Denies fiber supplementation. Bowel movements improved since stopping oxybutynin .   Past Medical History: Patient  has a past medical history of Arthritis, Asthma, Diverticulosis, Dyspnea, Endometrial hyperplasia, Esophageal stricture, Family history of breast cancer (01/26/2021), Foot pain, Fundic gland polyps of stomach, benign (07/10/2015), Gastritis, GERD (gastroesophageal reflux disease), Hemorrhoid, Hiatal hernia, Hip pain, History of kidney stones, History of surgery on arm, Hyperlipidemia, Hypertension, Hypothyroidism, Knee pain, Metabolic syndrome, Obstructive sleep apnea (08/27/2017), Oxygen deficiency, PONV (postoperative nausea and vomiting), Spinal headache, Tendonitis of ankle, and Vitamin D  deficiency.   Past Surgical History: She  has a past surgical history that includes Cataract extraction (May 2009); Knee cartilage surgery (Right, Sept 1999); Umbilical hernia repair (05/20/06); Esophagogastroduodenoscopy (12/26/07, 05/13/08); Colonoscopy (12/26/07); Anal  fissure repair; Eye surgery (Bilateral); Total knee arthroplasty (Right, 10/07/2017); Esophageal dilation; ORIF humerus fracture (Left, 01/12/2020); Robotic assisted total hysterectomy with bilateral salpingo oophorectomy (Bilateral, 12/29/2020); Sentinel node biopsy (N/A, 12/29/2020); IR IMAGING GUIDED PORT INSERTION (01/25/2021); and IR REMOVAL TUN ACCESS W/ PORT W/O FL MOD SED (10/08/2023).   Medications: She has a current medication list which includes the following prescription(s): acetaminophen , albuterol , benzonatate , budesonide -formoterol , buspirone , calcium  carbonate-vit d-min, vitamin d3, clobetasol  ointment, duloxetine , estradiol , furosemide , hydrocodone  bit-homatropine, levothyroxine , lidocaine , losartan -hydrochlorothiazide , fish oil, pantoprazole , trospium  chloride, valacyclovir , gemtesa , gemtesa , and cyanocobalamin.   Allergies: Patient is allergic to tramadol .   Social History: Patient  reports that she has never smoked. She has never used smokeless tobacco. She reports that she does not drink alcohol and does not use drugs.     OBJECTIVE     Physical Exam: Vitals:   12/27/23 0856 12/27/23 0941  BP: (!) 140/83 131/71  Pulse: 84 83  Temp: (!) 97.4 F (36.3 C)     Straight Catheterization Procedure for PVR: After verbal consent was obtained from the patient for catheterization to assess bladder emptying and residual volume the urethra and surrounding tissues were prepped with betadine and an in and out catheterization was performed.  PVR was .  Urine appeared clear yellow. The patient tolerated the procedure well.  CIC teaching: Reviewed anatomy, handwashing, supplies needed for CIC. After verbal consent was obtained from the patient for catheterization. Patient unable to perform with assistance due to limited visualiation.  The patient tolerated the procedure well.    Lab Results  Component Value Date   COLORU Yellow 10/04/2023   CLARITYU Clear 10/04/2023   GLUCOSEUR  Negative 10/04/2023   BILIRUBINUR Negative 10/04/2023   KETONESU Negative 10/04/2023   SPECGRAV 1.020 10/04/2023   RBCUR Negative 10/04/2023   PHUR  6.0 10/04/2023   PROTEINUR Negative 10/04/2023   UROBILINOGEN 0.2 10/04/2023   LEUKOCYTESUR Trace (A) 10/04/2023      ASSESSMENT AND PLAN    Stacie Russell is a 73 y.o. with:  1. Mixed stress and urge urinary incontinence   2. Obesity, Class III, BMI 40-49.9 (morbid obesity)       Mixed stress and urge urinary incontinence Assessment & Plan: - s/p urethral bulking and bladder botox  injection 09/05/23 with reduction of SUI but worsening UUI around 2 weeks after procedure - catheterized for , repeat CIC teaching however pt with limited ability to visualize urethral meatus due to habitus - improved after Trospium , reports desires for additional relief due to one day of increased symptomatic relief with trospium  2x/day - samples of Gemtesa  for trial of 2 medications to reassess symptoms, pt stopped after 7 days. Encouraged pt to resume to reassess symptoms - prior catheterization UA to r/o UTI, Rx macrobid  for presumed UTI changed to Bactrim  with resolution of UTI symptoms - prior PVR 50mL, repeat CIC x 2 teaching however patient unable to perform. Encouraged pt to have husband present to follow-up for teaching if symptoms worsens - pt to request ROI from Stacie Russell office - positive SUI with valsalva on exam - possible prior PTNS, failed oxybutynin , Stacie Russell (cost prohibitive), mirabegron . Gemtesa  ($100) alone with no relief.  - Trospium  sent to [facility] due to cost - We discussed the symptoms of overactive bladder (OAB), which include urinary urgency, urinary frequency, nocturia, with or without urge incontinence.  While we do not know the exact etiology of OAB, several treatment options exist. We discussed management including behavioral therapy (decreasing bladder irritants, urge suppression strategies, timed voids, bladder retraining),  physical therapy, medication; for refractory cases posterior tibial nerve stimulation, sacral neuromodulation, and intravesical botulinum toxin injection.  - reviewed treatment options, may consider SNM if refractory symptoms or repeat botox  at 200U with handout provided and reviewed. Patient dislike idea of repeated botox  injections needed - UDS 07/17/23 with increased sensation, decreased capacity at , SUI at and DO. Pressure flow with normal PVR, normal detrusor activity and synergic EMG.  For refractory OAB we reviewed the procedure for intravesical Botox  injection with cystoscopy in the office and reviewed the risks, benefits and alternatives of treatment including but not limited to infection, need for self-catheterization and need for repeat therapy.  We discussed that there is a 5-15% chance of needing to catheterize with Botox  and that this usually resolves in a few months; however can persist for longer periods of time.  Typically Botox  injections would need to be repeated every 3-12 months since this is not a permanent therapy.   We discussed the role of sacral neuromodulation and how it works. It requires a test phase, and documentation of bladder function via diary. After a successful test period, a permanent wire and generator are placed in the OR. The battery lasts 5 years on average and would need to be replaced surgically.  The goal of this therapy is at least a 50% improvement in symptoms. It is NOT realistic to expect a 100% cure.  We reviewed the fact that about 30% of patients fail the test phase and are not candidates for permanent generator placement.  We discussed the risk of infection and that the patient would not be able to get an MRI once the device is placed. There are two companies that provide this therapy: [employer] and [employer]. [employer]' product is new and is similar to [employer]'s,  but has advantages of a smaller and rechargeable battery and being able to have an MRI  with the implant. For all procedures, we discussed risks of bleeding, infection, damage to surrounding organs including bowel, bladder, blood vessels, ureters and nerves, need for further surgery, risk of postoperative urinary incontinence or retention with need to catheterize, recurrent prolapse, numbness and weakness at any body site, buttock pain, and the rarer risks of blood clot, heart attack, pneumonia, death.    We also discussed the role of percutaneous tibial nerve stimulation and how it works.  She understands it requires 12 weekly visits for temporary neuromodulation of the sacral nerve roots via the tibial nerve and that she may then require continued tapered treatment.   - desires to avoid repeat botox  injections - encouraged to consider other 3rd line options if refractory after 2 OAB medications for 4-6 weeks - discussed weight reduction, referral to medical weight mgmt  - For treatment of stress urinary incontinence,  non-surgical options include expectant management, weight loss, physical therapy, as well as a pessary.  Surgical options include a midurethral sling, Burch urethropexy, and transurethral injection of a bulking agent. - reviewed risks and benefits of botox  injection, SNM for OAB, periurethral bulking and midurethral sling for SUI.   Orders: -     Amb Ref to Medical Weight Management -     POCT urinalysis dipstick -     Urine Culture; Future  Obesity, Class III, BMI 40-49.9 (morbid obesity) Assessment & Plan: - discussed association with pelvic floor disorders - reviewed 5-8% weight reduction with improvement of urinary symptoms - encouraged to resume exercises classes she previously attended with improvement of overall wellbeing - referral to medical weight mgmt   Other orders -     Gemtesa ; Take 1 tablet (75 mg total) by mouth daily.  Dispense: 7 tablet  Time spent: I spent 58 minutes dedicated to the care of this patient on the date of this encounter to  include pre-visit review of records, face-to-face time with the patient discussing mixed urinary incontinence, BMI, and post visit documentation and ordering medication/referral.   Darlene Ehlers, MD

## 2023-12-27 ENCOUNTER — Ambulatory Visit: Admitting: Obstetrics

## 2023-12-27 ENCOUNTER — Encounter: Payer: Self-pay | Admitting: Obstetrics

## 2023-12-27 ENCOUNTER — Other Ambulatory Visit (HOSPITAL_COMMUNITY)
Admission: RE | Admit: 2023-12-27 | Discharge: 2023-12-27 | Disposition: A | Discharge status: Home / Self Care | Source: Ambulatory Visit | Attending: Obstetrics | Admitting: Obstetrics

## 2023-12-27 VITALS — BP 131/71 | HR 83 | Temp 97.4°F

## 2023-12-27 DIAGNOSIS — N3946 Mixed incontinence: Secondary | ICD-10-CM | POA: Diagnosis not present

## 2023-12-27 DIAGNOSIS — E66813 Obesity, class 3: Secondary | ICD-10-CM | POA: Diagnosis not present

## 2023-12-27 LAB — POCT URINALYSIS DIPSTICK
Bilirubin, UA: NEGATIVE
Blood, UA: NEGATIVE
Glucose, UA: NEGATIVE
Ketones, UA: NEGATIVE
Leukocytes, UA: NEGATIVE
Nitrite, UA: NEGATIVE
Protein, UA: NEGATIVE
Spec Grav, UA: 1.025 (ref 1.010–1.025)
Urobilinogen, UA: 0.2 U/dL
pH, UA: 6 (ref 5.0–8.0)

## 2023-12-27 MED ORDER — GEMTESA 75 MG PO TABS: 75.0000 mg | ORAL_TABLET | Freq: Every day | ORAL | Status: DC

## 2023-12-27 NOTE — Patient Instructions (Addendum)
 Please continue trospium  and restart Gemtesa .  For anticholinergic medications, we discussed the potential side effects of anticholinergics including dry eyes, dry mouth, constipation, rare risks of cognitive impairment and urinary retention.  It can take a month to start working so give it time, but if you have bothersome side effects call sooner and we can try a different medication.  Call us  if you have trouble filling the prescription or if it's not covered by your insurance.  For refractory OAB we reviewed the procedure for intravesical Botox  injection with cystoscopy in the office and reviewed the risks, benefits and alternatives of treatment including but not limited to infection, need for self-catheterization and need for repeat therapy.  We discussed that there is a 5-15% chance of needing to catheterize with Botox  and that this usually resolves in a few months; however can persist for longer periods of time.  Typically Botox  injections would need to be repeated every 3-12 months since this is not a permanent therapy.   We discussed the role of sacral neuromodulation and how it works. It requires a test phase, and documentation of bladder function via diary. After a successful test period, a permanent wire and generator are placed in the OR. The battery lasts 5 years on average and would need to be replaced surgically.  The goal of this therapy is at least a 50% improvement in symptoms. It is NOT realistic to expect a 100% cure.  We reviewed the fact that about 30% of patients fail the test phase and are not candidates for permanent generator placement.  We discussed the risk of infection and that the patient would not be able to get an MRI once the device is placed. There are two companies that provide this therapy: [employer] and [employer]. [employer]' product is new and is similar to [employer]'s, but has advantages of a smaller and rechargeable battery and being able to have an MRI with the implant. For  all procedures, we discussed risks of bleeding, infection, damage to surrounding organs including bowel, bladder, blood vessels, ureters and nerves, need for further surgery, risk of postoperative urinary incontinence or retention with need to catheterize, recurrent prolapse, numbness and weakness at any body site, buttock pain, and the rarer risks of blood clot, heart attack, pneumonia, death.    We also discussed the role of percutaneous tibial nerve stimulation and how it works.  She understands it requires 12 weekly visits for temporary neuromodulation of the sacral nerve roots via the tibial nerve and that she may then require continued tapered treatment.   You will receive a call from medical weight management regarding your options for weight loss. . . .

## 2023-12-27 NOTE — Assessment & Plan Note (Addendum)
 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 Encouraged pt to resume to reassess symptoms.  Provided another 7 days of samples. - prior catheterization UA to r/o UTI, Rx macrobid  for presumed UTI changed to Bactrim  with resolution of UTI symptoms. Repeat catheterized urine culture to r/o infectious etiology. - prior PVR 50mL, repeat [facility] x 2 teaching however patient unable to perform. Encouraged pt to have husband present to follow-up for teaching if symptoms worsens - pt to request ROI from Dr. 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 Gemtesa  ($100) alone with no relief.  - Trospium  sent to [facility] due to cost - We discussed the symptoms of overactive bladder (OAB), which include urinary urgency, urinary frequency, nocturia, with or without urge incontinence.  While we do not know the exact etiology of OAB, several treatment options exist. We discussed management including behavioral therapy (decreasing bladder irritants, urge suppression strategies, timed voids, bladder retraining), physical therapy, medication; for refractory cases posterior tibial nerve stimulation, sacral neuromodulation, and intravesical botulinum toxin injection.  - reviewed treatment options, may consider SNM if refractory symptoms or repeat botox  at 200U with handout provided and reviewed. Patient dislike idea of repeated botox  injections needed - UDS 07/17/23 with increased sensation, decreased capacity at  , SUI at and DO. Pressure flow with normal PVR, normal detrusor activity and synergic EMG.  For refractory OAB we reviewed the procedure for intravesical Botox  injection with cystoscopy in the office and reviewed the risks, benefits and alternatives of treatment including but not limited to infection, need for self-catheterization and need for repeat therapy.  We discussed that there is a 5-15% chance of needing to catheterize with Botox  and that this usually resolves in a few months; however can persist for longer periods of time.  Typically Botox  injections would need to be repeated every 3-12 months since this is not a permanent therapy.   We discussed the role of sacral neuromodulation and how it works. It requires a test phase, and documentation of bladder function via diary. After a successful test period, a permanent wire and generator are placed in the OR. The battery lasts 5 years on average and would need to be replaced surgically.  The goal of this therapy is at least a 50% improvement in symptoms. It is NOT realistic to expect a 100% cure.  We reviewed the fact that about 30% of patients fail the test phase and are not candidates for permanent generator placement.  We discussed the risk of infection and that the patient would not be able to get an MRI once the device is placed. There are two companies that provide this therapy: [employer] and [employer]. [employer]' product is new and is similar to [employer]'s, but has advantages of a smaller and rechargeable battery and being able to have an MRI with the implant. For all procedures, we discussed risks of bleeding, infection, damage to surrounding organs including bowel, bladder, blood vessels, ureters and nerves, need for further surgery, risk of postoperative urinary incontinence or retention with need to  catheterize, recurrent prolapse, numbness and weakness at any body site, buttock pain, and the rarer risks of blood clot, heart attack,  pneumonia, death.    We also discussed the role of percutaneous tibial nerve stimulation and how it works.  She understands it requires 12 weekly visits for temporary neuromodulation of the sacral nerve roots via the tibial nerve and that she may then require continued tapered treatment.   - desires to avoid repeat botox  injections - encouraged to consider other 3rd line options if refractory after 2 OAB medications for 4-6 weeks - discussed weight reduction, referral to medical weight mgmt  - For treatment of stress urinary incontinence,  non-surgical options include expectant management, weight loss, physical therapy, as well as a pessary.  Surgical options include a midurethral sling, Burch urethropexy, and transurethral injection of a bulking agent. - reviewed risks and benefits of botox  injection, SNM for OAB, periurethral bulking and midurethral sling for SUI.

## 2023-12-27 NOTE — Assessment & Plan Note (Signed)
-   discussed association with pelvic floor disorders - reviewed 5-8% weight reduction with improvement of urinary symptoms - encouraged to resume exercises classes she previously attended with improvement of overall wellbeing - referral to medical weight mgmt

## 2023-12-28 LAB — URINE CULTURE: Culture: NO GROWTH

## 2023-12-30 ENCOUNTER — Ambulatory Visit: Payer: Self-pay | Admitting: Obstetrics

## 2024-01-03 ENCOUNTER — Ambulatory Visit: Payer: HMO | Admitting: Gynecologic Oncology

## 2024-02-20 ENCOUNTER — Other Ambulatory Visit: Payer: Self-pay | Admitting: Gynecologic Oncology

## 2024-02-20 ENCOUNTER — Inpatient Hospital Stay

## 2024-02-20 ENCOUNTER — Encounter: Payer: Self-pay | Admitting: Gynecologic Oncology

## 2024-02-20 ENCOUNTER — Ambulatory Visit: Payer: Self-pay | Admitting: Gynecologic Oncology

## 2024-02-20 ENCOUNTER — Inpatient Hospital Stay: Attending: Gynecologic Oncology | Admitting: Gynecologic Oncology

## 2024-02-20 ENCOUNTER — Other Ambulatory Visit: Payer: Self-pay | Admitting: *Deleted

## 2024-02-20 VITALS — BP 114/69 | HR 73 | Temp 98.0°F | Resp 19 | Wt 250.0 lb

## 2024-02-20 DIAGNOSIS — C541 Malignant neoplasm of endometrium: Secondary | ICD-10-CM | POA: Diagnosis not present

## 2024-02-20 DIAGNOSIS — R300 Dysuria: Secondary | ICD-10-CM

## 2024-02-20 DIAGNOSIS — N9089 Other specified noninflammatory disorders of vulva and perineum: Secondary | ICD-10-CM

## 2024-02-20 DIAGNOSIS — Z9079 Acquired absence of other genital organ(s): Secondary | ICD-10-CM | POA: Insufficient documentation

## 2024-02-20 DIAGNOSIS — Z9221 Personal history of antineoplastic chemotherapy: Secondary | ICD-10-CM | POA: Diagnosis not present

## 2024-02-20 DIAGNOSIS — N952 Postmenopausal atrophic vaginitis: Secondary | ICD-10-CM | POA: Diagnosis not present

## 2024-02-20 DIAGNOSIS — Z90722 Acquired absence of ovaries, bilateral: Secondary | ICD-10-CM | POA: Diagnosis not present

## 2024-02-20 DIAGNOSIS — R32 Unspecified urinary incontinence: Secondary | ICD-10-CM | POA: Insufficient documentation

## 2024-02-20 DIAGNOSIS — R350 Frequency of micturition: Secondary | ICD-10-CM | POA: Insufficient documentation

## 2024-02-20 DIAGNOSIS — Z90710 Acquired absence of both cervix and uterus: Secondary | ICD-10-CM | POA: Diagnosis not present

## 2024-02-20 DIAGNOSIS — Z8542 Personal history of malignant neoplasm of other parts of uterus: Secondary | ICD-10-CM | POA: Diagnosis not present

## 2024-02-20 LAB — URINALYSIS, COMPLETE (UACMP) WITH MICROSCOPIC
Bacteria, UA: NONE SEEN
Bilirubin Urine: NEGATIVE
Glucose, UA: NEGATIVE mg/dL
Hgb urine dipstick: NEGATIVE
Ketones, ur: NEGATIVE mg/dL
Nitrite: NEGATIVE
Protein, ur: NEGATIVE mg/dL
Specific Gravity, Urine: 1.013 (ref 1.005–1.030)
WBC, UA: >50 WBC/hpf (ref 0–5)
pH: 6 (ref 5.0–8.0)

## 2024-02-20 NOTE — Patient Instructions (Signed)
 It was good to see you today.  I do not see or feel any evidence of cancer recurrence on your exam.  We will see you for follow-up in 6 months.  Please call let us  know if you feel the itching on your vulva or that there is a sore spot.  We can always get you in to see Melissa while you are having symptoms to see if there is anything visible or that would need to be biopsied.  As always, if you develop any new and concerning symptoms before your next visit, please call to see me sooner.

## 2024-02-20 NOTE — Progress Notes (Signed)
 Gynecologic Oncology Return Clinic Visit  02/20/24  Reason for Visit: surveillance in the setting of high risk uterine cancer   Treatment History: Oncology History Overview Note  MSI stable, endometrioid grade 2 Neg genetics   Endometrial cancer (HCC)  12/19/2020 Initial Diagnosis   Endometrial cancer (HCC)   12/26/2020 Imaging   1. Lower uterine segment/cervical mass without evidence of metastatic disease. 2. Hepatic steatosis. 3. Aortic atherosclerosis (ICD10-I70.0). Coronary artery calcification.   12/29/2020 Pathology Results   FINAL MICROSCOPIC DIAGNOSIS:   A. SENTINEL LYMPH NODE, RIGHT OBTURATOR, EXCISION:  -  No carcinoma identified in one lymph node (0/1)  -  See comment   B. SENTINEL LYMPH NODE, LEFT OBTURATOR, EXCISION:  -  Micrometastasis involving one lymph node (24mi/1)  -  See comment   C. UTERUS, CERVIX, BILATERAL TUBES AND OVARIES:  Uterus:  -  Endometrioid carcinoma, FIGO grade 2  -  See oncology table and comment below   Cervix:  -  No carcinoma identified   Bilateral Ovaries:  -  No carcinoma identified   Bilateral Fallopian tubes:  -  No carcinoma identified   ONCOLOGY TABLE:   UTERUS, CARCINOMA OR CARCINOSARCOMA: Resection   Procedure: Total hysterectomy and bilateral salpingo-oophorectomy  Histologic Type: Endometrioid carcinoma  Histologic Grade: FIGO grade 2  Myometrial Invasion:       Percentage of Myometrial Invasion: Estimated to be less than 50%  Uterine Serosa Involvement: Not identified  Cervical stromal Involvement: Not identified  Extent of involvement of other tissue/organs: Not identified  Peritoneal/Ascitic Fluid: Not submitted/unknown  Lymphovascular Invasion: Not identified  Regional Lymph Nodes:       Pelvic Lymph Nodes Examined:  2 Sentinel                                0 non-sentinel                                   2 total       Pelvic Lymph Nodes with Metastasis: 0                           Macrometastasis: (>2.0  mm): 0                          Micrometastasis: (>0.2 mm and < 2.0 mm): 1                            Isolated Tumor Cells (<0.2 mm): 0                       Laterality of Lymph Node with Tumor: 0                             Extracapsular Extension: N/A  Distant Metastasis:       Distant Site(s) Involved: N/A  Pathologic Stage Classification (pTNM, AJCC 8th Edition): pT1a, pN43mi  Ancillary Studies: MMR / MSI testing will be ordered  Representative Tumor Block: C1  Comment(s): Cytokeratin AE1/3 was performed on the lymph nodes (parts a and B) and supports the presence of micrometastasis (part B only). Drs. Belvie and [facility] reviewed select parts of the case and agree  with the above diagnosis.   (v4.2.0.1)     12/29/2020 Surgery   Surgeon: Eloy Maurilio Fitch Pre-operative Diagnosis: endometrial cancer grade 3    Operation: Robotic-assisted laparoscopic total hysterectomy with bilateral salpingoophorectomy, SLN biopsy   Surgeon: Eloy Maurilio Fitch  Specimens: uterus, cervix, bilateral tubes and ovaries, right and left obturator SLN           01/22/2021 Cancer Staging   Staging form: Corpus Uteri - Carcinoma and Carcinosarcoma, AJCC 8th Edition - Pathologic stage from 01/22/2021: Stage IIIC1 (pT1a, pN90mi, cM0) - Signed by Lonn Hicks, MD on 01/22/2021 Stage prefix: Initial diagnosis   01/25/2021 Procedure   Successful placement of a right IJ approach Power Port with ultrasound and fluoroscopic guidance. The catheter is ready for use.   02/07/2021 - 05/19/2021 Chemotherapy   Patient is on Treatment Plan : UTERINE Carboplatin  AUC 6 / Paclitaxel  q21d     02/11/2021 Genetic Testing   Negative hereditary cancer genetic testing: no pathogenic variants detected in [facility] Common Hereditary Cancers +RNA Panel.  The report date is February 11, 2021.    The Common Hereditary Cancers + RNA Panel offered by [facility] includes sequencing, deletion/duplication, and RNA testing of the following 47 genes:  APC, ATM, AXIN2, BARD1, BMPR1A, BRCA1, BRCA2, BRIP1, CDH1, CDK4*, CDKN2A (p14ARF)*, CDKN2A (p16INK4a)*, CHEK2, CTNNA1, DICER1, EPCAM (Deletion/duplication testing only), GREM1 (promoter region deletion/duplication testing only), KIT, MEN1, MLH1, MSH2, MSH3, MSH6, MUTYH, NBN, NF1, NHTL1, PALB2, PDGFRA*, PMS2, POLD1, POLE, PTEN, RAD50, RAD51C, RAD51D, SDHB, SDHC, SDHD, SMAD4, SMARCA4. STK11, TP53, TSC1, TSC2, and VHL.  The following genes were evaluated for sequence changes only: SDHA and HOXB13 c.251G>A variant only.  RNA analysis is not performed for the * genes.     06/14/2021 Imaging   1. Interval hysterectomy and bilateral salpingo oophorectomy. No findings of residual/recurrent malignancy. 2. Substantial lumbar spondylosis and degenerative disc disease. In addition, a right eccentric intervertebral spur at T8-9 causes prominent central narrowing of the thecal sac. 3. Other imaging findings of potential clinical significance: Aortic Atherosclerosis (ICD10-I70.0). Systemic atherosclerosis. Small type 1 hiatal hernia.     08/30/2022 Imaging   1. Status post hysterectomy without evidence of local recurrence. 2. No evidence of metastatic disease within the abdomen or pelvis. 3. Moderate volume of formed stool in the colon. Correlate for constipation. 4. Moderate-sized hiatal hernia. 5.  Aortic Atherosclerosis (ICD10-I70.0).   08/29/2023 Imaging   CT ABDOMEN PELVIS W CONTRAST Result Date: 09/05/2023 CLINICAL DATA:  Follow-up endometrial carcinoma. Status post chemotherapy. Restaging. * Tracking Code: BO * EXAM: CT ABDOMEN AND PELVIS WITH CONTRAST TECHNIQUE: Multidetector CT imaging of the abdomen and pelvis was performed using the standard protocol following bolus administration of intravenous contrast. RADIATION DOSE REDUCTION: This exam was performed according to the departmental dose-optimization program which includes automated exposure control, adjustment of the mA and/or kV according to patient  size and/or use of iterative reconstruction technique. CONTRAST:  OMNIPAQUE  IOHEXOL  300 MG/ML  SOLN COMPARISON:  08/28/2022 FINDINGS: Lower Chest: No acute findings. Hepatobiliary: No suspicious hepatic masses identified. Gallbladder is unremarkable. No evidence of biliary ductal dilatation. Pancreas:  No mass or inflammatory changes. Spleen: Within normal limits in size and appearance. Adrenals/Urinary Tract: No suspicious masses identified. No evidence of ureteral calculi or hydronephrosis. Tiny amount of gas noted in urinary bladder, presumably from recent bladder catheterization/instrumentation. Stomach/Bowel: Moderate size hiatal hernia again seen. No evidence of obstruction, inflammatory process or abnormal fluid collections. Vascular/Lymphatic: No pathologically enlarged lymph nodes. No acute vascular findings. Reproductive: Prior  hysterectomy noted. No mass seen involving the vaginal cuff. Adnexal regions are unremarkable in appearance. Other:  None. Musculoskeletal:  No suspicious bone lesions identified. IMPRESSION: No evidence of recurrent or metastatic disease within the abdomen or pelvis. Stable moderate hiatal hernia. Electronically Signed   By: Norleen DELENA Kil M.D.   On: 09/05/2023 10:46   HM MAMMOGRAPHY Negative - [facility]      10/09/2023 Procedure   Successful removal of implanted Port-A-Cath.       Interval History: Overall doing okay.  Having some pain along her spine, unsure if this is related to her spine or to weight gain.  She is somewhat frustrated by continued weight gain.  Feels like urinary symptoms have not improved since I saw her, continues to have frequency and incontinence.  Also endorses some burning and pain when she urinates.    Endorses intermittent vulvar itching and feels like at times she has a bite or a sore.  Denies any symptoms currently.  Denies any vaginal bleeding or discharge.  Reports baseline bowel function.  Past Medical/Surgical History: Past  Medical History:  Diagnosis Date   Arthritis    Asthma    Diverticulosis    Dyspnea    with exertion    Endometrial hyperplasia    Esophageal stricture    Family history of breast cancer 01/26/2021   Foot pain    Fundic gland polyps of stomach, benign 07/10/2015   Gastritis    GERD (gastroesophageal reflux disease)    Hemorrhoid    Hiatal hernia    Hip pain    History of kidney stones    cystoscopy   History of surgery on arm    left humerus   Hyperlipidemia    Hypertension    Hypothyroidism    Knee pain    Metabolic syndrome    Obstructive sleep apnea 08/27/2017   pt denies    Oxygen deficiency    pt denies   PONV (postoperative nausea and vomiting)    Spinal headache    few headaches recently    Tendonitis of ankle    Vitamin D  deficiency     Past Surgical History:  Procedure Laterality Date   ANAL FISSURE REPAIR     CATARACT EXTRACTION  May 2009   COLONOSCOPY  12/26/07   ESOPHAGEAL DILATION     ESOPHAGOGASTRODUODENOSCOPY  12/26/07, 05/13/08   EYE SURGERY Bilateral    bilateral cataract with lens implants   IR IMAGING GUIDED PORT INSERTION  01/25/2021   IR REMOVAL TUN ACCESS W/ PORT W/O FL MOD SED  10/08/2023   KNEE CARTILAGE SURGERY Right Sept 1999   ORIF HUMERUS FRACTURE Left 01/12/2020   Procedure: OPEN REDUCTION INTERNAL FIXATION (ORIF) LEFT HUMERUS FRACTURE;  Surgeon: Camella Fallow, MD;  Location: [facility];  Service: Orthopedics;  Laterality: Left;   ROBOTIC ASSISTED TOTAL HYSTERECTOMY WITH BILATERAL SALPINGO OOPHERECTOMY Bilateral 12/29/2020   Procedure: XI ROBOTIC ASSISTED TOTAL HYSTERECTOMY WITH BILATERAL SALPINGO OOPHORECTOMY;  Surgeon: Eloy Herring, MD;  Location: [facility];  Service: Gynecology;  Laterality: Bilateral;   SENTINEL NODE BIOPSY N/A 12/29/2020   Procedure: SENTINEL NODE BIOPSY;  Surgeon: Eloy Herring, MD;  Location: [facility];  Service: Gynecology;  Laterality: N/A;   TOTAL KNEE ARTHROPLASTY Right 10/07/2017   Procedure: RIGHT TOTAL KNEE ARTHROPLASTY;   Surgeon: Melodi Lerner, MD;  Location: [facility];  Service: Orthopedics;  Laterality: Right;  Adductor Block   UMBILICAL HERNIA REPAIR  05/20/06    Family History  Problem Relation Age of Onset  Colon cancer Mother 59   Heart attack Father    Heart disease Sister    Breast cancer Sister 59   Breast cancer Sister        dx 38s   Cancer Maternal Aunt 1       female cancer, unknown type   Cancer Maternal Grandmother        unknown type; dx 55s   Cancer Maternal Grandfather        unknown type; dx 56s   Cancer Cousin        maternal female cousin; dx 63s; unknown type   Cancer Cousin        maternal female cousin; dx 25s; unknown type   Cancer Cousin        maternal female cousin; dx after 66; unknown type   Esophageal cancer Neg Hx    Stomach cancer Neg Hx    Rectal cancer Neg Hx     Social History   Socioeconomic History   Marital status: Married    Spouse name: Debby   Number of children: 2   Years of education: 14   Highest education level: Associate degree: academic program  Occupational History   Occupation: Retired  Tobacco Use   Smoking status: Never   Smokeless tobacco: Never  Vaping Use   Vaping status: Never Used  Substance and Sexual Activity   Alcohol use: No   Drug use: No   Sexual activity: Not Currently    Birth control/protection: Post-menopausal  Other Topics Concern   Not on file  Social History Narrative   Married and retired   No EtOH, tobacco or drug use   Social Drivers of Corporate investment banker Strain: Low Risk  (05/23/2023)   Overall Financial Resource Strain (CARDIA)    Difficulty of Paying Living Expenses: Not hard at all  Food Insecurity: No Food Insecurity (05/23/2023)   Hunger Vital Sign    Worried About Running Out of Food in the Last Year: Never true    Ran Out of Food in the Last Year: Never true  Transportation Needs: No Transportation Needs (05/23/2023)   PRAPARE - Administrator, Civil Service  (Medical): No    Lack of Transportation (Non-Medical): No  Physical Activity: Inactive (05/23/2023)   Exercise Vital Sign    Days of Exercise per Week: 0 days    Minutes of Exercise per Session: 0 min  Stress: No Stress Concern Present (05/23/2023)   [employer] of Occupational Health - Occupational Stress Questionnaire    Feeling of Stress : Not at all  Social Connections: Socially Integrated (05/23/2023)   Social Connection and Isolation Panel    Frequency of Communication with Friends and Family: More than three times a week    Frequency of Social Gatherings with Friends and Family: More than three times a week    Attends Religious Services: More than 4 times per year    Active Member of [employer] or Organizations: Yes    Attends Engineer, structural: More than 4 times per year    Marital Status: Married    Current Medications:  Current Outpatient Medications:    acetaminophen  (TYLENOL ) 500 MG tablet, Take 1,000 mg by mouth as needed (pain.)., Disp: , Rfl:    albuterol  (VENTOLIN  HFA) 108 (90 Base) MCG/ACT inhaler, Inhale 2 puffs into the lungs every 6 (six) hours as needed for wheezing or shortness of breath., Disp: 6.7 g, Rfl: 1   benzonatate  (TESSALON  PERLES) 100  MG capsule, Take 1 capsule (100 mg total) by mouth 3 (three) times daily as needed for cough., Disp: 20 capsule, Rfl: 0   budesonide -formoterol  (SYMBICORT ) 80-4.5 MCG/ACT inhaler, Inhale 2 puffs into the lungs 2 (two) times daily., Disp: 3 each, Rfl: 11   busPIRone  (BUSPAR ) 7.5 MG tablet, Take 1 tablet (7.5 mg total) by mouth 2 (two) times daily., Disp: 180 tablet, Rfl: 3   Calcium  Carbonate-Vit D-Min (CALCIUM  1200 PO), Take 1 tablet by mouth in the morning., Disp: , Rfl:    Cholecalciferol  (VITAMIN D3) 2000 units TABS, Take 2,000 Units by mouth in the morning., Disp: , Rfl:    clobetasol  ointment (TEMOVATE ) 0.05 %, Apply 1 Application topically 3 (three) times a week. Apply cream to the area where you have had  itching 3 times a week at night., Disp: 30 g, Rfl: 0   DULoxetine  (CYMBALTA ) 30 MG capsule, Take 2 capsules by mouth once daily, Disp: 180 capsule, Rfl: 1   estradiol  (ESTRACE ) 0.1 MG/GM vaginal cream, Place 0.5-1g nightly for 2 weeks, then twice a week after, Disp: 30 g, Rfl: 3   furosemide  (LASIX ) 20 MG tablet, TAKE 1 TABLET BY MOUTH DAILY AS  NEEDED FOR FLUID, Disp: 100 tablet, Rfl: 0   levothyroxine  (SYNTHROID ) 75 MCG tablet, Take 1 tablet (75 mcg total) by mouth daily before breakfast., Disp: 90 tablet, Rfl: 3   losartan -hydrochlorothiazide  (HYZAAR) 100-12.5 MG tablet, Take 1 tablet by mouth daily., Disp: 100 tablet, Rfl: 0   Omega-3 Fatty Acids (FISH OIL) 1000 MG CAPS, Take 1,000 mg by mouth in the morning., Disp: , Rfl:    pantoprazole  (PROTONIX ) 40 MG tablet, Take 1 tablet (40 mg total) by mouth 2 (two) times daily before a meal., Disp: 200 tablet, Rfl: 3   Trospium  Chloride 60 MG CP24, Take 1 capsule (60 mg total) by mouth daily., Disp: 30 capsule, Rfl: 2   valACYclovir  (VALTREX ) 1000 MG tablet, TAKE 1 TABLET BY MOUTH TWICE  DAILY FOR 7 DAYS; IF HAVING  ANOTHER FLARE, TAKE 2 TABLETS  TWICE DAILY FOR 1 DAY ONLY PER  FLAREUP, Disp: 100 tablet, Rfl: 0   Vibegron  (GEMTESA ) 75 MG TABS, Take 1 tablet (75 mg total) by mouth daily., Disp: , Rfl:    vitamin B-12 (CYANOCOBALAMIN) 1000 MCG tablet, Take 1,000 mcg by mouth in the morning., Disp: , Rfl:    HYDROcodone  bit-homatropine (HYCODAN) 5-1.5 MG/5ML syrup, Take 2.5 mLs by mouth every 6 (six) hours as needed for cough. (Patient not taking: Reported on 02/13/2024), Disp: 120 mL, Rfl: 0   lidocaine  (XYLOCAINE ) 5 % ointment, Apply 1 Application topically as needed. Up to 3 times a day (Patient not taking: Reported on 02/13/2024), Disp: 35.44 g, Rfl: 0   Vibegron  (GEMTESA ) 75 MG TABS, Take 1 tablet (75 mg total) by mouth daily., Disp: 7 tablet, Rfl:   Review of Systems: + frequency, dysuria, back pain, itching Denies appetite changes, fevers, chills,  fatigue, unexplained weight changes. Denies hearing loss, neck lumps or masses, mouth sores, ringing in ears or voice changes. Denies cough or wheezing.  Denies shortness of breath. Denies chest pain or palpitations. Denies leg swelling. Denies abdominal distention, pain, blood in stools, constipation, diarrhea, nausea, vomiting, or early satiety. Denies pain with intercourse, dysuria, hematuria. Denies hot flashes, pelvic pain, vaginal bleeding or vaginal discharge.   Denies joint pain or muscle pain/cramps. Denies rash, or wounds. Denies dizziness, headaches, numbness or seizures. Denies swollen lymph nodes or glands, denies easy bruising or bleeding. Denies  anxiety, depression, confusion, or decreased concentration.  Physical Exam: BP 114/69 (BP Location: Right Arm, Patient Position: Sitting)   Pulse 73   Temp 98 F (36.7 C) (Oral)   Resp 19   Wt 250 lb (113.4 kg)   SpO2 98%   BMI 50.07 kg/m  General: Alert, oriented, no acute distress. HEENT: Normocephalic, atraumatic, sclera anicteric. Some white patches on her tongue. Chest: Clear to auscultation bilaterally.  No wheezes or rhonchi. Cardiovascular: Regular rate and rhythm, no murmurs. Abdomen: Obese, soft, nontender.  Normoactive bowel sounds.  No masses or hepatosplenomegaly appreciated.  Well-healed incisions. Extremities: Grossly normal range of motion.  Warm, well perfused.  No edema bilaterally. Skin: No rashes or lesions noted. Lymphatics: No cervical, supraclavicular, or inguinal adenopathy. GU: Normal appearing external genitalia without erythema, excoriation, or lesions.  No visible lesions along the left labia majora.  Speculum exam reveals moderately atrophic vaginal mucosa, no lesions or masses, no bleeding or discharge noted.  Laxity of the vaginal wall noted.  Bimanual exam reveals cuff and vaginal mucosa smooth, no nodularity.   Laboratory & Radiologic Studies: None new  Assessment & Plan: Stacie Russell  is a 73 y.o. 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  Completed adjuvant chemotherapy in 05/2021.  Posttreatment imaging shows no evidence of disease. Imaging 08/29/23 negative for recurrent disease.   Patient is overall doing well and is NED on exam today.  No vulvar lesions appreciated.  I have asked the patient to call if she develops symptoms again so that she can come in and see Melissa while having symptoms.   She is seeing Dr. Guadlupe now for her urinary symptoms.  Plan to send urinalysis today given urinary symptoms.   Per NCCN surveillance recommendations, we will see her back in 6 months for surveillance visit.  Discussed signs and symptoms that would be concerning for cancer recurrence and stressed the importance of calling if she develops any of these.    22 minutes of total time was spent for this patient encounter, including preparation, face-to-face counseling with the patient and coordination of care, and documentation of the encounter.  Comer Dollar, MD  Division of Gynecologic Oncology  Department of Obstetrics and Gynecology  [facility]

## 2024-02-21 DIAGNOSIS — Z8542 Personal history of malignant neoplasm of other parts of uterus: Secondary | ICD-10-CM | POA: Diagnosis not present

## 2024-02-22 ENCOUNTER — Ambulatory Visit: Payer: Self-pay | Admitting: Gynecologic Oncology

## 2024-02-23 ENCOUNTER — Other Ambulatory Visit: Payer: Self-pay | Admitting: Gynecologic Oncology

## 2024-02-23 DIAGNOSIS — R399 Unspecified symptoms and signs involving the genitourinary system: Secondary | ICD-10-CM

## 2024-02-23 LAB — URINE CULTURE: Culture: 30000 — AB

## 2024-02-23 MED ORDER — NITROFURANTOIN MONOHYD MACRO 100 MG PO CAPS: 100.0000 mg | ORAL_CAPSULE | Freq: Two times a day (BID) | ORAL | 0 refills | Status: AC

## 2024-02-24 ENCOUNTER — Telehealth: Payer: Self-pay | Admitting: *Deleted

## 2024-02-24 NOTE — Telephone Encounter (Signed)
 Attempted to reach patient to relay MyChart message from provider. Left voicemail requesting call back to [phone].

## 2024-02-25 NOTE — Telephone Encounter (Signed)
 2nd attempt to reach patient to relay results and message from Dr. Viktoria in [place]. Left voicemail requesting call back to [phone].

## 2024-02-25 NOTE — Telephone Encounter (Signed)
 3rd attempt to reach patient. Left voicemail requesting call back to [phone].

## 2024-02-26 NOTE — Telephone Encounter (Signed)
 Stacie Russell returning call from [name]. She is sorry for not being available. Reports she had a stroke and has been away from home. She can be reached at [phone]

## 2024-02-26 NOTE — Telephone Encounter (Signed)
 Spoke with [name] to relay message from Dr. Viktoria through patient's MyChart. Patient states she hasn't been home because her sister had a stroke and she was caring for her and she had a problem getting onto MyChart. Patient was sent a new link to reset her password.  Relayed MyChart message that her culture shows a small infection. Dr. Viktoria typically wouldn't  treat someone with this low number of bacteria, but since you have been having symptoms, I think we should treat it. An antibiotic has been sent in to your pharmacy.  [name] states she has already picked up the antibiotic and started taking it. Pt reports she is still having the frequency (states this is her baseline) but the burning on urination has improved. Pt denies fever, chills and or flank pain. Advised patient to call the office if her symptoms return, accompanied by other symptoms, concerns or questions. Pt  verbalized understanding and thanked the office for being persistent in reaching her.

## 2024-02-27 ENCOUNTER — Encounter: Payer: Self-pay | Admitting: Obstetrics

## 2024-02-27 ENCOUNTER — Ambulatory Visit: Admitting: Obstetrics

## 2024-02-27 VITALS — BP 113/76 | HR 80

## 2024-02-27 DIAGNOSIS — N3946 Mixed incontinence: Secondary | ICD-10-CM | POA: Diagnosis not present

## 2024-02-27 DIAGNOSIS — N952 Postmenopausal atrophic vaginitis: Secondary | ICD-10-CM

## 2024-02-27 DIAGNOSIS — E66813 Obesity, class 3: Secondary | ICD-10-CM

## 2024-02-27 MED ORDER — TROSPIUM CHLORIDE ER 60 MG PO CP24: 1.0000 | ORAL_CAPSULE | Freq: Every day | ORAL | 6 refills | Status: DC

## 2024-02-27 NOTE — Progress Notes (Signed)
 [facility] Return Visit  SUBJECTIVE  History of Present Illness: Stacie Russell is a 73 y.o. female who returns for follow-up of mixed urinary incontinence, vaginal atrophy, vulvar pain, BMI, and constipation.   S/p urethral bulking and bladder botox  injection 09/05/23.  Trospium  ran out last Monday. UUI managed by 4-5 pads/day on Trospium , now back to 5-6 pads/day around 36 hours after discontinuation of Trospium   Uses 3-4 pads/day on Trospium  with Gemtesa , however reports heavier pad weights   Baseline 5-6 pads per day.   Cutout coffee Leaks continuously per patient, urgency > stress Patient is bothered by UI symptoms. Referral sent to medical weight management, not covered by insurance per pt. Desires to proceed with mediterranean diet.    Day time voids 10-12.  Nocturia: 0-2 down from 2-4x/night to void  Using estradiol  every 3 days Denies vaginal burning or discharge  Tried Kegels, pelvic floor PT with some relief however discontinued due to cost and left sided PFM tension on exam, PTNS (no relief), and Toviaz (relief but cost prohibitive), Ditropan  with relief, mirabegron  medications with some relief, and gemtesa  alone with no relief.  Reports loose stool attributed to diet around the holidays, denies FI. Denies fiber supplementation. Bowel movements improved since stopping oxybutynin .   Past Medical History: Patient  has a past medical history of Arthritis, Asthma, Diverticulosis, Dyspnea, Endometrial hyperplasia, Esophageal stricture, Family history of breast cancer (01/26/2021), Foot pain, Fundic gland polyps of stomach, benign (07/10/2015), Gastritis, GERD (gastroesophageal reflux disease), Hemorrhoid, Hiatal hernia, Hip pain, History of kidney stones, History of surgery on arm, Hyperlipidemia, Hypertension, Hypothyroidism, Knee pain, Metabolic syndrome, Obstructive sleep apnea (08/27/2017), Oxygen deficiency, PONV (postoperative nausea and vomiting), Spinal headache,  Tendonitis of ankle, and Vitamin D  deficiency.   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   Medications: She has a current medication list which includes the following prescription(s): acetaminophen , albuterol , benzonatate , budesonide -formoterol , buspirone , calcium  carbonate-vit d-min, vitamin d3, clobetasol  ointment, duloxetine , estradiol , furosemide , hydrocodone  bit-homatropine, levothyroxine , lidocaine , losartan -hydrochlorothiazide , nitrofurantoin  (macrocrystal-monohydrate), fish oil, pantoprazole , valacyclovir , cyanocobalamin, and trospium  chloride.   Allergies: Patient is allergic to tramadol .   Social History: Patient  reports that she has never smoked. She has never used smokeless tobacco. She reports that she does not drink alcohol and does not use drugs.     OBJECTIVE     Physical Exam: Vitals:   02/27/24 1402  BP: 113/76  Pulse: 80     ASSESSMENT AND PLAN    Stacie Russell is a 73 y.o. with:  1. Obesity, Class III, BMI 40-49.9 (morbid obesity)   2. Mixed stress and urge urinary incontinence   3. Vaginal atrophy    Obesity, Class III, BMI 40-49.9 (morbid obesity) Assessment & Plan: - discussed association with pelvic floor disorders - reviewed 5-8% weight reduction with improvement of urinary symptoms - encouraged to resume exercises classes she previously attended with  improvement of overall wellbeing - referral to medical weight mgmt not covered by insurance - reports prior discussion with PCP with GLP-1 not covered - encouraged to start Mediterranean diet  Mixed stress and urge urinary incontinence Assessment & Plan: - s/p urethral bulking and bladder botox  injection 09/05/23 with reduction of SUI but worsening UUI around 2 wks after procedure - catheterized for , prior repeat [facility] teaching however pt with limited ability to visualize urethral meatus due to habitus - improved after Trospium  60mg  XL to [facility] due to cost, reports desires for additional relief due to one day of increased symptomatic relief with trospium  2x/day - samples of Gemtesa  for trial of 2 medications to reassess symptoms, pt denies relief - mirabegron  cost prohibitive - prior catheterization UA to r/o UTI, Rx macrobid  for presumed UTI changed to Bactrim  with resolution of UTI symptoms. Repeat catheterized urine culture 12/27/23 no growth.  - prior PVR 50mL, repeat [facility] x 2 teaching however patient unable to perform. Encouraged pt to have husband present to follow-up for teaching if symptoms worsens - pt to request ROI from Dr. Maynard office - possible prior PTNS, failed oxybutynin , toviaz (cost prohibitive), mirabegron . Gemtesa  ($100) alone with no relief.  - We discussed the symptoms of overactive bladder (OAB), which include urinary urgency, urinary frequency, nocturia, with or without urge incontinence.  While we do not know the exact etiology of OAB, several treatment options exist. We discussed management including behavioral therapy (decreasing bladder irritants, urge suppression strategies, timed voids, bladder retraining), physical therapy, medication; for refractory cases posterior tibial nerve stimulation, sacral neuromodulation, and intravesical botulinum toxin injection.  - reviewed treatment options, may consider SNM if refractory symptoms or repeat botox  at 200U with  handout provided and reviewed. Patient dislike idea of repeated botox  injections needed and desires to proceed with PNE. Reviewed risks and benefit of PNE and positioning required, discussed post-procedural expectation and no bathing. Provided bladder diary to repeat baseline prior to PNE.  - UDS 07/17/23 with increased sensation, decreased capacity at , SUI at and DO. Pressure flow with normal PVR, normal detrusor activity and synergic EMG.  - We discussed the role of sacral neuromodulation and how it works. It requires a test phase, and documentation of bladder function via diary. After a successful test period, a permanent wire and generator are placed in the OR. The battery lasts 5 years on average and would need to be replaced surgically.  The goal of this therapy is at least a 50% improvement in symptoms. It is NOT realistic to expect a 100% cure.  We reviewed the fact that about 30% of patients fail the test phase and are not candidates for permanent generator placement.  We discussed the risk of infection and that the patient would not be able to get an MRI once the device is placed. There are two companies that provide this therapy: Medtronic and [employer]. [employer]' product is new and is similar to Medtronic's, but has advantages of a smaller and rechargeable battery and being able to have an MRI with the implant. For all procedures, we discussed risks of bleeding, infection, damage to surrounding organs including bowel, bladder, blood vessels, ureters and nerves, need for further surgery, risk of postoperative urinary incontinence or retention with need to catheterize, recurrent prolapse, numbness and weakness at any body site, buttock pain, and the rarer risks of blood clot, heart attack, pneumonia, death.   - discussed weight reduction, referral to medical weight mgmt not covered by insurance. Encouraged mediterranean diet   - For treatment of stress urinary incontinence,  non-surgical  options include expectant management, weight loss, physical therapy, as well as a pessary.  Surgical options include  a midurethral sling, Burch urethropexy, and transurethral injection of a bulking agent. - denies relief with urethral bulking 09/05/23. Reassess after PNE  Orders: -     Trospium  Chloride ER; Take 1 capsule (60 mg total) by mouth daily.  Dispense: 30 capsule; Refill: 6  Vaginal atrophy Assessment & Plan: - For symptomatic vaginal atrophy options include lubrication with a water -based lubricant, personal hygiene measures and barrier protection against wetness, and estrogen replacement in the form of vaginal cream, vaginal tablets, or a time-released vaginal ring.   - continue low dose vaginal estrogen 2x/week   Time spent: I spent 37 minutes dedicated to the care of this patient on the date of this encounter to include pre-visit review of records, face-to-face time with the patient discussing mixed urinary incontinence, BMI, vaginal atrophy, and post visit documentation and ordering medication/referral.   Lianne ONEIDA Gillis, MD

## 2024-02-27 NOTE — Patient Instructions (Addendum)
 Continue Trospium  60mg  1 tab/day.   Consider nerve assessment test   Please call if you experience any change in urinary or vaginal symptoms.   Consider fiber supplementation for stool consistency  Women should try to eat at least 21 to 25 grams of fiber a day, while men should aim for 30 to 38 grams a day. You can add fiber to your diet with food or a fiber supplement such as psyllium (metamucil), benefiber, or fibercon.   Here's a look at how much dietary fiber is found in some common foods. When buying packaged foods, check the Nutrition Facts label for fiber content. It can vary among brands.  Fruits Serving size Total fiber (grams)*  Raspberries 1 cup 8.0  Pear 1 medium 5.5  Apple, with skin 1 medium 4.5  Banana 1 medium 3.0  Orange 1 medium 3.0  Strawberries 1 cup 3.0   Vegetables Serving size Total fiber (grams)*  Green peas, boiled 1 cup 9.0  Broccoli, boiled 1 cup chopped 5.0  Turnip greens, boiled 1 cup 5.0  Brussels sprouts, boiled 1 cup 4.0  Potato, with skin, baked 1 medium 4.0  Sweet corn, boiled 1 cup 3.5  Cauliflower, raw 1 cup chopped 2.0  Carrot, raw 1 medium 1.5   Grains Serving size Total fiber (grams)*  Spaghetti, whole-wheat, cooked 1 cup 6.0  Barley, pearled, cooked 1 cup 6.0  Bran flakes 3/4 cup 5.5  Quinoa, cooked 1 cup 5.0  Oat bran muffin 1 medium 5.0  Oatmeal, instant, cooked 1 cup 5.0  Popcorn, air-popped 3 cups 3.5  Brown rice, cooked 1 cup 3.5  Bread, whole-wheat 1 slice 2.0  Bread, rye 1 slice 2.0   Legumes, nuts and seeds Serving size Total fiber (grams)*  Split peas, boiled 1 cup 16.0  Lentils, boiled 1 cup 15.5  Black beans, boiled 1 cup 15.0  Baked beans, canned 1 cup 10.0  Chia seeds 1 ounce 10.0  Almonds 1 ounce (23 nuts) 3.5  Pistachios 1 ounce (49 nuts) 3.0  Sunflower kernels 1 ounce 3.0  *Rounded to nearest 0.5 gram. Source: [employer] for [employer]	[place]

## 2024-02-27 NOTE — Assessment & Plan Note (Signed)
-   For symptomatic vaginal atrophy options include lubrication with a water -based lubricant, personal hygiene measures and barrier protection against wetness, and estrogen replacement in the form of vaginal cream, vaginal tablets, or a time-released vaginal ring.   - continue low dose vaginal estrogen 2x/week

## 2024-02-27 NOTE — Assessment & Plan Note (Signed)
 - s/p urethral bulking and bladder botox  injection 09/05/23 with reduction of SUI but worsening UUI around 2 wks after procedure - catheterized for , prior repeat [facility] teaching however pt with limited ability to visualize urethral meatus due to habitus - improved after Trospium  60mg  XL to [facility] due to cost, reports desires for additional relief due to one day of increased symptomatic relief with trospium  2x/day - samples of Gemtesa  for trial of 2 medications to reassess symptoms, pt denies relief - mirabegron  cost prohibitive - prior catheterization UA to r/o UTI, Rx macrobid  for presumed UTI changed to Bactrim  with resolution of UTI symptoms. Repeat catheterized urine culture 12/27/23 no growth.  - prior PVR 50mL, repeat [facility] x 2 teaching however patient unable to perform. Encouraged pt to have husband present to follow-up for teaching if symptoms worsens - pt to request ROI from Dr. Maynard office - possible prior PTNS, failed oxybutynin , toviaz (cost prohibitive), mirabegron . Gemtesa  ($100) alone with no relief.  - We discussed the symptoms of overactive bladder (OAB), which include urinary urgency, urinary frequency, nocturia, with or without urge incontinence.  While we do not know the exact etiology of OAB, several treatment options exist. We discussed management including behavioral therapy (decreasing bladder irritants, urge suppression strategies, timed voids, bladder retraining), physical therapy, medication; for refractory cases posterior tibial nerve stimulation, sacral neuromodulation, and intravesical botulinum toxin injection.  - reviewed treatment options, may consider SNM if refractory symptoms or repeat botox  at 200U with handout provided and reviewed. Patient dislike idea of repeated botox  injections needed and desires to proceed with PNE. Reviewed risks and benefit of PNE and positioning required, discussed post-procedural expectation and no bathing. Provided bladder diary to repeat  baseline prior to PNE.  - UDS 07/17/23 with increased sensation, decreased capacity at , SUI at and DO. Pressure flow with normal PVR, normal detrusor activity and synergic EMG.  - We discussed the role of sacral neuromodulation and how it works. It requires a test phase, and documentation of bladder function via diary. After a successful test period, a permanent wire and generator are placed in the OR. The battery lasts 5 years on average and would need to be replaced surgically.  The goal of this therapy is at least a 50% improvement in symptoms. It is NOT realistic to expect a 100% cure.  We reviewed the fact that about 30% of patients fail the test phase and are not candidates for permanent generator placement.  We discussed the risk of infection and that the patient would not be able to get an MRI once the device is placed. There are two companies that provide this therapy: [employer] and [employer]. [employer]' product is new and is similar to Med[employer]'s, but has advantages of a smaller and rechargeable battery and being able to have an MRI with the implant. For all procedures, we discussed risks of bleeding, infection, damage to surrounding organs including bowel, bladder, blood vessels, ureters and nerves, need for further surgery, risk of postoperative urinary incontinence or retention with need to catheterize, recurrent prolapse, numbness and weakness at any body site, buttock pain, and the rarer risks of blood clot, heart attack, pneumonia, death.   - discussed weight reduction, referral to medical weight mgmt not covered by insurance. Encouraged mediterranean diet   - For treatment of stress urinary incontinence,  non-surgical options include expectant management, weight loss, physical therapy, as well as a pessary.  Surgical options include a midurethral sling, Burch urethropexy, and transurethral injection of a  bulking agent. - denies relief with urethral bulking 09/05/23. Reassess after PNE

## 2024-02-27 NOTE — Assessment & Plan Note (Signed)
-   discussed association with pelvic floor disorders - reviewed 5-8% weight reduction with improvement of urinary symptoms - encouraged to resume exercises classes she previously attended with improvement of overall wellbeing - referral to medical weight mgmt not covered by insurance - reports prior discussion with PCP with GLP-1 not covered - encouraged to start Mediterranean diet

## 2024-03-06 ENCOUNTER — Telehealth: Payer: Self-pay | Admitting: *Deleted

## 2024-03-06 DIAGNOSIS — N3946 Mixed incontinence: Secondary | ICD-10-CM

## 2024-03-06 MED ORDER — TROSPIUM CHLORIDE ER 60 MG PO CP24
1.0000 | ORAL_CAPSULE | Freq: Every day | ORAL | 6 refills | Status: AC
Start: 2024-03-06 — End: ?

## 2024-03-06 NOTE — Telephone Encounter (Signed)
 TC from pt needs trospium  sent to [facility] and not [facility].  Rx sent [facility] CMA

## 2024-03-25 ENCOUNTER — Ambulatory Visit: Payer: HMO | Admitting: Family Medicine

## 2024-03-25 ENCOUNTER — Encounter: Payer: Self-pay | Admitting: Family Medicine

## 2024-03-25 VITALS — BP 134/75 | HR 91 | Temp 97.9°F | Ht 61.0 in | Wt 252.2 lb

## 2024-03-25 DIAGNOSIS — L821 Other seborrheic keratosis: Secondary | ICD-10-CM | POA: Diagnosis not present

## 2024-03-25 DIAGNOSIS — Z23 Encounter for immunization: Secondary | ICD-10-CM

## 2024-03-25 DIAGNOSIS — E66813 Obesity, class 3: Secondary | ICD-10-CM

## 2024-03-25 DIAGNOSIS — N898 Other specified noninflammatory disorders of vagina: Secondary | ICD-10-CM | POA: Diagnosis not present

## 2024-03-25 DIAGNOSIS — R7303 Prediabetes: Secondary | ICD-10-CM

## 2024-03-25 DIAGNOSIS — G4733 Obstructive sleep apnea (adult) (pediatric): Secondary | ICD-10-CM | POA: Diagnosis not present

## 2024-03-25 DIAGNOSIS — I83893 Varicose veins of bilateral lower extremities with other complications: Secondary | ICD-10-CM

## 2024-03-25 DIAGNOSIS — R0781 Pleurodynia: Secondary | ICD-10-CM

## 2024-03-25 DIAGNOSIS — E039 Hypothyroidism, unspecified: Secondary | ICD-10-CM | POA: Diagnosis not present

## 2024-03-25 MED ORDER — CLOTRIMAZOLE-BETAMETHASONE 1-0.05 % EX CREA: 1.0000 | TOPICAL_CREAM | Freq: Two times a day (BID) | CUTANEOUS | 0 refills | Status: AC | PRN

## 2024-03-25 MED ORDER — ZEPBOUND 2.5 MG/0.5ML ~~LOC~~ SOAJ: 2.5000 mg | SUBCUTANEOUS | 0 refills | Status: DC

## 2024-03-25 MED ORDER — ZEPBOUND 7.5 MG/0.5ML ~~LOC~~ SOAJ: 7.5000 mg | SUBCUTANEOUS | 0 refills | Status: DC

## 2024-03-25 MED ORDER — ZEPBOUND 5 MG/0.5ML ~~LOC~~ SOAJ: 5.0000 mg | SUBCUTANEOUS | 0 refills | Status: DC

## 2024-03-25 NOTE — Progress Notes (Signed)
 Subjective: CC: multiple concerns today PCP: Jolinda Norene HERO, DO YEP:[id number] HERO Dishner is a 73 y.o. female presenting to clinic today for:  1. Rib pain, back pain Reports that she has been having some increasing frequency of back pain and neck pain.  She is also been having some left rib pain with bending in certain positions.  It only occurs sometimes but she wanted to make note of this.  No palpable masses.  No changes in appetite.  No unplanned weight loss.  No reports of blood in stool.  She continues to follow-up regularly with her cancer doctor for history of endometrial cancer.  2.  Morbid obesity associated with obstructive sleep apnea Diagnosed with moderate obstructive sleep apnea on her January 2025 home sleep study.  She is considering oral appliance for treatment.  She reports waxing and waning energy levels and that she sometimes gives out easily.  She admits that she is mostly sedentary due to the back pain as above.  She has tried to exercise in the past but has difficulty due to pain and mobility issues.  She has tried diet restriction in the past but has not been able to maintain.  3.  Vaginal lesion She reports a pigmented skin lesion on the vaginal lip that has been present for at least a year and has been itchy and sometimes burns.  She has had her OB/GYN and her urologist look in this area but they can never see anything.  She took a picture of it and brings it in today.  4.  Puffy ankles She reports that her left foot specifically seems to get really puffy at the end of the day though both feet do swell.  She again does not exercise much due to pain issues as above.  She is not necessarily salt restrictive.  She does have varicose veins in her ankles.  Wondering what she should do  5.  Skin lesion She reports a skin lesion on the right cheek that she would like to know about.  She thinks it is getting a little darker.     ROS: Per HPI  Allergies  Allergen  Reactions   Tramadol  Other (See Comments)    Unsure of reaction   Past Medical History:  Diagnosis Date   Arthritis    Asthma    Diverticulosis    Dyspnea    with exertion    Endometrial hyperplasia    Esophageal stricture    Family history of breast cancer 01/26/2021   Foot pain    Fundic gland polyps of stomach, benign 07/10/2015   Gastritis    GERD (gastroesophageal reflux disease)    Hemorrhoid    Hiatal hernia    Hip pain    History of kidney stones    cystoscopy   History of surgery on arm    left humerus   Hyperlipidemia    Hypertension    Hypothyroidism    Knee pain    Metabolic syndrome    Obstructive sleep apnea 08/27/2017   pt denies    Oxygen deficiency    pt denies   PONV (postoperative nausea and vomiting)    Spinal headache    few headaches recently    Tendonitis of ankle    Vitamin D  deficiency     Current Outpatient Medications:    acetaminophen  (TYLENOL ) 500 MG tablet, Take 1,000 mg by mouth as needed (pain.)., Disp: , Rfl:    albuterol  (VENTOLIN  HFA) 108 (90 Base) MCG/ACT  inhaler, Inhale 2 puffs into the lungs every 6 (six) hours as needed for wheezing or shortness of breath., Disp: 6.7 g, Rfl: 1   benzonatate  (TESSALON  PERLES) 100 MG capsule, Take 1 capsule (100 mg total) by mouth 3 (three) times daily as needed for cough., Disp: 20 capsule, Rfl: 0   budesonide -formoterol  (SYMBICORT ) 80-4.5 MCG/ACT inhaler, Inhale 2 puffs into the lungs 2 (two) times daily., Disp: 3 each, Rfl: 11   busPIRone  (BUSPAR ) 7.5 MG tablet, Take 1 tablet (7.5 mg total) by mouth 2 (two) times daily., Disp: 180 tablet, Rfl: 3   Calcium  Carbonate-Vit D-Min (CALCIUM  1200 PO), Take 1 tablet by mouth in the morning., Disp: , Rfl:    Cholecalciferol  (VITAMIN D3) 2000 units TABS, Take 2,000 Units by mouth in the morning., Disp: , Rfl:    clobetasol  ointment (TEMOVATE ) 0.05 %, Apply 1 Application topically 3 (three) times a week. Apply cream to the area where you have had itching 3  times a week at night., Disp: 30 g, Rfl: 0   DULoxetine  (CYMBALTA ) 30 MG capsule, Take 2 capsules by mouth once daily, Disp: 180 capsule, Rfl: 1   estradiol  (ESTRACE ) 0.1 MG/GM vaginal cream, Place 0.5-1g nightly for 2 weeks, then twice a week after, Disp: 30 g, Rfl: 3   furosemide  (LASIX ) 20 MG tablet, TAKE 1 TABLET BY MOUTH DAILY AS  NEEDED FOR FLUID, Disp: 100 tablet, Rfl: 0   HYDROcodone  bit-homatropine (HYCODAN) 5-1.5 MG/5ML syrup, Take 2.5 mLs by mouth every 6 (six) hours as needed for cough., Disp: 120 mL, Rfl: 0   levothyroxine  (SYNTHROID ) 75 MCG tablet, Take 1 tablet (75 mcg total) by mouth daily before breakfast., Disp: 90 tablet, Rfl: 3   lidocaine  (XYLOCAINE ) 5 % ointment, Apply 1 Application topically as needed. Up to 3 times a day, Disp: 35.44 g, Rfl: 0   losartan -hydrochlorothiazide  (HYZAAR) 100-12.5 MG tablet, Take 1 tablet by mouth daily., Disp: 100 tablet, Rfl: 0   Omega-3 Fatty Acids (FISH OIL) 1000 MG CAPS, Take 1,000 mg by mouth in the morning., Disp: , Rfl:    pantoprazole  (PROTONIX ) 40 MG tablet, Take 1 tablet (40 mg total) by mouth 2 (two) times daily before a meal., Disp: 200 tablet, Rfl: 3   Trospium  Chloride 60 MG CP24, Take 1 capsule (60 mg total) by mouth daily., Disp: 30 capsule, Rfl: 6   valACYclovir  (VALTREX ) 1000 MG tablet, TAKE 1 TABLET BY MOUTH TWICE  DAILY FOR 7 DAYS; IF HAVING  ANOTHER FLARE, TAKE 2 TABLETS  TWICE DAILY FOR 1 DAY ONLY PER  FLAREUP, Disp: 100 tablet, Rfl: 0   vitamin B-12 (CYANOCOBALAMIN) 1000 MCG tablet, Take 1,000 mcg by mouth in the morning., Disp: , Rfl:  Social History   Socioeconomic History   Marital status: Married    Spouse name: Debby   Number of children: 2   Years of education: 14   Highest education level: Associate degree: academic program  Occupational History   Occupation: Retired  Tobacco Use   Smoking status: Never   Smokeless tobacco: Never  Vaping Use   Vaping status: Never Used  Substance and Sexual Activity    Alcohol use: No   Drug use: No   Sexual activity: Not Currently    Birth control/protection: Post-menopausal  Other Topics Concern   Not on file  Social History Narrative   Married and retired   No EtOH, tobacco or drug use   Social Drivers of Corporate investment banker Strain: Low Risk  (  05/23/2023)   Overall Financial Resource Strain (CARDIA)    Difficulty of Paying Living Expenses: Not hard at all  Food Insecurity: No Food Insecurity (05/23/2023)   Hunger Vital Sign    Worried About Running Out of Food in the Last Year: Never true    Ran Out of Food in the Last Year: Never true  Transportation Needs: No Transportation Needs (05/23/2023)   PRAPARE - Administrator, Civil Service (Medical): No    Lack of Transportation (Non-Medical): No  Physical Activity: Inactive (05/23/2023)   Exercise Vital Sign    Days of Exercise per Week: 0 days    Minutes of Exercise per Session: 0 min  Stress: No Stress Concern Present (05/23/2023)   [employer] of Occupational Health - Occupational Stress Questionnaire    Feeling of Stress : Not at all  Social Connections: Socially Integrated (05/23/2023)   Social Connection and Isolation Panel    Frequency of Communication with Friends and Family: More than three times a week    Frequency of Social Gatherings with Friends and Family: More than three times a week    Attends Religious Services: More than 4 times per year    Active Member of [employer] or Organizations: Yes    Attends Engineer, structural: More than 4 times per year    Marital Status: Married  Catering manager Violence: Not At Risk (05/23/2023)   Humiliation, Afraid, Rape, and Kick questionnaire    Fear of Current or Ex-Partner: No    Emotionally Abused: No    Physically Abused: No    Sexually Abused: No   Family History  Problem Relation Age of Onset   Colon cancer Mother 44   Heart attack Father    Heart disease Sister    Breast cancer Sister 1    Breast cancer Sister        dx 26s   Cancer Maternal Aunt 64       female cancer, unknown type   Cancer Maternal Grandmother        unknown type; dx 73s   Cancer Maternal Grandfather        unknown type; dx 32s   Cancer Cousin        maternal female cousin; dx 66s; unknown type   Cancer Cousin        maternal female cousin; dx 50s; unknown type   Cancer Cousin        maternal female cousin; dx after 63; unknown type   Esophageal cancer Neg Hx    Stomach cancer Neg Hx    Rectal cancer Neg Hx     Objective: Office vital signs reviewed. BP 134/75   Pulse 91   Temp 97.9 F (36.6 C)   Ht 5' 1 (1.549 m)   Wt 252 lb 4 oz (114.4 kg)   SpO2 95%   BMI 47.66 kg/m   Physical Examination:  General: Awake, alert, super morbidly obese, No acute distress HEENT: Sclera white.  No exophthalmos.  Moist mucous membranes Cardio: regular rate and rhythm, S1S2 heard, no murmurs appreciated Pulm: clear to auscultation bilaterally, no wheezes, rhonchi or rales; normal work of breathing on room air GI: soft, non-tender, non-distended, bowel sounds present x4, no hepatomegaly, no splenomegaly, no masses Extremities: warm, well perfused, trace pedal left greater than right edema, no cyanosis or clubbing; +2 pulses bilaterally.  She has varicose veins along the medial aspects of bilateral ankles MSK: Ambulating independently but gait is slow and wide-based  Skin: Flat, patchy lesion consistent with seborrheic keratosis along the right cheek.  Well did not directly visualize the lesion on the vagina she showed me a photograph which showed some slight hyperemia near a hair follicle of the labia majora w/ no apparent pustule or vesicle formation    Assessment/ Plan: 73 y.o. female   Acquired hypothyroidism - Plan: CMP14+EGFR, TSH + free T4  Moderate obstructive sleep apnea - Plan: tirzepatide  (ZEPBOUND ) 2.5 MG/0.5ML Pen, tirzepatide  (ZEPBOUND ) 5 MG/0.5ML Pen, tirzepatide  (ZEPBOUND ) 7.5 MG/0.5ML Pen,  CMP14+EGFR, CBC with Differential  Obesity, Class III, BMI 40-49.9 (morbid obesity) - Plan: tirzepatide  (ZEPBOUND ) 2.5 MG/0.5ML Pen, tirzepatide  (ZEPBOUND ) 5 MG/0.5ML Pen, tirzepatide  (ZEPBOUND ) 7.5 MG/0.5ML Pen, CMP14+EGFR, Lipid Panel, VITAMIN D  25 Hydroxy (Vit-D Deficiency, Fractures)  Need for shingles vaccine - Plan: Zoster Recombinant (Shingrix  ), CMP14+EGFR  Prediabetes - Plan: Bayer DCA Hb A1c Waived, CMP14+EGFR  Varicose veins of both legs with edema - Plan: Ambulatory referral to Vascular Surgery  Rib pain on left side  Vaginal lesion - Plan: clotrimazole -betamethasone  (LOTRISONE ) cream  Seborrheic keratosis  Has morbid obesity and what I think is a venous stasis but will check thyroid  levels, CMP and fasting labs today  I counseled her at length today on treatment for morbid obesity, moderate obstructive sleep apnea.  See sleep study above.  No apparent contraindications to Zepbound  use. Patient's BMI is >30 mg/m2.  Patient's current BMI is Body mass index is 47.66 kg/m.SABRA  Patient is currently enrolled in a healthy eating plan along with encouraged exercise.  Patient has contraindications to phentermine , Contrave & Qsymia (contains phentermine ).  Patient does not have a personal or family history of medullary thyroid  carcinoma (MTC) or Multiple Endocrine Neoplasia syndrome type 2 (MEN 2).   I think that weight loss would help with her urinary incontinence issues, chronic pain issues, stasis issues and of course hopefully reverse some of the sleep apnea and prediabetes that she is experience.  In the meantime I did go ahead and place referral to vascular surgery to evaluate her stasis edema that is likely associated with morbid obesity and varicose veins.  Compression hose, elevation and salt restriction recommended  Will treat the vaginal lesion with some topical clotrimazole  and betamethasone  cream but I did advise her to consider doing warm compresses for a couple of days first  to see if maybe this is simply an ingrown hair.  Continue to follow-up with OB/GYN and urogyn for ongoing evaluation  Lesion of concern on the cheek appears to be consistent with seborrheic keratosis  Total time spent with patient 40 minutes.  Greater than 50% of encounter spent in coordination of care/counseling.  Norene CHRISTELLA Fielding, DO [facility] [phone]

## 2024-03-25 NOTE — Patient Instructions (Addendum)
 Tips for success with Zepbound  (and by success, how not to be super sick on your stomach): Eat small meals AVOID heavy foods (fried/ high in carbs like bread, pasta, rice) AVOID carbonated beverages (soda/ beer, as these can increase bloating) DOUBLE your water  intake (will help you avoid constipation/ dehydration) INCREASE protein intake Strength training important to reduce risk of muscle loss  Zepbound  CAN cause: Nausea Abdominal pain and bloating Increased acid reflux (sometimes presents as sour burps) Constipation OR Diarrhea Fatigue (especially when you first start it)  Chronic Venous Insufficiency Chronic venous insufficiency is a condition that causes the veins in the legs to struggle to pump blood from the legs to the heart. It is also called venous stasis. This condition can happen when the vein walls are stretched, weakened, or damaged. It can also happen when the valves inside the vein are damaged. With the right treatment, you should be able to still lead an active life. What are the causes? Common causes of this condition include: Venous hypertension. This is high blood pressure inside the veins. Sitting or standing too long. This can cause increased blood pressure in the veins of the leg. Deep vein thrombosis (DVT). This is a blood clot that blocks blood flow in a vein. Phlebitis. This is inflammation of a vein. It can cause a blood clot to form. An abnormal growth of cells (tumor) in the area between your hip bones (pelvis). This can cause blood to back up. What increases the risk? Factors that may make you more likely to get this condition include: Having a family history of the condition. Being overweight. Being pregnant. Not getting enough exercise. Smoking. Having a job that requires you to sit or stand in one place for a long time. Being a certain age. Females in their 36s and 57s and males in their 21s are more likely to get this condition. What are the signs  or symptoms? Symptoms of this condition include: Varicose veins. These are veins that are enlarged, bulging, or twisted. Skin breakdown or ulcers. Reddened skin or dark discoloration of the skin on the leg between the knee and ankle. Lipodermatosclerosis. This is brown, smooth, tight, and painful skin just above the ankle. It is often on the inside of the leg. Swelling of the legs. How is this diagnosed? This condition may be diagnosed based on your medical history and a physical exam. You may also need tests, such as: A duplex ultrasound. This shows how blood flows through a blood vessel. Plethysmography. This tests blood flow. Venogram. This looks at the veins using an X-ray and dye. How is this treated? The goals of treatment are to help you return to an active life and to relieve pain. Treatment may include: Wearing compression stockings. These do not cure the condition but can help relieve symptoms. They can also help stop your condition from getting worse. Sclerotherapy. This involves injecting a solution to shrink damaged veins. Surgery. This may include: Vein stripping. This is when a diseased vein is taken out. Laser ablation surgery. This is when blood flow is cut off through the vein. Repairing or remaking a valve inside the affected vein. Follow these instructions at home: Lifestyle Do not use any products that contain nicotine or tobacco. These products include cigarettes, chewing tobacco, and vaping devices, such as e-cigarettes. If you need help quitting, ask your health care provider. Stay active. Exercise, walk, or do other activities. Ask your provider what activities are safe for you. General instructions Take over-the-counter  and prescription medicines only as told by your provider. Drink enough fluid to keep your pee (urine) pale yellow. Wear compression stockings as told by your provider. These stockings help to prevent blood clots and reduce swelling in your  legs. Keep all follow-up visits. Your provider will check your legs for any changes and adjust your treatment plan as needed. Contact a health care provider if: You have redness, swelling, or more pain in the affected area. You see a red streak or line that goes up or down from the area. You have skin breakdown or skin loss. You get an injury in the affected area. You get a fever. Get help right away if: You have severe pain that does not get better with medicine. You get an injury and an open wound in the affected area. Your foot or ankle becomes numb or weak all of a sudden. You have trouble moving your foot or ankle. Your symptoms do not go away or get worse. You have chest pain. You have shortness of breath. These symptoms may be an emergency. Get help right away. Call 911. Do not wait to see if the symptoms will go away. Do not drive yourself to the hospital. This information is not intended to replace advice given to you by your health care provider. Make sure you discuss any questions you have with your health care provider. Document Revised: 08/14/2022 Document Reviewed: 08/14/2022 Elsevier Patient Education  2024 [employer].

## 2024-05-21 ENCOUNTER — Ambulatory Visit: Admitting: Obstetrics

## 2024-05-22 ENCOUNTER — Ambulatory Visit: Payer: HMO

## 2024-05-29 ENCOUNTER — Ambulatory Visit

## 2024-05-29 VITALS — BP 134/75 | HR 91 | Ht 61.0 in | Wt 252.0 lb

## 2024-05-29 DIAGNOSIS — Z0000 Encounter for general adult medical examination without abnormal findings: Secondary | ICD-10-CM

## 2024-05-29 NOTE — Patient Instructions (Signed)
 Stacie Russell,  Thank you for taking the time for your Medicare Wellness Visit. I appreciate your continued commitment to your health goals. Please review the care plan we discussed, and feel free to reach out if I can assist you further.  Medicare recommends these wellness visits once per year to help you and your care team stay ahead of potential health issues. These visits are designed to focus on prevention, allowing your provider to concentrate on managing your acute and chronic conditions during your regular appointments.  Please note that Annual Wellness Visits do not include a physical exam. Some assessments may be limited, especially if the visit was conducted virtually. If needed, we may recommend a separate in-person follow-up with your provider.  Ongoing Care Seeing your primary care provider every 3 to 6 months helps us  monitor your health and provide consistent, personalized care.   Referrals If a referral was made during today's visit and you haven't received any updates within two weeks, please contact the referred provider directly to check on the status.  Recommended Screenings:  Health Maintenance  Topic Date Due   COVID-19 Vaccine (3 - Moderna risk series) 09/28/2020   Zoster (Shingles) Vaccine (2 of 2) 05/20/2024   Medicare Annual Wellness Visit  05/22/2024   Flu Shot  11/10/2024*   DEXA scan (bone density measurement)  08/07/2024   Breast Cancer Screening  08/12/2024   Colon Cancer Screening  06/27/2026   DTaP/Tdap/Td vaccine (3 - Td or Tdap) 07/18/2032   Pneumococcal Vaccine for age over 53  Completed   Hepatitis C Screening  Completed   Meningitis B Vaccine  Aged Out  *Topic was postponed. The date shown is not the original due date.       05/29/2024    9:41 AM  Advanced Directives  Does Patient Have a Medical Advance Directive? Yes  Type of Estate agent of [place];Living will  Copy of Healthcare Power of Attorney in Chart? Yes -  validated most recent copy scanned in chart (See row information)   Advance Care Planning is important because it: Ensures you receive medical care that aligns with your values, goals, and preferences. Provides guidance to your family and loved ones, reducing the emotional burden of decision-making during critical moments.  Vision: Annual vision screenings are recommended for early detection of glaucoma, cataracts, and diabetic retinopathy. These exams can also reveal signs of chronic conditions such as diabetes and high blood pressure.  Dental: Annual dental screenings help detect early signs of oral cancer, gum disease, and other conditions linked to overall health, including heart disease and diabetes.  Please see the attached documents for additional preventive care recommendations.

## 2024-05-29 NOTE — Progress Notes (Signed)
 Subjective:   Stacie Russell is a 73 y.o. who presents for a Medicare Wellness preventive visit.  As a reminder, Annual Wellness Visits don't include a physical exam, and some assessments may be limited, especially if this visit is performed virtually. We may recommend an in-person follow-up visit with your provider if needed.  Visit Complete: Virtual I connected with  Stacie Russell on 05/29/24 by a audio enabled telemedicine application and verified that I am speaking with the correct person using two identifiers.  Patient Location: Home  Provider Location: Home Office  I discussed the limitations of evaluation and management by telemedicine. The patient expressed understanding and agreed to proceed.  Vital Signs: Because this visit was a virtual/telehealth visit, some criteria may be missing or patient reported. Any vitals not documented were not able to be obtained and vitals that have been documented are patient reported.  VideoDeclined- This patient declined Librarian, academic. Therefore the visit was completed with audio only.  Persons Participating in Visit: Patient.  AWV Questionnaire: No: Patient Medicare AWV questionnaire was not completed prior to this visit.  Cardiac Risk Factors include: advanced age (>37men, >28 women);dyslipidemia;hypertension;obesity (BMI >30kg/m2)     Objective:    Today's Vitals   05/29/24 1111  BP: 134/75  Pulse: 91  Weight: 252 lb (114.3 kg)  Height: 5' 1 (1.549 m)   Body mass index is 47.61 kg/m.     05/29/2024    9:41 AM 05/23/2023    8:06 AM 02/05/2023    3:02 PM 05/14/2022    1:20 PM 08/03/2021    2:39 PM 05/12/2021   12:54 PM 05/03/2021    4:38 PM  Advanced Directives  Does Patient Have a Medical Advance Directive? Yes Yes Yes Yes Yes Yes Yes  Type of Estate agent of [place];Living will Healthcare Power of [place];Living will Healthcare Power of [place];Living will  Healthcare Power of [place];Living will Healthcare Power of [place];Living will Healthcare Power of [place];Living will Healthcare Power of [place];Living will  Does patient want to make changes to medical advance directive?  No - Patient declined  No - Patient declined No - Patient declined  No - Patient declined  Copy of Healthcare Power of Attorney in Chart? Yes - validated most recent copy scanned in chart (See row information) Yes - validated most recent copy scanned in chart (See row information)  No - copy requested  Yes - validated most recent copy scanned in chart (See row information) No - copy requested    Current Medications (verified) Outpatient Encounter Medications as of 05/29/2024  Medication Sig   acetaminophen  (TYLENOL ) 500 MG tablet Take 1,000 mg by mouth as needed (pain.).   albuterol  (VENTOLIN  HFA) 108 (90 Base) MCG/ACT inhaler Inhale 2 puffs into the lungs every 6 (six) hours as needed for wheezing or shortness of breath.   budesonide -formoterol  (SYMBICORT ) 80-4.5 MCG/ACT inhaler Inhale 2 puffs into the lungs 2 (two) times daily.   busPIRone  (BUSPAR ) 7.5 MG tablet Take 1 tablet (7.5 mg total) by mouth 2 (two) times daily.   Calcium  Carbonate-Vit D-Min (CALCIUM  1200 PO) Take 1 tablet by mouth in the morning.   Cholecalciferol  (VITAMIN D3) 2000 units TABS Take 2,000 Units by mouth in the morning.   clobetasol  ointment (TEMOVATE ) 0.05 % Apply 1 Application topically 3 (three) times a week. Apply cream to the area where you have had itching 3 times a week at night.   clotrimazole -betamethasone  (LOTRISONE ) cream Apply 1 Application topically  2 (two) times daily as needed (itchy rash on vagina for max 10 days per flare up). Apply a pea sized amount and rub in well   DULoxetine  (CYMBALTA ) 30 MG capsule Take 2 capsules by mouth once daily   furosemide  (LASIX ) 20 MG tablet TAKE 1 TABLET BY MOUTH DAILY AS  NEEDED FOR FLUID   levothyroxine  (SYNTHROID ) 75 MCG tablet Take 1 tablet (75  mcg total) by mouth daily before breakfast.   lidocaine  (XYLOCAINE ) 5 % ointment Apply 1 Application topically as needed. Up to 3 times a day   losartan -hydrochlorothiazide  (HYZAAR) 100-12.5 MG tablet Take 1 tablet by mouth daily.   Omega-3 Fatty Acids (FISH OIL) 1000 MG CAPS Take 1,000 mg by mouth in the morning.   pantoprazole  (PROTONIX ) 40 MG tablet Take 1 tablet (40 mg total) by mouth 2 (two) times daily before a meal.   tirzepatide  (ZEPBOUND ) 2.5 MG/0.5ML Pen Inject 2.5 mg into the skin once a week. Month #1   tirzepatide  (ZEPBOUND ) 5 MG/0.5ML Pen Inject 5 mg into the skin once a week. Month #2   tirzepatide  (ZEPBOUND ) 7.5 MG/0.5ML Pen Inject 7.5 mg into the skin once a week. Month #3   valACYclovir  (VALTREX ) 1000 MG tablet TAKE 1 TABLET BY MOUTH TWICE  DAILY FOR 7 DAYS; IF HAVING  ANOTHER FLARE, TAKE 2 TABLETS  TWICE DAILY FOR 1 DAY ONLY PER  FLAREUP   vitamin B-12 (CYANOCOBALAMIN) 1000 MCG tablet Take 1,000 mcg by mouth in the morning.   benzonatate  (TESSALON  PERLES) 100 MG capsule Take 1 capsule (100 mg total) by mouth 3 (three) times daily as needed for cough. (Patient not taking: Reported on 05/29/2024)   estradiol  (ESTRACE ) 0.1 MG/GM vaginal cream Place 0.5-1g nightly for 2 weeks, then twice a week after (Patient not taking: Reported on 05/29/2024)   HYDROcodone  bit-homatropine (HYCODAN) 5-1.5 MG/5ML syrup Take 2.5 mLs by mouth every 6 (six) hours as needed for cough. (Patient not taking: Reported on 05/29/2024)   Trospium  Chloride 60 MG CP24 Take 1 capsule (60 mg total) by mouth daily. (Patient not taking: Reported on 05/29/2024)   No facility-administered encounter medications on file as of 05/29/2024.    Allergies (verified) Tramadol    History: Past Medical History:  Diagnosis Date   Arthritis    Asthma    Diverticulosis    Dyspnea    with exertion    Endometrial hyperplasia    Esophageal stricture    Family history of breast cancer 01/26/2021   Foot pain    Fundic gland  polyps of stomach, benign 07/10/2015   Gastritis    GERD (gastroesophageal reflux disease)    Hemorrhoid    Hiatal hernia    Hip pain    History of kidney stones    cystoscopy   History of surgery on arm    left humerus   Hyperlipidemia    Hypertension    Hypothyroidism    Knee pain    Metabolic syndrome    Obstructive sleep apnea 08/27/2017   pt denies    Oxygen deficiency    pt denies   PONV (postoperative nausea and vomiting)    Spinal headache    few headaches recently    Tendonitis of ankle    Vitamin D  deficiency    Past Surgical History:  Procedure Laterality Date   ANAL FISSURE REPAIR     CATARACT EXTRACTION  May 2009   COLONOSCOPY  12/26/07   ESOPHAGEAL DILATION     ESOPHAGOGASTRODUODENOSCOPY  12/26/07, 05/13/08  EYE SURGERY Bilateral    bilateral cataract with lens implants   IR IMAGING GUIDED PORT INSERTION  01/25/2021   IR REMOVAL TUN ACCESS W/ PORT W/O FL MOD SED  10/08/2023   KNEE CARTILAGE SURGERY Right Sept 1999   ORIF HUMERUS FRACTURE Left 01/12/2020   Procedure: OPEN REDUCTION INTERNAL FIXATION (ORIF) LEFT HUMERUS FRACTURE;  Surgeon: Camella Fallow, MD;  Location: [facility];  Service: Orthopedics;  Laterality: Left;   ROBOTIC ASSISTED TOTAL HYSTERECTOMY WITH BILATERAL SALPINGO OOPHERECTOMY Bilateral 12/29/2020   Procedure: XI ROBOTIC ASSISTED TOTAL HYSTERECTOMY WITH BILATERAL SALPINGO OOPHORECTOMY;  Surgeon: Eloy Herring, MD;  Location: [facility];  Service: Gynecology;  Laterality: Bilateral;   SENTINEL NODE BIOPSY N/A 12/29/2020   Procedure: SENTINEL NODE BIOPSY;  Surgeon: Eloy Herring, MD;  Location: [facility];  Service: Gynecology;  Laterality: N/A;   TOTAL KNEE ARTHROPLASTY Right 10/07/2017   Procedure: RIGHT TOTAL KNEE ARTHROPLASTY;  Surgeon: Melodi Lerner, MD;  Location: [facility];  Service: Orthopedics;  Laterality: Right;  Adductor Block   UMBILICAL HERNIA REPAIR  05/20/06   Family History  Problem Relation Age of Onset   Colon cancer Mother 71   Heart attack  Father    Heart disease Sister    Breast cancer Sister 20   Breast cancer Sister        dx 69s   Cancer Maternal Aunt 7       female cancer, unknown type   Cancer Maternal Grandmother        unknown type; dx 95s   Cancer Maternal Grandfather        unknown type; dx 31s   Cancer Cousin        maternal female cousin; dx 80s; unknown type   Cancer Cousin        maternal female cousin; dx 62s; unknown type   Cancer Cousin        maternal female cousin; dx after 65; unknown type   Esophageal cancer Neg Hx    Stomach cancer Neg Hx    Rectal cancer Neg Hx    Social History   Socioeconomic History   Marital status: Married    Spouse name: Debby   Number of children: 2   Years of education: 14   Highest education level: Associate degree: academic program  Occupational History   Occupation: Retired  Tobacco Use   Smoking status: Never   Smokeless tobacco: Never  Vaping Use   Vaping status: Never Used  Substance and Sexual Activity   Alcohol use: No   Drug use: No   Sexual activity: Not Currently    Birth control/protection: Post-menopausal  Other Topics Concern   Not on file  Social History Narrative   Married and retired   No EtOH, tobacco or drug use   Social Drivers of Corporate investment banker Strain: Low Risk  (05/29/2024)   Overall Financial Resource Strain (CARDIA)    Difficulty of Paying Living Expenses: Not hard at all  Food Insecurity: No Food Insecurity (05/29/2024)   Hunger Vital Sign    Worried About Running Out of Food in the Last Year: Never true    Ran Out of Food in the Last Year: Never true  Transportation Needs: No Transportation Needs (05/29/2024)   PRAPARE - Administrator, Civil Service (Medical): No    Lack of Transportation (Non-Medical): No  Physical Activity: Inactive (05/29/2024)   Exercise Vital Sign    Days of Exercise per Week: 0 days  Minutes of Exercise per Session: 0 min  Stress: No Stress Concern Present  (05/29/2024)   [employer] of Occupational Health - Occupational Stress Questionnaire    Feeling of Stress: Not at all  Social Connections: Socially Integrated (05/29/2024)   Social Connection and Isolation Panel    Frequency of Communication with Friends and Family: More than three times a week    Frequency of Social Gatherings with Friends and Family: More than three times a week    Attends Religious Services: More than 4 times per year    Active Member of [employer] or Organizations: Yes    Attends Engineer, structural: More than 4 times per year    Marital Status: Married    Tobacco Counseling Counseling given: Yes    Clinical Intake:  Pre-visit preparation completed: Yes  Pain : No/denies pain     BMI - recorded: 47.61 Nutritional Status: BMI > 30  Obese Nutritional Risks: None Diabetes: No  Lab Results  Component Value Date   HGBA1C 5.9 (H) 03/05/2023   HGBA1C 5.4 03/01/2020   HGBA1C 5.4 01/03/2017     How often do you need to have someone help you when you read instructions, pamphlets, or other written materials from your doctor or pharmacy?: 1 - Never  Interpreter Needed?: No  Information entered by :: alia t/cma   Activities of Daily Living     05/29/2024    9:40 AM  In your present state of health, do you have any difficulty performing the following activities:  Hearing? 0  Vision? 0  Difficulty concentrating or making decisions? 0  Walking or climbing stairs? 0  Dressing or bathing? 0  Doing errands, shopping? 0  Preparing Food and eating ? N  Using the Toilet? N  In the past six months, have you accidently leaked urine? Y  Do you have problems with loss of bowel control? N  Managing your Medications? N  Managing your Finances? N  Housekeeping or managing your Housekeeping? N    Patient Care Team: Jolinda Norene HERO, DO as PCP - General (Family Medicine) Court Dorn PARAS, MD as PCP - Cardiology (Cardiology) Watt Rush, MD  (Urology) Estelle Service, MD (Obstetrics and Gynecology) Avram Lupita BRAVO, MD (Gastroenterology) Melodi Lerner, MD as Consulting Physician (Orthopedic Surgery) Lonn Hicks, MD as Consulting Physician (Hematology and Oncology)  I have updated your Care Teams any recent Medical Services you may have received from other providers in the past year.     Assessment:   This is a routine wellness examination for [place].  Hearing/Vision screen Hearing Screening - Comments:: No c/o  Vision Screening - Comments:: Pt wear glasses   Goals Addressed             This Visit's Progress    Patient Stated   On track    05/14/2022 AWV Goal: Fall Prevention  Over the next year, patient will decrease their risk for falls by: Using assistive devices, such as a cane or walker, as needed Identifying fall risks within their home and correcting them by: Removing throw rugs Adding handrails to stairs or ramps Removing clutter and keeping a clear pathway throughout the home Increasing light, especially at night Adding shower handles/bars Raising toilet seat Identifying potential personal risk factors for falls: Medication side effects Incontinence/urgency Vestibular dysfunction Hearing loss Musculoskeletal disorders Neurological disorders Orthostatic hypotension         Depression Screen     05/29/2024    9:41 AM 03/25/2024  9:22 AM 10/30/2023   11:53 AM 08/20/2023    9:17 AM 05/23/2023    8:05 AM 03/18/2023   11:26 AM 01/18/2023   10:18 AM  PHQ 2/9 Scores  PHQ - 2 Score 0 0  1 0 0 2  PHQ- 9 Score  6  4  4 10   Exception Documentation   Patient refusal        Fall Risk     05/29/2024    9:37 AM 03/25/2024    9:22 AM 05/23/2023    8:04 AM 03/18/2023   11:26 AM 03/05/2023    9:52 AM  Fall Risk   Falls in the past year? 1 0 0 0 0  Number falls in past yr: 0 0 0 0 0  Injury with Fall? 0 0 0 0 0  Risk for fall due to : Impaired balance/gait;Impaired mobility No Fall Risks No Fall  Risks No Fall Risks No Fall Risks  Follow up Falls evaluation completed;Education provided Falls evaluation completed Falls prevention discussed Falls evaluation completed Education provided    MEDICARE RISK AT HOME:  Medicare Risk at Home Any stairs in or around the home?: Yes If so, are there any without handrails?: Yes Home free of loose throw rugs in walkways, pet beds, electrical cords, etc?: Yes Adequate lighting in your home to reduce risk of falls?: Yes Life alert?: No Use of a cane, walker or w/c?: No Grab bars in the bathroom?: No Shower chair or bench in shower?: Yes Elevated toilet seat or a handicapped toilet?: No  TIMED UP AND GO:  Was the test performed?  no  Cognitive Function: 6CIT completed    04/22/2018   11:18 AM  MMSE - Mini Mental State Exam  Orientation to time 5  Orientation to Place 5  Registration 3  Attention/ Calculation 5  Recall 3  Language- name 2 objects 2  Language- repeat 1  Language- follow 3 step command 3  Language- read & follow direction 1  Write a sentence 1  Copy design 1  Total score 30        05/29/2024    9:41 AM 05/23/2023    8:07 AM 05/14/2022    1:21 PM 05/11/2020    2:04 PM 04/29/2019   10:46 AM  6CIT Screen  What Year? 0 points 0 points 0 points 0 points 0 points  What month? 0 points 0 points 0 points 0 points 0 points  What time? 0 points 0 points 0 points 0 points 0 points  Count back from 20 0 points 0 points 0 points 0 points 0 points  Months in reverse 0 points 0 points 0 points 0 points 0 points  Repeat phrase 0 points 0 points 0 points 0 points 0 points  Total Score 0 points 0 points 0 points 0 points 0 points    Immunizations Immunization History  Administered Date(s) Administered   Fluad Quad(high Dose 65+) 06/16/2019, 08/31/2020, 05/19/2021, 07/18/2022   Fluad Trivalent(High Dose 65+) 08/20/2023   Fluzone Influenza virus vaccine,trivalent (IIV3), split virus 06/25/2012   INFLUENZA, HIGH DOSE SEASONAL  PF 06/11/2017, 06/25/2018   Influenza Whole 07/25/2012   Influenza, Seasonal, Injecte, Preservative Fre 06/12/2011   Influenza,inj,Quad PF,6+ Mos 05/06/2013, 06/25/2014, 06/20/2015   Influenza-Unspecified 07/11/2000, 06/11/2017   Moderna SARS-COV2 Booster Vaccination 08/31/2020   Moderna Sars-Covid-2 Vaccination 09/14/2019, 10/05/2019   Pneumococcal Conjugate-13 05/06/2013   Pneumococcal Polysaccharide-23 09/05/2016   Td 07/18/2022   Tdap 04/26/2011   Zoster Recombinant(Shingrix ) 03/25/2024  Zoster, Live 04/26/2011    Screening Tests Health Maintenance  Topic Date Due   COVID-19 Vaccine (3 - Moderna risk series) 09/28/2020   Zoster Vaccines- Shingrix  (2 of 2) 05/20/2024   Influenza Vaccine  11/10/2024 (Originally 03/13/2024)   DEXA SCAN  08/07/2024   Mammogram  08/12/2024   Medicare Annual Wellness (AWV)  05/29/2025   Colonoscopy  06/27/2026   DTaP/Tdap/Td (3 - Td or Tdap) 07/18/2032   Pneumococcal Vaccine: 50+ Years  Completed   Hepatitis C Screening  Completed   Meningococcal B Vaccine  Aged Out    Health Maintenance Items Addressed: See Nurse Notes at the end of this note  Additional Screening:  Vision Screening: Recommended annual ophthalmology exams for early detection of glaucoma and other disorders of the eye. Is the patient up to date with their annual eye exam?  Yes  Who is the provider or what is the name of the office in which the patient attends annual eye exams? N/a  Dental Screening: Recommended annual dental exams for proper oral hygiene  Community Resource Referral / Chronic Care Management: CRR required this visit?  Yes   CCM required this visit?  No   Plan:    I have personally reviewed and noted the following in the patient's chart:   Medical and social history Use of alcohol, tobacco or illicit drugs  Current medications and supplements including opioid prescriptions. Patient is not currently taking opioid prescriptions. Functional ability  and status Nutritional status Physical activity Advanced directives List of other physicians Hospitalizations, surgeries, and ER visits in previous 12 months Vitals Screenings to include cognitive, depression, and falls Referrals and appointments  In addition, I have reviewed and discussed with patient certain preventive protocols, quality metrics, and best practice recommendations. A written personalized care plan for preventive services as well as general preventive health recommendations were provided to patient.   Ozie Ned, CMA   05/29/2024   After Visit Summary: (MyChart) Due to this being a telephonic visit, the after visit summary with patients personalized plan was offered to patient via MyChart   Notes: Nothing significant to report at this time.

## 2024-06-09 ENCOUNTER — Other Ambulatory Visit: Payer: Self-pay | Admitting: Vascular Surgery

## 2024-06-09 DIAGNOSIS — M7989 Other specified soft tissue disorders: Secondary | ICD-10-CM

## 2024-06-21 ENCOUNTER — Emergency Department (HOSPITAL_COMMUNITY)

## 2024-06-21 ENCOUNTER — Emergency Department (HOSPITAL_BASED_OUTPATIENT_CLINIC_OR_DEPARTMENT_OTHER)
Admission: EM | Admit: 2024-06-21 | Discharge: 2024-06-21 | Disposition: A | Discharge status: Home / Self Care | Attending: Emergency Medicine | Admitting: Emergency Medicine

## 2024-06-21 ENCOUNTER — Emergency Department (HOSPITAL_BASED_OUTPATIENT_CLINIC_OR_DEPARTMENT_OTHER)

## 2024-06-21 ENCOUNTER — Other Ambulatory Visit: Payer: Self-pay

## 2024-06-21 ENCOUNTER — Encounter (HOSPITAL_BASED_OUTPATIENT_CLINIC_OR_DEPARTMENT_OTHER): Payer: Self-pay | Admitting: Emergency Medicine

## 2024-06-21 DIAGNOSIS — E039 Hypothyroidism, unspecified: Secondary | ICD-10-CM | POA: Insufficient documentation

## 2024-06-21 DIAGNOSIS — R531 Weakness: Secondary | ICD-10-CM | POA: Diagnosis not present

## 2024-06-21 DIAGNOSIS — I6782 Cerebral ischemia: Secondary | ICD-10-CM | POA: Diagnosis not present

## 2024-06-21 DIAGNOSIS — Z79899 Other long term (current) drug therapy: Secondary | ICD-10-CM | POA: Insufficient documentation

## 2024-06-21 DIAGNOSIS — I517 Cardiomegaly: Secondary | ICD-10-CM | POA: Diagnosis not present

## 2024-06-21 DIAGNOSIS — E237 Disorder of pituitary gland, unspecified: Secondary | ICD-10-CM | POA: Diagnosis not present

## 2024-06-21 DIAGNOSIS — I700 Atherosclerosis of aorta: Secondary | ICD-10-CM | POA: Diagnosis not present

## 2024-06-21 DIAGNOSIS — D72829 Elevated white blood cell count, unspecified: Secondary | ICD-10-CM | POA: Insufficient documentation

## 2024-06-21 DIAGNOSIS — I10 Essential (primary) hypertension: Secondary | ICD-10-CM | POA: Insufficient documentation

## 2024-06-21 DIAGNOSIS — R9089 Other abnormal findings on diagnostic imaging of central nervous system: Secondary | ICD-10-CM | POA: Diagnosis not present

## 2024-06-21 DIAGNOSIS — E236 Other disorders of pituitary gland: Secondary | ICD-10-CM

## 2024-06-21 DIAGNOSIS — I493 Ventricular premature depolarization: Secondary | ICD-10-CM | POA: Diagnosis not present

## 2024-06-21 DIAGNOSIS — R220 Localized swelling, mass and lump, head: Secondary | ICD-10-CM | POA: Diagnosis not present

## 2024-06-21 DIAGNOSIS — Z7951 Long term (current) use of inhaled steroids: Secondary | ICD-10-CM | POA: Diagnosis not present

## 2024-06-21 DIAGNOSIS — J449 Chronic obstructive pulmonary disease, unspecified: Secondary | ICD-10-CM | POA: Insufficient documentation

## 2024-06-21 DIAGNOSIS — R410 Disorientation, unspecified: Secondary | ICD-10-CM | POA: Diagnosis not present

## 2024-06-21 DIAGNOSIS — R4182 Altered mental status, unspecified: Secondary | ICD-10-CM | POA: Diagnosis not present

## 2024-06-21 DIAGNOSIS — D332 Benign neoplasm of brain, unspecified: Secondary | ICD-10-CM | POA: Diagnosis not present

## 2024-06-21 LAB — DIFFERENTIAL
Abs Immature Granulocytes: 0.03 K/uL (ref 0.00–0.07)
Basophils Absolute: 0 K/uL (ref 0.0–0.1)
Basophils Relative: 0 %
Eosinophils Absolute: 0.2 K/uL (ref 0.0–0.5)
Eosinophils Relative: 3 %
Immature Granulocytes: 0 %
Lymphocytes Relative: 22 %
Lymphs Abs: 1.5 K/uL (ref 0.7–4.0)
Monocytes Absolute: 0.5 K/uL (ref 0.1–1.0)
Monocytes Relative: 7 %
Neutro Abs: 4.7 K/uL (ref 1.7–7.7)
Neutrophils Relative %: 68 %

## 2024-06-21 LAB — CBC
HCT: 42.2 % (ref 36.0–46.0)
Hemoglobin: 13.6 g/dL (ref 12.0–15.0)
MCH: 30.3 pg (ref 26.0–34.0)
MCHC: 32.2 g/dL (ref 30.0–36.0)
MCV: 94 fL (ref 80.0–100.0)
Platelets: 225 K/uL (ref 150–400)
RBC: 4.49 MIL/uL (ref 3.87–5.11)
RDW: 13.2 % (ref 11.5–15.5)
WBC: 7 K/uL (ref 4.0–10.5)
nRBC: 0 % (ref 0.0–0.2)

## 2024-06-21 LAB — COMPREHENSIVE METABOLIC PANEL WITH GFR
ALT: 23 U/L (ref 0–44)
AST: 25 U/L (ref 15–41)
Albumin: 4.3 g/dL (ref 3.5–5.0)
Alkaline Phosphatase: 98 U/L (ref 38–126)
Anion gap: 9 (ref 5–15)
BUN: 18 mg/dL (ref 8–23)
CO2: 27 mmol/L (ref 22–32)
Calcium: 10 mg/dL (ref 8.9–10.3)
Chloride: 104 mmol/L (ref 98–111)
Creatinine, Ser: 0.62 mg/dL (ref 0.44–1.00)
GFR, Estimated: >60 mL/min (ref >60)
Glucose, Bld: 95 mg/dL (ref 70–99)
Potassium: 3.7 mmol/L (ref 3.5–5.1)
Sodium: 141 mmol/L (ref 135–145)
Total Bilirubin: 0.6 mg/dL (ref 0.0–1.2)
Total Protein: 7.2 g/dL (ref 6.5–8.1)

## 2024-06-21 LAB — URINE DRUG SCREEN
Amphetamines: NEGATIVE
Barbiturates: NEGATIVE
Benzodiazepines: NEGATIVE
Cocaine: NEGATIVE
Fentanyl: NEGATIVE
Methadone Scn, Ur: NEGATIVE
Opiates: NEGATIVE
Tetrahydrocannabinol: NEGATIVE

## 2024-06-21 LAB — URINALYSIS, ROUTINE W REFLEX MICROSCOPIC
Bacteria, UA: NONE SEEN
Bilirubin Urine: NEGATIVE
Glucose, UA: NEGATIVE mg/dL
Hgb urine dipstick: NEGATIVE
Ketones, ur: NEGATIVE mg/dL
Nitrite: NEGATIVE
Protein, ur: NEGATIVE mg/dL
Specific Gravity, Urine: 1.009 (ref 1.005–1.030)
pH: 6.5 (ref 5.0–8.0)

## 2024-06-21 LAB — CBG MONITORING, ED: Glucose-Capillary: 92 mg/dL (ref 70–99)

## 2024-06-21 LAB — PROTIME-INR
INR: 0.9 (ref 0.8–1.2)
Prothrombin Time: 12.9 s (ref 11.4–15.2)

## 2024-06-21 MED ORDER — GADOBUTROL 1 MMOL/ML IV SOLN
10.0000 mL | Freq: Once | INTRAVENOUS | Status: AC | PRN
  Administered 2024-06-21: 10 mL via INTRAVENOUS

## 2024-06-21 MED ORDER — SODIUM CHLORIDE 0.9 % IV BOLUS
1000.0000 mL | Freq: Once | INTRAVENOUS | Status: AC
  Administered 2024-06-21: 1000 mL via INTRAVENOUS

## 2024-06-21 MED ORDER — LORAZEPAM 2 MG/ML IJ SOLN
0.5000 mg | Freq: Once | INTRAMUSCULAR | Status: AC
  Administered 2024-06-21: 0.5 mg via INTRAVENOUS
  Filled 2024-06-21: qty 1

## 2024-06-21 NOTE — ED Notes (Signed)
 Patient transported to CT

## 2024-06-21 NOTE — ED Provider Notes (Signed)
 Patient seen after prior ED provider.    Patient sent in to [facility] for MRI brain.  Patient understands plan of care.    She is comfortable at this time.   Laurice Maude BROCKS, MD 06/21/24 [phone]

## 2024-06-21 NOTE — ED Triage Notes (Signed)
 Reports generalized weakness and intermittent confusion   since 1500 yesterday.  States she took Xanax  yesterday before episode started.   No issues with gait, speaking in full sentences, and no unilateral deficits.,

## 2024-06-21 NOTE — ED Provider Notes (Signed)
  ED Course / MDM   Clinical Course as of 06/21/24 1627  Sun Jun 21, 2024  1529 Received sign out from Dr. Laurice pending MRI brain, has pituitary adenoma.  [WS]  1627 MRI shows pituitary adenoma. No evidence of acute stroke. Patient will follow up with neurosurgery as an outpatient. Will discharge patient to home. All questions answered. Patient comfortable with plan of discharge. Return precautions discussed with patient and specified on the after visit summary.  [WS]    Clinical Course User Index [WS] Francesca Elsie CROME, MD   Medical Decision Making Amount and/or Complexity of Data Reviewed Labs: ordered. Radiology: ordered.  Risk Prescription drug management.         Francesca Elsie CROME, MD 06/21/24 [phone]

## 2024-06-21 NOTE — ED Notes (Signed)
 Patient transported to MRI

## 2024-06-21 NOTE — ED Notes (Signed)
 Patient haws returned from MRI

## 2024-06-21 NOTE — ED Provider Notes (Signed)
 [place] EMERGENCY DEPARTMENT AT [facility] Provider Note   CSN: [id number] Arrival date & time: 06/21/24  9074     Patient presents with: Weakness   Stacie Russell is a 73 y.o. female.   Pt is a 73 yo female with pmhx significant for diverticulitis, htn, hld, hypothyroidism, GERD, kidney stones, and copd.  Starting yesterday around 1500, pt has felt confused.  She did take a Xanax  yesterday before the confusion and thought it was that.  She was outside doing some work yesterday and does not think she drank enough.  She is still feeling a little confused this am.  She is moving her        Prior to Admission medications   Medication Sig Start Date End Date Taking? Authorizing Provider  acetaminophen  (TYLENOL ) 500 MG tablet Take 1,000 mg by mouth as needed (pain.).    [provider]  albuterol  (VENTOLIN  HFA) 108 (90 Base) MCG/ACT inhaler Inhale 2 puffs into the lungs every 6 (six) hours as needed for wheezing or shortness of breath. 09/06/20   Jolinda Potter M, DO  benzonatate  (TESSALON  PERLES) 100 MG capsule Take 1 capsule (100 mg total) by mouth 3 (three) times daily as needed for cough. Patient not taking: Reported on 05/29/2024 10/25/23   Severa Rock CHRISTELLA, FNP  budesonide -formoterol  (SYMBICORT ) 80-4.5 MCG/ACT inhaler Inhale 2 puffs into the lungs 2 (two) times daily. 12/14/21   Jolinda Potter CHRISTELLA, DO  busPIRone  (BUSPAR ) 7.5 MG tablet Take 1 tablet (7.5 mg total) by mouth 2 (two) times daily. 03/05/23   Jolinda Potter CHRISTELLA, DO  Calcium  Carbonate-Vit D-Min (CALCIUM  1200 PO) Take 1 tablet by mouth in the morning.    [provider]  Cholecalciferol  (VITAMIN D3) 2000 units TABS Take 2,000 Units by mouth in the morning.    [provider]  clobetasol  ointment (TEMOVATE ) 0.05 % Apply 1 Application topically 3 (three) times a week. Apply cream to the area where you have had itching 3 times a week at night. 04/02/22   Viktoria Comer SAUNDERS, MD   clotrimazole -betamethasone  (LOTRISONE ) cream Apply 1 Application topically 2 (two) times daily as needed (itchy rash on vagina for max 10 days per flare up). Apply a pea sized amount and rub in well 03/25/24   Jolinda Potter M, DO  DULoxetine  (CYMBALTA ) 30 MG capsule Take 2 capsules by mouth once daily 08/21/23   Jolinda Potter M, DO  estradiol  (ESTRACE ) 0.1 MG/GM vaginal cream Place 0.5-1g nightly for 2 weeks, then twice a week after Patient not taking: Reported on 05/29/2024 10/07/23   Guadlupe Lianne DASEN, MD  furosemide  (LASIX ) 20 MG tablet TAKE 1 TABLET BY MOUTH DAILY AS  NEEDED FOR FLUID 03/30/22   Jolinda Potter M, DO  HYDROcodone  bit-homatropine (HYCODAN) 5-1.5 MG/5ML syrup Take 2.5 mLs by mouth every 6 (six) hours as needed for cough. Patient not taking: Reported on 05/29/2024 10/30/23   Cathlene Marry Lenis, FNP  levothyroxine  (SYNTHROID ) 75 MCG tablet Take 1 tablet (75 mcg total) by mouth daily before breakfast. 03/06/23   Jolinda Potter M, DO  lidocaine  (XYLOCAINE ) 5 % ointment Apply 1 Application topically as needed. Up to 3 times a day 07/01/23   Guadlupe Lianne T, MD  losartan -hydrochlorothiazide  (HYZAAR) 100-12.5 MG tablet Take 1 tablet by mouth daily. 05/13/23   Jolinda Potter CHRISTELLA, DO  Omega-3 Fatty Acids (FISH OIL) 1000 MG CAPS Take 1,000 mg by mouth in the morning.    [provider]  pantoprazole  (PROTONIX ) 40 MG tablet  Take 1 tablet (40 mg total) by mouth 2 (two) times daily before a meal. 03/05/23   Jolinda Potter M, DO  tirzepatide  (ZEPBOUND ) 2.5 MG/0.5ML Pen Inject 2.5 mg into the skin once a week. Month #1 03/25/24   Jolinda Potter M, DO  tirzepatide  (ZEPBOUND ) 5 MG/0.5ML Pen Inject 5 mg into the skin once a week. Month #2 03/25/24   Jolinda Potter M, DO  tirzepatide  (ZEPBOUND ) 7.5 MG/0.5ML Pen Inject 7.5 mg into the skin once a week. Month #3 03/25/24   Jolinda Potter HERO, DO  Trospium  Chloride 60 MG CP24 Take 1 capsule (60 mg total) by mouth daily. Patient not  taking: Reported on 05/29/2024 03/06/24   Guadlupe Dull T, MD  valACYclovir  (VALTREX ) 1000 MG tablet TAKE 1 TABLET BY MOUTH TWICE  DAILY FOR 7 DAYS; IF HAVING  ANOTHER FLARE, TAKE 2 TABLETS  TWICE DAILY FOR 1 DAY ONLY PER  FLAREUP 03/23/22   Jolinda Potter M, DO  vitamin B-12 (CYANOCOBALAMIN) 1000 MCG tablet Take 1,000 mcg by mouth in the morning.    [provider]    Allergies: Tramadol     Review of Systems  Updated Vital Signs BP (!) 160/62   Pulse 77   Temp 98.7 F (37.1 C) (Oral)   Resp 18   SpO2 96%   Physical Exam  (all labs ordered are listed, but only abnormal results are displayed) Labs Reviewed  URINALYSIS, ROUTINE W REFLEX MICROSCOPIC - Abnormal; Notable for the following components:      Result Value   Color, Urine COLORLESS (*)    Leukocytes,Ua TRACE (*)    All other components within normal limits  PROTIME-INR  CBC  DIFFERENTIAL  COMPREHENSIVE METABOLIC PANEL WITH GFR  URINE DRUG SCREEN  CBG MONITORING, ED    EKG: EKG Interpretation Date/Time:  Sunday June 21 2024 09:35:52 EST Ventricular Rate:  74 PR Interval:  43 QRS Duration:  100 QT Interval:  413 QTC Calculation: 459 R Axis:   56  Text Interpretation: Sinus rhythm Ventricular premature complex Short PR interval No significant change since last tracing Confirmed by Dean Clarity [phone]) on 06/21/2024 10:14:37 AM  Radiology: CT HEAD WO CONTRAST Result Date: 06/21/2024 EXAM: CT HEAD WITHOUT CONTRAST 06/21/2024 10:06:06 AM TECHNIQUE: CT of the head was performed without the administration of intravenous contrast. Automated exposure control, iterative reconstruction, and/or weight based adjustment of the mA/kV was utilized to reduce the radiation dose to as low as reasonably achievable. COMPARISON: None available. CLINICAL HISTORY: 72 year old female with generalized weakness, confusion since 1500 hours yesterday. FINDINGS: BRAIN AND VENTRICLES: No acute hemorrhage. No evidence of acute  infarct. No hydrocephalus. No extra-axial collection. No mass effect or midline shift. There is evidence of a skull base invasion heterogeneous intrasellar soft tissue mass (series 5, image 30). Size is estimated at 1.8 cm diameter. Background brain volume within normal limits per age. No significant suprasellar extension or intracranial mass effect from the intrasellar mass. Superimposed calcified atherosclerosis at the skull base is moderate to severe. ORBITS: No acute abnormality. SINUSES: Generally well aerated paranasal sinuses, middle ears and mastoids. CT suggests dehiscence of the floor of the sella turcica. SOFT TISSUES AND SKULL: No acute soft tissue abnormality. No skull fracture. IMPRESSION: 1. Heterogeneous intrasellar mass with skull base invasion (1.8 cm), favor invasive pituitary macroadenoma. Dedicated pituitary protocol brain MRI without and with contrast would best characterize further. 2. No other acute intracranial abnormality. Electronically signed by: Helayne Hurst MD 06/21/2024 10:11 AM EST RP Workstation:  [id number]   DG Chest Portable 1 View Result Date: 06/21/2024 CLINICAL DATA:  Altered mental status EXAM: PORTABLE CHEST 1 VIEW COMPARISON:  08/31/2020 FINDINGS: Mild enlargement of the cardiac silhouette. Artifact overlies the chest. No focal pneumonia, collapse or consolidation. Negative for edema, effusion or pneumothorax. Trachea midline. Aorta atherosclerotic. Degenerative changes throughout the spine and shoulders. IMPRESSION: Cardiomegaly without acute process. Electronically Signed   By: CHRISTELLA.  Shick M.D.   On: 06/21/2024 10:03     Procedures   Medications Ordered in the ED  sodium chloride  0.9 % bolus 1,000 mL (1,000 mLs Intravenous New Bag/Given 06/21/24 9060)                                    Medical Decision Making Amount and/or Complexity of Data Reviewed Labs: ordered. Radiology: ordered.   This patient presents to the ED for concern of weakness, this involves  an extensive number of treatment options, and is a complaint that carries with it a high risk of complications and morbidity.  The differential diagnosis includes cva, infection, electrolyte abn, anemia, dehydration   Co morbidities that complicate the patient evaluation  diverticulitis, htn, hld, hypothyroidism, GERD, kidney stones, and copd   Additional history obtained:  Additional history obtained from epic chart review External records from outside source obtained and reviewed including daughter   Lab Tests:  I Ordered, and personally interpreted labs.  The pertinent results include:  cbc nl, cmp nl, ua nl, inr nl, UDS nl   Imaging Studies ordered:  I ordered imaging studies including cxr and ct head  I independently visualized and interpreted imaging which showed  CXR: Cardiomegaly without acute process.  CT head: Heterogeneous intrasellar mass with skull base invasion (1.8 cm), favor  invasive pituitary macroadenoma. Dedicated pituitary protocol brain MRI without  and with contrast would best characterize further.  2. No other acute intracranial abnormality.   I agree with the radiologist interpretation   Cardiac Monitoring:  The patient was maintained on a cardiac monitor.  I personally viewed and interpreted the cardiac monitored which showed an underlying rhythm of: nsr   Medicines ordered and prescription drug management:  I ordered medication including ivfs  for sx  Reevaluation of the patient after these medicines showed that the patient improved I have reviewed the patients home medicines and have made adjustments as needed   Test Considered:  Ct/mri   Critical Interventions:  mri   Consultations Obtained:  I requested consultation with the neurologist (Dr. Matthews),  and discussed lab and imaging findings as well as pertinent plan - she recommends NS consult and MRI pituitary protocol Pt d/w Dr. Garst (NS).  He said work up (including MRI) can  be done as an outpatient.  He does not  think sx are from this tumor.     Problem List / ED Course:  Generalized weakness:  pt's sx are nonfocal.  However, she is worried for CVA.  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  Per NS, this is not the cause of sx.  However, I think we should get a MRI to r/o stroke, so we should also get a MRI to more fully evaluate her pituitary tumor while we are there.  That way, we can speed up the work up of the tumor.  So, I am going to send her to [facility] for the MRI.  Dr. Laurice is  accepting.  If neg for CVA, she can go home and f/u with NS for her tumor.  Pt's daughter is going to drive her via POV.   Reevaluation:  After the interventions noted above, I reevaluated the patient and found that they have :improved   Social Determinants of Health:  Lives at home   Dispostion:  After consideration of the diagnostic results and the patients response to treatment, I feel that the patent would benefit from tx to [facility].       Final diagnoses:  Pituitary mass  Generalized weakness    ED Discharge Orders     None          Dean Clarity, MD 06/21/24 1131

## 2024-06-21 NOTE — Discharge Instructions (Addendum)
 We evaluated you for your weakness. Your testing was reassuring. We did find a pituitary adenoma on your CT scan and MRI but this is not likely to be related to your symptoms. Please follow up with your primary doctor and neurosurgery (Dr. Darnella) for further care.

## 2024-06-21 NOTE — ED Notes (Signed)
 Patient currently in MRI.

## 2024-06-26 DIAGNOSIS — M542 Cervicalgia: Secondary | ICD-10-CM | POA: Diagnosis not present

## 2024-06-26 DIAGNOSIS — Z6841 Body Mass Index (BMI) 40.0 and over, adult: Secondary | ICD-10-CM | POA: Diagnosis not present

## 2024-06-26 DIAGNOSIS — M48061 Spinal stenosis, lumbar region without neurogenic claudication: Secondary | ICD-10-CM | POA: Diagnosis not present

## 2024-06-26 DIAGNOSIS — M48062 Spinal stenosis, lumbar region with neurogenic claudication: Secondary | ICD-10-CM | POA: Diagnosis not present

## 2024-06-26 DIAGNOSIS — D497 Neoplasm of unspecified behavior of endocrine glands and other parts of nervous system: Secondary | ICD-10-CM | POA: Diagnosis not present

## 2024-07-13 ENCOUNTER — Other Ambulatory Visit: Payer: Self-pay | Admitting: Family Medicine

## 2024-07-13 DIAGNOSIS — B009 Herpesviral infection, unspecified: Secondary | ICD-10-CM

## 2024-07-15 DIAGNOSIS — M5126 Other intervertebral disc displacement, lumbar region: Secondary | ICD-10-CM | POA: Diagnosis not present

## 2024-07-15 DIAGNOSIS — M47816 Spondylosis without myelopathy or radiculopathy, lumbar region: Secondary | ICD-10-CM | POA: Diagnosis not present

## 2024-07-15 DIAGNOSIS — D352 Benign neoplasm of pituitary gland: Secondary | ICD-10-CM | POA: Diagnosis not present

## 2024-07-15 DIAGNOSIS — M48062 Spinal stenosis, lumbar region with neurogenic claudication: Secondary | ICD-10-CM | POA: Diagnosis not present

## 2024-07-15 DIAGNOSIS — M4802 Spinal stenosis, cervical region: Secondary | ICD-10-CM | POA: Diagnosis not present

## 2024-07-15 DIAGNOSIS — M5137 Other intervertebral disc degeneration, lumbosacral region with discogenic back pain only: Secondary | ICD-10-CM | POA: Diagnosis not present

## 2024-07-15 DIAGNOSIS — E039 Hypothyroidism, unspecified: Secondary | ICD-10-CM | POA: Diagnosis not present

## 2024-07-15 DIAGNOSIS — M542 Cervicalgia: Secondary | ICD-10-CM | POA: Diagnosis not present

## 2024-07-15 DIAGNOSIS — M47812 Spondylosis without myelopathy or radiculopathy, cervical region: Secondary | ICD-10-CM | POA: Diagnosis not present

## 2024-07-15 DIAGNOSIS — R635 Abnormal weight gain: Secondary | ICD-10-CM | POA: Diagnosis not present

## 2024-07-15 DIAGNOSIS — M5136 Other intervertebral disc degeneration, lumbar region with discogenic back pain only: Secondary | ICD-10-CM | POA: Diagnosis not present

## 2024-07-15 DIAGNOSIS — I10 Essential (primary) hypertension: Secondary | ICD-10-CM | POA: Diagnosis not present

## 2024-07-15 LAB — LAB REPORT - SCANNED
EGFR: 91
Free T4: 0.52 ng/dL
TSH: 20.57 — AB (ref 0.41–5.90)

## 2024-07-16 ENCOUNTER — Ambulatory Visit: Admitting: Physician Assistant

## 2024-07-16 ENCOUNTER — Ambulatory Visit (HOSPITAL_COMMUNITY)
Admission: RE | Admit: 2024-07-16 | Discharge: 2024-07-16 | Disposition: A | Source: Ambulatory Visit | Attending: Surgery | Admitting: Surgery

## 2024-07-16 VITALS — BP 140/88 | HR 71 | Temp 97.7°F | Wt 250.7 lb

## 2024-07-16 DIAGNOSIS — M7989 Other specified soft tissue disorders: Secondary | ICD-10-CM | POA: Insufficient documentation

## 2024-07-16 DIAGNOSIS — I872 Venous insufficiency (chronic) (peripheral): Secondary | ICD-10-CM | POA: Diagnosis not present

## 2024-07-17 ENCOUNTER — Other Ambulatory Visit

## 2024-07-22 NOTE — Progress Notes (Signed)
 Requested by:  Jolinda Norene HERO, DO [address] [place],  [place] [address]  Reason for consultation: leg swelling    History of Present Illness   Stacie Russell is a 73 y.o. (July 13, 1951) female who presents for evaluation of lower extremity swelling.  The patient states over the past year she has noticed that her ankles will be swollen by the end of the day, left greater than right.  This does not cause her any pain or discomfort.  Overall she wanted to make sure that her blood flow was okay and that her swelling was not a red flag.   She says that she moves around throughout the day and usually spending prolonged periods of time on her feet will worsen her swelling.  She does not wear any compression stockings.  She elevates her legs intermittently.  She has no prior history of DVT.  She denies any itching, burning, bleeding, or ulcerations.   Past Medical History:  Diagnosis Date   Arthritis    Asthma    Diverticulosis    Dyspnea    with exertion    Endometrial hyperplasia    Esophageal stricture    Family history of breast cancer 01/26/2021   Foot pain    Fundic gland polyps of stomach, benign 07/10/2015   Gastritis    GERD (gastroesophageal reflux disease)    Hemorrhoid    Hiatal hernia    Hip pain    History of kidney stones    cystoscopy   History of surgery on arm    left humerus   Hyperlipidemia    Hypertension    Hypothyroidism    Knee pain    Metabolic syndrome    Obstructive sleep apnea 08/27/2017   pt denies    Oxygen deficiency    pt denies   PONV (postoperative nausea and vomiting)    Spinal headache    few headaches recently    Tendonitis of ankle    Vitamin D  deficiency     Past Surgical History:  Procedure Laterality Date   ANAL FISSURE REPAIR     CATARACT EXTRACTION  May 2009   COLONOSCOPY  12/26/07   ESOPHAGEAL DILATION     ESOPHAGOGASTRODUODENOSCOPY  12/26/07, 05/13/08   EYE SURGERY Bilateral    bilateral cataract with lens implants    IR IMAGING GUIDED PORT INSERTION  01/25/2021   IR REMOVAL TUN ACCESS W/ PORT W/O FL MOD SED  10/08/2023   KNEE CARTILAGE SURGERY Right Sept 1999   ORIF HUMERUS FRACTURE Left 01/12/2020   Procedure: OPEN REDUCTION INTERNAL FIXATION (ORIF) LEFT HUMERUS FRACTURE;  Surgeon: Camella Fallow, MD;  Location: [facility];  Service: Orthopedics;  Laterality: Left;   ROBOTIC ASSISTED TOTAL HYSTERECTOMY WITH BILATERAL SALPINGO OOPHERECTOMY Bilateral 12/29/2020   Procedure: XI ROBOTIC ASSISTED TOTAL HYSTERECTOMY WITH BILATERAL SALPINGO OOPHORECTOMY;  Surgeon: Eloy Herring, MD;  Location: [facility];  Service: Gynecology;  Laterality: Bilateral;   SENTINEL NODE BIOPSY N/A 12/29/2020   Procedure: SENTINEL NODE BIOPSY;  Surgeon: Eloy Herring, MD;  Location: [facility];  Service: Gynecology;  Laterality: N/A;   TOTAL KNEE ARTHROPLASTY Right 10/07/2017   Procedure: RIGHT TOTAL KNEE ARTHROPLASTY;  Surgeon: Melodi Lerner, MD;  Location: [facility];  Service: Orthopedics;  Laterality: Right;  Adductor Block   UMBILICAL HERNIA REPAIR  05/20/06    Social History   Socioeconomic History   Marital status: Married    Spouse name: Debby   Number of children: 2   Years of  education: 14   Highest education level: Associate degree: academic program  Occupational History   Occupation: Retired  Tobacco Use   Smoking status: Never   Smokeless tobacco: Never  Vaping Use   Vaping status: Never Used  Substance and Sexual Activity   Alcohol use: No   Drug use: No   Sexual activity: Not Currently    Birth control/protection: Post-menopausal  Other Topics Concern   Not on file  Social History Narrative   Married and retired   No EtOH, tobacco or drug use   Social Drivers of Corporate Investment Banker Strain: Low Risk  (05/29/2024)   Overall Financial Resource Strain (CARDIA)    Difficulty of Paying Living Expenses: Not hard at all  Food Insecurity: No Food Insecurity (05/29/2024)   Hunger Vital Sign    Worried About Running Out  of Food in the Last Year: Never true    Ran Out of Food in the Last Year: Never true  Transportation Needs: No Transportation Needs (05/29/2024)   PRAPARE - Administrator, Civil Service (Medical): No    Lack of Transportation (Non-Medical): No  Physical Activity: Inactive (05/29/2024)   Exercise Vital Sign    Days of Exercise per Week: 0 days    Minutes of Exercise per Session: 0 min  Stress: No Stress Concern Present (05/29/2024)   [employer] of Occupational Health - Occupational Stress Questionnaire    Feeling of Stress: Not at all  Social Connections: Socially Integrated (05/29/2024)   Social Connection and Isolation Panel    Frequency of Communication with Friends and Family: More than three times a week    Frequency of Social Gatherings with Friends and Family: More than three times a week    Attends Religious Services: More than 4 times per year    Active Member of [employer] or Organizations: Yes    Attends Engineer, Structural: More than 4 times per year    Marital Status: Married  Catering Manager Violence: Not At Risk (05/29/2024)   Humiliation, Afraid, Rape, and Kick questionnaire    Fear of Current or Ex-Partner: No    Emotionally Abused: No    Physically Abused: No    Sexually Abused: No    Family History  Problem Relation Age of Onset   Colon cancer Mother 50   Heart attack Father    Heart disease Sister    Breast cancer Sister 66   Breast cancer Sister        dx 70s   Cancer Maternal Aunt 108       female cancer, unknown type   Cancer Maternal Grandmother        unknown type; dx 35s   Cancer Maternal Grandfather        unknown type; dx 29s   Cancer Cousin        maternal female cousin; dx 44s; unknown type   Cancer Cousin        maternal female cousin; dx 35s; unknown type   Cancer Cousin        maternal female cousin; dx after 16; unknown type   Esophageal cancer Neg Hx    Stomach cancer Neg Hx    Rectal cancer Neg Hx      Current Outpatient Medications  Medication Sig Dispense Refill   acetaminophen  (TYLENOL ) 500 MG tablet Take 1,000 mg by mouth as needed (pain.).     albuterol  (VENTOLIN  HFA) 108 (90 Base) MCG/ACT inhaler Inhale 2 puffs into  the lungs every 6 (six) hours as needed for wheezing or shortness of breath. 6.7 g 1   benzonatate  (TESSALON  PERLES) 100 MG capsule Take 1 capsule (100 mg total) by mouth 3 (three) times daily as needed for cough. (Patient not taking: Reported on 05/29/2024) 20 capsule 0   budesonide -formoterol  (SYMBICORT ) 80-4.5 MCG/ACT inhaler Inhale 2 puffs into the lungs 2 (two) times daily. 3 each 11   busPIRone  (BUSPAR ) 7.5 MG tablet Take 1 tablet (7.5 mg total) by mouth 2 (two) times daily. 180 tablet 3   Calcium  Carbonate-Vit D-Min (CALCIUM  1200 PO) Take 1 tablet by mouth in the morning.     Cholecalciferol  (VITAMIN D3) 2000 units TABS Take 2,000 Units by mouth in the morning.     clobetasol  ointment (TEMOVATE ) 0.05 % Apply 1 Application topically 3 (three) times a week. Apply cream to the area where you have had itching 3 times a week at night. 30 g 0   clotrimazole -betamethasone  (LOTRISONE ) cream Apply 1 Application topically 2 (two) times daily as needed (itchy rash on vagina for max 10 days per flare up). Apply a pea sized amount and rub in well 30 g 0   DULoxetine  (CYMBALTA ) 30 MG capsule Take 2 capsules by mouth once daily 180 capsule 1   estradiol  (ESTRACE ) 0.1 MG/GM vaginal cream Place 0.5-1g nightly for 2 weeks, then twice a week after (Patient not taking: Reported on 05/29/2024) 30 g 3   furosemide  (LASIX ) 20 MG tablet TAKE 1 TABLET BY MOUTH DAILY AS  NEEDED FOR FLUID 100 tablet 0   HYDROcodone  bit-homatropine (HYCODAN) 5-1.5 MG/5ML syrup Take 2.5 mLs by mouth every 6 (six) hours as needed for cough. (Patient not taking: Reported on 05/29/2024) 120 mL 0   levothyroxine  (SYNTHROID ) 75 MCG tablet Take 1 tablet (75 mcg total) by mouth daily before breakfast. 90 tablet 3    lidocaine  (XYLOCAINE ) 5 % ointment Apply 1 Application topically as needed. Up to 3 times a day 35.44 g 0   losartan -hydrochlorothiazide  (HYZAAR) 100-12.5 MG tablet Take 1 tablet by mouth daily. 100 tablet 0   Omega-3 Fatty Acids (FISH OIL) 1000 MG CAPS Take 1,000 mg by mouth in the morning.     pantoprazole  (PROTONIX ) 40 MG tablet Take 1 tablet (40 mg total) by mouth 2 (two) times daily before a meal. 200 tablet 3   tirzepatide  (ZEPBOUND ) 2.5 MG/0.5ML Pen Inject 2.5 mg into the skin once a week. Month #1 2 mL 0   tirzepatide  (ZEPBOUND ) 5 MG/0.5ML Pen Inject 5 mg into the skin once a week. Month #2 2 mL 0   tirzepatide  (ZEPBOUND ) 7.5 MG/0.5ML Pen Inject 7.5 mg into the skin once a week. Month #3 2 mL 0   Trospium  Chloride 60 MG CP24 Take 1 capsule (60 mg total) by mouth daily. (Patient not taking: Reported on 05/29/2024) 30 capsule 6   valACYclovir  (VALTREX ) 1000 MG tablet TAKE 1 TABLET BY MOUTH TWICE DAILY FOR 7 DAYS. IF YOU HAVE ANOTHER FLARE, TAKE 2 TABLETS TWICE DAILY FOR 1 DAY ONLY PER FLAREUP 30 tablet 2   vitamin B-12 (CYANOCOBALAMIN) 1000 MCG tablet Take 1,000 mcg by mouth in the morning.     No current facility-administered medications for this visit.    Allergies  Allergen Reactions   Tramadol  Other (See Comments)    Unsure of reaction    REVIEW OF SYSTEMS (negative unless checked):   Cardiac:  []  Chest pain or chest pressure? []  Shortness of breath upon activity? []  Shortness of  breath when lying flat? []  Irregular heart rhythm?  Vascular:  []  Pain in calf, thigh, or hip brought on by walking? []  Pain in feet at night that wakes you up from your sleep? []  Blood clot in your veins? [x]  Leg swelling?  Pulmonary:  []  Oxygen at home? []  Productive cough? []  Wheezing?  Neurologic:  []  Sudden weakness in arms or legs? []  Sudden numbness in arms or legs? []  Sudden onset of difficult speaking or slurred speech? []  Temporary loss of vision in one eye? []  Problems with  dizziness?  Gastrointestinal:  []  Blood in stool? []  Vomited blood?  Genitourinary:  []  Burning when urinating? []  Blood in urine?  Psychiatric:  []  Major depression  Hematologic:  []  Bleeding problems? []  Problems with blood clotting?  Dermatologic:  []  Rashes or ulcers?  Constitutional:  []  Fever or chills?  Ear/Nose/Throat:  []  Change in hearing? []  Nose bleeds? []  Sore throat?  Musculoskeletal:  []  Back pain? []  Joint pain? []  Muscle pain?   Physical Examination     Vitals:   07/16/24 1058  BP: (!) 140/88  Pulse: 71  Temp: 97.7 F (36.5 C)  TempSrc: Temporal  Weight: 250 lb 11.2 oz (113.7 kg)   Body mass index is 47.37 kg/m.  General:  WDWN in NAD; vital signs documented above Gait: Not observed HENT: WNL, normocephalic Pulmonary: normal non-labored breathing Cardiac: regular Abdomen: soft, NT, no masses Skin: without rashes Vascular Exam/Pulses: palpable DP pulses Extremities: without varicose veins, with reticular veins, with mild nonpitting ankle edema, without stasis pigmentation, without lipodermatosclerosis, without ulcers Musculoskeletal: no muscle wasting or atrophy  Neurologic: A&O X 3;  No focal weakness or paresthesias are detected Psychiatric:  The pt has Normal affect.  Non-invasive Vascular Imaging   LLE Venous Insufficiency Duplex (07/16/2024):  +--------------+---------+------+-----------+------------+--------+  LEFT         Reflux NoRefluxReflux TimeDiameter cmsComments                          Yes                                   +--------------+---------+------+-----------+------------+--------+  CFV          no                                              +--------------+---------+------+-----------+------------+--------+  FV prox       no                                              +--------------+---------+------+-----------+------------+--------+  FV mid        no                                               +--------------+---------+------+-----------+------------+--------+  FV dist       no                                              +--------------+---------+------+-----------+------------+--------+  Popliteal    no                                              +--------------+---------+------+-----------+------------+--------+  GSV at [facility]    no                            0.45              +--------------+---------+------+-----------+------------+--------+  GSV prox thighno                            0.61              +--------------+---------+------+-----------+------------+--------+  GSV mid thigh no                            0.29              +--------------+---------+------+-----------+------------+--------+  GSV dist thighno                            0.23              +--------------+---------+------+-----------+------------+--------+  GSV at knee   no                            0.21              +--------------+---------+------+-----------+------------+--------+  GSV prox calf no                            0.18              +--------------+---------+------+-----------+------------+--------+  GSV mid calf            yes    >500 ms      0.13              +--------------+---------+------+-----------+------------+--------+  GSV dist calf no                            0.12              +--------------+---------+------+-----------+------------+--------+  SSV prox calf no                            0.16              +--------------+---------+------+-----------+------------+--------+  SSV mid calf  no                            0.14              +--------------+---------+------+-----------+------------+--------+     Medical Decision Making   Stacie Russell is a 73 y.o. female who presents for evaluation of leg swelling  Based on the patient's duplex, there is reflux in the  left GSV at the mid calf. The remainder of the patient's deep and superficial venous system is competent. There is no evidence of SVT or DVT. She would not be a candidate for saphenous vein ablation given that it is competent throughout the thigh. The  patient says over the past year she has noticed her ankles will be swollen by the end of the day, left greater than right. This does not cause her any discomfort or pain. Her swelling is usually worse on the days she has been moving around a lot. She does not regularly elevate her legs. She does not wear compression stockings. She denies any history of DVT. On exam she has mild, nonpitting ankle edema bilaterally. She has palpable pedal pulses. She has no varicose veins or stasis pigmentation.  I have explained to the patient that she has evidence of very mild venous insufficiency, which can be the cause of her swelling. She is aware that she is not a candidate for saphenous vein ablation and this would not benefit her. I have reassured her that her swelling is not leg or life threatening. Her symptoms can be well controlled with conservative therapy including regular leg elevation and use of compression stockings. She is agreeable to trying this. She can follow up with our office as needed   Ahmed SHAUNNA Holster, PA-C [facility] Office: [phone]  Clinic MD: [place]

## 2024-07-22 NOTE — Patient Instructions (Signed)

## 2024-07-27 ENCOUNTER — Encounter: Payer: Self-pay | Admitting: Family Medicine

## 2024-07-27 ENCOUNTER — Ambulatory Visit: Payer: Self-pay | Admitting: Family Medicine

## 2024-07-27 ENCOUNTER — Other Ambulatory Visit: Payer: Self-pay

## 2024-07-27 VITALS — BP 122/73 | HR 66 | Temp 98.2°F | Ht 61.0 in | Wt 252.4 lb

## 2024-07-27 DIAGNOSIS — G4733 Obstructive sleep apnea (adult) (pediatric): Secondary | ICD-10-CM

## 2024-07-27 DIAGNOSIS — D352 Benign neoplasm of pituitary gland: Secondary | ICD-10-CM | POA: Insufficient documentation

## 2024-07-27 DIAGNOSIS — Z23 Encounter for immunization: Secondary | ICD-10-CM

## 2024-07-27 DIAGNOSIS — E039 Hypothyroidism, unspecified: Secondary | ICD-10-CM

## 2024-07-27 DIAGNOSIS — E66813 Obesity, class 3: Secondary | ICD-10-CM

## 2024-07-27 LAB — BAYER DCA HB A1C WAIVED: HB A1C (BAYER DCA - WAIVED): 5.4 % (ref 4.8–5.6)

## 2024-07-27 MED ORDER — ZEPBOUND 7.5 MG/0.5ML ~~LOC~~ SOAJ: 7.5000 mg | SUBCUTANEOUS | 0 refills | Status: AC

## 2024-07-27 MED ORDER — ZEPBOUND 5 MG/0.5ML ~~LOC~~ SOAJ: 5.0000 mg | SUBCUTANEOUS | 0 refills | Status: AC

## 2024-07-27 MED ORDER — LEVOTHYROXINE SODIUM 88 MCG PO TABS: 88.0000 ug | ORAL_TABLET | Freq: Every day | ORAL | Status: AC

## 2024-07-27 MED ORDER — ZEPBOUND 2.5 MG/0.5ML ~~LOC~~ SOAJ: 2.5000 mg | SUBCUTANEOUS | 0 refills | Status: AC

## 2024-07-27 NOTE — Progress Notes (Signed)
 Subjective: CC: Weight check PCP: Jolinda Norene HERO, DO YEP:Ejfzoj HERO Stacie Russell is a 73 y.o. female presenting to clinic today for:  Patient reports that she never did receive the Zepbound .  Uncertain if insurance covered it or if there was a prior authorization that was needed but she notes that she received no call at all from [facility] pharmacy.  She continues to struggle with her low back and the radicular symptoms she has been experiencing.  In fact she is seeing neurosurgery in a couple of weeks to review an MRI that was obtained of her lumbar spine.  She admits that she is moving along more slowly.  She actually had an MRI done of her brain which showed an incidental pituitary adenoma.  She was referred by neurosurgery to endocrinology, Dr. Faythe, and has an appointment with him tomorrow.  He advanced her Synthroid  to 88 mcg daily.  She reports no adverse side effects from that.  Additionally, will have an appointment with ophthalmology on Wednesday to further evaluate for any ophthalmologic changes related to the pituitary adenoma but at this time they are not planning any interventions  To summarize our visit from 3 months ago patient has tried multiple modalities of weight loss including diet, exercise and OTC medications.  She has known moderate obstructive sleep apnea  ROS: Per HPI  Allergies[1] Past Medical History:  Diagnosis Date   Arthritis    Asthma    Diverticulosis    Dyspnea    with exertion    Endometrial hyperplasia    Esophageal stricture    Family history of breast cancer 01/26/2021   Foot pain    Fundic gland polyps of stomach, benign 07/10/2015   Gastritis    GERD (gastroesophageal reflux disease)    Hemorrhoid    Hiatal hernia    Hip pain    History of kidney stones    cystoscopy   History of surgery on arm    left humerus   Hyperlipidemia    Hypertension    Hypothyroidism    Knee pain    Metabolic syndrome    Obstructive sleep apnea 08/27/2017   pt  denies    Oxygen deficiency    pt denies   PONV (postoperative nausea and vomiting)    Spinal headache    few headaches recently    Tendonitis of ankle    Vitamin D  deficiency    Current Medications[2] Social History   Socioeconomic History   Marital status: Married    Spouse name: Debby   Number of children: 2   Years of education: 14   Highest education level: Associate degree: academic program  Occupational History   Occupation: Retired  Tobacco Use   Smoking status: Never   Smokeless tobacco: Never  Vaping Use   Vaping status: Never Used  Substance and Sexual Activity   Alcohol use: No   Drug use: No   Sexual activity: Not Currently    Birth control/protection: Post-menopausal  Other Topics Concern   Not on file  Social History Narrative   Married and retired   No EtOH, tobacco or drug use   Social Drivers of Health   Tobacco Use: Low Risk (07/16/2024)   Patient History    Smoking Tobacco Use: Never    Smokeless Tobacco Use: Never    Passive Exposure: Not on file  Financial Resource Strain: Low Risk (05/29/2024)   Overall Financial Resource Strain (CARDIA)    Difficulty of Paying Living Expenses: Not hard at  all  Food Insecurity: No Food Insecurity (05/29/2024)   Epic    Worried About Programme Researcher, Broadcasting/film/video in the Last Year: Never true    Ran Out of Food in the Last Year: Never true  Transportation Needs: No Transportation Needs (05/29/2024)   Epic    Lack of Transportation (Medical): No    Lack of Transportation (Non-Medical): No  Physical Activity: Inactive (05/29/2024)   Exercise Vital Sign    Days of Exercise per Week: 0 days    Minutes of Exercise per Session: 0 min  Stress: No Stress Concern Present (05/29/2024)   [employer] of Occupational Health - Occupational Stress Questionnaire    Feeling of Stress: Not at all  Social Connections: Socially Integrated (05/29/2024)   Social Connection and Isolation Panel    Frequency of Communication  with Friends and Family: More than three times a week    Frequency of Social Gatherings with Friends and Family: More than three times a week    Attends Religious Services: More than 4 times per year    Active Member of Clubs or Organizations: Yes    Attends Banker Meetings: More than 4 times per year    Marital Status: Married  Catering Manager Violence: Not At Risk (05/29/2024)   Epic    Fear of Current or Ex-Partner: No    Emotionally Abused: No    Physically Abused: No    Sexually Abused: No  Depression (PHQ2-9): Low Risk (05/29/2024)   Depression (PHQ2-9)    PHQ-2 Score: 0  Recent Concern: Depression (PHQ2-9) - Medium Risk (03/25/2024)   Depression (PHQ2-9)    PHQ-2 Score: 6  Alcohol Screen: Low Risk (05/29/2024)   Alcohol Screen    Last Alcohol Screening Score (AUDIT): 0  Housing: Unknown (05/29/2024)   Epic    Unable to Pay for Housing in the Last Year: No    Number of Times Moved in the Last Year: Not on file    Homeless in the Last Year: No  Utilities: Not At Risk (05/29/2024)   Epic    Threatened with loss of utilities: No  Health Literacy: Adequate Health Literacy (05/29/2024)   B1300 Health Literacy    Frequency of need for help with medical instructions: Never   Family History  Problem Relation Age of Onset   Colon cancer Mother 60   Heart attack Father    Heart disease Sister    Breast cancer Sister 82   Breast cancer Sister        dx 43s   Cancer Maternal Aunt 60       female cancer, unknown type   Cancer Maternal Grandmother        unknown type; dx 76s   Cancer Maternal Grandfather        unknown type; dx 76s   Cancer Cousin        maternal female cousin; dx 86s; unknown type   Cancer Cousin        maternal female cousin; dx 48s; unknown type   Cancer Cousin        maternal female cousin; dx after 48; unknown type   Esophageal cancer Neg Hx    Stomach cancer Neg Hx    Rectal cancer Neg Hx     Objective: Office vital signs  reviewed. BP 122/73   Pulse 66   Temp 98.2 F (36.8 C)   Ht 5' 1 (1.549 m)   Wt 252 lb 6 oz (114.5 kg)  SpO2 97%   BMI 47.69 kg/m   Physical Examination:  General: Awake, alert, morbidly obese female, No acute distress HEENT: sclera white.  No exophthalmos Cardio: regular rate and rhythm, S1S2 heard, no murmurs appreciated Pulm: clear to auscultation bilaterally, no wheezes, rhonchi or rales; normal work of breathing on room air Neuro: No focal neurologic deficits.  Ambulating independently.  Assessment/ Plan: 73 y.o. female   Obesity, Class III, BMI 40-49.9 (morbid obesity) (HCC) - Plan: CMP14+EGFR, Lipid Panel, Bayer DCA Hb A1c Waived, VITAMIN D  25 Hydroxy (Vit-D Deficiency, Fractures), tirzepatide  (ZEPBOUND ) 2.5 MG/0.5ML Pen, tirzepatide  (ZEPBOUND ) 5 MG/0.5ML Pen, tirzepatide  (ZEPBOUND ) 7.5 MG/0.5ML Pen  Acquired hypothyroidism - Plan: CMP14+EGFR, TSH + free T4, levothyroxine  (SYNTHROID ) 88 MCG tablet, CANCELED: TSH + free T4  Pituitary adenoma (HCC) - Plan: CMP14+EGFR  Moderate obstructive sleep apnea - Plan: tirzepatide  (ZEPBOUND ) 2.5 MG/0.5ML Pen, tirzepatide  (ZEPBOUND ) 5 MG/0.5ML Pen, tirzepatide  (ZEPBOUND ) 7.5 MG/0.5ML Pen  Need for influenza vaccination - Plan: Flu vaccine HIGH DOSE PF(Fluzone Trivalent)    Zepbound  reordered.  Patient with morbid obesity with BMI over 47 along with obstructive sleep apnea, moderate.  She has no clinical contraindications to utilization of GLP or GIP class of medications  I updated her Synthroid  to reflect the dose that was prescribed by endocrinology.  Thyroid  levels will be collected and I will CC him these results as we could not find his orders in the chart.  Pituitary adenoma incidentally found on MRI.  Continue to follow-up as directed with neurosurgery though I do not anticipate any interventions at this time  Influenza vaccination administered during today's visit  Patient's BMI is >30 mg/m2.  Patient's current BMI is Body  mass index is 47.69 kg/m.SABRA  Patient is currently enrolled in a healthy eating plan along with encouraged exercise.  Patient has contraindications to phentermine , Contrave & Qsymia (contains phentermine ).  Patient does not have a personal or family history of medullary thyroid  carcinoma (MTC) or Multiple Endocrine Neoplasia syndrome type 2 (MEN 2).  She has known moderate obstructive sleep apnea  Follow-up in 3 months, sooner if concerns arise  Norene CHRISTELLA Fielding, DO [facility] [phone]     [1]  Allergies Allergen Reactions   Tramadol  Other (See Comments)    Unsure of reaction  [2]  Current Outpatient Medications:    acetaminophen  (TYLENOL ) 500 MG tablet, Take 1,000 mg by mouth as needed (pain.)., Disp: , Rfl:    albuterol  (VENTOLIN  HFA) 108 (90 Base) MCG/ACT inhaler, Inhale 2 puffs into the lungs every 6 (six) hours as needed for wheezing or shortness of breath., Disp: 6.7 g, Rfl: 1   benzonatate  (TESSALON  PERLES) 100 MG capsule, Take 1 capsule (100 mg total) by mouth 3 (three) times daily as needed for cough. (Patient not taking: Reported on 05/29/2024), Disp: 20 capsule, Rfl: 0   budesonide -formoterol  (SYMBICORT ) 80-4.5 MCG/ACT inhaler, Inhale 2 puffs into the lungs 2 (two) times daily., Disp: 3 each, Rfl: 11   busPIRone  (BUSPAR ) 7.5 MG tablet, Take 1 tablet (7.5 mg total) by mouth 2 (two) times daily., Disp: 180 tablet, Rfl: 3   Calcium  Carbonate-Vit D-Min (CALCIUM  1200 PO), Take 1 tablet by mouth in the morning., Disp: , Rfl:    Cholecalciferol  (VITAMIN D3) 2000 units TABS, Take 2,000 Units by mouth in the morning., Disp: , Rfl:    clobetasol  ointment (TEMOVATE ) 0.05 %, Apply 1 Application topically 3 (three) times a week. Apply cream to the area where you have had itching 3 times  a week at night., Disp: 30 g, Rfl: 0   clotrimazole -betamethasone  (LOTRISONE ) cream, Apply 1 Application topically 2 (two) times daily as needed (itchy rash on vagina for max 10  days per flare up). Apply a pea sized amount and rub in well, Disp: 30 g, Rfl: 0   DULoxetine  (CYMBALTA ) 30 MG capsule, Take 2 capsules by mouth once daily, Disp: 180 capsule, Rfl: 1   estradiol  (ESTRACE ) 0.1 MG/GM vaginal cream, Place 0.5-1g nightly for 2 weeks, then twice a week after (Patient not taking: Reported on 05/29/2024), Disp: 30 g, Rfl: 3   furosemide  (LASIX ) 20 MG tablet, TAKE 1 TABLET BY MOUTH DAILY AS  NEEDED FOR FLUID, Disp: 100 tablet, Rfl: 0   HYDROcodone  bit-homatropine (HYCODAN) 5-1.5 MG/5ML syrup, Take 2.5 mLs by mouth every 6 (six) hours as needed for cough. (Patient not taking: Reported on 05/29/2024), Disp: 120 mL, Rfl: 0   levothyroxine  (SYNTHROID ) 75 MCG tablet, Take 1 tablet (75 mcg total) by mouth daily before breakfast., Disp: 90 tablet, Rfl: 3   lidocaine  (XYLOCAINE ) 5 % ointment, Apply 1 Application topically as needed. Up to 3 times a day, Disp: 35.44 g, Rfl: 0   losartan -hydrochlorothiazide  (HYZAAR) 100-12.5 MG tablet, Take 1 tablet by mouth daily., Disp: 100 tablet, Rfl: 0   Omega-3 Fatty Acids (FISH OIL) 1000 MG CAPS, Take 1,000 mg by mouth in the morning., Disp: , Rfl:    pantoprazole  (PROTONIX ) 40 MG tablet, Take 1 tablet (40 mg total) by mouth 2 (two) times daily before a meal., Disp: 200 tablet, Rfl: 3   tirzepatide  (ZEPBOUND ) 2.5 MG/0.5ML Pen, Inject 2.5 mg into the skin once a week. Month #1, Disp: 2 mL, Rfl: 0   tirzepatide  (ZEPBOUND ) 5 MG/0.5ML Pen, Inject 5 mg into the skin once a week. Month #2, Disp: 2 mL, Rfl: 0   tirzepatide  (ZEPBOUND ) 7.5 MG/0.5ML Pen, Inject 7.5 mg into the skin once a week. Month #3, Disp: 2 mL, Rfl: 0   Trospium  Chloride 60 MG CP24, Take 1 capsule (60 mg total) by mouth daily. (Patient not taking: Reported on 05/29/2024), Disp: 30 capsule, Rfl: 6   valACYclovir  (VALTREX ) 1000 MG tablet, TAKE 1 TABLET BY MOUTH TWICE DAILY FOR 7 DAYS. IF YOU HAVE ANOTHER FLARE, TAKE 2 TABLETS TWICE DAILY FOR 1 DAY ONLY PER FLAREUP, Disp: 30 tablet, Rfl:  2   vitamin B-12 (CYANOCOBALAMIN) 1000 MCG tablet, Take 1,000 mcg by mouth in the morning., Disp: , Rfl:

## 2024-07-28 ENCOUNTER — Ambulatory Visit: Payer: Self-pay | Admitting: Family Medicine

## 2024-07-28 LAB — LIPID PANEL
Chol/HDL Ratio: 3.1 ratio (ref 0.0–4.4)
Cholesterol, Total: 171 mg/dL (ref 100–199)
HDL: 55 mg/dL (ref >39)
LDL Chol Calc (NIH): 88 mg/dL (ref 0–99)
Triglycerides: 165 mg/dL — ABNORMAL HIGH (ref 0–149)
VLDL Cholesterol Cal: 28 mg/dL (ref 5–40)

## 2024-07-28 LAB — CMP14+EGFR
ALT: 20 IU/L (ref 0–32)
AST: 15 IU/L (ref 0–40)
Albumin: 4 g/dL (ref 3.8–4.8)
Alkaline Phosphatase: 99 IU/L (ref 49–135)
BUN/Creatinine Ratio: 29 — ABNORMAL HIGH (ref 12–28)
BUN: 19 mg/dL (ref 8–27)
Bilirubin Total: <0.2 mg/dL (ref 0.0–1.2)
CO2: 28 mmol/L (ref 20–29)
Calcium: 9.2 mg/dL (ref 8.7–10.3)
Chloride: 105 mmol/L (ref 96–106)
Creatinine, Ser: 0.65 mg/dL (ref 0.57–1.00)
Globulin, Total: 2.3 g/dL (ref 1.5–4.5)
Glucose: 82 mg/dL (ref 70–99)
Potassium: 4.2 mmol/L (ref 3.5–5.2)
Sodium: 143 mmol/L (ref 134–144)
Total Protein: 6.3 g/dL (ref 6.0–8.5)
eGFR: 92 mL/min/1.73 (ref >59)

## 2024-07-28 LAB — TSH+FREE T4
Free T4: 0.87 ng/dL (ref 0.82–1.77)
TSH: 9.84 u[IU]/mL — ABNORMAL HIGH (ref 0.450–4.500)

## 2024-07-28 LAB — VITAMIN D 25 HYDROXY (VIT D DEFICIENCY, FRACTURES): Vit D, 25-Hydroxy: 28.6 ng/mL — ABNORMAL LOW (ref 30.0–100.0)

## 2024-08-11 ENCOUNTER — Encounter: Payer: Self-pay | Admitting: Obstetrics

## 2024-08-11 ENCOUNTER — Ambulatory Visit: Admitting: Obstetrics

## 2024-08-11 VITALS — BP 144/69 | HR 66

## 2024-08-11 DIAGNOSIS — N3946 Mixed incontinence: Secondary | ICD-10-CM

## 2024-08-11 NOTE — Progress Notes (Signed)
 Interstim Basic Evaluation (PNE) Procedure   Procedure: Sacral Neuromodulator percutaneous sacral stimulation test using bony landmarks. Procedure repeated contralaterally for bilateral test lead placement.    Indication:73yo F with diagnosis of refractory overactive bladder. Informed consent has been obtained.   S/p urethral bulking and bladder botox  injection 09/05/23.  Trospium  ran out last Monday. UUI managed by 4-5 pads/day on Trospium , now back to 5-6 pads/day around 36 hours after discontinuation of Trospium   Uses 3-4 pads/day on Trospium  with Gemtesa , however reports heavier pad weights   Baseline 5-6 pads per day.   Cutout coffee Leaks continuously per patient, urgency > stress Patient is bothered by UI symptoms. Referral sent to medical weight management, not covered by insurance per pt. Desires to proceed with mediterranean diet.    Day time voids 10-12.  Nocturia: 0-2 down from 2-4x/night to void  Using estradiol  every 3 days Denies vaginal burning or discharge   Tried Kegels, pelvic floor PT with some relief however discontinued due to cost and left sided PFM tension on exam, PTNS (no relief), and Toviaz (relief but cost prohibitive), Ditropan  with relief, mirabegron  medications with some relief, and gemtesa  alone with no relief.  Reports loose stool attributed to diet around the holidays, denies FI. Denies fiber supplementation. Bowel movements improved since stopping oxybutynin .     Description:  Patient was properly identified and placed in the prone position. Patient was positioned to allow the toes to dangle freely. A grounding pad was placed on the bottom of the patient's foot and the cable was connected  to the grounding pad and external stimulation box. The patient was then prepped and draped in the usual sterile fashion. 9cm was measured from the tip of the coccyx superiorly in the midline, then 2cm superiorly and 2cm laterally to mark the foramen bilaterally. Local  injection of 20ml 1% lidocaine  with epinephrine  with 2ml of sodium bicarbonate was injected at the planned entry site bilaterally.    The foramen needle was placed at a 60 degree angle into the right S3 foramen without difficulty. The proper S3 position was confirmed by the patient's sensation to stimulation utilizing the external test stimulator. This was repeated on the left side. The needle stylet was removed and the percutaneous lead was inserted. Using a push and pull technique, the needle was taken out over the lead. This was then repeated on the opposite side.    The patient cables were connected to the leads and grounding pads. The patient was cleaned off and the entire region was covered with tegaderms to secure the leads. The estimated blood loss was negligible. The patient tolerated the procedure well with no complications. Using the external test stimulator, the patient was programmed to the optimum sensation via the lead, and given instructions. Urinary diary will be kept until the return appointment. She was given post-procedure instructions and precautions to call the office with any concerns.    Return 1 week for follow up and lead removal.     Lianne ONEIDA Gillis, MD

## 2024-08-11 NOTE — Patient Instructions (Addendum)
 Taking Care of Yourself after Percutaneous Nerve Evaluation     For the first 1-2 days after the procedure, you may have some discomfort or discharge from lead placement. Excessive bleeding or abnormal discharge is NOT normal. Call the nurse line if this happens or go to the nearest Emergency Room if the bleeding is heavy or if there are signs and symptoms of infection.    You may return to normal daily activities such as work, school, driving, exercising and housework on the day of the procedure. Please do not shower or bath while lead is in place.   Please return with completed bladder diary

## 2024-08-17 ENCOUNTER — Encounter: Payer: Self-pay | Admitting: Obstetrics and Gynecology

## 2024-08-17 ENCOUNTER — Ambulatory Visit: Admitting: Obstetrics and Gynecology

## 2024-08-17 VITALS — BP 106/74 | HR 82

## 2024-08-17 DIAGNOSIS — N3281 Overactive bladder: Secondary | ICD-10-CM | POA: Diagnosis not present

## 2024-08-17 DIAGNOSIS — N3946 Mixed incontinence: Secondary | ICD-10-CM

## 2024-08-17 DIAGNOSIS — K5909 Other constipation: Secondary | ICD-10-CM

## 2024-08-17 NOTE — Patient Instructions (Signed)
 Surgery to be scheduled for January 23rd. Surgery to start at 9am and you will need to arrive at 7:15am at [facility].

## 2024-08-17 NOTE — Progress Notes (Signed)
 Stacie Russell  SUBJECTIVE  History of Present Illness: Stacie Russell is a 74 y.o. female seen in follow-up for PNE.   Endorses improved bowel function.   Bladder function: Endorses baseline constant leakage  Reports pad use was around 4 per day during trial.   States overall improvement at 60%.   Patient reports on day 3 she had the representative switch stimulation side and was able to go 2-3 hours some days without going.   Past Medical History: Patient  has a past medical history of Arthritis, Asthma, Diverticulosis, Dyspnea, Endometrial hyperplasia, Esophageal stricture, Family history of breast cancer (01/26/2021), Foot pain, Fundic gland polyps of stomach, benign (07/10/2015), Gastritis, GERD (gastroesophageal reflux disease), Hemorrhoid, Hiatal hernia, Hip pain, History of kidney stones, History of surgery on arm, Hyperlipidemia, Hypertension, Hypothyroidism, Knee pain, Metabolic syndrome, Obstructive sleep apnea (08/27/2017), Oxygen deficiency, Pituitary tumor, PONV (postoperative nausea and vomiting), Spinal headache, Tendonitis of ankle, and Vitamin D  deficiency.   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   Medications: She has a current medication list which includes the following prescription(s): acetaminophen , albuterol , budesonide -formoterol , buspirone ,  calcium  carbonate-vit d-min, vitamin d3, clobetasol  ointment, clotrimazole -betamethasone , duloxetine , estradiol , furosemide , levothyroxine , lidocaine , losartan -hydrochlorothiazide , fish oil, pantoprazole , zepbound , zepbound , zepbound , valacyclovir , and cyanocobalamin.   Allergies: Patient is allergic to tramadol .   Social History: Patient  reports that she has never smoked. She has never used smokeless tobacco. She reports that she does not drink alcohol and does not use drugs.     OBJECTIVE      Physical Exam: Vitals:   08/17/24 1047  BP: 106/74  Pulse: 82   Gen: No apparent distress, A&O x 3.  Detailed Urogynecologic Evaluation:  Deferred.  PNE leads removed without difficulty. No active bleeding, infection, or other noted issue at insertion site. Patient tolerated procedure well.     ASSESSMENT AND PLAN    [name] is a 74 y.o. with:  1. Other constipation   2. 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 Patient reports she had smoother and easier bowel movements overall.  Patient's toal OAB leakage was decreased by 60% based on diary inputs listed above and her overall number of voids was decreased by 57%, which is consistent with patient's vocalized account of approximately 60% overall improvement.  Patient to proceed with SNM implantation.   Patient to follow up for SNM implantation.   Stacie Housley G River Mckercher, NP

## 2024-08-18 ENCOUNTER — Inpatient Hospital Stay: Attending: Gynecologic Oncology | Admitting: Gynecologic Oncology

## 2024-08-18 VITALS — BP 141/54 | HR 74 | Temp 98.1°F | Resp 18 | Wt 253.0 lb

## 2024-08-18 DIAGNOSIS — Z8542 Personal history of malignant neoplasm of other parts of uterus: Secondary | ICD-10-CM | POA: Insufficient documentation

## 2024-08-18 DIAGNOSIS — Z9079 Acquired absence of other genital organ(s): Secondary | ICD-10-CM | POA: Diagnosis not present

## 2024-08-18 DIAGNOSIS — Z90722 Acquired absence of ovaries, bilateral: Secondary | ICD-10-CM | POA: Insufficient documentation

## 2024-08-18 DIAGNOSIS — Z90710 Acquired absence of both cervix and uterus: Secondary | ICD-10-CM | POA: Insufficient documentation

## 2024-08-18 DIAGNOSIS — C541 Malignant neoplasm of endometrium: Secondary | ICD-10-CM

## 2024-08-18 LAB — HM MAMMOGRAPHY

## 2024-08-18 NOTE — Progress Notes (Signed)
 Gynecologic Oncology Return Clinic Visit  08/18/2024  Reason for Visit: surveillance in the setting of high risk uterine cancer   Treatment History: Oncology History Overview Note  MSI stable, endometrioid grade 2 Neg genetics   Endometrial cancer (HCC)  12/19/2020 Initial Diagnosis   Endometrial cancer (HCC)   12/26/2020 Imaging   1. Lower uterine segment/cervical mass without evidence of metastatic disease. 2. Hepatic steatosis. 3. Aortic atherosclerosis (ICD10-I70.0). Coronary artery calcification.   12/29/2020 Pathology Results   FINAL MICROSCOPIC DIAGNOSIS:   A. SENTINEL LYMPH NODE, RIGHT OBTURATOR, EXCISION:  -  No carcinoma identified in one lymph node (0/1)  -  See comment   B. SENTINEL LYMPH NODE, LEFT OBTURATOR, EXCISION:  -  Micrometastasis involving one lymph node (54mi/1)  -  See comment   C. UTERUS, CERVIX, BILATERAL TUBES AND OVARIES:  Uterus:  -  Endometrioid carcinoma, FIGO grade 2  -  See oncology table and comment below   Cervix:  -  No carcinoma identified   Bilateral Ovaries:  -  No carcinoma identified   Bilateral Fallopian tubes:  -  No carcinoma identified   ONCOLOGY TABLE:   UTERUS, CARCINOMA OR CARCINOSARCOMA: Resection   Procedure: Total hysterectomy and bilateral salpingo-oophorectomy  Histologic Type: Endometrioid carcinoma  Histologic Grade: FIGO grade 2  Myometrial Invasion:       Percentage of Myometrial Invasion: Estimated to be less than 50%  Uterine Serosa Involvement: Not identified  Cervical stromal Involvement: Not identified  Extent of involvement of other tissue/organs: Not identified  Peritoneal/Ascitic Fluid: Not submitted/unknown  Lymphovascular Invasion: Not identified  Regional Lymph Nodes:       Pelvic Lymph Nodes Examined:  2 Sentinel                                0 non-sentinel                                   2 total       Pelvic Lymph Nodes with Metastasis: 0                           Macrometastasis: (>2.0  mm): 0                          Micrometastasis: (>0.2 mm and < 2.0 mm): 1                            Isolated Tumor Cells (<0.2 mm): 0                       Laterality of Lymph Node with Tumor: 0                             Extracapsular Extension: N/A  Distant Metastasis:       Distant Site(s) Involved: N/A  Pathologic Stage Classification (pTNM, AJCC 8th Edition): pT1a, pN96mi  Ancillary Studies: MMR / MSI testing will be ordered  Representative Tumor Block: C1  Comment(s): Cytokeratin AE1/3 was performed on the lymph nodes (parts a and B) and supports the presence of micrometastasis (part B only). Drs. Belvie and [facility] reviewed select parts of the case and agree  with the above diagnosis.   (v4.2.0.1)     12/29/2020 Surgery   Surgeon: Eloy Maurilio Fitch Pre-operative Diagnosis: endometrial cancer grade 3    Operation: Robotic-assisted laparoscopic total hysterectomy with bilateral salpingoophorectomy, SLN biopsy   Surgeon: Eloy Maurilio Fitch  Specimens: uterus, cervix, bilateral tubes and ovaries, right and left obturator SLN           01/22/2021 Cancer Staging   Staging form: Corpus Uteri - Carcinoma and Carcinosarcoma, AJCC 8th Edition - Pathologic stage from 01/22/2021: Stage IIIC1 (pT1a, pN22mi, cM0) - Signed by Lonn Hicks, MD on 01/22/2021 Stage prefix: Initial diagnosis   01/25/2021 Procedure   Successful placement of a right IJ approach Power Port with ultrasound and fluoroscopic guidance. The catheter is ready for use.   02/07/2021 - 05/19/2021 Chemotherapy   Patient is on Treatment Plan : UTERINE Carboplatin  AUC 6 / Paclitaxel  q21d     02/11/2021 Genetic Testing   Negative hereditary cancer genetic testing: no pathogenic variants detected in [facility] Common Hereditary Cancers +RNA Panel.  The report date is February 11, 2021.    The Common Hereditary Cancers + RNA Panel offered by [facility] includes sequencing, deletion/duplication, and RNA testing of the following 47 genes:  APC, ATM, AXIN2, BARD1, BMPR1A, BRCA1, BRCA2, BRIP1, CDH1, CDK4*, CDKN2A (p14ARF)*, CDKN2A (p16INK4a)*, CHEK2, CTNNA1, DICER1, EPCAM (Deletion/duplication testing only), GREM1 (promoter region deletion/duplication testing only), KIT, MEN1, MLH1, MSH2, MSH3, MSH6, MUTYH, NBN, NF1, NHTL1, PALB2, PDGFRA*, PMS2, POLD1, POLE, PTEN, RAD50, RAD51C, RAD51D, SDHB, SDHC, SDHD, SMAD4, SMARCA4. STK11, TP53, TSC1, TSC2, and VHL.  The following genes were evaluated for sequence changes only: SDHA and HOXB13 c.251G>A variant only.  RNA analysis is not performed for the * genes.     06/14/2021 Imaging   1. Interval hysterectomy and bilateral salpingo oophorectomy. No findings of residual/recurrent malignancy. 2. Substantial lumbar spondylosis and degenerative disc disease. In addition, a right eccentric intervertebral spur at T8-9 causes prominent central narrowing of the thecal sac. 3. Other imaging findings of potential clinical significance: Aortic Atherosclerosis (ICD10-I70.0). Systemic atherosclerosis. Small type 1 hiatal hernia.     08/30/2022 Imaging   1. Status post hysterectomy without evidence of local recurrence. 2. No evidence of metastatic disease within the abdomen or pelvis. 3. Moderate volume of formed stool in the colon. Correlate for constipation. 4. Moderate-sized hiatal hernia. 5.  Aortic Atherosclerosis (ICD10-I70.0).   08/29/2023 Imaging   CT ABDOMEN PELVIS W CONTRAST Result Date: 09/05/2023 CLINICAL DATA:  Follow-up endometrial carcinoma. Status post chemotherapy. Restaging. * Tracking Code: BO * EXAM: CT ABDOMEN AND PELVIS WITH CONTRAST TECHNIQUE: Multidetector CT imaging of the abdomen and pelvis was performed using the standard protocol following bolus administration of intravenous contrast. RADIATION DOSE REDUCTION: This exam was performed according to the departmental dose-optimization program which includes automated exposure control, adjustment of the mA and/or kV according to patient  size and/or use of iterative reconstruction technique. CONTRAST:  OMNIPAQUE  IOHEXOL  300 MG/ML  SOLN COMPARISON:  08/28/2022 FINDINGS: Lower Chest: No acute findings. Hepatobiliary: No suspicious hepatic masses identified. Gallbladder is unremarkable. No evidence of biliary ductal dilatation. Pancreas:  No mass or inflammatory changes. Spleen: Within normal limits in size and appearance. Adrenals/Urinary Tract: No suspicious masses identified. No evidence of ureteral calculi or hydronephrosis. Tiny amount of gas noted in urinary bladder, presumably from recent bladder catheterization/instrumentation. Stomach/Bowel: Moderate size hiatal hernia again seen. No evidence of obstruction, inflammatory process or abnormal fluid collections. Vascular/Lymphatic: No pathologically enlarged lymph nodes. No acute vascular findings. Reproductive: Prior  hysterectomy noted. No mass seen involving the vaginal cuff. Adnexal regions are unremarkable in appearance. Other:  None. Musculoskeletal:  No suspicious bone lesions identified. IMPRESSION: No evidence of recurrent or metastatic disease within the abdomen or pelvis. Stable moderate hiatal hernia. Electronically Signed   By: Norleen DELENA Kil M.D.   On: 09/05/2023 10:46   [facility] MAMMOGRAPHY Negative - [facility]      10/09/2023 Procedure   Successful removal of implanted Port-A-Cath.       Interval History: The patient presents to the office today for continued follow up. She reports overall doing well since her last visit. She stayed busy during the holiday season.  She is tolerating her diet with no nausea, emesis, or appetite changes. No abdominal or pelvic pain reported. She has been seeing Dr. Guadlupe for her urinary issues. She has had some improvement after PNE trial. This also helped with her bowels. She continues to have incontinence, more at night. Previously she was going to void around 24 times a day and now around 14 times.  No vaginal bleeding or discharge  reported. The vulvar itching reported at her last visit has improved with no symptoms reported today. Has mild lower extremity edema and has seen a vascular specialist. 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 She also has spinal stenosis and is thinking about having a second opinion in regards to this. At the end of the day, she is stiff and has decreased mobility. Of note, she had her mammogram earlier today. No symptoms concerning for recurrence voiced.  Past Medical/Surgical History: Past Medical History:  Diagnosis Date   Arthritis    Asthma    Diverticulosis    Dyspnea    with exertion    Endometrial hyperplasia    Esophageal stricture    Family history of breast cancer 01/26/2021   Foot pain    Fundic gland polyps of stomach, benign 07/10/2015   Gastritis    GERD (gastroesophageal reflux disease)    Hemorrhoid    Hiatal hernia    Hip pain    History of kidney stones    cystoscopy   History of surgery on arm    left humerus   Hyperlipidemia    Hypertension    Hypothyroidism    Knee pain    Metabolic syndrome    Obstructive sleep apnea 08/27/2017   pt denies    Oxygen deficiency    pt denies   Pituitary tumor    PONV (postoperative nausea and vomiting)    Spinal headache    few headaches recently    Tendonitis of ankle    Vitamin D  deficiency     Past Surgical History:  Procedure Laterality Date   ANAL FISSURE REPAIR     CATARACT EXTRACTION  May 2009   COLONOSCOPY  12/26/07   ESOPHAGEAL DILATION     ESOPHAGOGASTRODUODENOSCOPY  12/26/07, 05/13/08   EYE SURGERY Bilateral    bilateral cataract with lens implants   IR IMAGING GUIDED PORT INSERTION  01/25/2021   IR REMOVAL TUN ACCESS W/ PORT W/O FL MOD SED  10/08/2023   KNEE CARTILAGE SURGERY Right Sept 1999   ORIF HUMERUS FRACTURE Left 01/12/2020   Procedure: OPEN REDUCTION INTERNAL FIXATION (ORIF) LEFT HUMERUS FRACTURE;  Surgeon: Camella Fallow, MD;   Location: [facility];  Service: Orthopedics;  Laterality: Left;   ROBOTIC ASSISTED TOTAL HYSTERECTOMY WITH BILATERAL SALPINGO OOPHERECTOMY Bilateral 12/29/2020   Procedure: XI ROBOTIC ASSISTED TOTAL HYSTERECTOMY  WITH BILATERAL SALPINGO OOPHORECTOMY;  Surgeon: Eloy Herring, MD;  Location: [facility];  Service: Gynecology;  Laterality: Bilateral;   SENTINEL NODE BIOPSY N/A 12/29/2020   Procedure: SENTINEL NODE BIOPSY;  Surgeon: Eloy Herring, MD;  Location: [facility];  Service: Gynecology;  Laterality: N/A;   TOTAL KNEE ARTHROPLASTY Right 10/07/2017   Procedure: RIGHT TOTAL KNEE ARTHROPLASTY;  Surgeon: Melodi Lerner, MD;  Location: [facility];  Service: Orthopedics;  Laterality: Right;  Adductor Block   UMBILICAL HERNIA REPAIR  05/20/06    Family History  Problem Relation Age of Onset   Colon cancer Mother 84   Heart attack Father    Heart disease Sister    Breast cancer Sister 71   Breast cancer Sister        dx 55s   Cancer Maternal Aunt 2       female cancer, unknown type   Cancer Maternal Grandmother        unknown type; dx 45s   Cancer Maternal Grandfather        unknown type; dx 49s   Cancer Cousin        maternal female cousin; dx 35s; unknown type   Cancer Cousin        maternal female cousin; dx 48s; unknown type   Cancer Cousin        maternal female cousin; dx after 64; unknown type   Esophageal cancer Neg Hx    Stomach cancer Neg Hx    Rectal cancer Neg Hx     Social History   Socioeconomic History   Marital status: Married    Spouse name: Debby   Number of children: 2   Years of education: 14   Highest education level: Associate degree: academic program  Occupational History   Occupation: Retired  Tobacco Use   Smoking status: Never   Smokeless tobacco: Never  Vaping Use   Vaping status: Never Used  Substance and Sexual Activity   Alcohol use: No   Drug use: No   Sexual activity: Not Currently    Birth control/protection: Post-menopausal  Other Topics Concern   Not on  file  Social History Narrative   Married and retired   No EtOH, tobacco or drug use   Social Drivers of Health   Tobacco Use: Low Risk (08/17/2024)   Patient History    Smoking Tobacco Use: Never    Smokeless Tobacco Use: Never    Passive Exposure: Not on file  Financial Resource Strain: Low Risk (05/29/2024)   Overall Financial Resource Strain (CARDIA)    Difficulty of Paying Living Expenses: Not hard at all  Food Insecurity: No Food Insecurity (05/29/2024)   Epic    Worried About Programme Researcher, Broadcasting/film/video in the Last Year: Never true    Ran Out of Food in the Last Year: Never true  Transportation Needs: No Transportation Needs (05/29/2024)   Epic    Lack of Transportation (Medical): No    Lack of Transportation (Non-Medical): No  Physical Activity: Inactive (05/29/2024)   Exercise Vital Sign    Days of Exercise per Week: 0 days    Minutes of Exercise per Session: 0 min  Stress: No Stress Concern Present (05/29/2024)   [employer] of Occupational Health - Occupational Stress Questionnaire    Feeling of Stress: Not at all  Social Connections: Socially Integrated (05/29/2024)   Social Connection and Isolation Panel    Frequency of Communication with Friends and Family: More than three times a week  Frequency of Social Gatherings with Friends and Family: More than three times a week    Attends Religious Services: More than 4 times per year    Active Member of Clubs or Organizations: Yes    Attends Banker Meetings: More than 4 times per year    Marital Status: Married  Depression (PHQ2-9): Medium Risk (07/27/2024)   Depression (PHQ2-9)    PHQ-2 Score: 8  Alcohol Screen: Low Risk (05/29/2024)   Alcohol Screen    Last Alcohol Screening Score (AUDIT): 0  Housing: Unknown (05/29/2024)   Epic    Unable to Pay for Housing in the Last Year: No    Number of Times Moved in the Last Year: Not on file    Homeless in the Last Year: No  Utilities: Not At Risk  (05/29/2024)   Epic    Threatened with loss of utilities: No  Health Literacy: Adequate Health Literacy (05/29/2024)   B1300 Health Literacy    Frequency of need for help with medical instructions: Never    Current Medications:  Current Outpatient Medications:    acetaminophen  (TYLENOL ) 500 MG tablet, Take 1,000 mg by mouth as needed (pain.)., Disp: , Rfl:    albuterol  (VENTOLIN  HFA) 108 (90 Base) MCG/ACT inhaler, Inhale 2 puffs into the lungs every 6 (six) hours as needed for wheezing or shortness of breath., Disp: 6.7 g, Rfl: 1   budesonide -formoterol  (SYMBICORT ) 80-4.5 MCG/ACT inhaler, Inhale 2 puffs into the lungs 2 (two) times daily., Disp: 3 each, Rfl: 11   busPIRone  (BUSPAR ) 7.5 MG tablet, Take 1 tablet (7.5 mg total) by mouth 2 (two) times daily., Disp: 180 tablet, Rfl: 3   Calcium  Carbonate-Vit D-Min (CALCIUM  1200 PO), Take 1 tablet by mouth in the morning., Disp: , Rfl:    Cholecalciferol  (VITAMIN D3) 2000 units TABS, Take 2,000 Units by mouth in the morning., Disp: , Rfl:    clobetasol  ointment (TEMOVATE ) 0.05 %, Apply 1 Application topically 3 (three) times a week. Apply cream to the area where you have had itching 3 times a week at night., Disp: 30 g, Rfl: 0   clotrimazole -betamethasone  (LOTRISONE ) cream, Apply 1 Application topically 2 (two) times daily as needed (itchy rash on vagina for max 10 days per flare up). Apply a pea sized amount and rub in well, Disp: 30 g, Rfl: 0   DULoxetine  (CYMBALTA ) 30 MG capsule, Take 2 capsules by mouth once daily, Disp: 180 capsule, Rfl: 1   estradiol  (ESTRACE ) 0.1 MG/GM vaginal cream, Place 0.5-1g nightly for 2 weeks, then twice a week after, Disp: 30 g, Rfl: 3   furosemide  (LASIX ) 20 MG tablet, TAKE 1 TABLET BY MOUTH DAILY AS  NEEDED FOR FLUID, Disp: 100 tablet, Rfl: 0   levothyroxine  (SYNTHROID ) 88 MCG tablet, Take 1 tablet (88 mcg total) by mouth daily before breakfast. Per Dr Faythe, Margarete, Disp: , Rfl:    lidocaine  (XYLOCAINE ) 5 % ointment,  Apply 1 Application topically as needed. Up to 3 times a day, Disp: 35.44 g, Rfl: 0   losartan -hydrochlorothiazide  (HYZAAR) 100-12.5 MG tablet, Take 1 tablet by mouth daily., Disp: 100 tablet, Rfl: 0   Omega-3 Fatty Acids (FISH OIL) 1000 MG CAPS, Take 1,000 mg by mouth in the morning., Disp: , Rfl:    pantoprazole  (PROTONIX ) 40 MG tablet, Take 1 tablet (40 mg total) by mouth 2 (two) times daily before a meal., Disp: 200 tablet, Rfl: 3   tirzepatide  (ZEPBOUND ) 2.5 MG/0.5ML Pen, Inject 2.5 mg into the skin once  a week. Month #1, Disp: 2 mL, Rfl: 0   tirzepatide  (ZEPBOUND ) 5 MG/0.5ML Pen, Inject 5 mg into the skin once a week. Month #2, Disp: 2 mL, Rfl: 0   tirzepatide  (ZEPBOUND ) 7.5 MG/0.5ML Pen, Inject 7.5 mg into the skin once a week. Month #3, Disp: 2 mL, Rfl: 0   valACYclovir  (VALTREX ) 1000 MG tablet, TAKE 1 TABLET BY MOUTH TWICE DAILY FOR 7 DAYS. IF YOU HAVE ANOTHER FLARE, TAKE 2 TABLETS TWICE DAILY FOR 1 DAY ONLY PER FLAREUP, Disp: 30 tablet, Rfl: 2   vitamin B-12 (CYANOCOBALAMIN) 1000 MCG tablet, Take 1,000 mcg by mouth in the morning., Disp: , Rfl:   Review of Systems: See interval.  Physical Exam: BP (!) 141/54 (BP Location: Left Arm, Patient Position: Sitting)   Pulse 74   Temp 98.1 F (36.7 C) (Oral)   Resp 18   Wt 253 lb (114.8 kg)   SpO2 96%   BMI 47.80 kg/m  General: Alert, oriented, no acute distress. HEENT: Normocephalic, atraumatic, sclera anicteric.  Chest: Clear to auscultation bilaterally.  No wheezes or rhonchi. Cardiovascular: Regular rate and rhythm, no murmurs. Abdomen: Obese, soft, nontender.  Normoactive bowel sounds.  No masses or hepatosplenomegaly appreciated.  Well-healed incisions without nodularity. Extremities: Grossly normal range of motion.  Warm, well perfused.  Mild non pitting edema bilaterally. Skin: Skin lesion on upper aspect of back assessed. Skin appears irritated. Lymphatics: No cervical, supraclavicular, or inguinal adenopathy. GU: Normal  appearing external genitalia without erythema, excoriation, or lesions.  No visible lesions along the left labia majora.  Speculum exam reveals moderately atrophic vaginal mucosa, no lesions or masses, no bleeding or discharge noted.  Laxity of the vaginal wall noted.  Bimanual exam reveals cuff and vaginal mucosa smooth, no nodularity. Confirmed on rectal exam.  Laboratory & Radiologic Studies: 07/27/2024 labs including TSH, Vit D, Lipid panel, CMP, A1C 06/21/2024: CT head and MR brain  Assessment & Plan: Stacie Russell is a 74 y.o. 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  Completed adjuvant chemotherapy in 05/2021.  Posttreatment imaging shows no evidence of disease. Imaging 08/29/23 negative for recurrent disease.   Patient is overall doing well and is NED on exam today.  She will continue to follow with Dr. Guadlupe and has insertion of a sacral neurostimulator implantation scheduled for 09/04/2024. In regards to upper back skin irritation, advised to monitor and contact her dermatologist if this persists for possible biopsy.   Per NCCN surveillance recommendations, we will see her back in 6 months for surveillance visit.  Discussed signs and symptoms that would be concerning for cancer recurrence and stressed the importance of calling if she develops any of these. She has follow up with Dr. Lonn scheduled for the end of January 2026.     20 minutes of total time was spent for this patient encounter, including preparation, face-to-face counseling with the patient and coordination of care, and documentation of the encounter.  Eleanor Epps NP [facility]

## 2024-08-18 NOTE — Patient Instructions (Signed)
 It was good to see you today.  I do not see or feel any evidence of cancer recurrence on your exam.   We will see you for follow-up in 6 months.   As always, if you develop any new and concerning symptoms before your next visit, please call to see me sooner.  Symptoms to report to your health care team include vaginal bleeding, rectal bleeding, bloating, weight loss without effort, new and persistent pain, new and  persistent fatigue, new leg swelling, new masses (i.e., bumps in your neck or groin), new and persistent cough, new and persistent nausea and vomiting, change in bowel or bladder habits, and any other concerns.

## 2024-08-19 ENCOUNTER — Encounter: Payer: Self-pay | Admitting: Family Medicine

## 2024-08-24 ENCOUNTER — Encounter: Payer: Self-pay | Admitting: *Deleted

## 2024-09-01 ENCOUNTER — Telehealth: Payer: Self-pay

## 2024-09-01 ENCOUNTER — Encounter: Payer: Self-pay | Admitting: Obstetrics

## 2024-09-01 NOTE — Telephone Encounter (Signed)
 Patient called today with concerns of having surgery Friday, she is scheduled for insertion neurostimulator sacral. She complains of loose stools she cannot control, this started when she had the office test simulator in the office on 08-11-2024. She feels like having the permanent device placed will only continue her loss of control of bowels. She has not st started

## 2024-09-02 NOTE — Telephone Encounter (Signed)
 Patient is calling requesting a response.

## 2024-09-03 NOTE — Telephone Encounter (Signed)
 Called to review symptoms.  Pt reports feeling anxious regarding pending surgery for sacral neuromodulation due to new onset fecal incontinence.  Reports history of constipation with bowel movements every 3 days prior to PNE with straining and sensation of incomplete emptying managed by miralax  PRN. Experienced several episodes of FI during PNE and symptoms persist after temporary lead removal.  Denies blood in stool, fever, sick contacts, or change in diet.  History of loose stool around the holidays attributed to diet. Now mostly Type IV stool 1-3x/day. Colonoscopy last 06/27/21.   Reviewed treatments of accidental bowel leakage. Treatment options include anti-diarrhea medication (loperamide/ Imodium OTC or prescription lomotil), fiber supplements, physical therapy, and sacral neuromodulation or surgery.   Encouraged titration of fiber supplementation. Patient reluctant to try imodium due to history of constipation.  Declined referral to pelvic floor PT and desires to reassess symptoms in 3 months. Encouraged to consider follow-up with GI if symptoms persist or worsens.

## 2024-09-04 ENCOUNTER — Encounter (HOSPITAL_COMMUNITY): Admission: RE | Payer: Self-pay | Source: Home / Self Care

## 2024-09-04 ENCOUNTER — Ambulatory Visit (HOSPITAL_COMMUNITY): Admission: RE | Admit: 2024-09-04 | Source: Home / Self Care | Admitting: Obstetrics

## 2024-09-04 SURGERY — INSERTION, NEUROSTIMULATOR, SACRAL
Anesthesia: Monitor Anesthesia Care

## 2024-09-08 ENCOUNTER — Inpatient Hospital Stay: Payer: HMO

## 2024-09-08 ENCOUNTER — Inpatient Hospital Stay: Payer: HMO | Admitting: Hematology and Oncology

## 2024-09-24 ENCOUNTER — Inpatient Hospital Stay: Payer: Self-pay | Admitting: Hematology and Oncology

## 2024-09-24 ENCOUNTER — Inpatient Hospital Stay: Payer: Self-pay | Attending: Gynecologic Oncology

## 2024-09-29 ENCOUNTER — Encounter: Admitting: Obstetrics

## 2024-10-26 ENCOUNTER — Ambulatory Visit: Admitting: Family Medicine

## 2024-11-02 ENCOUNTER — Ambulatory Visit

## 2025-02-19 ENCOUNTER — Inpatient Hospital Stay: Attending: Gynecologic Oncology | Admitting: Gynecologic Oncology

## 2025-06-01 ENCOUNTER — Ambulatory Visit
